# Patient Record
Sex: Male | Born: 1970 | Race: White | Hispanic: No | Marital: Married | Smoking: Current every day smoker
Health system: Southern US, Community
[De-identification: ages and names within clinical notes are randomized; demographics above are authoritative.]

## PROBLEM LIST (undated history)

## (undated) DIAGNOSIS — B1920 Unspecified viral hepatitis C without hepatic coma: Secondary | ICD-10-CM

## (undated) DIAGNOSIS — F1021 Alcohol dependence, in remission: Secondary | ICD-10-CM

## (undated) DIAGNOSIS — I4891 Unspecified atrial fibrillation: Secondary | ICD-10-CM

## (undated) DIAGNOSIS — I10 Essential (primary) hypertension: Secondary | ICD-10-CM

## (undated) DIAGNOSIS — F1010 Alcohol abuse, uncomplicated: Secondary | ICD-10-CM

## (undated) DIAGNOSIS — L299 Pruritus, unspecified: Secondary | ICD-10-CM

## (undated) DIAGNOSIS — R5383 Other fatigue: Secondary | ICD-10-CM

## (undated) DIAGNOSIS — J309 Allergic rhinitis, unspecified: Secondary | ICD-10-CM

## (undated) DIAGNOSIS — Z6824 Body mass index (BMI) 24.0-24.9, adult: Secondary | ICD-10-CM

## (undated) DIAGNOSIS — M549 Dorsalgia, unspecified: Secondary | ICD-10-CM

## (undated) DIAGNOSIS — N289 Disorder of kidney and ureter, unspecified: Secondary | ICD-10-CM

## (undated) HISTORY — DX: Body mass index (BMI) 24.0-24.9, adult: Z68.24

## (undated) HISTORY — DX: Unspecified atrial fibrillation: I48.91

## (undated) HISTORY — DX: Other fatigue: R53.83

## (undated) HISTORY — PX: MANDIBLE SURGERY: SHX707

## (undated) HISTORY — DX: Essential (primary) hypertension: I10

## (undated) HISTORY — DX: Alcohol dependence, in remission: F10.21

## (undated) HISTORY — DX: Pruritus, unspecified: L29.9

## (undated) HISTORY — DX: Allergic rhinitis, unspecified: J30.9

## (undated) HISTORY — DX: Unspecified viral hepatitis C without hepatic coma: B19.20

---

## 2003-10-11 ENCOUNTER — Ambulatory Visit (HOSPITAL_BASED_OUTPATIENT_CLINIC_OR_DEPARTMENT_OTHER): Admission: RE | Admit: 2003-10-11 | Discharge: 2003-10-11 | Payer: Self-pay | Admitting: Orthopedic Surgery

## 2012-04-20 ENCOUNTER — Emergency Department (HOSPITAL_COMMUNITY): Payer: Self-pay

## 2012-04-20 ENCOUNTER — Encounter (HOSPITAL_COMMUNITY): Payer: Self-pay | Admitting: *Deleted

## 2012-04-20 ENCOUNTER — Inpatient Hospital Stay (HOSPITAL_COMMUNITY)
Admission: EM | Admit: 2012-04-20 | Discharge: 2012-04-25 | DRG: 084 | Discharge status: Against Medical Advice | Payer: MEDICAID | Attending: General Surgery | Admitting: General Surgery

## 2012-04-20 DIAGNOSIS — F1410 Cocaine abuse, uncomplicated: Secondary | ICD-10-CM | POA: Diagnosis present

## 2012-04-20 DIAGNOSIS — S065XAA Traumatic subdural hemorrhage with loss of consciousness status unknown, initial encounter: Secondary | ICD-10-CM | POA: Diagnosis present

## 2012-04-20 DIAGNOSIS — S0240DA Maxillary fracture, left side, initial encounter for closed fracture: Secondary | ICD-10-CM | POA: Diagnosis present

## 2012-04-20 DIAGNOSIS — IMO0002 Reserved for concepts with insufficient information to code with codable children: Secondary | ICD-10-CM

## 2012-04-20 DIAGNOSIS — S02109A Fracture of base of skull, unspecified side, initial encounter for closed fracture: Secondary | ICD-10-CM | POA: Diagnosis present

## 2012-04-20 DIAGNOSIS — S06369A Traumatic hemorrhage of cerebrum, unspecified, with loss of consciousness of unspecified duration, initial encounter: Secondary | ICD-10-CM

## 2012-04-20 DIAGNOSIS — S065X9A Traumatic subdural hemorrhage with loss of consciousness of unspecified duration, initial encounter: Secondary | ICD-10-CM

## 2012-04-20 DIAGNOSIS — S0636AA Traumatic hemorrhage of cerebrum, unspecified, with loss of consciousness status unknown, initial encounter: Secondary | ICD-10-CM | POA: Diagnosis present

## 2012-04-20 DIAGNOSIS — W19XXXA Unspecified fall, initial encounter: Secondary | ICD-10-CM | POA: Diagnosis present

## 2012-04-20 DIAGNOSIS — S06309A Unspecified focal traumatic brain injury with loss of consciousness of unspecified duration, initial encounter: Principal | ICD-10-CM | POA: Diagnosis present

## 2012-04-20 DIAGNOSIS — S0280XA Fracture of other specified skull and facial bones, unspecified side, initial encounter for closed fracture: Secondary | ICD-10-CM | POA: Diagnosis present

## 2012-04-20 DIAGNOSIS — S0285XA Fracture of orbit, unspecified, initial encounter for closed fracture: Secondary | ICD-10-CM

## 2012-04-20 DIAGNOSIS — F1010 Alcohol abuse, uncomplicated: Secondary | ICD-10-CM | POA: Diagnosis present

## 2012-04-20 DIAGNOSIS — S066X9A Traumatic subarachnoid hemorrhage with loss of consciousness of unspecified duration, initial encounter: Secondary | ICD-10-CM

## 2012-04-20 DIAGNOSIS — S022XXA Fracture of nasal bones, initial encounter for closed fracture: Secondary | ICD-10-CM | POA: Diagnosis present

## 2012-04-20 HISTORY — DX: Alcohol abuse, uncomplicated: F10.10

## 2012-04-20 HISTORY — DX: Dorsalgia, unspecified: M54.9

## 2012-04-20 LAB — PROTIME-INR: Prothrombin Time: 13 seconds (ref 11.6–15.2)

## 2012-04-20 LAB — RAPID URINE DRUG SCREEN, HOSP PERFORMED
Benzodiazepines: NOT DETECTED
Cocaine: POSITIVE — AB
Opiates: NOT DETECTED

## 2012-04-20 LAB — CBC
HCT: 46 % (ref 39.0–52.0)
Hemoglobin: 16.1 g/dL (ref 13.0–17.0)
MCH: 30.3 pg (ref 26.0–34.0)
MCHC: 35 g/dL (ref 30.0–36.0)
MCV: 86.5 fL (ref 78.0–100.0)

## 2012-04-20 LAB — BASIC METABOLIC PANEL
BUN: 7 mg/dL (ref 6–23)
Calcium: 9.2 mg/dL (ref 8.4–10.5)
Creatinine, Ser: 1.04 mg/dL (ref 0.50–1.35)
GFR calc non Af Amer: 86 mL/min — ABNORMAL LOW (ref >90)
Glucose, Bld: 93 mg/dL (ref 70–99)

## 2012-04-20 LAB — ETHANOL: Alcohol, Ethyl (B): 127 mg/dL — ABNORMAL HIGH (ref 0–11)

## 2012-04-20 MED ORDER — VITAMIN B-1 100 MG PO TABS
100.0000 mg | ORAL_TABLET | Freq: Every day | ORAL | Status: DC
  Administered 2012-04-21 – 2012-04-25 (×5): 100 mg via ORAL
  Filled 2012-04-20 (×5): qty 1

## 2012-04-20 MED ORDER — ONDANSETRON HCL 4 MG PO TABS: 4.0000 mg | ORAL_TABLET | Freq: Four times a day (QID) | ORAL | Status: DC | PRN

## 2012-04-20 MED ORDER — LORAZEPAM 2 MG/ML IJ SOLN: 1.0000 mg | Freq: Four times a day (QID) | INTRAMUSCULAR | Status: AC | PRN

## 2012-04-20 MED ORDER — THIAMINE HCL 100 MG/ML IJ SOLN
100.0000 mg | Freq: Every day | INTRAMUSCULAR | Status: DC
  Filled 2012-04-20 (×2): qty 1

## 2012-04-20 MED ORDER — PANTOPRAZOLE SODIUM 40 MG IV SOLR
40.0000 mg | Freq: Every day | INTRAVENOUS | Status: DC
  Filled 2012-04-20 (×2): qty 40

## 2012-04-20 MED ORDER — ONDANSETRON HCL 4 MG/2ML IJ SOLN
4.0000 mg | Freq: Four times a day (QID) | INTRAMUSCULAR | Status: DC | PRN
  Administered 2012-04-21 – 2012-04-23 (×4): 4 mg via INTRAVENOUS
  Filled 2012-04-20 (×4): qty 2

## 2012-04-20 MED ORDER — HYDROMORPHONE HCL PF 1 MG/ML IJ SOLN
0.5000 mg | INTRAMUSCULAR | Status: DC | PRN
Start: 2012-04-20 — End: 2012-04-25
  Administered 2012-04-21 – 2012-04-24 (×2): 0.5 mg via INTRAVENOUS
  Filled 2012-04-20 (×2): qty 1

## 2012-04-20 MED ORDER — HYDROMORPHONE HCL PF 1 MG/ML IJ SOLN
1.0000 mg | INTRAMUSCULAR | Status: DC | PRN
  Administered 2012-04-20 – 2012-04-25 (×30): 1 mg via INTRAVENOUS
  Filled 2012-04-20 (×30): qty 1

## 2012-04-20 MED ORDER — LORAZEPAM 1 MG PO TABS
0.0000 mg | ORAL_TABLET | Freq: Four times a day (QID) | ORAL | Status: AC
  Administered 2012-04-21: 1 mg via ORAL

## 2012-04-20 MED ORDER — TETANUS-DIPHTH-ACELL PERTUSSIS 5-2.5-18.5 LF-MCG/0.5 IM SUSP
0.5000 mL | Freq: Once | INTRAMUSCULAR | Status: AC
  Administered 2012-04-20: 0.5 mL via INTRAMUSCULAR
  Filled 2012-04-20: qty 0.5

## 2012-04-20 MED ORDER — ADULT MULTIVITAMIN W/MINERALS CH
1.0000 | ORAL_TABLET | Freq: Every day | ORAL | Status: DC
  Administered 2012-04-21 – 2012-04-25 (×5): 1 via ORAL
  Filled 2012-04-20 (×5): qty 1

## 2012-04-20 MED ORDER — FENTANYL CITRATE 0.05 MG/ML IJ SOLN
50.0000 ug | Freq: Once | INTRAMUSCULAR | Status: AC
  Administered 2012-04-20: 50 ug via INTRAVENOUS
  Filled 2012-04-20: qty 2

## 2012-04-20 MED ORDER — POTASSIUM CHLORIDE IN NACL 20-0.9 MEQ/L-% IV SOLN
INTRAVENOUS | Status: DC
  Administered 2012-04-21 (×2): via INTRAVENOUS
  Administered 2012-04-21: 125 mL/h via INTRAVENOUS
  Administered 2012-04-22 – 2012-04-23 (×4): via INTRAVENOUS
  Filled 2012-04-20 (×9): qty 1000

## 2012-04-20 MED ORDER — SODIUM CHLORIDE 0.9 % IV BOLUS (SEPSIS)
1000.0000 mL | Freq: Once | INTRAVENOUS | Status: AC
  Administered 2012-04-20: 1000 mL via INTRAVENOUS

## 2012-04-20 MED ORDER — LORAZEPAM 1 MG PO TABS
1.0000 mg | ORAL_TABLET | Freq: Four times a day (QID) | ORAL | Status: AC | PRN
  Filled 2012-04-20: qty 1

## 2012-04-20 MED ORDER — LORAZEPAM 1 MG PO TABS
0.0000 mg | ORAL_TABLET | Freq: Two times a day (BID) | ORAL | Status: AC
  Administered 2012-04-23: 1 mg via ORAL
  Administered 2012-04-24: 2 mg via ORAL
  Filled 2012-04-20: qty 2
  Filled 2012-04-20: qty 1

## 2012-04-20 MED ORDER — PANTOPRAZOLE SODIUM 40 MG PO TBEC
40.0000 mg | DELAYED_RELEASE_TABLET | Freq: Every day | ORAL | Status: DC
  Administered 2012-04-21 – 2012-04-25 (×5): 40 mg via ORAL
  Filled 2012-04-20 (×5): qty 1

## 2012-04-20 MED ORDER — FOLIC ACID 1 MG PO TABS
1.0000 mg | ORAL_TABLET | Freq: Every day | ORAL | Status: DC
  Administered 2012-04-21 – 2012-04-25 (×5): 1 mg via ORAL
  Filled 2012-04-20 (×5): qty 1

## 2012-04-20 NOTE — ED Notes (Signed)
MD at bedside. 
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
## 2012-04-20 NOTE — ED Notes (Signed)
Dr Janee Morn here to see

## 2012-04-20 NOTE — ED Notes (Signed)
Dr Mikal Plane here to see.  Pt alert he does not want to stay.   Alert oriented skin warm and dry he moves all extremities vitals good
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
## 2012-04-20 NOTE — ED Provider Notes (Signed)
I saw and evaluated the patient, reviewed the resident's note and I agree with the findings and plan.   Dione Booze, MD 04/20/12 [phone]
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
## 2012-04-20 NOTE — Progress Notes (Signed)
Pt admitted to 3100 

## 2012-04-20 NOTE — ED Notes (Signed)
Dilaudid given for a headahce

## 2012-04-20 NOTE — ED Notes (Signed)
Pt sleeping before the dilaudid was given.  Easily arrousable with minimal effort

## 2012-04-20 NOTE — ED Notes (Signed)
Pupils = and react to l;ight  approx size 3.0.  Pt c/o a headache

## 2012-04-20 NOTE — ED Notes (Signed)
Report called to 3100 

## 2012-04-20 NOTE — ED Notes (Signed)
Patient transported to CT 

## 2012-04-20 NOTE — ED Notes (Signed)
Pt. Removed C-Collar and refused to wear it.

## 2012-04-21 ENCOUNTER — Inpatient Hospital Stay (HOSPITAL_COMMUNITY): Payer: Self-pay

## 2012-04-21 DIAGNOSIS — F1090 Alcohol use, unspecified, uncomplicated: Secondary | ICD-10-CM

## 2012-04-21 DIAGNOSIS — S06369A Traumatic hemorrhage of cerebrum, unspecified, with loss of consciousness of unspecified duration, initial encounter: Secondary | ICD-10-CM | POA: Diagnosis present

## 2012-04-21 DIAGNOSIS — Z7289 Other problems related to lifestyle: Secondary | ICD-10-CM | POA: Insufficient documentation

## 2012-04-21 DIAGNOSIS — F1490 Cocaine use, unspecified, uncomplicated: Secondary | ICD-10-CM

## 2012-04-21 DIAGNOSIS — W19XXXA Unspecified fall, initial encounter: Secondary | ICD-10-CM | POA: Diagnosis present

## 2012-04-21 DIAGNOSIS — S0240DA Maxillary fracture, left side, initial encounter for closed fracture: Secondary | ICD-10-CM

## 2012-04-21 DIAGNOSIS — S065X9A Traumatic subdural hemorrhage with loss of consciousness of unspecified duration, initial encounter: Secondary | ICD-10-CM | POA: Diagnosis present

## 2012-04-21 DIAGNOSIS — S0285XA Fracture of orbit, unspecified, initial encounter for closed fracture: Secondary | ICD-10-CM

## 2012-04-21 DIAGNOSIS — S065XAA Traumatic subdural hemorrhage with loss of consciousness status unknown, initial encounter: Secondary | ICD-10-CM | POA: Diagnosis present

## 2012-04-21 DIAGNOSIS — S0636AA Traumatic hemorrhage of cerebrum, unspecified, with loss of consciousness status unknown, initial encounter: Secondary | ICD-10-CM

## 2012-04-21 HISTORY — DX: Traumatic hemorrhage of cerebrum, unspecified, with loss of consciousness status unknown, initial encounter: S06.36AA

## 2012-04-21 HISTORY — DX: Traumatic subdural hemorrhage with loss of consciousness status unknown, initial encounter: S06.5XAA

## 2012-04-21 HISTORY — DX: Alcohol use, unspecified, uncomplicated: F10.90

## 2012-04-21 HISTORY — DX: Cocaine use, unspecified, uncomplicated: F14.90

## 2012-04-21 HISTORY — DX: Unspecified fall, initial encounter: W19.XXXA

## 2012-04-21 HISTORY — DX: Fracture of orbit, unspecified, initial encounter for closed fracture: S02.85XA

## 2012-04-21 HISTORY — DX: Maxillary fracture, left side, initial encounter for closed fracture: S02.40DA

## 2012-04-21 LAB — BASIC METABOLIC PANEL
BUN: 7 mg/dL (ref 6–23)
CO2: 23 mEq/L (ref 19–32)
Chloride: 103 mEq/L (ref 96–112)
Creatinine, Ser: 0.84 mg/dL (ref 0.50–1.35)
GFR calc Af Amer: >90 mL/min (ref >90)
Potassium: 4.4 mEq/L (ref 3.5–5.1)

## 2012-04-21 LAB — CBC
HCT: 41.6 % (ref 39.0–52.0)
Hemoglobin: 13.9 g/dL (ref 13.0–17.0)
MCHC: 33.4 g/dL (ref 30.0–36.0)
MCV: 87.6 fL (ref 78.0–100.0)

## 2012-04-21 MED ORDER — PNEUMOCOCCAL VAC POLYVALENT 25 MCG/0.5ML IJ INJ
0.5000 mL | INJECTION | INTRAMUSCULAR | Status: AC
  Administered 2012-04-22: 0.5 mL via INTRAMUSCULAR
  Filled 2012-04-21: qty 0.5

## 2012-04-21 MED ORDER — BIOTENE DRY MOUTH MT LIQD
15.0000 mL | Freq: Two times a day (BID) | OROMUCOSAL | Status: DC
  Administered 2012-04-22 – 2012-04-24 (×6): 15 mL via OROMUCOSAL

## 2012-04-21 MED ORDER — PNEUMOCOCCAL VAC POLYVALENT 25 MCG/0.5ML IJ INJ
0.5000 mL | INJECTION | INTRAMUSCULAR | Status: DC
  Filled 2012-04-21: qty 0.5

## 2012-04-21 MED ORDER — CHLORHEXIDINE GLUCONATE 0.12 % MT SOLN
15.0000 mL | Freq: Two times a day (BID) | OROMUCOSAL | Status: DC
  Administered 2012-04-21 – 2012-04-24 (×7): 15 mL via OROMUCOSAL
  Filled 2012-04-21 (×10): qty 15
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
## 2012-04-21 NOTE — Progress Notes (Signed)
BP 128/71  Pulse 61  Temp(Src) 98.6 F (37 C) (Oral)  Resp 16  Ht 5\' 8"  (1.727 m)  Wt 73.6 kg (162 lb 4.1 oz)  BMI 24.67 kg/m2  SpO2 97% Alert and oriented x 4. Speech clear and fluent. Peerl, full eom Symmetric facies, tongue and uvula midline Normal shoulder shrug Moving all extremities CTscan unchanged. Exam is stable. Headache is to be expected.

## 2012-04-21 NOTE — Progress Notes (Signed)
AM CT done
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
## 2012-04-22 NOTE — Progress Notes (Signed)
PT Cancellation Note  Treatment cancelled today due to medical issues with patient which prohibited therapy.  Pt currently on bedrest.  Please update activity orders when appropriate.  Thanks!!  Eoghan Belcher 04/22/2012, 7:37 AM Jake Shark, PT DPT [phone]
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
## 2012-04-22 NOTE — Progress Notes (Signed)
SLP Cancellation Note  Attempt x1 for completion of Cognitive Linguistic Evaluation but not completed secondary to patient's LOA and inability to participate fully. ST to re attempt next date.  Moreen Fowler MS, CCC-SLP [phone]  [facility] 04/22/2012, 11:52 AM

## 2012-04-22 NOTE — Progress Notes (Signed)
Stable. No weakness. Vs wnl. Plan repeat ct head

## 2012-04-22 NOTE — Progress Notes (Signed)
Subjective: Pt c/o headache. No visual change/loss.  Objective: Vital signs in last 24 hours: Temp:  [98.2 F (36.8 C)-98.6 F (37 C)] 98.4 F (36.9 C) (05/18 0800) Pulse Rate:  [53-69] 54  (05/18 1000) Resp:  [13-19] 13  (05/18 1000) BP: (107-147)/(61-99) 123/84 mmHg (05/18 1000) SpO2:  [95 %-99 %] 97 % (05/18 1000) Weight:  [75.4 kg (166 lb 3.6 oz)] 75.4 kg (166 lb 3.6 oz) (05/18 0422)  Physical Exam  Alert, oriented x 3, speech fluent. Multiple abrasions on face. Left periorbital edema with ecchymoses. No palpable bony step off. Unable to fully open left eye.  Perrl, full eom. No diplopia or enophthalmus. Vision grossly intact bilaterally.  CN 5 and 7 intact bilaterally.  Bilateral auricle and TM wnl. Tongue and uvula midline  No neck lesion.    Basename 04/21/12 0358 04/20/12 1839  WBC 18.6* 16.3*  HGB 13.9 16.1  HCT 41.6 46.0  PLT 296 302    Basename 04/21/12 0358 04/20/12 1839  NA 136 138  K 4.4 3.8  CL 103 102  CO2 23 22  GLUCOSE 129* 93  BUN 7 7  CREATININE 0.84 1.04  CALCIUM 8.7 9.2    Medications:  I have reviewed the patient's current medications. Scheduled:   . antiseptic oral rinse  15 mL Mouth Rinse q12n4p  . chlorhexidine  15 mL Mouth Rinse BID  . folic acid  1 mg Oral Daily  . LORazepam  0-4 mg Oral Q6H   Followed by  . LORazepam  0-4 mg Oral Q12H  . mulitivitamin with minerals  1 tablet Oral Daily  . pantoprazole  40 mg Oral Q1200  . pneumococcal 23 valent vaccine  0.5 mL Intramuscular Tomorrow-1000  . thiamine  100 mg Oral Daily  . DISCONTD: pantoprazole (PROTONIX) IV  40 mg Intravenous Q1200  . DISCONTD: thiamine  100 mg Intravenous Daily    Assessment/Plan: Nondisplaced fractures of the left forehead, the left orbital walls, and the left maxillary sinus. No significant functional deficits. No diplopia, enophthalmus, or visual change. Will continue with conservative observation.     LOS: 2 days   Lillah Standre,SUI W 04/22/2012, 11:05  AM

## 2012-04-23 ENCOUNTER — Inpatient Hospital Stay (HOSPITAL_COMMUNITY): Payer: Self-pay

## 2012-04-23 MED ORDER — ACETAMINOPHEN 325 MG PO TABS: 650.0000 mg | ORAL_TABLET | ORAL | Status: DC | PRN

## 2012-04-23 MED ORDER — OXYCODONE-ACETAMINOPHEN 5-325 MG PO TABS
1.0000 | ORAL_TABLET | ORAL | Status: DC | PRN
  Administered 2012-04-23 – 2012-04-25 (×9): 2 via ORAL
  Filled 2012-04-23 (×10): qty 2

## 2012-04-23 NOTE — Progress Notes (Signed)
Patient ID: Tyler [name], male   DOB: June 16, 1971, 41 y.o.   MRN: [id number] Stable, mentally better,vs wnl. Plan ct head in am
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
## 2012-04-23 NOTE — Progress Notes (Signed)
SLP Cancellation Note  ST attempt x1 to complete Cognitive Linguistic Evaluation but not completed secondary to patient's LOA and refusal to participate. ST to address on 04/24/12. Moreen Fowler MS, CCC-SLP [phone]  [facility] 04/23/2012, 3:15 PM

## 2012-04-24 MED ORDER — NICOTINE 21 MG/24HR TD PT24
21.0000 mg | MEDICATED_PATCH | Freq: Every day | TRANSDERMAL | Status: AC
  Administered 2012-04-24: 21 mg via TRANSDERMAL
  Filled 2012-04-24: qty 1

## 2012-04-24 MED ORDER — POTASSIUM CHLORIDE IN NACL 20-0.9 MEQ/L-% IV SOLN
INTRAVENOUS | Status: DC
  Filled 2012-04-24: qty 1000

## 2012-04-24 MED ORDER — NICOTINE 21 MG/24HR TD PT24
21.0000 mg | MEDICATED_PATCH | TRANSDERMAL | Status: AC
  Filled 2012-04-24: qty 1
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
## 2012-04-24 NOTE — Progress Notes (Signed)
Physical Therapy Treatment Patient Details Name: Tyler [name] MRN: [id number] DOB: Feb 09, 1971 Today's Date: 04/24/2012 Time: 1130-1150 PT Time Calculation (min): 20 min  PT Assessment / Plan / Recommendation Comments on Treatment Session  Pt able to increase ambulation distance however presenting as Rancho Level IV agitated and confused.  Pt needs constant redirection to task and overall mobility due to poor attention and safety awareness.  Highly recommend CIR at this time.  Decrease frequency to 3x per week due to agitation.    Follow Up Recommendations  Inpatient Rehab    Barriers to Discharge        Equipment Recommendations  Defer to next venue    Recommendations for Other Services Rehab consult  Frequency Min 3X/week   Plan Discharge plan needs to be updated;Frequency remains appropriate    Precautions / Restrictions Precautions Precautions: Fall   Pertinent Vitals/Pain C/o headache but unable to rate; not time for pain medication just given    Mobility  Bed Mobility Bed Mobility: Supine to Sit;Sit to Supine Supine to Sit: 4: Min assist;HOB elevated Sit to Supine: 4: Min assist;HOB flat Details for Bed Mobility Assistance: (A) with tactile cues and manual for task.  Pt attempts to return to bed after pt requested to get OOB Transfers Transfers: Sit to Stand;Stand to Sit Sit to Stand: 1: +2 Total assist;From bed Sit to Stand: Patient Percentage: 70% Stand to Sit: 1: +2 Total assist;To bed Stand to Sit: Patient Percentage: 70% Details for Transfer Assistance: +2 assistance for safety due to impulsive and agitated;  Pt needs constant cues for safety due to poor safety awareness as well as poor attention to task.  Needs constant cues for redirections Ambulation/Gait Ambulation/Gait Assistance: 1: +2 Total assist Ambulation/Gait: Patient Percentage: 70% Ambulation Distance (Feet): 150 Feet Assistive device: 2 person hand held assist Ambulation/Gait Assistance Details:  +2 assist for safety due to pt agitated at times and wanting to leave;  Needs tactile cues to navigate and directional change. Gait Pattern: Ataxic Stairs: No    Exercises     PT Diagnosis:    PT Problem List:   PT Treatment Interventions:     PT Goals Acute Rehab PT Goals PT Goal Formulation: Patient unable to participate in goal setting Time For Goal Achievement: 05/06/12 Potential to Achieve Goals: Good Pt will go Supine/Side to Sit: with modified independence PT Goal: Supine/Side to Sit - Progress: Progressing toward goal Pt will Sit at Edge of Bed: Independently;3-5 min PT Goal: Sit at Edge Of Bed - Progress: Progressing toward goal Pt will go Sit to Supine/Side: with modified independence PT Goal: Sit to Supine/Side - Progress: Progressing toward goal Pt will go Sit to Stand: with modified independence PT Goal: Sit to Stand - Progress: Progressing toward goal Pt will go Stand to Sit: with modified independence PT Goal: Stand to Sit - Progress: Progressing toward goal Pt will Transfer Bed to Chair/Chair to Bed: with modified independence PT Transfer Goal: Bed to Chair/Chair to Bed - Progress: Progressing toward goal Pt will Stand: with modified independence;3 - 5 min PT Goal: Stand - Progress: Progressing toward goal Pt will Ambulate: >150 feet;with modified independence;with least restrictive assistive device PT Goal: Ambulate - Progress: Progressing toward goal  Visit Information  Last PT Received On: 04/24/12 Assistance Needed: +2    Subjective Data      Cognition  Overall Cognitive Status: Impaired Area of Impairment: Attention;Memory;Following commands;Safety/judgement;Awareness of errors;Problem solving;Awareness of deficits Arousal/Alertness: Awake/alert Orientation Level: Disoriented X4 Behavior During Session:  Agitated (restless) Current Attention Level: Focused Attention - Other Comments: Pt continues to be easily distracted and fixates on the need for  pain medication Memory: Decreased recall of precautions Memory Deficits: Pt with short term deficits and unable to recall room number after given and location "[place]".  Following Commands: Follows one step commands inconsistently;Follows one step commands with increased time Safety/Judgement: Decreased safety judgement for tasks assessed;Impulsive;Decreased awareness of need for assistance Safety/Judgement - Other Comments: does not attend to lines, stands unassisted with poor stability and inablity to recognize unsafe situation Awareness of Errors: Assistance required to identify errors made;Assistance required to correct errors made    Balance  Balance Balance Assessed: Yes Static Sitting Balance Static Sitting - Balance Support: Feet supported Static Sitting - Level of Assistance: 5: Stand by assistance Static Sitting - Comment/# of Minutes: stand by assist for safety  End of Session PT - End of Session Equipment Utilized During Treatment:  (Used Posey belt as gait belt due to pt agitated with belt) Activity Tolerance: Patient limited by fatigue;Patient limited by pain Patient left: in bed;with call bell/phone within reach;with restraints reapplied (speech therapist present) Nurse Communication: Mobility status    Tyler [name] 04/24/2012, 1:30 PM
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
## 2012-04-24 NOTE — Progress Notes (Signed)
Trauma Service Note  Subjective: Patient is trying to leave AMA.  Wants to go get some chewing tobacco.  His CT head last done shows significant worsening, but does not require surgery.  Objective: Vital signs in last 24 hours: Temp:  [98.4 F (36.9 C)-99.1 F (37.3 C)] 99 F (37.2 C) (05/20 0400) Pulse Rate:  [55-82] 82  (05/20 0800) Resp:  [13-19] 13  (05/20 0800) BP: (118-170)/(60-103) 128/91 mmHg (05/20 0800) SpO2:  [95 %-99 %] 98 % (05/20 0800) Weight:  [70.7 kg (155 lb 13.8 oz)] 70.7 kg (155 lb 13.8 oz) (05/20 0400) Last BM Date: 04/22/12  Intake/Output from previous day: 05/19 0701 - 05/20 0700 In: 1600 [P.O.:420; I.V.:1180] Out: -  Intake/Output this shift: Total I/O In: 110 [P.O.:60; I.V.:50] Out: -   General: Sitting in chair, not very agitated, but trying to get up and leave.  Lungs: Clear  Abd: Benign.  Extremities: No changes  Neuro: Unsteady on his feet according to staff.  Will continue PT/OT  Lab Results:  PT/INR No results found for this basename: LABPROT:2,INR:2 in the last 72 hours ABG No results found for this basename: PHART:2,PCO2:2,PO2:2,HCO3:2 in the last 72 hours  Studies/Results: Ct Head Wo Contrast  04/23/2012  *RADIOLOGY REPORT*  Clinical Data: Closed head injury.  CT HEAD WITHOUT CONTRAST  Technique:  Contiguous axial images were obtained from the base of the skull through the vertex without contrast.  Comparison: CT 04/21/2012  Findings: Hemorrhagic contusion in the right posterior temporal lobe appears slightly larger with increased low density surrounding edema.  Small hemorrhagic contusion right posterior frontal lobe is unchanged in size however the surrounding low density edema is increased.  Extensive subdural hematoma in the right frontal parietal and temporal lobe is similar to the prior study.  There is also subdural blood along the tentorium on the right extending into the interhemispheric fissure.  Small left-sided subdural  hematoma and small left temporal hemorrhagic contusion, unchanged.  6 mm midline shift to the left has increased slightly.  Ventricles are not enlarged. Fluid is present in the sinuses.  Nondisplaced fracture left parietal bone just behind the coronal suture, extending into the superior orbit.  IMPRESSION: Bilateral subdural hematomas, right greater than left are similar.  Increased edema surrounding hemorrhagic contusions in the right frontal lobe and right temporal lobe.  Slight increase in midline shift, now measuring 6 mm toward the left.  Original Report Authenticated By: Camelia Phenes, M.D.    Anti-infectives: Anti-infectives    None      Assessment/Plan: s/p  Advance diet Transfer to the floor. Keep sitter, Nicotine patch. TKO IVF Work on disposition.  LOS: 4 days   Marta Lamas. Gae Bon, MD, FACS [phone] Trauma Surgeon 04/24/2012

## 2012-04-24 NOTE — Progress Notes (Signed)
OT Cancellation Note  Treatment cancelled today due to pt. sleeping soundly, and unable to arrouse despite max cues/attempts.  Will re-attempt tomorrow.  Boykin Reaper [phone] 04/24/2012, 4:37 PM

## 2012-04-24 NOTE — Progress Notes (Signed)
Patient ID: Tyler [name], male   DOB: 03-19-1971, 41 y.o.   MRN: [id number] BP 128/91  Pulse 82  Temp(Src) 99 F (37.2 C) (Oral)  Resp 13  Ht 5\' 8"  (1.727 m)  Wt 70.7 kg (155 lb 13.8 oz)  BMI 23.70 kg/m2  SpO2 98% Alert, agitated, confused Follows some commands Moves all extremities Can be transferred Ct ok, no real change

## 2012-04-24 NOTE — Progress Notes (Signed)
Pt. Uncooperative, trying to get out of bed. Pt is unsteady on feet. MD made aware, security called to assist. Pt placed back in bed and a soft waist belt was applied.

## 2012-04-24 NOTE — Consult Note (Signed)
Made aware of competency/capacity consult for patient who is not medically stable to leave, but verbalizing and insisting on leaving AMA.  CSW with psych services attempted twice to see patient and assess situation, deescalate behaviors and assess for capacity.  At this time patient is sleeping face down in bed and has been that way since he transferred from 3100.  Sitter in place and reports no problems at this time.  RN reports patient received medication to calm him down and has not woken up or attempted to leave AMA.  At this time patient is not a danger to himself nor a flight risk, thus not under commitment.  Discussed with trauma MD and team, will re-attempt to see patient tomorrow.  Ashley Jacobs, MSW LCSW [phone]

## 2012-04-25 MED ORDER — NICOTINE 21 MG/24HR TD PT24
21.0000 mg | MEDICATED_PATCH | Freq: Every day | TRANSDERMAL | Status: DC
  Administered 2012-04-25: 21 mg via TRANSDERMAL
  Filled 2012-04-25: qty 1

## 2012-04-25 NOTE — Progress Notes (Signed)
UR complete 
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
## 2012-04-25 NOTE — Progress Notes (Addendum)
Called to pt.'s room d/t pt. Wanting to leave. This RN paged Earney Hamburg, PA who notified this RN that since pt. Is competent pt. May leave AMA but if he wanted to wait that he would come up and discuss his medications with him. This was fully explained to pt. And the pt. Did not want to wait. Leaving against medical advice was fully explained to this patient by this RN and patient willingly chose to sign against medical advice paperwork. This RN witnessed patient sign AMA form as did the patient's safety sitter- Josefina Do. This RN also went to security office to obtain patient's valuables-knife, cigarettes, chewing tobacco and lighter. By the time this RN came back to the unit the patient had elected to leave the unit before obtaining his valuables. This RN attempted to return valuables to security but security refused to take them. Valuables were given to dept AD-Allena Day, RN to be locked up. This RN called and left a message at patient's home [phone]) that valuables were on [address] and that he may come back and pick them up here. This was witnessed by Deland Pretty, Consulting civil engineer.   Earney Hamburg, PA came by right after pt. Left and was made aware that patient did decide to leave AMA.
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
## 2012-04-25 NOTE — Progress Notes (Signed)
Subjective: Pt is more awake and alert today.  His headache has improved.  Objective: Vital signs in last 24 hours: Temp:  [97.4 F (36.3 C)-99 F (37.2 C)] 97.4 F (36.3 C) (05/21 0947) Pulse Rate:  [76-97] 97  (05/21 0947) Resp:  [18-19] 18  (05/21 0947) BP: (113-154)/(74-92) 154/81 mmHg (05/21 0947) SpO2:  [95 %-98 %] 97 % (05/21 0947)  Physical Exam  Responsive, A & O x3. Multiple abrasions on face. Left periorbital edema with ecchymoses significantly improved.  No palpable bony step off. Able to fully open left eye.  Perrl, full eom. Vision intact bilaterally. CN 5 and 7 intact bilaterally.  Bilateral auricle and TM wnl.  Tongue and uvula midline  No neck lesion.   Medications:  I have reviewed the patient's current medications. Scheduled:   . antiseptic oral rinse  15 mL Mouth Rinse q12n4p  . chlorhexidine  15 mL Mouth Rinse BID  . folic acid  1 mg Oral Daily  . LORazepam  0-4 mg Oral Q12H  . mulitivitamin with minerals  1 tablet Oral Daily  . nicotine  21 mg Transdermal Daily  . nicotine  21 mg Transdermal Q24H  . nicotine  21 mg Transdermal Daily  . pantoprazole  40 mg Oral Q1200  . thiamine  100 mg Oral Daily    Assessment/Plan: Nondisplaced fractures of the left forehead, the left orbital walls, and the left maxillary sinus. No significant functional deficits. Vision intact. No diplopia. Pt may f/u with me as an outpatient. Pt may call my office for an appointment in 1-2 weeks.   LOS: 5 days   Cayla Wiegand,SUI W 04/25/2012, 11:17 AM
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
## 2012-04-26 NOTE — Discharge Summary (Signed)
This patient has been seen and I agree with the findings and treatment plan.  Ana Liaw O. Avin Gibbons, III, MD, FACS [phone] (pager) [phone] (direct pager) Trauma Surgeon  

## 2014-10-07 ENCOUNTER — Encounter (HOSPITAL_COMMUNITY)
Admission: EM | Disposition: A | Discharge status: Home / Self Care | Payer: Self-pay | Source: Home / Self Care | Attending: Emergency Medicine

## 2014-10-07 ENCOUNTER — Emergency Department (HOSPITAL_COMMUNITY): Payer: Medicaid Other

## 2014-10-07 ENCOUNTER — Ambulatory Visit (HOSPITAL_COMMUNITY)
Admission: EM | Admit: 2014-10-07 | Discharge: 2014-10-07 | Disposition: A | Discharge status: Home / Self Care | Payer: Medicaid Other | Attending: Emergency Medicine | Admitting: Emergency Medicine

## 2014-10-07 ENCOUNTER — Encounter (HOSPITAL_COMMUNITY): Payer: Self-pay | Admitting: Nurse Practitioner

## 2014-10-07 ENCOUNTER — Emergency Department (HOSPITAL_COMMUNITY): Payer: Medicaid Other | Admitting: Certified Registered Nurse Anesthetist

## 2014-10-07 ENCOUNTER — Other Ambulatory Visit: Payer: Self-pay | Admitting: Orthopedic Surgery

## 2014-10-07 DIAGNOSIS — R000 Tachycardia, unspecified: Secondary | ICD-10-CM | POA: Diagnosis not present

## 2014-10-07 DIAGNOSIS — W298XXA Contact with other powered powered hand tools and household machinery, initial encounter: Secondary | ICD-10-CM | POA: Diagnosis not present

## 2014-10-07 DIAGNOSIS — Y93H3 Activity, building and construction: Secondary | ICD-10-CM | POA: Insufficient documentation

## 2014-10-07 DIAGNOSIS — S6422XA Injury of radial nerve at wrist and hand level of left arm, initial encounter: Secondary | ICD-10-CM | POA: Diagnosis not present

## 2014-10-07 DIAGNOSIS — Y9289 Other specified places as the place of occurrence of the external cause: Secondary | ICD-10-CM | POA: Insufficient documentation

## 2014-10-07 DIAGNOSIS — Y998 Other external cause status: Secondary | ICD-10-CM | POA: Insufficient documentation

## 2014-10-07 DIAGNOSIS — S66822A Laceration of other specified muscles, fascia and tendons at wrist and hand level, left hand, initial encounter: Secondary | ICD-10-CM | POA: Insufficient documentation

## 2014-10-07 DIAGNOSIS — F1099 Alcohol use, unspecified with unspecified alcohol-induced disorder: Secondary | ICD-10-CM | POA: Insufficient documentation

## 2014-10-07 DIAGNOSIS — F17210 Nicotine dependence, cigarettes, uncomplicated: Secondary | ICD-10-CM | POA: Diagnosis not present

## 2014-10-07 DIAGNOSIS — IMO0002 Reserved for concepts with insufficient information to code with codable children: Secondary | ICD-10-CM

## 2014-10-07 DIAGNOSIS — I10 Essential (primary) hypertension: Secondary | ICD-10-CM | POA: Diagnosis not present

## 2014-10-07 DIAGNOSIS — S62512B Displaced fracture of proximal phalanx of left thumb, initial encounter for open fracture: Secondary | ICD-10-CM | POA: Insufficient documentation

## 2014-10-07 DIAGNOSIS — S56322A Laceration of extensor or abductor muscles, fascia and tendons of left thumb at forearm level, initial encounter: Secondary | ICD-10-CM | POA: Diagnosis present

## 2014-10-07 DIAGNOSIS — S68022A Partial traumatic metacarpophalangeal amputation of left thumb, initial encounter: Secondary | ICD-10-CM

## 2014-10-07 HISTORY — DX: Disorder of kidney and ureter, unspecified: N28.9

## 2014-10-07 HISTORY — PX: NERVE, TENDON AND ARTERY REPAIR: SHX5695

## 2014-10-07 HISTORY — DX: Essential (primary) hypertension: I10

## 2014-10-07 LAB — CBC WITH DIFFERENTIAL/PLATELET
BASOS ABS: 0 10*3/uL (ref 0.0–0.1)
BASOS PCT: 0 % (ref 0–1)
EOS ABS: 0.1 10*3/uL (ref 0.0–0.7)
EOS PCT: 1 % (ref 0–5)
HEMATOCRIT: 46.3 % (ref 39.0–52.0)
Hemoglobin: 15.6 g/dL (ref 13.0–17.0)
LYMPHS PCT: 15 % (ref 12–46)
Lymphs Abs: 1.7 10*3/uL (ref 0.7–4.0)
MCH: 30.5 pg (ref 26.0–34.0)
MCHC: 33.7 g/dL (ref 30.0–36.0)
MCV: 90.6 fL (ref 78.0–100.0)
MONO ABS: 1.1 10*3/uL — AB (ref 0.1–1.0)
Monocytes Relative: 10 % (ref 3–12)
Neutro Abs: 8.3 10*3/uL — ABNORMAL HIGH (ref 1.7–7.7)
Neutrophils Relative %: 74 % (ref 43–77)
PLATELETS: 288 10*3/uL (ref 150–400)
RBC: 5.11 MIL/uL (ref 4.22–5.81)
RDW: 13 % (ref 11.5–15.5)
WBC: 11.2 10*3/uL — AB (ref 4.0–10.5)

## 2014-10-07 LAB — I-STAT CHEM 8, ED
BUN: 17 mg/dL (ref 6–23)
CALCIUM ION: 1.2 mmol/L (ref 1.12–1.23)
CHLORIDE: 102 meq/L (ref 96–112)
Creatinine, Ser: 1.1 mg/dL (ref 0.50–1.35)
GLUCOSE: 105 mg/dL — AB (ref 70–99)
HEMATOCRIT: 51 % (ref 39.0–52.0)
Hemoglobin: 17.3 g/dL — ABNORMAL HIGH (ref 13.0–17.0)
Potassium: 4.3 mEq/L (ref 3.7–5.3)
Sodium: 139 mEq/L (ref 137–147)
TCO2: 28 mmol/L (ref 0–100)

## 2014-10-07 SURGERY — NERVE, TENDON AND ARTERY REPAIR
Anesthesia: General | Site: Thumb | Laterality: Left

## 2014-10-07 MED ORDER — PROPOFOL 10 MG/ML IV BOLUS
INTRAVENOUS | Status: AC
  Filled 2014-10-07: qty 20

## 2014-10-07 MED ORDER — SUCCINYLCHOLINE CHLORIDE 20 MG/ML IJ SOLN
INTRAMUSCULAR | Status: AC
  Filled 2014-10-07: qty 1

## 2014-10-07 MED ORDER — TETANUS-DIPHTH-ACELL PERTUSSIS 5-2.5-18.5 LF-MCG/0.5 IM SUSP
0.5000 mL | Freq: Once | INTRAMUSCULAR | Status: AC
  Administered 2014-10-07: 0.5 mL via INTRAMUSCULAR
  Filled 2014-10-07: qty 0.5

## 2014-10-07 MED ORDER — CHLORHEXIDINE GLUCONATE 4 % EX LIQD
60.0000 mL | Freq: Once | CUTANEOUS | Status: DC
  Filled 2014-10-07: qty 60

## 2014-10-07 MED ORDER — SUCCINYLCHOLINE CHLORIDE 20 MG/ML IJ SOLN
INTRAMUSCULAR | Status: DC | PRN
  Administered 2014-10-07: 120 mg via INTRAVENOUS

## 2014-10-07 MED ORDER — ONDANSETRON HCL 4 MG/2ML IJ SOLN
INTRAMUSCULAR | Status: AC
  Filled 2014-10-07: qty 2

## 2014-10-07 MED ORDER — LIDOCAINE HCL (CARDIAC) 20 MG/ML IV SOLN
INTRAVENOUS | Status: AC
  Filled 2014-10-07: qty 10

## 2014-10-07 MED ORDER — ARTIFICIAL TEARS OP OINT
TOPICAL_OINTMENT | OPHTHALMIC | Status: AC
  Filled 2014-10-07: qty 3.5

## 2014-10-07 MED ORDER — OXYCODONE-ACETAMINOPHEN 5-325 MG PO TABS: 1.0000 | ORAL_TABLET | ORAL | Status: DC | PRN

## 2014-10-07 MED ORDER — LACTATED RINGERS IV SOLN
INTRAVENOUS | Status: DC | PRN
  Administered 2014-10-07 (×2): via INTRAVENOUS

## 2014-10-07 MED ORDER — ALBUMIN HUMAN 5 % IV SOLN
INTRAVENOUS | Status: DC | PRN
  Administered 2014-10-07: 19:00:00 via INTRAVENOUS

## 2014-10-07 MED ORDER — SODIUM CHLORIDE 0.9 % IJ SOLN
INTRAMUSCULAR | Status: AC
  Filled 2014-10-07: qty 10

## 2014-10-07 MED ORDER — ROCURONIUM BROMIDE 50 MG/5ML IV SOLN
INTRAVENOUS | Status: AC
  Filled 2014-10-07: qty 1

## 2014-10-07 MED ORDER — LIDOCAINE HCL (CARDIAC) 20 MG/ML IV SOLN
INTRAVENOUS | Status: AC
  Filled 2014-10-07: qty 5

## 2014-10-07 MED ORDER — ESMOLOL HCL 10 MG/ML IV SOLN
INTRAVENOUS | Status: DC | PRN
  Administered 2014-10-07: 50 mg via INTRAVENOUS

## 2014-10-07 MED ORDER — PHENYLEPHRINE 40 MCG/ML (10ML) SYRINGE FOR IV PUSH (FOR BLOOD PRESSURE SUPPORT)
PREFILLED_SYRINGE | INTRAVENOUS | Status: AC
  Filled 2014-10-07: qty 10

## 2014-10-07 MED ORDER — ONDANSETRON HCL 4 MG/2ML IJ SOLN: 4.0000 mg | Freq: Four times a day (QID) | INTRAMUSCULAR | Status: DC | PRN

## 2014-10-07 MED ORDER — PHENYLEPHRINE HCL 10 MG/ML IJ SOLN
INTRAMUSCULAR | Status: DC | PRN
  Administered 2014-10-07 (×4): 80 ug via INTRAVENOUS

## 2014-10-07 MED ORDER — ONDANSETRON HCL 4 MG/2ML IJ SOLN
INTRAMUSCULAR | Status: DC | PRN
  Administered 2014-10-07: 4 mg via INTRAVENOUS

## 2014-10-07 MED ORDER — DEXAMETHASONE SODIUM PHOSPHATE 4 MG/ML IJ SOLN
INTRAMUSCULAR | Status: AC
  Filled 2014-10-07: qty 1

## 2014-10-07 MED ORDER — OXYCODONE HCL 5 MG PO TABS
5.0000 mg | ORAL_TABLET | Freq: Once | ORAL | Status: AC | PRN
  Administered 2014-10-07: 5 mg via ORAL

## 2014-10-07 MED ORDER — CEFAZOLIN SODIUM-DEXTROSE 2-3 GM-% IV SOLR
2.0000 g | INTRAVENOUS | Status: AC
  Administered 2014-10-07: 2 g via INTRAVENOUS
  Filled 2014-10-07: qty 50

## 2014-10-07 MED ORDER — BUPIVACAINE HCL (PF) 0.25 % IJ SOLN
INTRAMUSCULAR | Status: DC | PRN
  Administered 2014-10-07: 9 mL

## 2014-10-07 MED ORDER — DEXAMETHASONE SODIUM PHOSPHATE 4 MG/ML IJ SOLN
INTRAMUSCULAR | Status: DC | PRN
  Administered 2014-10-07: 4 mg via INTRAVENOUS

## 2014-10-07 MED ORDER — PROPOFOL 10 MG/ML IV BOLUS
INTRAVENOUS | Status: DC | PRN
  Administered 2014-10-07: 150 mg via INTRAVENOUS
  Administered 2014-10-07: 50 mg via INTRAVENOUS

## 2014-10-07 MED ORDER — MIDAZOLAM HCL 2 MG/2ML IJ SOLN
INTRAMUSCULAR | Status: AC
  Filled 2014-10-07: qty 2

## 2014-10-07 MED ORDER — FENTANYL CITRATE 0.05 MG/ML IJ SOLN
INTRAMUSCULAR | Status: AC
  Filled 2014-10-07: qty 5

## 2014-10-07 MED ORDER — HYDROMORPHONE HCL 1 MG/ML IJ SOLN
INTRAMUSCULAR | Status: AC
  Administered 2014-10-07: 0.5 mg via INTRAVENOUS
  Filled 2014-10-07: qty 1

## 2014-10-07 MED ORDER — HYDROMORPHONE HCL 1 MG/ML IJ SOLN
0.2500 mg | INTRAMUSCULAR | Status: DC | PRN
  Administered 2014-10-07 (×4): 0.5 mg via INTRAVENOUS

## 2014-10-07 MED ORDER — BUPIVACAINE HCL (PF) 0.25 % IJ SOLN
INTRAMUSCULAR | Status: AC
  Filled 2014-10-07: qty 30

## 2014-10-07 MED ORDER — OXYCODONE HCL 5 MG PO TABS
ORAL_TABLET | ORAL | Status: AC
  Filled 2014-10-07: qty 1

## 2014-10-07 MED ORDER — LIDOCAINE HCL (CARDIAC) 20 MG/ML IV SOLN
INTRAVENOUS | Status: DC | PRN
  Administered 2014-10-07: 100 mg via INTRAVENOUS

## 2014-10-07 MED ORDER — MORPHINE SULFATE 4 MG/ML IJ SOLN
4.0000 mg | Freq: Once | INTRAMUSCULAR | Status: AC
  Administered 2014-10-07: 4 mg via INTRAVENOUS
  Filled 2014-10-07: qty 1

## 2014-10-07 MED ORDER — CEFAZOLIN SODIUM 1-5 GM-% IV SOLN
1.0000 g | Freq: Once | INTRAVENOUS | Status: AC
  Administered 2014-10-07: 1 g via INTRAVENOUS
  Filled 2014-10-07: qty 50

## 2014-10-07 MED ORDER — OXYCODONE HCL 5 MG/5ML PO SOLN: 5.0000 mg | Freq: Once | ORAL | Status: AC | PRN

## 2014-10-07 MED ORDER — SODIUM CHLORIDE 0.9 % IR SOLN
Status: DC | PRN
  Administered 2014-10-07: 1000 mL

## 2014-10-07 MED ORDER — FENTANYL CITRATE 0.05 MG/ML IJ SOLN
INTRAMUSCULAR | Status: DC | PRN
  Administered 2014-10-07: 100 ug via INTRAVENOUS
  Administered 2014-10-07: 50 ug via INTRAVENOUS

## 2014-10-07 MED ORDER — MIDAZOLAM HCL 5 MG/5ML IJ SOLN
INTRAMUSCULAR | Status: DC | PRN
  Administered 2014-10-07: 2 mg via INTRAVENOUS

## 2014-10-07 MED ORDER — KEFLEX 500 MG PO CAPS: 500.0000 mg | ORAL_CAPSULE | Freq: Four times a day (QID) | ORAL | Status: AC

## 2014-10-07 MED ORDER — PHENYLEPHRINE HCL 10 MG/ML IJ SOLN
10.0000 mg | INTRAMUSCULAR | Status: DC | PRN
Start: 2014-10-07 — End: 2014-10-07
  Administered 2014-10-07: 20 ug/min via INTRAVENOUS

## 2014-10-07 MED ORDER — EPHEDRINE SULFATE 50 MG/ML IJ SOLN
INTRAMUSCULAR | Status: AC
  Filled 2014-10-07: qty 1

## 2014-10-07 MED ORDER — LACTATED RINGERS IV SOLN
INTRAVENOUS | Status: DC
  Administered 2014-10-07: 17:00:00 via INTRAVENOUS

## 2014-10-07 MED ORDER — ARTIFICIAL TEARS OP OINT
TOPICAL_OINTMENT | OPHTHALMIC | Status: DC | PRN
  Administered 2014-10-07: 1 via OPHTHALMIC

## 2014-10-07 SURGICAL SUPPLY — 61 items
BANDAGE ELASTIC 4 VELCRO ST LF (GAUZE/BANDAGES/DRESSINGS) ×2 IMPLANT
BNDG CMPR 9X4 STRL LF SNTH (GAUZE/BANDAGES/DRESSINGS) ×1
BNDG ESMARK 4X9 LF (GAUZE/BANDAGES/DRESSINGS) ×2 IMPLANT
BNDG GAUZE ELAST 4 BULKY (GAUZE/BANDAGES/DRESSINGS) ×2 IMPLANT
CORDS BIPOLAR (ELECTRODE) ×2 IMPLANT
COVER SURGICAL LIGHT HANDLE (MISCELLANEOUS) ×2 IMPLANT
CUFF TOURNIQUET SINGLE 18IN (TOURNIQUET CUFF) ×2 IMPLANT
CUFF TOURNIQUET SINGLE 24IN (TOURNIQUET CUFF) IMPLANT
DECANTER SPIKE VIAL GLASS SM (MISCELLANEOUS) IMPLANT
DRAPE OEC MINIVIEW 54X84 (DRAPES) ×2 IMPLANT
DRAPE SURG 17X23 STRL (DRAPES) ×2 IMPLANT
DURAPREP 26ML APPLICATOR (WOUND CARE) ×2 IMPLANT
GAUZE SPONGE 4X4 12PLY STRL (GAUZE/BANDAGES/DRESSINGS) IMPLANT
GAUZE XEROFORM 1X8 LF (GAUZE/BANDAGES/DRESSINGS) ×2 IMPLANT
GLOVE BIO SURGEON STRL SZ8.5 (GLOVE) ×2 IMPLANT
GLOVE BIOGEL PI IND STRL 8 (GLOVE) ×2 IMPLANT
GLOVE BIOGEL PI INDICATOR 8 (GLOVE) ×2
GLOVE BIOGEL PI ORTHO PRO SZ7 (GLOVE) ×1
GLOVE PI ORTHO PRO STRL SZ7 (GLOVE) ×1 IMPLANT
GLOVE SURG SS PI 6.0 STRL IVOR (GLOVE) ×2 IMPLANT
GOWN STRL REUS W/ TWL LRG LVL3 (GOWN DISPOSABLE) ×1 IMPLANT
GOWN STRL REUS W/ TWL XL LVL3 (GOWN DISPOSABLE) ×1 IMPLANT
GOWN STRL REUS W/TWL LRG LVL3 (GOWN DISPOSABLE) ×2
GOWN STRL REUS W/TWL XL LVL3 (GOWN DISPOSABLE) ×2
K-WIRE DBL TROCAR .045X4 ×4 IMPLANT
KIT BASIN OR (CUSTOM PROCEDURE TRAY) ×2 IMPLANT
KIT ROOM TURNOVER OR (KITS) ×2 IMPLANT
KWIRE DBL TROCAR .045X4 ×2 IMPLANT
LOOP VESSEL MAXI BLUE (MISCELLANEOUS) IMPLANT
MANIFOLD NEPTUNE II (INSTRUMENTS) ×2 IMPLANT
NEEDLE HYPO 25GX1X1/2 BEV (NEEDLE) ×2 IMPLANT
NS IRRIG 1000ML POUR BTL (IV SOLUTION) ×2 IMPLANT
PACK ORTHO EXTREMITY (CUSTOM PROCEDURE TRAY) ×2 IMPLANT
PAD ARMBOARD 7.5X6 YLW CONV (MISCELLANEOUS) ×4 IMPLANT
PAD CAST 4YDX4 CTTN HI CHSV (CAST SUPPLIES) ×1 IMPLANT
PADDING CAST ABS 4INX4YD NS (CAST SUPPLIES) ×1
PADDING CAST ABS COTTON 4X4 ST (CAST SUPPLIES) ×1 IMPLANT
PADDING CAST COTTON 4X4 STRL (CAST SUPPLIES) ×2
SPEAR EYE SURG WECK-CEL (MISCELLANEOUS) ×2 IMPLANT
SPECIMEN JAR SMALL (MISCELLANEOUS) ×2 IMPLANT
SPLINT FIBERGLASS 4X15 (CAST SUPPLIES) ×2 IMPLANT
SPONGE GAUZE 4X4 12PLY STER LF (GAUZE/BANDAGES/DRESSINGS) ×2 IMPLANT
STRIP CLOSURE SKIN 1/2X4 (GAUZE/BANDAGES/DRESSINGS) IMPLANT
SUCTION FRAZIER TIP 10 FR DISP (SUCTIONS) ×2 IMPLANT
SUT ETHIBOND 3-0 V-5 (SUTURE) IMPLANT
SUT ETHILON 4 0 P 3 18 (SUTURE) ×2 IMPLANT
SUT ETHILON 5 0 PS 2 18 (SUTURE) IMPLANT
SUT ETHILON 9 0 BV130 4 (SUTURE) ×2 IMPLANT
SUT FIBERWIRE 3-0 18 TAPR NDL (SUTURE) ×4
SUT MERSILENE 4 0 P 3 (SUTURE) IMPLANT
SUT PROLENE 3 0 PS 2 (SUTURE) IMPLANT
SUT SILK 4 0 PS 2 (SUTURE) IMPLANT
SUT VIC AB 3-0 FS2 27 (SUTURE) IMPLANT
SUT VICRYL RAPIDE 4/0 PS 2 (SUTURE) ×4 IMPLANT
SUTURE FIBERWR 3-0 18 TAPR NDL (SUTURE) ×2 IMPLANT
SYR CONTROL 10ML LL (SYRINGE) ×2 IMPLANT
TOWEL OR 17X24 6PK STRL BLUE (TOWEL DISPOSABLE) ×2 IMPLANT
TOWEL OR 17X26 10 PK STRL BLUE (TOWEL DISPOSABLE) ×2 IMPLANT
TUBE CONNECTING 12X1/4 (SUCTIONS) ×2 IMPLANT
UNDERPAD 30X30 INCONTINENT (UNDERPADS AND DIAPERS) ×2 IMPLANT
WATER STERILE IRR 1000ML POUR (IV SOLUTION) ×2 IMPLANT

## 2014-10-07 NOTE — ED Notes (Signed)
Attempted to call [facility] [phone], no answer.

## 2014-10-07 NOTE — Transfer of Care (Signed)
Immediate Anesthesia Transfer of Care Note  Patient: Tyler [name]  Procedure(s) Performed: Procedure(s) with comments: ORIF PROXIMAL THUMB REPAIR OF EPL AND FLP TENDON MICROSCOPIC NERVE, TENDON AND RADIAL ARTERY REPAIR WITH WOUND EXPLORATION (Left) - OEC, MICROSCOPE  Patient Location: PACU  Anesthesia Type:General  Level of Consciousness: awake, alert , oriented and patient cooperative  Airway & Oxygen Therapy: Patient Spontanous Breathing and Patient connected to nasal cannula oxygen  Post-op Assessment: Report given to PACU RN, Post -op Vital signs reviewed and stable and Patient moving all extremities X 4  Post vital signs: Reviewed and stable  Complications: No apparent anesthesia complications

## 2014-10-07 NOTE — ED Provider Notes (Addendum)
CSN: [id number]     Arrival date & time 10/07/14  1439 History   First MD Initiated Contact with Patient 10/07/14 1504     Chief Complaint  Patient presents with  . Finger Injury    Patient is a 43 y.o. male presenting with hand injury. The history is provided by the patient.  Hand Injury Location:  Finger Time since incident:  1 hour Injury: yes   Mechanism of injury: amputation   Finger location:  L thumb Pain details:    Quality:  Sharp   Radiates to:  Does not radiate   Severity:  Severe   Onset quality:  Sudden   Duration:  1 hour   Timing:  Constant Chronicity:  New Foreign body present:  No foreign bodies Tetanus status:  Out of date Prior injury to area:  No Relieved by:  Nothing Exacerbated by: palpation. Ineffective treatments:  None tried patient was working with a skill saw. The saw kicked off and partially amputated his left thumb. The patient still has sensation at the tip of his thumb but is unable to move it. Last time he had something to eat was at 12 noon.  Past Medical History  Diagnosis Date  . Back pain   . Alcohol abuse   . Hypertension   . Kidney disease    Past Surgical History  Procedure Laterality Date  . Mandible surgery     History reviewed. No pertinent family history. History  Substance Use Topics  . Smoking status: Current Every Day Smoker -- 1.00 packs/day  . Smokeless tobacco: Current User  . Alcohol Use: 1.2 oz/week    2 Cans of beer per week    Review of Systems  All other systems reviewed and are negative.     Allergies  Review of patient's allergies indicates no known allergies.  Home Medications   Prior to Admission medications   Not on File   BP 182/110 mmHg  Pulse 113  Temp(Src) 98.2 F (36.8 C) (Oral)  Resp 16  SpO2 100% Physical Exam  Constitutional: He appears well-developed and well-nourished. No distress.  HENT:  Head: Normocephalic and atraumatic.  Right Ear: External ear normal.  Left Ear:  External ear normal.  Eyes: Conjunctivae are normal. Right eye exhibits no discharge. Left eye exhibits no discharge. No scleral icterus.  Neck: Neck supple. No tracheal deviation present.  Cardiovascular: Normal rate.   Pulmonary/Chest: Effort normal. No stridor. No respiratory distress.  Musculoskeletal: He exhibits no edema.  Partial amuptation of the left thumb with visible bone and tendon, deformity noted, wound initiats at the metacarpal, cap refill diminished, still has sensation to light touch at the tip, unable to move his finger when asked  (see image)  Neurological: He is alert. Cranial nerve deficit: no gross deficits.  Skin: Skin is warm and dry. No rash noted.  Psychiatric: He has a normal mood and affect.  Nursing note and vitals reviewed.     ED Course  Procedures (including critical care time) Labs Review Labs Reviewed  CBC WITH DIFFERENTIAL  I-STAT CHEM 8, ED    Imaging Review Dg Hand Complete Left  10/07/2014   CLINICAL DATA:  Cut thumb with a skill saw today.  Open fracture.  EXAM: LEFT HAND - COMPLETE 3+ VIEW  COMPARISON:  Radiographs 09/10/2006  FINDINGS: There is a comminuted and markedly displaced fracture involving the proximal phalanx of the thumb with a significant soft tissue injury and complete or near complete amputation. The joint  spaces are maintained. No other significant bony findings.  IMPRESSION: Comminuted and displaced open fracture of the proximal phalanx of the thumb.   Electronically Signed   By: Loralie ChampagneMark  Gallerani M.D.   On: 10/07/2014 15:36      MDM   Final diagnoses:  Partial traumatic metacarpophalangeal amputation of thumb, left, initial encounter    Pt has grossly deformed partial amputation.  Saline gauze applied to the wound.  Will update tetnaus and give 1 gm ancef.  I will consult hand surgeon on call.    Linwood DibblesJon Grayton Lobo, MD 10/07/14 1526  Discussed with Dr Mina MarbleWeingold.  [facility]  [facility]  We will arrange for transfer.  Linwood DibblesJon Xavious Sharrar, MD 10/07/14 [phone]

## 2014-10-07 NOTE — Anesthesia Procedure Notes (Signed)
Procedure Name: Intubation Date/Time: 10/07/2014 6:26 PM Performed by: Melina SchoolsBANKS, Tyler [name] Pre-anesthesia Checklist: Patient identified, Emergency Drugs available, Suction available, Patient being monitored and Timeout performed Patient Re-evaluated:Patient Re-evaluated prior to inductionOxygen Delivery Method: Circle system utilized and Simple face mask Preoxygenation: Pre-oxygenation with 100% oxygen Intubation Type: IV induction and Rapid sequence Laryngoscope Size: Miller and 3 Grade View: Grade I Tube type: Oral Tube size: 7.5 mm Number of attempts: 1 Airway Equipment and Method: Patient positioned with wedge pillow and Stylet Placement Confirmation: ETT inserted through vocal cords under direct vision,  positive ETCO2 and breath sounds checked- equal and bilateral Secured at: 22 cm Tube secured with: Tape Dental Injury: Teeth and Oropharynx as per pre-operative assessment

## 2014-10-07 NOTE — Progress Notes (Signed)
Dr. Michelle Piperssey notified of patient's BP no further orders

## 2014-10-07 NOTE — ED Notes (Signed)
Pt presents with an actively bleeding left thumb, states the injury happened when he was using a skill saw. Wound inspection reveals an almost full transverse cut with thumb phalangeal visible. Pt c/o pain 10/10, in mild pain induced distress.

## 2014-10-07 NOTE — H&P (Signed)
Tyler [name] is an 43 y.o. male.   Chief Complaint: left thumb pain and deformity HPI: as above with skillsaw injury to left thumb  Past Medical History  Diagnosis Date  . Back pain   . Alcohol abuse   . Hypertension   . Kidney disease     Past Surgical History  Procedure Laterality Date  . Mandible surgery      History reviewed. No pertinent family history. Social History:  reports that he has been smoking.  He uses smokeless tobacco. He reports that he drinks about 1.2 oz of alcohol per week. He reports that he uses illicit drugs.  Allergies: No Known Allergies  Medications Prior to Admission  Medication Sig Dispense Refill  . ibuprofen (ADVIL,MOTRIN) 200 MG tablet Take 800 mg by mouth every 6 (six) hours as needed for moderate pain.    . Multiple Vitamin (MULTIVITAMIN WITH MINERALS) TABS tablet Take 1 tablet by mouth daily.      Results for orders placed or performed during the hospital encounter of 10/07/14 (from the past 48 hour(s))  CBC with Differential     Status: Abnormal   Collection Time: 10/07/14  4:13 PM  Result Value Ref Range   WBC 11.2 (H) 4.0 - 10.5 K/uL   RBC 5.11 4.22 - 5.81 MIL/uL   Hemoglobin 15.6 13.0 - 17.0 g/dL   HCT 16.146.3 09.639.0 - 04.552.0 %   MCV 90.6 78.0 - 100.0 fL   MCH 30.5 26.0 - 34.0 pg   MCHC 33.7 30.0 - 36.0 g/dL   RDW 40.913.0 81.111.5 - 91.415.5 %   Platelets 288 150 - 400 K/uL   Neutrophils Relative % 74 43 - 77 %   Neutro Abs 8.3 (H) 1.7 - 7.7 K/uL   Lymphocytes Relative 15 12 - 46 %   Lymphs Abs 1.7 0.7 - 4.0 K/uL   Monocytes Relative 10 3 - 12 %   Monocytes Absolute 1.1 (H) 0.1 - 1.0 K/uL   Eosinophils Relative 1 0 - 5 %   Eosinophils Absolute 0.1 0.0 - 0.7 K/uL   Basophils Relative 0 0 - 1 %   Basophils Absolute 0.0 0.0 - 0.1 K/uL  I-stat Chem 8, ED     Status: Abnormal   Collection Time: 10/07/14  4:20 PM  Result Value Ref Range   Sodium 139 137 - 147 mEq/L   Potassium 4.3 3.7 - 5.3 mEq/L   Chloride 102 96 - 112 mEq/L   BUN 17 6 - 23  mg/dL   Creatinine, Ser 7.821.10 0.50 - 1.35 mg/dL   Glucose, Bld 956105 (H) 70 - 99 mg/dL   Calcium, Ion 2.131.20 1.12 - 1.23 mmol/L   TCO2 28 0 - 100 mmol/L   Hemoglobin 17.3 (H) 13.0 - 17.0 g/dL   HCT 08.651.0 57.839.0 - 46.952.0 %   Dg Hand Complete Left  10/07/2014   CLINICAL DATA:  Cut thumb with [name] skill saw today.  Open fracture.  EXAM: LEFT HAND - COMPLETE 3+ VIEW  COMPARISON:  Radiographs 09/10/2006  FINDINGS: There is [name] comminuted and markedly displaced fracture involving the proximal phalanx of the thumb with [name] significant soft tissue injury and complete or near complete amputation. The joint spaces are maintained. No other significant bony findings.  IMPRESSION: Comminuted and displaced open fracture of the proximal phalanx of the thumb.   Electronically Signed   By: Loralie ChampagneMark  Gallerani M.D.   On: 10/07/2014 15:36    Review of Systems  All other systems reviewed and  are negative.   Blood pressure 201/120, pulse 112, temperature 98.2 F (36.8 C), temperature source Oral, resp. rate 18, height 5\' 8"  (1.727 m), weight 74.844 kg (165 lb), SpO2 100 %. Physical Exam  Constitutional: He is oriented to person, place, and time. He appears well-developed and well-nourished.  HENT:  Head: Normocephalic and atraumatic.  Cardiovascular: Normal rate.   Respiratory: Effort normal.  Musculoskeletal:       Left hand: He exhibits tenderness, bony tenderness, deformity and laceration.  Complex left thumb open injury with phalanx fracture and T/[name]/N damage  Neurological: He is alert and oriented to person, place, and time.  Skin: Skin is warm.  Psychiatric: He has [name] normal mood and affect. His behavior is normal. Judgment and thought content normal.     Assessment/Plan As above   Plan explore and repair as needed  Tyler [name] 10/07/2014, 5:20 PM

## 2014-10-07 NOTE — Progress Notes (Signed)
Report given to Jo, RN.

## 2014-10-07 NOTE — Anesthesia Preprocedure Evaluation (Addendum)
Anesthesia Evaluation  Patient identified by MRN, date of birth, ID band Patient awake    Reviewed: Allergy & Precautions, H&P , NPO status , Patient's Chart, lab work & pertinent test results  Airway Mallampati: II   Neck ROM: full    Dental  (+) Dental Advisory Given, Poor Dentition   Pulmonary Current Smoker,          Cardiovascular hypertension,     Neuro/Psych    GI/Hepatic (+)     substance abuse  alcohol use,   Endo/Other    Renal/GU      Musculoskeletal   Abdominal   Peds  Hematology   Anesthesia Other Findings   Reproductive/Obstetrics                            Anesthesia Physical Anesthesia Plan  ASA: II and emergent  Anesthesia Plan: General   Post-op Pain Management:    Induction: Intravenous  Airway Management Planned: Oral ETT  Additional Equipment:   Intra-op Plan:   Post-operative Plan: Extubation in OR  Informed Consent: I have reviewed the patients History and Physical, chart, labs and discussed the procedure including the risks, benefits and alternatives for the proposed anesthesia with the patient or authorized representative who has indicated his/her understanding and acceptance.   Dental advisory given  Plan Discussed with: CRNA, Anesthesiologist and Surgeon  Anesthesia Plan Comments:        Anesthesia Quick Evaluation

## 2014-10-07 NOTE — Anesthesia Postprocedure Evaluation (Signed)
Anesthesia Post Note  Patient: Tyler [name]  Procedure(s) Performed: Procedure(s) (LRB): ORIF PROXIMAL THUMB REPAIR OF EPL AND FLP TENDON MICROSCOPIC NERVE, TENDON AND RADIAL ARTERY REPAIR WITH WOUND EXPLORATION (Left)  Anesthesia type: general  Patient location: PACU  Post pain: Pain level controlled  Post assessment: Patient's Cardiovascular Status Stable  Last Vitals:  Filed Vitals:   10/07/14 2115  BP: 134/88  Pulse: 96  Temp: 36.9 C  Resp:     Post vital signs: Reviewed and stable  Level of consciousness: sedated  Complications: No apparent anesthesia complications

## 2014-10-07 NOTE — Op Note (Signed)
See note [id number]

## 2014-10-07 NOTE — Progress Notes (Addendum)
Patient refuses to have valuables locked up. Reports that he has 4- 5 dollars money but staff did not count. Informed patient that we could not be responsible verbalized understanding

## 2014-10-08 NOTE — Op Note (Signed)
[name]:  [name], Tyler             ACCOUNT NO.:  [id number]  MEDICAL RECORD NO.:  [id number]  LOCATION:  [facility]                         FACILITY:  [facility]  PHYSICIAN:  Artist PaisMatthew A. Savayah Waltrip, M.D.DATE OF BIRTH:  January 28, 1971  DATE OF PROCEDURE:  10/07/2014 DATE OF DISCHARGE:  10/07/2014                              OPERATIVE REPORT   PREOPERATIVE DIAGNOSES:  Table saw injury, left thumb with open proximal phalangeal fracture.  Extensor and flexor tendon lacerations and laceration of radial neurovascular bundle.  POSTOPERATIVE DIAGNOSES:  Table saw injury, left thumb with open proximal phalangeal fracture.  Extensor and flexor tendon lacerations and laceration of radial neurovascular bundle.  PROCEDURE:  Exploration of above with ORIF, proximal phalangeal fracture.  Repair of extensor pollicis longus tendon, flexor pollicis longus tendon, and microscopic repair of radial digital artery and nerve.  SURGEON:  Artist PaisMatthew A. Mina MarbleWeingold, M.D.  ASSISTANT:  None.  ANESTHESIA:  General.  No complication.  No drains.  The patient was taken to the operating suite.  After induction of adequate general anesthetic, left upper extremity was prepped and draped in sterile fashion.  Esmarch was used to exsanguinate the limb.  Prior to being the left upper extremity prepped and draped in sterile fashion, we irrigated 1 L of normal saline to this complex open wound exposing open proximal phalangeal fracture and intact ulnar neurovascular bundle. Complete EPL and FPL lacerations and complete laceration of the radial neurovascular bundle.  After a thorough irrigation, we then raised the tourniquet.  We first fixed the proximal phalangeal fracture with crossed 0.5 K-wires that were driven from the distal aspect of the proximal phalanx out to the skin, driven flushed to the base of the proximal phalanx.  We then reduced the fracture and drove them out into the distal fragment in cross fashion, fixed in the  bone.  These were cut outside the skin and bent upon themselves.  Proximally, we then repaired the EPL tendon with 3-0 FiberWire and horizontal mattress sutures x3. We then made a mid lateral incision along the radial border of the thumb from the middle of the proximal phalanx to the IP joint.  Dissection was carried down.  We identified the distal neurovascular bundle was completely lacerated and the FPL tendon.  We also treated the proximal PL tendon.  This was repaired with 3-0 FiberWire and 2 horizontal mattress sutures.  Once this was completed, the microscope was brought onto the field.  After the microscope was brought on the field, under microscopic magnification, repaired the radial digital artery and nerve with 9-0 nylon.  The wound was irrigated and then loosely closed the wounds with 4-0 Vicryl Rapide.  The 4x4s, fluffs, and a radial gutter splint was applied.  The patient tolerated the procedures well in a concealed fashion.     Artist PaisMatthew A. Mina MarbleWeingold, M.D.     MAW/MEDQ  D:  10/07/2014  T:  10/08/2014  Job:  [id number]

## 2014-10-09 ENCOUNTER — Encounter (HOSPITAL_COMMUNITY): Payer: Self-pay | Admitting: Orthopedic Surgery

## 2019-07-23 ENCOUNTER — Inpatient Hospital Stay (HOSPITAL_COMMUNITY)
Admission: AD | Admit: 2019-07-23 | Discharge: 2019-12-16 | DRG: 064 | Disposition: A | Discharge status: Home / Self Care | Payer: Medicaid Other | Source: Other Acute Inpatient Hospital | Attending: Internal Medicine | Admitting: Internal Medicine

## 2019-07-23 ENCOUNTER — Inpatient Hospital Stay (HOSPITAL_COMMUNITY): Payer: Medicaid Other

## 2019-07-23 DIAGNOSIS — N179 Acute kidney failure, unspecified: Secondary | ICD-10-CM | POA: Diagnosis present

## 2019-07-23 DIAGNOSIS — G9341 Metabolic encephalopathy: Secondary | ICD-10-CM

## 2019-07-23 DIAGNOSIS — F17210 Nicotine dependence, cigarettes, uncomplicated: Secondary | ICD-10-CM | POA: Diagnosis present

## 2019-07-23 DIAGNOSIS — R40243 Glasgow coma scale score 3-8, unspecified time: Secondary | ICD-10-CM | POA: Diagnosis present

## 2019-07-23 DIAGNOSIS — R740 Nonspecific elevation of levels of transaminase and lactic acid dehydrogenase [LDH]: Secondary | ICD-10-CM

## 2019-07-23 DIAGNOSIS — N390 Urinary tract infection, site not specified: Secondary | ICD-10-CM | POA: Diagnosis present

## 2019-07-23 DIAGNOSIS — R7401 Elevation of levels of liver transaminase levels: Secondary | ICD-10-CM | POA: Diagnosis present

## 2019-07-23 DIAGNOSIS — R29810 Facial weakness: Secondary | ICD-10-CM | POA: Diagnosis present

## 2019-07-23 DIAGNOSIS — I6340 Cerebral infarction due to embolism of unspecified cerebral artery: Secondary | ICD-10-CM | POA: Diagnosis not present

## 2019-07-23 DIAGNOSIS — R748 Abnormal levels of other serum enzymes: Secondary | ICD-10-CM

## 2019-07-23 DIAGNOSIS — E875 Hyperkalemia: Secondary | ICD-10-CM | POA: Diagnosis present

## 2019-07-23 DIAGNOSIS — R680 Hypothermia, not associated with low environmental temperature: Secondary | ICD-10-CM | POA: Diagnosis present

## 2019-07-23 DIAGNOSIS — G9349 Other encephalopathy: Secondary | ICD-10-CM | POA: Diagnosis present

## 2019-07-23 DIAGNOSIS — I10 Essential (primary) hypertension: Secondary | ICD-10-CM | POA: Diagnosis not present

## 2019-07-23 DIAGNOSIS — F1110 Opioid abuse, uncomplicated: Secondary | ICD-10-CM | POA: Diagnosis present

## 2019-07-23 DIAGNOSIS — R17 Unspecified jaundice: Secondary | ICD-10-CM

## 2019-07-23 DIAGNOSIS — I161 Hypertensive emergency: Secondary | ICD-10-CM | POA: Diagnosis not present

## 2019-07-23 DIAGNOSIS — K7040 Alcoholic hepatic failure without coma: Secondary | ICD-10-CM | POA: Diagnosis present

## 2019-07-23 DIAGNOSIS — I63433 Cerebral infarction due to embolism of bilateral posterior cerebral arteries: Secondary | ICD-10-CM | POA: Diagnosis present

## 2019-07-23 DIAGNOSIS — G9389 Other specified disorders of brain: Secondary | ICD-10-CM | POA: Diagnosis present

## 2019-07-23 DIAGNOSIS — W19XXXA Unspecified fall, initial encounter: Secondary | ICD-10-CM

## 2019-07-23 DIAGNOSIS — T405X1A Poisoning by cocaine, accidental (unintentional), initial encounter: Secondary | ICD-10-CM | POA: Diagnosis present

## 2019-07-23 DIAGNOSIS — Z23 Encounter for immunization: Secondary | ICD-10-CM

## 2019-07-23 DIAGNOSIS — Z781 Physical restraint status: Secondary | ICD-10-CM | POA: Diagnosis not present

## 2019-07-23 DIAGNOSIS — R4182 Altered mental status, unspecified: Secondary | ICD-10-CM

## 2019-07-23 DIAGNOSIS — I63423 Cerebral infarction due to embolism of bilateral anterior cerebral arteries: Secondary | ICD-10-CM | POA: Diagnosis present

## 2019-07-23 DIAGNOSIS — F1910 Other psychoactive substance abuse, uncomplicated: Secondary | ICD-10-CM

## 2019-07-23 DIAGNOSIS — R739 Hyperglycemia, unspecified: Secondary | ICD-10-CM | POA: Diagnosis present

## 2019-07-23 DIAGNOSIS — Z751 Person awaiting admission to adequate facility elsewhere: Secondary | ICD-10-CM | POA: Diagnosis not present

## 2019-07-23 DIAGNOSIS — Z20828 Contact with and (suspected) exposure to other viral communicable diseases: Secondary | ICD-10-CM | POA: Diagnosis present

## 2019-07-23 DIAGNOSIS — Z20822 Contact with and (suspected) exposure to covid-19: Secondary | ICD-10-CM | POA: Diagnosis present

## 2019-07-23 DIAGNOSIS — M6282 Rhabdomyolysis: Secondary | ICD-10-CM | POA: Diagnosis present

## 2019-07-23 DIAGNOSIS — K7460 Unspecified cirrhosis of liver: Secondary | ICD-10-CM | POA: Diagnosis present

## 2019-07-23 DIAGNOSIS — E43 Unspecified severe protein-calorie malnutrition: Secondary | ICD-10-CM | POA: Diagnosis present

## 2019-07-23 DIAGNOSIS — E785 Hyperlipidemia, unspecified: Secondary | ICD-10-CM | POA: Diagnosis present

## 2019-07-23 DIAGNOSIS — R4189 Other symptoms and signs involving cognitive functions and awareness: Secondary | ICD-10-CM | POA: Diagnosis present

## 2019-07-23 DIAGNOSIS — N170 Acute kidney failure with tubular necrosis: Secondary | ICD-10-CM | POA: Diagnosis not present

## 2019-07-23 DIAGNOSIS — F1010 Alcohol abuse, uncomplicated: Secondary | ICD-10-CM | POA: Diagnosis present

## 2019-07-23 DIAGNOSIS — S0083XA Contusion of other part of head, initial encounter: Secondary | ICD-10-CM

## 2019-07-23 DIAGNOSIS — E876 Hypokalemia: Secondary | ICD-10-CM | POA: Diagnosis not present

## 2019-07-23 DIAGNOSIS — R1310 Dysphagia, unspecified: Secondary | ICD-10-CM | POA: Diagnosis present

## 2019-07-23 DIAGNOSIS — G92 Toxic encephalopathy: Secondary | ICD-10-CM | POA: Diagnosis present

## 2019-07-23 DIAGNOSIS — M549 Dorsalgia, unspecified: Secondary | ICD-10-CM | POA: Diagnosis present

## 2019-07-23 DIAGNOSIS — Z6824 Body mass index (BMI) 24.0-24.9, adult: Secondary | ICD-10-CM

## 2019-07-23 HISTORY — DX: Rhabdomyolysis: M62.82

## 2019-07-23 HISTORY — DX: Elevation of levels of liver transaminase levels: R74.01

## 2019-07-23 HISTORY — DX: Hyperkalemia: E87.5

## 2019-07-23 HISTORY — DX: Acute kidney failure, unspecified: N17.9

## 2019-07-23 HISTORY — DX: Other psychoactive substance abuse, uncomplicated: F19.10

## 2019-07-23 HISTORY — DX: Metabolic encephalopathy: G93.41

## 2019-07-23 LAB — BLOOD GAS, ARTERIAL
Acid-base deficit: 3.6 mmol/L — ABNORMAL HIGH (ref 0.0–2.0)
Bicarbonate: 20.3 mmol/L (ref 20.0–28.0)
Drawn by: 418751
FIO2: 21
O2 Saturation: 96.6 %
Patient temperature: 98.6
pCO2 arterial: 32.8 mmHg (ref 32.0–48.0)
pH, Arterial: 7.409 (ref 7.350–7.450)
pO2, Arterial: 90.6 mmHg (ref 83.0–108.0)

## 2019-07-23 LAB — COMPREHENSIVE METABOLIC PANEL
ALT: 977 U/L — ABNORMAL HIGH (ref 0–44)
AST: 1551 U/L — ABNORMAL HIGH (ref 15–41)
Albumin: 2.2 g/dL — ABNORMAL LOW (ref 3.5–5.0)
Alkaline Phosphatase: 90 U/L (ref 38–126)
Anion gap: 12 (ref 5–15)
BUN: 51 mg/dL — ABNORMAL HIGH (ref 6–20)
CO2: 16 mmol/L — ABNORMAL LOW (ref 22–32)
Calcium: 6.8 mg/dL — ABNORMAL LOW (ref 8.9–10.3)
Chloride: 109 mmol/L (ref 98–111)
Creatinine, Ser: 3.84 mg/dL — ABNORMAL HIGH (ref 0.61–1.24)
GFR calc Af Amer: 20 mL/min — ABNORMAL LOW (ref >60)
GFR calc non Af Amer: 17 mL/min — ABNORMAL LOW (ref >60)
Glucose, Bld: 133 mg/dL — ABNORMAL HIGH (ref 70–99)
Potassium: 4.5 mmol/L (ref 3.5–5.1)
Sodium: 137 mmol/L (ref 135–145)
Total Bilirubin: 1.3 mg/dL — ABNORMAL HIGH (ref 0.3–1.2)
Total Protein: 5 g/dL — ABNORMAL LOW (ref 6.5–8.1)

## 2019-07-23 LAB — POTASSIUM: Potassium: 4 mmol/L (ref 3.5–5.1)

## 2019-07-23 LAB — URINALYSIS, COMPLETE (UACMP) WITH MICROSCOPIC
Bilirubin Urine: NEGATIVE
Glucose, UA: 50 mg/dL — AB
Ketones, ur: NEGATIVE mg/dL
Nitrite: NEGATIVE
Protein, ur: 30 mg/dL — AB
Specific Gravity, Urine: 1.012 (ref 1.005–1.030)
WBC, UA: >50 WBC/hpf — ABNORMAL HIGH (ref 0–5)
pH: 5 (ref 5.0–8.0)

## 2019-07-23 LAB — CBC
HCT: 45.8 % (ref 39.0–52.0)
Hemoglobin: 15.4 g/dL (ref 13.0–17.0)
MCH: 29.1 pg (ref 26.0–34.0)
MCHC: 33.6 g/dL (ref 30.0–36.0)
MCV: 86.4 fL (ref 80.0–100.0)
Platelets: 212 10*3/uL (ref 150–400)
RBC: 5.3 MIL/uL (ref 4.22–5.81)
RDW: 13.9 % (ref 11.5–15.5)
WBC: 21.6 10*3/uL — ABNORMAL HIGH (ref 4.0–10.5)
nRBC: 0 % (ref 0.0–0.2)

## 2019-07-23 LAB — LACTIC ACID, PLASMA
Lactic Acid, Venous: 1.8 mmol/L (ref 0.5–1.9)
Lactic Acid, Venous: 1.9 mmol/L (ref 0.5–1.9)

## 2019-07-23 LAB — CK: Total CK: 36151 U/L — ABNORMAL HIGH (ref 49–397)

## 2019-07-23 LAB — AMMONIA: Ammonia: 55 umol/L — ABNORMAL HIGH (ref 9–35)

## 2019-07-23 LAB — SARS CORONAVIRUS 2 BY RT PCR (HOSPITAL ORDER, PERFORMED IN ~~LOC~~ HOSPITAL LAB): SARS Coronavirus 2: NEGATIVE

## 2019-07-23 LAB — PROCALCITONIN: Procalcitonin: 13.58 ng/mL

## 2019-07-23 MED ORDER — HYDRALAZINE HCL 20 MG/ML IJ SOLN
5.0000 mg | INTRAMUSCULAR | Status: DC | PRN
  Administered 2019-07-24 – 2019-07-26 (×3): 10 mg via INTRAVENOUS
  Filled 2019-07-23 (×4): qty 1

## 2019-07-23 MED ORDER — STERILE WATER FOR INJECTION IV SOLN
INTRAVENOUS | Status: DC
  Administered 2019-07-23 – 2019-07-25 (×4): via INTRAVENOUS
  Filled 2019-07-23 (×5): qty 850

## 2019-07-23 MED ORDER — LORAZEPAM 2 MG/ML IJ SOLN
1.0000 mg | Freq: Four times a day (QID) | INTRAMUSCULAR | Status: AC | PRN
  Administered 2019-07-25 – 2019-07-26 (×3): 1 mg via INTRAVENOUS
  Filled 2019-07-23 (×3): qty 1

## 2019-07-23 MED ORDER — FOLIC ACID 1 MG PO TABS: 1.0000 mg | ORAL_TABLET | Freq: Every day | ORAL | Status: DC

## 2019-07-23 MED ORDER — ACETAMINOPHEN 325 MG PO TABS
650.0000 mg | ORAL_TABLET | Freq: Four times a day (QID) | ORAL | Status: DC | PRN
  Administered 2019-07-28 – 2019-09-30 (×14): 650 mg via ORAL
  Filled 2019-07-23 (×15): qty 2

## 2019-07-23 MED ORDER — ONDANSETRON HCL 4 MG PO TABS
4.0000 mg | ORAL_TABLET | Freq: Four times a day (QID) | ORAL | Status: DC | PRN
  Administered 2019-08-12 – 2019-09-13 (×2): 4 mg via ORAL
  Filled 2019-07-23 (×2): qty 1

## 2019-07-23 MED ORDER — VITAMIN B-1 100 MG PO TABS
100.0000 mg | ORAL_TABLET | Freq: Every day | ORAL | Status: DC
  Administered 2019-07-29 – 2019-12-16 (×117): 100 mg via ORAL
  Filled 2019-07-23 (×128): qty 1

## 2019-07-23 MED ORDER — ADULT MULTIVITAMIN W/MINERALS CH: 1.0000 | ORAL_TABLET | Freq: Every day | ORAL | Status: DC

## 2019-07-23 MED ORDER — ONDANSETRON HCL 4 MG/2ML IJ SOLN: 4.0000 mg | Freq: Four times a day (QID) | INTRAMUSCULAR | Status: DC | PRN

## 2019-07-23 MED ORDER — HEPARIN SODIUM (PORCINE) 5000 UNIT/ML IJ SOLN
5000.0000 [IU] | Freq: Three times a day (TID) | INTRAMUSCULAR | Status: DC
  Administered 2019-07-23 – 2019-08-12 (×57): 5000 [IU] via SUBCUTANEOUS
  Filled 2019-07-23 (×57): qty 1

## 2019-07-23 MED ORDER — LORAZEPAM 1 MG PO TABS: 1.0000 mg | ORAL_TABLET | Freq: Four times a day (QID) | ORAL | Status: AC | PRN

## 2019-07-23 MED ORDER — ACETAMINOPHEN 650 MG RE SUPP
650.0000 mg | Freq: Four times a day (QID) | RECTAL | Status: DC | PRN
  Filled 2019-07-23: qty 1

## 2019-07-23 MED ORDER — THIAMINE HCL 100 MG/ML IJ SOLN
100.0000 mg | Freq: Every day | INTRAMUSCULAR | Status: DC
  Administered 2019-07-23 – 2019-11-01 (×10): 100 mg via INTRAVENOUS
  Filled 2019-07-23 (×17): qty 2

## 2019-07-23 MED ORDER — CALCIUM GLUCONATE-NACL 1-0.675 GM/50ML-% IV SOLN
1.0000 g | Freq: Once | INTRAVENOUS | Status: AC
  Administered 2019-07-23: 1000 mg via INTRAVENOUS
  Filled 2019-07-23: qty 50

## 2019-07-23 NOTE — H&P (Signed)
History and Physical    Tyler [name] WUJ:[id number] DOB: 11/10/1971 DOA: 07/23/2019  PCP: Patient, No Pcp Per  Patient coming from: [facility], Home  I have personally briefly reviewed [facility]  Chief Complaint: AMS, ?OD  HPI: Tyler [name] is a 48 y.o. male with medical history significant of polysubstance abuse including heroin.  Brought in to [facility] ED just before [place] after being found down by friend last evening with nearby syringe.    ED Course: Patient with initial GCS of 8.  Given 2.4mg  narcan in ED though doesn't seem that this worked to improve mental status.  Found to have rhabdomyolysis with Creat 2.4, 3.0, and 3.1 on BMPs done in ED.  Initial hyperkalemia of 6.2 improved to 5.3 then 4.9 as of noon on BMPs.  BUN of 24 increased to 41.  Rhabdomyolysis would appear to be the cause with CPK of 72,000.  AST 2000, ALT 1000.  UDS neg, acetominophen and salycilate negative.  Initial leukocytosis of 21k, though some of this thought to be due to hemoconcentration given HGB > 19.  BP has been running on the high side.  CT head and neck were negative for acute traumatic finding.  Patient put on bicarb and saline, sounds like he got a total of 5L while over there.  Foley in place, 400cc UOP in bag currently of very dark urine.   Review of Systems: Unable to obtain due to AMS.  Past Medical History:  Diagnosis Date  . Alcohol abuse   . Back pain   . Hypertension   . Kidney disease     Past Surgical History:  Procedure Laterality Date  . MANDIBLE SURGERY    . NERVE, TENDON AND ARTERY REPAIR Left 10/07/2014   Procedure: ORIF PROXIMAL THUMB REPAIR OF EPL AND FLP TENDON MICROSCOPIC NERVE, TENDON AND RADIAL ARTERY REPAIR WITH WOUND EXPLORATION;  Surgeon: Dairl PonderMatthew Weingold, MD;  Location: [facility] OR;  Service: Orthopedics;  Laterality: Left;  OEC, MICROSCOPE     reports that he has been smoking. He has been smoking about 1.00 pack per day. He uses  smokeless tobacco. He reports current alcohol use of about 2.0 standard drinks of alcohol per week. He reports current drug use.  No Known Allergies  No family history on file. Unable to obtain due to AMS.  Prior to Admission medications   Medication Sig Start Date End Date Taking? Authorizing Provider  ibuprofen (ADVIL,MOTRIN) 200 MG tablet Take 800 mg by mouth every 6 (six) hours as needed for moderate pain.    [provider]  Multiple Vitamin (MULTIVITAMIN WITH MINERALS) TABS tablet Take 1 tablet by mouth daily.    [provider]  oxyCODONE-acetaminophen (ROXICET) 5-325 MG per tablet Take 1 tablet by mouth every 4 (four) hours as needed for severe pain. 10/07/14   Dairl PonderWeingold, Matthew, MD    Physical Exam: Vitals:   07/23/19 1846  BP: 107/69  Pulse: 75  Resp: 18  Temp: 98.5 F (36.9 C)  TempSrc: Oral  SpO2: 100%    Constitutional: NAD, calm, comfortable Eyes: PERRL, lids and conjunctivae normal ENMT: Mucous membranes are moist. Posterior pharynx clear of any exudate or lesions.Normal dentition.  Neck: normal, supple, no masses, no thyromegaly Respiratory: clear to auscultation bilaterally, no wheezing, no crackles. Normal respiratory effort. No accessory muscle use.  Cardiovascular: Regular rate and rhythm, no murmurs / rubs / gallops. No extremity edema. 2+ pedal pulses. No carotid bruits.  Abdomen: no tenderness, no masses  palpated. No hepatosplenomegaly. Bowel sounds positive.  Musculoskeletal: no clubbing / cyanosis. No joint deformity upper and lower extremities. Good ROM, no contractures. Normal muscle tone.  Skin: Bruising on right cheek. Neurologic: Patient with Cheyne-Stokes respirations.  Eye opening to voice will make eye contact nod head to questions but not respond appropriately verbally, then rapidly falls back asleep. Psychiatric: Patient altered   Labs on Admission: I have personally reviewed following labs and imaging studies  CBC: No  results for input(s): WBC, NEUTROABS, HGB, HCT, MCV, PLT in the last 168 hours. Basic Metabolic Panel: No results for input(s): NA, K, CL, CO2, GLUCOSE, BUN, CREATININE, CALCIUM, MG, PHOS in the last 168 hours. GFR: CrCl cannot be calculated (Patient's most recent lab result is older than the maximum 21 days allowed.). Liver Function Tests: No results for input(s): AST, ALT, ALKPHOS, BILITOT, PROT, ALBUMIN in the last 168 hours. No results for input(s): LIPASE, AMYLASE in the last 168 hours. No results for input(s): AMMONIA in the last 168 hours. Coagulation Profile: No results for input(s): INR, PROTIME in the last 168 hours. Cardiac Enzymes: No results for input(s): CKTOTAL, CKMB, CKMBINDEX, TROPONINI in the last 168 hours. BNP (last 3 results) No results for input(s): PROBNP in the last 8760 hours. HbA1C: No results for input(s): HGBA1C in the last 72 hours. CBG: No results for input(s): GLUCAP in the last 168 hours. Lipid Profile: No results for input(s): CHOL, HDL, LDLCALC, TRIG, CHOLHDL, LDLDIRECT in the last 72 hours. Thyroid Function Tests: No results for input(s): TSH, T4TOTAL, FREET4, T3FREE, THYROIDAB in the last 72 hours. Anemia Panel: No results for input(s): VITAMINB12, FOLATE, FERRITIN, TIBC, IRON, RETICCTPCT in the last 72 hours. Urine analysis: No results found for: COLORURINE, APPEARANCEUR, LABSPEC, PHURINE, GLUCOSEU, HGBUR, BILIRUBINUR, KETONESUR, PROTEINUR, UROBILINOGEN, NITRITE, LEUKOCYTESUR  Radiological Exams on Admission: No results found.  EKG: Independently reviewed.  Assessment/Plan Principal Problem:   Rhabdomyolysis Active Problems:   Acute kidney failure (HCC)   Acute metabolic encephalopathy   Polysubstance abuse (HCC)   Hyperkalemia   Transaminitis   HTN (hypertension)    1. Rhabdomyolysis - 1. Repeating CMP now 2. Continue bicarb gtt at 125 cc/hr 2. AKF - 1. Repeat CMP now 2. Likely due to rhabdo 3. Strict intake and output 4.  Nephro consult most likely in AM 3. Acute metabolic/toxic encephalopathy - 1. Due to OD? 2. Check ammonia 3. Check ABG given the Cheyene stokes breathing 4. [facility] 5. Consider MRI if no improvement by AM 4. Polysubstance abuse - 1. CIWA 5. Hyperkalemia - 1. Initial hyperkalemia of 6.2 was down to 4.9 on last BMP at noon today 2. EKG now 3. Tele monitor 4. Repeating CMP now 5. Repeat K at [place] and BMP at 0500 6. Transaminitis - 1. Likely secondary to rhabdo 2. Repeat CMP now and AM to trend 3. Hepatitis pnl [facility] at [facility] pending currently 7. Leukocytosis - 1. Hemoconcentration? 2. No other SIRS 3. Checking procalcitonin 4. Trend lactate ([facility]) 8. HTN - 1. PRN hydralazine ordered 2. Unable to obtain PTA med rec due to AMS  DVT prophylaxis: Heparin [place]  Code Status: Full Family Communication: No family in room Disposition Plan: Home after admit Consults called: None Admission status: Admit to inpatient  Severity of Illness: The appropriate patient status for this patient is INPATIENT. Inpatient status is judged to be reasonable and necessary in order to provide the required intensity of service to ensure the  patient's safety. The patient's presenting symptoms, physical exam findings, and initial radiographic and laboratory data in the context of their chronic comorbidities is felt to place them at high risk for further clinical deterioration. Furthermore, it is not anticipated that the patient will be medically stable for discharge from the hospital within 2 midnights of admission. The following factors support the patient status of inpatient.   IP status for acute encephalopathy, rhabdo with AKF creat 3.1.  * I certify that at the point of admission it is my clinical judgment that the patient will require inpatient hospital care spanning beyond 2 midnights from the point of admission due to high intensity of service, high risk for further  deterioration and high frequency of surveillance required.*    Demisha Nokes M. DO [facility]  How to contact the [facility] Attending or Consulting provider [place] or covering provider during after hours [place], for this patient?  1. Check the care team in [facility] and look for a) attending/consulting [facility] provider listed and b) the [facility] team listed 2. Log into [email or web address]  Amion Physician Scheduling and messaging for groups and whole hospitals  On call and physician scheduling software for group practices, residents, hospitalists and other medical providers for call, clinic, rotation and shift schedules. OnCall [employer] is a hospital-wide system for scheduling doctors and paging doctors on call. EasyPlot is for scientific plotting and data analysis.  [email or web address]  and use [place]'s universal password to access. If you do not have the password, please contact the hospital operator.  3. Locate the [facility] provider you are looking for under [facility] and page to a number that you can be directly reached. 4. If you still have difficulty reaching the provider, please page the [facility] (Director on Call) for the Hospitalists listed on amion for assistance.  07/23/2019, 7:51 PM

## 2019-07-23 NOTE — Progress Notes (Signed)
Pt orientation to unit, room and routine. Information packet given to patient/family and safety video watched.  Admission INP armband ID verified with patient/family, and in place. SR up x 2, fall risk assessment complete with Patient and family verbalizing understanding of risks associated with falls. Pt verbalizes an understanding of how to use the call bell and to call for help before getting out of bed.   No evidence of skin break down noted on exam.  Will cont to monitor and assist as needed.  Hosie Spangle, [place] 07/23/2019 6:59 PM

## 2019-07-23 NOTE — Progress Notes (Signed)
1) ordering 1gm calcium gluconate for Ca 6.8. 2) Procalcitonin noted, but no other SIRS other than leukocytosis at this time, and no obvious source of infection. 3) Will order BCx 4) if develops other SIRS or evidence of infection (fever, tachycardia, etc) then start broad spectrum ABx. 5) CPK 36k, down from 72k, will continue bicarb gtt at 125 cc/hr (got 5L thus far today already while over at [facility]).

## 2019-07-24 LAB — CBC
HCT: 46.7 % (ref 39.0–52.0)
Hemoglobin: 15.5 g/dL (ref 13.0–17.0)
MCH: 28.7 pg (ref 26.0–34.0)
MCHC: 33.2 g/dL (ref 30.0–36.0)
MCV: 86.5 fL (ref 80.0–100.0)
Platelets: 238 10*3/uL (ref 150–400)
RBC: 5.4 MIL/uL (ref 4.22–5.81)
RDW: 14 % (ref 11.5–15.5)
WBC: 20.7 10*3/uL — ABNORMAL HIGH (ref 4.0–10.5)
nRBC: 0 % (ref 0.0–0.2)

## 2019-07-24 LAB — COMPREHENSIVE METABOLIC PANEL
ALT: 1017 U/L — ABNORMAL HIGH (ref 0–44)
AST: 1453 U/L — ABNORMAL HIGH (ref 15–41)
Albumin: 2.2 g/dL — ABNORMAL LOW (ref 3.5–5.0)
Alkaline Phosphatase: 90 U/L (ref 38–126)
Anion gap: 13 (ref 5–15)
BUN: 56 mg/dL — ABNORMAL HIGH (ref 6–20)
CO2: 20 mmol/L — ABNORMAL LOW (ref 22–32)
Calcium: 7.2 mg/dL — ABNORMAL LOW (ref 8.9–10.3)
Chloride: 107 mmol/L (ref 98–111)
Creatinine, Ser: 4.13 mg/dL — ABNORMAL HIGH (ref 0.61–1.24)
GFR calc Af Amer: 18 mL/min — ABNORMAL LOW (ref >60)
GFR calc non Af Amer: 16 mL/min — ABNORMAL LOW (ref >60)
Glucose, Bld: 98 mg/dL (ref 70–99)
Potassium: 3.9 mmol/L (ref 3.5–5.1)
Sodium: 140 mmol/L (ref 135–145)
Total Bilirubin: 1.3 mg/dL — ABNORMAL HIGH (ref 0.3–1.2)
Total Protein: 5.4 g/dL — ABNORMAL LOW (ref 6.5–8.1)

## 2019-07-24 LAB — VITAMIN B12: Vitamin B-12: 1077 pg/mL — ABNORMAL HIGH (ref 180–914)

## 2019-07-24 LAB — CK: Total CK: 29041 U/L — ABNORMAL HIGH (ref 49–397)

## 2019-07-24 LAB — HIV ANTIBODY (ROUTINE TESTING W REFLEX): HIV Screen 4th Generation wRfx: NONREACTIVE

## 2019-07-24 LAB — PROCALCITONIN: Procalcitonin: 0.11 ng/mL

## 2019-07-24 LAB — TSH: TSH: 0.815 u[IU]/mL (ref 0.350–4.500)

## 2019-07-24 LAB — MRSA PCR SCREENING: MRSA by PCR: NEGATIVE

## 2019-07-24 MED ORDER — POLYVINYL ALCOHOL 1.4 % OP SOLN
1.0000 [drp] | OPHTHALMIC | Status: DC | PRN
  Filled 2019-07-24: qty 15

## 2019-07-24 MED ORDER — PHENOL 1.4 % MT LIQD: 1.0000 | OROMUCOSAL | Status: DC | PRN

## 2019-07-24 MED ORDER — LACTULOSE ENEMA
300.0000 mL | Freq: Two times a day (BID) | ORAL | Status: AC
  Administered 2019-07-24 – 2019-07-26 (×4): 300 mL via RECTAL
  Filled 2019-07-24 (×4): qty 300

## 2019-07-24 MED ORDER — HYDROCORTISONE (PERIANAL) 2.5 % EX CREA
1.0000 "application " | TOPICAL_CREAM | Freq: Four times a day (QID) | CUTANEOUS | Status: DC | PRN
  Filled 2019-07-24: qty 28.35

## 2019-07-24 MED ORDER — HYDROCORTISONE 1 % EX CREA
1.0000 "application " | TOPICAL_CREAM | Freq: Three times a day (TID) | CUTANEOUS | Status: DC | PRN
  Filled 2019-07-24: qty 28

## 2019-07-24 MED ORDER — POLYETHYLENE GLYCOL 3350 17 G PO PACK: 17.0000 g | PACK | Freq: Every day | ORAL | Status: DC | PRN

## 2019-07-24 MED ORDER — LACTULOSE 10 GM/15ML PO SOLN
20.0000 g | Freq: Two times a day (BID) | ORAL | Status: DC
  Administered 2019-07-24 – 2019-07-27 (×4): 20 g via ORAL
  Filled 2019-07-24 (×6): qty 30

## 2019-07-24 MED ORDER — LIP MEDEX EX OINT
1.0000 "application " | TOPICAL_OINTMENT | CUTANEOUS | Status: DC | PRN
  Filled 2019-07-24: qty 7

## 2019-07-24 MED ORDER — ORAL CARE MOUTH RINSE
15.0000 mL | OROMUCOSAL | Status: DC
  Administered 2019-07-24 – 2019-08-07 (×79): 15 mL via OROMUCOSAL

## 2019-07-24 MED ORDER — MUSCLE RUB 10-15 % EX CREA
1.0000 "application " | TOPICAL_CREAM | CUTANEOUS | Status: DC | PRN
  Administered 2019-09-30 – 2019-10-01 (×2): 1 via TOPICAL
  Filled 2019-07-24 (×3): qty 85

## 2019-07-24 MED ORDER — CHLORHEXIDINE GLUCONATE 0.12% ORAL RINSE (MEDLINE KIT)
15.0000 mL | Freq: Two times a day (BID) | OROMUCOSAL | Status: DC
  Administered 2019-07-24 – 2019-08-24 (×45): 15 mL via OROMUCOSAL

## 2019-07-24 MED ORDER — LORATADINE 10 MG PO TABS: 10.0000 mg | ORAL_TABLET | Freq: Every day | ORAL | Status: DC | PRN

## 2019-07-24 MED ORDER — IPRATROPIUM-ALBUTEROL 0.5-2.5 (3) MG/3ML IN SOLN: 3.0000 mL | RESPIRATORY_TRACT | Status: DC | PRN

## 2019-07-24 MED ORDER — GUAIFENESIN-DM 100-10 MG/5ML PO SYRP
5.0000 mL | ORAL_SOLUTION | ORAL | Status: DC | PRN
  Administered 2019-10-22: 11:00:00 5 mL via ORAL

## 2019-07-24 MED ORDER — ALUM & MAG HYDROXIDE-SIMETH 200-200-20 MG/5ML PO SUSP: 30.0000 mL | ORAL | Status: DC | PRN

## 2019-07-24 MED ORDER — SENNOSIDES-DOCUSATE SODIUM 8.6-50 MG PO TABS: 2.0000 | ORAL_TABLET | Freq: Every evening | ORAL | Status: DC | PRN

## 2019-07-24 MED ORDER — DILTIAZEM HCL 25 MG/5ML IV SOLN
10.0000 mg | Freq: Once | INTRAVENOUS | Status: AC
  Administered 2019-07-24: 10 mg via INTRAVENOUS
  Filled 2019-07-24: qty 5

## 2019-07-24 MED ORDER — SALINE SPRAY 0.65 % NA SOLN
1.0000 | NASAL | Status: DC | PRN
  Filled 2019-07-24: qty 44

## 2019-07-24 NOTE — Progress Notes (Signed)
PROGRESS NOTE    Tyler [name]  ZHY:[id number] DOB: Apr 18, 1971 DOA: 07/23/2019 PCP: Patient, No Pcp Per   Brief Narrative:  48 year old with history of polysubstance abuse including heroin was initially brought to [facility] ER after being found down with a syringe nearby.  He was given Narcan with minimal improvement.  Found in severe rhabdomyolysis with acute kidney injury and hyperkalemia.  He was started on fluids including bicarb drip and transferred to [facility] for further care management.   Assessment & Plan:   Principal Problem:   Rhabdomyolysis Active Problems:   Acute kidney failure (HCC)   Acute metabolic encephalopathy   Polysubstance abuse (HCC)   Hyperkalemia   Transaminitis   HTN (hypertension)  Severe rhabdomyolysis with acute kidney injury - No obvious signs of trauma noted.  CPK is trending down, will continue to trend this daily. - Continue IV fluids-bicarb drip.  Monitor renal function. - Foley in place for accurate input and output, will discontinue this.  Acute metabolic encephalopathy -CT of the head and neck is negative for any traumatic finding.  UDS-negative.  Tylenol and salicylate levels-negative. - Ammonia-55.  Start lactulose twice daily. -No focal neuro deficits, if necessary we will get MRI brain -Check TSH, B12, folate  Transaminitis - AST> ALT, concern for alcoholic hepatitis.  No abdominal Pain. Provide supportive care.  If does not start downtrending, will obtain right upper quadrant ultrasound.  Alcohol use per history -Patient unable to tell me if he drinks alcohol daily or not.  We will place him on withdrawal protocol.  Leukocytosis -No obvious evidence of infection.  Likely reactive.  Cultures done, hold off on antibiotics.  Procalcitonin levels negative.  Hyperkalemia, resolved -This is 3.9 today.  Essential hypertension -Not on any home medications.  DVT prophylaxis: Heparin Code Status: Full code Family Communication: None  at bedside Disposition Plan: Transfer patient to [place].  Maintain hospital stay until his mentation is improved, rhabdomyolysis and acute kidney injury has resolved.  Consultants:   None  Procedures:   None  Antimicrobials:   None   Subjective: Wakes up to his name but does not carry on any meaningful conversation.  Moving all his extremities adequately.  Review of Systems Otherwise negative except as per HPI, including: General: Denies fever, chills, night sweats or unintended weight loss. Resp: Denies cough, wheezing, shortness of breath. Cardiac: Denies chest pain, palpitations, orthopnea, paroxysmal nocturnal dyspnea. GI: Denies abdominal pain, nausea, vomiting, diarrhea or constipation GU: Denies dysuria, frequency, hesitancy or incontinence MS: Denies muscle aches, joint pain or swelling Neuro: Denies headache, neurologic deficits (focal weakness, numbness, tingling), abnormal gait Psych: Denies anxiety, depression, SI/HI/AVH Skin: Denies new rashes or lesions ID: Denies sick contacts, exotic exposures, travel  Objective: Vitals:   07/24/19 0515 07/24/19 0624 07/24/19 0722 07/24/19 0748  BP: 136/88  (!) 172/113 (!) 160/100  Pulse: 86  92 98  Resp:   (!) 23 18  Temp:   97.7 F (36.5 C)   TempSrc:   Axillary   SpO2: 99%  97% 99%  Weight:  73 kg      Intake/Output Summary (Last 24 hours) at 07/24/2019 0956 Last data filed at 07/24/2019 0713 Gross per 24 hour  Intake 728.62 ml  Output 400 ml  Net 328.62 ml   Filed Weights   07/24/19 0624  Weight: 73 kg    Examination:  General exam: Appears calm and comfortable  Respiratory system: Clear to auscultation. Respiratory effort normal. Cardiovascular system: S1 & S2 heard, RRR. No  JVD, murmurs, rubs, gallops or clicks. No pedal edema. Gastrointestinal system: Abdomen is nondistended, soft and nontender. No organomegaly or masses felt. Normal bowel sounds heard. Central nervous system: Alert to his name but  no focal neuro deficits Extremities: Symmetric 5 x 5 power. Skin: No rashes, lesions or ulcers Psychiatry: Difficult to assess    Data Reviewed:   CBC: Recent Labs  Lab 07/23/19 1939 07/24/19 0159  WBC 21.6* 20.7*  HGB 15.4 15.5  HCT 45.8 46.7  MCV 86.4 86.5  PLT 212 161   Basic Metabolic Panel: Recent Labs  Lab 07/23/19 1939 07/23/19 2300 07/24/19 0159  NA 137  --  140  K 4.5 4.0 3.9  CL 109  --  107  CO2 16*  --  20*  GLUCOSE 133*  --  98  BUN 51*  --  56*  CREATININE 3.84*  --  4.13*  CALCIUM 6.8*  --  7.2*   GFR: CrCl cannot be calculated (Unknown ideal weight.). Liver Function Tests: Recent Labs  Lab 07/23/19 1939 07/24/19 0159  AST 1,551* 1,453*  ALT 977* 1,017*  ALKPHOS 90 90  BILITOT 1.3* 1.3*  PROT 5.0* 5.4*  ALBUMIN 2.2* 2.2*   No results for input(s): LIPASE, AMYLASE in the last 168 hours. Recent Labs  Lab 07/23/19 1947  AMMONIA 55*   Coagulation Profile: No results for input(s): INR, PROTIME in the last 168 hours. Cardiac Enzymes: Recent Labs  Lab 07/23/19 1939 07/24/19 0159  CKTOTAL 36,151* 29,041*   BNP (last 3 results) No results for input(s): PROBNP in the last 8760 hours. HbA1C: No results for input(s): HGBA1C in the last 72 hours. CBG: No results for input(s): GLUCAP in the last 168 hours. Lipid Profile: No results for input(s): CHOL, HDL, LDLCALC, TRIG, CHOLHDL, LDLDIRECT in the last 72 hours. Thyroid Function Tests: No results for input(s): TSH, T4TOTAL, FREET4, T3FREE, THYROIDAB in the last 72 hours. Anemia Panel: No results for input(s): VITAMINB12, FOLATE, FERRITIN, TIBC, IRON, RETICCTPCT in the last 72 hours. Sepsis Labs: Recent Labs  Lab 07/23/19 1947 07/23/19 1955 07/23/19 2300 07/24/19 0159  PROCALCITON 13.58  --   --  0.11  LATICACIDVEN  --  1.8 1.9  --     Recent Results (from the past 240 hour(s))  SARS Coronavirus 2 [facility] order, Performed in [place] hospital lab)     Status: None    Collection Time: 07/23/19  9:57 PM  Result Value Ref Range Status   SARS Coronavirus 2 NEGATIVE NEGATIVE Final    Comment: (NOTE) If result is NEGATIVE SARS-CoV-2 target nucleic acids are NOT DETECTED. The SARS-CoV-2 RNA is generally detectable in upper and lower  respiratory specimens during the acute phase of infection. The lowest  concentration of SARS-CoV-2 viral copies this assay can detect is 250  copies / mL. A negative result does not preclude SARS-CoV-2 infection  and should not be used as the sole basis for treatment or other  patient management decisions.  A negative result may occur with  improper specimen collection / handling, submission of specimen other  than nasopharyngeal swab, presence of viral mutation(s) within the  areas targeted by this assay, and inadequate number of viral copies  (<250 copies / mL). A negative result must be combined with clinical  observations, patient history, and epidemiological information. If result is POSITIVE SARS-CoV-2 target nucleic acids are DETECTED. The SARS-CoV-2 RNA is generally detectable in upper and lower  respiratory specimens dur ing the acute phase of infection.  Positive  results are indicative of active infection with SARS-CoV-2.  Clinical  correlation with patient history and other diagnostic information is  necessary to determine patient infection status.  Positive results do  not rule out bacterial infection or co-infection with other viruses. If result is PRESUMPTIVE POSTIVE SARS-CoV-2 nucleic acids MAY BE PRESENT.   A presumptive positive result was obtained on the submitted specimen  and confirmed on repeat testing.  While 2019 novel coronavirus  (SARS-CoV-2) nucleic acids may be present in the submitted sample  additional confirmatory testing may be necessary for epidemiological  and / or clinical management purposes  to differentiate between  SARS-CoV-2 and other Sarbecovirus currently known to infect humans.   If clinically indicated additional testing with an alternate test  methodology [phone]) is advised. The SARS-CoV-2 RNA is generally  detectable in upper and lower respiratory sp ecimens during the acute  phase of infection. The expected result is Negative. Fact Sheet for Patients:  StrictlyIdeas.no Fact Sheet for Healthcare Providers: BankingDealers.co.za This test is not yet approved or cleared by the [place] FDA and has been authorized for detection and/or diagnosis of SARS-CoV-2 by FDA under an Emergency Use Authorization (EUA).  This EUA will remain in effect (meaning this test can be used) for the duration of the COVID-19 declaration under Section 564(b)(1) of the Act, 21 U.S.C. section 360bbb-3(b)(1), unless the authorization is terminated or revoked sooner. Performed at [facility], [place] [address] [place], [place] [address]   MRSA PCR Screening     Status: None   Collection Time: 07/24/19  2:10 AM   Specimen: Nasal Mucosa; Nasopharyngeal  Result Value Ref Range Status   MRSA by PCR NEGATIVE NEGATIVE Final    Comment: Performed at [facility], [place] [address] [place], [place] [address]         Radiology Studies: Dg Chest Port 1 View  Result Date: 07/23/2019 CLINICAL DATA:  Altered mental status EXAM: PORTABLE CHEST 1 VIEW COMPARISON:  08/29/2016 and prior radiographs FINDINGS: The cardiomediastinal silhouette is unremarkable. There is no evidence of focal airspace disease, pulmonary edema, suspicious pulmonary nodule/mass, pleural effusion, or pneumothorax. No acute bony abnormalities are identified. IMPRESSION: No active disease. Electronically Signed   By: Margarette Canada M.D.   On: 07/23/2019 20:13        Scheduled Meds: . chlorhexidine gluconate (MEDLINE KIT)  15 mL Mouth Rinse BID  . folic acid  1 mg Oral Daily  . heparin  5,000 Units Subcutaneous Q8H  . mouth rinse  15 mL Mouth Rinse 10 times per  day  . multivitamin with minerals  1 tablet Oral Daily  . thiamine  100 mg Oral Daily   Or  . thiamine  100 mg Intravenous Daily   Continuous Infusions: .  sodium bicarbonate (isotonic) infusion in sterile water 125 mL/hr at 07/24/19 0713     LOS: 1 day   Time spent= 35 mins    Mareli Antunes Arsenio Loader, MD [facility]  If 7PM-7AM, please contact night-coverage [email or web address] 07/24/2019, 9:56 AM

## 2019-07-25 ENCOUNTER — Inpatient Hospital Stay (HOSPITAL_COMMUNITY): Payer: Medicaid Other

## 2019-07-25 DIAGNOSIS — R4182 Altered mental status, unspecified: Secondary | ICD-10-CM

## 2019-07-25 LAB — COMPREHENSIVE METABOLIC PANEL
ALT: 668 U/L — ABNORMAL HIGH (ref 0–44)
AST: 927 U/L — ABNORMAL HIGH (ref 15–41)
Albumin: 2.3 g/dL — ABNORMAL LOW (ref 3.5–5.0)
Alkaline Phosphatase: 120 U/L (ref 38–126)
Anion gap: 14 (ref 5–15)
BUN: 69 mg/dL — ABNORMAL HIGH (ref 6–20)
CO2: 31 mmol/L (ref 22–32)
Calcium: 7.7 mg/dL — ABNORMAL LOW (ref 8.9–10.3)
Chloride: 96 mmol/L — ABNORMAL LOW (ref 98–111)
Creatinine, Ser: 5.64 mg/dL — ABNORMAL HIGH (ref 0.61–1.24)
GFR calc Af Amer: 13 mL/min — ABNORMAL LOW (ref >60)
GFR calc non Af Amer: 11 mL/min — ABNORMAL LOW (ref >60)
Glucose, Bld: 97 mg/dL (ref 70–99)
Potassium: 3.9 mmol/L (ref 3.5–5.1)
Sodium: 141 mmol/L (ref 135–145)
Total Bilirubin: 1.9 mg/dL — ABNORMAL HIGH (ref 0.3–1.2)
Total Protein: 5.8 g/dL — ABNORMAL LOW (ref 6.5–8.1)

## 2019-07-25 LAB — CBC
HCT: 41.4 % (ref 39.0–52.0)
Hemoglobin: 13.6 g/dL (ref 13.0–17.0)
MCH: 28.7 pg (ref 26.0–34.0)
MCHC: 32.9 g/dL (ref 30.0–36.0)
MCV: 87.3 fL (ref 80.0–100.0)
Platelets: 217 10*3/uL (ref 150–400)
RBC: 4.74 MIL/uL (ref 4.22–5.81)
RDW: 14.2 % (ref 11.5–15.5)
WBC: 12.8 10*3/uL — ABNORMAL HIGH (ref 4.0–10.5)
nRBC: 0 % (ref 0.0–0.2)

## 2019-07-25 LAB — FOLATE RBC
Folate, Hemolysate: 360 ng/mL
Folate, RBC: 898 ng/mL (ref >498)
Hematocrit: 40.1 % (ref 37.5–51.0)

## 2019-07-25 LAB — MAGNESIUM: Magnesium: 2.1 mg/dL (ref 1.7–2.4)

## 2019-07-25 LAB — CK: Total CK: 27691 U/L — ABNORMAL HIGH (ref 49–397)

## 2019-07-25 LAB — PROCALCITONIN: Procalcitonin: 5.64 ng/mL

## 2019-07-25 LAB — AMMONIA: Ammonia: 21 umol/L (ref 9–35)

## 2019-07-25 MED ORDER — SODIUM CHLORIDE 0.9 % IV SOLN
INTRAVENOUS | Status: AC
  Administered 2019-07-25 (×3): via INTRAVENOUS

## 2019-07-25 MED ORDER — SODIUM CHLORIDE 0.9 % IV SOLN
1.0000 g | INTRAVENOUS | Status: AC
  Administered 2019-07-25 – 2019-07-29 (×5): 1 g via INTRAVENOUS
  Filled 2019-07-25 (×5): qty 10

## 2019-07-25 NOTE — Progress Notes (Signed)
PROGRESS NOTE    Maclin Guerrette  GUY:[id number] DOB: 11/05/1971 DOA: 07/23/2019 PCP: Patient, No Pcp Per   Brief Narrative:  48 year old with history of polysubstance abuse including heroin was initially brought to [facility] ER after being found down with a syringe nearby.  He was given Narcan with minimal improvement.  Found in severe rhabdomyolysis with acute kidney injury and hyperkalemia.  He was started on fluids including bicarb drip and transferred to [facility] for further care management.   Assessment & Plan:   Principal Problem:   Rhabdomyolysis Active Problems:   Acute kidney failure (HCC)   Acute metabolic encephalopathy   Polysubstance abuse (HCC)   Hyperkalemia   Transaminitis   HTN (hypertension)  Severe rhabdomyolysis with acute kidney injury AKI, worsening. - Although rhabdomyolysis is improving, CPK trending down.  Creatinine is trending upwards. - Bicarb continues to trend up, H 31.  Stop bicarb drip, switch over to normal saline. - Renal ultrasound.  Acute metabolic encephalopathy -CT of the head and neck is negative for any traumatic finding.  UDS-negative.  Tylenol and salicylate levels-negative. - Ammonia-55.  Start lactulose twice daily. -No focal neuro deficits,  -TSH, B12-within normal limits, folate-pending - MRI brain-ordered -EEG-ordered  Transaminitis trending down - AST> ALT, concern for alcoholic hepatitis.  No abdominal Pain. Provide supportive care.  Improving.  Will consider ultrasound if necessary  Alcohol use per history -Patient unable to tell me if he drinks alcohol daily or not.  We will place him on withdrawal protocol.  Leukocytosis, improving Urinary tract infection, present on admission -UA ended up being positive which was collected at the time of admission, will start patient on IV Rocephin for 5 days. -Urine culture ordered.  Hyperkalemia, resolved -Remained stable  Essential hypertension -Not on any home medications.   DVT prophylaxis: Heparin Code Status: Full code Family Communication: None at bedside Disposition Plan: Maintain inpatient status on MedSurg.  Continues to have worsening renal function.  Multiple ongoing issues including rhabdomyolysis, acute kidney injury and urinary tract infection.  Also undergoing evaluation for acute mental status change.  Patient is not stable for discharge at the moment.  Consultants:   None  Procedures:   None  Antimicrobials:   None   Subjective: Patient is awake and very responsive to physical stimuli.  Barely responds to his name.  Grossly moving all the extremities.  Does not participate in conversation.  Review of Systems Otherwise negative except as per HPI, including: Unable to obtain full review of system Objective: Vitals:   07/24/19 2138 07/24/19 2234 07/25/19 0429 07/25/19 0440  BP: (!) 180/100 (!) 160/103 (!) 162/105 (!) 147/97  Pulse: 88 81 (!) 104 91  Resp: _0 Temp: 98.1 F (36.7 C)  98 F (36.7 C) 98 F (36.7 C)  TempSrc: Oral  Axillary Oral  SpO2: 100%  96% 100%  Weight:        Intake/Output Summary (Last 24 hours) at 07/25/2019 1030 Last data filed at 07/25/2019 0700 Gross per 24 hour  Intake 3179.94 ml  Output 1000 ml  Net 2179.94 ml   Filed Weights   07/24/19 0624  Weight: 73 kg    Examination:  General exam: Appears calm and comfortable  Respiratory system: Clear to auscultation. Respiratory effort normal. Cardiovascular system: S1 & S2 heard, RRR. No JVD, murmurs, rubs, gallops or clicks. No pedal edema. Gastrointestinal system: Abdomen is nondistended, soft and nontender. No organomegaly or masses felt. Normal bowel sounds heard. Central nervous system: Responds  to physical stimuli.  Barely responds to his name.  No focal neuro deficits noted grossly as he is moving all the extremities. Extremities: Symmetric 5 x 5 power. Skin: No rashes, lesions or ulcers Psychiatry: Difficult to assess given his  mentation  Data Reviewed:   CBC: Recent Labs  Lab 07/23/19 1939 07/24/19 0159 07/25/19 0356  WBC 21.6* 20.7* 12.8*  HGB 15.4 15.5 13.6  HCT 45.8 46.7 41.4  MCV 86.4 86.5 87.3  PLT 212 238 982   Basic Metabolic Panel: Recent Labs  Lab 07/23/19 1939 07/23/19 2300 07/24/19 0159 07/25/19 0356  NA 137  --  140 141  K 4.5 4.0 3.9 3.9  CL 109  --  107 96*  CO2 16*  --  20* 31  GLUCOSE 133*  --  98 97  BUN 51*  --  56* 69*  CREATININE 3.84*  --  4.13* 5.64*  CALCIUM 6.8*  --  7.2* 7.7*  MG  --   --   --  2.1   GFR: CrCl cannot be calculated (Unknown ideal weight.). Liver Function Tests: Recent Labs  Lab 07/23/19 1939 07/24/19 0159 07/25/19 0356  AST 1,551* 1,453* 927*  ALT 977* 1,017* 668*  ALKPHOS 90 90 120  BILITOT 1.3* 1.3* 1.9*  PROT 5.0* 5.4* 5.8*  ALBUMIN 2.2* 2.2* 2.3*   No results for input(s): LIPASE, AMYLASE in the last 168 hours. Recent Labs  Lab 07/23/19 1947 07/25/19 0356  AMMONIA 55* 21   Coagulation Profile: No results for input(s): INR, PROTIME in the last 168 hours. Cardiac Enzymes: Recent Labs  Lab 07/23/19 1939 07/24/19 0159 07/25/19 0356  CKTOTAL 36,151* 29,041* 27,691*   BNP (last 3 results) No results for input(s): PROBNP in the last 8760 hours. HbA1C: No results for input(s): HGBA1C in the last 72 hours. CBG: No results for input(s): GLUCAP in the last 168 hours. Lipid Profile: No results for input(s): CHOL, HDL, LDLCALC, TRIG, CHOLHDL, LDLDIRECT in the last 72 hours. Thyroid Function Tests: Recent Labs    07/24/19 0828  TSH 0.815   Anemia Panel: Recent Labs    07/24/19 1133  VITAMINB12 1,077*   Sepsis Labs: Recent Labs  Lab 07/23/19 1947 07/23/19 1955 07/23/19 2300 07/24/19 0159 07/25/19 0356  PROCALCITON 13.58  --   --  0.11 5.64  LATICACIDVEN  --  1.8 1.9  --   --     Recent Results (from the past 240 hour(s))  SARS Coronavirus 2 [facility] order, Performed in [place] hospital lab)     Status: None    Collection Time: 07/23/19  9:57 PM  Result Value Ref Range Status   SARS Coronavirus 2 NEGATIVE NEGATIVE Final    Comment: (NOTE) If result is NEGATIVE SARS-CoV-2 target nucleic acids are NOT DETECTED. The SARS-CoV-2 RNA is generally detectable in upper and lower  respiratory specimens during the acute phase of infection. The lowest  concentration of SARS-CoV-2 viral copies this assay can detect is 250  copies / mL. A negative result does not preclude SARS-CoV-2 infection  and should not be used as the sole basis for treatment or other  patient management decisions.  A negative result may occur with  improper specimen collection / handling, submission of specimen other  than nasopharyngeal swab, presence of viral mutation(s) within the  areas targeted by this assay, and inadequate number of viral copies  (<250 copies / mL). A negative result must be combined with clinical  observations, patient history, and epidemiological information. If result  is POSITIVE SARS-CoV-2 target nucleic acids are DETECTED. The SARS-CoV-2 RNA is generally detectable in upper and lower  respiratory specimens dur ing the acute phase of infection.  Positive  results are indicative of active infection with SARS-CoV-2.  Clinical  correlation with patient history and other diagnostic information is  necessary to determine patient infection status.  Positive results do  not rule out bacterial infection or co-infection with other viruses. If result is PRESUMPTIVE POSTIVE SARS-CoV-2 nucleic acids MAY BE PRESENT.   A presumptive positive result was obtained on the submitted specimen  and confirmed on repeat testing.  While 2019 novel coronavirus  (SARS-CoV-2) nucleic acids may be present in the submitted sample  additional confirmatory testing may be necessary for epidemiological  and / or clinical management purposes  to differentiate between  SARS-CoV-2 and other Sarbecovirus currently known to infect humans.   If clinically indicated additional testing with an alternate test  methodology [phone]) is advised. The SARS-CoV-2 RNA is generally  detectable in upper and lower respiratory sp ecimens during the acute  phase of infection. The expected result is Negative. Fact Sheet for Patients:  StrictlyIdeas.no Fact Sheet for Healthcare Providers: BankingDealers.co.za This test is not yet approved or cleared by the [place] FDA and has been authorized for detection and/or diagnosis of SARS-CoV-2 by FDA under an Emergency Use Authorization (EUA).  This EUA will remain in effect (meaning this test can be used) for the duration of the COVID-19 declaration under Section 564(b)(1) of the Act, 21 U.S.C. section 360bbb-3(b)(1), unless the authorization is terminated or revoked sooner. Performed at [facility], [place] [address], [place], [place] [address]   Culture, blood (routine x 2)     Status: None (Preliminary result)   Collection Time: 07/23/19 10:41 PM   Specimen: BLOOD LEFT HAND  Result Value Ref Range Status   Specimen Description BLOOD LEFT HAND  Final   Special Requests   Final    AEROBIC BOTTLE ONLY Blood Culture results may not be optimal due to an inadequate volume of blood received in culture bottles   Culture   Final    NO GROWTH 1 DAY Performed at [facility], [place] [address], [place], [place] [address]    Report Status PENDING  Incomplete  Culture, blood (routine x 2)     Status: None (Preliminary result)   Collection Time: 07/23/19 10:50 PM   Specimen: BLOOD  Result Value Ref Range Status   Specimen Description BLOOD LEFT ANTECUBITAL  Final   Special Requests   Final    AEROBIC BOTTLE ONLY Blood Culture results may not be optimal due to an inadequate volume of blood received in culture bottles   Culture   Final    NO GROWTH 1 DAY Performed at [facility], [place] [address], [place], [place] [address]    Report  Status PENDING  Incomplete  MRSA PCR Screening     Status: None   Collection Time: 07/24/19  2:10 AM   Specimen: Nasal Mucosa; Nasopharyngeal  Result Value Ref Range Status   MRSA by PCR NEGATIVE NEGATIVE Final    Comment: Performed at [facility], [place] [address] [place], [place] [address]         Radiology Studies: US Renal  Result Date: 07/25/2019 CLINICAL DATA:  48 year old male with acute renal insufficiency. Polysubstance abuse with severe rhabdomyolysis. EXAM: RENAL / URINARY TRACT ULTRASOUND COMPLETE COMPARISON:  CT of the abdomen pelvis dated 04/06/2014 FINDINGS:  Evaluation is limited inability of patient to cooperate with exam. Right Kidney: Renal measurements: 11.0 x 5.8 x 6.0 cm = volume: 200 mL. Normal echogenicity. No hydronephrosis or shadowing stone. Left Kidney: Renal measurements: 11 x 5.3 x 5.1 cm = volume: 152 mL. Normal echogenicity. No hydronephrosis or shadowing stone. Bladder: Appears normal for degree of bladder distention. IMPRESSION: Unremarkable renal ultrasound. Electronically Signed   By: Anner Crete M.D.   On: 07/25/2019 08:39   Dg Chest Port 1 View  Result Date: 07/23/2019 CLINICAL DATA:  Altered mental status EXAM: PORTABLE CHEST 1 VIEW COMPARISON:  08/29/2016 and prior radiographs FINDINGS: The cardiomediastinal silhouette is unremarkable. There is no evidence of focal airspace disease, pulmonary edema, suspicious pulmonary nodule/mass, pleural effusion, or pneumothorax. No acute bony abnormalities are identified. IMPRESSION: No active disease. Electronically Signed   By: Margarette Canada M.D.   On: 07/23/2019 20:13        Scheduled Meds: . chlorhexidine gluconate (MEDLINE KIT)  15 mL Mouth Rinse BID  . folic acid  1 mg Oral Daily  . heparin  5,000 Units Subcutaneous Q8H  . lactulose  20 g Oral BID  . lactulose  300 mL Rectal BID  . mouth rinse  15 mL Mouth Rinse 10 times per day  . multivitamin with minerals  1 tablet Oral Daily  . thiamine   100 mg Oral Daily   Or  . thiamine  100 mg Intravenous Daily   Continuous Infusions: . sodium chloride 100 mL/hr at 07/25/19 0915  . cefTRIAXone (ROCEPHIN)  IV 1 g (07/25/19 0921)     LOS: 2 days   Time spent= 38 mins     Arsenio Loader, MD [facility]  If 7PM-7AM, please contact night-coverage [email or web address] 07/25/2019, 10:30 AM

## 2019-07-25 NOTE — Progress Notes (Signed)
EEG completed, results pending. 

## 2019-07-25 NOTE — Procedures (Signed)
Patient Name: Tyler [name]  MRN: [id number]  Epilepsy Attending: Lora Havens  Referring Physician/Provider:  Dr Gerlean Ren Date: 07/25/2019  Duration: 25.22 mins  Patient history: 47 year old male with history of bilateral subdural hematoma and now with altered mental status.  EEG to evaluate for seizures.  Level of alertness: letahrgic  AEDs during EEG study: Ativan  Technical aspects: This EEG study was done with scalp electrodes positioned according to the 10-20 International system of electrode placement. Electrical activity was acquired at a sampling rate of 500Hz  and reviewed with a high frequency filter of 70Hz  and a low frequency filter of 1Hz . EEG data were recorded continuously and digitally stored.   DESCRIPTION: EEG showed continuous generalized 2 to 6 Hz theta- delta slowing, at times with triphasic morphology. There was also intermittent generalized 2 to 3 Hz rhythmic delta slowing.  EEG was reactive to vocal and tactile stimuli.  However no clear posterior dominant rhythm was seen. Hyperventilation and photic stimulation were not performed.  IMPRESSION: This study is suggestive of moderate diffuse encephalopathy which is nonspecific to etiology . No seizures or epileptiform discharges were seen throughout the recording.

## 2019-07-26 ENCOUNTER — Inpatient Hospital Stay (HOSPITAL_COMMUNITY): Payer: Medicaid Other

## 2019-07-26 ENCOUNTER — Other Ambulatory Visit (HOSPITAL_COMMUNITY): Payer: Medicaid Other

## 2019-07-26 DIAGNOSIS — I639 Cerebral infarction, unspecified: Secondary | ICD-10-CM

## 2019-07-26 LAB — COMPREHENSIVE METABOLIC PANEL
ALT: 508 U/L — ABNORMAL HIGH (ref 0–44)
AST: 710 U/L — ABNORMAL HIGH (ref 15–41)
Albumin: 2.6 g/dL — ABNORMAL LOW (ref 3.5–5.0)
Alkaline Phosphatase: 118 U/L (ref 38–126)
Anion gap: 19 — ABNORMAL HIGH (ref 5–15)
BUN: 72 mg/dL — ABNORMAL HIGH (ref 6–20)
CO2: 27 mmol/L (ref 22–32)
Calcium: 8.1 mg/dL — ABNORMAL LOW (ref 8.9–10.3)
Chloride: 96 mmol/L — ABNORMAL LOW (ref 98–111)
Creatinine, Ser: 5.79 mg/dL — ABNORMAL HIGH (ref 0.61–1.24)
GFR calc Af Amer: 12 mL/min — ABNORMAL LOW (ref >60)
GFR calc non Af Amer: 10 mL/min — ABNORMAL LOW (ref >60)
Glucose, Bld: 99 mg/dL (ref 70–99)
Potassium: 3.3 mmol/L — ABNORMAL LOW (ref 3.5–5.1)
Sodium: 142 mmol/L (ref 135–145)
Total Bilirubin: 3.5 mg/dL — ABNORMAL HIGH (ref 0.3–1.2)
Total Protein: 6.7 g/dL (ref 6.5–8.1)

## 2019-07-26 LAB — CBC
HCT: 42 % (ref 39.0–52.0)
Hemoglobin: 13.8 g/dL (ref 13.0–17.0)
MCH: 28.8 pg (ref 26.0–34.0)
MCHC: 32.9 g/dL (ref 30.0–36.0)
MCV: 87.5 fL (ref 80.0–100.0)
Platelets: 208 10*3/uL (ref 150–400)
RBC: 4.8 MIL/uL (ref 4.22–5.81)
RDW: 14.2 % (ref 11.5–15.5)
WBC: 11.8 10*3/uL — ABNORMAL HIGH (ref 4.0–10.5)
nRBC: 0 % (ref 0.0–0.2)

## 2019-07-26 LAB — URINE CULTURE

## 2019-07-26 LAB — CK: Total CK: 21050 U/L — ABNORMAL HIGH (ref 49–397)

## 2019-07-26 LAB — MAGNESIUM: Magnesium: 2.1 mg/dL (ref 1.7–2.4)

## 2019-07-26 LAB — GLUCOSE, CAPILLARY
Glucose-Capillary: 83 mg/dL (ref 70–99)
Glucose-Capillary: 78 mg/dL (ref 70–99)

## 2019-07-26 MED ORDER — ASPIRIN 325 MG PO TABS
325.0000 mg | ORAL_TABLET | Freq: Every day | ORAL | Status: DC
  Administered 2019-07-29 – 2019-12-16 (×129): 325 mg via ORAL
  Filled 2019-07-26 (×138): qty 1

## 2019-07-26 MED ORDER — VITAMIN B-12 1000 MCG PO TABS: 1000.0000 ug | ORAL_TABLET | Freq: Every day | ORAL | Status: DC

## 2019-07-26 MED ORDER — HALOPERIDOL LACTATE 5 MG/ML IJ SOLN: 5.0000 mg | Freq: Once | INTRAMUSCULAR | Status: DC

## 2019-07-26 MED ORDER — ASPIRIN 300 MG RE SUPP
300.0000 mg | Freq: Every day | RECTAL | Status: DC
  Administered 2019-07-26 – 2019-07-28 (×3): 300 mg via RECTAL
  Filled 2019-07-26 (×7): qty 1

## 2019-07-26 MED ORDER — SODIUM CHLORIDE 0.9 % IV SOLN
INTRAVENOUS | Status: DC
  Administered 2019-07-27: 04:00:00 via INTRAVENOUS

## 2019-07-26 MED ORDER — LORAZEPAM 2 MG/ML IJ SOLN
1.0000 mg | Freq: Once | INTRAMUSCULAR | Status: AC
  Administered 2019-07-27: 1 mg via INTRAVENOUS
  Filled 2019-07-26: qty 1

## 2019-07-26 MED ORDER — STROKE: EARLY STAGES OF RECOVERY BOOK
Freq: Once | Status: DC
  Filled 2019-07-26 (×3): qty 1

## 2019-07-26 MED ORDER — POTASSIUM CHLORIDE 10 MEQ/100ML IV SOLN
10.0000 meq | INTRAVENOUS | Status: AC
  Administered 2019-07-26 (×4): 10 meq via INTRAVENOUS
  Filled 2019-07-26 (×4): qty 100

## 2019-07-26 NOTE — Evaluation (Signed)
Occupational Therapy Evaluation Patient Details Name: Tyler [name] MRN: [id number] DOB: 06/15/1971 Today's Date: 07/26/2019    History of Present Illness Pt is a 48 y/o male transferred from [facility] for unresponsiveness, likely secondary to substance abuse. Pt with rhabdomyolysis and AKI. EEG revealed moderate diffuse encephalopathy. PMH includes polysubstance abuse and HTN.    Clinical Impression   Eval limited due to pt being lethargic and agitated with attempted movement. Pt only opening eyes occassionally with grunts/moans when asked questions. Pt observed scooting up in bed and rolling to L side with eyes closed. OT attempted to assist pt to sit EOB and pt bringing LEs back onto bed when moved towards EOB. Unable to properly assess cognition, strength and balance. Pt would benefit from acute OT services to maximize level of function and safety. Will continue to assess at next OT visit    Follow Up Recommendations  SNF    Equipment Recommendations  None recommended by OT    Recommendations for Other Services       Precautions / Restrictions Precautions Precautions: Fall Restrictions Weight Bearing Restrictions: No      Mobility Bed Mobility Overal bed mobility: Needs Assistance Bed Mobility: Rolling Rolling: Max assist         General bed mobility comments: pt putting legs back onto bed when attmpting to assit pt to sit EOB. Pt became agitated  Transfers                 General transfer comment: unable to assess at this time    Balance                                           ADL either performed or assessed with clinical judgement   ADL Overall ADL's : Needs assistance/impaired                                       General ADL Comments: Pt resistive during eval and unable to properly assess     Vision   Additional Comments: unable to properly assess     Perception     Praxis      Pertinent  Vitals/Pain Pain Assessment: Faces Faces Pain Scale: Hurts little more Pain Location: generalized Pain Descriptors / Indicators: Moaning;Grimacing;Guarding Pain Intervention(s): Limited activity within patient's tolerance;Monitored during session;Repositioned     Hand Dominance (uncertain at this time)   Extremity/Trunk Assessment Upper Extremity Assessment Upper Extremity Assessment: (unable to properly assess)   Lower Extremity Assessment Lower Extremity Assessment: Defer to PT evaluation       Communication Communication Communication: Other (comment)(moaning and grunting)   Cognition Arousal/Alertness: Lethargic Behavior During Therapy: Agitated;Restless Overall Cognitive Status: No family/caregiver present to determine baseline cognitive functioning                                 General Comments: Pt very lethargic during session, opened eyes occasionally. When attempting to sit pt up at EOB, pt becoming resistive and swatting OT away   General Comments       Exercises     Shoulder Instructions      Home Living Family/patient expects to be discharged to:: Unsure  Additional Comments: Pt lethargic and only opening eyes briefly and moaning/grunting when asked questions.      Prior Functioning/Environment          Comments: Unsure of PLOF as pt unable to verbally respond.         OT Problem List: Decreased cognition;Decreased activity tolerance;Decreased knowledge of use of DME or AE;Decreased safety awareness      OT Treatment/Interventions: Self-care/ADL training;DME and/or AE instruction;Therapeutic activities;Patient/family education    OT Goals(Current goals can be found in the care plan section) Acute Rehab OT Goals Patient Stated Goal: none stated Time For Goal Achievement: 08/02/19 Potential to Achieve Goals: Good ADL Goals Pt Will Perform Grooming: with min assist;sitting Pt Will  Perform Upper Body Bathing: with mod assist Pt Will Perform Lower Body Bathing: with mod assist Pt Will Perform Upper Body Dressing: with mod assist Pt Will Perform Lower Body Dressing: with mod assist Pt Will Transfer to Toilet: with mod assist;ambulating;regular height toilet;grab bars Pt Will Perform Toileting - Clothing Manipulation and hygiene: with mod assist;sit to/from stand Pt Will Perform Tub/Shower Transfer: with mod assist;ambulating;shower seat;3 in 1;grab bars  OT Frequency: Min 2X/week   Barriers to D/C:            Co-evaluation              AM-PAC OT "6 Clicks" Daily Activity     Outcome Measure Help from another person eating meals?: Total Help from another person taking care of personal grooming?: Total Help from another person toileting, which includes using toliet, bedpan, or urinal?: Total Help from another person bathing (including washing, rinsing, drying)?: Total Help from another person to put on and taking off regular upper body clothing?: Total Help from another person to put on and taking off regular lower body clothing?: Total 6 Click Score: 6   End of Session    Activity Tolerance: Patient limited by lethargy;Patient limited by fatigue Patient left: with call bell/phone within reach;with bed alarm set  OT Visit Diagnosis: Other abnormalities of gait and mobility (R26.89)                Time: 4562-5638 OT Time Calculation (min): 17 min Charges:  OT General Charges $OT Visit: 1 Visit OT Evaluation $OT Eval Moderate Complexity: 1 Mod    Britt Bottom 07/26/2019, 2:49 PM

## 2019-07-26 NOTE — Progress Notes (Addendum)
PROGRESS NOTE    Aldan Camey  OJJ:[id number] DOB: [birth year] DOA: 07/23/2019 PCP: Patient, No Pcp Per   Brief Narrative:  48 year old with history of polysubstance abuse including heroin was initially brought to [facility] ER after being found down with a syringe nearby.  He was given Narcan with minimal improvement.  Found in severe rhabdomyolysis with acute kidney injury and hyperkalemia.  He was started on fluids including bicarb drip and transferred to [facility] for further care management.   Assessment & Plan:   Principal Problem:   Rhabdomyolysis Active Problems:   Acute kidney failure (HCC)   Acute metabolic encephalopathy   Polysubstance abuse (HCC)   Hyperkalemia   Transaminitis   HTN (hypertension)  Severe rhabdomyolysis with acute kidney injury AKI, worsening. - Although rhabdomyolysis is improving, CPK trending down.  Creatinine is trending upwards. - Bicarb improving, continue IV fluids. - Renal ultrasound-negative  Acute metabolic encephalopathy, hyperactive and delirious today. -CT of the head and neck is negative for any traumatic finding.  UDS-negative.  Tylenol and salicylate levels-negative. -Initially ammonia trended down, continue lactulose.  Repeat lactulose levels tomorrow morning. -No focal neuro deficits,  -TSH, B12-within normal limits, folate- normal - MRI brain- performed, result pending -EEG- generalized diffuse slowing but no seizure activity  ADDENDUM 2:20PM: MRI Brain + for acute b/l CVA. Will consult Neuro. Check Lipid panel, A1c. Order ASA.   Transaminitis trending down Elevated total bilirubin - AST> ALT, concern for alcoholic hepatitis.  Also LFTs trending down.  Total bilirubin climbing.  Will order right upper quadrant ultrasound  Alcohol use per history -Patient unable to tell me if he drinks alcohol daily or not.  We will place him on withdrawal protocol.  Leukocytosis, improving Urinary tract infection, present on admission  -UA ended up being positive which was collected at the time of admission, will start patient on IV Rocephin for 5 days. -Urine culture ordered.  Hyperkalemia, resolved -Remained stable  Essential hypertension -Not on any home medications.  DVT prophylaxis: Heparin Code Status: Full code Family Communication: None at bedside Disposition Plan: Patient is not medically stable yet.  Maintain hospital stay to continue to monitor him until his mentation has improved  Consultants:   None  Procedures:   None  Antimicrobials:   None   Subjective: Quite agitated overnight and this morning requiring IV Ativan. Grossly moving around but does not participate in any conversation.  Can get out of bed  Review of Systems Otherwise negative except as per HPI, including: Unable to fully obtain Objective: Vitals:   07/25/19 1428 07/26/19 0025 07/26/19 0210 07/26/19 0512  BP: (!) 169/84 (!) 167/119 (!) 149/109 (!) 154/129  Pulse: 100 77 80 89  Resp: 16  (!) 22 20  Temp: 97.6 F (36.4 C) 98.8 F (37.1 C)  98.3 F (36.8 C)  TempSrc: Oral   Oral  SpO2: 93% 98% 99% 98%  Weight:        Intake/Output Summary (Last 24 hours) at 07/26/2019 1206 Last data filed at 07/26/2019 0100 Gross per 24 hour  Intake 701.65 ml  Output 2300 ml  Net -1598.35 ml   Filed Weights   07/24/19 0624  Weight: 73 kg    Examination:  Constitutional: Overall appears comfortable but very agitated Eyes: PERRL, lids and conjunctivae normal ENMT: Mucous membranes are moist. Posterior pharynx clear of any exudate or lesions.Normal dentition.  Neck: normal, supple, no masses, no thyromegaly Respiratory: Bibasilar crackles Cardiovascular: Regular rate and rhythm, no murmurs / rubs / gallops.  No extremity edema. 2+ pedal pulses. No carotid bruits.  Abdomen: no tenderness, no masses palpated. No hepatosplenomegaly. Bowel sounds positive.  Musculoskeletal: no clubbing / cyanosis. No joint deformity upper and  lower extremities. Good ROM, no contractures. Normal muscle tone.  Skin: no rashes, lesions, ulcers. No induration Neurologic: Unable to assess but grossly moving all the extremities.  No focal neuro deficits noted. Psychiatric: Poor judgment and insight   Data Reviewed:   CBC: Recent Labs  Lab 07/23/19 1939 07/24/19 0159 07/24/19 1133 07/25/19 0356 07/26/19 0227  WBC 21.6* 20.7*  --  12.8* 11.8*  HGB 15.4 15.5  --  13.6 13.8  HCT 45.8 46.7 40.1 41.4 42.0  MCV 86.4 86.5  --  87.3 87.5  PLT 212 238  --  217 115   Basic Metabolic Panel: Recent Labs  Lab 07/23/19 1939 07/23/19 2300 07/24/19 0159 07/25/19 0356 07/26/19 0227  NA 137  --  140 141 142  K 4.5 4.0 3.9 3.9 3.3*  CL 109  --  107 96* 96*  CO2 16*  --  20* 31 27  GLUCOSE 133*  --  98 97 99  BUN 51*  --  56* 69* 72*  CREATININE 3.84*  --  4.13* 5.64* 5.79*  CALCIUM 6.8*  --  7.2* 7.7* 8.1*  MG  --   --   --  2.1 2.1   GFR: CrCl cannot be calculated (Unknown ideal weight.). Liver Function Tests: Recent Labs  Lab 07/23/19 1939 07/24/19 0159 07/25/19 0356 07/26/19 0227  AST 1,551* 1,453* 927* 710*  ALT 977* 1,017* 668* 508*  ALKPHOS 90 90 120 118  BILITOT 1.3* 1.3* 1.9* 3.5*  PROT 5.0* 5.4* 5.8* 6.7  ALBUMIN 2.2* 2.2* 2.3* 2.6*   No results for input(s): LIPASE, AMYLASE in the last 168 hours. Recent Labs  Lab 07/23/19 1947 07/25/19 0356  AMMONIA 55* 21   Coagulation Profile: No results for input(s): INR, PROTIME in the last 168 hours. Cardiac Enzymes: Recent Labs  Lab 07/23/19 1939 07/24/19 0159 07/25/19 0356 07/26/19 0227  CKTOTAL 36,151* 29,041* 27,691* 21,050*   BNP (last 3 results) No results for input(s): PROBNP in the last 8760 hours. HbA1C: No results for input(s): HGBA1C in the last 72 hours. CBG: No results for input(s): GLUCAP in the last 168 hours. Lipid Profile: No results for input(s): CHOL, HDL, LDLCALC, TRIG, CHOLHDL, LDLDIRECT in the last 72 hours. Thyroid Function  Tests: Recent Labs    07/24/19 0828  TSH 0.815   Anemia Panel: Recent Labs    07/24/19 1133  VITAMINB12 1,077*   Sepsis Labs: Recent Labs  Lab 07/23/19 1947 07/23/19 1955 07/23/19 2300 07/24/19 0159 07/25/19 0356  PROCALCITON 13.58  --   --  0.11 5.64  LATICACIDVEN  --  1.8 1.9  --   --     Recent Results (from the past 240 hour(s))  SARS Coronavirus 2 [facility] order, Performed in [place] hospital lab)     Status: None   Collection Time: 07/23/19  9:57 PM  Result Value Ref Range Status   SARS Coronavirus 2 NEGATIVE NEGATIVE Final    Comment: (NOTE) If result is NEGATIVE SARS-CoV-2 target nucleic acids are NOT DETECTED. The SARS-CoV-2 RNA is generally detectable in upper and lower  respiratory specimens during the acute phase of infection. The lowest  concentration of SARS-CoV-2 viral copies this assay can detect is 250  copies / mL. A negative result does not preclude SARS-CoV-2 infection  and should not be used  as the sole basis for treatment or other  patient management decisions.  A negative result may occur with  improper specimen collection / handling, submission of specimen other  than nasopharyngeal swab, presence of viral mutation(s) within the  areas targeted by this assay, and inadequate number of viral copies  (<250 copies / mL). A negative result must be combined with clinical  observations, patient history, and epidemiological information. If result is POSITIVE SARS-CoV-2 target nucleic acids are DETECTED. The SARS-CoV-2 RNA is generally detectable in upper and lower  respiratory specimens dur ing the acute phase of infection.  Positive  results are indicative of active infection with SARS-CoV-2.  Clinical  correlation with patient history and other diagnostic information is  necessary to determine patient infection status.  Positive results do  not rule out bacterial infection or co-infection with other viruses. If result is PRESUMPTIVE POSTIVE  SARS-CoV-2 nucleic acids MAY BE PRESENT.   A presumptive positive result was obtained on the submitted specimen  and confirmed on repeat testing.  While 2019 novel coronavirus  (SARS-CoV-2) nucleic acids may be present in the submitted sample  additional confirmatory testing may be necessary for epidemiological  and / or clinical management purposes  to differentiate between  SARS-CoV-2 and other Sarbecovirus currently known to infect humans.  If clinically indicated additional testing with an alternate test  methodology [id number]) is advised. The SARS-CoV-2 RNA is generally  detectable in upper and lower respiratory sp ecimens during the acute  phase of infection. The expected result is Negative. Fact Sheet for Patients:  StrictlyIdeas.no Fact Sheet for Healthcare Providers: BankingDealers.co.za This test is not yet approved or cleared by the [place] FDA and has been authorized for detection and/or diagnosis of SARS-CoV-2 by FDA under an Emergency Use Authorization (EUA).  This EUA will remain in effect (meaning this test can be used) for the duration of the COVID-19 declaration under Section 564(b)(1) of the Act, 21 U.S.C. section 360bbb-3(b)(1), unless the authorization is terminated or revoked sooner. Performed at [facility], [place] [address] [place], [place] [address]   Culture, blood (routine x 2)     Status: None (Preliminary result)   Collection Time: 07/23/19 10:41 PM   Specimen: BLOOD LEFT HAND  Result Value Ref Range Status   Specimen Description BLOOD LEFT HAND  Final   Special Requests   Final    AEROBIC BOTTLE ONLY Blood Culture results may not be optimal due to an inadequate volume of blood received in culture bottles   Culture   Final    NO GROWTH 2 DAYS Performed at [facility], [place] [address], [place], [place] [address]    Report Status PENDING  Incomplete  Culture, blood (routine x 2)      Status: None (Preliminary result)   Collection Time: 07/23/19 10:50 PM   Specimen: BLOOD  Result Value Ref Range Status   Specimen Description BLOOD LEFT ANTECUBITAL  Final   Special Requests   Final    AEROBIC BOTTLE ONLY Blood Culture results may not be optimal due to an inadequate volume of blood received in culture bottles   Culture   Final    NO GROWTH 2 DAYS Performed at [facility], [place] [address], [place], [place] [address]    Report Status PENDING  Incomplete  MRSA PCR Screening     Status: None   Collection Time: 07/24/19  2:10 AM   Specimen: Nasal Mucosa; Nasopharyngeal  Result Value Ref Range  Status   MRSA by PCR NEGATIVE NEGATIVE Final    Comment: Performed at [facility], [place] [address] [place], [place] [address]  Culture, Urine     Status: Abnormal   Collection Time: 07/25/19  1:39 PM   Specimen: Urine, Random  Result Value Ref Range Status   Specimen Description URINE, RANDOM  Final   Special Requests   Final    NONE Performed at [facility], [place] [address], [place], [place] [address]    Culture MULTIPLE SPECIES PRESENT, SUGGEST RECOLLECTION (A)  Final   Report Status 07/26/2019 FINAL  Final         Radiology Studies: US Renal  Result Date: 07/25/2019 CLINICAL DATA:  48 year old male with acute renal insufficiency. Polysubstance abuse with severe rhabdomyolysis. EXAM: RENAL / URINARY TRACT ULTRASOUND COMPLETE COMPARISON:  CT of the abdomen pelvis dated 04/06/2014 FINDINGS: Evaluation is limited inability of patient to cooperate with exam. Right Kidney: Renal measurements: 11.0 x 5.8 x 6.0 cm = volume: 200 mL. Normal echogenicity. No hydronephrosis or shadowing stone. Left Kidney: Renal measurements: 11 x 5.3 x 5.1 cm = volume: 152 mL. Normal echogenicity. No hydronephrosis or shadowing stone. Bladder: Appears normal for degree of bladder distention. IMPRESSION: Unremarkable renal ultrasound. Electronically Signed   By: Anner Crete M.D.    On: 07/25/2019 08:39        Scheduled Meds: . chlorhexidine gluconate (MEDLINE KIT)  15 mL Mouth Rinse BID  . folic acid  1 mg Oral Daily  . heparin  5,000 Units Subcutaneous Q8H  . lactulose  20 g Oral BID  . lactulose  300 mL Rectal BID  . mouth rinse  15 mL Mouth Rinse 10 times per day  . multivitamin with minerals  1 tablet Oral Daily  . thiamine  100 mg Oral Daily   Or  . thiamine  100 mg Intravenous Daily  . vitamin B-12  1,000 mcg Oral Daily   Continuous Infusions: . cefTRIAXone (ROCEPHIN)  IV 1 g (07/26/19 0817)  . potassium chloride 10 mEq (07/26/19 1159)     LOS: 3 days   Time spent= 35 mins    Ankit Arsenio Loader, MD [facility]  If 7PM-7AM, please contact night-coverage [email or web address] 07/26/2019, 12:06 PM

## 2019-07-26 NOTE — Consult Note (Signed)
Neurology Consultation Reason for Consult: Stroke Referring Physician: Reesa [name], a  CC: Altered mental status  History is obtained from: Chart review  HPI: Tyler [name] is a 48 y.o. male with a history of drug abuse who was found unresponsive with rhabdomyolysis and AKI.  Due to profound altered mental status, an MRI was obtained which demonstrates multifocal patchy infarcts throughout all hemispheres.  Nursing reports that he is sometimes communicative and that he cusses at him, but does not follow commands or interact verbally.  He has been difficulty keeping the bed, standing up and trying to urinate all over the place.  He was initially admitted on Monday to [facility] and transferred to [facility] on 8/17.  He had an elevated procalcitonin on arrival, but this is been trending down.  He had blood cultures drawn on arrival which were negative.  He has been started on ceftriaxone.  Per his daughter, he does have a long history of both alcohol and drug abuse.  ROS: Unable to obtain due to altered mental status.   Past Medical History:  Diagnosis Date  . Alcohol abuse   . Back pain   . Hypertension   . Kidney disease      Family history: Unable to obtain due to altered mental status   Social History:  reports that he has been smoking. He has been smoking about 1.00 pack per day. He uses smokeless tobacco. He reports current alcohol use of about 2.0 standard drinks of alcohol per week. He reports current drug use.   Exam: Current vital signs: BP (!) 181/127 (BP Location: Right Arm)   Pulse 89   Temp 97.6 F (36.4 C) (Oral)   Resp 16   Wt 73 kg   SpO2 95%   BMI 24.47 kg/m  Vital signs in last 24 hours: Temp:  [97.6 F (36.4 C)-98.8 F (37.1 C)] 97.6 F (36.4 C) (08/20 1700) Pulse Rate:  [77-102] 89 (08/20 2028) Resp:  [16-22] 16 (08/20 1700) BP: (146-194)/(90-129) 181/127 (08/20 2028) SpO2:  [95 %-100 %] 95 % (08/20 2028)   Physical Exam  Constitutional:  Appears well-developed and well-nourished.  Psych: Affect appropriate to situation Eyes: No scleral injection HENT: No OP obstrucion Head: Normocephalic.  Cardiovascular: Normal rate and regular rhythm.  Respiratory: Effort normal, non-labored breathing GI: Soft.  No distension. There is no tenderness.  Skin: WDI  Neuro: Mental Status: Patient is awake, but keeps his eyes tightly closed, he does not follow commands or answer questions.  He actively avoids engaging with the examiner. Cranial Nerves: II: He keeps his eyes tightly closed, so difficult to check blink to threat pupils are equal, round, and reactive to light.   III,IV, VI: His eyes are midline, no deviation V:VII: Blinks to eyelid stimulation bilaterally, he appears to have some left facial weakness VIII, X, XI, XII: Unable to assess secondary to patient's altered mental status.  Motor: He moves all extremities spontaneously, does not cooperate with formal testing Sensory: He responds to stimulation in all 4 extremities Deep Tendon Reflexes: 2+ and symmetric in the biceps and patellae, and ankles Cerebellar: Does not perform    I have reviewed labs in epic and the results pertinent to this consultation are: Elevated creatinine/BUN, elevated LFTs  I have reviewed the images obtained: MRI brain-multifocal punctate and patchy infarcts  Impression: 48 year old male with a history of IV drug abuse who presents with acute encephalopathy in the setting of AKI, rhabdomyolysis, multifocal infarcts, possible component of alcohol withdrawal as  well.  His blood cultures are negative, but given the pattern of infarcts, I would suspect cardioembolic disease and he will need an echocardiogram to further investigate this.  I would continue aspirin for now, but if it does turn out that this is infectious endocarditis, antithrombotics are not indicated.  The strokes are relatively small, but given the number and bilateral nature, they  could be making a significant contribution to his mental status.  It is unclear to me at this point how much of this is toxic/metabolic and how much is due to the strokes, but this will be teased out with time.  Recommendations: - HgbA1c, fasting lipid panel - Frequent neuro checks - Echocardiogram - Prophylactic therapy-Antiplatelet med: Aspirin - dose 325mg  PO or 300mg  PR - Risk factor modification - Telemetry monitoring - PT consult, OT consult, Speech consult - Stroke team to follow -Continue thiamine supplementation  [name], MD [facility] [phone]  If 7pm- 7am, please page neurology on call as listed in AMION.   [name], MD [facility] [phone]  If 7pm- 7am, please page neurology on call as listed in AMION.

## 2019-07-26 NOTE — Evaluation (Signed)
Physical Therapy Evaluation Patient Details Name: Tyler [name] MRN: [id number] DOB: 04/26/1971 Today's Date: 07/26/2019   History of Present Illness  Pt is a 48 y/o male transferred from [place] [facility] for unresponsiveness, likely secondary to substance abuse. Pt with rhabdomyolysis and AKI. EEG revealed moderate diffuse encephalopathy. PMH includes polysubstance abuse and HTN.   Clinical Impression  Pt admitted secondary to problem above with deficits below. Pt initially very lethargic at beginning of session, but then became very resistive to any attempts at mobility. Kept eyes closed throughout majority of session and would moan and grimace occasionally. Attempted multiple times to assist pt with sitting at EOB, however, pt very resistive. Was able to assist with rolling from side to side with max A +2 for clean up, as bed pads and gowns were soiled. Unsure of home environment or PLOF given current deficits, but feel pt will likely require increased assist at d/c. Will continue to follow acutely to maximize functional mobility independence and safety.     Follow Up Recommendations SNF;Supervision/Assistance - 24 hour    Equipment Recommendations  Other (comment)(TBD)    Recommendations for Other Services       Precautions / Restrictions Precautions Precautions: Fall Restrictions Weight Bearing Restrictions: No      Mobility  Bed Mobility Overal bed mobility: Needs Assistance Bed Mobility: Rolling Rolling: Max assist;+2 for physical assistance         General bed mobility comments: Attempted to sit up at EOB, however, when PT attempted to move pt's legs to EOB, pt would put legs back on bed. Attempted multiple times to sit pt up at EOB, however, pt with increased agitation. Did assist with rolling side to side for clean up, as pads and gowns soiled. RN in room to assist. Required max A +2 for rolling tasks.   Transfers                    Ambulation/Gait                 Stairs            Wheelchair Mobility    Modified Rankin (Stroke Patients Only)       Balance                                             Pertinent Vitals/Pain Pain Assessment: Faces Faces Pain Scale: Hurts little more Pain Location: generalized Pain Descriptors / Indicators: Moaning;Grimacing;Guarding Pain Intervention(s): Limited activity within patient's tolerance;Monitored during session;Repositioned    Home Living Family/patient expects to be discharged to:: Unsure                 Additional Comments: Pt lethargic during session and only moaning when asked questions.     Prior Function           Comments: Unsure of PLOF as pt unable to verbally respond.      Hand Dominance        Extremity/Trunk Assessment   Upper Extremity Assessment Upper Extremity Assessment: Defer to OT evaluation    Lower Extremity Assessment Lower Extremity Assessment: Generalized weakness;Difficult to assess due to impaired cognition       Communication   Communication: Other (comment)(lethargic; not speaking during session)  Cognition Arousal/Alertness: Lethargic Behavior During Therapy: Agitated;Restless Overall Cognitive Status: No family/caregiver present to determine baseline cognitive functioning  General Comments: Pt very lethargic during session. Would open eyes occasionally. When attempting to sit pt up at EOB, pt becoming resistive and squeezing PT hands and swatting at PT. Pt very resistive when rolling for clean up as well.       General Comments      Exercises     Assessment/Plan    PT Assessment Patient needs continued PT services  PT Problem List Decreased knowledge of precautions;Decreased cognition;Decreased knowledge of use of DME;Decreased safety awareness;Decreased mobility;Decreased balance;Decreased activity tolerance;Decreased strength       PT Treatment  Interventions DME instruction;Gait training;Functional mobility training;Therapeutic activities;Balance training;Therapeutic exercise;Patient/family education;Cognitive remediation    PT Goals (Current goals can be found in the Care Plan section)  Acute Rehab PT Goals PT Goal Formulation: Patient unable to participate in goal setting Time For Goal Achievement: 08/09/19 Potential to Achieve Goals: Fair    Frequency Min 2X/week   Barriers to discharge Other (comment) Unsure of caregiver support or home environment.     Co-evaluation               AM-PAC PT "6 Clicks" Mobility  Outcome Measure Help needed turning from your back to your side while in a flat bed without using bedrails?: Total Help needed moving from lying on your back to sitting on the side of a flat bed without using bedrails?: Total Help needed moving to and from a bed to a chair (including a wheelchair)?: Total Help needed standing up from a chair using your arms (e.g., wheelchair or bedside chair)?: Total Help needed to walk in hospital room?: Total Help needed climbing 3-5 steps with a railing? : Total 6 Click Score: 6    End of Session   Activity Tolerance: Patient limited by lethargy;Treatment limited secondary to agitation Patient left: in bed;with call bell/phone within reach;with nursing/sitter in room Nurse Communication: Mobility status PT Visit Diagnosis: Other abnormalities of gait and mobility (R26.89);Difficulty in walking, not elsewhere classified (R26.2)    Time: 2952-8413 PT Time Calculation (min) (ACUTE ONLY): 25 min   Charges:   PT Evaluation $PT Eval Moderate Complexity: 1 Mod PT Treatments $Therapeutic Activity: 8-22 mins        Leighton Ruff, PT, DPT  Acute Rehabilitation Services  Pager: [phone] Office: [phone]   Rudean Hitt 07/26/2019, 10:41 AM

## 2019-07-26 NOTE — Progress Notes (Signed)
Patient cussing out and hitting staff while trying to get out of bed. Notified Bodenheimer. Orders placed. 2333 Notified Bodenheimer patient EKG this afternoon showed prolong QTc, unable to give Haldol. Patient given bath, placed in restraints, resting comfortably in bed. Will continue to monitor and make oncall md aware of any changes. 0014 Patient daughter notified about patient becoming comabtive and patient currently in restraints.

## 2019-07-27 ENCOUNTER — Inpatient Hospital Stay (HOSPITAL_COMMUNITY): Payer: Medicaid Other

## 2019-07-27 DIAGNOSIS — F1910 Other psychoactive substance abuse, uncomplicated: Secondary | ICD-10-CM

## 2019-07-27 LAB — GLUCOSE, CAPILLARY
Glucose-Capillary: 74 mg/dL (ref 70–99)
Glucose-Capillary: 77 mg/dL (ref 70–99)
Glucose-Capillary: 116 mg/dL — ABNORMAL HIGH (ref 70–99)
Glucose-Capillary: 107 mg/dL — ABNORMAL HIGH (ref 70–99)
Glucose-Capillary: 115 mg/dL — ABNORMAL HIGH (ref 70–99)

## 2019-07-27 LAB — COMPREHENSIVE METABOLIC PANEL
ALT: 433 U/L — ABNORMAL HIGH (ref 0–44)
AST: 635 U/L — ABNORMAL HIGH (ref 15–41)
Albumin: 2.9 g/dL — ABNORMAL LOW (ref 3.5–5.0)
Alkaline Phosphatase: 100 U/L (ref 38–126)
Anion gap: 18 — ABNORMAL HIGH (ref 5–15)
BUN: 76 mg/dL — ABNORMAL HIGH (ref 6–20)
CO2: 24 mmol/L (ref 22–32)
Calcium: 8.9 mg/dL (ref 8.9–10.3)
Chloride: 103 mmol/L (ref 98–111)
Creatinine, Ser: 5.42 mg/dL — ABNORMAL HIGH (ref 0.61–1.24)
GFR calc Af Amer: 13 mL/min — ABNORMAL LOW (ref >60)
GFR calc non Af Amer: 11 mL/min — ABNORMAL LOW (ref >60)
Glucose, Bld: 85 mg/dL (ref 70–99)
Potassium: 3.7 mmol/L (ref 3.5–5.1)
Sodium: 145 mmol/L (ref 135–145)
Total Bilirubin: 3.3 mg/dL — ABNORMAL HIGH (ref 0.3–1.2)
Total Protein: 6.6 g/dL (ref 6.5–8.1)

## 2019-07-27 LAB — CBC
HCT: 42.4 % (ref 39.0–52.0)
Hemoglobin: 13.8 g/dL (ref 13.0–17.0)
MCH: 29.1 pg (ref 26.0–34.0)
MCHC: 32.5 g/dL (ref 30.0–36.0)
MCV: 89.3 fL (ref 80.0–100.0)
Platelets: 189 10*3/uL (ref 150–400)
RBC: 4.75 MIL/uL (ref 4.22–5.81)
RDW: 14.2 % (ref 11.5–15.5)
WBC: 8.8 10*3/uL (ref 4.0–10.5)
nRBC: 0 % (ref 0.0–0.2)

## 2019-07-27 LAB — LIPID PANEL
Cholesterol: 150 mg/dL (ref 0–200)
HDL: 19 mg/dL — ABNORMAL LOW (ref >40)
LDL Cholesterol: 83 mg/dL (ref 0–99)
Total CHOL/HDL Ratio: 7.9 RATIO
Triglycerides: 239 mg/dL — ABNORMAL HIGH (ref <150)
VLDL: 48 mg/dL — ABNORMAL HIGH (ref 0–40)

## 2019-07-27 LAB — HEMOGLOBIN A1C
Hgb A1c MFr Bld: 5.1 % (ref 4.8–5.6)
Mean Plasma Glucose: 99.67 mg/dL

## 2019-07-27 LAB — MAGNESIUM: Magnesium: 2.1 mg/dL (ref 1.7–2.4)

## 2019-07-27 LAB — AMMONIA: Ammonia: 42 umol/L — ABNORMAL HIGH (ref 9–35)

## 2019-07-27 LAB — CK: Total CK: 21620 U/L — ABNORMAL HIGH (ref 49–397)

## 2019-07-27 MED ORDER — HYDRALAZINE HCL 20 MG/ML IJ SOLN
10.0000 mg | Freq: Four times a day (QID) | INTRAMUSCULAR | Status: DC | PRN
  Administered 2019-07-27 – 2019-07-31 (×7): 10 mg via INTRAVENOUS
  Filled 2019-07-27 (×10): qty 1

## 2019-07-27 MED ORDER — ADULT MULTIVITAMIN LIQUID CH
15.0000 mL | Freq: Every day | ORAL | Status: DC
  Administered 2019-07-29 – 2019-08-09 (×12): 15 mL via ORAL
  Filled 2019-07-27 (×14): qty 15

## 2019-07-27 MED ORDER — DEXTROSE-NACL 5-0.45 % IV SOLN
INTRAVENOUS | Status: DC
  Administered 2019-07-27 – 2019-07-28 (×3): via INTRAVENOUS

## 2019-07-27 MED ORDER — FOLIC ACID 5 MG/ML IJ SOLN
1.0000 mg | Freq: Every day | INTRAMUSCULAR | Status: DC
  Administered 2019-07-27 – 2019-08-04 (×8): 1 mg via INTRAVENOUS
  Filled 2019-07-27 (×10): qty 0.2

## 2019-07-27 MED ORDER — HALOPERIDOL LACTATE 5 MG/ML IJ SOLN
2.5000 mg | Freq: Four times a day (QID) | INTRAMUSCULAR | Status: DC | PRN
  Filled 2019-07-27 (×3): qty 1

## 2019-07-27 MED ORDER — CYANOCOBALAMIN 1000 MCG/ML IJ SOLN
1000.0000 ug | INTRAMUSCULAR | Status: DC
  Administered 2019-07-27: 1000 ug via INTRAMUSCULAR
  Filled 2019-07-27: qty 1

## 2019-07-27 MED ORDER — HALOPERIDOL LACTATE 5 MG/ML IJ SOLN
2.5000 mg | Freq: Four times a day (QID) | INTRAMUSCULAR | Status: DC | PRN
  Administered 2019-07-27 – 2019-07-29 (×3): 2.5 mg via INTRAVENOUS
  Filled 2019-07-27: qty 1

## 2019-07-27 MED ORDER — LABETALOL HCL 5 MG/ML IV SOLN
10.0000 mg | Freq: Once | INTRAVENOUS | Status: AC
  Administered 2019-07-27: 02:00:00 10 mg via INTRAVENOUS
  Filled 2019-07-27: qty 4

## 2019-07-27 NOTE — Procedures (Signed)
Patient is not cooperative for a 20 min echo.  Wanting to sit up in bed and using profanity.

## 2019-07-27 NOTE — Progress Notes (Signed)
CSW is following for assistance with disposition. The patient is not alert and orientated. CSW is unable to assess the patient at this time.   CSW has emailed financial counseling to start a medicaid application for the patient.   CSW will continue to follow and assist.   Domenic Schwab, MSW, [place] Worker [facility]  [phone]

## 2019-07-27 NOTE — Progress Notes (Addendum)
Carotid artery duplex completed. Refer to "CV Proc" under chart review to view preliminary results.  07/27/2019 2:18 PM Maudry Mayhew, MHA, RVT, RDCS, RDMS

## 2019-07-27 NOTE — Progress Notes (Signed)
Attempted carotid artery duplex. Patient attempting to get out of bed and cussing at staff when asked to lie down. Will place order on hold- please notify us if/when patient is amendable to exam.  07/27/2019 10:08 AM Maudry Mayhew, MHA, RVT, RDCS, RDMS

## 2019-07-27 NOTE — Progress Notes (Signed)
PROGRESS NOTE    Tyler [name]  YHC:[id number] DOB: 1971-06-12 DOA: 07/23/2019 PCP: Patient, No Pcp Per   Brief Narrative:  48 year old with history of polysubstance abuse including heroin was initially brought to [facility] ER after being found down with a syringe nearby.  He was given Narcan with minimal improvement.  Found in severe rhabdomyolysis with acute kidney injury and hyperkalemia.  He was started on fluids including bicarb drip and transferred to [facility] for further care management.   Assessment & Plan:   Principal Problem:   Rhabdomyolysis Active Problems:   Acute kidney failure (HCC)   Acute metabolic encephalopathy   Polysubstance abuse (HCC)   Hyperkalemia   Transaminitis   HTN (hypertension)  Severe rhabdomyolysis with acute kidney injury AKI, worsening. -Stable, Cr Improving with fluids.  - Bicarb improving, continue IV fluids. - Renal ultrasound-negative  Acute metabolic encephalopathy, hyperactive and delirious today. Acute bilateral cerebrovascular CVA, nonhemorrhagic -CT of the head and neck is negative for any traumatic finding.  UDS-negative.  Tylenol and salicylate levels-negative. -Initially ammonia trended down, continue lactulose.  Repeat lactulose levels tomorrow morning. -No focal neuro deficits,  -TSH, B12-within normal limits, folate- normal - MRI brain- acute CVA -EEG- generalized diffuse slowing but no seizure activity Continue aspirin, LDL 83, hemoglobin A1c 5.1 -Echocardiogram-pending -Neurology following.  Transaminitis trending down Elevated total bilirubin - AST> ALT, concern for alcoholic hepatitis.  Also LFTs trending down.  Total bilirubin climbing.  Right upper quadrant ultrasound- hepatic steatosis with early cirrhosis.  Some gallbladder wall thickening  Alcohol use per history -Patient unable to tell me if he drinks alcohol daily or not.  We will place him on withdrawal protocol.  Leukocytosis, resolved Urinary tract  infection, present on admission -UA ended up being positive which was collected at the time of admission, will start patient on IV Rocephin for 5 days. -Urine culture ordered.  Hyperkalemia, resolved -Remained stable  Essential hypertension -Not on any home medications.  DVT prophylaxis: Heparin Code Status: Full code Family Communication: Daughter and brother aware Disposition Plan: Maintain hospital stay patient still quite confused and undergoing evaluation for acute CVA.  Unsafe and unstable for discharge  Consultants:   None  Procedures:   None  Antimicrobials:   None   Subjective: Patient is more hyperactive and awake this morning but still does not follow all commands.  Sometimes responds to his name.  Does not carry on any meaningful conversation  Review of Systems Otherwise negative except as per HPI, including: Unable to fully obtain Objective: Vitals:   07/27/19 0530 07/27/19 0543 07/27/19 0800 07/27/19 1213  BP: (!) 149/126 (!) 157/130 (!) 142/107 (!) 192/116  Pulse: 97  97 (!) 102  Resp:    17  Temp: 98.3 F (36.8 C)  98.5 F (36.9 C) 98.3 F (36.8 C)  TempSrc: Oral  Oral Oral  SpO2: 100%  100% 99%  Weight:        Intake/Output Summary (Last 24 hours) at 07/27/2019 1243 Last data filed at 07/27/2019 1221 Gross per 24 hour  Intake 2178 ml  Output 2300 ml  Net -122 ml   Filed Weights   07/24/19 0624  Weight: 73 kg    Examination:  Constitutional: Not cooperative, he is currently in mittens Eyes: PERRL, lids and conjunctivae normal ENMT: Mucous membranes are moist. Posterior pharynx clear of any exudate or lesions.Normal dentition.  Neck: normal, supple, no masses, no thyromegaly Respiratory: clear to auscultation bilaterally, no wheezing, no crackles. Normal respiratory effort. No accessory  muscle use.  Cardiovascular: Regular rate and rhythm, no murmurs / rubs / gallops. No extremity edema. 2+ pedal pulses. No carotid bruits.  Abdomen: no  tenderness, no masses palpated. No hepatosplenomegaly. Bowel sounds positive.  Musculoskeletal: no clubbing / cyanosis. No joint deformity upper and lower extremities. Good ROM, no contractures. Normal muscle tone.  Skin: no rashes, lesions, ulcers. No induration Neurologic: Difficult to fully assess Psychiatric: Poor judgment and insight difficult to assess    Data Reviewed:   CBC: Recent Labs  Lab 07/23/19 1939 07/24/19 0159 07/24/19 1133 07/25/19 0356 07/26/19 0227 07/27/19 0331  WBC 21.6* 20.7*  --  12.8* 11.8* 8.8  HGB 15.4 15.5  --  13.6 13.8 13.8  HCT 45.8 46.7 40.1 41.4 42.0 42.4  MCV 86.4 86.5  --  87.3 87.5 89.3  PLT 212 238  --  217 208 325   Basic Metabolic Panel: Recent Labs  Lab 07/23/19 1939 07/23/19 2300 07/24/19 0159 07/25/19 0356 07/26/19 0227 07/27/19 0331  NA 137  --  140 141 142 145  K 4.5 4.0 3.9 3.9 3.3* 3.7  CL 109  --  107 96* 96* 103  CO2 16*  --  20* '31 27 24  ' GLUCOSE 133*  --  98 97 99 85  BUN 51*  --  56* 69* 72* 76*  CREATININE 3.84*  --  4.13* 5.64* 5.79* 5.42*  CALCIUM 6.8*  --  7.2* 7.7* 8.1* 8.9  MG  --   --   --  2.1 2.1 2.1   GFR: CrCl cannot be calculated (Unknown ideal weight.). Liver Function Tests: Recent Labs  Lab 07/23/19 1939 07/24/19 0159 07/25/19 0356 07/26/19 0227 07/27/19 0331  AST 1,551* 1,453* 927* 710* 635*  ALT 977* 1,017* 668* 508* 433*  ALKPHOS 90 90 120 118 100  BILITOT 1.3* 1.3* 1.9* 3.5* 3.3*  PROT 5.0* 5.4* 5.8* 6.7 6.6  ALBUMIN 2.2* 2.2* 2.3* 2.6* 2.9*   No results for input(s): LIPASE, AMYLASE in the last 168 hours. Recent Labs  Lab 07/23/19 1947 07/25/19 0356 07/27/19 0331  AMMONIA 55* 21 42*   Coagulation Profile: No results for input(s): INR, PROTIME in the last 168 hours. Cardiac Enzymes: Recent Labs  Lab 07/23/19 1939 07/24/19 0159 07/25/19 0356 07/26/19 0227 07/27/19 0331  CKTOTAL 36,151* 29,041* 27,691* 21,050* 21,620*   BNP (last 3 results) No results for input(s):  PROBNP in the last 8760 hours. HbA1C: Recent Labs    07/27/19 0331  HGBA1C 5.1   CBG: Recent Labs  Lab 07/26/19 1456 07/26/19 1817 07/27/19 0208 07/27/19 0639 07/27/19 1218  GLUCAP 83 78 74 77 116*   Lipid Profile: Recent Labs    07/27/19 0331  CHOL 150  HDL 19*  LDLCALC 83  TRIG 239*  CHOLHDL 7.9   Thyroid Function Tests: No results for input(s): TSH, T4TOTAL, FREET4, T3FREE, THYROIDAB in the last 72 hours. Anemia Panel: No results for input(s): VITAMINB12, FOLATE, FERRITIN, TIBC, IRON, RETICCTPCT in the last 72 hours. Sepsis Labs: Recent Labs  Lab 07/23/19 1947 07/23/19 1955 07/23/19 2300 07/24/19 0159 07/25/19 0356  PROCALCITON 13.58  --   --  0.11 5.64  LATICACIDVEN  --  1.8 1.9  --   --     Recent Results (from the past 240 hour(s))  SARS Coronavirus 2 [facility] order, Performed in [place] hospital lab)     Status: None   Collection Time: 07/23/19  9:57 PM  Result Value Ref Range Status   SARS Coronavirus 2 NEGATIVE NEGATIVE  Final    Comment: (NOTE) If result is NEGATIVE SARS-CoV-2 target nucleic acids are NOT DETECTED. The SARS-CoV-2 RNA is generally detectable in upper and lower  respiratory specimens during the acute phase of infection. The lowest  concentration of SARS-CoV-2 viral copies this assay can detect is 250  copies / mL. A negative result does not preclude SARS-CoV-2 infection  and should not be used as the sole basis for treatment or other  patient management decisions.  A negative result may occur with  improper specimen collection / handling, submission of specimen other  than nasopharyngeal swab, presence of viral mutation(s) within the  areas targeted by this assay, and inadequate number of viral copies  (<250 copies / mL). A negative result must be combined with clinical  observations, patient history, and epidemiological information. If result is POSITIVE SARS-CoV-2 target nucleic acids are DETECTED. The SARS-CoV-2 RNA is  generally detectable in upper and lower  respiratory specimens dur ing the acute phase of infection.  Positive  results are indicative of active infection with SARS-CoV-2.  Clinical  correlation with patient history and other diagnostic information is  necessary to determine patient infection status.  Positive results do  not rule out bacterial infection or co-infection with other viruses. If result is PRESUMPTIVE POSTIVE SARS-CoV-2 nucleic acids MAY BE PRESENT.   A presumptive positive result was obtained on the submitted specimen  and confirmed on repeat testing.  While 2019 novel coronavirus  (SARS-CoV-2) nucleic acids may be present in the submitted sample  additional confirmatory testing may be necessary for epidemiological  and / or clinical management purposes  to differentiate between  SARS-CoV-2 and other Sarbecovirus currently known to infect humans.  If clinically indicated additional testing with an alternate test  methodology [phone]) is advised. The SARS-CoV-2 RNA is generally  detectable in upper and lower respiratory sp ecimens during the acute  phase of infection. The expected result is Negative. Fact Sheet for Patients:  StrictlyIdeas.no Fact Sheet for Healthcare Providers: BankingDealers.co.za This test is not yet approved or cleared by the [place] FDA and has been authorized for detection and/or diagnosis of SARS-CoV-2 by FDA under an Emergency Use Authorization (EUA).  This EUA will remain in effect (meaning this test can be used) for the duration of the COVID-19 declaration under Section 564(b)(1) of the Act, 21 U.S.C. section 360bbb-3(b)(1), unless the authorization is terminated or revoked sooner. Performed at [facility], [place] [address], [place], [place] [address]   Culture, blood (routine x 2)     Status: None (Preliminary result)   Collection Time: 07/23/19 10:41 PM   Specimen: BLOOD LEFT HAND   Result Value Ref Range Status   Specimen Description BLOOD LEFT HAND  Final   Special Requests   Final    AEROBIC BOTTLE ONLY Blood Culture results may not be optimal due to an inadequate volume of blood received in culture bottles   Culture   Final    NO GROWTH 3 DAYS Performed at [facility], [place] [address] [place], [place] [address]    Report Status PENDING  Incomplete  Culture, blood (routine x 2)     Status: None (Preliminary result)   Collection Time: 07/23/19 10:50 PM   Specimen: BLOOD  Result Value Ref Range Status   Specimen Description BLOOD LEFT ANTECUBITAL  Final   Special Requests   Final    AEROBIC BOTTLE ONLY Blood Culture results may not be optimal due to an inadequate volume of  blood received in culture bottles   Culture   Final    NO GROWTH 3 DAYS Performed at [facility], [place] [address], [place], [place] [address]    Report Status PENDING  Incomplete  MRSA PCR Screening     Status: None   Collection Time: 07/24/19  2:10 AM   Specimen: Nasal Mucosa; Nasopharyngeal  Result Value Ref Range Status   MRSA by PCR NEGATIVE NEGATIVE Final    Comment: Performed at [facility], [place] [address], [place], [place] [address]  Culture, Urine     Status: Abnormal   Collection Time: 07/25/19  1:39 PM   Specimen: Urine, Random  Result Value Ref Range Status   Specimen Description URINE, RANDOM  Final   Special Requests   Final    NONE Performed at [facility], [place] [address], [place], [place] [address]    Culture MULTIPLE SPECIES PRESENT, SUGGEST RECOLLECTION (A)  Final   Report Status 07/26/2019 FINAL  Final         Radiology Studies: Mr Brain Wo Contrast  Result Date: 07/26/2019 CLINICAL DATA:  Altered level consciousness. Found down. History of polysubstance abuse. EXAM: MRI HEAD WITHOUT CONTRAST TECHNIQUE: Multiplanar, multiecho pulse sequences of the brain and surrounding structures were obtained without intravenous contrast.  COMPARISON:  Head CT 07/23/2019 FINDINGS: Due to the patient's altered mental status and inability to follow commands, the examination was terminated prior to completion. Axial and coronal diffusion and axial FLAIR sequences were obtained and are severely motion degraded. There are multiple small foci of acute infarction in both cerebral hemispheres which involve cerebral white matter greater than cortex and basal ganglia. Slightly larger, patchy year infarcts are present in the left temporal occipital region. There are also relatively symmetrically distributed small acute infarcts in the cerebellum bilaterally. No gross intracranial hemorrhage or extra-axial fluid collection is identified. Background chronic cerebral white matter disease is incompletely evaluated. There is encephalomalacia in the right temporal lobe. IMPRESSION: 1. Incomplete, severely motion degraded examination. 2. Patchy acute bilateral cerebral and cerebellar infarcts. 3. Right temporal lobe encephalomalacia and chronic cerebral white matter disease, incompletely evaluated. Electronically Signed   By: Logan Bores M.D.   On: 07/26/2019 12:34   US Abdomen Limited Ruq  Result Date: 07/26/2019 CLINICAL DATA:  Elevated LFTs EXAM: ULTRASOUND ABDOMEN LIMITED RIGHT UPPER QUADRANT COMPARISON:  CT 09/10/2013 FINDINGS: Gallbladder: Negative for shadowing stones. Slight increased wall thickness at 3.6 mm. Common bile duct: Diameter: 3.5 mm Liver: No focal hepatic abnormality. Suspicion of mild contour nodularity of the liver. Portal vein is patent on color Doppler imaging with normal direction of blood flow towards the liver. Other: None. IMPRESSION: 1. Negative for gallstones. Slight increased wall thickness, nonspecific finding, can be seen in the setting of cholecystitis, liver disease, or edema forming states. 2. Suspicion of subtle contour nodularity of the liver, question early changes of cirrhosis. No focal hepatic abnormality. Electronically  Signed   By: Donavan Foil M.D.   On: 07/26/2019 13:59        Scheduled Meds: .  stroke: mapping our early stages of recovery book   Does not apply Once  . aspirin  300 mg Rectal Daily   Or  . aspirin  325 mg Oral Daily  . chlorhexidine gluconate (MEDLINE KIT)  15 mL Mouth Rinse BID  . folic acid  1 mg Oral Daily  . heparin  5,000 Units Subcutaneous Q8H  . lactulose  20 g Oral BID  .  mouth rinse  15 mL Mouth Rinse 10 times per day  . multivitamin with minerals  1 tablet Oral Daily  . thiamine  100 mg Oral Daily   Or  . thiamine  100 mg Intravenous Daily  . vitamin B-12  1,000 mcg Oral Daily   Continuous Infusions: . sodium chloride Stopped (07/27/19 0940)  . cefTRIAXone (ROCEPHIN)  IV 1 g (07/27/19 0813)  . dextrose 5 % and 0.45% NaCl 125 mL/hr at 07/27/19 0940     LOS: 4 days   Time spent= 35 mins    Latrice Storlie Arsenio Loader, MD [facility]  If 7PM-7AM, please contact night-coverage [email or web address] 07/27/2019, 12:43 PM

## 2019-07-27 NOTE — Progress Notes (Signed)
STROKE TEAM PROGRESS NOTE   INTERVAL HISTORY I have personally reviewed history of presenting illness, electronic medical records and imaging films in PACS.  Patient is restless in bed.  He has restraints in all 4 extremities.  He is difficult to redirect and does not follow commands.  He refused echocardiogram and diagnostic testing  Vitals:   07/27/19 0211 07/27/19 0325 07/27/19 0530 07/27/19 0543  BP: (!) 198/143 (!) 206/145 (!) 149/126 (!) 157/130  Pulse: 95 76 97   Resp: 17     Temp: 98.2 F (36.8 C)  98.3 F (36.8 C)   TempSrc: Oral  Oral   SpO2: 100% 100% 100%   Weight:        CBC:  Recent Labs  Lab 07/26/19 0227 07/27/19 0331  WBC 11.8* 8.8  HGB 13.8 13.8  HCT 42.0 42.4  MCV 87.5 89.3  PLT 208 189    Basic Metabolic Panel:  Recent Labs  Lab 07/26/19 0227 07/27/19 0331  NA 142 145  K 3.3* 3.7  CL 96* 103  CO2 27 24  GLUCOSE 99 85  BUN 72* 76*  CREATININE 5.79* 5.42*  CALCIUM 8.1* 8.9  MG 2.1 2.1   Lipid Panel:     Component Value Date/Time   CHOL 150 07/27/2019 0331   TRIG 239 (H) 07/27/2019 0331   HDL 19 (L) 07/27/2019 0331   CHOLHDL 7.9 07/27/2019 0331   VLDL 48 (H) 07/27/2019 0331   LDLCALC 83 07/27/2019 0331   HgbA1c:  Lab Results  Component Value Date   HGBA1C 5.1 07/27/2019   Urine Drug Screen:     Component Value Date/Time   LABOPIA NONE DETECTED 04/20/2012 1844   COCAINSCRNUR POSITIVE (A) 04/20/2012 1844   LABBENZ NONE DETECTED 04/20/2012 1844   AMPHETMU NONE DETECTED 04/20/2012 1844   THCU NONE DETECTED 04/20/2012 1844   LABBARB NONE DETECTED 04/20/2012 1844    Alcohol Level     Component Value Date/Time   ETH 127 (H) 04/20/2012 1839    IMAGING Mr Brain Wo Contrast  Result Date: 07/26/2019 CLINICAL DATA:  Altered level consciousness. Found down. History of polysubstance abuse. EXAM: MRI HEAD WITHOUT CONTRAST TECHNIQUE: Multiplanar, multiecho pulse sequences of the brain and surrounding structures were obtained without  intravenous contrast. COMPARISON:  Head CT 07/23/2019 FINDINGS: Due to the patient's altered mental status and inability to follow commands, the examination was terminated prior to completion. Axial and coronal diffusion and axial FLAIR sequences were obtained and are severely motion degraded. There are multiple small foci of acute infarction in both cerebral hemispheres which involve cerebral white matter greater than cortex and basal ganglia. Slightly larger, patchy year infarcts are present in the left temporal occipital region. There are also relatively symmetrically distributed small acute infarcts in the cerebellum bilaterally. No gross intracranial hemorrhage or extra-axial fluid collection is identified. Background chronic cerebral white matter disease is incompletely evaluated. There is encephalomalacia in the right temporal lobe. IMPRESSION: 1. Incomplete, severely motion degraded examination. 2. Patchy acute bilateral cerebral and cerebellar infarcts. 3. Right temporal lobe encephalomalacia and chronic cerebral white matter disease, incompletely evaluated. Electronically Signed   By: Sebastian AcheAllen  Grady M.D.   On: 07/26/2019 12:34   [place] Abdomen Limited Ruq  Result Date: 07/26/2019 CLINICAL DATA:  Elevated LFTs EXAM: ULTRASOUND ABDOMEN LIMITED RIGHT UPPER QUADRANT COMPARISON:  CT 09/10/2013 FINDINGS: Gallbladder: Negative for shadowing stones. Slight increased wall thickness at 3.6 mm. Common bile duct: Diameter: 3.5 mm Liver: No focal hepatic abnormality. Suspicion of mild contour  nodularity of the liver. Portal vein is patent on color Doppler imaging with normal direction of blood flow towards the liver. Other: None. IMPRESSION: 1. Negative for gallstones. Slight increased wall thickness, nonspecific finding, can be seen in the setting of cholecystitis, liver disease, or edema forming states. 2. Suspicion of subtle contour nodularity of the liver, question early changes of cirrhosis. No focal hepatic  abnormality. Electronically Signed   By: Jasmine PangKim  Fujinaga M.D.   On: 07/26/2019 13:59    PHYSICAL EXAM Frail malnourished looking middle-aged Caucasian male not in distress but quite restless and noncooperative. . Afebrile. Head is nontraumatic. Neck is supple without bruit.    Cardiac exam no murmur or gallop. Lungs are clear to auscultation. Distal pulses are well felt. Neurological Exam :  Patient is restless agitated and quite uncooperative detailed exam.  His eyes are closed.  He has all 4 extremities in restraints.  He does not open eyes.  He will not follow gaze.  Pupils equal reactive fundi not visualized.  He does not follow even simple midline or commands in any of his extremities.  There is no obvious facial weakness.  Tongue midline.  He is able to move all 4 extremities spontaneously against gravity and there does not appear to be focal weakness. ASSESSMENT/PLAN Tyler [name] is a 48 y.o. male with history of alcohol and drug use found unresponsive with rhabdo and AKI. MRI done for altered mental status shows multifocal patchy infarcts throughout the brain. 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 R temporal lobe encephalomalacia and chronic cerebral white matter disease   Carotid Doppler  uncooperative  2D Echo uncooperative  EEG no sz, diffuse encephalopathy  LDL 83  HgbA1c 5.1  Heparin 5000 units sq tid for VTE prophylaxis  No antithrombotic prior to admission, now on aspirin 325 mg daily.   Therapy recommendations:  SNF  Disposition:  pending   Hypertensive Emergency  BP as high as 237/138  Stable . Permissive hypertension (OK if < 220/120) but gradually normalize in 5-7 days . Long-term BP goal normotensive  Hyperlipidemia  Home meds:  No statin  LDL 83, goal < 70  No statin given  transaminitis  Other Stroke Risk Factors  Cigarette smoker, advised to stop smoking  Drug abues. UDS positive for cocaine, advised to stop using d/t stroke risk  ETOH abuse, alcohol level 127, Patient advised to limit to 2 drinks/day due to stroke risk.  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  Patient has been agitated and quite uncooperative with diagnostic testing.  Continue rectal aspirin and further recommendations after diagnostic testing if possible.  Discussed with Dr. Nelson ChimesAmin.  Greater than 50% time during this 25-minute visit was spent on counseling and coordination of care and discussion with care team.  Delia HeadyPramod Zaneta Lightcap, MD  Medical Director [facility] Pager: [phone] 07/27/2019 4:20 PM   To contact Stroke Continuity provider, please refer to [email or web address] After hours, contact General Neurology

## 2019-07-27 NOTE — Progress Notes (Signed)
   07/27/19 1559  Restraint Order  Length of Order Daily  Restraint Status CONTINUE  Assessment  Less Restrictive Interventions Attempted Yes  Justification  Clinical Justification Pulling lines;Pulling tubes  Plan to Progress Out Reality orientation;Continue safety plan  Restraint Every 2 Hour Monitoring  Airway Clear with Spontaneous Respirations Yes  Circulation / Skin Integrity No signs of injury;Redness (see note) (redness on arms from pt pulling hard against restraints)  Emotional / Mental Status Patient asleep (from haldol)  Range of Motion Performed  Food and Fluids Patient asleep  Elimination Other (Comment) (condom catheter)  Patient's rights, dignity, safety maintained Yes  Can Restraints be Less Restrictive or Discontinued? No  Non-violent Restraints  Soft Restraint Right Wrist Continued  Soft Restraint Left Wrist Continued  Soft Restraint Right Ankle Continued  Soft Restraint Left Ankle Continued  Safety Interventions  Less Restrictive Interventions Active listening;Bed alarm;Decrease stimulation (calming techniques);Disguise equipment (limit access to medical devices);Limit setting (adjust lighting in room, reduce environmental noise);Observation;Patient safety mitt;Place patient near nursing station;Provide reassurance;Re-evaluate equipment (appropriately applied);Reorient patient;Repositioning   Pt noted to have redness on R wrist. Restraint applied correctly, not too tight. Pt had previously been pulling hard with R arm. Redness charted. Will apply foam to protect skin and continue to assess.

## 2019-07-28 ENCOUNTER — Inpatient Hospital Stay (HOSPITAL_COMMUNITY): Payer: Medicaid Other

## 2019-07-28 DIAGNOSIS — I6340 Cerebral infarction due to embolism of unspecified cerebral artery: Secondary | ICD-10-CM

## 2019-07-28 HISTORY — DX: Cerebral infarction due to embolism of unspecified cerebral artery: I63.40

## 2019-07-28 LAB — CBC
HCT: 37.9 % — ABNORMAL LOW (ref 39.0–52.0)
Hemoglobin: 12.2 g/dL — ABNORMAL LOW (ref 13.0–17.0)
MCH: 28.8 pg (ref 26.0–34.0)
MCHC: 32.2 g/dL (ref 30.0–36.0)
MCV: 89.4 fL (ref 80.0–100.0)
Platelets: 171 10*3/uL (ref 150–400)
RBC: 4.24 MIL/uL (ref 4.22–5.81)
RDW: 14.5 % (ref 11.5–15.5)
WBC: 8.5 10*3/uL (ref 4.0–10.5)
nRBC: 0 % (ref 0.0–0.2)

## 2019-07-28 LAB — GLUCOSE, CAPILLARY
Glucose-Capillary: 78 mg/dL (ref 70–99)
Glucose-Capillary: 110 mg/dL — ABNORMAL HIGH (ref 70–99)
Glucose-Capillary: 104 mg/dL — ABNORMAL HIGH (ref 70–99)

## 2019-07-28 LAB — RAPID URINE DRUG SCREEN, HOSP PERFORMED
Amphetamines: POSITIVE — AB
Barbiturates: NOT DETECTED
Benzodiazepines: POSITIVE — AB
Cocaine: NOT DETECTED
Opiates: NOT DETECTED
Tetrahydrocannabinol: NOT DETECTED

## 2019-07-28 LAB — COMPREHENSIVE METABOLIC PANEL
ALT: 307 U/L — ABNORMAL HIGH (ref 0–44)
AST: 392 U/L — ABNORMAL HIGH (ref 15–41)
Albumin: 2.6 g/dL — ABNORMAL LOW (ref 3.5–5.0)
Alkaline Phosphatase: 99 U/L (ref 38–126)
Anion gap: 13 (ref 5–15)
BUN: 66 mg/dL — ABNORMAL HIGH (ref 6–20)
CO2: 26 mmol/L (ref 22–32)
Calcium: 8.5 mg/dL — ABNORMAL LOW (ref 8.9–10.3)
Chloride: 102 mmol/L (ref 98–111)
Creatinine, Ser: 4.18 mg/dL — ABNORMAL HIGH (ref 0.61–1.24)
GFR calc Af Amer: 18 mL/min — ABNORMAL LOW (ref >60)
GFR calc non Af Amer: 16 mL/min — ABNORMAL LOW (ref >60)
Glucose, Bld: 139 mg/dL — ABNORMAL HIGH (ref 70–99)
Potassium: 2.6 mmol/L — CL (ref 3.5–5.1)
Sodium: 141 mmol/L (ref 135–145)
Total Bilirubin: 4 mg/dL — ABNORMAL HIGH (ref 0.3–1.2)
Total Protein: 6.3 g/dL — ABNORMAL LOW (ref 6.5–8.1)

## 2019-07-28 LAB — MAGNESIUM: Magnesium: 1.9 mg/dL (ref 1.7–2.4)

## 2019-07-28 LAB — CK: Total CK: 7230 U/L — ABNORMAL HIGH (ref 49–397)

## 2019-07-28 MED ORDER — LACTULOSE ENEMA
300.0000 mL | Freq: Two times a day (BID) | ORAL | Status: DC
  Administered 2019-07-28 – 2019-07-31 (×2): 300 mL via RECTAL
  Filled 2019-07-28 (×80): qty 300

## 2019-07-28 MED ORDER — POTASSIUM CHLORIDE 10 MEQ/100ML IV SOLN
10.0000 meq | INTRAVENOUS | Status: AC
  Administered 2019-07-28 (×6): 10 meq via INTRAVENOUS
  Filled 2019-07-28 (×6): qty 100

## 2019-07-28 MED ORDER — DEXTROSE-NACL 5-0.9 % IV SOLN
INTRAVENOUS | Status: DC
  Administered 2019-07-28 – 2019-07-30 (×4): via INTRAVENOUS

## 2019-07-28 MED ORDER — POTASSIUM CHLORIDE CRYS ER 20 MEQ PO TBCR
40.0000 meq | EXTENDED_RELEASE_TABLET | Freq: Two times a day (BID) | ORAL | Status: DC
  Filled 2019-07-28: qty 2

## 2019-07-28 MED ORDER — LACTULOSE 10 GM/15ML PO SOLN
20.0000 g | Freq: Two times a day (BID) | ORAL | Status: DC
  Administered 2019-07-28 – 2019-11-15 (×210): 20 g via ORAL
  Filled 2019-07-28 (×242): qty 30

## 2019-07-28 NOTE — Procedures (Signed)
Echo attempted second time. Patient sitting up, moving constantly, and unable to cooperate. Unable to complete test at this time.

## 2019-07-28 NOTE — Progress Notes (Signed)
Spoke with daughter Mellissa Kohut via phone at [phone]. Informed restless, but attempting to comfort with repositioning. Informed patient able to follow some commands to shake hand and wiggle toes. Patient cannot engage in conversation,but can answer simple questions and turns towards speaker. Daughter inquired about ammonia levels since this AM, informed will redraw labs in AM around 0400-0500 and is currently receiving lactulose which is typically given for ammonia levels.   Able to take lactulose orally. Offered fluids, drank 34ml water at 2358.   Patient restless upon reassessment. 0256 patient belly tender to palpation, upon inquiry asked if in pain and where. Inquired if pain in stomach, patient stated yes and replied yes when asked if felt as if he needed to have a bowel movement. Only scant stool smear observed on bed bad.   Patient restless for majority of night, however not agitated. Assisted patient with repositioning after indicating back hurting and muscle rub cream requested. Patient also indicated stomach hurt after administering lactulose. Lights dim, soft voices, and calm presence to avoid overstimulation of patient.   Paged NP Bodenheimer at [phone]. Informed patient increasingly restless, fidgeting; not agitated.PRN Haldol given at 0401 with no change in restlessness. One time Ativan given. Patient constantly moving from side to side, moving feet and legs.   Informed day RN of raised left clavicle, however, not tender to palpation.

## 2019-07-28 NOTE — Plan of Care (Signed)
  Problem: Education: Goal: Knowledge of disease or condition will improve Outcome: Progressing Goal: Knowledge of secondary prevention will improve Outcome: Progressing Goal: Knowledge of patient specific risk factors addressed and post discharge goals established will improve Outcome: Progressing Goal: Individualized Educational Video(s) Outcome: Progressing   

## 2019-07-28 NOTE — Progress Notes (Signed)
PROGRESS NOTE    Tyler [name]  LTJ:[id number] DOB: Oct 10, 1971 DOA: 07/23/2019 PCP: Patient, No Pcp Per   Brief Narrative:  48 year old with history of polysubstance abuse including heroin was initially brought to [facility] ER after being found down with a syringe nearby.  He was given Narcan with minimal improvement.  Found in severe rhabdomyolysis with acute kidney injury and hyperkalemia.  He was started on fluids including bicarb drip and transferred to [facility] for further care management.  Rhabdomyolysis improved but he had total investigation for metabolic encephalopathy and was found to have acute bilateral cerebellar infarct.   Assessment & Plan:   Principal Problem:   Rhabdomyolysis Active Problems:   Acute kidney failure (HCC)   Acute metabolic encephalopathy   Polysubstance abuse (HCC)   Hyperkalemia   Transaminitis   HTN (hypertension)   Cerebral embolism with cerebral infarction  Metabolic encephalopathy is multifactorial - Secondary to his drug use, uremia, heavy alcohol use, drug use, urinary tract infection.  Each issues addressed below.  Acute metabolic encephalopathy, hyperactive and delirious today. Acute bilateral cerebrovascular CVA, nonhemorrhagic -CT of the head and neck is negative for any traumatic finding.  UDS-negative.  Tylenol and salicylate levels-negative. -Ammonia still slightly high, lactulose twice daily p.o. or rectally ordered. -No focal neuro deficits,  -TSH, B12-within normal limits, folate- normal - MRI brain- acute CVA -EEG- generalized diffuse slowing but no seizure activity Continue aspirin, LDL 83, hemoglobin A1c 5.1 -Echocardiogram-pending -Neurology following.  Severe rhabdomyolysis with acute kidney injury AKI, worsening. -CK level appears to be improving. - Bicarb improving, continue IV fluids. - Renal ultrasound-negative  Hypokalemia -Aggressive repletion  Transaminitis trending down Elevated total bilirubin - AST>  ALT, concern for alcoholic hepatitis.  Also LFTs trending down.  Total bilirubin climbing.  Right upper quadrant ultrasound- hepatic steatosis with early cirrhosis.  Some gallbladder wall thickening  Alcohol use per history -Patient unable to tell me if he drinks alcohol daily or not.  We will place him on withdrawal protocol.  Leukocytosis, resolved Urinary tract infection, present on admission -Complete 5-day course of Rocephin -Urine culture -multiple organisms, suggestion recollection  Essential hypertension -Not on any home medications.  DVT prophylaxis: Heparin Code Status: Full code Family Communication: Daughter and brother aware Disposition Plan: Maintain hospital stay until his mentation is improved.  Currently he is not at his baseline at all.  He is extremely confused and agitated  Consultants:   None  Procedures:   None  Antimicrobials:   None   Subjective: Patient is sleeping in his bed but when woken up, he is extremely confused and agitated.  Review of Systems Otherwise negative except as per HPI, including: Unable to fully obtain Objective: Vitals:   07/28/19 0019 07/28/19 0337 07/28/19 0503 07/28/19 0756  BP: (!) 155/106 (!) 182/96 (!) 143/96 (!) 149/109  Pulse: 72 (!) 102 71 64  Resp: '20 18  18  ' Temp: 98.6 F (37 C) 97.9 F (36.6 C)  98.1 F (36.7 C)  TempSrc: Oral Oral  Axillary  SpO2: 99% 100%  100%  Weight:        Intake/Output Summary (Last 24 hours) at 07/28/2019 1049 Last data filed at 07/28/2019 0851 Gross per 24 hour  Intake 3481.1 ml  Output 3000 ml  Net 481.1 ml   Filed Weights   07/24/19 0624  Weight: 73 kg    Examination:  Constitutional: Not cooperative at all, very agitated.  Mittens in place Eyes: PERRL, lids and conjunctivae normal ENMT: Mucous membranes are  moist. Posterior pharynx clear of any exudate or lesions.poor dentition Neck: normal, supple, no masses, no thyromegaly Respiratory: Diminished sounds at the  bases Cardiovascular: Normal sinus rhythm Abdomen: no tenderness, no masses palpated. No hepatosplenomegaly. Bowel sounds positive.  Musculoskeletal: no clubbing / cyanosis. No joint deformity upper and lower extremities. Good ROM, no contractures. Normal muscle tone.  Skin: no rashes, lesions, ulcers. No induration Neurologic: Difficult to assess but he is grossly moving all the extremities  Psychiatric: Poor judgment and insight.    Data Reviewed:   CBC: Recent Labs  Lab 07/24/19 0159 07/24/19 1133 07/25/19 0356 07/26/19 0227 07/27/19 0331 07/28/19 0246  WBC 20.7*  --  12.8* 11.8* 8.8 8.5  HGB 15.5  --  13.6 13.8 13.8 12.2*  HCT 46.7 40.1 41.4 42.0 42.4 37.9*  MCV 86.5  --  87.3 87.5 89.3 89.4  PLT 238  --  217 208 189 287   Basic Metabolic Panel: Recent Labs  Lab 07/24/19 0159 07/25/19 0356 07/26/19 0227 07/27/19 0331 07/28/19 0246  NA 140 141 142 145 141  K 3.9 3.9 3.3* 3.7 2.6*  CL 107 96* 96* 103 102  CO2 20* '31 27 24 26  ' GLUCOSE 98 97 99 85 139*  BUN 56* 69* 72* 76* 66*  CREATININE 4.13* 5.64* 5.79* 5.42* 4.18*  CALCIUM 7.2* 7.7* 8.1* 8.9 8.5*  MG  --  2.1 2.1 2.1 1.9   GFR: CrCl cannot be calculated (Unknown ideal weight.). Liver Function Tests: Recent Labs  Lab 07/24/19 0159 07/25/19 0356 07/26/19 0227 07/27/19 0331 07/28/19 0246  AST 1,453* 927* 710* 635* 392*  ALT 1,017* 668* 508* 433* 307*  ALKPHOS 90 120 118 100 99  BILITOT 1.3* 1.9* 3.5* 3.3* 4.0*  PROT 5.4* 5.8* 6.7 6.6 6.3*  ALBUMIN 2.2* 2.3* 2.6* 2.9* 2.6*   No results for input(s): LIPASE, AMYLASE in the last 168 hours. Recent Labs  Lab 07/23/19 1947 07/25/19 0356 07/27/19 0331  AMMONIA 55* 21 42*   Coagulation Profile: No results for input(s): INR, PROTIME in the last 168 hours. Cardiac Enzymes: Recent Labs  Lab 07/24/19 0159 07/25/19 0356 07/26/19 0227 07/27/19 0331 07/28/19 0246  CKTOTAL 29,041* 27,691* 21,050* 21,620* 7,230*   BNP (last 3 results) No results for  input(s): PROBNP in the last 8760 hours. HbA1C: Recent Labs    07/27/19 0331  HGBA1C 5.1   CBG: Recent Labs  Lab 07/27/19 0639 07/27/19 1218 07/27/19 1706 07/27/19 2318 07/28/19 0647  GLUCAP 77 116* 107* 115* 78   Lipid Profile: Recent Labs    07/27/19 0331  CHOL 150  HDL 19*  LDLCALC 83  TRIG 239*  CHOLHDL 7.9   Thyroid Function Tests: No results for input(s): TSH, T4TOTAL, FREET4, T3FREE, THYROIDAB in the last 72 hours. Anemia Panel: No results for input(s): VITAMINB12, FOLATE, FERRITIN, TIBC, IRON, RETICCTPCT in the last 72 hours. Sepsis Labs: Recent Labs  Lab 07/23/19 1947 07/23/19 1955 07/23/19 2300 07/24/19 0159 07/25/19 0356  PROCALCITON 13.58  --   --  0.11 5.64  LATICACIDVEN  --  1.8 1.9  --   --     Recent Results (from the past 240 hour(s))  SARS Coronavirus 2 [facility] order, Performed in [place] hospital lab)     Status: None   Collection Time: 07/23/19  9:57 PM  Result Value Ref Range Status   SARS Coronavirus 2 NEGATIVE NEGATIVE Final    Comment: (NOTE) If result is NEGATIVE SARS-CoV-2 target nucleic acids are NOT DETECTED. The SARS-CoV-2 RNA is  generally detectable in upper and lower  respiratory specimens during the acute phase of infection. The lowest  concentration of SARS-CoV-2 viral copies this assay can detect is 250  copies / mL. A negative result does not preclude SARS-CoV-2 infection  and should not be used as the sole basis for treatment or other  patient management decisions.  A negative result may occur with  improper specimen collection / handling, submission of specimen other  than nasopharyngeal swab, presence of viral mutation(s) within the  areas targeted by this assay, and inadequate number of viral copies  (<250 copies / mL). A negative result must be combined with clinical  observations, patient history, and epidemiological information. If result is POSITIVE SARS-CoV-2 target nucleic acids are DETECTED. The  SARS-CoV-2 RNA is generally detectable in upper and lower  respiratory specimens dur ing the acute phase of infection.  Positive  results are indicative of active infection with SARS-CoV-2.  Clinical  correlation with patient history and other diagnostic information is  necessary to determine patient infection status.  Positive results do  not rule out bacterial infection or co-infection with other viruses. If result is PRESUMPTIVE POSTIVE SARS-CoV-2 nucleic acids MAY BE PRESENT.   A presumptive positive result was obtained on the submitted specimen  and confirmed on repeat testing.  While 2019 novel coronavirus  (SARS-CoV-2) nucleic acids may be present in the submitted sample  additional confirmatory testing may be necessary for epidemiological  and / or clinical management purposes  to differentiate between  SARS-CoV-2 and other Sarbecovirus currently known to infect humans.  If clinically indicated additional testing with an alternate test  methodology [phone]) is advised. The SARS-CoV-2 RNA is generally  detectable in upper and lower respiratory sp ecimens during the acute  phase of infection. The expected result is Negative. Fact Sheet for Patients:  StrictlyIdeas.no Fact Sheet for Healthcare Providers: BankingDealers.co.za This test is not yet approved or cleared by the [place] FDA and has been authorized for detection and/or diagnosis of SARS-CoV-2 by FDA under an Emergency Use Authorization (EUA).  This EUA will remain in effect (meaning this test can be used) for the duration of the COVID-19 declaration under Section 564(b)(1) of the Act, 21 U.S.C. section 360bbb-3(b)(1), unless the authorization is terminated or revoked sooner. Performed at [facility], [place] [address], [place], [place] [address]   Culture, blood (routine x 2)     Status: None (Preliminary result)   Collection Time: 07/23/19 10:41 PM   Specimen:  BLOOD LEFT HAND  Result Value Ref Range Status   Specimen Description BLOOD LEFT HAND  Final   Special Requests   Final    AEROBIC BOTTLE ONLY Blood Culture results may not be optimal due to an inadequate volume of blood received in culture bottles   Culture   Final    NO GROWTH 4 DAYS Performed at [facility], [place] [address] [place], [place] [address]    Report Status PENDING  Incomplete  Culture, blood (routine x 2)     Status: None (Preliminary result)   Collection Time: 07/23/19 10:50 PM   Specimen: BLOOD  Result Value Ref Range Status   Specimen Description BLOOD LEFT ANTECUBITAL  Final   Special Requests   Final    AEROBIC BOTTLE ONLY Blood Culture results may not be optimal due to an inadequate volume of blood received in culture bottles   Culture   Final    NO GROWTH 4 DAYS Performed at [facility]  [facility], [place] [address], [place], [place] [address]    Report Status PENDING  Incomplete  MRSA PCR Screening     Status: None   Collection Time: 07/24/19  2:10 AM   Specimen: Nasal Mucosa; Nasopharyngeal  Result Value Ref Range Status   MRSA by PCR NEGATIVE NEGATIVE Final    Comment: Performed at [facility], [place] [address] [place], [place] [address]  Culture, Urine     Status: Abnormal   Collection Time: 07/25/19  1:39 PM   Specimen: Urine, Random  Result Value Ref Range Status   Specimen Description URINE, RANDOM  Final   Special Requests   Final    NONE Performed at [facility], [place] [address] [place], [place] [address]    Culture MULTIPLE SPECIES PRESENT, SUGGEST RECOLLECTION (A)  Final   Report Status 07/26/2019 FINAL  Final         Radiology Studies: Vas US Carotid (at [place] Only)  Result Date: 07/27/2019 Carotid Arterial Duplex Study Indications: CVA. Limitations  Today's exam was limited due to the patient's inability or              unwillingness to cooperate. Performing Technologist: Maudry Mayhew RDMS, RVT, RDCS  Examination  Guidelines: A complete evaluation includes B-mode imaging, spectral Doppler, color Doppler, and power Doppler as needed of all accessible portions of each vessel. Bilateral testing is considered an integral part of a complete examination. Limited examinations for reoccurring indications may be performed as noted.  Right Carotid Findings: +----------+--------+-------+--------+----------------------+------------------+           PSV cm/sEDV    StenosisPlaque Description    Comments                             cm/s                                                    +----------+--------+-------+--------+----------------------+------------------+ CCA Prox  84      12                                   intimal thickening +----------+--------+-------+--------+----------------------+------------------+ CCA Distal64      14             smooth and                                                                heterogenous                             +----------+--------+-------+--------+----------------------+------------------+ ICA Prox  63      25             smooth and  heterogenous                             +----------+--------+-------+--------+----------------------+------------------+ ICA Distal85      19                                                      +----------+--------+-------+--------+----------------------+------------------+ ECA       72      17                                                      +----------+--------+-------+--------+----------------------+------------------+ +----------+--------+-------+----------------+-------------------+           PSV cm/sEDV cmsDescribe        Arm Pressure (mmHG) +----------+--------+-------+----------------+-------------------+ [id number]             Multiphasic, WNL                     +----------+--------+-------+----------------+-------------------+ +---------+--------+--+--------+--+---------+ VertebralPSV cm/s33EDV cm/s10Antegrade +---------+--------+--+--------+--+---------+   Left Carotid Findings: +----------+--------+--------+--------+-----------------------+--------------+           PSV cm/sEDV cm/sStenosisPlaque Description     Comments       +----------+--------+--------+--------+-----------------------+--------------+ CCA Prox  108     22              smooth and heterogenous               +----------+--------+--------+--------+-----------------------+--------------+ CCA Distal84      17                                                    +----------+--------+--------+--------+-----------------------+--------------+ ICA Prox  69      22                                                    +----------+--------+--------+--------+-----------------------+--------------+ ICA Distal81      30                                                    +----------+--------+--------+--------+-----------------------+--------------+ ECA                                                      Not visualized +----------+--------+--------+--------+-----------------------+--------------+ +----------+--------+--------+----------------+-------------------+ SubclavianPSV cm/sEDV cm/sDescribe        Arm Pressure (mmHG) +----------+--------+--------+----------------+-------------------+           91              Multiphasic, WNL                    +----------+--------+--------+----------------+-------------------+ +---------+--------+--+--------+--+---------+ VertebralPSV  cm/s51EDV cm/s14Antegrade +---------+--------+--+--------+--+---------+   Summary: Right Carotid: Velocities in the right ICA are consistent with a 1-39% stenosis. Left Carotid: Velocities in the left ICA are consistent with a 1-39% stenosis. Vertebrals:  Bilateral vertebral arteries  demonstrate antegrade flow. Subclavians: Normal flow hemodynamics were seen in bilateral subclavian              arteries. *See table(s) above for measurements and observations.  Electronically signed by Antony Contras MD on 07/27/2019 at 3:29:53 PM.    Final    US Abdomen Limited Ruq  Result Date: 07/26/2019 CLINICAL DATA:  Elevated LFTs EXAM: ULTRASOUND ABDOMEN LIMITED RIGHT UPPER QUADRANT COMPARISON:  CT 09/10/2013 FINDINGS: Gallbladder: Negative for shadowing stones. Slight increased wall thickness at 3.6 mm. Common bile duct: Diameter: 3.5 mm Liver: No focal hepatic abnormality. Suspicion of mild contour nodularity of the liver. Portal vein is patent on color Doppler imaging with normal direction of blood flow towards the liver. Other: None. IMPRESSION: 1. Negative for gallstones. Slight increased wall thickness, nonspecific finding, can be seen in the setting of cholecystitis, liver disease, or edema forming states. 2. Suspicion of subtle contour nodularity of the liver, question early changes of cirrhosis. No focal hepatic abnormality. Electronically Signed   By: Donavan Foil M.D.   On: 07/26/2019 13:59        Scheduled Meds: .  stroke: mapping our early stages of recovery book   Does not apply Once  . aspirin  300 mg Rectal Daily   Or  . aspirin  325 mg Oral Daily  . chlorhexidine gluconate (MEDLINE KIT)  15 mL Mouth Rinse BID  . cyanocobalamin  1,000 mcg Intramuscular Q30 days  . folic acid  1 mg Intravenous Daily  . heparin  5,000 Units Subcutaneous Q8H  . lactulose  20 g Oral BID   Or  . lactulose  300 mL Rectal BID  . mouth rinse  15 mL Mouth Rinse 10 times per day  . multivitamin  15 mL Oral Daily  . thiamine  100 mg Oral Daily   Or  . thiamine  100 mg Intravenous Daily   Continuous Infusions: . cefTRIAXone (ROCEPHIN)  IV 1 g (07/28/19 0937)  . dextrose 5 % and 0.9% NaCl    . potassium chloride 10 mEq (07/28/19 0953)     LOS: 5 days   Time spent= 35 mins    Annmargaret Decaprio  Arsenio Loader, MD [facility]  If 7PM-7AM, please contact night-coverage [email or web address] 07/28/2019, 10:49 AM

## 2019-07-28 NOTE — Progress Notes (Addendum)
STROKE TEAM PROGRESS NOTE   INTERVAL HISTORY Pt still on 4-point restrain. Still has sitter at bedside. Pt initially sleeping on rounds. But able to briefly open eyes on voice, then became agitated trying to sit up. Not following commands, move all extremities equally. More attending to left side.   Vitals:   07/27/19 2231 07/28/19 0019 07/28/19 0337 07/28/19 0503  BP: (!) 179/110 (!) 155/106 (!) 182/96 (!) 143/96  Pulse: (!) 106 72 (!) 102 71  Resp:  20 18   Temp:  98.6 F (37 C) 97.9 F (36.6 C)   TempSrc:  Oral Oral   SpO2:  99% 100%   Weight:        CBC:  Recent Labs  Lab 07/27/19 0331 07/28/19 0246  WBC 8.8 8.5  HGB 13.8 12.2*  HCT 42.4 37.9*  MCV 89.3 89.4  PLT 189 171    Basic Metabolic Panel:  Recent Labs  Lab 07/27/19 0331 07/28/19 0246  NA 145 141  K 3.7 2.6*  CL 103 102  CO2 24 26  GLUCOSE 85 139*  BUN 76* 66*  CREATININE 5.42* 4.18*  CALCIUM 8.9 8.5*  MG 2.1 1.9   Lipid Panel:     Component Value Date/Time   CHOL 150 07/27/2019 0331   TRIG 239 (H) 07/27/2019 0331   HDL 19 (L) 07/27/2019 0331   CHOLHDL 7.9 07/27/2019 0331   VLDL 48 (H) 07/27/2019 0331   LDLCALC 83 07/27/2019 0331   HgbA1c:  Lab Results  Component Value Date   HGBA1C 5.1 07/27/2019   Urine Drug Screen:     Component Value Date/Time   LABOPIA NONE DETECTED 04/20/2012 1844   COCAINSCRNUR POSITIVE (A) 04/20/2012 1844   LABBENZ NONE DETECTED 04/20/2012 1844   AMPHETMU NONE DETECTED 04/20/2012 1844   THCU NONE DETECTED 04/20/2012 1844   LABBARB NONE DETECTED 04/20/2012 1844    Alcohol Level     Component Value Date/Time   ETH 127 (H) 04/20/2012 1839    [facility]ING [facility] [place] [facility]  Result Date: 07/27/2019 Carotid Arterial Duplex Study Indications: CVA. Limitations  Today's exam was limited due to the patient's inability or              unwillingness to cooperate. Performing Technologist: Gertie FeyMichelle Simonetti RDMS, RVT, RDCS  Examination Guidelines: A  complete evaluation includes B-mode imaging, spectral Doppler, color Doppler, and power Doppler as needed of all accessible portions of each vessel. Bilateral testing is considered an integral part of a complete examination. Limited examinations for reoccurring indications may be performed as noted.  Right Carotid Findings: +----------+--------+-------+--------+----------------------+------------------+           PSV cm/sEDV    StenosisPlaque Description    Comments                             cm/s                                                    +----------+--------+-------+--------+----------------------+------------------+ CCA Prox  84      12                                   intimal thickening +----------+--------+-------+--------+----------------------+------------------+ CCA Distal64  14             smooth and                                                                heterogenous                             +----------+--------+-------+--------+----------------------+------------------+ ICA Prox  63      25             smooth and                                                                heterogenous                             +----------+--------+-------+--------+----------------------+------------------+ ICA Distal85      19                                                      +----------+--------+-------+--------+----------------------+------------------+ ECA       72      17                                                      +----------+--------+-------+--------+----------------------+------------------+ +----------+--------+-------+----------------+-------------------+           PSV cm/sEDV cmsDescribe        Arm Pressure (mmHG) +----------+--------+-------+----------------+-------------------+ [id number]             Multiphasic, WNL                     +----------+--------+-------+----------------+-------------------+ +---------+--------+--+--------+--+---------+ VertebralPSV cm/s33EDV cm/s10Antegrade +---------+--------+--+--------+--+---------+   Left Carotid Findings: +----------+--------+--------+--------+-----------------------+--------------+           PSV cm/sEDV cm/sStenosisPlaque Description     Comments       +----------+--------+--------+--------+-----------------------+--------------+ CCA Prox  108     22              smooth and heterogenous               +----------+--------+--------+--------+-----------------------+--------------+ CCA Distal84      17                                                    +----------+--------+--------+--------+-----------------------+--------------+ ICA Prox  69      22                                                    +----------+--------+--------+--------+-----------------------+--------------+  ICA Distal81      30                                                    +----------+--------+--------+--------+-----------------------+--------------+ ECA                                                      Not visualized +----------+--------+--------+--------+-----------------------+--------------+ +----------+--------+--------+----------------+-------------------+ SubclavianPSV cm/sEDV cm/sDescribe        Arm Pressure (mmHG) +----------+--------+--------+----------------+-------------------+           91              Multiphasic, WNL                    +----------+--------+--------+----------------+-------------------+ +---------+--------+--+--------+--+---------+ VertebralPSV cm/s51EDV cm/s14Antegrade +---------+--------+--+--------+--+---------+   Summary: Right Carotid: Velocities in the right ICA are consistent with a 1-39% stenosis. Left Carotid: Velocities in the left ICA are consistent with a 1-39% stenosis. Vertebrals:  Bilateral vertebral arteries  demonstrate antegrade flow. Subclavians: Normal flow hemodynamics were seen in bilateral subclavian              arteries. *See table(s) above for measurements and observations.  Electronically signed by Delia HeadyPramod Sethi MD on 07/27/2019 at 3:29:53 PM.    Final    [place] Abdomen Limited Ruq  Result Date: 07/26/2019 CLINICAL DATA:  Elevated LFTs EXAM: ULTRASOUND ABDOMEN LIMITED RIGHT UPPER QUADRANT COMPARISON:  CT 09/10/2013 FINDINGS: Gallbladder: Negative for shadowing stones. Slight increased wall thickness at 3.6 mm. Common bile duct: Diameter: 3.5 mm Liver: No focal hepatic abnormality. Suspicion of mild contour nodularity of the liver. Portal vein is patent on color Doppler imaging with normal direction of blood flow towards the liver. Other: None. IMPRESSION: 1. Negative for gallstones. Slight increased wall thickness, nonspecific finding, can be seen in the setting of cholecystitis, liver disease, or edema forming states. 2. Suspicion of subtle contour nodularity of the liver, question early changes of cirrhosis. No focal hepatic abnormality. Electronically Signed   By: Jasmine PangKim  Fujinaga M.D.   On: 07/26/2019 13:59    PHYSICAL EXAM Frail malnourished looking middle-aged Caucasian male not in distress but agitated with stimulation. Afebrile. Head is nontraumatic. Neck is supple without bruit.  Cardiac exam no murmur or gallop. Lungs are clear to auscultation. Distal pulses are well felt.  Neurological Exam :  Patient is sleeping initially but agitated trying to sit up with voice stimulation, not cooperative on exam.  His eyes are closed but briefly open with voice. When trying to sit up, he attends more to the left. Pupil equal but against forced opening, not able to check pupillary reflex.  He still has all 4 extremities in restraints.He does not follow even simple midline or commands in any of his extremities.  There is no obvious facial weakness.  Tongue midline.  He is able to move all 4 extremities  spontaneously against gravity and there does not appear to be focal weakness. Sensation, coordination and gait not tested.   ASSESSMENT/PLAN Mr. Tyler AppleSteven [name] is a 48 y.o. male with history of alcohol and drug use found unresponsive with rhabdo and AKI. MRI done for altered mental status shows multifocal patchy infarcts throughout the brain. 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 However, cardioembolic source cannot rule out such as afib or endocarditis   Resultant - pt still agitation on restrain, not following commands  MRI  Patchy acute B cerebral and cerebellar infarcts. R temporal lobe encephalomalacia and chronic cerebral white matter disease   Consider MRA head once stable and cooperative  Carotid Doppler unremarkable  2D Echo pending  EEG no sz, diffuse encephalopathy  LDL 83  HgbA1c 5.1  UDS pending  Blood culture pending  Heparin 5000 units sq tid for VTE prophylaxis  No antithrombotic prior to admission, now on aspirin 325 mg daily or ASA 300 PR.   Therapy recommendations:  SNF  Disposition:  pending   Metabolic encephalopathy  Likely due to rhabodo, AKI, elevated CK, LFTs and ammonia as well as alcohol withdraw  Treatment per primary team  EEG no seizure  Continue current manatement  Rhabdomyolysis AKI Elevated AST/ALT  Found down at home   Cre 5.79->5.42->4.18  Continue IVF  CK and AST/ALT improving   Continue monitoring   Elevated ammonia  On lactulose  Ammonia 55->21->42  Continue monitoring  Alcohol abuse  On CIWA protocol  On B1/FA/MVI  Seizure precautions  Hypertensive Emergency  BP as high as 237/138  Stable . Permissive hypertension (OK if < 220/120) but gradually normalize in 5-7 days . Long-term BP goal normotensive  Hyperlipidemia  Home meds:  No  statin  LDL 83, goal < 70  No statin for now given transaminitis  UTI  UA WBC  50  Leukocytosis resolved  Blood culture pending  On rocephine  Dysphagia   Decrease po intake  Not taking much po  Continue IVF but change D51/2NS to D5NS given stroke  Tobacco abuse  Current smoker  Smoking cessation counseling will be provided  Other Stroke Risk Factors  Hx of drug abues. UDS positive for cocaine in 2013  Other Active Problems  leukocytopsis WBC 12.8->11.8->8.5  - blood culture pending  Hypokalemia - K 2.6- supplement  Hospital day # 5  I spent  35 minutes in total face-to-face time with the patient, more than 50% of which was spent in counseling and coordination of care, reviewing test results, images and medication, and discussing the diagnosis of embolic stroke, metabolic encephalopathy, rabdo, AKI, elevated LFT, Ammonia, CK, UTI, dysphagia, HTN emergency, treatment plan and potential prognosis. This patient's care requiresreview of multiple databases, neurological assessment, discussion with family, other specialists and medical decision making of high complexity.   Rosalin Hawking, MD PhD Stroke Neurology 07/28/2019 10:33 AM   To contact Stroke Continuity provider, please refer to [email or web address] After hours, contact General Neurology

## 2019-07-28 NOTE — Progress Notes (Signed)
CRITICAL VALUE ALERT  Critical Value:  Potassium 2.6   Date & Time Notied:  07/28/2019 943  Provider Notified: Silas [place], NP   Orders Received/Actions taken: Awaiting callback or orders to be placed.

## 2019-07-29 ENCOUNTER — Inpatient Hospital Stay: Payer: Self-pay

## 2019-07-29 ENCOUNTER — Inpatient Hospital Stay (HOSPITAL_COMMUNITY): Payer: Medicaid Other

## 2019-07-29 DIAGNOSIS — I639 Cerebral infarction, unspecified: Secondary | ICD-10-CM

## 2019-07-29 LAB — CBC
HCT: 37.6 % — ABNORMAL LOW (ref 39.0–52.0)
Hemoglobin: 12.2 g/dL — ABNORMAL LOW (ref 13.0–17.0)
MCH: 28.8 pg (ref 26.0–34.0)
MCHC: 32.4 g/dL (ref 30.0–36.0)
MCV: 88.7 fL (ref 80.0–100.0)
Platelets: 148 10*3/uL — ABNORMAL LOW (ref 150–400)
RBC: 4.24 MIL/uL (ref 4.22–5.81)
RDW: 14.3 % (ref 11.5–15.5)
WBC: 9.5 10*3/uL (ref 4.0–10.5)
nRBC: 0 % (ref 0.0–0.2)

## 2019-07-29 LAB — COMPREHENSIVE METABOLIC PANEL
ALT: 268 U/L — ABNORMAL HIGH (ref 0–44)
AST: 299 U/L — ABNORMAL HIGH (ref 15–41)
Albumin: 2.9 g/dL — ABNORMAL LOW (ref 3.5–5.0)
Alkaline Phosphatase: 95 U/L (ref 38–126)
Anion gap: 11 (ref 5–15)
BUN: 48 mg/dL — ABNORMAL HIGH (ref 6–20)
CO2: 24 mmol/L (ref 22–32)
Calcium: 8.7 mg/dL — ABNORMAL LOW (ref 8.9–10.3)
Chloride: 107 mmol/L (ref 98–111)
Creatinine, Ser: 3.3 mg/dL — ABNORMAL HIGH (ref 0.61–1.24)
GFR calc Af Amer: 24 mL/min — ABNORMAL LOW (ref >60)
GFR calc non Af Amer: 21 mL/min — ABNORMAL LOW (ref >60)
Glucose, Bld: 123 mg/dL — ABNORMAL HIGH (ref 70–99)
Potassium: 3.2 mmol/L — ABNORMAL LOW (ref 3.5–5.1)
Sodium: 142 mmol/L (ref 135–145)
Total Bilirubin: 4.6 mg/dL — ABNORMAL HIGH (ref 0.3–1.2)
Total Protein: 6.6 g/dL (ref 6.5–8.1)

## 2019-07-29 LAB — CULTURE, BLOOD (ROUTINE X 2)
Culture: NO GROWTH
Culture: NO GROWTH

## 2019-07-29 LAB — MAGNESIUM: Magnesium: 1.5 mg/dL — ABNORMAL LOW (ref 1.7–2.4)

## 2019-07-29 LAB — GLUCOSE, CAPILLARY
Glucose-Capillary: 107 mg/dL — ABNORMAL HIGH (ref 70–99)
Glucose-Capillary: 86 mg/dL (ref 70–99)
Glucose-Capillary: 92 mg/dL (ref 70–99)

## 2019-07-29 LAB — CK: Total CK: 4700 U/L — ABNORMAL HIGH (ref 49–397)

## 2019-07-29 MED ORDER — MAGNESIUM SULFATE 2 GM/50ML IV SOLN
2.0000 g | Freq: Once | INTRAVENOUS | Status: AC
  Administered 2019-07-29: 17:00:00 2 g via INTRAVENOUS
  Filled 2019-07-29: qty 50

## 2019-07-29 MED ORDER — LORAZEPAM 2 MG/ML IJ SOLN
1.0000 mg | Freq: Once | INTRAMUSCULAR | Status: AC
  Administered 2019-07-29: 1 mg via INTRAVENOUS
  Filled 2019-07-29: qty 1

## 2019-07-29 MED ORDER — HALOPERIDOL LACTATE 5 MG/ML IJ SOLN
2.5000 mg | Freq: Four times a day (QID) | INTRAMUSCULAR | Status: DC | PRN
  Administered 2019-07-29: 13:00:00 2.5 mg via INTRAMUSCULAR

## 2019-07-29 MED ORDER — CHLORHEXIDINE GLUCONATE 0.12 % MT SOLN
OROMUCOSAL | Status: AC
  Administered 2019-07-29: 10:00:00
  Filled 2019-07-29: qty 15

## 2019-07-29 MED ORDER — SODIUM CHLORIDE 0.9% FLUSH: 10.0000 mL | INTRAVENOUS | Status: DC | PRN

## 2019-07-29 MED ORDER — HALOPERIDOL LACTATE 5 MG/ML IJ SOLN
2.5000 mg | Freq: Four times a day (QID) | INTRAMUSCULAR | Status: DC | PRN
  Administered 2019-07-30: 2.5 mg via INTRAVENOUS
  Filled 2019-07-29: qty 1

## 2019-07-29 MED ORDER — POTASSIUM CHLORIDE 10 MEQ/100ML IV SOLN: 10.0000 meq | INTRAVENOUS | Status: AC

## 2019-07-29 MED ORDER — POTASSIUM CHLORIDE CRYS ER 20 MEQ PO TBCR
40.0000 meq | EXTENDED_RELEASE_TABLET | Freq: Once | ORAL | Status: AC
  Administered 2019-07-29: 14:00:00 40 meq via ORAL
  Filled 2019-07-29: qty 2

## 2019-07-29 NOTE — Plan of Care (Signed)
  Problem: Education: Goal: Knowledge of General Education information will improve Description: Including pain rating scale, medication(s)/side effects and non-pharmacologic comfort measures Outcome: Progressing   Problem: Health Behavior/Discharge Planning: Goal: Ability to manage health-related needs will improve Outcome: Progressing   Problem: Clinical Measurements: Goal: Ability to maintain clinical measurements within normal limits will improve Outcome: Progressing Goal: Will remain free from infection Outcome: Progressing Goal: Diagnostic test results will improve Outcome: Progressing Goal: Respiratory complications will improve Outcome: Progressing Goal: Cardiovascular complication will be avoided Outcome: Progressing   Patient is more alert follows simple commands today such as make a fist and squeeze hand. D/c sitter but still in wrist restraints due to pulling at lines.

## 2019-07-29 NOTE — Progress Notes (Signed)
PROGRESS NOTE    Tyler [name]  TZG:[id number] DOB: 1971-10-15 DOA: 07/23/2019 PCP: Patient, No Pcp Per   Brief Narrative:  47 year old with history of polysubstance abuse including heroin was initially brought to [facility] ER after being found down with a syringe nearby.  He was given Narcan with minimal improvement.  Found in severe rhabdomyolysis with acute kidney injury and hyperkalemia.  He was started on fluids including bicarb drip and transferred to [facility] for further care management.  Rhabdomyolysis improved but he had total investigation for metabolic encephalopathy and was found to have acute bilateral cerebellar infarct.   Assessment & Plan:   Principal Problem:   Rhabdomyolysis Active Problems:   Acute kidney failure (HCC)   Acute metabolic encephalopathy   Polysubstance abuse (HCC)   Hyperkalemia   Transaminitis   HTN (hypertension)   Cerebral embolism with cerebral infarction  Metabolic encephalopathy is multifactorial - Secondary to his drug use, uremia, heavy alcohol use, drug use, urinary tract infection.  Each issues addressed below.  Acute metabolic encephalopathy, hyperactive and delirious today. Acute bilateral cerebrovascular CVA, nonhemorrhagic -CT of the head and neck is negative for any traumatic finding.  UDS-negative.  Tylenol and salicylate levels-negative. Cont ASA -Ammonia still slightly high, lactulose twice daily p.o. or rectally ordered. -No focal neuro deficits,  -TSH, B12-within normal limits, folate- normal - MRI brain- acute CVA -EEG- generalized diffuse slowing but no seizure activity Continue aspirin, LDL 83, hemoglobin A1c 5.1 -Echocardiogram- still pending, difficult to obtain.  -Neurology following.  Severe rhabdomyolysis with acute kidney injury AKI, Improving. -CK= better.  - Bicarb improving, continue IV fluids. - Renal ultrasound-negative  Hypokalemia -Aggressive repletion  Transaminitis trending down Elevated total  bilirubin - AST> ALT, concern for alcoholic hepatitis.  Also LFTs trending down.  Total bilirubin climbing.  Right upper quadrant ultrasound- hepatic steatosis with early cirrhosis.  Some gallbladder wall thickening  Alcohol use per history -Patient unable to tell me if he drinks alcohol daily or not.  We will place him on withdrawal protocol.  Leukocytosis, resolved Urinary tract infection, present on admission -Completed 5-day course of Rocephin -Urine culture -multiple organisms, suggestion recollection  Essential hypertension -Not on any home medications.  Lost IV access, we need IV access as its medically indicated for meds while he is AMS. Will order PICC>   DVT prophylaxis: Heparin Code Status: Full code Family Communication: Daughter and brother aware Disposition Plan: [place] Stay. Not stable for discharge given his mentation.   Consultants:   None  Procedures:   None  Antimicrobials:   None   Subjective: Patient little awakebut still uncooperative.   Review of Systems Otherwise negative except as per HPI, including: Difficult to obtain Objective: Vitals:   07/28/19 2258 07/29/19 0116 07/29/19 0348 07/29/19 0508  BP: (!) 178/100 (!) 180/117 (!) 185/111 (!) 116/99  Pulse:   99 (!) 102  Resp:   19   Temp:  98.6 F (37 C) 97.7 F (36.5 C)   TempSrc:  Axillary Axillary   SpO2:  99% 98%   Weight:        Intake/Output Summary (Last 24 hours) at 07/29/2019 1108 Last data filed at 07/29/2019 7591 Gross per 24 hour  Intake 1410.01 ml  Output 1850 ml  Net -439.99 ml   Filed Weights   07/24/19 0624  Weight: 73 kg    Examination:  Constitutional: drwosy but easily arousable.  Eyes: PERRL, lids and conjunctivae normal ENMT: Mucous membranes are moist. Posterior pharynx clear of any  exudate or lesions.Normal dentition.  Neck: normal, supple, no masses, no thyromegaly Respiratory: clear to auscultation bilaterally, no wheezing, no crackles. Normal  respiratory effort. No accessory muscle use.  Cardiovascular: Regular rate and rhythm, no murmurs / rubs / gallops. No extremity edema. 2+ pedal pulses. No carotid bruits.  Abdomen: no tenderness, no masses palpated. No hepatosplenomegaly. Bowel sounds positive.  Musculoskeletal: no clubbing / cyanosis. No joint deformity upper and lower extremities. Good ROM, no contractures. Normal muscle tone.  Skin: no rashes, lesions, ulcers. No induration Neurologic: Drwosy, no focal neuro deficit, groslly moves all the extremities.  Psychiatric: Unable to assess.    Data Reviewed:   CBC: Recent Labs  Lab 07/25/19 0356 07/26/19 0227 07/27/19 0331 07/28/19 0246 07/29/19 0403  WBC 12.8* 11.8* 8.8 8.5 9.5  HGB 13.6 13.8 13.8 12.2* 12.2*  HCT 41.4 42.0 42.4 37.9* 37.6*  MCV 87.3 87.5 89.3 89.4 88.7  PLT 217 208 189 171 275*   Basic Metabolic Panel: Recent Labs  Lab 07/25/19 0356 07/26/19 0227 07/27/19 0331 07/28/19 0246 07/29/19 0403  NA 141 142 145 141 142  K 3.9 3.3* 3.7 2.6* 3.2*  CL 96* 96* 103 102 107  CO2 _0 GLUCOSE 97 99 85 139* 123*  BUN 69* 72* 76* 66* 48*  CREATININE 5.64* 5.79* 5.42* 4.18* 3.30*  CALCIUM 7.7* 8.1* 8.9 8.5* 8.7*  MG 2.1 2.1 2.1 1.9 1.5*   GFR: CrCl cannot be calculated (Unknown ideal weight.). Liver Function Tests: Recent Labs  Lab 07/25/19 0356 07/26/19 0227 07/27/19 0331 07/28/19 0246 07/29/19 0403  AST 927* 710* 635* 392* 299*  ALT 668* 508* 433* 307* 268*  ALKPHOS 120 118 100 99 95  BILITOT 1.9* 3.5* 3.3* 4.0* 4.6*  PROT 5.8* 6.7 6.6 6.3* 6.6  ALBUMIN 2.3* 2.6* 2.9* 2.6* 2.9*   No results for input(s): LIPASE, AMYLASE in the last 168 hours. Recent Labs  Lab 07/23/19 1947 07/25/19 0356 07/27/19 0331  AMMONIA 55* 21 42*   Coagulation Profile: No results for input(s): INR, PROTIME in the last 168 hours. Cardiac Enzymes: Recent Labs  Lab 07/25/19 0356 07/26/19 0227 07/27/19 0331 07/28/19 0246 07/29/19 0403  CKTOTAL  27,691* 21,050* 21,620* 7,230* 4,700*   BNP (last 3 results) No results for input(s): PROBNP in the last 8760 hours. HbA1C: Recent Labs    07/27/19 0331  HGBA1C 5.1   CBG: Recent Labs  Lab 07/28/19 0647 07/28/19 1156 07/28/19 1815 07/29/19 0022 07/29/19 0754  GLUCAP 78 110* 104* 107* 92   Lipid Profile: Recent Labs    07/27/19 0331  CHOL 150  HDL 19*  LDLCALC 83  TRIG 239*  CHOLHDL 7.9   Thyroid Function Tests: No results for input(s): TSH, T4TOTAL, FREET4, T3FREE, THYROIDAB in the last 72 hours. Anemia Panel: No results for input(s): VITAMINB12, FOLATE, FERRITIN, TIBC, IRON, RETICCTPCT in the last 72 hours. Sepsis Labs: Recent Labs  Lab 07/23/19 1947 07/23/19 1955 07/23/19 2300 07/24/19 0159 07/25/19 0356  PROCALCITON 13.58  --   --  0.11 5.64  LATICACIDVEN  --  1.8 1.9  --   --     Recent Results (from the past 240 hour(s))  SARS Coronavirus 2 [facility] order, Performed in [place] hospital lab)     Status: None   Collection Time: 07/23/19  9:57 PM  Result Value Ref Range Status   SARS Coronavirus 2 NEGATIVE NEGATIVE Final    Comment: (NOTE) If result is NEGATIVE SARS-CoV-2 target nucleic acids are NOT DETECTED.  The SARS-CoV-2 RNA is generally detectable in upper and lower  respiratory specimens during the acute phase of infection. The lowest  concentration of SARS-CoV-2 viral copies this assay can detect is 250  copies / mL. A negative result does not preclude SARS-CoV-2 infection  and should not be used as the sole basis for treatment or other  patient management decisions.  A negative result may occur with  improper specimen collection / handling, submission of specimen other  than nasopharyngeal swab, presence of viral mutation(s) within the  areas targeted by this assay, and inadequate number of viral copies  (<250 copies / mL). A negative result must be combined with clinical  observations, patient history, and epidemiological information.  If result is POSITIVE SARS-CoV-2 target nucleic acids are DETECTED. The SARS-CoV-2 RNA is generally detectable in upper and lower  respiratory specimens dur ing the acute phase of infection.  Positive  results are indicative of active infection with SARS-CoV-2.  Clinical  correlation with patient history and other diagnostic information is  necessary to determine patient infection status.  Positive results do  not rule out bacterial infection or co-infection with other viruses. If result is PRESUMPTIVE POSTIVE SARS-CoV-2 nucleic acids MAY BE PRESENT.   A presumptive positive result was obtained on the submitted specimen  and confirmed on repeat testing.  While 2019 novel coronavirus  (SARS-CoV-2) nucleic acids may be present in the submitted sample  additional confirmatory testing may be necessary for epidemiological  and / or clinical management purposes  to differentiate between  SARS-CoV-2 and other Sarbecovirus currently known to infect humans.  If clinically indicated additional testing with an alternate test  methodology [phone]) is advised. The SARS-CoV-2 RNA is generally  detectable in upper and lower respiratory sp ecimens during the acute  phase of infection. The expected result is Negative. Fact Sheet for Patients:  StrictlyIdeas.no Fact Sheet for Healthcare Providers: BankingDealers.co.za This test is not yet approved or cleared by the [place] FDA and has been authorized for detection and/or diagnosis of SARS-CoV-2 by FDA under an Emergency Use Authorization (EUA).  This EUA will remain in effect (meaning this test can be used) for the duration of the COVID-19 declaration under Section 564(b)(1) of the Act, 21 U.S.C. section 360bbb-3(b)(1), unless the authorization is terminated or revoked sooner. Performed at [facility], [place] [address], [place], [place] [address]   Culture, blood (routine x 2)     Status:  None   Collection Time: 07/23/19 10:41 PM   Specimen: BLOOD LEFT HAND  Result Value Ref Range Status   Specimen Description BLOOD LEFT HAND  Final   Special Requests   Final    AEROBIC BOTTLE ONLY Blood Culture results may not be optimal due to an inadequate volume of blood received in culture bottles   Culture   Final    NO GROWTH 5 DAYS Performed at [facility], [place] [address], [place], [place] [address]    Report Status 07/29/2019 FINAL  Final  Culture, blood (routine x 2)     Status: None   Collection Time: 07/23/19 10:50 PM   Specimen: BLOOD  Result Value Ref Range Status   Specimen Description BLOOD LEFT ANTECUBITAL  Final   Special Requests   Final    AEROBIC BOTTLE ONLY Blood Culture results may not be optimal due to an inadequate volume of blood received in culture bottles   Culture   Final    NO GROWTH 5 DAYS Performed at  [facility], [place] [address] [place] [address], [place], [place] [address]    Report Status 07/29/2019 FINAL  Final  MRSA PCR Screening     Status: None   Collection Time: 07/24/19  2:10 AM   Specimen: Nasal Mucosa; Nasopharyngeal  Result Value Ref Range Status   MRSA by PCR NEGATIVE NEGATIVE Final    Comment: Performed at [facility], [place] [address] [place], [place] [address]  Culture, Urine     Status: Abnormal   Collection Time: 07/25/19  1:39 PM   Specimen: Urine, Random  Result Value Ref Range Status   Specimen Description URINE, RANDOM  Final   Special Requests   Final    NONE Performed at [facility], [place] [address], [place], [place] [address]    Culture MULTIPLE SPECIES PRESENT, SUGGEST RECOLLECTION (A)  Final   Report Status 07/26/2019 FINAL  Final  Culture, blood (Routine X 2) w Reflex to ID Panel     Status: None (Preliminary result)   Collection Time: 07/28/19 12:11 PM   Specimen: BLOOD LEFT ARM  Result Value Ref Range Status   Specimen Description BLOOD LEFT ARM  Final   Special Requests   Final    BOTTLES DRAWN AEROBIC  AND ANAEROBIC Blood Culture adequate volume   Culture   Final    NO GROWTH < 24 HOURS Performed at [facility], [place] [address] [place], [place] [address]    Report Status PENDING  Incomplete  Culture, blood (Routine X 2) w Reflex to ID Panel     Status: None (Preliminary result)   Collection Time: 07/28/19 12:12 PM   Specimen: BLOOD LEFT ARM  Result Value Ref Range Status   Specimen Description BLOOD LEFT ARM  Final   Special Requests   Final    BOTTLES DRAWN AEROBIC ONLY Blood Culture adequate volume   Culture   Final    NO GROWTH < 24 HOURS Performed at [facility], [place] [address], [place], [place] [address]    Report Status PENDING  Incomplete         Radiology Studies: Vas US Carotid (at [place] Only)  Result Date: 07/27/2019 Carotid Arterial Duplex Study Indications: CVA. Limitations  Today's exam was limited due to the patient's inability or              unwillingness to cooperate. Performing Technologist: Maudry Mayhew RDMS, RVT, RDCS  Examination Guidelines: A complete evaluation includes B-mode imaging, spectral Doppler, color Doppler, and power Doppler as needed of all accessible portions of each vessel. Bilateral testing is considered an integral part of a complete examination. Limited examinations for reoccurring indications may be performed as noted.  Right Carotid Findings: +----------+--------+-------+--------+----------------------+------------------+           PSV cm/sEDV    StenosisPlaque Description    Comments                             cm/s                                                    +----------+--------+-------+--------+----------------------+------------------+ CCA Prox  84      12  intimal thickening +----------+--------+-------+--------+----------------------+------------------+ CCA Distal64      14             smooth and                                                                 heterogenous                             +----------+--------+-------+--------+----------------------+------------------+ ICA Prox  63      25             smooth and                                                                heterogenous                             +----------+--------+-------+--------+----------------------+------------------+ ICA Distal85      19                                                      +----------+--------+-------+--------+----------------------+------------------+ ECA       72      17                                                      +----------+--------+-------+--------+----------------------+------------------+ +----------+--------+-------+----------------+-------------------+           PSV cm/sEDV cmsDescribe        Arm Pressure (mmHG) +----------+--------+-------+----------------+-------------------+ [id number]             Multiphasic, WNL                    +----------+--------+-------+----------------+-------------------+ +---------+--------+--+--------+--+---------+ VertebralPSV cm/s33EDV cm/s10Antegrade +---------+--------+--+--------+--+---------+   Left Carotid Findings: +----------+--------+--------+--------+-----------------------+--------------+           PSV cm/sEDV cm/sStenosisPlaque Description     Comments       +----------+--------+--------+--------+-----------------------+--------------+ CCA Prox  108     22              smooth and heterogenous               +----------+--------+--------+--------+-----------------------+--------------+ CCA Distal84      17                                                    +----------+--------+--------+--------+-----------------------+--------------+ ICA Prox  69      22                                                    +----------+--------+--------+--------+-----------------------+--------------+  ICA Distal81      30                                                     +----------+--------+--------+--------+-----------------------+--------------+ ECA                                                      Not visualized +----------+--------+--------+--------+-----------------------+--------------+ +----------+--------+--------+----------------+-------------------+ SubclavianPSV cm/sEDV cm/sDescribe        Arm Pressure (mmHG) +----------+--------+--------+----------------+-------------------+           91              Multiphasic, WNL                    +----------+--------+--------+----------------+-------------------+ +---------+--------+--+--------+--+---------+ VertebralPSV cm/s51EDV cm/s14Antegrade +---------+--------+--+--------+--+---------+   Summary: Right Carotid: Velocities in the right ICA are consistent with a 1-39% stenosis. Left Carotid: Velocities in the left ICA are consistent with a 1-39% stenosis. Vertebrals:  Bilateral vertebral arteries demonstrate antegrade flow. Subclavians: Normal flow hemodynamics were seen in bilateral subclavian              arteries. *See table(s) above for measurements and observations.  Electronically signed by Antony Contras MD on 07/27/2019 at 3:29:53 PM.    Final    [place] Ekg Site Rite  Result Date: 07/29/2019 If [facility] image not attached, placement could not be confirmed due to current cardiac rhythm.       Scheduled Meds: .  stroke: mapping our early stages of recovery book   Does not apply Once  . aspirin  300 mg Rectal Daily   Or  . aspirin  325 mg Oral Daily  . chlorhexidine gluconate (MEDLINE KIT)  15 mL Mouth Rinse BID  . cyanocobalamin  1,000 mcg Intramuscular Q30 days  . folic acid  1 mg Intravenous Daily  . heparin  5,000 Units Subcutaneous Q8H  . lactulose  20 g Oral BID   Or  . lactulose  300 mL Rectal BID  . mouth rinse  15 mL Mouth Rinse 10 times per day  . multivitamin  15 mL Oral Daily  . thiamine  100 mg Oral Daily   Or  .  thiamine  100 mg Intravenous Daily   Continuous Infusions: . cefTRIAXone (ROCEPHIN)  IV Stopped (07/28/19 1015)  . dextrose 5 % and 0.9% NaCl 125 mL/hr at 07/29/19 0631  . magnesium sulfate bolus IVPB    . potassium chloride       LOS: 6 days   Time spent= 25 mins     Arsenio Loader, MD [facility]  If 7PM-7AM, please contact night-coverage [email or web address] 07/29/2019, 11:08 AM

## 2019-07-29 NOTE — Progress Notes (Signed)
Bilateral lower extremity venous duplex completed. Preliminary results in Chart review CV Proc. [place] C[place],RVS 07/29/2019, 11:31 AM

## 2019-07-29 NOTE — Progress Notes (Signed)
Spoke with Gigi Gin, made her aware that the IV nurse assessed the patient and recommends a PICC line or central for this patient. The patient has had 3 PIVs all of which have either infiltrated or been removed by patient, but RN is not 100 percent sure. The patient has several runs of potassium and antibiotics that are needed and the patient has very limited IV access. Dr. Reesa Chew was notified through the messenger system by IV team

## 2019-07-29 NOTE — Progress Notes (Signed)
Peripherally Inserted Central Catheter/Midline Placement  The IV Nurse has discussed with the patient and/or persons authorized to consent for the patient, the purpose of this procedure and the potential benefits and risks involved with this procedure.  The benefits include less needle sticks, lab draws from the catheter, and the patient may be discharged home with the catheter. Risks include, but not limited to, infection, bleeding, blood clot (thrombus formation), and puncture of an artery; nerve damage and irregular heartbeat and possibility to perform a PICC exchange if needed/ordered by physician.  Alternatives to this procedure were also discussed.  Bard Power PICC patient education guide, fact sheet on infection prevention and patient information card has been provided to patient /or left at bedside.  Telephone consent obtained from daughter.  PICC/Midline Placement Documentation  PICC Double Lumen 07/29/19 PICC Right Brachial 39 cm 3 cm (Active)  Indication for Insertion or Continuance of Line Prolonged intravenous therapies;Limited venous access - need for IV therapy >5 days (PICC only);Chronic illness with exacerbations (CF, Sickle Cell, etc.) 07/29/19 1340  Exposed Catheter (cm) 3 cm 07/29/19 1340  Site Assessment Clean;Dry;Intact 07/29/19 1340  Lumen #1 Status Flushed;Saline locked;Blood return noted 07/29/19 1340  Lumen #2 Status Flushed;Saline locked;Blood return noted 07/29/19 1340  Dressing Type Transparent 07/29/19 1340  Dressing Status Clean;Dry;Intact;Antimicrobial disc in place 07/29/19 [place] checked and tightened 07/29/19 1340  Line Adjustment (NICU/IV Team Only) No 07/29/19 1340  Dressing Intervention New dressing 07/29/19 1340  Dressing Change Due 08/05/19 07/29/19 1340       Rolena Infante 07/29/2019, 1:41 PM

## 2019-07-29 NOTE — Progress Notes (Signed)
Upon arrival to pt room, pt combative in bil wrist restraints.Eyes open, unable to answer questions.  Jasmin RN gave IM Haldol as ordered prior to procedure. Jule Economy PICC RN at bedside to help hold pt during procedure, pt remained very combative and restless during procedure.   Completed procedure, CHG cleaning performed prior to procedure and prior to placing dressing over site.  Secure port applied to area.  RN notified to wrap with guaze to aid in prevention of pulling it out.

## 2019-07-29 NOTE — Progress Notes (Signed)
STROKE TEAM PROGRESS NOTE   INTERVAL HISTORY Pt more awake alert than yesterday, now on 2-point restrain at both hands. Off bedside sitter. Pt able to open eyes, mumbling words, but still not following commands. rhabdo and AKI continues to improve.    Vitals:   07/28/19 2258 07/29/19 0116 07/29/19 0348 07/29/19 0508  BP: (!) 178/100 (!) 180/117 (!) 185/111 (!) 116/99  Pulse:   99 (!) 102  Resp:   19   Temp:  98.6 F (37 C) 97.7 F (36.5 C)   TempSrc:  Axillary Axillary   SpO2:  99% 98%   Weight:        CBC:  Recent Labs  Lab 07/28/19 0246 07/29/19 0403  WBC 8.5 9.5  HGB 12.2* 12.2*  HCT 37.9* 37.6*  MCV 89.4 88.7  PLT 171 148*    Basic Metabolic Panel:  Recent Labs  Lab 07/28/19 0246 07/29/19 0403  NA 141 142  K 2.6* 3.2*  CL 102 107  CO2 26 24  GLUCOSE 139* 123*  BUN 66* 48*  CREATININE 4.18* 3.30*  CALCIUM 8.5* 8.7*  MG 1.9 1.5*   Lipid Panel:     Component Value Date/Time   CHOL 150 07/27/2019 0331   TRIG 239 (H) 07/27/2019 0331   HDL 19 (L) 07/27/2019 0331   CHOLHDL 7.9 07/27/2019 0331   VLDL 48 (H) 07/27/2019 0331   LDLCALC 83 07/27/2019 0331   HgbA1c:  Lab Results  Component Value Date   HGBA1C 5.1 07/27/2019   Urine Drug Screen:     Component Value Date/Time   LABOPIA NONE DETECTED 07/28/2019 0951   COCAINSCRNUR NONE DETECTED 07/28/2019 0951   LABBENZ POSITIVE (A) 07/28/2019 0951   AMPHETMU POSITIVE (A) 07/28/2019 0951   THCU NONE DETECTED 07/28/2019 0951   LABBARB NONE DETECTED 07/28/2019 0951    Alcohol Level     Component Value Date/Time   ETH 127 (H) 04/20/2012 1839    IMAGING  Vas US Carotid (at [place] Only) 07/27/2019 Summary:  Right Carotid: Velocities in the right ICA are consistent with a 1-39% stenosis.  Left Carotid: Velocities in the left ICA are consistent with a 1-39% stenosis.  Vertebrals:  Bilateral vertebral arteries demonstrate antegrade flow.  Subclavians: Normal flow hemodynamics were seen in bilateral  subclavian arteries.    PHYSICAL EXAM Frail malnourished looking middle-aged Caucasian male not in distress but mildly agitated with stimulation. Afebrile. Head is nontraumatic. Neck is supple without bruit.  Cardiac exam no murmur or gallop. Lungs are clear to auscultation. Distal pulses are well felt.  Neurological Exam :  Patient is more awake alert than yesterday, eyes half open, mildly agitated with voice or pain stimulation, not cooperative on exam.  Able to track on both sides with repetitive voice stimulation, not consistently blinking to visual threat. PERRL. There is no obvious facial weakness.  Tongue midline in mouth.  He is able to move all 4 extremities spontaneously against gravity and there does not appear to be focal weakness. Sensation, coordination and gait not tested.   ASSESSMENT/PLAN [name] is a 48 y.o. male with history of alcohol and drug use found unresponsive with rhabdo and AKI. MRI done for altered mental status shows multifocal patchy infarcts throughout the brain. Transferred from [facility]   Stroke: Patchy B anterior and posterior infarcts embolic, embolic pattern, could be due to severe dehydration AKI in the setting of diffuse intracranial stenosis. However, cardioembolic source cannot rule out such as afib or endocarditis  Resultant - pt still agitation on restrain, not following commands  MRI  Patchy acute B cerebral and cerebellar infarcts. R temporal lobe encephalomalacia and chronic cerebral white matter disease   Consider MRA head or CTA head/neck if Cre normalizes once stable and cooperative  Carotid Doppler unremarkable  2D Echo pending  LE venous doppler pending  EEG no sz, diffuse encephalopathy  May consider 30 day cardiac event monitoring as outpt to rule out afib if above work up unrevealing.   LDL 83  HgbA1c 5.1  UDS - positive for amphetamines and benzodiazepine  Blood culture pending -  no growth so  far  Heparin 5000 units sq tid for VTE prophylaxis  No antithrombotic prior to admission, now on aspirin 325 mg daily or ASA 300 PR.   Therapy recommendations:  SNF  Disposition:  pending   Metabolic encephalopathy  Likely due to rhabodo, AKI, elevated CK, LFTs and ammonia as well as alcohol withdraw  Treatment per primary team  EEG no seizure  Continue current manatement  Rhabdomyolysis AKI Elevated AST/ALT  Found down at home   Cre 5.79->5.42->4.18->3.30  Continue IVF  CK and AST/ALT continues to improve  Continue monitoring   Elevated ammonia  On lactulose  Ammonia 55->21->42  Ammonia repeat pending in am  Continue monitoring  Alcohol abuse  On CIWA protocol  On B1/FA/MVI  Seizure precautions  Hypertensive Emergency  BP as high as 237/138  Stable now . Permissive hypertension (OK if < 220/120) but gradually normalize in 5-7 days . Long-term BP goal normotensive  Hyperlipidemia  Home meds:  No statin  LDL 83, goal < 70  No statin for now given transaminitis  UTI  UA WBC  50  Leukocytosis resolved  Blood culture pending - no growth so far  On rocephine  Dysphagia   Decrease po intake  Not taking much po  Continue IVF but change D51/2NS to D5NS given stroke  Tobacco abuse  Current smoker  Smoking cessation counseling will be provided  Other Stroke Risk Factors  Hx of drug abues. UDS positive for cocaine in 2013  Other Active Problems  leukocytopsis WBC 12.8->11.8->8.5  Hypokalemia - K 2.6 ->3.2 -> further supplement ordered  Hospital day # 6  Marvel PlanJindong Antoinette Haskett, MD PhD Stroke Neurology 07/29/2019 10:25 AM  To contact Stroke Continuity provider, please refer to [email or web address]. After hours, contact General Neurology

## 2019-07-29 NOTE — Plan of Care (Signed)
Problem: Education: Goal: Knowledge of General Education information will improve Description: Including pain rating scale, medication(s)/side effects and non-pharmacologic comfort measures 07/29/2019 1938 by Burtis Junesrewery, Maxi Carreras, RN Outcome: Progressing 07/29/2019 1546 by Burtis Junesrewery, Layann Bluett, RN Outcome: Progressing   Problem: Health Behavior/Discharge Planning: Goal: Ability to manage health-related needs will improve 07/29/2019 1938 by Burtis Junesrewery, Dawnell Bryant, RN Outcome: Progressing 07/29/2019 1546 by Burtis Junesrewery, Torrin Frein, RN Outcome: Progressing   Problem: Clinical Measurements: Goal: Ability to maintain clinical measurements within normal limits will improve 07/29/2019 1938 by Burtis Junesrewery, Carmelita Amparo, RN Outcome: Progressing 07/29/2019 1546 by Burtis Junesrewery, Skylor Hughson, RN Outcome: Progressing Goal: Will remain free from infection 07/29/2019 1938 by Burtis Junesrewery, Jacolyn Joaquin, RN Outcome: Progressing 07/29/2019 1546 by Burtis Junesrewery, Kippy Gohman, RN Outcome: Progressing Goal: Diagnostic test results will improve 07/29/2019 1938 by Burtis Junesrewery, Dyon Rotert, RN Outcome: Progressing 07/29/2019 1546 by Burtis Junesrewery, Kadesha Virrueta, RN Outcome: Progressing Goal: Respiratory complications will improve 07/29/2019 1938 by Burtis Junesrewery, Keiton Cosma, RN Outcome: Progressing 07/29/2019 1546 by Burtis Junesrewery, Loye Vento, RN Outcome: Progressing Goal: Cardiovascular complication will be avoided 07/29/2019 1938 by Burtis Junesrewery, Rica Heather, RN Outcome: Progressing 07/29/2019 1546 by Burtis Junesrewery, Jevaughn Degollado, RN Outcome: Progressing   Problem: Activity: Goal: Risk for activity intolerance will decrease 07/29/2019 1938 by Burtis Junesrewery, Yalonda Sample, RN Outcome: Progressing 07/29/2019 1546 by Burtis Junesrewery, Alonzo Owczarzak, RN Outcome: Progressing   Problem: Nutrition: Goal: Adequate nutrition will be maintained 07/29/2019 1938 by Burtis Junesrewery, Drevion Offord, RN Outcome: Progressing 07/29/2019 1546 by Burtis Junesrewery, Shena Vinluan, RN Outcome: Progressing   Problem: Coping: Goal: Level of anxiety will decrease 07/29/2019 1938 by Burtis Junesrewery, Corydon Schweiss, RN Outcome:  Progressing 07/29/2019 1546 by Burtis Junesrewery, Sophiah Rolin, RN Outcome: Progressing   Problem: Elimination: Goal: Will not experience complications related to bowel motility 07/29/2019 1938 by Burtis Junesrewery, Nashid Pellum, RN Outcome: Progressing 07/29/2019 1546 by Burtis Junesrewery, Haydan Wedig, RN Outcome: Progressing Goal: Will not experience complications related to urinary retention 07/29/2019 1938 by Burtis Junesrewery, Ladajah Soltys, RN Outcome: Progressing 07/29/2019 1546 by Burtis Junesrewery, Carston Riedl, RN Outcome: Progressing   Problem: Pain Managment: Goal: General experience of comfort will improve 07/29/2019 1938 by Burtis Junesrewery, Tahisha Hakim, RN Outcome: Progressing 07/29/2019 1546 by Burtis Junesrewery, Lindwood Mogel, RN Outcome: Progressing   Problem: Safety: Goal: Ability to remain free from injury will improve 07/29/2019 1938 by Burtis Junesrewery, Samanvi Cuccia, RN Outcome: Progressing 07/29/2019 1546 by Burtis Junesrewery, Ryin Schillo, RN Outcome: Progressing   Problem: Education: Goal: Knowledge of General Education information will improve Description: Including pain rating scale, medication(s)/side effects and non-pharmacologic comfort measures 07/29/2019 1938 by Burtis Junesrewery, Graclyn Lawther, RN Outcome: Progressing 07/29/2019 1546 by Burtis Junesrewery, Libi Corso, RN Outcome: Progressing   Problem: Health Behavior/Discharge Planning: Goal: Ability to manage health-related needs will improve 07/29/2019 1938 by Burtis Junesrewery, Arbor Leer, RN Outcome: Progressing 07/29/2019 1546 by Burtis Junesrewery, Paytin Ramakrishnan, RN Outcome: Progressing   Problem: Clinical Measurements: Goal: Ability to maintain clinical measurements within normal limits will improve 07/29/2019 1938 by Burtis Junesrewery, Anahli Arvanitis, RN Outcome: Progressing 07/29/2019 1546 by Burtis Junesrewery, Shahzad Thomann, RN Outcome: Progressing Goal: Will remain free from infection 07/29/2019 1938 by Burtis Junesrewery, Nelma Phagan, RN Outcome: Progressing 07/29/2019 1546 by Burtis Junesrewery, Parthenia Tellefsen, RN Outcome: Progressing Goal: Diagnostic test results will improve 07/29/2019 1938 by Burtis Junesrewery, Elija Mccamish, RN Outcome: Progressing 07/29/2019 1546  by Burtis Junesrewery, Lavada Langsam, RN Outcome: Progressing Goal: Respiratory complications will improve 07/29/2019 1938 by Burtis Junesrewery, Avenly Roberge, RN Outcome: Progressing 07/29/2019 1546 by Burtis Junesrewery, Willow Reczek, RN Outcome: Progressing Goal: Cardiovascular complication will be avoided 07/29/2019 1938 by Burtis Junesrewery, Carlyle Achenbach, RN Outcome: Progressing 07/29/2019 1546 by Burtis Junesrewery, Cresta Riden, RN Outcome: Progressing   Problem: Activity: Goal: Risk for activity intolerance will decrease 07/29/2019 1938 by Burtis Junesrewery, Dorr Perrot, RN Outcome: Progressing 07/29/2019 1546 by Burtis Junesrewery, Nickolis Diel, RN Outcome: Progressing   Problem: Coping: Goal: Level of  anxiety will decrease 07/29/2019 1938 by Verne Grain, RN Outcome: Progressing 07/29/2019 1546 by Verne Grain, RN Outcome: Progressing

## 2019-07-30 ENCOUNTER — Inpatient Hospital Stay (HOSPITAL_COMMUNITY): Payer: Medicaid Other

## 2019-07-30 DIAGNOSIS — I10 Essential (primary) hypertension: Secondary | ICD-10-CM

## 2019-07-30 DIAGNOSIS — N390 Urinary tract infection, site not specified: Secondary | ICD-10-CM

## 2019-07-30 DIAGNOSIS — E7220 Disorder of urea cycle metabolism, unspecified: Secondary | ICD-10-CM

## 2019-07-30 DIAGNOSIS — E876 Hypokalemia: Secondary | ICD-10-CM

## 2019-07-30 DIAGNOSIS — F17200 Nicotine dependence, unspecified, uncomplicated: Secondary | ICD-10-CM

## 2019-07-30 LAB — COMPREHENSIVE METABOLIC PANEL
ALT: 151 U/L — ABNORMAL HIGH (ref 0–44)
AST: 129 U/L — ABNORMAL HIGH (ref 15–41)
Albumin: 2.4 g/dL — ABNORMAL LOW (ref 3.5–5.0)
Alkaline Phosphatase: 65 U/L (ref 38–126)
Anion gap: 7 (ref 5–15)
BUN: 39 mg/dL — ABNORMAL HIGH (ref 6–20)
CO2: 21 mmol/L — ABNORMAL LOW (ref 22–32)
Calcium: 7.7 mg/dL — ABNORMAL LOW (ref 8.9–10.3)
Chloride: 118 mmol/L — ABNORMAL HIGH (ref 98–111)
Creatinine, Ser: 2.36 mg/dL — ABNORMAL HIGH (ref 0.61–1.24)
GFR calc Af Amer: 36 mL/min — ABNORMAL LOW (ref >60)
GFR calc non Af Amer: 31 mL/min — ABNORMAL LOW (ref >60)
Glucose, Bld: 565 mg/dL (ref 70–99)
Potassium: 2.8 mmol/L — ABNORMAL LOW (ref 3.5–5.1)
Sodium: 146 mmol/L — ABNORMAL HIGH (ref 135–145)
Total Bilirubin: 3.6 mg/dL — ABNORMAL HIGH (ref 0.3–1.2)
Total Protein: 5.4 g/dL — ABNORMAL LOW (ref 6.5–8.1)

## 2019-07-30 LAB — ECHOCARDIOGRAM COMPLETE: Weight: 2574.97 oz

## 2019-07-30 LAB — GLUCOSE, RANDOM: Glucose, Bld: 102 mg/dL — ABNORMAL HIGH (ref 70–99)

## 2019-07-30 LAB — GLUCOSE, CAPILLARY
Glucose-Capillary: 110 mg/dL — ABNORMAL HIGH (ref 70–99)
Glucose-Capillary: 114 mg/dL — ABNORMAL HIGH (ref 70–99)
Glucose-Capillary: 117 mg/dL — ABNORMAL HIGH (ref 70–99)
Glucose-Capillary: 94 mg/dL (ref 70–99)
Glucose-Capillary: 97 mg/dL (ref 70–99)

## 2019-07-30 LAB — AMMONIA: Ammonia: 34 umol/L (ref 9–35)

## 2019-07-30 LAB — CK: Total CK: 1650 U/L — ABNORMAL HIGH (ref 49–397)

## 2019-07-30 LAB — MAGNESIUM: Magnesium: 1.6 mg/dL — ABNORMAL LOW (ref 1.7–2.4)

## 2019-07-30 MED ORDER — INSULIN ASPART 100 UNIT/ML ~~LOC~~ SOLN: 0.0000 [IU] | Freq: Every day | SUBCUTANEOUS | Status: DC

## 2019-07-30 MED ORDER — INSULIN ASPART 100 UNIT/ML ~~LOC~~ SOLN
0.0000 [IU] | Freq: Three times a day (TID) | SUBCUTANEOUS | Status: DC
  Administered 2019-08-09 – 2019-08-24 (×11): 2 [IU] via SUBCUTANEOUS
  Administered 2019-08-25 – 2019-08-29 (×2): 3 [IU] via SUBCUTANEOUS
  Administered 2019-08-30 – 2019-09-04 (×3): 2 [IU] via SUBCUTANEOUS
  Administered 2019-09-05: 1 [IU] via SUBCUTANEOUS
  Administered 2019-09-06 – 2019-09-09 (×5): 2 [IU] via SUBCUTANEOUS
  Administered 2019-09-10: 3 [IU] via SUBCUTANEOUS
  Administered 2019-09-12: 2 [IU] via SUBCUTANEOUS
  Administered 2019-09-12 – 2019-09-14 (×3): 3 [IU] via SUBCUTANEOUS
  Administered 2019-09-14 – 2019-09-15 (×3): 2 [IU] via SUBCUTANEOUS

## 2019-07-30 MED ORDER — SODIUM CHLORIDE 0.45 % IV SOLN
INTRAVENOUS | Status: DC
  Administered 2019-07-30 – 2019-08-01 (×5): via INTRAVENOUS

## 2019-07-30 MED ORDER — INSULIN ASPART 100 UNIT/ML ~~LOC~~ SOLN: 10.0000 [IU] | Freq: Once | SUBCUTANEOUS | Status: DC

## 2019-07-30 MED ORDER — MAGNESIUM SULFATE 2 GM/50ML IV SOLN
2.0000 g | Freq: Once | INTRAVENOUS | Status: AC
  Administered 2019-07-30: 2 g via INTRAVENOUS
  Filled 2019-07-30: qty 50

## 2019-07-30 MED ORDER — POTASSIUM CHLORIDE CRYS ER 20 MEQ PO TBCR
40.0000 meq | EXTENDED_RELEASE_TABLET | Freq: Three times a day (TID) | ORAL | Status: AC
  Administered 2019-07-30 (×3): 40 meq via ORAL
  Filled 2019-07-30 (×3): qty 2

## 2019-07-30 MED ORDER — AMLODIPINE BESYLATE 10 MG PO TABS
10.0000 mg | ORAL_TABLET | Freq: Every day | ORAL | Status: DC
  Administered 2019-07-30 – 2019-08-01 (×3): 10 mg via ORAL
  Filled 2019-07-30 (×4): qty 1

## 2019-07-30 NOTE — Progress Notes (Signed)
Lab reports critical glucose of 565.  MD aware.

## 2019-07-30 NOTE — Progress Notes (Signed)
STROKE TEAM PROGRESS NOTE   INTERVAL HISTORY Pt same as yesterday, still on 2-point restrain at both hands. But able to have 2D echo done. EF 60-65%. Cre 2.36 continues to improve.    Vitals:   07/30/19 0429 07/30/19 0513 07/30/19 0600 07/30/19 0839  BP: (!) 177/101 (!) 158/99 (!) 150/100 (!) 185/93  Pulse:   95 94  Resp: 15 14  18   Temp: 97.8 F (36.6 C)  97.9 F (36.6 C) 97.8 F (36.6 C)  TempSrc:   Oral Axillary  SpO2: 96%   98%  Weight:        CBC:  Recent Labs  Lab 07/28/19 0246 07/29/19 0403  WBC 8.5 9.5  HGB 12.2* 12.2*  HCT 37.9* 37.6*  MCV 89.4 88.7  PLT 171 148*    Basic Metabolic Panel:  Recent Labs  Lab 07/29/19 0403 07/30/19 0900  NA 142 146*  K 3.2* 2.8*  CL 107 118*  CO2 24 21*  GLUCOSE 123* 565*  BUN 48* 39*  CREATININE 3.30* 2.36*  CALCIUM 8.7* 7.7*  MG 1.5* 1.6*   Lipid Panel:     Component Value Date/Time   CHOL 150 07/27/2019 0331   TRIG 239 (H) 07/27/2019 0331   HDL 19 (L) 07/27/2019 0331   CHOLHDL 7.9 07/27/2019 0331   VLDL 48 (H) 07/27/2019 0331   LDLCALC 83 07/27/2019 0331   HgbA1c:  Lab Results  Component Value Date   HGBA1C 5.1 07/27/2019   Urine Drug Screen:     Component Value Date/Time   LABOPIA NONE DETECTED 07/28/2019 0951   COCAINSCRNUR NONE DETECTED 07/28/2019 0951   LABBENZ POSITIVE (A) 07/28/2019 0951   AMPHETMU POSITIVE (A) 07/28/2019 0951   THCU NONE DETECTED 07/28/2019 0951   LABBARB NONE DETECTED 07/28/2019 0951    Alcohol Level     Component Value Date/Time   ETH 127 (H) 04/20/2012 1839    IMAGING  Vas US Carotid (at [place] Only) 07/27/2019 Summary:  Right Carotid: Velocities in the right ICA are consistent with a 1-39% stenosis.  Left Carotid: Velocities in the left ICA are consistent with a 1-39% stenosis.  Vertebrals:  Bilateral vertebral arteries demonstrate antegrade flow.  Subclavians: Normal flow hemodynamics were seen in bilateral subclavian arteries.    PHYSICAL EXAM Frail  malnourished looking middle-aged Caucasian male not in distress but mildly agitated with stimulation. Afebrile. Head is nontraumatic. Neck is supple without bruit.  Cardiac exam no murmur or gallop. Lungs are clear to auscultation. Distal pulses are well felt.  Neurological Exam :  Patient is still not following commands, eyes closed, mildly agitated with voice or pain stimulation, not cooperative on exam. Nonverbal. Briefly open eyes on voice but not tracking, not blinking to visual threat. PERRL. There is no obvious facial weakness.  Tongue midline in mouth.  He is able to move all 4 extremities spontaneously against gravity and there does not appear to be focal weakness. Sensation, coordination and gait not tested.   ASSESSMENT/PLAN Mr. Binnie Vonderhaar is a 48 y.o. male with history of alcohol and drug use found unresponsive with rhabdo and AKI. MRI done for altered mental status shows multifocal patchy infarcts throughout the brain. Transferred from [facility]   Stroke: Patchy B anterior and posterior infarcts embolic, embolic pattern, could be due to severe dehydration AKI in the setting of diffuse intracranial stenosis. However, cardioembolic source cannot rule out such as afib or endocarditis   Resultant - pt still agitation on restrain, not following commands  MRI  Patchy acute B cerebral and cerebellar infarcts. R temporal lobe encephalomalacia and chronic cerebral white matter disease   Consider MRA head or CTA head/neck if Cre normalizes once stable and cooperative  Carotid Doppler unremarkable  2D Echo EF 60-65%  LE venous doppler no DVT  EEG no sz, diffuse encephalopathy  May consider 30 day cardiac event monitoring as outpt to rule out afib if above work up unrevealing and pt neuro improves.   LDL 83  HgbA1c 5.1  UDS - positive for amphetamines and benzodiazepine  Blood culture NGTD  Heparin 5000 units sq tid for VTE prophylaxis  No antithrombotic prior to  admission, now on aspirin 325 mg daily or ASA 300 PR.   Therapy recommendations:  SNF  Disposition:  pending   Metabolic encephalopathy  Likely due to rhabodo, AKI, elevated CK, LFTs and ammonia as well as alcohol withdraw  Treatment per primary team  EEG no seizure  Continue current manatement  Rhabdomyolysis AKI Elevated AST/ALT  Found down at home   Cre 5.79->5.42->4.18->3.30->2.36  Continue IVF  CK and AST/ALT continues to improve  Continue monitoring   Elevated ammonia  On lactulose  Ammonia 55->21->42->34  Continue monitoring  Alcohol abuse  On CIWA protocol  On B1/FA/MVI  Seizure precautions  Hypertensive Emergency  BP as high as 237/138  Stable but on the higher end  Put on amlodipine 10mg  . gradually normalize in 5-7 days . Long-term BP goal normotensive  Hyperlipidemia  Home meds:  No statin  LDL 83, goal < 70  No statin for now given transaminitis  UTI  UA WBC  50  Leukocytosis resolved  Blood culture NGTD  On rocephine  Dysphagia   Decrease po intake  Not taking much po  Na 142->146  Continue IVF on 1/2NS  Tobacco abuse  Current smoker  Smoking cessation counseling will be provided  Other Stroke Risk Factors  Hx of drug abues. UDS positive for cocaine in 2013  Other Active Problems  leukocytopsis WBC 12.8->11.8->8.5  Hypokalemia - K 2.6 ->3.2 ->2.8 further supplement ordered  Hospital day # 7  Marvel PlanJindong Liv Rallis, MD PhD Stroke Neurology 07/30/2019 11:29 AM  To contact Stroke Continuity provider, please refer to [email or web address]. After hours, contact General Neurology

## 2019-07-30 NOTE — Progress Notes (Addendum)
PROGRESS NOTE    Tyler [name]  EHM:[id number] DOB: Feb 21, 1971 DOA: 07/23/2019 PCP: Patient, No Pcp Per   Brief Narrative:  48 year old with history of polysubstance abuse including heroin was initially brought to [facility] ER after being found down with a syringe nearby.  He was given Narcan with minimal improvement.  Found in severe rhabdomyolysis with acute kidney injury and hyperkalemia.  He was started on fluids including bicarb drip and transferred to [facility] for further care management.  Rhabdomyolysis improved but he had total investigation for metabolic encephalopathy and was found to have acute bilateral cerebellar infarct.   Assessment & Plan:   Principal Problem:   Rhabdomyolysis Active Problems:   Acute kidney failure (HCC)   Acute metabolic encephalopathy   Polysubstance abuse (HCC)   Hyperkalemia   Transaminitis   HTN (hypertension)   Cerebral embolism with cerebral infarction  Metabolic encephalopathy is multifactorial - Secondary to his drug use, uremia, heavy alcohol use, drug use, urinary tract infection.  Each issues addressed below.  Acute metabolic encephalopathy, hyperactive and delirious today. Acute bilateral cerebrovascular CVA, nonhemorrhagic -CT of the head and neck is negative for any traumatic finding.  UDS-negative.  Tylenol and salicylate levels-negative. Cont ASA -Ammonia still slightly high, lactulose twice daily p.o. or rectally ordered. -No focal neuro deficits,  -TSH, B12-within normal limits, folate- normal - MRI brain- acute CVA -EEG- generalized diffuse slowing but no seizure activity Continue aspirin, LDL 83, hemoglobin A1c 5.1 -Echocardiogram- done, results pending.  -Lower extremity Dopplers-negative for DVT -Neurology following.  Hyperglycemia, uncontrolled. -Stop D5 fluids, transition to half-normal saline. - 10 units NovoLog, insulin sliding scale.  Accu-Cheks.  Update 1225pm- lab drawn from where D5 fluid was running, will  discontinue can use of Lovenox.  Continue Accu-Chek and sliding scale.  If necessary will change fluids to D5 half-normal saline.  Severe rhabdomyolysis with acute kidney injury AKI, Improving. -CK= better.  - Bicarb improving, continue IV fluids. - Renal ultrasound-negative  Hypokalemia -3 doses of 40 mEq potassium today  Transaminitis trending down Elevated total bilirubin - AST> ALT, concern for alcoholic hepatitis.  Also LFTs trending down.  Total bilirubin improving.  Right upper quadrant ultrasound- hepatic steatosis with early cirrhosis.  Some gallbladder wall thickening  Alcohol use per history -Patient unable to tell me if he drinks alcohol daily or not.  We will place him on withdrawal protocol.  Leukocytosis, resolved Urinary tract infection, present on admission -Resolved.  Treated 5 days of Rocephin  Essential hypertension -Not on any home medications.  Lost IV access, we need IV access as its medically indicated for meds while he is AMS. Will order PICC>   DVT prophylaxis: Heparin Code Status: Full code Family Communication: Unable to reach them in last 3 days.  Disposition Plan: During hospital stay until his mentation is improved.  Consultants:   None  Procedures:   None  Antimicrobials:   None   Subjective: Patient is slightly more cooperative but still drowsy and does not participate much in meaningful conversation. Finally this morning he allowed for echocardiogram to be performed. He wakes up with physical stimulus but falls back asleep, does not participate in conversation much.  Review of Systems Otherwise negative except as per HPI, including: Difficult to obtain Objective: Vitals:   07/30/19 0429 07/30/19 0513 07/30/19 0600 07/30/19 0839  BP: (!) 177/101 (!) 158/99 (!) 150/100 (!) 185/93  Pulse:   95 94  Resp: _0 Temp: 97.8 F (36.6 C)  97.9 F (  36.6 C) 97.8 F (36.6 C)  TempSrc:   Oral Axillary  SpO2: 96%   98%  Weight:         Intake/Output Summary (Last 24 hours) at 07/30/2019 1048 Last data filed at 07/30/2019 0641 Gross per 24 hour  Intake 737.65 ml  Output 700 ml  Net 37.65 ml   Filed Weights   07/24/19 0624  Weight: 73 kg    Examination: Constitutional: Drowsy Eyes scleral icterus ENMT: Mucous membranes are dry posterior pharynx clear of any exudate or lesions.poor dentition Neck: normal, supple, no masses, no thyromegaly Respiratory: Minimal bibasilar rhonchi Cardiovascular: Regular rate and rhythm, no murmurs / rubs / gallops. No extremity edema. 2+ pedal pulses. No carotid bruits.  Abdomen: no tenderness, no masses palpated. No hepatosplenomegaly. Bowel sounds positive.  Musculoskeletal: no clubbing / cyanosis. No joint deformity upper and lower extremities. Good ROM, no contractures. Normal muscle tone.  Skin: no rashes, lesions, ulcers. No induration Neurologic: Difficult to assess but grossly moving all of his extremities Psychiatric: Difficult to assess   Data Reviewed:   CBC: Recent Labs  Lab 07/25/19 0356 07/26/19 0227 07/27/19 0331 07/28/19 0246 07/29/19 0403  WBC 12.8* 11.8* 8.8 8.5 9.5  HGB 13.6 13.8 13.8 12.2* 12.2*  HCT 41.4 42.0 42.4 37.9* 37.6*  MCV 87.3 87.5 89.3 89.4 88.7  PLT 217 208 189 171 768*   Basic Metabolic Panel: Recent Labs  Lab 07/26/19 0227 07/27/19 0331 07/28/19 0246 07/29/19 0403 07/30/19 0900  NA 142 145 141 142 146*  K 3.3* 3.7 2.6* 3.2* 2.8*  CL 96* 103 102 107 118*  CO2 _0 21*  GLUCOSE 99 85 139* 123* 565*  BUN 72* 76* 66* 48* 39*  CREATININE 5.79* 5.42* 4.18* 3.30* 2.36*  CALCIUM 8.1* 8.9 8.5* 8.7* 7.7*  MG 2.1 2.1 1.9 1.5* 1.6*   GFR: CrCl cannot be calculated (Unknown ideal weight.). Liver Function Tests: Recent Labs  Lab 07/26/19 0227 07/27/19 0331 07/28/19 0246 07/29/19 0403 07/30/19 0900  AST 710* 635* 392* 299* 129*  ALT 508* 433* 307* 268* 151*  ALKPHOS 118 100 99 95 65  BILITOT 3.5* 3.3* 4.0* 4.6*  3.6*  PROT 6.7 6.6 6.3* 6.6 5.4*  ALBUMIN 2.6* 2.9* 2.6* 2.9* 2.4*   No results for input(s): LIPASE, AMYLASE in the last 168 hours. Recent Labs  Lab 07/23/19 1947 07/25/19 0356 07/27/19 0331 07/30/19 0349  AMMONIA 55* 21 42* 34   Coagulation Profile: No results for input(s): INR, PROTIME in the last 168 hours. Cardiac Enzymes: Recent Labs  Lab 07/26/19 0227 07/27/19 0331 07/28/19 0246 07/29/19 0403 07/30/19 0349  CKTOTAL 21,050* 21,620* 7,230* 4,700* 1,650*   BNP (last 3 results) No results for input(s): PROBNP in the last 8760 hours. HbA1C: No results for input(s): HGBA1C in the last 72 hours. CBG: Recent Labs  Lab 07/29/19 0022 07/29/19 0754 07/29/19 1636 07/29/19 2248 07/30/19 0048  GLUCAP 107* 92 86 117* 110*   Lipid Profile: No results for input(s): CHOL, HDL, LDLCALC, TRIG, CHOLHDL, LDLDIRECT in the last 72 hours. Thyroid Function Tests: No results for input(s): TSH, T4TOTAL, FREET4, T3FREE, THYROIDAB in the last 72 hours. Anemia Panel: No results for input(s): VITAMINB12, FOLATE, FERRITIN, TIBC, IRON, RETICCTPCT in the last 72 hours. Sepsis Labs: Recent Labs  Lab 07/23/19 1947 07/23/19 1955 07/23/19 2300 07/24/19 0159 07/25/19 0356  PROCALCITON 13.58  --   --  0.11 5.64  LATICACIDVEN  --  1.8 1.9  --   --  Recent Results (from the past 240 hour(s))  SARS Coronavirus 2 [facility] order, Performed in [place] hospital lab)     Status: None   Collection Time: 07/23/19  9:57 PM  Result Value Ref Range Status   SARS Coronavirus 2 NEGATIVE NEGATIVE Final    Comment: (NOTE) If result is NEGATIVE SARS-CoV-2 target nucleic acids are NOT DETECTED. The SARS-CoV-2 RNA is generally detectable in upper and lower  respiratory specimens during the acute phase of infection. The lowest  concentration of SARS-CoV-2 viral copies this assay can detect is 250  copies / mL. A negative result does not preclude SARS-CoV-2 infection  and should not be used as  the sole basis for treatment or other  patient management decisions.  A negative result may occur with  improper specimen collection / handling, submission of specimen other  than nasopharyngeal swab, presence of viral mutation(s) within the  areas targeted by this assay, and inadequate number of viral copies  (<250 copies / mL). A negative result must be combined with clinical  observations, patient history, and epidemiological information. If result is POSITIVE SARS-CoV-2 target nucleic acids are DETECTED. The SARS-CoV-2 RNA is generally detectable in upper and lower  respiratory specimens dur ing the acute phase of infection.  Positive  results are indicative of active infection with SARS-CoV-2.  Clinical  correlation with patient history and other diagnostic information is  necessary to determine patient infection status.  Positive results do  not rule out bacterial infection or co-infection with other viruses. If result is PRESUMPTIVE POSTIVE SARS-CoV-2 nucleic acids MAY BE PRESENT.   A presumptive positive result was obtained on the submitted specimen  and confirmed on repeat testing.  While 2019 novel coronavirus  (SARS-CoV-2) nucleic acids may be present in the submitted sample  additional confirmatory testing may be necessary for epidemiological  and / or clinical management purposes  to differentiate between  SARS-CoV-2 and other Sarbecovirus currently known to infect humans.  If clinically indicated additional testing with an alternate test  methodology [phone]) is advised. The SARS-CoV-2 RNA is generally  detectable in upper and lower respiratory sp ecimens during the acute  phase of infection. The expected result is Negative. Fact Sheet for Patients:  StrictlyIdeas.no Fact Sheet for Healthcare Providers: BankingDealers.co.za This test is not yet approved or cleared by the [place] FDA and has been authorized for  detection and/or diagnosis of SARS-CoV-2 by FDA under an Emergency Use Authorization (EUA).  This EUA will remain in effect (meaning this test can be used) for the duration of the COVID-19 declaration under Section 564(b)(1) of the Act, 21 U.S.C. section 360bbb-3(b)(1), unless the authorization is terminated or revoked sooner. Performed at [facility], [place] [address], [place], [place] [address]   Culture, blood (routine x 2)     Status: None   Collection Time: 07/23/19 10:41 PM   Specimen: BLOOD LEFT HAND  Result Value Ref Range Status   Specimen Description BLOOD LEFT HAND  Final   Special Requests   Final    AEROBIC BOTTLE ONLY Blood Culture results may not be optimal due to an inadequate volume of blood received in culture bottles   Culture   Final    NO GROWTH 5 DAYS Performed at [facility], [place] [address], [place],  [address]    Report Status 07/29/2019 FINAL  Final  Culture, blood (routine x 2)     Status: None   Collection Time: 07/23/19 10:50 PM  Specimen: BLOOD  Result Value Ref Range Status   Specimen Description BLOOD LEFT ANTECUBITAL  Final   Special Requests   Final    AEROBIC BOTTLE ONLY Blood Culture results may not be optimal due to an inadequate volume of blood received in culture bottles   Culture   Final    NO GROWTH 5 DAYS Performed at [place] [address] [place], [place] [address]    Report Status 07/29/2019 FINAL  Final  MRSA PCR Screening     Status: None   Collection Time: 07/24/19  2:10 AM   Specimen: Nasal Mucosa; Nasopharyngeal  Result Value Ref Range Status   MRSA by PCR NEGATIVE NEGATIVE Final    Comment: Performed at [facility], [place] [address], [place], [place] [address]  Culture, Urine     Status: Abnormal   Collection Time: 07/25/19  1:39 PM   Specimen: Urine, Random  Result Value Ref Range Status   Specimen Description URINE, RANDOM  Final   Special Requests   Final    NONE Performed at [facility], [place] [address], [place], [place] [address]    Culture MULTIPLE SPECIES PRESENT, SUGGEST RECOLLECTION (A)  Final   Report Status 07/26/2019 FINAL  Final  Culture, blood (Routine X 2) w Reflex to ID Panel     Status: None (Preliminary result)   Collection Time: 07/28/19 12:11 PM   Specimen: BLOOD LEFT ARM  Result Value Ref Range Status   Specimen Description BLOOD LEFT ARM  Final   Special Requests   Final    BOTTLES DRAWN AEROBIC AND ANAEROBIC Blood Culture adequate volume   Culture   Final    NO GROWTH 2 DAYS Performed at [facility], [place] [address] [place], [place] [address]    Report Status PENDING  Incomplete  Culture, blood (Routine X 2) w Reflex to ID Panel     Status: None (Preliminary result)   Collection Time: 07/28/19 12:12 PM   Specimen: BLOOD LEFT ARM  Result Value Ref Range Status   Specimen Description BLOOD LEFT ARM  Final   Special Requests   Final    BOTTLES DRAWN AEROBIC ONLY Blood Culture adequate volume   Culture   Final    NO GROWTH 2 DAYS Performed at [facility], [place] [address] [place], [place] [address]    Report Status PENDING  Incomplete         Radiology Studies: Vas [place] Lower Extremity Venous (dvt)  Result Date: 07/29/2019  Lower Venous Study Indications: Embolic stroke.  Comparison Study: No previous study available for comparison Performing Technologist: Toma Copier RVS  Examination Guidelines: A complete evaluation includes B-mode imaging, spectral Doppler, color Doppler, and power Doppler as needed of all accessible portions of each vessel. Bilateral testing is considered an integral part of a complete examination. Limited examinations for reoccurring indications may be performed as noted.  +---------+---------------+---------+-----------+----------+--------------+ RIGHT    CompressibilityPhasicitySpontaneityPropertiesThrombus Aging +---------+---------------+---------+-----------+----------+--------------+  CFV      Full           Yes      Yes                                 +---------+---------------+---------+-----------+----------+--------------+ SFJ      Full                                                        +---------+---------------+---------+-----------+----------+--------------+  FV Prox  Full           Yes      Yes                                 +---------+---------------+---------+-----------+----------+--------------+ FV Mid   Full                                                        +---------+---------------+---------+-----------+----------+--------------+ FV DistalFull           Yes      Yes                                 +---------+---------------+---------+-----------+----------+--------------+ PFV      Full           Yes      Yes                                 +---------+---------------+---------+-----------+----------+--------------+ POP      Full           Yes      Yes                                 +---------+---------------+---------+-----------+----------+--------------+ PTV      Full                                                        +---------+---------------+---------+-----------+----------+--------------+ PERO     Full                                                        +---------+---------------+---------+-----------+----------+--------------+   Right Technical Findings: Technically limited due to constant movement of the patient even with RN assistance.  +---------+---------------+---------+-----------+----------+--------------+ LEFT     CompressibilityPhasicitySpontaneityPropertiesThrombus Aging +---------+---------------+---------+-----------+----------+--------------+ CFV      Full           Yes      Yes                                 +---------+---------------+---------+-----------+----------+--------------+ SFJ      Full                                                         +---------+---------------+---------+-----------+----------+--------------+ FV Prox  Full           Yes      Yes                                 +---------+---------------+---------+-----------+----------+--------------+  FV Mid   Full                                                        +---------+---------------+---------+-----------+----------+--------------+ FV DistalFull           Yes      Yes                                 +---------+---------------+---------+-----------+----------+--------------+ PFV      Full           Yes      Yes                                 +---------+---------------+---------+-----------+----------+--------------+ POP      Full           Yes      Yes                                 +---------+---------------+---------+-----------+----------+--------------+ PTV      Full                                                        +---------+---------------+---------+-----------+----------+--------------+ PERO     Full                                                        +---------+---------------+---------+-----------+----------+--------------+   Left Technical Findings: Technically limited due to constant movement even with RN assistance   Summary: Right: There is no evidence of deep vein thrombosis in the lower extremity. However, portions of this examination were limited- see technologist comments above. No cystic structure found in the popliteal fossa. Left: There is no evidence of deep vein thrombosis in the lower extremity. However, portions of this examination were limited- see technologist comments above. No cystic structure found in the popliteal fossa.  *See table(s) above for measurements and observations. Electronically signed by Monica Martinez MD on 07/29/2019 at 2:33:31 PM.    Final    [place] Ekg Site Rite  Result Date: 07/29/2019 If [facility] image not attached, placement could not be confirmed due to current cardiac  rhythm.       Scheduled Meds: .  stroke: mapping our early stages of recovery book   Does not apply Once  . aspirin  300 mg Rectal Daily   Or  . aspirin  325 mg Oral Daily  . chlorhexidine gluconate (MEDLINE KIT)  15 mL Mouth Rinse BID  . cyanocobalamin  1,000 mcg Intramuscular Q30 days  . folic acid  1 mg Intravenous Daily  . heparin  5,000 Units Subcutaneous Q8H  . insulin aspart  0-15 Units Subcutaneous TID WC  . insulin aspart  0-5 Units Subcutaneous QHS  . insulin aspart  10 Units Subcutaneous Once  . lactulose  20 g  Oral BID   Or  . lactulose  300 mL Rectal BID  . mouth rinse  15 mL Mouth Rinse 10 times per day  . multivitamin  15 mL Oral Daily  . potassium chloride  40 mEq Oral TID  . thiamine  100 mg Oral Daily   Or  . thiamine  100 mg Intravenous Daily   Continuous Infusions: . sodium chloride       LOS: 7 days   Time spent= 35 mins    Melis Trochez Arsenio Loader, MD [facility]  If 7PM-7AM, please contact night-coverage [email or web address] 07/30/2019, 10:48 AM

## 2019-07-30 NOTE — Progress Notes (Signed)
Repeat glucose drawn from picc line and sent to lab

## 2019-07-30 NOTE — Progress Notes (Signed)
PT Cancellation Note  Patient Details Name: Tyler [name] MRN: [id number] DOB: 06-02-1971   Cancelled Treatment:    Reason Eval/Treat Not Completed: Medical issues which prohibited therapy. Pt with elevated CBG and RN reporting pt not appropriate to work with PT. Will hold and follow up as schedule allows.  Christophe Louis, SPT  Christophe Louis 07/30/2019, 11:24 AM

## 2019-07-30 NOTE — Plan of Care (Signed)
  Problem: Education: Goal: Knowledge of General Education information will improve Description: Including pain rating scale, medication(s)/side effects and non-pharmacologic comfort measures Outcome: Not Progressing   Problem: Health Behavior/Discharge Planning: Goal: Ability to manage health-related needs will improve Outcome: Not Progressing   Problem: Clinical Measurements: Goal: Ability to maintain clinical measurements within normal limits will improve Outcome: Not Progressing Goal: Will remain free from infection Outcome: Not Progressing Goal: Diagnostic test results will improve Outcome: Not Progressing Goal: Respiratory complications will improve Outcome: Not Progressing Goal: Cardiovascular complication will be avoided Outcome: Not Progressing   Problem: Activity: Goal: Risk for activity intolerance will decrease Outcome: Not Progressing   Problem: Nutrition: Goal: Adequate nutrition will be maintained Outcome: Not Progressing   Problem: Coping: Goal: Level of anxiety will decrease Outcome: Not Progressing   Problem: Elimination: Goal: Will not experience complications related to bowel motility Outcome: Not Progressing Goal: Will not experience complications related to urinary retention Outcome: Not Progressing   Problem: Pain Managment: Goal: General experience of comfort will improve Outcome: Not Progressing   Problem: Safety: Goal: Ability to remain free from injury will improve Outcome: Not Progressing   Problem: Skin Integrity: Goal: Risk for impaired skin integrity will decrease Outcome: Not Progressing   Problem: Education: Goal: Knowledge of General Education information will improve Description: Including pain rating scale, medication(s)/side effects and non-pharmacologic comfort measures Outcome: Not Progressing   Problem: Health Behavior/Discharge Planning: Goal: Ability to manage health-related needs will improve Outcome: Not  Progressing   Problem: Clinical Measurements: Goal: Ability to maintain clinical measurements within normal limits will improve Outcome: Not Progressing Goal: Will remain free from infection Outcome: Not Progressing Goal: Diagnostic test results will improve Outcome: Not Progressing Goal: Respiratory complications will improve Outcome: Not Progressing Goal: Cardiovascular complication will be avoided Outcome: Not Progressing   Problem: Nutrition: Goal: Adequate nutrition will be maintained Outcome: Not Progressing   Problem: Activity: Goal: Risk for activity intolerance will decrease Outcome: Not Progressing   Problem: Coping: Goal: Level of anxiety will decrease Outcome: Not Progressing   Problem: Elimination: Goal: Will not experience complications related to bowel motility Outcome: Not Progressing Goal: Will not experience complications related to urinary retention Outcome: Not Progressing   Problem: Pain Managment: Goal: General experience of comfort will improve Outcome: Not Progressing   Problem: Safety: Goal: Ability to remain free from injury will improve Outcome: Not Progressing   Problem: Education: Goal: Knowledge of disease or condition will improve Outcome: Not Progressing Goal: Knowledge of secondary prevention will improve Outcome: Not Progressing Goal: Knowledge of patient specific risk factors addressed and post discharge goals established will improve Outcome: Not Progressing Goal: Individualized Educational Video(s) Outcome: Not Progressing   Problem: Coping: Goal: Will verbalize positive feelings about self Outcome: Not Progressing Goal: Will identify appropriate support needs Outcome: Not Progressing   Problem: Health Behavior/Discharge Planning: Goal: Ability to manage health-related needs will improve Outcome: Not Progressing   Problem: Self-Care: Goal: Ability to participate in self-care as condition permits will improve Outcome:  Not Progressing Goal: Verbalization of feelings and concerns over difficulty with self-care will improve Outcome: Not Progressing Goal: Ability to communicate needs accurately will improve Outcome: Not Progressing   Problem: Nutrition: Goal: Risk of aspiration will decrease Outcome: Not Progressing Goal: Dietary intake will improve Outcome: Not Progressing   Problem: Ischemic Stroke/TIA Tissue Perfusion: Goal: Complications of ischemic stroke/TIA will be minimized Outcome: Not Progressing

## 2019-07-30 NOTE — Progress Notes (Signed)
  Echocardiogram 2D Echocardiogram has been performed.  Jennette Dubin 07/30/2019, 10:27 AM

## 2019-07-31 DIAGNOSIS — R401 Stupor: Secondary | ICD-10-CM

## 2019-07-31 LAB — GLUCOSE, CAPILLARY
Glucose-Capillary: 87 mg/dL (ref 70–99)
Glucose-Capillary: 107 mg/dL — ABNORMAL HIGH (ref 70–99)
Glucose-Capillary: 151 mg/dL — ABNORMAL HIGH (ref 70–99)
Glucose-Capillary: 85 mg/dL (ref 70–99)
Glucose-Capillary: 94 mg/dL (ref 70–99)
Glucose-Capillary: 94 mg/dL (ref 70–99)
Glucose-Capillary: 87 mg/dL (ref 70–99)

## 2019-07-31 LAB — COMPREHENSIVE METABOLIC PANEL
ALT: 140 U/L — ABNORMAL HIGH (ref 0–44)
AST: 115 U/L — ABNORMAL HIGH (ref 15–41)
Albumin: 2.6 g/dL — ABNORMAL LOW (ref 3.5–5.0)
Alkaline Phosphatase: 74 U/L (ref 38–126)
Anion gap: 7 (ref 5–15)
BUN: 46 mg/dL — ABNORMAL HIGH (ref 6–20)
CO2: 23 mmol/L (ref 22–32)
Calcium: 8.9 mg/dL (ref 8.9–10.3)
Chloride: 115 mmol/L — ABNORMAL HIGH (ref 98–111)
Creatinine, Ser: 2.59 mg/dL — ABNORMAL HIGH (ref 0.61–1.24)
GFR calc Af Amer: 32 mL/min — ABNORMAL LOW (ref >60)
GFR calc non Af Amer: 28 mL/min — ABNORMAL LOW (ref >60)
Glucose, Bld: 116 mg/dL — ABNORMAL HIGH (ref 70–99)
Potassium: 4 mmol/L (ref 3.5–5.1)
Sodium: 145 mmol/L (ref 135–145)
Total Bilirubin: 3.6 mg/dL — ABNORMAL HIGH (ref 0.3–1.2)
Total Protein: 6.1 g/dL — ABNORMAL LOW (ref 6.5–8.1)

## 2019-07-31 LAB — MAGNESIUM: Magnesium: 2.3 mg/dL (ref 1.7–2.4)

## 2019-07-31 LAB — CK: Total CK: 543 U/L — ABNORMAL HIGH (ref 49–397)

## 2019-07-31 MED ORDER — METOPROLOL TARTRATE 25 MG PO TABS
25.0000 mg | ORAL_TABLET | Freq: Two times a day (BID) | ORAL | Status: DC
  Administered 2019-07-31 – 2019-08-01 (×2): 25 mg via ORAL
  Filled 2019-07-31 (×4): qty 1

## 2019-07-31 NOTE — Progress Notes (Signed)
Physical Therapy Treatment Patient Details Name: Tyler [name] MRN: [id number] DOB: [birth year] Today's Date: 07/31/2019    History of Present Illness Pt is a 48 y/o male transferred from [place] for unresponsiveness, likely secondary to substance abuse. Pt with rhabdomyolysis and AKI. EEG revealed moderate diffuse encephalopathy. PMH includes polysubstance abuse and HTN.     PT Comments    Patient seen for mobility progression. Pt is making gradual progress toward PT goals and more alert and participatory this session. Pt requires +2 assist for bed mobility and functional transfer training. Continue to progress as tolerated with anticipated d/c to SNF for further skilled PT services.     Follow Up Recommendations  SNF;Supervision/Assistance - 24 hour     Equipment Recommendations  Other (comment)(TBD)    Recommendations for Other Services       Precautions / Restrictions Precautions Precautions: Fall Restrictions Weight Bearing Restrictions: No    Mobility  Bed Mobility Overal bed mobility: Needs Assistance Bed Mobility: Supine to Sit;Rolling Rolling: Mod assist   Supine to sit: Max assist;+2 for physical assistance;+2 for safety/equipment     General bed mobility comments: multimodal cues and use of bed pad needed; pt with limited engagement  Transfers Overall transfer level: Needs assistance Equipment used: None Transfers: Sit to/from Stand Sit to Stand: Total assist;+2 physical assistance;+2 safety/equipment         General transfer comment: overall total A +2, did not achieve full standing position  Ambulation/Gait                 Stairs             Wheelchair Mobility    Modified Rankin (Stroke Patients Only)       Balance Overall balance assessment: Needs assistance Sitting-balance support: Bilateral upper extremity supported;Feet supported Sitting balance-Leahy Scale: Poor Sitting balance - Comments: requiring support,  no protective reactions noted with loss of sitting balance   Standing balance support: Bilateral upper extremity supported Standing balance-Leahy Scale: Zero Standing balance comment: total A to achieve partial stand                            Cognition Arousal/Alertness: Lethargic Behavior During Therapy: Flat affect Overall Cognitive Status: No family/caregiver present to determine baseline cognitive functioning Area of Impairment: Attention;Awareness;Following commands                   Current Attention Level: Focused   Following Commands: Follows one step commands inconsistently   Awareness: Intellectual   General Comments: pt very slow to respond, not responding often at all during session despite eyes open. Pt did not track OT in room consistently despite multimodal cues. Pt did verbalize at one point in session, not comprehensible. he did shake his head yes/no to very few commands with increased processing time      Exercises      General Comments        Pertinent Vitals/Pain Pain Assessment: No/denies pain Pain Location: shook head no to pain    Home Living                      Prior Function            PT Goals (current goals can now be found in the care plan section) Acute Rehab PT Goals Patient Stated Goal: none stated Progress towards PT goals: Progressing toward goals    Frequency  Min 2X/week      PT Plan Current plan remains appropriate    Co-evaluation PT/OT/SLP Co-Evaluation/Treatment: Yes Reason for Co-Treatment: Complexity of the patient's impairments (multi-system involvement);Necessary to address cognition/behavior during functional activity;For patient/therapist safety;To address functional/ADL transfers PT goals addressed during session: Mobility/safety with mobility;Balance;Strengthening/ROM OT goals addressed during session: ADL's and self-care;Strengthening/ROM      AM-PAC PT "6 Clicks" Mobility    Outcome Measure  Help needed turning from your back to your side while in a flat bed without using bedrails?: Total Help needed moving from lying on your back to sitting on the side of a flat bed without using bedrails?: Total Help needed moving to and from a bed to a chair (including a wheelchair)?: Total Help needed standing up from a chair using your arms (e.g., wheelchair or bedside chair)?: Total Help needed to walk in hospital room?: Total Help needed climbing 3-5 steps with a railing? : Total 6 Click Score: 6    End of Session Equipment Utilized During Treatment: Gait belt Activity Tolerance: Patient tolerated treatment well Patient left: in bed;with call bell/phone within reach;with bed alarm set;with restraints reapplied(bilat wrist restraints) Nurse Communication: Mobility status PT Visit Diagnosis: Other abnormalities of gait and mobility (R26.89);Difficulty in walking, not elsewhere classified (R26.2)     Time: [id number] PT Time Calculation (min) (ACUTE ONLY): 44 min  Charges:  $Gait Training: 8-22 mins                     Erline LevineKellyn Isadora Delorey, PTA Acute Rehabilitation Services Pager: [phone] Office: [phone]     Tyler [name] 07/31/2019, 5:19 PM

## 2019-07-31 NOTE — Plan of Care (Signed)

## 2019-07-31 NOTE — Progress Notes (Signed)
MEWS/VS Documentation      07/31/2019 0800 07/31/2019 0807 07/31/2019 1216 07/31/2019 1529   MEWS Score:  3  2  2  2    MEWS Score Color:  Yellow  Yellow  Yellow  Yellow   Pulse:  -  97  77  -   BP:  -  (!) 183/119  (!) 170/106  (!) 140/100   Temp:  -  97.9 F (36.6 C)  97.8 F (36.6 C)  98.7 F (37.1 C)   O2 Device:  -  Room Air  Room Air  Room Air   Level of Consciousness:  Responds to Voice  -  -  -     No acute change noted to patients condition at this time

## 2019-07-31 NOTE — Progress Notes (Signed)
STROKE TEAM PROGRESS NOTE   INTERVAL HISTORY Pt more awake alert than yesterday, eyes open spontaneously, not follow commands, mumbling words, still on 2-point restrain at both hands. Cre 2.59, will increase IVF.    Vitals:   07/31/19 0035 07/31/19 0412 07/31/19 0457 07/31/19 0807  BP: (!) 194/93 (!) 180/121 (!) 147/80 (!) 183/119  Pulse: 94 86 (!) 105 97  Resp: 12 (!) 22    Temp: 98 F (36.7 C) 98.1 F (36.7 C)  97.9 F (36.6 C)  TempSrc: Axillary Oral  Oral  SpO2: 100% 98% 99% 100%  Weight:        CBC:  Recent Labs  Lab 07/28/19 0246 07/29/19 0403  WBC 8.5 9.5  HGB 12.2* 12.2*  HCT 37.9* 37.6*  MCV 89.4 88.7  PLT 171 148*    Basic Metabolic Panel:  Recent Labs  Lab 07/30/19 0900 07/30/19 1101 07/31/19 0314  NA 146*  --  145  K 2.8*  --  4.0  CL 118*  --  115*  CO2 21*  --  23  GLUCOSE 565* 102* 116*  BUN 39*  --  46*  CREATININE 2.36*  --  2.59*  CALCIUM 7.7*  --  8.9  MG 1.6*  --  2.3   Lipid Panel:     Component Value Date/Time   CHOL 150 07/27/2019 0331   TRIG 239 (H) 07/27/2019 0331   HDL 19 (L) 07/27/2019 0331   CHOLHDL 7.9 07/27/2019 0331   VLDL 48 (H) 07/27/2019 0331   LDLCALC 83 07/27/2019 0331   HgbA1c:  Lab Results  Component Value Date   HGBA1C 5.1 07/27/2019   Urine Drug Screen:     Component Value Date/Time   LABOPIA NONE DETECTED 07/28/2019 0951   COCAINSCRNUR NONE DETECTED 07/28/2019 0951   LABBENZ POSITIVE (A) 07/28/2019 0951   AMPHETMU POSITIVE (A) 07/28/2019 0951   THCU NONE DETECTED 07/28/2019 0951   LABBARB NONE DETECTED 07/28/2019 0951    Alcohol Level     Component Value Date/Time   ETH 127 (H) 04/20/2012 1839    IMAGING  [facility] [place] [facility] 07/27/2019 Summary:  Right Carotid: Velocities in the right ICA are consistent with a 1-39% stenosis.  Left Carotid: Velocities in the left ICA are consistent with a 1-39% stenosis.  Vertebrals:  Bilateral vertebral arteries demonstrate antegrade flow.   Subclavians: Normal flow hemodynamics were seen in bilateral subclavian arteries.    PHYSICAL EXAM Frail malnourished looking middle-aged Caucasian male not in distress but mildly agitated with stimulation. Afebrile. Head is nontraumatic. Neck is supple without bruit.  Cardiac exam no murmur or gallop. Lungs are clear to auscultation. Distal pulses are well felt.  Neurological Exam :  Patient more awake alert than yesterday, eyes open spontaneously, but is still not following commands, not cooperative on exam. Mumbling words not able to be understood. Not blinking to visual threat, limited tracking bilaterally. PERRL. There is no obvious facial weakness.  Tongue midline in mouth.  He is able to move all 4 extremities spontaneously against gravity and there does not appear to be focal weakness. Sensation, coordination and gait not tested.   ASSESSMENT/PLAN Tyler [name] is a 48 y.o. male with history of alcohol and drug use found unresponsive with rhabdo and AKI. MRI done for altered mental status shows multifocal patchy infarcts throughout the brain. [facility]   Stroke: Patchy B anterior and posterior infarcts embolic, embolic pattern, could be due to severe dehydration  AKI in the setting of diffuse intracranial stenosis. However, cardioembolic source cannot rule out such as afib or endocarditis   Resultant - pt still agitation on restrain, not following commands  MRI  Patchy acute B cerebral and cerebellar infarcts. R temporal lobe encephalomalacia and chronic cerebral white matter disease   Consider MRA head or CTA head/neck if Cre normalizes once stable and cooperative  Carotid Doppler unremarkable  2D Echo EF 60-65%  LE venous doppler no DVT  EEG no sz, diffuse encephalopathy  May consider 30 day cardiac event monitoring as outpt to rule out afib if above work up unrevealing and pt neuro improves.   LDL 83  HgbA1c 5.1  UDS - positive for  amphetamines and benzodiazepine  Blood culture NGTD  Heparin 5000 units sq tid for VTE prophylaxis  No antithrombotic prior to admission, now on aspirin 325 mg daily or ASA 300 PR.   Therapy recommendations:  SNF  Disposition:  pending   Metabolic encephalopathy  Likely due to rhabodo, AKI, elevated CK, LFTs and ammonia as well as alcohol withdraw  Treatment per primary team  EEG no seizure  Continue current manatement  Rhabdomyolysis AKI Elevated AST/ALT  Found down at home   Cre 5.79->5.42->4.18->3.30->2.36->2.59  Not eating or drinking much due to mental status  Continue IVF, increase IVF to 100cc/h  CK and AST/ALT continues to improve  Continue monitoring   Elevated ammonia  On lactulose  Ammonia 55->21->42->34  Continue monitoring  Alcohol abuse  On CIWA protocol  On B1/FA/MVI  Seizure precautions  Hypertensive Emergency  BP as high as 237/138  Stable but on the higher end  Continue amlodipine 10mg   Add metoprolol 25mg  bid . gradually normalize in 5-7 days . Long-term BP goal normotensive  Hyperlipidemia  Home meds:  No statin  LDL 83, goal < 70  No statin for now given transaminitis  UTI  UA WBC  50  Leukocytosis resolved  Blood culture NGTD  Off rocephine  Dysphagia   Decrease po intake  Not taking much po  Na 142->146->145  Continue IVF on 1/2NS @ 100  Tobacco abuse  Current smoker  Smoking cessation counseling will be provided  Other Stroke Risk Factors  Hx of drug abues. UDS positive for cocaine in 2013  Other Active Problems  leukocytopsis WBC 12.8->11.8->8.5->9.5  Hypokalemia - K 2.6 ->3.2 ->2.8->4.0  Hospital day # 8  Rosalin Hawking, MD PhD Stroke Neurology 07/31/2019 10:10 AM  To contact Stroke Continuity provider, please refer to [email or web address] After hours, contact General Neurology

## 2019-07-31 NOTE — Progress Notes (Signed)
MEWS/VS Documentation      07/31/2019 0412 07/31/2019 0457 07/31/2019 0700 07/31/2019 0807   MEWS Score:  2  3  3  2    MEWS Score Color:  Yellow  Yellow  Yellow  Yellow   Resp:  (!) 22  -  -  -   Pulse:  86  (!) 105  -  97   BP:  (!) 180/121  (!) 147/80  -  (!) 183/119   Temp:  98.1 F (36.7 C)  -  -  97.9 F (36.6 C)   O2 Device:  Room Air  Room Air  -  Room Air    No change in patients condition at this time

## 2019-07-31 NOTE — Progress Notes (Signed)
Occupational Therapy Treatment Patient Details Name: Tyler [name] MRN: [id number] DOB: 1971/11/09 Today's Date: 07/31/2019    History of present illness Pt is a 48 y/o male transferred from [place] for unresponsiveness, likely secondary to substance abuse. Pt with rhabdomyolysis and AKI. EEG revealed moderate diffuse encephalopathy. PMH includes polysubstance abuse and HTN.    OT comments  Pt progressing toward stated goals, more alert this session than previous per chart review. Focused session on mobility progression this date. Pt very slow to respond, increased processing time, not verbalizing basic needs consistently. Pt does not blink to startle response near eyes and inconsistently tracked OT during session. Pt is overall max A +2 for bed mobility. While sitting EOB, pt drank some water and began to cough. SLP consult requested to RN. Offered food, pt shaking head no. Pt was max A to engage in grooming task with max A and multimodal cueing. He attempted to verbalize x1 in session but it was incomprehensible. Pt overall limited 2/2 severe cognitive deficits and overal arousal/awareness level. At this time continue to recommend SNF level of care. Will continue to follow acutely.    Follow Up Recommendations  SNF;Supervision/Assistance - 24 hour    Equipment Recommendations  None recommended by OT    Recommendations for Other Services Speech consult    Precautions / Restrictions Precautions Precautions: Fall Restrictions Weight Bearing Restrictions: No       Mobility Bed Mobility Overal bed mobility: Needs Assistance Bed Mobility: Supine to Sit;Rolling Rolling: Mod assist   Supine to sit: Max assist;+2 for physical assistance;+2 for safety/equipment     General bed mobility comments: multimodal cues and use of bed pad needed; pt with limited engagement  Transfers Overall transfer level: Needs assistance Equipment used: None Transfers: Sit to/from Stand Sit to  Stand: Total assist;+2 physical assistance;+2 safety/equipment         General transfer comment: overall total A +2, did not achieve full standing position    Balance Overall balance assessment: Needs assistance Sitting-balance support: Bilateral upper extremity supported;Feet supported Sitting balance-Leahy Scale: Poor Sitting balance - Comments: requiring support, no protective reactions noted with loss of sitting balance   Standing balance support: Bilateral upper extremity supported Standing balance-Leahy Scale: Zero Standing balance comment: total A to achieve partial stand                           ADL either performed or assessed with clinical judgement   ADL Overall ADL's : Needs assistance/impaired Eating/Feeding: Minimal assistance;Moderate assistance;Sitting Eating/Feeding Details (indicate cue type and reason): min- mod A, will drop utensil/cup without guarding; noted coughing after drinking liquids- RN aware for SLP eval Grooming: Maximal assistance;Sitting;Cueing for sequencing;Cueing for safety Grooming Details (indicate cue type and reason): sitting EOB, cues to initiate tasks, increased processing time; ultimately hand over hand at max A needed to complete task                               General ADL Comments: PT largely limitied 2/2 to cognitive deficits and generalized weakness     Vision   Vision Assessment?: Vision impaired- to be further tested in functional context Additional Comments: continue to assess, pt does not respond to automatic reactions such as blinking near eyes; does not track OT in room consistently   Perception     Praxis      Cognition Arousal/Alertness: Lethargic Behavior During  Therapy: Flat affect Overall Cognitive Status: No family/caregiver present to determine baseline cognitive functioning Area of Impairment: Attention;Awareness;Following commands                   Current Attention Level:  Focused   Following Commands: Follows one step commands inconsistently   Awareness: Intellectual   General Comments: pt very slow to respond, not responding often at all during session despite eyes open. Pt did not track OT in room consistently despite multimodal cues. Pt did verbalize at one point in session, not comprehensible. he did shake his head yes/no to very few commands with increased processing time        Exercises     Shoulder Instructions       General Comments      Pertinent Vitals/ Pain       Pain Assessment: No/denies pain Pain Location: shook head no to pain  Home Living                                          Prior Functioning/Environment              Frequency  Min 2X/week        Progress Toward Goals  OT Goals(current goals can now be found in the care plan section)  Progress towards OT goals: Progressing toward goals  Acute Rehab OT Goals Patient Stated Goal: none stated Time For Goal Achievement: 08/02/19 Potential to Achieve Goals: Good  Plan Discharge plan remains appropriate;Frequency remains appropriate    Co-evaluation    PT/OT/SLP Co-Evaluation/Treatment: Yes Reason for Co-Treatment: Complexity of the patient's impairments (multi-system involvement);Necessary to address cognition/behavior during functional activity;For patient/therapist safety;To address functional/ADL transfers PT goals addressed during session: Mobility/safety with mobility;Strengthening/ROM;Balance OT goals addressed during session: ADL's and self-care;Strengthening/ROM      AM-PAC OT "6 Clicks" Daily Activity     Outcome Measure   Help from another person eating meals?: A Lot Help from another person taking care of personal grooming?: A Lot Help from another person toileting, which includes using toliet, bedpan, or urinal?: Total Help from another person bathing (including washing, rinsing, drying)?: Total Help from another person to  put on and taking off regular upper body clothing?: Total Help from another person to put on and taking off regular lower body clothing?: Total 6 Click Score: 8    End of Session Equipment Utilized During Treatment: Gait belt  OT Visit Diagnosis: Other abnormalities of gait and mobility (R26.89);Other symptoms and signs involving cognitive function;Other symptoms and signs involving the nervous system (R29.898);Muscle weakness (generalized) (M62.81)   Activity Tolerance Patient limited by lethargy   Patient Left in bed;with call bell/phone within reach;with restraints reapplied;with bed alarm set   Nurse Communication Mobility status        Time: 1350-1434 OT Time Calculation (min): 44 min  Charges: OT General Charges $OT Visit: 1 Visit OT Treatments $Self Care/Home Management : 23-37 mins  Dalphine HandingKaylee Yanky [name], MSOT, OTR/L Behavioral Health OT/ Acute Relief OT [facility] Office: [phone]  Dalphine HandingKaylee Aquilla Shambley 07/31/2019, 4:08 PM

## 2019-07-31 NOTE — Progress Notes (Signed)
PROGRESS NOTE    Paden Senger  WNI:[id number] DOB: 14-Oct-1971 DOA: 07/23/2019 PCP: Patient, No Pcp Per   Brief Narrative:  48 year old with history of polysubstance abuse including heroin was initially brought to [facility] ER after being found down with a syringe nearby.  He was given Narcan with minimal improvement.  Found in severe rhabdomyolysis with acute kidney injury and hyperkalemia.  He was started on fluids including bicarb drip and transferred to [facility] for further care management.  Rhabdomyolysis improved but he had total investigation for metabolic encephalopathy and was found to have acute bilateral cerebellar infarct.  Neurology team has been following.  Mentally he is very slowly improving.  Awaiting renal function to improve so we can perform CTA head and neck, if fails to improve plan is to get MRA head and neck.   Assessment & Plan:   Principal Problem:   Rhabdomyolysis Active Problems:   Acute kidney failure (HCC)   Acute metabolic encephalopathy   Polysubstance abuse (HCC)   Hyperkalemia   Transaminitis   HTN (hypertension)   Cerebral embolism with cerebral infarction  Metabolic encephalopathy is multifactorial, very slow to improve - Secondary to his drug use, uremia, heavy alcohol use, drug use, urinary tract infection.  Each issues addressed below.  Acute metabolic encephalopathy, very slow to improve Acute bilateral cerebrovascular CVA, nonhemorrhagic -CT of the head and neck is negative for any traumatic finding.  UDS-negative.  Tylenol and salicylate levels-negative. Cont ASA -If mental status decline, recheck ammonia level.  On lactulose. -No focal neuro deficits,  -TSH, B12-within normal limits, folate- normal - MRI brain- acute CVA -EEG- generalized diffuse slowing but no seizure activity Continue aspirin, LDL 83, hemoglobin A1c 5.1 -Echocardiogram- ejection fraction 60 to 65% -Lower extremity Dopplers-negative for DVT -Neurology following.  Ideally will need CTA head and neck once renal function improves, if fails to improve perform MRA head and neck  Severe rhabdomyolysis with acute kidney injury AKI, Improving. -Significantly improved.  Renal ultrasound is negative -Creatinine has plateaued around 2.5.  Continue to monitor this.  Question if this is a new baseline.  Continues to make urine.  Monitor electrolytes closely.  Hypokalemia -Resolved  Transaminitis trending down Elevated total bilirubin - AST> ALT, concern for alcoholic hepatitis.  Also LFTs trending down.  Total bilirubin improving.  Right upper quadrant ultrasound- hepatic steatosis with early cirrhosis.  Some gallbladder wall thickening  Alcohol use per history -Patient unable to tell me if he drinks alcohol daily or not.  We will place him on withdrawal protocol.  Leukocytosis, resolved Urinary tract infection, present on admission -Resolved.  Treated with 5 days of Rocephin  Essential hypertension -Not on any home medications.  PICC line in place due to issues with IV access  DVT prophylaxis: Heparin Code Status: Full code Family Communication: Difficulty with the family.  Spoke with the daughter over the phone and brother at bedside over the weekend Disposition Plan: Maintain hospital stay until mentation is better  Consultants:   Neurology  Procedures:   None  Antimicrobials:   None   Subjective: Still on two-point restraint.  Wakes up to his name but does not carry on any meaningful conversation  Review of Systems Otherwise negative except as per HPI, including: Difficult to obtain Objective: Vitals:   07/31/19 0412 07/31/19 0457 07/31/19 0807 07/31/19 1216  BP: (!) 180/121 (!) 147/80 (!) 183/119 (!) 170/106  Pulse: 86 (!) 105 97 77  Resp: (!) 22     Temp: 98.1 F (36.7  C)  97.9 F (36.6 C) 97.8 F (36.6 C)  TempSrc: Oral  Oral Oral  SpO2: 98% 99% 100% 100%  Weight:        Intake/Output Summary (Last 24 hours) at  07/31/2019 1404 Last data filed at 07/31/2019 0414 Gross per 24 hour  Intake 745.33 ml  Output 700 ml  Net 45.33 ml   Filed Weights   07/24/19 0624  Weight: 73 kg    Examination: Constitutional: Drowsy, wakes up to his name but does not participate in conversation.  Goes right back to sleep. Eyes: PERRL, lids and conjunctivae normal ENMT: Mucous membranes are moist. Posterior pharynx clear of any exudate or lesions.Normal dentition.  Neck: normal, supple, no masses, no thyromegaly Respiratory: clear to auscultation bilaterally, no wheezing, no crackles. Normal respiratory effort. No accessory muscle use.  Cardiovascular: Regular rate and rhythm, no murmurs / rubs / gallops. No extremity edema. 2+ pedal pulses. No carotid bruits.  Abdomen: no tenderness, no masses palpated. No hepatosplenomegaly. Bowel sounds positive.  Musculoskeletal: no clubbing / cyanosis. No joint deformity upper and lower extremities. Good ROM, no contractures. Normal muscle tone.  Skin: no rashes, lesions, ulcers. No induration Neurologic: Difficult to assess but grossly moves all the extremities Psychiatric: Difficult to assess  Right upper extremity PICC line in place since 8/23  Data Reviewed:   CBC: Recent Labs  Lab 07/25/19 0356 07/26/19 0227 07/27/19 0331 07/28/19 0246 07/29/19 0403  WBC 12.8* 11.8* 8.8 8.5 9.5  HGB 13.6 13.8 13.8 12.2* 12.2*  HCT 41.4 42.0 42.4 37.9* 37.6*  MCV 87.3 87.5 89.3 89.4 88.7  PLT 217 208 189 171 488*   Basic Metabolic Panel: Recent Labs  Lab 07/27/19 0331 07/28/19 0246 07/29/19 0403 07/30/19 0900 07/30/19 1101 07/31/19 0314  NA 145 141 142 146*  --  145  K 3.7 2.6* 3.2* 2.8*  --  4.0  CL 103 102 107 118*  --  115*  CO2 _0 21*  --  23  GLUCOSE 85 139* 123* 565* 102* 116*  BUN 76* 66* 48* 39*  --  46*  CREATININE 5.42* 4.18* 3.30* 2.36*  --  2.59*  CALCIUM 8.9 8.5* 8.7* 7.7*  --  8.9  MG 2.1 1.9 1.5* 1.6*  --  2.3   GFR: CrCl cannot be  calculated (Unknown ideal weight.). Liver Function Tests: Recent Labs  Lab 07/27/19 0331 07/28/19 0246 07/29/19 0403 07/30/19 0900 07/31/19 0314  AST 635* 392* 299* 129* 115*  ALT 433* 307* 268* 151* 140*  ALKPHOS 100 99 95 65 74  BILITOT 3.3* 4.0* 4.6* 3.6* 3.6*  PROT 6.6 6.3* 6.6 5.4* 6.1*  ALBUMIN 2.9* 2.6* 2.9* 2.4* 2.6*   No results for input(s): LIPASE, AMYLASE in the last 168 hours. Recent Labs  Lab 07/25/19 0356 07/27/19 0331 07/30/19 0349  AMMONIA 21 42* 34   Coagulation Profile: No results for input(s): INR, PROTIME in the last 168 hours. Cardiac Enzymes: Recent Labs  Lab 07/27/19 0331 07/28/19 0246 07/29/19 0403 07/30/19 0349 07/31/19 0314  CKTOTAL 21,620* 7,230* 4,700* 1,650* 543*   BNP (last 3 results) No results for input(s): PROBNP in the last 8760 hours. HbA1C: No results for input(s): HGBA1C in the last 72 hours. CBG: Recent Labs  Lab 07/30/19 2225 07/31/19 0034 07/31/19 0659 07/31/19 0802 07/31/19 1214  GLUCAP 85 151* 87 107* 94   Lipid Profile: No results for input(s): CHOL, HDL, LDLCALC, TRIG, CHOLHDL, LDLDIRECT in the last 72 hours. Thyroid Function Tests: No results for  input(s): TSH, T4TOTAL, FREET4, T3FREE, THYROIDAB in the last 72 hours. Anemia Panel: No results for input(s): VITAMINB12, FOLATE, FERRITIN, TIBC, IRON, RETICCTPCT in the last 72 hours. Sepsis Labs: Recent Labs  Lab 07/25/19 0356  PROCALCITON 5.64    Recent Results (from the past 240 hour(s))  SARS Coronavirus 2 [facility] order, Performed in [facility] hospital lab)     Status: None   Collection Time: 07/23/19  9:57 PM  Result Value Ref Range Status   SARS Coronavirus 2 NEGATIVE NEGATIVE Final    Comment: (NOTE) If result is NEGATIVE SARS-CoV-2 target nucleic acids are NOT DETECTED. The SARS-CoV-2 RNA is generally detectable in upper and lower  respiratory specimens during the acute phase of infection. The lowest  concentration of SARS-CoV-2 viral copies  this assay can detect is 250  copies / mL. A negative result does not preclude SARS-CoV-2 infection  and should not be used as the sole basis for treatment or other  patient management decisions.  A negative result may occur with  improper specimen collection / handling, submission of specimen other  than nasopharyngeal swab, presence of viral mutation(s) within the  areas targeted by this assay, and inadequate number of viral copies  (<250 copies / mL). A negative result must be combined with clinical  observations, patient history, and epidemiological information. If result is POSITIVE SARS-CoV-2 target nucleic acids are DETECTED. The SARS-CoV-2 RNA is generally detectable in upper and lower  respiratory specimens dur ing the acute phase of infection.  Positive  results are indicative of active infection with SARS-CoV-2.  Clinical  correlation with patient history and other diagnostic information is  necessary to determine patient infection status.  Positive results do  not rule out bacterial infection or co-infection with other viruses. If result is PRESUMPTIVE POSTIVE SARS-CoV-2 nucleic acids MAY BE PRESENT.   A presumptive positive result was obtained on the submitted specimen  and confirmed on repeat testing.  While 2019 novel coronavirus  (SARS-CoV-2) nucleic acids may be present in the submitted sample  additional confirmatory testing may be necessary for epidemiological  and / or clinical management purposes  to differentiate between  SARS-CoV-2 and other Sarbecovirus currently known to infect humans.  If clinically indicated additional testing with an alternate test  methodology [phone]) is advised. The SARS-CoV-2 RNA is generally  detectable in upper and lower respiratory sp ecimens during the acute  phase of infection. The expected result is Negative. Fact Sheet for Patients:  StrictlyIdeas.no Fact Sheet for Healthcare Providers:  BankingDealers.co.za This test is not yet approved or cleared by the [place] FDA and has been authorized for detection and/or diagnosis of SARS-CoV-2 by FDA under an Emergency Use Authorization (EUA).  This EUA will remain in effect (meaning this test can be used) for the duration of the COVID-19 declaration under Section 564(b)(1) of the Act, 21 U.S.C. section 360bbb-3(b)(1), unless the authorization is terminated or revoked sooner. Performed at [facility], [place] [address] [place], [place] [address]   Culture, blood (routine x 2)     Status: None   Collection Time: 07/23/19 10:41 PM   Specimen: BLOOD LEFT HAND  Result Value Ref Range Status   Specimen Description BLOOD LEFT HAND  Final   Special Requests   Final    AEROBIC BOTTLE ONLY Blood Culture results may not be optimal due to an inadequate volume of blood received in culture bottles   Culture   Final    NO GROWTH 5 DAYS Performed  at [facility], [place] [address] [place], [place] [address]    Report Status 07/29/2019 FINAL  Final  Culture, blood (routine x 2)     Status: None   Collection Time: 07/23/19 10:50 PM   Specimen: BLOOD  Result Value Ref Range Status   Specimen Description BLOOD LEFT ANTECUBITAL  Final   Special Requests   Final    AEROBIC BOTTLE ONLY Blood Culture results may not be optimal due to an inadequate volume of blood received in culture bottles   Culture   Final    NO GROWTH 5 DAYS Performed at [facility], [place] [address] [place], [place] [address]    Report Status 07/29/2019 FINAL  Final  MRSA PCR Screening     Status: None   Collection Time: 07/24/19  2:10 AM   Specimen: Nasal Mucosa; Nasopharyngeal  Result Value Ref Range Status   MRSA by PCR NEGATIVE NEGATIVE Final    Comment: Performed at [facility], [place] [address], [place], [place] [address]  Culture, Urine     Status: Abnormal   Collection Time: 07/25/19  1:39 PM   Specimen: Urine,  Random  Result Value Ref Range Status   Specimen Description URINE, RANDOM  Final   Special Requests   Final    NONE Performed at [facility], [place] [address] [place], [place] [address]    Culture MULTIPLE SPECIES PRESENT, SUGGEST RECOLLECTION (A)  Final   Report Status 07/26/2019 FINAL  Final  Culture, blood (Routine X 2) w Reflex to ID Panel     Status: None (Preliminary result)   Collection Time: 07/28/19 12:11 PM   Specimen: BLOOD LEFT ARM  Result Value Ref Range Status   Specimen Description BLOOD LEFT ARM  Final   Special Requests   Final    BOTTLES DRAWN AEROBIC AND ANAEROBIC Blood Culture adequate volume   Culture   Final    NO GROWTH 3 DAYS Performed at [facility], [place] [address] [place], [place] [address]    Report Status PENDING  Incomplete  Culture, blood (Routine X 2) w Reflex to ID Panel     Status: None (Preliminary result)   Collection Time: 07/28/19 12:12 PM   Specimen: BLOOD LEFT ARM  Result Value Ref Range Status   Specimen Description BLOOD LEFT ARM  Final   Special Requests   Final    BOTTLES DRAWN AEROBIC ONLY Blood Culture adequate volume   Culture   Final    NO GROWTH 3 DAYS Performed at [facility], [place] [address], [place], [place] [address]    Report Status PENDING  Incomplete         Radiology Studies: No results found.      Scheduled Meds: .  stroke: mapping our early stages of recovery book   Does not apply Once  . amLODipine  10 mg Oral Daily  . aspirin  300 mg Rectal Daily   Or  . aspirin  325 mg Oral Daily  . chlorhexidine gluconate (MEDLINE KIT)  15 mL Mouth Rinse BID  . cyanocobalamin  1,000 mcg Intramuscular Q30 days  . folic acid  1 mg Intravenous Daily  . heparin  5,000 Units Subcutaneous Q8H  . insulin aspart  0-15 Units Subcutaneous TID WC  . insulin aspart  0-5 Units Subcutaneous QHS  . lactulose  20 g Oral BID   Or  . lactulose  300 mL Rectal BID  .  mouth rinse  15 mL Mouth Rinse 10 times per  day  . metoprolol tartrate  25 mg Oral BID  . multivitamin  15 mL Oral Daily  . thiamine  100 mg Oral Daily   Or  . thiamine  100 mg Intravenous Daily   Continuous Infusions: . sodium chloride 100 mL/hr at 07/31/19 1241     LOS: 8 days   Time spent= 35 mins    Eisen Robenson Arsenio Loader, MD [facility]  If 7PM-7AM, please contact night-coverage [email or web address] 07/31/2019, 2:04 PM

## 2019-08-01 LAB — COMPREHENSIVE METABOLIC PANEL
ALT: 114 U/L — ABNORMAL HIGH (ref 0–44)
AST: 92 U/L — ABNORMAL HIGH (ref 15–41)
Albumin: 2.8 g/dL — ABNORMAL LOW (ref 3.5–5.0)
Alkaline Phosphatase: 76 U/L (ref 38–126)
Anion gap: 12 (ref 5–15)
BUN: 44 mg/dL — ABNORMAL HIGH (ref 6–20)
CO2: 20 mmol/L — ABNORMAL LOW (ref 22–32)
Calcium: 8.9 mg/dL (ref 8.9–10.3)
Chloride: 113 mmol/L — ABNORMAL HIGH (ref 98–111)
Creatinine, Ser: 2.26 mg/dL — ABNORMAL HIGH (ref 0.61–1.24)
GFR calc Af Amer: 38 mL/min — ABNORMAL LOW (ref >60)
GFR calc non Af Amer: 33 mL/min — ABNORMAL LOW (ref >60)
Glucose, Bld: 87 mg/dL (ref 70–99)
Potassium: 4 mmol/L (ref 3.5–5.1)
Sodium: 145 mmol/L (ref 135–145)
Total Bilirubin: 3.4 mg/dL — ABNORMAL HIGH (ref 0.3–1.2)
Total Protein: 6.3 g/dL — ABNORMAL LOW (ref 6.5–8.1)

## 2019-08-01 LAB — GLUCOSE, CAPILLARY
Glucose-Capillary: 74 mg/dL (ref 70–99)
Glucose-Capillary: 99 mg/dL (ref 70–99)
Glucose-Capillary: 84 mg/dL (ref 70–99)
Glucose-Capillary: 83 mg/dL (ref 70–99)

## 2019-08-01 LAB — CK: Total CK: 293 U/L (ref 49–397)

## 2019-08-01 LAB — MAGNESIUM: Magnesium: 1.8 mg/dL (ref 1.7–2.4)

## 2019-08-01 NOTE — Progress Notes (Signed)
STROKE TEAM PROGRESS NOTE   INTERVAL HISTORY Pt lying in bed, calmer than before, 2 point restrain is loosening up. Slightly improved from yesterday, eyes open and able to mumbling his last name on request, but no other meaning for conversation. Not following commands. Will do MRA head.    Vitals:   07/31/19 1529 07/31/19 2101 08/01/19 0515 08/01/19 0800  BP: (!) 140/100 132/81 (!) 139/97 (!) 135/115  Pulse:  (!) 56 92 93  Resp:  12    Temp: 98.7 F (37.1 C)  98.1 F (36.7 C) 98.2 F (36.8 C)  TempSrc: Axillary  Axillary Oral  SpO2: 100% 100% 99% 100%  Weight:        CBC:  Recent Labs  Lab 07/28/19 0246 07/29/19 0403  WBC 8.5 9.5  HGB 12.2* 12.2*  HCT 37.9* 37.6*  MCV 89.4 88.7  PLT 171 148*    Basic Metabolic Panel:  Recent Labs  Lab 07/31/19 0314 08/01/19 0356  NA 145 145  K 4.0 4.0  CL 115* 113*  CO2 23 20*  GLUCOSE 116* 87  BUN 46* 44*  CREATININE 2.59* 2.26*  CALCIUM 8.9 8.9  MG 2.3 1.8   Lipid Panel:     Component Value Date/Time   CHOL 150 07/27/2019 0331   TRIG 239 (H) 07/27/2019 0331   HDL 19 (L) 07/27/2019 0331   CHOLHDL 7.9 07/27/2019 0331   VLDL 48 (H) 07/27/2019 0331   LDLCALC 83 07/27/2019 0331   HgbA1c:  Lab Results  Component Value Date   HGBA1C 5.1 07/27/2019   Urine Drug Screen:     Component Value Date/Time   LABOPIA NONE DETECTED 07/28/2019 0951   COCAINSCRNUR NONE DETECTED 07/28/2019 0951   LABBENZ POSITIVE (A) 07/28/2019 0951   AMPHETMU POSITIVE (A) 07/28/2019 0951   THCU NONE DETECTED 07/28/2019 0951   LABBARB NONE DETECTED 07/28/2019 0951    Alcohol Level     Component Value Date/Time   ETH 127 (H) 04/20/2012 1839    IMAGING past 24h No results found.   Vas US Carotid (at [place] Only) 07/27/2019 Summary:  Right Carotid: Velocities in the right ICA are consistent with a 1-39% stenosis.  Left Carotid: Velocities in the left ICA are consistent with a 1-39% stenosis.  Vertebrals:  Bilateral vertebral arteries  demonstrate antegrade flow.  Subclavians: Normal flow hemodynamics were seen in bilateral subclavian arteries.    PHYSICAL EXAM   Frail malnourished looking middle-aged Caucasian male not in distress but mildly agitated with stimulation. Afebrile. Head is nontraumatic. Neck is supple without bruit.  Cardiac exam no murmur or gallop. Lungs are clear to auscultation. Distal pulses are well felt.  Neurological Exam :  Patient more awake alert, eyes open spontaneously, but is still not following commands, not cooperative on exam. Mumbling words only able to tell me his last night with severe dysarthria but no other meaningful words. Not consistently blinking to visual threat, limited tracking bilaterally, more to the left than right. PERRL. There is no obvious facial weakness.  Tongue midline in mouth.  He is able to move all 4 extremities spontaneously against gravity and there does not appear to be focal weakness. Sensation, coordination and gait not tested.   ASSESSMENT/PLAN [name] is a 48 y.o. male with history of alcohol and drug use found unresponsive with rhabdo and AKI. MRI done for altered mental status shows multifocal patchy infarcts throughout the brain. Transferred from [facility]   Stroke: Patchy B anterior and posterior  infarcts embolic, embolic pattern, could be due to severe dehydration AKI in the setting of diffuse intracranial stenosis. However, cardioembolic source cannot rule out such as afib or endocarditis   Resultant - pt still agitation on restrain, not following commands  MRI  Patchy acute B cerebral and cerebellar infarcts. R temporal lobe encephalomalacia and chronic cerebral white matter disease   MRA head pending  Carotid Doppler unremarkable  2D Echo EF 60-65%  LE venous doppler no DVT  EEG no sz, diffuse encephalopathy  May consider 30 day cardiac event monitoring as outpt to rule out afib if above work up unrevealing and pt neuro  improves.   LDL 83  HgbA1c 5.1  UDS - positive for amphetamines and benzodiazepine  Blood culture NGTD  Heparin 5000 units sq tid for VTE prophylaxis  No antithrombotic prior to admission, now on aspirin 325 mg daily or ASA 300 PR.    Therapy recommendations:  SNF  Disposition:  pending   Metabolic encephalopathy  Likely due to rhabodo, AKI, elevated CK, LFTs and ammonia as well as alcohol withdraw  Treatment per primary team  EEG no seizure  Continue current manatement  Rhabdomyolysis AKI Elevated AST/ALT  Found down at home   Cre 5.79->5.42->4.18->3.30->2.36->2.59->2.26  Not eating or drinking much due to mental status  Continue IVF @ 100cc/h  CK and AST/ALT continues to improve  Continue monitoring   Elevated ammonia  On lactulose  Ammonia 55->21->42->34  Continue monitoring  Alcohol abuse  On CIWA protocol  On B1/FA/MVI  Seizure precautions  Hypertensive Emergency  BP as high as 237/138  Stable but on the higher end  Continue amlodipine 10mg   Add metoprolol 25mg  bid . gradually normalize in 5-7 days . Long-term BP goal normotensive  Hyperlipidemia  Home meds:  No statin  LDL 83, goal < 70  No statin for now given transaminitis  UTI, resolved  UA WBC  50  Leukocytosis resolved  Blood culture NGTD  Off rocephin  Dysphagia   Decrease po intake  Not taking much po  Na 142->146->145  Continue IVF on 1/2NS @ 100  Tobacco abuse  Current smoker  Smoking cessation counseling will be provided  Other Stroke Risk Factors  Hx of drug abuse. UDS positive for cocaine in 2013  Other Active Problems  leukocytopsis WBC 12.8->11.8->8.5->9.5  Hypokalemia - K 2.6 ->3.2 ->2.8->4.0  UTI tx w/ 5d course rocephin  Hospital day # 9  Marvel PlanJindong Coe Angelos, MD PhD Stroke Neurology 08/01/2019 2:42 PM  To contact Stroke Continuity provider, please refer to [email or web address]. After hours, contact General Neurology

## 2019-08-01 NOTE — Progress Notes (Signed)
   08/01/19 0815  Restraint Order  Length of Order Daily  Restraint Status STOP  Assessment  Less Restrictive Interventions Attempted Yes  Health history reviewed prior to applying restraint Yes  Justification  Clinical Justification Pulling lines;Removal of equipment  Plan to Progress Out Reality orientation;Family participation;Interdisciplinary collaboration  Education  Discontinuation Criteria Alternative intervention effective;Follows instructions  Discontinuation Criteria Explained Yes  Family Notification Family not available at this time  Restraint Every 2 Hour Monitoring  Airway Clear with Spontaneous Respirations Yes  Circulation / Skin Integrity No signs of injury  Emotional / Mental Status Patient calm/resting  Range of Motion Performed  Food and Fluids Fluid/Food offered  Elimination Patient asleep  Patient's rights, dignity, safety maintained Yes  Can Restraints be Less Restrictive or Discontinued? No  Medical Device Prophylactic Dressing Under Restraints  Skin Assessed Under All Medical Device Prophylactic Dressings (if applicable) Done - Skin Intact  Non-violent Restraints  Soft Restraint Right Wrist Discontinued  Soft Restraint Left Wrist Discontinued  Soft Restraint Right Ankle Discontinued  Soft Restraint Left Ankle Discontinued  Safety Interventions  Less Restrictive Interventions Active listening;Disguise equipment (limit access to medical devices);Provide reassurance  Wrist restraints are off at this time. Trial to see how pt does

## 2019-08-01 NOTE — Progress Notes (Signed)
Patient ID: Tyler [name], male   DOB: 09-Apr-1971, 48 y.o.   MRN: [id number]  PROGRESS NOTE    Tyler [name]  KGU:[id number] DOB: 17-Sep-1971 DOA: 07/23/2019 PCP: Patient, No Pcp Per   Brief Narrative:  48 year old male with history of polysubstance abuse including heroin presented to [facility] ER after being found down with a syringe nearby.  He was given Narcan with minimal improvement.  Found in severe rhabdomyolysis with acute kidney injury and hyperkalemia.  He was started on IV fluids including bicarb drip and transferred to [facility] for further care.  Rhabdomyolysis improved but he had persistence of encephalopathy and was found to have acute bilateral cerebellar infarct.  Neurology was consulted.  Awaiting renal function to improve so that CTA of the head and neck can be performed; if fails to improve, plan is to get MRA head and neck.  Assessment & Plan:  Acute metabolic encephalopathy: Multifactorial -Most likely secondary to drug use, uremia, heavy alcohol use -Very slow to improve -CT of the head and neck was initially negative for any traumatic finding.  UDS was negative.  Tylenol and salicylate levels were negative. -TSH, folate, B12 levels -EEG: Generalized diffuse slowing but no seizure activity -Apparently, as per nursing staff, he had to be put on restraints overnight which have been removed earlier this morning. -Continue to monitor mental status.  Fall precautions.  Acute bilateral CVA, nonhemorrhagic -MRI of the brain showed acute CVA.  No focal neurologic deficits -Neurology following.  Ideally would need CTA of the head and neck once renal function improves; if fails to improve: Perform MRA of the head and neck -Continue aspirin.  LDL 83; A1c 5.1 -Echocardiogram: EF of 60 to 65% -Lower extremity Dopplers: Negative for DVT -Follow PT/OT/SLP recommendations.  PT recommends SNF.  Severe rhabdomyolysis Acute kidney injury -Significantly improved.  Renal ultrasound  negative -Creatinine peaked to 5.79.  Creatinine improving: Creatinine 2.26 today.  Continues to make urine.  Continue strict input and output.  Continue IV fluids.  Monitor creatinine  Transaminitis with elevated bilirubin -Improving.  Trend LFTs.  Right upper quadrant ultrasound showed hepatic steatosis with early cirrhosis with some gallbladder thickening.  Alcohol use per history -Patient unable to tell me if he drinks alcohol daily or not.  Continue withdrawal protocol.  Continue thiamine, multivitamin and folic acid.  Leukocytosis -Resolved  Urinary tract infection: Present on admission -Resolved.  Treated with 5 days of Rocephin  Essential hypertension--monitor blood pressure.  Not on any antihypertensives at home.  DVT prophylaxis: Heparin Code Status: Full Family Communication: None at bedside Disposition Plan: We will need SNF placement once cleared by neurology.  Consultants: Neurology  Procedures:  Echo IMPRESSIONS    1. The left ventricle has normal systolic function with an ejection fraction of 60-65%. The cavity size was normal. Left ventricular diastolic Doppler parameters are consistent with impaired relaxation. No evidence of left ventricular regional wall  motion abnormalities.  2. The right ventricle has normal systolic function. The cavity was normal. There is no increase in right ventricular wall thickness. Right ventricular systolic pressure is normal with an estimated pressure of 21.0 mmHg.  3. The aortic valve is tricuspid. Moderate sclerosis of the aortic valve.  4. The aorta is normal unless otherwise noted.  Antimicrobials:  Anti-infectives (From admission, onward)   Start     Dose/Rate Route Frequency Ordered Stop   07/25/19 0745  cefTRIAXone (ROCEPHIN) 1 g in sodium chloride 0.9 % 100 mL IVPB     1 g  200 mL/hr over 30 Minutes Intravenous Every 24 hours 07/25/19 0734 07/29/19 1518       Subjective: Patient seen and examined at bedside.  He  is awake, hardly answers any questions.  As per the nursing staff, he was on restraints overnight which were taken out this morning.  No overnight fever or vomiting reported.  Objective: Vitals:   07/31/19 1529 07/31/19 2101 08/01/19 0515 08/01/19 0800  BP: (!) 140/100 132/81 (!) 139/97 (!) 135/115  Pulse:  (!) 56 92 93  Resp:  12    Temp: 98.7 F (37.1 C)  98.1 F (36.7 C) 98.2 F (36.8 C)  TempSrc: Axillary  Axillary Oral  SpO2: 100% 100% 99% 100%  Weight:        Intake/Output Summary (Last 24 hours) at 08/01/2019 1115 Last data filed at 08/01/2019 0614 Gross per 24 hour  Intake 60 ml  Output 900 ml  Net -840 ml   Filed Weights   07/24/19 0624  Weight: 73 kg    Examination:  General exam: Appears calm and comfortable.  Awake but hardly answers any questions. Respiratory system: Bilateral decreased breath sounds at bases Cardiovascular system: S1 & S2 heard, Rate controlled Gastrointestinal system: Abdomen is nondistended, soft and nontender. Normal bowel sounds heard. Extremities: No cyanosis, clubbing, edema    Data Reviewed: I have personally reviewed following labs and imaging studies  CBC: Recent Labs  Lab 07/26/19 0227 07/27/19 0331 07/28/19 0246 07/29/19 0403  WBC 11.8* 8.8 8.5 9.5  HGB 13.8 13.8 12.2* 12.2*  HCT 42.0 42.4 37.9* 37.6*  MCV 87.5 89.3 89.4 88.7  PLT 208 189 171 939*   Basic Metabolic Panel: Recent Labs  Lab 07/28/19 0246 07/29/19 0403 07/30/19 0900 07/30/19 1101 07/31/19 0314 08/01/19 0356  NA 141 142 146*  --  145 145  K 2.6* 3.2* 2.8*  --  4.0 4.0  CL 102 107 118*  --  115* 113*  CO2 26 24 21*  --  23 20*  GLUCOSE 139* 123* 565* 102* 116* 87  BUN 66* 48* 39*  --  46* 44*  CREATININE 4.18* 3.30* 2.36*  --  2.59* 2.26*  CALCIUM 8.5* 8.7* 7.7*  --  8.9 8.9  MG 1.9 1.5* 1.6*  --  2.3 1.8   GFR: CrCl cannot be calculated (Unknown ideal weight.). Liver Function Tests: Recent Labs  Lab 07/28/19 0246 07/29/19 0403 07/30/19  0900 07/31/19 0314 08/01/19 0356  AST 392* 299* 129* 115* 92*  ALT 307* 268* 151* 140* 114*  ALKPHOS 99 95 65 74 76  BILITOT 4.0* 4.6* 3.6* 3.6* 3.4*  PROT 6.3* 6.6 5.4* 6.1* 6.3*  ALBUMIN 2.6* 2.9* 2.4* 2.6* 2.8*   No results for input(s): LIPASE, AMYLASE in the last 168 hours. Recent Labs  Lab 07/27/19 0331 07/30/19 0349  AMMONIA 42* 34   Coagulation Profile: No results for input(s): INR, PROTIME in the last 168 hours. Cardiac Enzymes: Recent Labs  Lab 07/28/19 0246 07/29/19 0403 07/30/19 0349 07/31/19 0314 08/01/19 0356  CKTOTAL 7,230* 4,700* 1,650* 543* 293   BNP (last 3 results) No results for input(s): PROBNP in the last 8760 hours. HbA1C: No results for input(s): HGBA1C in the last 72 hours. CBG: Recent Labs  Lab 07/31/19 0802 07/31/19 1214 07/31/19 1726 07/31/19 2100 08/01/19 0821  GLUCAP 107* 94 94 87 74   Lipid Profile: No results for input(s): CHOL, HDL, LDLCALC, TRIG, CHOLHDL, LDLDIRECT in the last 72 hours. Thyroid Function Tests: No results for input(s): TSH, T4TOTAL,  FREET4, T3FREE, THYROIDAB in the last 72 hours. Anemia Panel: No results for input(s): VITAMINB12, FOLATE, FERRITIN, TIBC, IRON, RETICCTPCT in the last 72 hours. Sepsis Labs: No results for input(s): PROCALCITON, LATICACIDVEN in the last 168 hours.  Recent Results (from the past 240 hour(s))  SARS Coronavirus 2 [facility] order, Performed in [place] hospital lab)     Status: None   Collection Time: 07/23/19  9:57 PM  Result Value Ref Range Status   SARS Coronavirus 2 NEGATIVE NEGATIVE Final    Comment: (NOTE) If result is NEGATIVE SARS-CoV-2 target nucleic acids are NOT DETECTED. The SARS-CoV-2 RNA is generally detectable in upper and lower  respiratory specimens during the acute phase of infection. The lowest  concentration of SARS-CoV-2 viral copies this assay can detect is 250  copies / mL. A negative result does not preclude SARS-CoV-2 infection  and should not be  used as the sole basis for treatment or other  patient management decisions.  A negative result may occur with  improper specimen collection / handling, submission of specimen other  than nasopharyngeal swab, presence of viral mutation(s) within the  areas targeted by this assay, and inadequate number of viral copies  (<250 copies / mL). A negative result must be combined with clinical  observations, patient history, and epidemiological information. If result is POSITIVE SARS-CoV-2 target nucleic acids are DETECTED. The SARS-CoV-2 RNA is generally detectable in upper and lower  respiratory specimens dur ing the acute phase of infection.  Positive  results are indicative of active infection with SARS-CoV-2.  Clinical  correlation with patient history and other diagnostic information is  necessary to determine patient infection status.  Positive results do  not rule out bacterial infection or co-infection with other viruses. If result is PRESUMPTIVE POSTIVE SARS-CoV-2 nucleic acids MAY BE PRESENT.   A presumptive positive result was obtained on the submitted specimen  and confirmed on repeat testing.  While 2019 novel coronavirus  (SARS-CoV-2) nucleic acids may be present in the submitted sample  additional confirmatory testing may be necessary for epidemiological  and / or clinical management purposes  to differentiate between  SARS-CoV-2 and other Sarbecovirus currently known to infect humans.  If clinically indicated additional testing with an alternate test  methodology [phone]) is advised. The SARS-CoV-2 RNA is generally  detectable in upper and lower respiratory sp ecimens during the acute  phase of infection. The expected result is Negative. Fact Sheet for Patients:  StrictlyIdeas.no Fact Sheet for Healthcare Providers: BankingDealers.co.za This test is not yet approved or cleared by the [place] FDA and has been authorized  for detection and/or diagnosis of SARS-CoV-2 by FDA under an Emergency Use Authorization (EUA).  This EUA will remain in effect (meaning this test can be used) for the duration of the COVID-19 declaration under Section 564(b)(1) of the Act, 21 U.S.C. section 360bbb-3(b)(1), unless the authorization is terminated or revoked sooner. Performed at [facility], [place] [address], [place], [place] [address]   Culture, blood (routine x 2)     Status: None   Collection Time: 07/23/19 10:41 PM   Specimen: BLOOD LEFT HAND  Result Value Ref Range Status   Specimen Description BLOOD LEFT HAND  Final   Special Requests   Final    AEROBIC BOTTLE ONLY Blood Culture results may not be optimal due to an inadequate volume of blood received in culture bottles   Culture   Final    NO GROWTH 5 DAYS Performed at [facility]  [facility], [place] [address] [place], [place] [address]    Report Status 07/29/2019 FINAL  Final  Culture, blood (routine x 2)     Status: None   Collection Time: 07/23/19 10:50 PM   Specimen: BLOOD  Result Value Ref Range Status   Specimen Description BLOOD LEFT ANTECUBITAL  Final   Special Requests   Final    AEROBIC BOTTLE ONLY Blood Culture results may not be optimal due to an inadequate volume of blood received in culture bottles   Culture   Final    NO GROWTH 5 DAYS Performed at [facility], [place] [address] [place], [place] [address]    Report Status 07/29/2019 FINAL  Final  MRSA PCR Screening     Status: None   Collection Time: 07/24/19  2:10 AM   Specimen: Nasal Mucosa; Nasopharyngeal  Result Value Ref Range Status   MRSA by PCR NEGATIVE NEGATIVE Final    Comment: Performed at [facility], [place] [address] [place], [place] [address]  Culture, Urine     Status: Abnormal   Collection Time: 07/25/19  1:39 PM   Specimen: Urine, Random  Result Value Ref Range Status   Specimen Description URINE, RANDOM  Final   Special Requests   Final    NONE Performed at  [facility], [place] [address], [place], [place] [address]    Culture MULTIPLE SPECIES PRESENT, SUGGEST RECOLLECTION (A)  Final   Report Status 07/26/2019 FINAL  Final  Culture, blood (Routine X 2) w Reflex to ID Panel     Status: None (Preliminary result)   Collection Time: 07/28/19 12:11 PM   Specimen: BLOOD LEFT ARM  Result Value Ref Range Status   Specimen Description BLOOD LEFT ARM  Final   Special Requests   Final    BOTTLES DRAWN AEROBIC AND ANAEROBIC Blood Culture adequate volume   Culture   Final    NO GROWTH 3 DAYS Performed at [facility] [facility], [place] [address] [place], [place] [address]    Report Status PENDING  Incomplete  Culture, blood (Routine X 2) w Reflex to ID Panel     Status: None (Preliminary result)   Collection Time: 07/28/19 12:12 PM   Specimen: BLOOD LEFT ARM  Result Value Ref Range Status   Specimen Description BLOOD LEFT ARM  Final   Special Requests   Final    BOTTLES DRAWN AEROBIC ONLY Blood Culture adequate volume   Culture   Final    NO GROWTH 3 DAYS Performed at [facility], [place] [address], [place],  [address]    Report Status PENDING  Incomplete         Radiology Studies: No results found.      Scheduled Meds: .  stroke: mapping our early stages of recovery book   Does not apply Once  . amLODipine  10 mg Oral Daily  . aspirin  300 mg Rectal Daily   Or  . aspirin  325 mg Oral Daily  . chlorhexidine gluconate (MEDLINE KIT)  15 mL Mouth Rinse BID  . cyanocobalamin  1,000 mcg Intramuscular Q30 days  . folic acid  1 mg Intravenous Daily  . heparin  5,000 Units Subcutaneous Q8H  . insulin aspart  0-15 Units Subcutaneous TID WC  . insulin aspart  0-5 Units Subcutaneous QHS  . lactulose  20 g Oral BID   Or  . lactulose  300 mL Rectal BID  . mouth rinse  15 mL Mouth Rinse 10 times per day  . metoprolol tartrate  25 mg Oral BID  . multivitamin  15 mL Oral Daily  . thiamine  100 mg Oral Daily   Or  . thiamine  100  mg Intravenous Daily   Continuous Infusions: . sodium chloride 100 mL/hr at 08/01/19 0614     LOS: 9 days        Aline August, MD [facility] 08/01/2019, 11:15 AM

## 2019-08-01 NOTE — Progress Notes (Signed)
   08/01/19 1100  Restraint Order  Length of Order Daily  Restraint Status CONTINUE  Restraint Every 2 Hour Monitoring  Airway Clear with Spontaneous Respirations Yes  Circulation / Skin Integrity No signs of injury  Emotional / Mental Status Patient calm/resting  Range of Motion Performed  Food and Fluids Fluid/Food offered  Elimination Urinary catheter (External)  Patient's rights, dignity, safety maintained Yes  Can Restraints be Less Restrictive or Discontinued? No  Medical Device Prophylactic Dressing Under Restraints  Skin Assessed Under All Medical Device Prophylactic Dressings (if applicable) Done - Skin Intact  Non-violent Restraints  Soft Restraint Right Wrist Continued  Soft Restraint Left Wrist Continued  Soft Restraint Right Ankle Discontinued  Soft Restraint Left Ankle Discontinued

## 2019-08-02 ENCOUNTER — Inpatient Hospital Stay (HOSPITAL_COMMUNITY): Payer: Medicaid Other

## 2019-08-02 LAB — CULTURE, BLOOD (ROUTINE X 2)
Culture: NO GROWTH
Culture: NO GROWTH
Special Requests: ADEQUATE
Special Requests: ADEQUATE

## 2019-08-02 LAB — COMPREHENSIVE METABOLIC PANEL
ALT: 93 U/L — ABNORMAL HIGH (ref 0–44)
AST: 85 U/L — ABNORMAL HIGH (ref 15–41)
Albumin: 2.6 g/dL — ABNORMAL LOW (ref 3.5–5.0)
Alkaline Phosphatase: 68 U/L (ref 38–126)
Anion gap: 11 (ref 5–15)
BUN: 41 mg/dL — ABNORMAL HIGH (ref 6–20)
CO2: 20 mmol/L — ABNORMAL LOW (ref 22–32)
Calcium: 8.7 mg/dL — ABNORMAL LOW (ref 8.9–10.3)
Chloride: 110 mmol/L (ref 98–111)
Creatinine, Ser: 2.02 mg/dL — ABNORMAL HIGH (ref 0.61–1.24)
GFR calc Af Amer: 43 mL/min — ABNORMAL LOW (ref >60)
GFR calc non Af Amer: 37 mL/min — ABNORMAL LOW (ref >60)
Glucose, Bld: 81 mg/dL (ref 70–99)
Potassium: 3.8 mmol/L (ref 3.5–5.1)
Sodium: 141 mmol/L (ref 135–145)
Total Bilirubin: 2.8 mg/dL — ABNORMAL HIGH (ref 0.3–1.2)
Total Protein: 6.1 g/dL — ABNORMAL LOW (ref 6.5–8.1)

## 2019-08-02 LAB — CBC WITH DIFFERENTIAL/PLATELET
Abs Immature Granulocytes: 0.37 10*3/uL — ABNORMAL HIGH (ref 0.00–0.07)
Basophils Absolute: 0.1 10*3/uL (ref 0.0–0.1)
Basophils Relative: 1 %
Eosinophils Absolute: 0.2 10*3/uL (ref 0.0–0.5)
Eosinophils Relative: 2 %
HCT: 32.1 % — ABNORMAL LOW (ref 39.0–52.0)
Hemoglobin: 10.1 g/dL — ABNORMAL LOW (ref 13.0–17.0)
Immature Granulocytes: 3 %
Lymphocytes Relative: 22 %
Lymphs Abs: 2.6 10*3/uL (ref 0.7–4.0)
MCH: 29.4 pg (ref 26.0–34.0)
MCHC: 31.5 g/dL (ref 30.0–36.0)
MCV: 93.6 fL (ref 80.0–100.0)
Monocytes Absolute: 1 10*3/uL (ref 0.1–1.0)
Monocytes Relative: 9 %
Neutro Abs: 7.6 10*3/uL (ref 1.7–7.7)
Neutrophils Relative %: 63 %
Platelets: 214 10*3/uL (ref 150–400)
RBC: 3.43 MIL/uL — ABNORMAL LOW (ref 4.22–5.81)
RDW: 15.8 % — ABNORMAL HIGH (ref 11.5–15.5)
WBC: 11.9 10*3/uL — ABNORMAL HIGH (ref 4.0–10.5)
nRBC: 0 % (ref 0.0–0.2)

## 2019-08-02 LAB — GLUCOSE, CAPILLARY
Glucose-Capillary: 84 mg/dL (ref 70–99)
Glucose-Capillary: 83 mg/dL (ref 70–99)
Glucose-Capillary: 81 mg/dL (ref 70–99)
Glucose-Capillary: 89 mg/dL (ref 70–99)

## 2019-08-02 LAB — CK: Total CK: 190 U/L (ref 49–397)

## 2019-08-02 LAB — MAGNESIUM: Magnesium: 1.6 mg/dL — ABNORMAL LOW (ref 1.7–2.4)

## 2019-08-02 MED ORDER — HALOPERIDOL LACTATE 5 MG/ML IJ SOLN
2.0000 mg | Freq: Four times a day (QID) | INTRAMUSCULAR | Status: DC | PRN
  Filled 2019-08-02 (×2): qty 1

## 2019-08-02 MED ORDER — CYANOCOBALAMIN 1000 MCG/ML IJ SOLN
1000.0000 ug | Freq: Every day | INTRAMUSCULAR | Status: AC
  Administered 2019-08-02 – 2019-08-06 (×5): 1000 ug via INTRAMUSCULAR
  Filled 2019-08-02 (×5): qty 1

## 2019-08-02 MED ORDER — METOPROLOL TARTRATE 25 MG PO TABS
25.0000 mg | ORAL_TABLET | Freq: Two times a day (BID) | ORAL | Status: DC
  Administered 2019-08-02 – 2019-11-24 (×216): 25 mg via ORAL
  Filled 2019-08-02 (×222): qty 1

## 2019-08-02 MED ORDER — HALOPERIDOL LACTATE 5 MG/ML IJ SOLN: 2.0000 mg | Freq: Four times a day (QID) | INTRAMUSCULAR | Status: DC | PRN

## 2019-08-02 MED ORDER — AMLODIPINE BESYLATE 10 MG PO TABS
10.0000 mg | ORAL_TABLET | Freq: Every day | ORAL | Status: DC
  Administered 2019-08-02 – 2019-08-14 (×13): 10 mg via ORAL
  Filled 2019-08-02 (×12): qty 1

## 2019-08-02 NOTE — Progress Notes (Signed)
Patient ID: Tyler [name], male   DOB: [birth year], 48 y.o.   MRN: [id number]  PROGRESS NOTE    Tyler [name]  JOI:[id number] DOB: [birth year] DOA: 07/23/2019 PCP: Patient, No Pcp Per   Brief Narrative:  48 year old male with history of polysubstance abuse including heroin presented to [facility] ER after being found down with a syringe nearby.  He was given Narcan with minimal improvement.  Found in severe rhabdomyolysis with acute kidney injury and hyperkalemia.  He was started on IV fluids including bicarb drip and transferred to [facility] for further care.  Rhabdomyolysis improved but he had persistence of encephalopathy and was found to have acute bilateral cerebellar infarct.  Neurology was consulted.   Assessment & Plan:  Acute metabolic encephalopathy: Multifactorial -Most likely secondary to drug use, uremia, heavy alcohol use -Very slow to improve -CT of the head and neck was initially negative for any traumatic finding.  UDS was negative.  Tylenol and salicylate levels were negative. -TSH, folate, B12 levels -EEG: Generalized diffuse slowing but no seizure activity -Currently back on restraints.  DC restraints as able. -Continue to monitor mental status.  Fall precautions.  Acute bilateral CVA, nonhemorrhagic -MRI of the brain showed acute CVA.  No focal neurologic deficits -Neurology following.  MRA of the head and neck is pending. -Continue aspirin.  LDL 83; A1c 5.1 -Echocardiogram: EF of 60 to 65% -Lower extremity Dopplers: Negative for DVT -Follow PT/OT/SLP recommendations.  PT recommends SNF.  Severe rhabdomyolysis Acute kidney injury -Significantly improved.  Renal ultrasound negative -Creatinine peaked to 5.79.  Creatinine improving: Creatinine 2.02 today.  Continues to make urine.  Continue strict input and output.  Continue IV fluids.  Monitor creatinine  Hypomagnesemia-replace.  Repeat a.m. labs  Transaminitis with elevated bilirubin -Improving.  Trend LFTs.   Right upper quadrant ultrasound showed hepatic steatosis with early cirrhosis with some gallbladder thickening.  Alcohol use per history -Patient unable to tell me if he drinks alcohol daily or not.  Continue withdrawal protocol.  Continue thiamine, multivitamin and folic acid.  Leukocytosis -Resolved  Urinary tract infection: Present on admission -Resolved.  Treated with 5 days of Rocephin  Essential hypertension--monitor blood pressure.  Not on any antihypertensives at home.  DVT prophylaxis: Heparin Code Status: Full Family Communication: None at bedside Disposition Plan: We will need SNF placement once cleared by neurology.  Consultants: Neurology  Procedures:  Echo IMPRESSIONS    1. The left ventricle has normal systolic function with an ejection fraction of 60-65%. The cavity size was normal. Left ventricular diastolic Doppler parameters are consistent with impaired relaxation. No evidence of left ventricular regional wall  motion abnormalities.  2. The right ventricle has normal systolic function. The cavity was normal. There is no increase in right ventricular wall thickness. Right ventricular systolic pressure is normal with an estimated pressure of 21.0 mmHg.  3. The aortic valve is tricuspid. Moderate sclerosis of the aortic valve.  4. The aorta is normal unless otherwise noted.  Antimicrobials:  Anti-infectives (From admission, onward)   Start     Dose/Rate Route Frequency Ordered Stop   07/25/19 0745  cefTRIAXone (ROCEPHIN) 1 g in sodium chloride 0.9 % 100 mL IVPB     1 g 200 mL/hr over 30 Minutes Intravenous Every 24 hours 07/25/19 0734 07/29/19 1518       Subjective: Patient seen and examined at bedside.  He is a poor historian.  Awake but does not want to answer any questions.  No overnight fever, vomiting reported.  She had to be put back on restraints as per nursing staff for agitation. Objective: Vitals:   08/01/19 2051 08/01/19 2345 08/02/19 0422  08/02/19 0426  BP: (!) 148/87 (!) 134/95 (!) 153/98   Pulse: 79 61 79   Resp:      Temp: (!) 97.3 F (36.3 C) 97.7 F (36.5 C) 98.6 F (37 C)   TempSrc: Oral Oral Oral   SpO2: 100% 100% 100%   Weight:    67.8 kg    Intake/Output Summary (Last 24 hours) at 08/02/2019 0734 Last data filed at 08/01/2019 2332 Gross per 24 hour  Intake --  Output 1200 ml  Net -1200 ml   Filed Weights   07/24/19 0624 08/02/19 0426  Weight: 73 kg 67.8 kg    Examination:  General exam: No acute distress.  Awake but does not answer questions.  Respiratory system: Bilateral decreased breath sounds at bases with some scattered crackles Cardiovascular system: Rate controlled, S1-S2 heard  gastrointestinal system: Abdomen is nondistended, soft and nontender. Normal bowel sounds heard. Extremities: No cyanosis, edema    Data Reviewed: I have personally reviewed following labs and imaging studies  CBC: Recent Labs  Lab 07/27/19 0331 07/28/19 0246 07/29/19 0403 08/02/19 0414  WBC 8.8 8.5 9.5 11.9*  NEUTROABS  --   --   --  PENDING  HGB 13.8 12.2* 12.2* 10.1*  HCT 42.4 37.9* 37.6* 32.1*  MCV 89.3 89.4 88.7 93.6  PLT 189 171 148* 767   Basic Metabolic Panel: Recent Labs  Lab 07/29/19 0403 07/30/19 0900 07/30/19 1101 07/31/19 0314 08/01/19 0356 08/02/19 0414  NA 142 146*  --  145 145 141  K 3.2* 2.8*  --  4.0 4.0 3.8  CL 107 118*  --  115* 113* 110  CO2 24 21*  --  23 20* 20*  GLUCOSE 123* 565* 102* 116* 87 81  BUN 48* 39*  --  46* 44* 41*  CREATININE 3.30* 2.36*  --  2.59* 2.26* 2.02*  CALCIUM 8.7* 7.7*  --  8.9 8.9 8.7*  MG 1.5* 1.6*  --  2.3 1.8 1.6*   GFR: CrCl cannot be calculated (Unknown ideal weight.). Liver Function Tests: Recent Labs  Lab 07/29/19 0403 07/30/19 0900 07/31/19 0314 08/01/19 0356 08/02/19 0414  AST 299* 129* 115* 92* 85*  ALT 268* 151* 140* 114* 93*  ALKPHOS 95 65 74 76 68  BILITOT 4.6* 3.6* 3.6* 3.4* 2.8*  PROT 6.6 5.4* 6.1* 6.3* 6.1*  ALBUMIN  2.9* 2.4* 2.6* 2.8* 2.6*   No results for input(s): LIPASE, AMYLASE in the last 168 hours. Recent Labs  Lab 07/27/19 0331 07/30/19 0349  AMMONIA 42* 34   Coagulation Profile: No results for input(s): INR, PROTIME in the last 168 hours. Cardiac Enzymes: Recent Labs  Lab 07/29/19 0403 07/30/19 0349 07/31/19 0314 08/01/19 0356 08/02/19 0414  CKTOTAL 4,700* 1,650* 543* 293 190   BNP (last 3 results) No results for input(s): PROBNP in the last 8760 hours. HbA1C: No results for input(s): HGBA1C in the last 72 hours. CBG: Recent Labs  Lab 08/01/19 0821 08/01/19 1251 08/01/19 1644 08/01/19 2211 08/02/19 0652  GLUCAP 74 99 84 83 84   Lipid Profile: No results for input(s): CHOL, HDL, LDLCALC, TRIG, CHOLHDL, LDLDIRECT in the last 72 hours. Thyroid Function Tests: No results for input(s): TSH, T4TOTAL, FREET4, T3FREE, THYROIDAB in the last 72 hours. Anemia Panel: No results for input(s): VITAMINB12, FOLATE, FERRITIN, TIBC, IRON, RETICCTPCT in the last 72 hours. Sepsis Labs:  No results for input(s): PROCALCITON, LATICACIDVEN in the last 168 hours.  Recent Results (from the past 240 hour(s))  SARS Coronavirus 2 [facility] order, Performed in [place] hospital lab)     Status: None   Collection Time: 07/23/19  9:57 PM  Result Value Ref Range Status   SARS Coronavirus 2 NEGATIVE NEGATIVE Final    Comment: (NOTE) If result is NEGATIVE SARS-CoV-2 target nucleic acids are NOT DETECTED. The SARS-CoV-2 RNA is generally detectable in upper and lower  respiratory specimens during the acute phase of infection. The lowest  concentration of SARS-CoV-2 viral copies this assay can detect is 250  copies / mL. A negative result does not preclude SARS-CoV-2 infection  and should not be used as the sole basis for treatment or other  patient management decisions.  A negative result may occur with  improper specimen collection / handling, submission of specimen other  than nasopharyngeal  swab, presence of viral mutation(s) within the  areas targeted by this assay, and inadequate number of viral copies  (<250 copies / mL). A negative result must be combined with clinical  observations, patient history, and epidemiological information. If result is POSITIVE SARS-CoV-2 target nucleic acids are DETECTED. The SARS-CoV-2 RNA is generally detectable in upper and lower  respiratory specimens dur ing the acute phase of infection.  Positive  results are indicative of active infection with SARS-CoV-2.  Clinical  correlation with patient history and other diagnostic information is  necessary to determine patient infection status.  Positive results do  not rule out bacterial infection or co-infection with other viruses. If result is PRESUMPTIVE POSTIVE SARS-CoV-2 nucleic acids MAY BE PRESENT.   A presumptive positive result was obtained on the submitted specimen  and confirmed on repeat testing.  While 2019 novel coronavirus  (SARS-CoV-2) nucleic acids may be present in the submitted sample  additional confirmatory testing may be necessary for epidemiological  and / or clinical management purposes  to differentiate between  SARS-CoV-2 and other Sarbecovirus currently known to infect humans.  If clinically indicated additional testing with an alternate test  methodology [phone]) is advised. The SARS-CoV-2 RNA is generally  detectable in upper and lower respiratory sp ecimens during the acute  phase of infection. The expected result is Negative. Fact Sheet for Patients:  StrictlyIdeas.no Fact Sheet for Healthcare Providers: BankingDealers.co.za This test is not yet approved or cleared by the [place] FDA and has been authorized for detection and/or diagnosis of SARS-CoV-2 by FDA under an Emergency Use Authorization (EUA).  This EUA will remain in effect (meaning this test can be used) for the duration of the COVID-19 declaration  under Section 564(b)(1) of the Act, 21 U.S.C. section 360bbb-3(b)(1), unless the authorization is terminated or revoked sooner. Performed at [facility], [place] [address], [place], [place] [address]   Culture, blood (routine x 2)     Status: None   Collection Time: 07/23/19 10:41 PM   Specimen: BLOOD LEFT HAND  Result Value Ref Range Status   Specimen Description BLOOD LEFT HAND  Final   Special Requests   Final    AEROBIC BOTTLE ONLY Blood Culture results may not be optimal due to an inadequate volume of blood received in culture bottles   Culture   Final    NO GROWTH 5 DAYS Performed at [facility], [place] [address] [place], [place] [address]    Report Status 07/29/2019 FINAL  Final  Culture, blood (routine x 2)  Status: None   Collection Time: 07/23/19 10:50 PM   Specimen: BLOOD  Result Value Ref Range Status   Specimen Description BLOOD LEFT ANTECUBITAL  Final   Special Requests   Final    AEROBIC BOTTLE ONLY Blood Culture results may not be optimal due to an inadequate volume of blood received in culture bottles   Culture   Final    NO GROWTH 5 DAYS Performed at [facility], [place] [address], [place], [place] [address]    Report Status 07/29/2019 FINAL  Final  MRSA PCR Screening     Status: None   Collection Time: 07/24/19  2:10 AM   Specimen: Nasal Mucosa; Nasopharyngeal  Result Value Ref Range Status   MRSA by PCR NEGATIVE NEGATIVE Final    Comment: Performed at [facility], [place] [address], [place], [place] [address]  Culture, Urine     Status: Abnormal   Collection Time: 07/25/19  1:39 PM   Specimen: Urine, Random  Result Value Ref Range Status   Specimen Description URINE, RANDOM  Final   Special Requests   Final    NONE Performed at [facility], [place] [address] [place], [place] [address]    Culture MULTIPLE SPECIES PRESENT, SUGGEST RECOLLECTION (A)  Final   Report Status 07/26/2019 FINAL  Final  Culture, blood (Routine X 2) w  Reflex to ID Panel     Status: None (Preliminary result)   Collection Time: 07/28/19 12:11 PM   Specimen: BLOOD LEFT ARM  Result Value Ref Range Status   Specimen Description BLOOD LEFT ARM  Final   Special Requests   Final    BOTTLES DRAWN AEROBIC AND ANAEROBIC Blood Culture adequate volume   Culture   Final    NO GROWTH 4 DAYS Performed at [facility], [place] [address] [place], [place] [address]    Report Status PENDING  Incomplete  Culture, blood (Routine X 2) w Reflex to ID Panel     Status: None (Preliminary result)   Collection Time: 07/28/19 12:12 PM   Specimen: BLOOD LEFT ARM  Result Value Ref Range Status   Specimen Description BLOOD LEFT ARM  Final   Special Requests   Final    BOTTLES DRAWN AEROBIC ONLY Blood Culture adequate volume   Culture   Final    NO GROWTH 4 DAYS Performed at [facility], [place] [address], [place], [place] [address]    Report Status PENDING  Incomplete         Radiology Studies: No results found.      Scheduled Meds:   stroke: mapping our early stages of recovery book   Does not apply Once   amLODipine  10 mg Oral Daily   aspirin  300 mg Rectal Daily   Or   aspirin  325 mg Oral Daily   chlorhexidine gluconate (MEDLINE KIT)  15 mL Mouth Rinse BID   cyanocobalamin  1,000 mcg Intramuscular G92 days   folic acid  1 mg Intravenous Daily   heparin  5,000 Units Subcutaneous Q8H   insulin aspart  0-15 Units Subcutaneous TID WC   insulin aspart  0-5 Units Subcutaneous QHS   lactulose  20 g Oral BID   Or   lactulose  300 mL Rectal BID   mouth rinse  15 mL Mouth Rinse 10 times per day   metoprolol tartrate  25 mg Oral BID   multivitamin  15 mL Oral Daily  thiamine  100 mg Oral Daily   Or   thiamine  100 mg Intravenous Daily   Continuous Infusions:  sodium chloride 100 mL/hr at 08/01/19 1459     LOS: 10 days        Aline August, MD [facility] 08/02/2019, 7:34 AM

## 2019-08-02 NOTE — Progress Notes (Addendum)
Occupational Therapy Treatment Patient Details Name: Tyler [name] Liebert MRN: [id number] DOB: 11/03/1971 Today's Date: 08/02/2019    History of present illness Pt is a 48 y/o male transferred from [place] [facility] for unresponsiveness, likely secondary to substance abuse. Pt with rhabdomyolysis and AKI. EEG revealed moderate diffuse encephalopathy. PMH includes polysubstance abuse and HTN.    OT comments  Pt with limited progress towards acute OT goals. Pt with eyes open more often than not but not tracking or responding either verbally nor with body communication. AAROM/PROM of UE/LEs and rolling at bed level to attempt to illicit engagement during session. Attempted HOH grooming tasks of washing face which pt was able to take over with briefly before 'freezing' in place with assistance need to come out of face washing position. D/c plan remains appropriate.   Follow Up Recommendations  SNF;Supervision/Assistance - 24 hour    Equipment Recommendations  None recommended by OT    Recommendations for Other Services      Precautions / Restrictions Precautions Precautions: Fall Restrictions Weight Bearing Restrictions: No       Mobility Bed Mobility Overal bed mobility: Needs Assistance Bed Mobility: Rolling Rolling: Mod assist;Max assist         General bed mobility comments: multimodal cues and use of bed pad needed; pt with limited engagement  Transfers                 General transfer comment: did not attempt    Balance Overall balance assessment: Needs assistance                                         ADL either performed or assessed with clinical judgement   ADL Overall ADL's : Needs assistance/impaired     Grooming: Maximal assistance;Total assistance;Bed level;Wash/dry face Grooming Details (indicate cue type and reason): hand over hand assist to initiate task. Pt wiped eyes min guard assist then "froze" in place. Assist to move pt out of  this position (hand on washcloth over face).                                     Vision   Vision Assessment?: Vision impaired- to be further tested in functional context Additional Comments: continue to assess. does not track, eyes open but limited blinking   Perception     Praxis      Cognition Arousal/Alertness: Awake/alert;Lethargic Behavior During Therapy: Flat affect Overall Cognitive Status: No family/caregiver present to determine baseline cognitive functioning                                 General Comments: No verbalizations or indications of response with basic questions. Eyes open but largely unmoving. Not tracking therapist in room despite multimodal cues. Not following even one step commands.         Exercises     Shoulder Instructions       General Comments      Pertinent Vitals/ Pain       Pain Assessment: Faces Faces Pain Scale: No hurt Pain Location: no visual indication of pain, not respoding when directly asked about pain Pain Intervention(s): Monitored during session  Home Living  Prior Functioning/Environment              Frequency  Min 2X/week        Progress Toward Goals  OT Goals(current goals can now be found in the care plan section)  Progress towards OT goals: Progressing toward goals(flat/limited progress)  Acute Rehab OT Goals Patient Stated Goal: none stated Time For Goal Achievement: 08/02/19 Potential to Achieve Goals: Good ADL Goals Pt Will Perform Grooming: with min assist;sitting Pt Will Perform Upper Body Bathing: with mod assist Pt Will Perform Lower Body Bathing: with mod assist Pt Will Perform Upper Body Dressing: with mod assist Pt Will Perform Lower Body Dressing: with mod assist Pt Will Transfer to Toilet: with mod assist;ambulating;regular height toilet;grab bars Pt Will Perform Toileting - Clothing Manipulation and  hygiene: with mod assist;sit to/from stand Pt Will Perform Tub/Shower Transfer: with mod assist;ambulating;shower seat;3 in 1;grab bars  Plan Discharge plan remains appropriate;Frequency remains appropriate    Co-evaluation                 AM-PAC OT "6 Clicks" Daily Activity     Outcome Measure   Help from another person eating meals?: A Lot Help from another person taking care of personal grooming?: A Lot Help from another person toileting, which includes using toliet, bedpan, or urinal?: Total Help from another person bathing (including washing, rinsing, drying)?: Total Help from another person to put on and taking off regular upper body clothing?: Total Help from another person to put on and taking off regular lower body clothing?: Total 6 Click Score: 8    End of Session  wrist restraints reapplied, bed alarm set, call button in reach, NT present  OT Visit Diagnosis: Other abnormalities of gait and mobility (R26.89);Other symptoms and signs involving cognitive function;Other symptoms and signs involving the nervous system (R29.898);Muscle weakness (generalized) (M62.81)   Activity Tolerance     Patient Left     Nurse Communication          Time: 7654-6503 OT Time Calculation (min): 22 min  Charges: OT General Charges $OT Visit: 1 Visit OT Treatments $Self Care/Home Management : 8-22 mins  Tyler Schimke, OT Acute Rehabilitation Services Pager: [phone] Office: [phone]    Hortencia Pilar 08/02/2019, 11:58 AM

## 2019-08-02 NOTE — Plan of Care (Signed)
  Problem: Coping: Goal: Level of anxiety will decrease Outcome: Not Progressing   Problem: Clinical Measurements: Goal: Cardiovascular complication will be avoided Outcome: Progressing   Problem: Clinical Measurements: Goal: Ability to maintain clinical measurements within normal limits will improve Outcome: Not Progressing

## 2019-08-02 NOTE — Progress Notes (Signed)
STROKE TEAM PROGRESS NOTE   INTERVAL HISTORY Pt lying in bed, able to speak more words with request. He is able to say "I do not know" when asked about his age. He was able to say last name but not first name. However, still largely not following commands, not tracking. Moving all extremities equally, off restrains. Much calmer, had MRA head which is normal.    Vitals:   08/02/19 0755 08/02/19 1034 08/02/19 1142 08/02/19 1356  BP: (!) 161/105 (!) 194/102 (!) 167/103 (!) 149/80  Pulse: 82  74 70  Resp: 16   19  Temp: 97.7 F (36.5 C)  98.4 F (36.9 C)   TempSrc: Oral  Oral   SpO2: 100%  100%   Weight:        CBC:  Recent Labs  Lab 07/29/19 0403 08/02/19 0414  WBC 9.5 11.9*  NEUTROABS  --  7.6  HGB 12.2* 10.1*  HCT 37.6* 32.1*  MCV 88.7 93.6  PLT 148* 214    Basic Metabolic Panel:  Recent Labs  Lab 08/01/19 0356 08/02/19 0414  NA 145 141  K 4.0 3.8  CL 113* 110  CO2 20* 20*  GLUCOSE 87 81  BUN 44* 41*  CREATININE 2.26* 2.02*  CALCIUM 8.9 8.7*  MG 1.8 1.6*   Lipid Panel:     Component Value Date/Time   CHOL 150 07/27/2019 0331   TRIG 239 (H) 07/27/2019 0331   HDL 19 (L) 07/27/2019 0331   CHOLHDL 7.9 07/27/2019 0331   VLDL 48 (H) 07/27/2019 0331   LDLCALC 83 07/27/2019 0331   HgbA1c:  Lab Results  Component Value Date   HGBA1C 5.1 07/27/2019   Urine Drug Screen:     Component Value Date/Time   LABOPIA NONE DETECTED 07/28/2019 0951   COCAINSCRNUR NONE DETECTED 07/28/2019 0951   LABBENZ POSITIVE (A) 07/28/2019 0951   AMPHETMU POSITIVE (A) 07/28/2019 0951   THCU NONE DETECTED 07/28/2019 0951   LABBARB NONE DETECTED 07/28/2019 0951    Alcohol Level     Component Value Date/Time   ETH 127 (H) 04/20/2012 1839    IMAGING past 24h Mr Angio Head Wo Contrast  Result Date: 08/02/2019 CLINICAL DATA:  Stroke follow-up. Acute bilateral cerebral and cerebellar infarcts on MRI. EXAM: MRA HEAD WITHOUT CONTRAST TECHNIQUE: Angiographic images of the Circle of  [place] were obtained using MRA technique without intravenous contrast. COMPARISON:  None. FINDINGS: The study is mildly motion degraded. The visualized distal vertebral arteries are widely patent to the basilar with the right being mildly dominant. Patent PICAs and SCAs are seen bilaterally. The basilar artery is widely patent. Posterior communicating arteries are not identified and may be diminutive or absent. PCAs are patent without evidence of significant stenosis. The internal carotid arteries are widely patent from skull base to carotid termini. ACAs and MCAs are patent without evidence of proximal branch occlusion or significant proximal stenosis within limitations of mild motion artifact. No aneurysm is identified. IMPRESSION: Negative head MRA. Electronically Signed   By: Sebastian Ache M.D.   On: 08/02/2019 10:12   Vas US Carotid (at [facility]) 07/27/2019 Summary:  Right Carotid: Velocities in the right ICA are consistent with a 1-39% stenosis.  Left Carotid: Velocities in the left ICA are consistent with a 1-39% stenosis.  Vertebrals:  Bilateral vertebral arteries demonstrate antegrade flow.  Subclavians: Normal flow hemodynamics were seen in bilateral subclavian arteries.    PHYSICAL EXAM    Frail malnourished looking middle-aged Caucasian male  not in distress but mildly agitated with stimulation. Afebrile. Head is nontraumatic. Neck is supple without bruit.  Cardiac exam no murmur or gallop. Lungs are clear to auscultation. Distal pulses are well felt.  Neurological Exam :  Patient more awake alert, eyes open spontaneously, but is still not following commands, not cooperative on exam. Less dysarthria as yesterday, able to say "I don't know" when ask his age, tell his last name, severe dysarthria but no other meaningful words. Not consistently blinking to visual threat, limited tracking bilaterally. PERRL. There is no obvious facial weakness.  Tongue midline in mouth.  He is able to move  all 4 extremities spontaneously against gravity and there does not appear to be focal weakness. Sensation, coordination and gait not tested.   ASSESSMENT/PLAN Tyler [name] is a 48 y.o. male with history of alcohol and drug use found unresponsive with rhabdo and AKI. MRI done for altered mental status shows multifocal patchy infarcts throughout the brain. Transfer[facility] from [facility]   Stroke: Patchy B anterior and posterior infarcts embolic, embolic pattern, could be due to severe dehydration, AKI in the setting of small vessel disease. However, cardioembolic source cannot rule out   Resultant - pt still agitation on restrain, not following commands  MRI  Patchy acute B cerebral and cerebellar infarcts. R temporal lobe encephalomalacia and chronic cerebral white matter disease   MRA head negative  Carotid Doppler unremarkable  2D Echo EF 60-65%  LE venous doppler no DVT  EEG no sz, diffuse encephalopathy  May consider 30 day cardiac event monitoring as outpt to rule out afib if pt neuro improves.   LDL 83  HgbA1c 5.1  UDS - positive for amphetamines and benzodiazepine  Blood culture NGTD  Heparin 5000 units sq tid for VTE prophylaxis  No antithrombotic prior to admission, now on aspirin 325 mg daily . Continue ASA on discharge.    Therapy recommendations:  SNF  Disposition:  pending   Metabolic encephalopathy  Likely due to rhabodo, AKI, elevated CK, LFTs and ammonia as well as alcohol withdraw  Treatment per primary team  EEG no seizure  Continue current manatement  Rhabdomyolysis AKI Elevated AST/ALT  Found down at home   Cre 5.79->5.42->4.18->3.30->2.36->2.59->2.26->2.02  Limited po intake due to mental status  Continue IVF @ 100cc/h  CK and AST/ALT continues to improve  Continue monitoring   Elevated ammonia  On lactulose  Ammonia 55->21->42->34  Continue monitoring  Alcohol abuse  On CIWA protocol  On B1/FA/MVI  Seizure  precautions  Hypertensive Emergency  BP as high as 237/138  Stable but on the higher end  Continue amlodipine 10mg   On metoprolol 25mg  bid . gradually normalize in 5-7 days . Long-term BP goal normotensive  Hyperlipidemia  Home meds:  No statin  LDL 83, goal < 70  No statin for now given transaminitis  Consider to add lipitor 20 once LFT normalizes.   UTI, resolved  UA WBC  50  Leukocytosis resolved  Blood culture NGTD x 5d  Off rocephin  Dysphagia   Decrease po intake  Not taking much po  Na 142->146->145->141  Continue IVF on 1/2NS @ 100  Tobacco abuse  Current smoker  Smoking cessation counseling will be provided  Other Stroke Risk Factors  Hx of drug abuse. UDS positive for cocaine in 2013  Other Active Problems  leukocytopsis WBC 12.8->11.8->8.5->9.5->11.9  Hypokalemia - K 2.6 ->3.2 ->2.8->4.0->3.8  UTI tx w/ 5d course rocephin  Hospital day # 10  Neurology will  sign off. Please call with questions. [facility]. Thanks for the consult.   Marvel PlanJindong Alter Moss, MD PhD Stroke Neurology 08/02/2019 4:16 PM  To contact Stroke Continuity provider, please refer to [email or web address]. After hours, contact General Neurology

## 2019-08-02 NOTE — Plan of Care (Signed)
  Problem: Clinical Measurements: Goal: Ability to maintain clinical measurements within normal limits will improve Outcome: Not Progressing   Problem: Clinical Measurements: Goal: Cardiovascular complication will be avoided Outcome: Progressing   Problem: Coping: Goal: Level of anxiety will decrease Outcome: Not Progressing

## 2019-08-03 LAB — CBC WITH DIFFERENTIAL/PLATELET
Abs Immature Granulocytes: 0.29 10*3/uL — ABNORMAL HIGH (ref 0.00–0.07)
Basophils Absolute: 0.1 10*3/uL (ref 0.0–0.1)
Basophils Relative: 1 %
Eosinophils Absolute: 0.2 10*3/uL (ref 0.0–0.5)
Eosinophils Relative: 2 %
HCT: 31.2 % — ABNORMAL LOW (ref 39.0–52.0)
Hemoglobin: 9.7 g/dL — ABNORMAL LOW (ref 13.0–17.0)
Immature Granulocytes: 3 %
Lymphocytes Relative: 21 %
Lymphs Abs: 2.2 10*3/uL (ref 0.7–4.0)
MCH: 29.9 pg (ref 26.0–34.0)
MCHC: 31.1 g/dL (ref 30.0–36.0)
MCV: 96.3 fL (ref 80.0–100.0)
Monocytes Absolute: 0.9 10*3/uL (ref 0.1–1.0)
Monocytes Relative: 9 %
Neutro Abs: 6.7 10*3/uL (ref 1.7–7.7)
Neutrophils Relative %: 64 %
Platelets: 212 10*3/uL (ref 150–400)
RBC: 3.24 MIL/uL — ABNORMAL LOW (ref 4.22–5.81)
RDW: 15.9 % — ABNORMAL HIGH (ref 11.5–15.5)
WBC: 10.4 10*3/uL (ref 4.0–10.5)
nRBC: 0 % (ref 0.0–0.2)

## 2019-08-03 LAB — COMPREHENSIVE METABOLIC PANEL
ALT: 84 U/L — ABNORMAL HIGH (ref 0–44)
AST: 85 U/L — ABNORMAL HIGH (ref 15–41)
Albumin: 2.6 g/dL — ABNORMAL LOW (ref 3.5–5.0)
Alkaline Phosphatase: 70 U/L (ref 38–126)
Anion gap: 11 (ref 5–15)
BUN: 36 mg/dL — ABNORMAL HIGH (ref 6–20)
CO2: 19 mmol/L — ABNORMAL LOW (ref 22–32)
Calcium: 8.6 mg/dL — ABNORMAL LOW (ref 8.9–10.3)
Chloride: 109 mmol/L (ref 98–111)
Creatinine, Ser: 1.89 mg/dL — ABNORMAL HIGH (ref 0.61–1.24)
GFR calc Af Amer: 47 mL/min — ABNORMAL LOW (ref >60)
GFR calc non Af Amer: 40 mL/min — ABNORMAL LOW (ref >60)
Glucose, Bld: 85 mg/dL (ref 70–99)
Potassium: 3.5 mmol/L (ref 3.5–5.1)
Sodium: 139 mmol/L (ref 135–145)
Total Bilirubin: 3.1 mg/dL — ABNORMAL HIGH (ref 0.3–1.2)
Total Protein: 6.2 g/dL — ABNORMAL LOW (ref 6.5–8.1)

## 2019-08-03 LAB — GLUCOSE, CAPILLARY
Glucose-Capillary: 74 mg/dL (ref 70–99)
Glucose-Capillary: 114 mg/dL — ABNORMAL HIGH (ref 70–99)
Glucose-Capillary: 72 mg/dL (ref 70–99)
Glucose-Capillary: 88 mg/dL (ref 70–99)

## 2019-08-03 LAB — MAGNESIUM: Magnesium: 1.5 mg/dL — ABNORMAL LOW (ref 1.7–2.4)

## 2019-08-03 MED ORDER — MAGNESIUM SULFATE 2 GM/50ML IV SOLN
2.0000 g | Freq: Once | INTRAVENOUS | Status: AC
  Administered 2019-08-03: 2 g via INTRAVENOUS
  Filled 2019-08-03: qty 50

## 2019-08-03 NOTE — Progress Notes (Signed)
Patient ID: Tyler [name], male   DOB: [birth year], 48 y.o.   MRN: [id number]  PROGRESS NOTE    Tyler [name]  EHO:[id number] DOB: [birth year] DOA: 07/23/2019 PCP: Patient, No Pcp Per   Brief Narrative:  48 year old male with history of polysubstance abuse including heroin presented to [facility] ER after being found down with a syringe nearby.  He was given Narcan with minimal improvement.  Found in severe rhabdomyolysis with acute kidney injury and hyperkalemia.  He was started on IV fluids including bicarb drip and transferred to [facility] for further care.  Rhabdomyolysis improved but he had persistence of encephalopathy and was found to have acute bilateral cerebellar infarct.  Neurology was consulted.   Assessment & Plan:  Acute metabolic encephalopathy: Multifactorial -Most likely secondary to drug use, uremia, heavy alcohol use -Very slow to improve -CT of the head and neck was initially negative for any traumatic finding.  UDS was negative.  Tylenol and salicylate levels were negative. -TSH, folate, B12 levels -EEG: Generalized diffuse slowing but no seizure activity -Has required restraints intermittently.  Off restraints this morning. -Continue to monitor mental status.  Fall precautions.  Acute bilateral CVA, nonhemorrhagic -MRI of the brain showed acute CVA.  No focal neurologic deficits -Neurology evaluated the patient and has signed off.  MRA of the head negative.  Ultrasound carotid showed bilateral 1 to 39% ICA stenosis.  Neurology recommends considering 30-day cardiac event monitoring as outpatient to rule out A. fib if patient neurologically improves. -Continue aspirin.  LDL 83; A1c 5.1.  Will start statin once LFTs normalized. -Echocardiogram: EF of 60 to 65% -Lower extremity Dopplers: Negative for DVT -Follow PT/OT/SLP recommendations.  PT recommends SNF.  Severe rhabdomyolysis Acute kidney injury -Significantly improved.  Renal ultrasound negative -Creatinine peaked  to 5.79.  Creatinine improving: Creatinine 1.89 today.  Continues to make urine.  Continue strict input and output.  Continue IV fluids at 50 cc an hour.  Monitor creatinine  Hypomagnesemia-replace.  Repeat a.m. labs  Transaminitis with elevated bilirubin -Improving.  Trend LFTs.  Right upper quadrant ultrasound showed hepatic steatosis with early cirrhosis with some gallbladder thickening.  Alcohol use per history -Patient unable to tell me if he drinks alcohol daily or not.  Continue withdrawal protocol.  Continue thiamine, multivitamin and folic acid.  Leukocytosis -Resolved  Urinary tract infection: Present on admission -Resolved.  Treated with 5 days of Rocephin  Essential hypertension--monitor blood pressure.  Still on the higher side.  Continue amlodipine and metoprolol for now.  DVT prophylaxis: Heparin Code Status: Full Family Communication: None at bedside Disposition Plan: SNF in 1 to 2 days once clinically stable  Consultants: Neurology  Procedures:  Echo IMPRESSIONS    1. The left ventricle has normal systolic function with an ejection fraction of 60-65%. The cavity size was normal. Left ventricular diastolic Doppler parameters are consistent with impaired relaxation. No evidence of left ventricular regional wall  motion abnormalities.  2. The right ventricle has normal systolic function. The cavity was normal. There is no increase in right ventricular wall thickness. Right ventricular systolic pressure is normal with an estimated pressure of 21.0 mmHg.  3. The aortic valve is tricuspid. Moderate sclerosis of the aortic valve.  4. The aorta is normal unless otherwise noted.  Antimicrobials:  Anti-infectives (From admission, onward)   Start     Dose/Rate Route Frequency Ordered Stop   07/25/19 0745  cefTRIAXone (ROCEPHIN) 1 g in sodium chloride 0.9 % 100 mL IVPB     1  g 200 mL/hr over 30 Minutes Intravenous Every 24 hours 07/25/19 0734 07/29/19 1518        Subjective: Patient seen and examined at bedside.  He is a poor historian.  He is awake but hardly answers any questions.  No overnight fever or vomiting reported by nursing staff.   Objective: Vitals:   08/02/19 1618 08/02/19 2146 08/03/19 0042 08/03/19 0617  BP: (!) 131/92 (!) 154/82 (!) 138/97 (!) 169/98  Pulse: 73 79 69 91  Resp:      Temp: 98.4 F (36.9 C) 98 F (36.7 C) 97.7 F (36.5 C) 97.7 F (36.5 C)  TempSrc: Oral Oral Oral Oral  SpO2: 100% 100% 100% 100%  Weight:        Intake/Output Summary (Last 24 hours) at 08/03/2019 0735 Last data filed at 08/03/2019 0400 Gross per 24 hour  Intake 6124.09 ml  Output 1200 ml  Net 4924.09 ml   Filed Weights   07/24/19 0624 08/02/19 0426  Weight: 73 kg 67.8 kg    Examination:  General exam: No distress.  Awake but does not answer questions.   Respiratory system: Bilateral decreased breath sounds at bases with some crackles.  No wheezing  cardiovascular system: S1-S2 heard, rate controlled gastrointestinal system: Abdomen is nondistended, soft and nontender. Normal bowel sounds heard. Extremities: No cyanosis, edema    Data Reviewed: I have personally reviewed following labs and imaging studies  CBC: Recent Labs  Lab 07/28/19 0246 07/29/19 0403 08/02/19 0414 08/03/19 0422  WBC 8.5 9.5 11.9* 10.4  NEUTROABS  --   --  7.6 PENDING  HGB 12.2* 12.2* 10.1* 9.7*  HCT 37.9* 37.6* 32.1* 31.2*  MCV 89.4 88.7 93.6 96.3  PLT 171 148* 214 170   Basic Metabolic Panel: Recent Labs  Lab 07/30/19 0900 07/30/19 1101 07/31/19 0314 08/01/19 0356 08/02/19 0414 08/03/19 0422  NA 146*  --  145 145 141 139  K 2.8*  --  4.0 4.0 3.8 3.5  CL 118*  --  115* 113* 110 109  CO2 21*  --  23 20* 20* 19*  GLUCOSE 565* 102* 116* 87 81 85  BUN 39*  --  46* 44* 41* 36*  CREATININE 2.36*  --  2.59* 2.26* 2.02* 1.89*  CALCIUM 7.7*  --  8.9 8.9 8.7* 8.6*  MG 1.6*  --  2.3 1.8 1.6* 1.5*   GFR: CrCl cannot be calculated (Unknown ideal  weight.). Liver Function Tests: Recent Labs  Lab 07/30/19 0900 07/31/19 0314 08/01/19 0356 08/02/19 0414 08/03/19 0422  AST 129* 115* 92* 85* 85*  ALT 151* 140* 114* 93* 84*  ALKPHOS 65 74 76 68 70  BILITOT 3.6* 3.6* 3.4* 2.8* 3.1*  PROT 5.4* 6.1* 6.3* 6.1* 6.2*  ALBUMIN 2.4* 2.6* 2.8* 2.6* 2.6*   No results for input(s): LIPASE, AMYLASE in the last 168 hours. Recent Labs  Lab 07/30/19 0349  AMMONIA 34   Coagulation Profile: No results for input(s): INR, PROTIME in the last 168 hours. Cardiac Enzymes: Recent Labs  Lab 07/29/19 0403 07/30/19 0349 07/31/19 0314 08/01/19 0356 08/02/19 0414  CKTOTAL 4,700* 1,650* 543* 293 190   BNP (last 3 results) No results for input(s): PROBNP in the last 8760 hours. HbA1C: No results for input(s): HGBA1C in the last 72 hours. CBG: Recent Labs  Lab 08/01/19 2211 08/02/19 0652 08/02/19 0752 08/02/19 1138 08/02/19 1615  GLUCAP 83 84 83 81 89   Lipid Profile: No results for input(s): CHOL, HDL, LDLCALC, TRIG, CHOLHDL, LDLDIRECT in  the last 72 hours. Thyroid Function Tests: No results for input(s): TSH, T4TOTAL, FREET4, T3FREE, THYROIDAB in the last 72 hours. Anemia Panel: No results for input(s): VITAMINB12, FOLATE, FERRITIN, TIBC, IRON, RETICCTPCT in the last 72 hours. Sepsis Labs: No results for input(s): PROCALCITON, LATICACIDVEN in the last 168 hours.  Recent Results (from the past 240 hour(s))  Culture, Urine     Status: Abnormal   Collection Time: 07/25/19  1:39 PM   Specimen: Urine, Random  Result Value Ref Range Status   Specimen Description URINE, RANDOM  Final   Special Requests   Final    NONE Performed at [facility], [address] [place], [place] [address]    Culture MULTIPLE SPECIES PRESENT, SUGGEST RECOLLECTION (A)  Final   Report Status 07/26/2019 FINAL  Final  Culture, blood (Routine X 2) w Reflex to ID Panel     Status: None   Collection Time: 07/28/19 12:11 PM   Specimen: BLOOD LEFT ARM   Result Value Ref Range Status   Specimen Description BLOOD LEFT ARM  Final   Special Requests   Final    BOTTLES DRAWN AEROBIC AND ANAEROBIC Blood Culture adequate volume   Culture   Final    NO GROWTH 5 DAYS Performed at [facility], [place] [address] [place], [place] [address]    Report Status 08/02/2019 FINAL  Final  Culture, blood (Routine X 2) w Reflex to ID Panel     Status: None   Collection Time: 07/28/19 12:12 PM   Specimen: BLOOD LEFT ARM  Result Value Ref Range Status   Specimen Description BLOOD LEFT ARM  Final   Special Requests   Final    BOTTLES DRAWN AEROBIC ONLY Blood Culture adequate volume   Culture   Final    NO GROWTH 5 DAYS Performed at [facility], [place] [address] [place], [place] [address]    Report Status 08/02/2019 FINAL  Final         Radiology Studies: Mr Angio Head Wo Contrast  Result Date: 08/02/2019 CLINICAL DATA:  Stroke follow-up. Acute bilateral cerebral and cerebellar infarcts on MRI. EXAM: MRA HEAD WITHOUT CONTRAST TECHNIQUE: Angiographic images of the Circle of Willis were obtained using MRA technique without intravenous contrast. COMPARISON:  None. FINDINGS: The study is mildly motion degraded. The visualized distal vertebral arteries are widely patent to the basilar with the right being mildly dominant. Patent PICAs and SCAs are seen bilaterally. The basilar artery is widely patent. Posterior communicating arteries are not identified and may be diminutive or absent. PCAs are patent without evidence of significant stenosis. The internal carotid arteries are widely patent from skull base to carotid termini. ACAs and MCAs are patent without evidence of proximal branch occlusion or significant proximal stenosis within limitations of mild motion artifact. No aneurysm is identified. IMPRESSION: Negative head MRA. Electronically Signed   By: Logan Bores M.D.   On: 08/02/2019 10:12        Scheduled Meds: .  stroke: mapping our early  stages of recovery book   Does not apply Once  . amLODipine  10 mg Oral Daily  . aspirin  300 mg Rectal Daily   Or  . aspirin  325 mg Oral Daily  . chlorhexidine gluconate (MEDLINE KIT)  15 mL Mouth Rinse BID  . cyanocobalamin  1,000 mcg Intramuscular Daily  . folic acid  1 mg Intravenous Daily  . heparin  5,000 Units Subcutaneous Q8H  . insulin aspart  0-15 Units Subcutaneous TID WC  . insulin aspart  0-5 Units Subcutaneous QHS  . lactulose  20 g Oral BID   Or  . lactulose  300 mL Rectal BID  . mouth rinse  15 mL Mouth Rinse 10 times per day  . metoprolol tartrate  25 mg Oral BID  . multivitamin  15 mL Oral Daily  . thiamine  100 mg Oral Daily   Or  . thiamine  100 mg Intravenous Daily   Continuous Infusions: . sodium chloride 75 mL/hr at 08/02/19 1046     LOS: 11 days        Aline August, MD [facility] 08/03/2019, 7:35 AM

## 2019-08-03 NOTE — TOC Initial Note (Addendum)
Transition of Care [facility]) - Initial/Assessment Note    Patient Details  Name: Tyler [name] MRN: [id number] Date of Birth: 05-22-1971  [facility]) CM/SW Contact:    Sharin Mons, RN Phone Number: 08/03/2019, 10:27 AM  Clinical Narrative:     Presented with Acute metabolic encephalopathy.             RNCM received consult for possible SNF placement at time of discharge. RNCM spoke with patient's daughter, Tyler [name] regarding PT recommendation of SNF placement at time of discharge. Tyler [name] reports that dad is currently unable to care for self  at his place of residence given patient's current physical needs and fall risk. Tyler [name] states PTA dad lived with his son Tyler [name] which living conditions were really bad, bed bugs, mole and dirty well water.  Daughter expressed understanding of PT recommendation and is agreeable to SNF placement at time of discharge. Pt with hx of poly substance abuse .... ? Difficult to place.... Pt without insurance. Pt will require a LOG for SNF placement. NCM to f/u with SNF's for LOG bed availability. RNCM left voice message with financial counselor regarding medicaid application process. No further questions reported at this time. RNCM to continue to follow and assist with discharge planning needs.     Expected Discharge Plan: Skilled Nursing Facility Barriers to Discharge: Continued Medical Work up   Patient Goals and CMS Choice        Expected Discharge Plan and Services Expected Discharge Plan: [place]   Discharge Planning Services: CM Consult, Other - See comment(Pt will need LOG for SNF placement)   Living arrangements for the past 2 months: Single Family Home                                      Prior Living Arrangements/Services Living arrangements for the past 2 months: Single Family Home Lives with:: Adult Children(Tyler [name], son) Patient language and need for interpreter reviewed:: Yes        Need for  Family Participation in Patient Care: Yes (Comment) Care giver support system in place?: No (comment)   Criminal Activity/Legal Involvement Pertinent to Current Situation/Hospitalization: No - Comment as needed  Activities of Daily Living      Permission Sought/Granted Permission sought to share information with : (pt orientation only to self)                Emotional Assessment Appearance:: Appears stated age     Orientation: : Oriented to Self Alcohol / Substance Use: Illicit Drugs(Hx of polysubstance abuse) Psych Involvement: No (comment)  Admission diagnosis:  rhabdomyolysis Patient Active Problem List   Diagnosis Date Noted  . Cerebral embolism with cerebral infarction 07/28/2019  . Rhabdomyolysis 07/23/2019  . Acute kidney failure ([place]) 07/23/2019  . Acute metabolic encephalopathy 08/65/7846  . Polysubstance abuse ([place]) 07/23/2019  . Hyperkalemia 07/23/2019  . Transaminitis 07/23/2019  . HTN (hypertension) 07/23/2019  . Fall 04/21/2012  . Traumatic subdural hematoma ([place]) 04/21/2012  . [place] 04/21/2012  . Left orbit fracture 04/21/2012  . Left maxillary fracture ([place]) 04/21/2012  . Alcohol use 04/21/2012  . Cocaine use 04/21/2012   PCP:  Patient, No Pcp Per Pharmacy:   [place], [place] [address] [place] [address] Phone: [phone] Fax: [phone]  [id number] [place], [address]  [address] [facility] [place] [id number] Phone: [phone] Fax: [phone]     Social Determinants of Health (SDOH) Interventions    Readmission Risk Interventions No flowsheet data found.

## 2019-08-04 LAB — CBC WITH DIFFERENTIAL/PLATELET
Abs Immature Granulocytes: 0.21 10*3/uL — ABNORMAL HIGH (ref 0.00–0.07)
Basophils Absolute: 0.1 10*3/uL (ref 0.0–0.1)
Basophils Relative: 1 %
Eosinophils Absolute: 0.2 10*3/uL (ref 0.0–0.5)
Eosinophils Relative: 2 %
HCT: 31.3 % — ABNORMAL LOW (ref 39.0–52.0)
Hemoglobin: 9.8 g/dL — ABNORMAL LOW (ref 13.0–17.0)
Immature Granulocytes: 2 %
Lymphocytes Relative: 23 %
Lymphs Abs: 2 10*3/uL (ref 0.7–4.0)
MCH: 29.4 pg (ref 26.0–34.0)
MCHC: 31.3 g/dL (ref 30.0–36.0)
MCV: 94 fL (ref 80.0–100.0)
Monocytes Absolute: 0.8 10*3/uL (ref 0.1–1.0)
Monocytes Relative: 10 %
Neutro Abs: 5.5 10*3/uL (ref 1.7–7.7)
Neutrophils Relative %: 62 %
Platelets: 231 10*3/uL (ref 150–400)
RBC: 3.33 MIL/uL — ABNORMAL LOW (ref 4.22–5.81)
RDW: 15.8 % — ABNORMAL HIGH (ref 11.5–15.5)
WBC: 8.8 10*3/uL (ref 4.0–10.5)
nRBC: 0 % (ref 0.0–0.2)

## 2019-08-04 LAB — COMPREHENSIVE METABOLIC PANEL
ALT: 79 U/L — ABNORMAL HIGH (ref 0–44)
AST: 85 U/L — ABNORMAL HIGH (ref 15–41)
Albumin: 2.6 g/dL — ABNORMAL LOW (ref 3.5–5.0)
Alkaline Phosphatase: 72 U/L (ref 38–126)
Anion gap: 8 (ref 5–15)
BUN: 29 mg/dL — ABNORMAL HIGH (ref 6–20)
CO2: 23 mmol/L (ref 22–32)
Calcium: 8.6 mg/dL — ABNORMAL LOW (ref 8.9–10.3)
Chloride: 110 mmol/L (ref 98–111)
Creatinine, Ser: 1.55 mg/dL — ABNORMAL HIGH (ref 0.61–1.24)
GFR calc Af Amer: 60 mL/min — ABNORMAL LOW (ref >60)
GFR calc non Af Amer: 51 mL/min — ABNORMAL LOW (ref >60)
Glucose, Bld: 85 mg/dL (ref 70–99)
Potassium: 3.2 mmol/L — ABNORMAL LOW (ref 3.5–5.1)
Sodium: 141 mmol/L (ref 135–145)
Total Bilirubin: 2.3 mg/dL — ABNORMAL HIGH (ref 0.3–1.2)
Total Protein: 6.5 g/dL (ref 6.5–8.1)

## 2019-08-04 LAB — GLUCOSE, CAPILLARY
Glucose-Capillary: 79 mg/dL (ref 70–99)
Glucose-Capillary: 97 mg/dL (ref 70–99)
Glucose-Capillary: 81 mg/dL (ref 70–99)
Glucose-Capillary: 82 mg/dL (ref 70–99)

## 2019-08-04 LAB — MAGNESIUM: Magnesium: 1.7 mg/dL (ref 1.7–2.4)

## 2019-08-04 MED ORDER — CHLORHEXIDINE GLUCONATE 0.12 % MT SOLN
OROMUCOSAL | Status: AC
  Administered 2019-08-04: 11:00:00
  Filled 2019-08-04: qty 15

## 2019-08-04 MED ORDER — CHLORHEXIDINE GLUCONATE 0.12 % MT SOLN
OROMUCOSAL | Status: AC
  Administered 2019-08-04: 21:00:00 15 mL via OROMUCOSAL
  Filled 2019-08-04: qty 15

## 2019-08-04 MED ORDER — POTASSIUM CHLORIDE CRYS ER 20 MEQ PO TBCR
40.0000 meq | EXTENDED_RELEASE_TABLET | ORAL | Status: AC
  Administered 2019-08-04: 08:00:00 40 meq via ORAL
  Filled 2019-08-04 (×2): qty 2

## 2019-08-04 MED ORDER — FOLIC ACID 1 MG PO TABS
1.0000 mg | ORAL_TABLET | Freq: Every day | ORAL | Status: DC
  Administered 2019-08-05 – 2019-12-16 (×115): 1 mg via ORAL
  Filled 2019-08-04 (×124): qty 1

## 2019-08-04 NOTE — Progress Notes (Signed)
Patient ID: Tyler [name], male   DOB: [birth year], 48 y.o.   MRN: [id number]  PROGRESS NOTE    Tyler [name]  [id number] DOB: July 25, 1971 DOA: 07/23/2019 PCP: Patient, No Pcp Per   Brief Narrative:  48 year old male with history of polysubstance abuse including heroin presented to [facility] ER after being found down with a syringe nearby.  He was given Narcan with minimal improvement.  Found in severe rhabdomyolysis with acute kidney injury and hyperkalemia.  He was started on IV fluids including bicarb drip and transferred to [facility] for further care.  Rhabdomyolysis improved but he had persistence of encephalopathy and was found to have acute bilateral cerebellar infarct.  Neurology was consulted.   Assessment & Plan:  Acute metabolic encephalopathy: Multifactorial -Most likely secondary to drug use, uremia, heavy alcohol use -Very slow to improve -CT of the head and neck was initially negative for any traumatic finding.  UDS was negative.  Tylenol and salicylate levels were negative. -TSH, folate, B12 levels -EEG: Generalized diffuse slowing but no seizure activity -Has required restraints intermittently.   -Continue to monitor mental status.  Fall precautions.  Acute bilateral CVA, nonhemorrhagic -MRI of the brain showed acute CVA.  No focal neurologic deficits -Neurology evaluated the patient and has signed off.  MRA of the head negative.  Ultrasound carotid showed bilateral 1 to 39% ICA stenosis.  Neurology recommends considering 30-day cardiac event monitoring as outpatient to rule out A. fib if patient neurologically improves. -Continue aspirin.  LDL 83; A1c 5.1.  Will start statin once LFTs normalized. -Echocardiogram: EF of 60 to 65% -Lower extremity Dopplers: Negative for DVT -Follow PT/OT/SLP recommendations.  PT recommends SNF.  Severe rhabdomyolysis Acute kidney injury -Significantly improved.  Renal ultrasound negative -Creatinine peaked to 5.79.  Creatinine  improving: Creatinine 1.55 today.  Continues to make urine.  Continue strict input and output.  DC IV fluids. monitor creatinine  Hypomagnesemia-improved  Hypokalemia-replace.  Repeat a.m. labs  Transaminitis with elevated bilirubin -Improving.  Trend LFTs intermittently.  Right upper quadrant ultrasound showed hepatic steatosis with early cirrhosis with some gallbladder thickening.  Alcohol use per history -Patient unable to tell me if he drinks alcohol daily or not.  Continue withdrawal protocol.  Continue thiamine, multivitamin and folic acid.  Leukocytosis -Resolved  Urinary tract infection: Present on admission -Resolved.  Treated with 5 days of Rocephin  Essential hypertension--monitor blood pressure.  Still on the higher side.  Continue amlodipine and metoprolol for now.  DVT prophylaxis: Heparin Code Status: Full Family Communication: None at bedside Disposition Plan: SNF in 1 to 2 days once clinically stable  Consultants: Neurology  Procedures:  Echo IMPRESSIONS    1. The left ventricle has normal systolic function with an ejection fraction of 60-65%. The cavity size was normal. Left ventricular diastolic Doppler parameters are consistent with impaired relaxation. No evidence of left ventricular regional wall  motion abnormalities.  2. The right ventricle has normal systolic function. The cavity was normal. There is no increase in right ventricular wall thickness. Right ventricular systolic pressure is normal with an estimated pressure of 21.0 mmHg.  3. The aortic valve is tricuspid. Moderate sclerosis of the aortic valve.  4. The aorta is normal unless otherwise noted.  Antimicrobials:  Anti-infectives (From admission, onward)   Start     Dose/Rate Route Frequency Ordered Stop   07/25/19 0745  cefTRIAXone (ROCEPHIN) 1 g in sodium chloride 0.9 % 100 mL IVPB     1 g 200 mL/hr over 30 Minutes  Intravenous Every 24 hours 07/25/19 0734 07/29/19 1518        Subjective: Patient seen and examined at bedside.  He is a poor historian.  No overnight fever or vomiting reported per nursing staff.  Very sleepy, hardly wakes up this morning.  Objective: Vitals:   08/03/19 1153 08/03/19 2123 08/04/19 0325 08/04/19 0459  BP: 140/88 (!) 171/96  (!) 157/82  Pulse: 66 78  70  Resp:      Temp: 98.9 F (37.2 C)   (!) 97.4 F (36.3 C)  TempSrc: Tympanic Axillary  Axillary  SpO2: 97% 100%  100%  Weight:   66.5 kg     Intake/Output Summary (Last 24 hours) at 08/04/2019 0745 Last data filed at 08/03/2019 1518 Gross per 24 hour  Intake 555.6 ml  Output -  Net 555.6 ml   Filed Weights   07/24/19 0624 08/02/19 0426 08/04/19 0325  Weight: 73 kg 67.8 kg 66.5 kg    Examination:  General exam: No acute distress.  Sleepy, hardly wakes up  Respiratory system: Bilateral decreased breath sounds at bases with scattered crackles.   Cardiovascular system: Rate controlled, S1-S2 heard gastrointestinal system: Abdomen is nondistended, soft and nontender. Normal bowel sounds heard. Extremities: No cyanosis, edema    Data Reviewed: I have personally reviewed following labs and imaging studies  CBC: Recent Labs  Lab 07/29/19 0403 08/02/19 0414 08/03/19 0422 08/04/19 0320  WBC 9.5 11.9* 10.4 8.8  NEUTROABS  --  7.6 6.7 5.5  HGB 12.2* 10.1* 9.7* 9.8*  HCT 37.6* 32.1* 31.2* 31.3*  MCV 88.7 93.6 96.3 94.0  PLT 148* 214 212 856   Basic Metabolic Panel: Recent Labs  Lab 07/31/19 0314 08/01/19 0356 08/02/19 0414 08/03/19 0422 08/04/19 0320  NA 145 145 141 139 141  K 4.0 4.0 3.8 3.5 3.2*  CL 115* 113* 110 109 110  CO2 23 20* 20* 19* 23  GLUCOSE 116* 87 81 85 85  BUN 46* 44* 41* 36* 29*  CREATININE 2.59* 2.26* 2.02* 1.89* 1.55*  CALCIUM 8.9 8.9 8.7* 8.6* 8.6*  MG 2.3 1.8 1.6* 1.5* 1.7   GFR: CrCl cannot be calculated (Unknown ideal weight.). Liver Function Tests: Recent Labs  Lab 07/31/19 0314 08/01/19 0356 08/02/19 0414 08/03/19 0422  08/04/19 0320  AST 115* 92* 85* 85* 85*  ALT 140* 114* 93* 84* 79*  ALKPHOS 74 76 68 70 72  BILITOT 3.6* 3.4* 2.8* 3.1* 2.3*  PROT 6.1* 6.3* 6.1* 6.2* 6.5  ALBUMIN 2.6* 2.8* 2.6* 2.6* 2.6*   No results for input(s): LIPASE, AMYLASE in the last 168 hours. Recent Labs  Lab 07/30/19 0349  AMMONIA 34   Coagulation Profile: No results for input(s): INR, PROTIME in the last 168 hours. Cardiac Enzymes: Recent Labs  Lab 07/29/19 0403 07/30/19 0349 07/31/19 0314 08/01/19 0356 08/02/19 0414  CKTOTAL 4,700* 1,650* 543* 293 190   BNP (last 3 results) No results for input(s): PROBNP in the last 8760 hours. HbA1C: No results for input(s): HGBA1C in the last 72 hours. CBG: Recent Labs  Lab 08/02/19 1615 08/03/19 0734 08/03/19 1134 08/03/19 1642 08/03/19 2120  GLUCAP 89 74 114* 72 88   Lipid Profile: No results for input(s): CHOL, HDL, LDLCALC, TRIG, CHOLHDL, LDLDIRECT in the last 72 hours. Thyroid Function Tests: No results for input(s): TSH, T4TOTAL, FREET4, T3FREE, THYROIDAB in the last 72 hours. Anemia Panel: No results for input(s): VITAMINB12, FOLATE, FERRITIN, TIBC, IRON, RETICCTPCT in the last 72 hours. Sepsis Labs: No results for input(s):  PROCALCITON, LATICACIDVEN in the last 168 hours.  Recent Results (from the past 240 hour(s))  Culture, Urine     Status: Abnormal   Collection Time: 07/25/19  1:39 PM   Specimen: Urine, Random  Result Value Ref Range Status   Specimen Description URINE, RANDOM  Final   Special Requests   Final    NONE Performed at [facility], [address] [place], [place] [address]    Culture MULTIPLE SPECIES PRESENT, SUGGEST RECOLLECTION (A)  Final   Report Status 07/26/2019 FINAL  Final  Culture, blood (Routine X 2) w Reflex to ID Panel     Status: None   Collection Time: 07/28/19 12:11 PM   Specimen: BLOOD LEFT ARM  Result Value Ref Range Status   Specimen Description BLOOD LEFT ARM  Final   Special Requests   Final     BOTTLES DRAWN AEROBIC AND ANAEROBIC Blood Culture adequate volume   Culture   Final    NO GROWTH 5 DAYS Performed at [facility], [place] [address], [place], [place] [address]    Report Status 08/02/2019 FINAL  Final  Culture, blood (Routine X 2) w Reflex to ID Panel     Status: None   Collection Time: 07/28/19 12:12 PM   Specimen: BLOOD LEFT ARM  Result Value Ref Range Status   Specimen Description BLOOD LEFT ARM  Final   Special Requests   Final    BOTTLES DRAWN AEROBIC ONLY Blood Culture adequate volume   Culture   Final    NO GROWTH 5 DAYS Performed at [facility], [place] [address], [place], [place] [address]    Report Status 08/02/2019 FINAL  Final         Radiology Studies: Mr Angio Head Wo Contrast  Result Date: 08/02/2019 CLINICAL DATA:  Stroke follow-up. Acute bilateral cerebral and cerebellar infarcts on MRI. EXAM: MRA HEAD WITHOUT CONTRAST TECHNIQUE: Angiographic images of the Circle of Willis were obtained using MRA technique without intravenous contrast. COMPARISON:  None. FINDINGS: The study is mildly motion degraded. The visualized distal vertebral arteries are widely patent to the basilar with the right being mildly dominant. Patent PICAs and SCAs are seen bilaterally. The basilar artery is widely patent. Posterior communicating arteries are not identified and may be diminutive or absent. PCAs are patent without evidence of significant stenosis. The internal carotid arteries are widely patent from skull base to carotid termini. ACAs and MCAs are patent without evidence of proximal branch occlusion or significant proximal stenosis within limitations of mild motion artifact. No aneurysm is identified. IMPRESSION: Negative head MRA. Electronically Signed   By: Logan Bores M.D.   On: 08/02/2019 10:12        Scheduled Meds: .  stroke: mapping our early stages of recovery book   Does not apply Once  . amLODipine  10 mg Oral Daily  . aspirin  300 mg Rectal  Daily   Or  . aspirin  325 mg Oral Daily  . chlorhexidine gluconate (MEDLINE KIT)  15 mL Mouth Rinse BID  . cyanocobalamin  1,000 mcg Intramuscular Daily  . folic acid  1 mg Intravenous Daily  . heparin  5,000 Units Subcutaneous Q8H  . insulin aspart  0-15 Units Subcutaneous TID WC  . insulin aspart  0-5 Units Subcutaneous QHS  . lactulose  20 g Oral BID   Or  . lactulose  300 mL Rectal BID  . mouth rinse  15 mL Mouth Rinse 10  times per day  . metoprolol tartrate  25 mg Oral BID  . multivitamin  15 mL Oral Daily  . thiamine  100 mg Oral Daily   Or  . thiamine  100 mg Intravenous Daily   Continuous Infusions: . sodium chloride 50 mL/hr at 08/03/19 0808     LOS: 12 days        Aline August, MD [facility] 08/04/2019, 7:45 AM

## 2019-08-05 LAB — BASIC METABOLIC PANEL
Anion gap: 10 (ref 5–15)
BUN: 24 mg/dL — ABNORMAL HIGH (ref 6–20)
CO2: 24 mmol/L (ref 22–32)
Calcium: 8.9 mg/dL (ref 8.9–10.3)
Chloride: 108 mmol/L (ref 98–111)
Creatinine, Ser: 1.45 mg/dL — ABNORMAL HIGH (ref 0.61–1.24)
GFR calc Af Amer: >60 mL/min (ref >60)
GFR calc non Af Amer: 56 mL/min — ABNORMAL LOW (ref >60)
Glucose, Bld: 88 mg/dL (ref 70–99)
Potassium: 3.3 mmol/L — ABNORMAL LOW (ref 3.5–5.1)
Sodium: 142 mmol/L (ref 135–145)

## 2019-08-05 LAB — GLUCOSE, CAPILLARY
Glucose-Capillary: 100 mg/dL — ABNORMAL HIGH (ref 70–99)
Glucose-Capillary: 103 mg/dL — ABNORMAL HIGH (ref 70–99)
Glucose-Capillary: 83 mg/dL (ref 70–99)
Glucose-Capillary: 83 mg/dL (ref 70–99)
Glucose-Capillary: 86 mg/dL (ref 70–99)
Glucose-Capillary: 93 mg/dL (ref 70–99)

## 2019-08-05 LAB — MAGNESIUM: Magnesium: 1.6 mg/dL — ABNORMAL LOW (ref 1.7–2.4)

## 2019-08-05 MED ORDER — CHLORHEXIDINE GLUCONATE 0.12 % MT SOLN
OROMUCOSAL | Status: AC
  Filled 2019-08-05: qty 15

## 2019-08-05 MED ORDER — CHLORHEXIDINE GLUCONATE 0.12 % MT SOLN
OROMUCOSAL | Status: AC
  Administered 2019-08-05: 20:00:00 15 mL via OROMUCOSAL
  Filled 2019-08-05: qty 15

## 2019-08-05 MED ORDER — CHLORHEXIDINE GLUCONATE 0.12 % MT SOLN
OROMUCOSAL | Status: AC
  Administered 2019-08-05: 15 mL via OROMUCOSAL
  Filled 2019-08-05: qty 15

## 2019-08-05 MED ORDER — MAGNESIUM SULFATE 2 GM/50ML IV SOLN
2.0000 g | Freq: Once | INTRAVENOUS | Status: AC
  Administered 2019-08-05: 09:00:00 2 g via INTRAVENOUS
  Filled 2019-08-05: qty 50

## 2019-08-05 MED ORDER — POTASSIUM CHLORIDE CRYS ER 20 MEQ PO TBCR
40.0000 meq | EXTENDED_RELEASE_TABLET | Freq: Once | ORAL | Status: AC
  Administered 2019-08-05: 09:00:00 40 meq via ORAL
  Filled 2019-08-05: qty 2

## 2019-08-05 NOTE — Progress Notes (Addendum)
Patient ID: Tyler [name], male   DOB: 1971-04-26, 48 y.o.   MRN: [id number]  PROGRESS NOTE    Tyler [name]  QBV:[id number] DOB: Feb 19, 1971 DOA: 07/23/2019 PCP: Patient, No Pcp Per   Brief Narrative:  48 year old male with history of polysubstance abuse including heroin presented to [facility] ER after being found down with a syringe nearby.  He was given Narcan with minimal improvement.  Found in severe rhabdomyolysis with acute kidney injury and hyperkalemia.  He was started on IV fluids including bicarb drip and transferred to [facility] for further care.  Rhabdomyolysis improved but he had persistence of encephalopathy and was found to have acute bilateral cerebellar infarct.  Neurology was consulted.   Assessment & Plan:  Acute metabolic encephalopathy: Multifactorial -Most likely secondary to drug use, uremia, heavy alcohol use -Very slow to improve -CT of the head and neck was initially negative for any traumatic finding.  UDS was negative.  Tylenol and salicylate levels were negative. -TSH, folate, B12 levels -EEG: Generalized diffuse slowing but no seizure activity -Has required restraints intermittently.   -Continue to monitor mental status.  Fall precautions.  Acute bilateral CVA, nonhemorrhagic -MRI of the brain showed acute CVA.  No focal neurologic deficits -Neurology evaluated the patient and has signed off.  MRA of the head negative.  Ultrasound carotid showed bilateral 1 to 39% ICA stenosis.  Neurology recommends considering 30-day cardiac event monitoring as outpatient to rule out A. fib if patient neurologically improves. -Continue aspirin.  LDL 83; A1c 5.1.  Will start statin once LFTs normalized. -Echocardiogram: EF of 60 to 65% -Lower extremity Dopplers: Negative for DVT -Follow PT/OT/SLP recommendations.  PT recommends SNF.  Severe rhabdomyolysis Acute kidney injury -Significantly improved.  Renal ultrasound negative -Creatinine peaked to 5.79.  Creatinine  improving: Creatinine 1.45 today.  Continues to make urine.  Continue strict input and output.  Off IV fluids.  Hypomagnesemia-replace.  Hypokalemia-replace.  Repeat a.m. labs  Transaminitis with elevated bilirubin -Improving.  Trend LFTs intermittently.  Right upper quadrant ultrasound showed hepatic steatosis with early cirrhosis with some gallbladder thickening.  Alcohol use per history -Patient unable to tell me if he drinks alcohol daily or not.  Continue withdrawal protocol.  Continue thiamine, multivitamin and folic acid.  Leukocytosis -Resolved  Urinary tract infection: Present on admission -Resolved.  Treated with 5 days of Rocephin  Essential hypertension--monitor blood pressure.  Still on the higher side.  Continue amlodipine and metoprolol for now.  DVT prophylaxis: Heparin Code Status: Full Family Communication: Spoke to Melony/daughter on phone on 08/05/2019 Disposition Plan: SNF in 1 to 2 days once clinically stable  Consultants: Neurology  Procedures:  Echo IMPRESSIONS    1. The left ventricle has normal systolic function with an ejection fraction of 60-65%. The cavity size was normal. Left ventricular diastolic Doppler parameters are consistent with impaired relaxation. No evidence of left ventricular regional wall  motion abnormalities.  2. The right ventricle has normal systolic function. The cavity was normal. There is no increase in right ventricular wall thickness. Right ventricular systolic pressure is normal with an estimated pressure of 21.0 mmHg.  3. The aortic valve is tricuspid. Moderate sclerosis of the aortic valve.  4. The aorta is normal unless otherwise noted.  Antimicrobials:  Anti-infectives (From admission, onward)   Start     Dose/Rate Route Frequency Ordered Stop   07/25/19 0745  cefTRIAXone (ROCEPHIN) 1 g in sodium chloride 0.9 % 100 mL IVPB     1 g 200 mL/hr over  30 Minutes Intravenous Every 24 hours 07/25/19 0734 07/29/19 1518        Subjective: Patient seen and examined at bedside.  Very sleepy, wakes up only slightly, hardly answers any questions.  No overnight fever or vomiting reported.   Objective: Vitals:   08/04/19 1612 08/04/19 2015 08/05/19 0016 08/05/19 0440  BP: 130/83 133/74 (!) 137/105 (!) 143/97  Pulse: 77 77 72 67  Resp:  '14 16 16  ' Temp: 97.7 F (36.5 C) 98.1 F (36.7 C) 97.7 F (36.5 C) 98.2 F (36.8 C)  TempSrc: Axillary Oral Oral Oral  SpO2: 100% 100% 100% 100%  Weight:        Intake/Output Summary (Last 24 hours) at 08/05/2019 0737 Last data filed at 08/04/2019 1500 Gross per 24 hour  Intake 120 ml  Output -  Net 120 ml   Filed Weights   07/24/19 0624 08/02/19 0426 08/04/19 0325  Weight: 73 kg 67.8 kg 66.5 kg    Examination:  General exam: No distress.  Very sleepy, wakes up only slightly, hardly answers any questions.  Respiratory system: Bilateral decreased breath sounds at bases with some scattered crackles.  No wheezing  cardiovascular system: S1-S2 heard, rate controlled gastrointestinal system: Abdomen is nondistended, soft and nontender. Normal bowel sounds heard. Extremities: No cyanosis, edema    Data Reviewed: I have personally reviewed following labs and imaging studies  CBC: Recent Labs  Lab 08/02/19 0414 08/03/19 0422 08/04/19 0320  WBC 11.9* 10.4 8.8  NEUTROABS 7.6 6.7 5.5  HGB 10.1* 9.7* 9.8*  HCT 32.1* 31.2* 31.3*  MCV 93.6 96.3 94.0  PLT 214 212 193   Basic Metabolic Panel: Recent Labs  Lab 08/01/19 0356 08/02/19 0414 08/03/19 0422 08/04/19 0320 08/05/19 0413  NA 145 141 139 141 142  K 4.0 3.8 3.5 3.2* 3.3*  CL 113* 110 109 110 108  CO2 20* 20* 19* 23 24  GLUCOSE 87 81 85 85 88  BUN 44* 41* 36* 29* 24*  CREATININE 2.26* 2.02* 1.89* 1.55* 1.45*  CALCIUM 8.9 8.7* 8.6* 8.6* 8.9  MG 1.8 1.6* 1.5* 1.7 1.6*   GFR: CrCl cannot be calculated (Unknown ideal weight.). Liver Function Tests: Recent Labs  Lab 07/31/19 0314 08/01/19 0356  08/02/19 0414 08/03/19 0422 08/04/19 0320  AST 115* 92* 85* 85* 85*  ALT 140* 114* 93* 84* 79*  ALKPHOS 74 76 68 70 72  BILITOT 3.6* 3.4* 2.8* 3.1* 2.3*  PROT 6.1* 6.3* 6.1* 6.2* 6.5  ALBUMIN 2.6* 2.8* 2.6* 2.6* 2.6*   No results for input(s): LIPASE, AMYLASE in the last 168 hours. Recent Labs  Lab 07/30/19 0349  AMMONIA 34   Coagulation Profile: No results for input(s): INR, PROTIME in the last 168 hours. Cardiac Enzymes: Recent Labs  Lab 07/30/19 0349 07/31/19 0314 08/01/19 0356 08/02/19 0414  CKTOTAL 1,650* 543* 293 190   BNP (last 3 results) No results for input(s): PROBNP in the last 8760 hours. HbA1C: No results for input(s): HGBA1C in the last 72 hours. CBG: Recent Labs  Lab 08/04/19 1240 08/04/19 1734 08/04/19 2110 08/05/19 0020 08/05/19 0347  GLUCAP 97 81 82 100* 83   Lipid Profile: No results for input(s): CHOL, HDL, LDLCALC, TRIG, CHOLHDL, LDLDIRECT in the last 72 hours. Thyroid Function Tests: No results for input(s): TSH, T4TOTAL, FREET4, T3FREE, THYROIDAB in the last 72 hours. Anemia Panel: No results for input(s): VITAMINB12, FOLATE, FERRITIN, TIBC, IRON, RETICCTPCT in the last 72 hours. Sepsis Labs: No results for input(s): PROCALCITON, LATICACIDVEN in the  last 168 hours.  Recent Results (from the past 240 hour(s))  Culture, blood (Routine X 2) w Reflex to ID Panel     Status: None   Collection Time: 07/28/19 12:11 PM   Specimen: BLOOD LEFT ARM  Result Value Ref Range Status   Specimen Description BLOOD LEFT ARM  Final   Special Requests   Final    BOTTLES DRAWN AEROBIC AND ANAEROBIC Blood Culture adequate volume   Culture   Final    NO GROWTH 5 DAYS Performed at [facility], [address], [place], [place] [address]    Report Status 08/02/2019 FINAL  Final  Culture, blood (Routine X 2) w Reflex to ID Panel     Status: None   Collection Time: 07/28/19 12:12 PM   Specimen: BLOOD LEFT ARM  Result Value Ref Range Status    Specimen Description BLOOD LEFT ARM  Final   Special Requests   Final    BOTTLES DRAWN AEROBIC ONLY Blood Culture adequate volume   Culture   Final    NO GROWTH 5 DAYS Performed at [facility], [place] [address] [place], [place] [address]    Report Status 08/02/2019 FINAL  Final         Radiology Studies: No results found.      Scheduled Meds: .  stroke: mapping our early stages of recovery book   Does not apply Once  . amLODipine  10 mg Oral Daily  . aspirin  300 mg Rectal Daily   Or  . aspirin  325 mg Oral Daily  . chlorhexidine gluconate (MEDLINE KIT)  15 mL Mouth Rinse BID  . cyanocobalamin  1,000 mcg Intramuscular Daily  . folic acid  1 mg Oral Daily  . heparin  5,000 Units Subcutaneous Q8H  . insulin aspart  0-15 Units Subcutaneous TID WC  . insulin aspart  0-5 Units Subcutaneous QHS  . lactulose  20 g Oral BID   Or  . lactulose  300 mL Rectal BID  . mouth rinse  15 mL Mouth Rinse 10 times per day  . metoprolol tartrate  25 mg Oral BID  . multivitamin  15 mL Oral Daily  . thiamine  100 mg Oral Daily   Or  . thiamine  100 mg Intravenous Daily   Continuous Infusions:    LOS: 13 days        Aline August, MD [facility] 08/05/2019, 7:37 AM

## 2019-08-05 NOTE — Progress Notes (Signed)
Patient refused to cooperate with staff; difficult to do neuro checks; patient able to move all extremities; no acute distress noted on assessments, no complaints. MD aware. Will continue to monitor. 

## 2019-08-05 NOTE — Progress Notes (Signed)
Patient's daughter Mellissa Kohut called staff for updates on her father. She asked to speak with patient's doctor. MD made aware. Will continue to monitor.

## 2019-08-05 NOTE — Progress Notes (Signed)
Patient refused to cooperate with staff; difficult to do neuro checks; patient able to move all extremities; no acute distress noted on assessments, no complaints. MD aware. Will continue to monitor.

## 2019-08-06 LAB — CBC WITH DIFFERENTIAL/PLATELET
Abs Immature Granulocytes: 0.12 10*3/uL — ABNORMAL HIGH (ref 0.00–0.07)
Basophils Absolute: 0.1 10*3/uL (ref 0.0–0.1)
Basophils Relative: 1 %
Eosinophils Absolute: 0.2 10*3/uL (ref 0.0–0.5)
Eosinophils Relative: 3 %
HCT: 32.6 % — ABNORMAL LOW (ref 39.0–52.0)
Hemoglobin: 10.3 g/dL — ABNORMAL LOW (ref 13.0–17.0)
Immature Granulocytes: 1 %
Lymphocytes Relative: 23 %
Lymphs Abs: 2 10*3/uL (ref 0.7–4.0)
MCH: 30 pg (ref 26.0–34.0)
MCHC: 31.6 g/dL (ref 30.0–36.0)
MCV: 95 fL (ref 80.0–100.0)
Monocytes Absolute: 0.8 10*3/uL (ref 0.1–1.0)
Monocytes Relative: 9 %
Neutro Abs: 5.5 10*3/uL (ref 1.7–7.7)
Neutrophils Relative %: 63 %
Platelets: 299 10*3/uL (ref 150–400)
RBC: 3.43 MIL/uL — ABNORMAL LOW (ref 4.22–5.81)
RDW: 16.6 % — ABNORMAL HIGH (ref 11.5–15.5)
WBC: 8.6 10*3/uL (ref 4.0–10.5)
nRBC: 0 % (ref 0.0–0.2)

## 2019-08-06 LAB — GLUCOSE, CAPILLARY
Glucose-Capillary: 73 mg/dL (ref 70–99)
Glucose-Capillary: 94 mg/dL (ref 70–99)
Glucose-Capillary: 75 mg/dL (ref 70–99)
Glucose-Capillary: 75 mg/dL (ref 70–99)
Glucose-Capillary: 84 mg/dL (ref 70–99)

## 2019-08-06 LAB — BASIC METABOLIC PANEL
Anion gap: 8 (ref 5–15)
BUN: 23 mg/dL — ABNORMAL HIGH (ref 6–20)
CO2: 24 mmol/L (ref 22–32)
Calcium: 8.8 mg/dL — ABNORMAL LOW (ref 8.9–10.3)
Chloride: 109 mmol/L (ref 98–111)
Creatinine, Ser: 1.48 mg/dL — ABNORMAL HIGH (ref 0.61–1.24)
GFR calc Af Amer: >60 mL/min (ref >60)
GFR calc non Af Amer: 54 mL/min — ABNORMAL LOW (ref >60)
Glucose, Bld: 89 mg/dL (ref 70–99)
Potassium: 3.5 mmol/L (ref 3.5–5.1)
Sodium: 141 mmol/L (ref 135–145)

## 2019-08-06 LAB — MAGNESIUM: Magnesium: 1.8 mg/dL (ref 1.7–2.4)

## 2019-08-06 NOTE — Progress Notes (Signed)
Pt's daughter Mellissa Kohut call for update. Pt ate his dinner with assistance, fallow commands, still difficult to complete neuro assessment due to pt mental status. No acute distress noted. Will continue to monitor pt.

## 2019-08-06 NOTE — Progress Notes (Signed)
Patient ID: Tyler [name], male   DOB: 12-Sep-1971, 48 y.o.   MRN: [id number]  PROGRESS NOTE    Tyler [name]  XAJ:[id number] DOB: [birth year] DOA: 07/23/2019 PCP: Patient, No Pcp Per   Brief Narrative:  48 year old male with history of polysubstance abuse including heroin presented to [facility] ER after being found down with a syringe nearby.  He was given Narcan with minimal improvement.  Found in severe rhabdomyolysis with acute kidney injury and hyperkalemia.  He was started on IV fluids including bicarb drip and transferred to [facility] for further care.  Rhabdomyolysis improved but he had persistence of encephalopathy and was found to have acute bilateral cerebellar infarct.  Neurology was consulted.   Assessment & Plan:  Acute metabolic encephalopathy: Multifactorial -Most likely secondary to drug use, uremia, heavy alcohol use -CT of the head and neck was initially negative for any traumatic finding.  UDS was negative.  Tylenol and salicylate levels were negative. -TSH, folate, B12 levels -EEG: Generalized diffuse slowing but no seizure activity -Has required restraints intermittently.   -Mental status has much improved months.  Mostly awake but hardly answers questions. continue to monitor mental status.  Fall precautions.  Acute bilateral CVA, nonhemorrhagic -MRI of the brain showed acute CVA.  No focal neurologic deficits -Neurology evaluated the patient and has signed off.  MRA of the head negative.  Ultrasound carotid showed bilateral 1 to 39% ICA stenosis.  Neurology recommends considering 30-day cardiac event monitoring as outpatient to rule out A. fib if patient neurologically improves. -Continue aspirin.  LDL 83; A1c 5.1.  Will start statin once LFTs normalized. -Echocardiogram: EF of 60 to 65% -Lower extremity Dopplers: Negative for DVT -Follow PT/OT/SLP recommendations.  PT recommends SNF.  Severe rhabdomyolysis Acute kidney injury -Significantly improved.  Renal  ultrasound negative -Creatinine peaked to 5.79.  Creatinine improving: Creatinine 1.48 today.  Continues to make urine.  Continue strict input and output.  Off IV fluids.  Hypomagnesemia-improved  Hypokalemia-improved  Transaminitis with elevated bilirubin -Improved.  Trend LFTs intermittently.  Right upper quadrant ultrasound showed hepatic steatosis with early cirrhosis with some gallbladder thickening.  Alcohol use per history -Patient unable to tell me if he drinks alcohol daily or not.  Continue withdrawal protocol.  Continue thiamine, multivitamin and folic acid.  Leukocytosis -Resolved  Urinary tract infection: Present on admission -Resolved.  Treated with 5 days of Rocephin  Essential hypertension--monitor blood pressure.  Still on the higher side intermittently.  Continue amlodipine and metoprolol for now.  DVT prophylaxis: Heparin Code Status: Full Family Communication: Spoke to Melony/daughter on phone on 08/05/2019 Disposition Plan: SNF in 1 to 2 days once clinically stable  Consultants: Neurology  Procedures:  Echo IMPRESSIONS    1. The left ventricle has normal systolic function with an ejection fraction of 60-65%. The cavity size was normal. Left ventricular diastolic Doppler parameters are consistent with impaired relaxation. No evidence of left ventricular regional wall  motion abnormalities.  2. The right ventricle has normal systolic function. The cavity was normal. There is no increase in right ventricular wall thickness. Right ventricular systolic pressure is normal with an estimated pressure of 21.0 mmHg.  3. The aortic valve is tricuspid. Moderate sclerosis of the aortic valve.  4. The aorta is normal unless otherwise noted.  Antimicrobials:  Anti-infectives (From admission, onward)   Start     Dose/Rate Route Frequency Ordered Stop   07/25/19 0745  cefTRIAXone (ROCEPHIN) 1 g in sodium chloride 0.9 % 100 mL IVPB  1 g 200 mL/hr over 30 Minutes  Intravenous Every 24 hours 07/25/19 0734 07/29/19 1518       Subjective: Patient seen and examined at bedside.  Very poor historian.  Awake, hardly answers any questions.  No overnight vomiting or fever reported per nursing staff.  Objective: Vitals:   08/05/19 2117 08/06/19 0039 08/06/19 0444 08/06/19 0555  BP: (!) 161/97 133/90 (!) 134/91   Pulse: 68 61 73   Resp: '18 18 18   ' Temp: 97.8 F (36.6 C) 98.2 F (36.8 C) 98 F (36.7 C)   TempSrc: Oral Oral    SpO2: 100% 100% 100%   Weight:    63.4 kg    Intake/Output Summary (Last 24 hours) at 08/06/2019 0741 Last data filed at 08/05/2019 0918 Gross per 24 hour  Intake 90 ml  Output -  Net 90 ml   Filed Weights   08/02/19 0426 08/04/19 0325 08/06/19 0555  Weight: 67.8 kg 66.5 kg 63.4 kg    Examination:  General exam: No acute distress.  Awake, hardly answers any questions.  Very withdrawn.   Respiratory system: Bilateral decreased breath sounds at bases with scattered crackles.   Cardiovascular system: Rate controlled, S1-S2 heard gastrointestinal system: Abdomen is nondistended, soft and nontender. Normal bowel sounds heard. Extremities: No cyanosis, edema    Data Reviewed: I have personally reviewed following labs and imaging studies  CBC: Recent Labs  Lab 08/02/19 0414 08/03/19 0422 08/04/19 0320 08/06/19 0401  WBC 11.9* 10.4 8.8 8.6  NEUTROABS 7.6 6.7 5.5 5.5  HGB 10.1* 9.7* 9.8* 10.3*  HCT 32.1* 31.2* 31.3* 32.6*  MCV 93.6 96.3 94.0 95.0  PLT 214 212 231 855   Basic Metabolic Panel: Recent Labs  Lab 08/02/19 0414 08/03/19 0422 08/04/19 0320 08/05/19 0413 08/06/19 0401  NA 141 139 141 142 141  K 3.8 3.5 3.2* 3.3* 3.5  CL 110 109 110 108 109  CO2 20* 19* '23 24 24  ' GLUCOSE 81 85 85 88 89  BUN 41* 36* 29* 24* 23*  CREATININE 2.02* 1.89* 1.55* 1.45* 1.48*  CALCIUM 8.7* 8.6* 8.6* 8.9 8.8*  MG 1.6* 1.5* 1.7 1.6* 1.8   GFR: CrCl cannot be calculated (Unknown ideal weight.). Liver Function Tests:  Recent Labs  Lab 07/31/19 0314 08/01/19 0356 08/02/19 0414 08/03/19 0422 08/04/19 0320  AST 115* 92* 85* 85* 85*  ALT 140* 114* 93* 84* 79*  ALKPHOS 74 76 68 70 72  BILITOT 3.6* 3.4* 2.8* 3.1* 2.3*  PROT 6.1* 6.3* 6.1* 6.2* 6.5  ALBUMIN 2.6* 2.8* 2.6* 2.6* 2.6*   No results for input(s): LIPASE, AMYLASE in the last 168 hours. No results for input(s): AMMONIA in the last 168 hours. Coagulation Profile: No results for input(s): INR, PROTIME in the last 168 hours. Cardiac Enzymes: Recent Labs  Lab 07/31/19 0314 08/01/19 0356 08/02/19 0414  CKTOTAL 543* 293 190   BNP (last 3 results) No results for input(s): PROBNP in the last 8760 hours. HbA1C: No results for input(s): HGBA1C in the last 72 hours. CBG: Recent Labs  Lab 08/05/19 0347 08/05/19 0816 08/05/19 1222 08/05/19 1636 08/05/19 2114  GLUCAP 83 86 103* 83 93   Lipid Profile: No results for input(s): CHOL, HDL, LDLCALC, TRIG, CHOLHDL, LDLDIRECT in the last 72 hours. Thyroid Function Tests: No results for input(s): TSH, T4TOTAL, FREET4, T3FREE, THYROIDAB in the last 72 hours. Anemia Panel: No results for input(s): VITAMINB12, FOLATE, FERRITIN, TIBC, IRON, RETICCTPCT in the last 72 hours. Sepsis Labs: No results for  input(s): PROCALCITON, LATICACIDVEN in the last 168 hours.  Recent Results (from the past 240 hour(s))  Culture, blood (Routine X 2) w Reflex to ID Panel     Status: None   Collection Time: 07/28/19 12:11 PM   Specimen: BLOOD LEFT ARM  Result Value Ref Range Status   Specimen Description BLOOD LEFT ARM  Final   Special Requests   Final    BOTTLES DRAWN AEROBIC AND ANAEROBIC Blood Culture adequate volume   Culture   Final    NO GROWTH 5 DAYS Performed at [facility], [address] [place], [place] [address]    Report Status 08/02/2019 FINAL  Final  Culture, blood (Routine X 2) w Reflex to ID Panel     Status: None   Collection Time: 07/28/19 12:12 PM   Specimen: BLOOD LEFT ARM   Result Value Ref Range Status   Specimen Description BLOOD LEFT ARM  Final   Special Requests   Final    BOTTLES DRAWN AEROBIC ONLY Blood Culture adequate volume   Culture   Final    NO GROWTH 5 DAYS Performed at [facility], [place] [address], [place], [place] [address]    Report Status 08/02/2019 FINAL  Final         Radiology Studies: No results found.      Scheduled Meds: .  stroke: mapping our early stages of recovery book   Does not apply Once  . amLODipine  10 mg Oral Daily  . aspirin  300 mg Rectal Daily   Or  . aspirin  325 mg Oral Daily  . chlorhexidine gluconate (MEDLINE KIT)  15 mL Mouth Rinse BID  . cyanocobalamin  1,000 mcg Intramuscular Daily  . folic acid  1 mg Oral Daily  . heparin  5,000 Units Subcutaneous Q8H  . insulin aspart  0-15 Units Subcutaneous TID WC  . insulin aspart  0-5 Units Subcutaneous QHS  . lactulose  20 g Oral BID   Or  . lactulose  300 mL Rectal BID  . mouth rinse  15 mL Mouth Rinse 10 times per day  . metoprolol tartrate  25 mg Oral BID  . multivitamin  15 mL Oral Daily  . thiamine  100 mg Oral Daily   Or  . thiamine  100 mg Intravenous Daily   Continuous Infusions:    LOS: 14 days        Aline August, MD [facility] 08/06/2019, 7:41 AM

## 2019-08-06 NOTE — Progress Notes (Signed)
Pt refusing to cooperate with staff and answer questions, neuro checks/ NIH difficult to complete, pt alert and moving all 4 extremities, vital signs satble Dr Starla Link aware.

## 2019-08-06 NOTE — Progress Notes (Signed)
Physical Therapy Treatment Patient Details Name: Tyler AppleSteven Mccullers MRN: [id number] DOB: 06/23/1971 Today's Date: 08/06/2019    History of Present Illness Pt is a 48 y/o male transferred from [place] [facility] for unresponsiveness, likely secondary to substance abuse. Pt with rhabdomyolysis and AKI. EEG revealed moderate diffuse encephalopathy. PMH includes polysubstance abuse and HTN.     PT Comments    Pt was seen for further work on mobility and note his reluctance to remain sitting despite agreeing to try to sit up.  Pt is having a struggle to sit due to lethargy and difficulty focusing on the task.  He is able to control sit balance but resists the effort after a short time to request a return to bed.  Follow up with pt in a focused way to increase his transfers and bed mob to restore as much independence as possible.  Pt is reluctant but is capable of doing more.     Follow Up Recommendations  SNF;Supervision for mobility/OOB     Equipment Recommendations  None recommended by PT    Recommendations for Other Services       Precautions / Restrictions Precautions Precautions: Fall Restrictions Weight Bearing Restrictions: No    Mobility  Bed Mobility Overal bed mobility: Needs Assistance Bed Mobility: Rolling;Supine to Sit;Sit to Supine Rolling: Mod assist   Supine to sit: Max assist Sit to supine: Max assist   General bed mobility comments: pt does not assist with LE's to support sidelying to sitting effort  Transfers                 General transfer comment: resisted attempts  Ambulation/Gait             General Gait Details: unable to attempt   Stairs             Wheelchair Mobility    Modified Rankin (Stroke Patients Only)       Balance Overall balance assessment: Needs assistance Sitting-balance support: Bilateral upper extremity supported;Feet supported Sitting balance-Leahy Scale: Poor Sitting balance - Comments: not using  protective ext but also resists to L side to return to bed Postural control: Left lateral lean(resisting)                                  Cognition Arousal/Alertness: Lethargic Behavior During Therapy: Flat affect Overall Cognitive Status: No family/caregiver present to determine baseline cognitive functioning Area of Impairment: Orientation;Attention;Memory;Following commands;Safety/judgement;Awareness;Problem solving                 Orientation Level: Situation;Time Current Attention Level: Selective Memory: Decreased recall of precautions;Decreased short-term memory Following Commands: Follows one step commands inconsistently Safety/Judgement: Decreased awareness of safety;Decreased awareness of deficits Awareness: Intellectual Problem Solving: Slow processing;Decreased initiation;Difficulty sequencing;Requires verbal cues;Requires tactile cues General Comments: slow to move, resisting PT trying to support sitting balance      Exercises      General Comments General comments (skin integrity, edema, etc.): Pt is able to assist sitting but after a short time began to resist PT helping him to control sit balance      Pertinent Vitals/Pain Pain Assessment: Faces Faces Pain Scale: No hurt Pain Intervention(s): Monitored during session    Home Living                      Prior Function            PT Goals (current  goals can now be found in the care plan section) Progress towards PT goals: Progressing toward goals    Frequency    Min 2X/week      PT Plan Current plan remains appropriate    Co-evaluation              AM-PAC PT "6 Clicks" Mobility   Outcome Measure  Help needed turning from your back to your side while in a flat bed without using bedrails?: A Lot Help needed moving from lying on your back to sitting on the side of a flat bed without using bedrails?: A Lot Help needed moving to and from a bed to a chair  (including a wheelchair)?: A Lot Help needed standing up from a chair using your arms (e.g., wheelchair or bedside chair)?: Total Help needed to walk in hospital room?: Total Help needed climbing 3-5 steps with a railing? : Total 6 Click Score: 9    End of Session   Activity Tolerance: Patient tolerated treatment well;Patient limited by lethargy Patient left: in bed;with call bell/phone within reach;with bed alarm set Nurse Communication: Mobility status PT Visit Diagnosis: Other abnormalities of gait and mobility (R26.89);Difficulty in walking, not elsewhere classified (R26.2)     Time: 2992-4268 PT Time Calculation (min) (ACUTE ONLY): 24 min  Charges:  $Therapeutic Activity: 23-37 mins                  Ramond Dial 08/06/2019, 5:13 PM   Mee Hives, PT MS Acute Rehab Dept. Number: [place] and [place]

## 2019-08-06 NOTE — NC FL2 (Signed)
[place] LEVEL OF CARE SCREENING TOOL     IDENTIFICATION  Patient Name: Tyler [name] Birthdate: May 03, 1971 Sex: male Admission Date (Current Location): 07/23/2019  [employer] [place] and [place] Number:  [employer] and Address:  The [place], [place] [address] [place], [place] [address]      Provider Number: [id number]  Attending Physician Name and Address:  Aline August, MD  Relative Name and Phone Number:  Mellissa Kohut, daughter, [phone]    Current Level of Care: Hospital Recommended Level of Care: [place] Prior Approval Number:    Date Approved/Denied:   PASRR Number: [id number]  Discharge Plan: SNF    Current Diagnoses: Patient Active Problem List   Diagnosis Date Noted  . Cerebral embolism with cerebral infarction 07/28/2019  . Rhabdomyolysis 07/23/2019  . Acute kidney failure ([place]) 07/23/2019  . Acute metabolic encephalopathy 09/32/3557  . Polysubstance abuse ([place]) 07/23/2019  . Hyperkalemia 07/23/2019  . Transaminitis 07/23/2019  . HTN (hypertension) 07/23/2019  . Fall 04/21/2012  . Traumatic subdural hematoma ([place]) 04/21/2012  . [place] 04/21/2012  . Left orbit fracture 04/21/2012  . Left maxillary fracture ([place]) 04/21/2012  . Alcohol use 04/21/2012  . Cocaine use 04/21/2012    Orientation RESPIRATION BLADDER Height & Weight     Self  Normal Incontinent, External catheter Weight: 139 lb 12.4 oz (63.4 kg) Height:     BEHAVIORAL SYMPTOMS/MOOD NEUROLOGICAL BOWEL NUTRITION STATUS      Incontinent Diet(See DC Summary)  AMBULATORY STATUS COMMUNICATION OF NEEDS Skin   Extensive Assist Verbally Normal                       Personal Care Assistance Level of Assistance  Bathing, Feeding, Dressing Bathing Assistance: Maximum assistance Feeding assistance: Maximum assistance Dressing Assistance: Maximum assistance     Functional Limitations Info  Sight, Hearing, Speech Sight Info:  Adequate Hearing Info: Adequate Speech Info: Adequate    SPECIAL CARE FACTORS FREQUENCY  PT (By licensed PT), OT (By licensed OT)     PT Frequency: 3x OT Frequency: 2x            Contractures Contractures Info: Not present    Additional Factors Info  Code Status, Allergies, Insulin Sliding Scale Code Status Info: Full Allergies Info: NKA   Insulin Sliding Scale Info: See dc summary for dose       Current Medications (08/06/2019):  This is the current hospital active medication list Current Facility-Administered Medications  Medication Dose Route Frequency Provider Last Rate Last Dose  .  stroke: mapping our early stages of recovery book   Does not apply Once Amin, Jeanella Flattery, MD      . acetaminophen (TYLENOL) tablet 650 mg  650 mg Oral Q6H PRN Etta Quill, DO   650 mg at 08/05/19 3220   Or  . acetaminophen (TYLENOL) suppository 650 mg  650 mg Rectal Q6H PRN Etta Quill, DO      . alum & mag hydroxide-simeth (MAALOX/MYLANTA) 200-200-20 MG/5ML suspension 30 mL  30 mL Oral Q4H PRN Amin, Ankit Chirag, MD      . amLODipine (NORVASC) tablet 10 mg  10 mg Oral Daily Aline August, MD   10 mg at 08/06/19 0923  . aspirin suppository 300 mg  300 mg Rectal Daily Amin, Ankit Chirag, MD   300 mg at 07/28/19 2542   Or  . aspirin tablet 325 mg  325 mg Oral Daily Amin, Jeanella Flattery, MD  325 mg at 08/06/19 0923  . chlorhexidine gluconate (MEDLINE KIT) (PERIDEX) 0.12 % solution 15 mL  15 mL Mouth Rinse BID Jennette Kettle M, DO   15 mL at 08/06/19 3545  . folic acid (FOLVITE) tablet 1 mg  1 mg Oral Daily Aline August, MD   1 mg at 08/06/19 0923  . guaiFENesin-dextromethorphan (ROBITUSSIN DM) 100-10 MG/5ML syrup 5 mL  5 mL Oral Q4H PRN Amin, Ankit Chirag, MD      . haloperidol lactate (HALDOL) injection 2-5 mg  2-5 mg Intravenous Q6H PRN Alekh, Kshitiz, MD       Or  . haloperidol lactate (HALDOL) injection 2-5 mg  2-5 mg Intramuscular Q6H PRN Alekh, Kshitiz, MD      . heparin  injection 5,000 Units  5,000 Units Subcutaneous Q8H Jennette Kettle M, DO   5,000 Units at 08/06/19 1425  . hydrALAZINE (APRESOLINE) injection 10 mg  10 mg Intravenous Q6H PRN Bodenheimer, Charles A, NP   10 mg at 07/31/19 0414  . hydrocortisone (ANUSOL-HC) 2.5 % rectal cream 1 application  1 application Topical QID PRN Amin, Ankit Chirag, MD      . hydrocortisone cream 1 % 1 application  1 application Topical TID PRN Amin, Ankit Chirag, MD      . insulin aspart (novoLOG) injection 0-15 Units  0-15 Units Subcutaneous TID WC Amin, Ankit Chirag, MD      . insulin aspart (novoLOG) injection 0-5 Units  0-5 Units Subcutaneous QHS Amin, Ankit Chirag, MD      . ipratropium-albuterol (DUONEB) 0.5-2.5 (3) MG/3ML nebulizer solution 3 mL  3 mL Nebulization Q4H PRN Amin, Ankit Chirag, MD      . lactulose (CHRONULAC) 10 GM/15ML solution 20 g  20 g Oral BID Amin, Ankit Chirag, MD   20 g at 08/06/19 0923   Or  . lactulose (CHRONULAC) enema 200 gm  300 mL Rectal BID Damita Lack, MD   300 mL at 07/31/19 2209  . MEDLINE mouth rinse  15 mL Mouth Rinse 10 times per day Etta Quill, DO   15 mL at 08/06/19 6256  . metoprolol tartrate (LOPRESSOR) tablet 25 mg  25 mg Oral BID Aline August, MD   25 mg at 08/06/19 0923  . multivitamin liquid 15 mL  15 mL Oral Daily Amin, Ankit Chirag, MD   15 mL at 08/06/19 0922  . Muscle Rub CREA 1 application  1 application Topical PRN Amin, Ankit Chirag, MD      . ondansetron (ZOFRAN) tablet 4 mg  4 mg Oral Q6H PRN Etta Quill, DO       Or  . ondansetron (ZOFRAN) injection 4 mg  4 mg Intravenous Q6H PRN Etta Quill, DO      . phenol (CHLORASEPTIC) mouth spray 1 spray  1 spray Mouth/Throat PRN Amin, Ankit Chirag, MD      . polyethylene glycol (MIRALAX / GLYCOLAX) packet 17 g  17 g Oral Daily PRN Amin, Ankit Chirag, MD      . polyvinyl alcohol (LIQUIFILM TEARS) 1.4 % ophthalmic solution 1 drop  1 drop Both Eyes PRN Amin, Ankit Chirag, MD      . senna-docusate  (Senokot-S) tablet 2 tablet  2 tablet Oral QHS PRN Amin, Ankit Chirag, MD      . sodium chloride (OCEAN) 0.65 % nasal spray 1 spray  1 spray Each Nare PRN Amin, Ankit Chirag, MD      . sodium chloride flush (NS) 0.9 % injection  10-40 mL  10-40 mL Intracatheter PRN Amin, Ankit Chirag, MD      . thiamine (VITAMIN B-1) tablet 100 mg  100 mg Oral Daily Jennette Kettle M, DO   100 mg at 08/06/19 5956   Or  . thiamine (B-1) injection 100 mg  100 mg Intravenous Daily Jennette Kettle M, DO   100 mg at 08/02/19 1054     Discharge Medications: Please see discharge summary for a list of discharge medications.  Relevant Imaging Results:  Relevant Lab Results:   Additional Information SSN: [place] [facility], [place]

## 2019-08-06 NOTE — Progress Notes (Signed)
Spoke with pts daughter Mellissa Kohut and updated her on pts status and plan of care.

## 2019-08-07 LAB — GLUCOSE, CAPILLARY
Glucose-Capillary: 84 mg/dL (ref 70–99)
Glucose-Capillary: 82 mg/dL (ref 70–99)
Glucose-Capillary: 83 mg/dL (ref 70–99)
Glucose-Capillary: 80 mg/dL (ref 70–99)

## 2019-08-07 MED ORDER — ORAL CARE MOUTH RINSE
15.0000 mL | Freq: Two times a day (BID) | OROMUCOSAL | Status: DC
  Administered 2019-08-07 – 2019-09-03 (×45): 15 mL via OROMUCOSAL

## 2019-08-07 NOTE — Evaluation (Signed)
Speech Language Pathology Evaluation Patient Details Name: Tyler [name] MRN: [id number] DOB: 06-Jun-1971 Today's Date: 08/07/2019 Time: 6789-3810 SLP Time Calculation (min) (ACUTE ONLY): 18 min  Problem List:  Patient Active Problem List   Diagnosis Date Noted  . Cerebral embolism with cerebral infarction 07/28/2019  . Rhabdomyolysis 07/23/2019  . Acute kidney failure ([place]) 07/23/2019  . Acute metabolic encephalopathy 17/51/0258  . Polysubstance abuse ([place]) 07/23/2019  . Hyperkalemia 07/23/2019  . Transaminitis 07/23/2019  . HTN (hypertension) 07/23/2019  . Fall 04/21/2012  . Traumatic subdural hematoma ([place]) 04/21/2012  . [place] 04/21/2012  . Left orbit fracture 04/21/2012  . Left maxillary fracture ([place]) 04/21/2012  . Alcohol use 04/21/2012  . Cocaine use 04/21/2012   Past Medical History:  Past Medical History:  Diagnosis Date  . Alcohol abuse   . Back pain   . Hypertension   . Kidney disease    Past Surgical History:  Past Surgical History:  Procedure Laterality Date  . MANDIBLE SURGERY    . NERVE, TENDON AND ARTERY REPAIR Left 10/07/2014   Procedure: ORIF PROXIMAL THUMB REPAIR OF EPL AND FLP TENDON MICROSCOPIC NERVE, TENDON AND RADIAL ARTERY REPAIR WITH WOUND EXPLORATION;  Surgeon: Charlotte Crumb, MD;  Location: [place];  Service: Orthopedics;  Laterality: Left;  OEC, MICROSCOPE   HPI:  Pt is a 48 y.o. male with medical history significant of polysubstance abuse including heroin who was brought in to [facility] ED on 07/23/19 after being found down by friend with nearby syringe. He was transferred to [facility] for further care. MRI of the brain of 07/25/19 showed patchy acute bilateral cerebral and cerebellar infarcts. Right temporal lobe encephalomalacia and chronic cerebral white matter disease.    Assessment / Plan / Recommendation Clinical Impression  Pt's daughter was present for the evaluation and she assisted in provision of history. Per the daughter, the  pt has a ninth-grade education, was independent prior to admission, and worked various "odd jobs". She denied the pt having any baseline deficits in speech, language or cognition and stated that she believed he may have now "mentally given up". Assessment of the pt was difficulty due to his reduced cooperation during the evaluation and the limited nature of his verbal output. Pt frequently responded, "I don't know" to questions and with encouragement demonstrated agitation and stated, "I don't want none of this". Additional processing time was required throughout the evaluation and the pt exhibited difficulty attending to tasks. However, despite repetition and provision of additional processing time, pt still stated, "I don't know". Pt currently appears to demonstrate a cognitive-linguistic impairment with deficits at least in attention, awareness, orientation, reasoning, and memory. Skilled SLP services are clinically at this time for cognitive-linguistic treatment and to further evaluate the nature and severity of the pt's deficits. Pt's daughter and nursing were educated regarding results and recommendations. Both parties verbalized understanding as well as agreement with plan of care.     SLP Assessment  SLP Recommendation/Assessment: Patient needs continued Speech Lanaguage Pathology Services SLP Visit Diagnosis: Cognitive communication deficit (R41.841)    Follow Up Recommendations  Skilled Nursing facility;24 hour supervision/assistance    Frequency and Duration min 2x/week  2 weeks      SLP Evaluation Cognition  Overall Cognitive Status: Impaired/Different from baseline Arousal/Alertness: Lethargic Orientation Level: Oriented to person;Disoriented to place;Disoriented to time;Disoriented to situation Attention: Focused;Sustained Focused Attention: Impaired Focused Attention Impairment: Verbal basic Sustained Attention: Impaired Sustained Attention Impairment: Verbal basic Awareness:  Impaired Awareness Impairment: Intellectual impairment  Comprehension  Auditory Comprehension Overall Auditory Comprehension: Impaired Yes/No Questions: Impaired Basic Biographical Questions: (1/4) Commands: Impaired One Step Basic Commands: (1/5) Conversation: Simple Reading Comprehension Reading Status: Not tested    Expression Expression Primary Mode of Expression: Verbal Verbal Expression Overall Verbal Expression: Impaired Initiation: Impaired Automatic Speech: Counting(2/10) Level of Generative/Spontaneous Verbalization: Phrase Repetition: Impaired Level of Impairment: Word level;Phrase level Naming: Impairment Responsive: Not tested Confrontation: (0/4) Convergent: Not tested Divergent: Not tested   Oral / Motor  Oral Motor/Sensory Function Overall Oral Motor/Sensory Function: Within functional limits Motor Speech Overall Motor Speech: Appears within functional limits for tasks assessed Respiration: Within functional limits Phonation: Normal Resonance: Within functional limits Articulation: Within functional limitis Intelligibility: Intelligible Motor Planning: Witnin functional limits Motor Speech Errors: Not applicable   Auguste Tebbetts I. Vear ClockPhillips, MS, CCC-SLP Acute Rehabilitation Services Office number [phone] Pager [phone]                    Scheryl MartenShanika I Tucker Steedley 08/07/2019, 1:37 PM

## 2019-08-07 NOTE — Progress Notes (Signed)
Patient ID: Tyler [name], male   DOB: [birth year], 48 y.o.   MRN: [id number]  PROGRESS NOTE    Tyrrell [name]  SHF:[id number] DOB: 04-28-1971 DOA: 07/23/2019 PCP: Patient, No Pcp Per   Brief Narrative:  48 year old male with history of polysubstance abuse including heroin presented to [facility] ER after being found down with a syringe nearby.  He was given Narcan with minimal improvement.  Found in severe rhabdomyolysis with acute kidney injury and hyperkalemia.  He was started on IV fluids including bicarb drip and transferred to [facility] for further care.  Rhabdomyolysis improved but he had persistence of encephalopathy and was found to have acute bilateral cerebellar infarct.  Neurology was consulted.  Neurology has signed off.  PT recommends SNF placement.  Assessment & Plan:  Acute metabolic encephalopathy: Multifactorial -Most likely secondary to drug use, uremia, heavy alcohol use -CT of the head and neck was initially negative for any traumatic finding.  UDS was negative.  Tylenol and salicylate levels were negative. -TSH, folate, B12 levels -EEG: Generalized diffuse slowing but no seizure activity -Has required restraints intermittently.   -Mental status has not improved much.  Mostly awake but hardly answers questions. continue to monitor mental status.  Fall precautions.  Acute bilateral CVA, nonhemorrhagic -MRI of the brain showed acute CVA.  No focal neurologic deficits -Neurology evaluated the patient and has signed off.  MRA of the head negative.  Ultrasound carotid showed bilateral 1 to 39% ICA stenosis.  Neurology recommends considering 30-day cardiac event monitoring as outpatient to rule out A. fib if patient neurologically improves. -Continue aspirin.  LDL 83; A1c 5.1.  Will start statin once LFTs normalized. -Echocardiogram: EF of 60 to 65% -Lower extremity Dopplers: Negative for DVT -Follow PT/OT/SLP recommendations.  PT recommends SNF.  Severe rhabdomyolysis Acute  kidney injury -Significantly improved.  Renal ultrasound negative -Creatinine peaked to 5.79.  Creatinine improving, was 1.48 yesterday.  Monitor creatinine intermittently.  Continues to make urine.  Continue strict input and output.  Off IV fluids.  Hypomagnesemia-improved  Hypokalemia-improved  Transaminitis with elevated bilirubin -Improved.  Trend LFTs intermittently.  Right upper quadrant ultrasound showed hepatic steatosis with early cirrhosis with some gallbladder thickening.  Alcohol use per history -Patient unable to tell me if he drinks alcohol daily or not.  Continue withdrawal protocol.  Continue thiamine, multivitamin and folic acid.  Leukocytosis -Resolved  Urinary tract infection: Present on admission -Resolved.  Treated with 5 days of Rocephin  Essential hypertension--monitor blood pressure.  Still on the higher side intermittently.  Continue amlodipine and metoprolol for now.  DVT prophylaxis: Heparin Code Status: Full Family Communication: Spoke to Melony/daughter on phone on 08/05/2019 Disposition Plan: SNF in 1 to 2 days once clinically stable  Consultants: Neurology  Procedures:  Echo IMPRESSIONS    1. The left ventricle has normal systolic function with an ejection fraction of 60-65%. The cavity size was normal. Left ventricular diastolic Doppler parameters are consistent with impaired relaxation. No evidence of left ventricular regional wall  motion abnormalities.  2. The right ventricle has normal systolic function. The cavity was normal. There is no increase in right ventricular wall thickness. Right ventricular systolic pressure is normal with an estimated pressure of 21.0 mmHg.  3. The aortic valve is tricuspid. Moderate sclerosis of the aortic valve.  4. The aorta is normal unless otherwise noted.  Antimicrobials:  Anti-infectives (From admission, onward)   Start     Dose/Rate Route Frequency Ordered Stop   07/25/19 0745  cefTRIAXone (ROCEPHIN)  1 g in sodium chloride 0.9 % 100 mL IVPB     1 g 200 mL/hr over 30 Minutes Intravenous Every 24 hours 07/25/19 0734 07/29/19 1518       Subjective: Patient seen and examined at bedside.  Poor historian.  Hardly participates in conversation although he wakes up slightly.  No overnight fever or vomiting.  Objective: Vitals:   08/06/19 2243 08/07/19 0032 08/07/19 0445 08/07/19 0500  BP: (!) 153/76 (!) 141/99 130/70   Pulse: 76 67 81   Resp:  18  15  Temp:  97.9 F (36.6 C) 98.6 F (37 C)   TempSrc:  Axillary Oral   SpO2:  100% 100%   Weight:    64.4 kg    Intake/Output Summary (Last 24 hours) at 08/07/2019 0713 Last data filed at 08/07/2019 0625 Gross per 24 hour  Intake 120 ml  Output 1200 ml  Net -1080 ml   Filed Weights   08/04/19 0325 08/06/19 0555 08/07/19 0500  Weight: 66.5 kg 63.4 kg 64.4 kg    Examination:  General exam: No distress.  Wakes up slightly, hardly answers any questions.    Respiratory system: Bilateral decreased breath sounds at bases, no wheezing cardiovascular system: Rate controlled, S1-S2 heard gastrointestinal system: Abdomen is nondistended, soft and nontender. Normal bowel sounds heard. Extremities: No cyanosis, edema    Data Reviewed: I have personally reviewed following labs and imaging studies  CBC: Recent Labs  Lab 08/02/19 0414 08/03/19 0422 08/04/19 0320 08/06/19 0401  WBC 11.9* 10.4 8.8 8.6  NEUTROABS 7.6 6.7 5.5 5.5  HGB 10.1* 9.7* 9.8* 10.3*  HCT 32.1* 31.2* 31.3* 32.6*  MCV 93.6 96.3 94.0 95.0  PLT 214 212 231 607   Basic Metabolic Panel: Recent Labs  Lab 08/02/19 0414 08/03/19 0422 08/04/19 0320 08/05/19 0413 08/06/19 0401  NA 141 139 141 142 141  K 3.8 3.5 3.2* 3.3* 3.5  CL 110 109 110 108 109  CO2 20* 19* _0 GLUCOSE 81 85 85 88 89  BUN 41* 36* 29* 24* 23*  CREATININE 2.02* 1.89* 1.55* 1.45* 1.48*  CALCIUM 8.7* 8.6* 8.6* 8.9 8.8*  MG 1.6* 1.5* 1.7 1.6* 1.8   GFR: CrCl cannot be calculated (Unknown  ideal weight.). Liver Function Tests: Recent Labs  Lab 08/01/19 0356 08/02/19 0414 08/03/19 0422 08/04/19 0320  AST 92* 85* 85* 85*  ALT 114* 93* 84* 79*  ALKPHOS 76 68 70 72  BILITOT 3.4* 2.8* 3.1* 2.3*  PROT 6.3* 6.1* 6.2* 6.5  ALBUMIN 2.8* 2.6* 2.6* 2.6*   No results for input(s): LIPASE, AMYLASE in the last 168 hours. No results for input(s): AMMONIA in the last 168 hours. Coagulation Profile: No results for input(s): INR, PROTIME in the last 168 hours. Cardiac Enzymes: Recent Labs  Lab 08/01/19 0356 08/02/19 0414  CKTOTAL 293 190   BNP (last 3 results) No results for input(s): PROBNP in the last 8760 hours. HbA1C: No results for input(s): HGBA1C in the last 72 hours. CBG: Recent Labs  Lab 08/06/19 0855 08/06/19 1150 08/06/19 1647 08/06/19 2012 08/06/19 2251  GLUCAP 73 94 75 75 84   Lipid Profile: No results for input(s): CHOL, HDL, LDLCALC, TRIG, CHOLHDL, LDLDIRECT in the last 72 hours. Thyroid Function Tests: No results for input(s): TSH, T4TOTAL, FREET4, T3FREE, THYROIDAB in the last 72 hours. Anemia Panel: No results for input(s): VITAMINB12, FOLATE, FERRITIN, TIBC, IRON, RETICCTPCT in the last 72 hours. Sepsis Labs: No results for input(s): PROCALCITON, LATICACIDVEN in the  last 168 hours.  Recent Results (from the past 240 hour(s))  Culture, blood (Routine X 2) w Reflex to ID Panel     Status: None   Collection Time: 07/28/19 12:11 PM   Specimen: BLOOD LEFT ARM  Result Value Ref Range Status   Specimen Description BLOOD LEFT ARM  Final   Special Requests   Final    BOTTLES DRAWN AEROBIC AND ANAEROBIC Blood Culture adequate volume   Culture   Final    NO GROWTH 5 DAYS Performed at [facility], [address] [place], [place] [address]    Report Status 08/02/2019 FINAL  Final  Culture, blood (Routine X 2) w Reflex to ID Panel     Status: None   Collection Time: 07/28/19 12:12 PM   Specimen: BLOOD LEFT ARM  Result Value Ref Range Status    Specimen Description BLOOD LEFT ARM  Final   Special Requests   Final    BOTTLES DRAWN AEROBIC ONLY Blood Culture adequate volume   Culture   Final    NO GROWTH 5 DAYS Performed at [facility], [place] [address] [place], [place] [address]    Report Status 08/02/2019 FINAL  Final         Radiology Studies: No results found.      Scheduled Meds: .  stroke: mapping our early stages of recovery book   Does not apply Once  . amLODipine  10 mg Oral Daily  . aspirin  300 mg Rectal Daily   Or  . aspirin  325 mg Oral Daily  . chlorhexidine gluconate (MEDLINE KIT)  15 mL Mouth Rinse BID  . folic acid  1 mg Oral Daily  . heparin  5,000 Units Subcutaneous Q8H  . insulin aspart  0-15 Units Subcutaneous TID WC  . insulin aspart  0-5 Units Subcutaneous QHS  . lactulose  20 g Oral BID   Or  . lactulose  300 mL Rectal BID  . mouth rinse  15 mL Mouth Rinse 10 times per day  . metoprolol tartrate  25 mg Oral BID  . multivitamin  15 mL Oral Daily  . thiamine  100 mg Oral Daily   Or  . thiamine  100 mg Intravenous Daily   Continuous Infusions:    LOS: 15 days        Aline August, MD [facility] 08/07/2019, 7:13 AM

## 2019-08-07 NOTE — Progress Notes (Signed)
Occupational Therapy Treatment Patient Details Name: Tyrone AppleSteven Stelmach MRN: [id number] DOB: 03/16/1971 Today's Date: 08/07/2019    History of present illness Pt is a 48 y/o male transferred from [place] hospital for unresponsiveness, likely secondary to substance abuse. Pt with rhabdomyolysis and AKI. EEG revealed moderate diffuse encephalopathy. PMH includes polysubstance abuse and HTN.    OT comments  Pt continues to be lethargic this session. OT opening all the blinds in the room in an attempt to make room more alerting for pt. Cold wash cloth placed over his face to produce a reactions but pt did not make any attempts to remove cloth. Pt unable to follow max multimodal cuing to wash face and needed hand over hand assistance to complete task. OT requesting pt to roll to the R and pt rolling into prone instead. Pt able to pull B UEs out from under him with min A and returning back to sleep. [facility]  Follow Up Recommendations  SNF;Supervision/Assistance - 24 hour    Equipment Recommendations  None recommended by OT       Precautions / Restrictions Precautions Precautions: Fall Restrictions Weight Bearing Restrictions: No       Mobility Bed Mobility Overal bed mobility: Needs Assistance Bed Mobility: Rolling Rolling: Supervision;Min guard         General bed mobility comments: pt rolling into prone position and able to pull arm out from under himself with min A  Transfers          General transfer comment: resistive to attempts- lethargic    Balance Overall balance assessment: Needs assistance Sitting-balance support: Bilateral upper extremity supported;Feet supported Sitting balance-Leahy Scale: Poor Sitting balance - Comments: not using protective ext but also resists to L side to return to bed              ADL either performed or assessed with clinical judgement   ADL       Grooming:  Total assistance;Bed level;Wash/dry face Grooming Details (indicate cue type and reason): total HOH assist                          Cognition Arousal/Alertness: Lethargic Behavior During Therapy: Flat affect Overall Cognitive Status: No family/caregiver present to determine baseline cognitive functioning Area of Impairment: Orientation;Attention;Memory;Following commands;Safety/judgement;Awareness;Problem solving        Orientation Level: Situation;Time;Place Current Attention Level: Selective Memory: Decreased recall of precautions;Decreased short-term memory Following Commands: Follows one step commands inconsistently Safety/Judgement: Decreased awareness of safety;Decreased awareness of deficits Awareness: Intellectual Problem Solving: Slow processing;Decreased initiation;Difficulty sequencing;Requires verbal cues;Requires tactile cues General Comments: max multimodal cuing                   Pertinent Vitals/ Pain       Pain Assessment: Faces Faces Pain Scale: No hurt         Frequency  Min 2X/week        Progress Toward Goals  OT Goals(current goals can now be found in the care plan section)   progressing towards goals  Acute Rehab OT Goals Patient Stated Goal: none stated OT Goal Formulation: Patient unable to participate in goal setting  Plan         AM-PAC OT "6 Clicks" Daily Activity     Outcome Measure   Help from another person eating meals?: A Lot Help from another person taking care of personal grooming?: A Lot Help  from another person toileting, which includes using toliet, bedpan, or urinal?: Total Help from another person bathing (including washing, rinsing, drying)?: Total Help from another person to put on and taking off regular upper body clothing?: Total Help from another person to put on and taking off regular lower body clothing?: Total 6 Click Score: 8    End of Session    OT Visit Diagnosis: Other abnormalities of gait  and mobility (R26.89);Other symptoms and signs involving cognitive function;Other symptoms and signs involving the nervous system (R29.898);Muscle weakness (generalized) (M62.81)   Activity Tolerance Patient limited by lethargy   Patient Left in bed;with call bell/phone within reach;with bed alarm set           Time: 1006-1018 OT Time Calculation (min): 12 min  Charges: OT General Charges $OT Visit: 1 Visit OT Treatments $Self Care/Home Management : 8-22 mins    Markise Haymer P, MS, OTR/L 08/07/2019, 10:49 AM

## 2019-08-07 NOTE — Progress Notes (Signed)
NIH and neuro exam impossible to complete accurately.  Patient will wake up, but will not look at me.  I asked what his name was and he asked what my name was.  I answered him and told him I was his nurse for the night.  I asked the question again and received no answer.  Patient was able to take cup from my hand without directions and did drink water unassisted.  Patient is clearly moving all extremities, with what appears to be equal strength.  When I attempted to perform hand grip and dorsi/plantar flexion pt did not cooperate.  I gave ample time for processing, but patient closed his eyes and looked to be sleeping.  Stimulation provided and directions given again, but without any improvement.  Later on, I asked patient to move his arm so that I could get to his PICC line and he moved it.  I am unable to tell if patient is demonstrating a processing deficit or if he is non-interactive and uncooperative.

## 2019-08-07 NOTE — TOC Progression Note (Signed)
Transition of Care [facility]) - Progression Note    Patient Details  Name: Tyler [name] MRN: [id number] Date of Birth: 04-07-1971  Transition of Care [facility]) CM/SW [place], LCSW Phone Number: 08/07/2019, 3:19 PM  Clinical Narrative:    CSW provided requested letter signed by MD to financial counseling. CSW spoke with patient's daughter. She requested placement near her in [facility]. CSW explained that patient has multiple barriers for placement and that we will need to accept whatever bed offer he receives. She stated understanding.    Expected Discharge Plan: Skilled Nursing Facility Barriers to Discharge: SNF Pending Medicaid, SNF Pending payor source - LOG, Active Substance Use - Placement  Expected Discharge Plan and Services Expected Discharge Plan: [place]   Discharge Planning Services: CM Consult, Other - See comment(Pt will need LOG for SNF placement) Post Acute Care Choice: [place] arrangements for the past 2 months: Single Family Home                 DME Arranged: N/A           HH Agency: NA         Social Determinants of Health (SDOH) Interventions    Readmission Risk Interventions No flowsheet data found.

## 2019-08-08 LAB — CBC WITH DIFFERENTIAL/PLATELET
Abs Immature Granulocytes: 0.05 10*3/uL (ref 0.00–0.07)
Basophils Absolute: 0.1 10*3/uL (ref 0.0–0.1)
Basophils Relative: 1 %
Eosinophils Absolute: 0.2 10*3/uL (ref 0.0–0.5)
Eosinophils Relative: 2 %
HCT: 33.2 % — ABNORMAL LOW (ref 39.0–52.0)
Hemoglobin: 10.5 g/dL — ABNORMAL LOW (ref 13.0–17.0)
Immature Granulocytes: 1 %
Lymphocytes Relative: 28 %
Lymphs Abs: 1.8 10*3/uL (ref 0.7–4.0)
MCH: 30.6 pg (ref 26.0–34.0)
MCHC: 31.6 g/dL (ref 30.0–36.0)
MCV: 96.8 fL (ref 80.0–100.0)
Monocytes Absolute: 0.7 10*3/uL (ref 0.1–1.0)
Monocytes Relative: 10 %
Neutro Abs: 3.9 10*3/uL (ref 1.7–7.7)
Neutrophils Relative %: 58 %
Platelets: 308 10*3/uL (ref 150–400)
RBC: 3.43 MIL/uL — ABNORMAL LOW (ref 4.22–5.81)
RDW: 17.2 % — ABNORMAL HIGH (ref 11.5–15.5)
WBC: 6.7 10*3/uL (ref 4.0–10.5)
nRBC: 0 % (ref 0.0–0.2)

## 2019-08-08 LAB — GLUCOSE, CAPILLARY
Glucose-Capillary: 81 mg/dL (ref 70–99)
Glucose-Capillary: 119 mg/dL — ABNORMAL HIGH (ref 70–99)
Glucose-Capillary: 89 mg/dL (ref 70–99)
Glucose-Capillary: 93 mg/dL (ref 70–99)

## 2019-08-08 LAB — MAGNESIUM: Magnesium: 1.7 mg/dL (ref 1.7–2.4)

## 2019-08-08 LAB — COMPREHENSIVE METABOLIC PANEL
ALT: 107 U/L — ABNORMAL HIGH (ref 0–44)
AST: 128 U/L — ABNORMAL HIGH (ref 15–41)
Albumin: 2.7 g/dL — ABNORMAL LOW (ref 3.5–5.0)
Alkaline Phosphatase: 73 U/L (ref 38–126)
Anion gap: 8 (ref 5–15)
BUN: 26 mg/dL — ABNORMAL HIGH (ref 6–20)
CO2: 24 mmol/L (ref 22–32)
Calcium: 9 mg/dL (ref 8.9–10.3)
Chloride: 108 mmol/L (ref 98–111)
Creatinine, Ser: 1.49 mg/dL — ABNORMAL HIGH (ref 0.61–1.24)
GFR calc Af Amer: >60 mL/min (ref >60)
GFR calc non Af Amer: 54 mL/min — ABNORMAL LOW (ref >60)
Glucose, Bld: 86 mg/dL (ref 70–99)
Potassium: 3.5 mmol/L (ref 3.5–5.1)
Sodium: 140 mmol/L (ref 135–145)
Total Bilirubin: 1.6 mg/dL — ABNORMAL HIGH (ref 0.3–1.2)
Total Protein: 7 g/dL (ref 6.5–8.1)

## 2019-08-08 MED ORDER — CHLORHEXIDINE GLUCONATE 0.12 % MT SOLN
OROMUCOSAL | Status: AC
  Administered 2019-08-08: 10:00:00 15 mL
  Filled 2019-08-08: qty 15

## 2019-08-08 NOTE — Progress Notes (Signed)
Patient ID: Tyler [name], male   DOB: 10-05-1971, 48 y.o.   MRN: [id number]  PROGRESS NOTE    Tyler [name]  JKD:[id number] DOB: 17-Aug-1971 DOA: 07/23/2019 PCP: Patient, No Pcp Per   Brief Narrative:  48 year old male with history of polysubstance abuse including heroin presented to [facility] ER after being found down with a syringe nearby.  He was given Narcan with minimal improvement.  Found in severe rhabdomyolysis with acute kidney injury and hyperkalemia.  He was started on IV fluids including bicarb drip and transferred to [facility] for further care.  Rhabdomyolysis improved but he had persistence of encephalopathy and was found to have acute bilateral cerebellar infarct.  Neurology was consulted.  Neurology has signed off.  PT recommends SNF placement.  Assessment & Plan: Acute metabolic encephalopathy: Multifactorial Still ongoing Most likely secondary to drug use, uremia, heavy alcohol use CT of the head and neck was initially negative for any traumatic finding UDS, Tylenol and salicylate levels were negative. TSH, folate, B12 levels all within normal limits EEG: Generalized diffuse slowing but no seizure activity Fall precautions  Acute bilateral CVA, nonhemorrhagic MRI of the brain showed acute CVA Neurology evaluated the patient and has signed off.  MRA of the head negative.  Ultrasound carotid showed bilateral 1 to 39% ICA stenosis Neurology recommends considering 30-day cardiac event monitoring as outpatient to rule out A. fib if patient neurologically improves LDL 83; A1c 5.1. Continue aspirin, will start statin once LFTs normalized. Echocardiogram: EF of 60 to 65% Lower extremity Dopplers: Negative for DVT Follow PT/OT/SLP recommendations.  PT recommends SNF.  Severe rhabdomyolysis/AKI Improving, creatinine peaked to 5.79 Renal ultrasound negative Daily CMP  Transaminitis with elevated bilirubin Ongoing Right upper quadrant ultrasound showed hepatic steatosis with  early cirrhosis with some gallbladder thickening Daily CMP  Alcohol abuse Advised to quit As per CIWA protocol Continue thiamine, multivitamin and folic acid.  Urinary tract infection Present on admission Completed 5 days of Rocephin  Essential hypertension Stable Continue amlodipine and metoprolol   DVT prophylaxis: Heparin Code Status: Full Family Communication: None at bedside Disposition Plan: SNF, difficult placement  Consultants: Neurology  Procedures:  Echo IMPRESSIONS    1. The left ventricle has normal systolic function with an ejection fraction of 60-65%. The cavity size was normal. Left ventricular diastolic Doppler parameters are consistent with impaired relaxation. No evidence of left ventricular regional wall  motion abnormalities.  2. The right ventricle has normal systolic function. The cavity was normal. There is no increase in right ventricular wall thickness. Right ventricular systolic pressure is normal with an estimated pressure of 21.0 mmHg.  3. The aortic valve is tricuspid. Moderate sclerosis of the aortic valve.  4. The aorta is normal unless otherwise noted.  Antimicrobials:  Anti-infectives (From admission, onward)   Start     Dose/Rate Route Frequency Ordered Stop   07/25/19 0745  cefTRIAXone (ROCEPHIN) 1 g in sodium chloride 0.9 % 100 mL IVPB     1 g 200 mL/hr over 30 Minutes Intravenous Every 24 hours 07/25/19 0734 07/29/19 1518       Subjective: Patient seen and examined at bedside, refused to answer any of my questions.  Not able to engage in conversations    Objective: Vitals:   08/08/19 0344 08/08/19 0755 08/08/19 1151 08/08/19 1611  BP:  (!) 150/91 (!) 155/88 134/73  Pulse:  71 67 70  Resp:   16 16  Temp:  98.5 F (36.9 C) 97.7 F (36.5 C) 97.8 F (36.6  C)  TempSrc:  Oral  Oral  SpO2:  100% 100% 100%  Weight: 64.3 kg       Intake/Output Summary (Last 24 hours) at 08/08/2019 1739 Last data filed at 08/08/2019 1133 Gross  per 24 hour  Intake 360 ml  Output 3675 ml  Net -3315 ml   Filed Weights   08/06/19 0555 08/07/19 0500 08/08/19 0344  Weight: 63.4 kg 64.4 kg 64.3 kg    Examination:  General: NAD, not conversant  Cardiovascular: S1, S2 present  Respiratory: CTAB  Abdomen: Soft, nontender, nondistended, bowel sounds present  Musculoskeletal: No bilateral pedal edema noted  Skin: Normal  Psychiatry: Normal mood    Data Reviewed: I have personally reviewed following labs and imaging studies  CBC: Recent Labs  Lab 08/02/19 0414 08/03/19 0422 08/04/19 0320 08/06/19 0401 08/08/19 0405  WBC 11.9* 10.4 8.8 8.6 6.7  NEUTROABS 7.6 6.7 5.5 5.5 3.9  HGB 10.1* 9.7* 9.8* 10.3* 10.5*  HCT 32.1* 31.2* 31.3* 32.6* 33.2*  MCV 93.6 96.3 94.0 95.0 96.8  PLT 214 212 231 299 654   Basic Metabolic Panel: Recent Labs  Lab 08/03/19 0422 08/04/19 0320 08/05/19 0413 08/06/19 0401 08/08/19 0405  NA 139 141 142 141 140  K 3.5 3.2* 3.3* 3.5 3.5  CL 109 110 108 109 108  CO2 19* _0 GLUCOSE 85 85 88 89 86  BUN 36* 29* 24* 23* 26*  CREATININE 1.89* 1.55* 1.45* 1.48* 1.49*  CALCIUM 8.6* 8.6* 8.9 8.8* 9.0  MG 1.5* 1.7 1.6* 1.8 1.7   GFR: CrCl cannot be calculated (Unknown ideal weight.). Liver Function Tests: Recent Labs  Lab 08/02/19 0414 08/03/19 0422 08/04/19 0320 08/08/19 0405  AST 85* 85* 85* 128*  ALT 93* 84* 79* 107*  ALKPHOS 68 70 72 73  BILITOT 2.8* 3.1* 2.3* 1.6*  PROT 6.1* 6.2* 6.5 7.0  ALBUMIN 2.6* 2.6* 2.6* 2.7*   No results for input(s): LIPASE, AMYLASE in the last 168 hours. No results for input(s): AMMONIA in the last 168 hours. Coagulation Profile: No results for input(s): INR, PROTIME in the last 168 hours. Cardiac Enzymes: Recent Labs  Lab 08/02/19 0414  CKTOTAL 190   BNP (last 3 results) No results for input(s): PROBNP in the last 8760 hours. HbA1C: No results for input(s): HGBA1C in the last 72 hours. CBG: Recent Labs  Lab 08/07/19 1706  08/07/19 2120 08/08/19 0745 08/08/19 1152 08/08/19 1653  GLUCAP 83 80 81 119* 89   Lipid Profile: No results for input(s): CHOL, HDL, LDLCALC, TRIG, CHOLHDL, LDLDIRECT in the last 72 hours. Thyroid Function Tests: No results for input(s): TSH, T4TOTAL, FREET4, T3FREE, THYROIDAB in the last 72 hours. Anemia Panel: No results for input(s): VITAMINB12, FOLATE, FERRITIN, TIBC, IRON, RETICCTPCT in the last 72 hours. Sepsis Labs: No results for input(s): PROCALCITON, LATICACIDVEN in the last 168 hours.  No results found for this or any previous visit (from the past 240 hour(s)).       Radiology Studies: No results found.      Scheduled Meds: .  stroke: mapping our early stages of recovery book   Does not apply Once  . amLODipine  10 mg Oral Daily  . aspirin  300 mg Rectal Daily   Or  . aspirin  325 mg Oral Daily  . chlorhexidine gluconate (MEDLINE KIT)  15 mL Mouth Rinse BID  . folic acid  1 mg Oral Daily  . heparin  5,000 Units Subcutaneous Q8H  . insulin aspart  0-15 Units Subcutaneous TID WC  . insulin aspart  0-5 Units Subcutaneous QHS  . lactulose  20 g Oral BID   Or  . lactulose  300 mL Rectal BID  . mouth rinse  15 mL Mouth Rinse BID  . metoprolol tartrate  25 mg Oral BID  . multivitamin  15 mL Oral Daily  . thiamine  100 mg Oral Daily   Or  . thiamine  100 mg Intravenous Daily   Continuous Infusions:    LOS: 16 days        Alma Friendly, MD [facility] 08/08/2019, 5:39 PM

## 2019-08-09 DIAGNOSIS — E43 Unspecified severe protein-calorie malnutrition: Secondary | ICD-10-CM

## 2019-08-09 HISTORY — DX: Unspecified severe protein-calorie malnutrition: E43

## 2019-08-09 LAB — GLUCOSE, CAPILLARY
Glucose-Capillary: 84 mg/dL (ref 70–99)
Glucose-Capillary: 131 mg/dL — ABNORMAL HIGH (ref 70–99)
Glucose-Capillary: 114 mg/dL — ABNORMAL HIGH (ref 70–99)
Glucose-Capillary: 106 mg/dL — ABNORMAL HIGH (ref 70–99)

## 2019-08-09 LAB — CBC WITH DIFFERENTIAL/PLATELET
Abs Immature Granulocytes: 0.03 10*3/uL (ref 0.00–0.07)
Basophils Absolute: 0.1 10*3/uL (ref 0.0–0.1)
Basophils Relative: 1 %
Eosinophils Absolute: 0.1 10*3/uL (ref 0.0–0.5)
Eosinophils Relative: 2 %
HCT: 35.1 % — ABNORMAL LOW (ref 39.0–52.0)
Hemoglobin: 11.3 g/dL — ABNORMAL LOW (ref 13.0–17.0)
Immature Granulocytes: 0 %
Lymphocytes Relative: 29 %
Lymphs Abs: 2 10*3/uL (ref 0.7–4.0)
MCH: 30.9 pg (ref 26.0–34.0)
MCHC: 32.2 g/dL (ref 30.0–36.0)
MCV: 95.9 fL (ref 80.0–100.0)
Monocytes Absolute: 0.7 10*3/uL (ref 0.1–1.0)
Monocytes Relative: 10 %
Neutro Abs: 4 10*3/uL (ref 1.7–7.7)
Neutrophils Relative %: 58 %
Platelets: 329 10*3/uL (ref 150–400)
RBC: 3.66 MIL/uL — ABNORMAL LOW (ref 4.22–5.81)
RDW: 17.2 % — ABNORMAL HIGH (ref 11.5–15.5)
WBC: 6.8 10*3/uL (ref 4.0–10.5)
nRBC: 0 % (ref 0.0–0.2)

## 2019-08-09 LAB — COMPREHENSIVE METABOLIC PANEL
ALT: 136 U/L — ABNORMAL HIGH (ref 0–44)
AST: 159 U/L — ABNORMAL HIGH (ref 15–41)
Albumin: 3.1 g/dL — ABNORMAL LOW (ref 3.5–5.0)
Alkaline Phosphatase: 81 U/L (ref 38–126)
Anion gap: 11 (ref 5–15)
BUN: 26 mg/dL — ABNORMAL HIGH (ref 6–20)
CO2: 25 mmol/L (ref 22–32)
Calcium: 9.1 mg/dL (ref 8.9–10.3)
Chloride: 104 mmol/L (ref 98–111)
Creatinine, Ser: 1.44 mg/dL — ABNORMAL HIGH (ref 0.61–1.24)
GFR calc Af Amer: >60 mL/min (ref >60)
GFR calc non Af Amer: 56 mL/min — ABNORMAL LOW (ref >60)
Glucose, Bld: 95 mg/dL (ref 70–99)
Potassium: 3.5 mmol/L (ref 3.5–5.1)
Sodium: 140 mmol/L (ref 135–145)
Total Bilirubin: 1.2 mg/dL (ref 0.3–1.2)
Total Protein: 7.7 g/dL (ref 6.5–8.1)

## 2019-08-09 MED ORDER — CHLORHEXIDINE GLUCONATE 0.12 % MT SOLN
OROMUCOSAL | Status: AC
  Administered 2019-08-09: 08:00:00
  Filled 2019-08-09: qty 15

## 2019-08-09 MED ORDER — ENSURE ENLIVE PO LIQD
237.0000 mL | Freq: Three times a day (TID) | ORAL | Status: DC
  Administered 2019-08-09 – 2019-08-17 (×17): 237 mL via ORAL
  Filled 2019-08-09: qty 237

## 2019-08-09 MED ORDER — ADULT MULTIVITAMIN W/MINERALS CH
1.0000 | ORAL_TABLET | Freq: Every day | ORAL | Status: DC
  Administered 2019-08-09 – 2019-12-16 (×111): 1 via ORAL
  Filled 2019-08-09 (×121): qty 1

## 2019-08-09 NOTE — Progress Notes (Signed)
Initial Nutrition Assessment  DOCUMENTATION CODES:   Severe malnutrition in context of acute illness/injury  INTERVENTION:   -Ensure Enlive po TID, each supplement provides 350 kcal and 20 grams of protein -MVI with minerals daily -Downgrade diet to dysphagia 3 (advanced mechanical soft) for ease of intake -Magic cup TID with meals, each supplement provides 290 kcal and 9 grams of protein -Hormel shake TID with meals, each supplement provides 520 kcals and 22 grams protein -If poor oral intake persists, consider initiation of nutrition support (ex TF via cortrak/ NGT)  NUTRITION DIAGNOSIS:   Severe Malnutrition related to acute illness(acute toxic/metabolic encephalopathy) as evidenced by moderate fat depletion, severe fat depletion, moderate muscle depletion, severe muscle depletion, energy intake < or equal to 50% for > or equal to 5 days, percent weight loss.  GOAL:   Patient will meet greater than or equal to 90% of their needs  MONITOR:   PO intake, Supplement acceptance, Labs, Weight trends, Skin, I & O's  REASON FOR ASSESSMENT:   LOS    ASSESSMENT:   Tyrone AppleSteven Howington is a 48 y.o. male with medical history significant of polysubstance abuse including heroin.  [facility] just before MN after being found down by friend last evening with nearby syringe.  Pt admitted with rhabdomyolysis and acute toxic/ metabolic encephalopathy (?OD).   8/23- PICC placed 8/26- MRI of brain revealed acute bilateral CVA  Pt very drowsy at time of visit. He woke up when name was called and was cooperative with simple commands, however, unable to provide hisotry. Pt was able to respond to simple, close ended questions. Pt responded "yeah" to most questions or would repeat the last 1-2 words that this RD said. Attempted to engage pt with family photographs attached to wall, but pt would mainly smile, respond "yeah", or repeat last word RD said.   Reviewed meal completion records and pt  has been consuming mainly 0% of meals over the past week, however, did consume 60-75% meals 1-2 times during this period. Unsure of oral intake PTA, however, suspect decreased intake and poor diet quality given history of drug and alcohol abuse. Per RNCM notes, pt with poor living conditions PTA, residing in a home with bed bugs, mold, and dirty water.   Obtained wt via bedscale (62.4 kg). Per hospital records, pt has experienced a 8% wt loss over the past week, which is significant for time frame. Suspect pt's malnutrition is chronic in nature, however, has worsened secondary to acute illness.   Noted pt with poor dentition (pt with appearance of dental caries- black areas on gums and top of teeth). Pt denied any difficulty chewing or swallowing foods, but would likely benefit from diet downgrade for energy conservation.   Per CSW notes, pt is difficult to place given polysubstance abuse and uninsured status; awaiting SNF placement via LOG.   Labs reviewed: CBGS: 84-119  NUTRITION - FOCUSED PHYSICAL EXAM:    Most Recent Value  Orbital Region  Severe depletion  Upper Arm Region  Moderate depletion  Thoracic and Lumbar Region  Moderate depletion  Buccal Region  Moderate depletion  Temple Region  Severe depletion  Clavicle Bone Region  Moderate depletion  Clavicle and Acromion Bone Region  Moderate depletion  Scapular Bone Region  Moderate depletion  Dorsal Hand  Moderate depletion  Patellar Region  Moderate depletion  Anterior Thigh Region  Moderate depletion  Posterior Calf Region  Moderate depletion  Edema (RD Assessment)  None  Hair  Reviewed  Eyes  Reviewed  Mouth  Reviewed  Skin  Reviewed  Nails  Reviewed       Diet Order:   Diet Order            DIET DYS 3 Room service appropriate? No; Fluid consistency: Thin  Diet effective now              EDUCATION NEEDS:   Not appropriate for education at this time  Skin:  Skin Assessment: Reviewed RN Assessment  Last BM:   08/04/19  Height:   Ht Readings from Last 1 Encounters:  08/09/19 5\' 8"  (1.727 m)    Weight:   Wt Readings from Last 1 Encounters:  08/09/19 62.4 kg    Ideal Body Weight:  70 kg  BMI:  Body mass index is 20.92 kg/m.  Estimated Nutritional Needs:   Kcal:  2000-2200  Protein:  105-120 grams  Fluid:  > 2 L    Ajane Novella A. Jimmye Norman, RD, LDN, [place] Registered Dietitian II Certified Diabetes Care and Education Specialist Pager: [phone] After hours Pager: [phone]

## 2019-08-09 NOTE — Progress Notes (Signed)
Patient ID: Tyler [name], male   DOB: 05/04/1971, 48 y.o.   MRN: [id number]  PROGRESS NOTE    Hatim Repsher  MRN:[id number] DOB: 07/20/1971 DOA: 07/23/2019 PCP: Patient, No Pcp Per   Brief Narrative:  48-year-old male with history of polysubstance abuse including heroin presented to [place] ER after being found down with a syringe nearby.  He was given Narcan with minimal improvement.  Found in severe rhabdomyolysis with acute kidney injury and hyperkalemia.  He was started on IV fluids including bicarb drip and transferred to [place] for further care.  Rhabdomyolysis improved but he had persistence of encephalopathy and was found to have acute bilateral cerebellar infarct.  Neurology was consulted.  Neurology has signed off.  PT recommends SNF placement.  Assessment & Plan: Acute metabolic encephalopathy: Multifactorial Still ongoing Most likely secondary to drug use, uremia, heavy alcohol use CT of the head and neck was initially negative for any traumatic finding UDS, Tylenol and salicylate levels were negative. TSH, folate, B12 levels all within normal limits EEG: Generalized diffuse slowing but no seizure activity Fall precautions  Acute bilateral CVA, nonhemorrhagic MRI of the brain showed acute CVA Neurology evaluated the patient and has signed off.  MRA of the head negative.  Ultrasound carotid showed bilateral 1 to 39% ICA stenosis Neurology recommends considering 30-day cardiac event monitoring as outpatient to rule out A. fib if patient neurologically improves LDL 83; A1c 5.1. Continue aspirin, will start statin once LFTs normalized. Echocardiogram: EF of 60 to 65% Lower extremity Dopplers: Negative for DVT Follow PT/OT/SLP recommendations.  PT recommends SNF.  Severe rhabdomyolysis/AKI Improving, creatinine peaked to 5.79 Renal ultrasound negative Daily CMP  Transaminitis with elevated bilirubin Ongoing Right upper quadrant ultrasound showed hepatic steatosis with  early cirrhosis with some gallbladder thickening Daily CMP  Alcohol abuse Advised to quit As per CIWA protocol Continue thiamine, multivitamin and folic acid.  Urinary tract infection Present on admission Completed 5 days of Rocephin  Essential hypertension Stable Continue amlodipine and metoprolol   DVT prophylaxis: Heparin Code Status: Full Family Communication: None at bedside Disposition Plan: SNF, difficult placement  Consultants: Neurology  Procedures:  Echo IMPRESSIONS    1. The left ventricle has normal systolic function with an ejection fraction of 60-65%. The cavity size was normal. Left ventricular diastolic Doppler parameters are consistent with impaired relaxation. No evidence of left ventricular regional wall  motion abnormalities.  2. The right ventricle has normal systolic function. The cavity was normal. There is no increase in right ventricular wall thickness. Right ventricular systolic pressure is normal with an estimated pressure of 21.0 mmHg.  3. The aortic valve is tricuspid. Moderate sclerosis of the aortic valve.  4. The aorta is normal unless otherwise noted.  Antimicrobials:  Anti-infectives (From admission, onward)   Start     Dose/Rate Route Frequency Ordered Stop   07/25/19 0745  cefTRIAXone (ROCEPHIN) 1 g in sodium chloride 0.9 % 100 mL IVPB     1 g 200 mL/hr over 30 Minutes Intravenous Every 24 hours 07/25/19 0734 07/29/19 1518       Subjective: Patient mostly sleeping, covering face, refusing to answer questions.    Objective: Vitals:   08/09/19 0037 08/09/19 0446 08/09/19 0509 08/09/19 1202  BP: (!) 170/95 (!) 173/99    Pulse: (!) 59 68    Resp: 18 18    Temp: 97.8 F (36.6 C) 97.6 F (36.4 C)    TempSrc:      SpO2: 100% 97%      Weight:   62.2 kg 62.4 kg  Height:    5' 8" (1.727 m)    Intake/Output Summary (Last 24 hours) at 08/09/2019 1455 Last data filed at 08/09/2019 0800 Gross per 24 hour  Intake 100 ml  Output 300  ml  Net -200 ml   Filed Weights   08/08/19 0344 08/09/19 0509 08/09/19 1202  Weight: 64.3 kg 62.2 kg 62.4 kg    Examination:  General: NAD, not conversant  Cardiovascular: S1, S2 present  Respiratory: CTAB  Abdomen: Soft, nontender, nondistended, bowel sounds present  Musculoskeletal: No bilateral pedal edema noted  Skin: Normal  Psychiatry: Unable to assess    Data Reviewed: I have personally reviewed following labs and imaging studies  CBC: Recent Labs  Lab 08/03/19 0422 08/04/19 0320 08/06/19 0401 08/08/19 0405 08/09/19 0423  WBC 10.4 8.8 8.6 6.7 6.8  NEUTROABS 6.7 5.5 5.5 3.9 4.0  HGB 9.7* 9.8* 10.3* 10.5* 11.3*  HCT 31.2* 31.3* 32.6* 33.2* 35.1*  MCV 96.3 94.0 95.0 96.8 95.9  PLT 212 231 299 308 989   Basic Metabolic Panel: Recent Labs  Lab 08/03/19 0422 08/04/19 0320 08/05/19 0413 08/06/19 0401 08/08/19 0405 08/09/19 0423  NA 139 141 142 141 140 140  K 3.5 3.2* 3.3* 3.5 3.5 3.5  CL 109 110 108 109 108 104  CO2 19* _0 GLUCOSE 85 85 88 89 86 95  BUN 36* 29* 24* 23* 26* 26*  CREATININE 1.89* 1.55* 1.45* 1.48* 1.49* 1.44*  CALCIUM 8.6* 8.6* 8.9 8.8* 9.0 9.1  MG 1.5* 1.7 1.6* 1.8 1.7  --    GFR: Estimated Creatinine Clearance: 54.2 mL/min (A) (by C-G formula based on SCr of 1.44 mg/dL (H)). Liver Function Tests: Recent Labs  Lab 08/03/19 0422 08/04/19 0320 08/08/19 0405 08/09/19 0423  AST 85* 85* 128* 159*  ALT 84* 79* 107* 136*  ALKPHOS 70 72 73 81  BILITOT 3.1* 2.3* 1.6* 1.2  PROT 6.2* 6.5 7.0 7.7  ALBUMIN 2.6* 2.6* 2.7* 3.1*   No results for input(s): LIPASE, AMYLASE in the last 168 hours. No results for input(s): AMMONIA in the last 168 hours. Coagulation Profile: No results for input(s): INR, PROTIME in the last 168 hours. Cardiac Enzymes: No results for input(s): CKTOTAL, CKMB, CKMBINDEX, TROPONINI in the last 168 hours. BNP (last 3 results) No results for input(s): PROBNP in the last 8760 hours. HbA1C: No  results for input(s): HGBA1C in the last 72 hours. CBG: Recent Labs  Lab 08/08/19 1152 08/08/19 1653 08/08/19 2220 08/09/19 0805 08/09/19 1211  GLUCAP 119* 89 93 84 131*   Lipid Profile: No results for input(s): CHOL, HDL, LDLCALC, TRIG, CHOLHDL, LDLDIRECT in the last 72 hours. Thyroid Function Tests: No results for input(s): TSH, T4TOTAL, FREET4, T3FREE, THYROIDAB in the last 72 hours. Anemia Panel: No results for input(s): VITAMINB12, FOLATE, FERRITIN, TIBC, IRON, RETICCTPCT in the last 72 hours. Sepsis Labs: No results for input(s): PROCALCITON, LATICACIDVEN in the last 168 hours.  No results found for this or any previous visit (from the past 240 hour(s)).       Radiology Studies: No results found.      Scheduled Meds: .  stroke: mapping our early stages of recovery book   Does not apply Once  . amLODipine  10 mg Oral Daily  . aspirin  300 mg Rectal Daily   Or  . aspirin  325 mg Oral Daily  . chlorhexidine gluconate (MEDLINE KIT)  15 mL Mouth Rinse BID  .  feeding supplement (ENSURE ENLIVE)  237 mL Oral TID BM  . folic acid  1 mg Oral Daily  . heparin  5,000 Units Subcutaneous Q8H  . insulin aspart  0-15 Units Subcutaneous TID WC  . insulin aspart  0-5 Units Subcutaneous QHS  . lactulose  20 g Oral BID   Or  . lactulose  300 mL Rectal BID  . mouth rinse  15 mL Mouth Rinse BID  . metoprolol tartrate  25 mg Oral BID  . multivitamin with minerals  1 tablet Oral Daily  . thiamine  100 mg Oral Daily   Or  . thiamine  100 mg Intravenous Daily   Continuous Infusions:    LOS: 17 days         J , MD [facility] 08/09/2019, 2:55 PM   

## 2019-08-09 NOTE — Progress Notes (Signed)
Physical Therapy Treatment Patient Details Name: Tyler [name] MRN: [id number] DOB: [birth year] Today's Date: 08/09/2019    History of Present Illness Pt is a 48 y/o male transferred from [place] for unresponsiveness, likely secondary to substance abuse. Pt with rhabdomyolysis and AKI. EEG revealed moderate diffuse encephalopathy. PMH includes polysubstance abuse and HTN.     PT Comments    Pt is more interactive verbally today but very inconsistent with active use of LE's and with assist to stand.  He is quite a bit more able to move, but is not connecting the engagement of leg mm's with cues.  Follow on for as much strengthening and transfer training as pt can perform to increase his ability coming into rehab and to reduce stay length in SNF.     Follow Up Recommendations  SNF;Supervision for mobility/OOB     Equipment Recommendations  None recommended by PT    Recommendations for Other Services       Precautions / Restrictions Precautions Precautions: Fall Precaution Comments: poor safety awareness Restrictions Weight Bearing Restrictions: No    Mobility  Bed Mobility Overal bed mobility: Needs Assistance Bed Mobility: Rolling;Supine to Sit;Sit to Supine Rolling: Mod assist   Supine to sit: Min assist Sit to supine: Min assist   General bed mobility comments: pt is able to help move but is inconsistent about the follow through  Transfers Overall transfer level: Needs assistance Equipment used: 1 person hand held assist Transfers: Sit to/from Stand Sit to Stand: Total assist;From elevated surface         General transfer comment: pt did not engage LE mm's to assist  Ambulation/Gait             General Gait Details: unable   Stairs             Wheelchair Mobility    Modified Rankin (Stroke Patients Only)       Balance Overall balance assessment: Needs assistance Sitting-balance support: Bilateral upper extremity supported;Feet  supported Sitting balance-Leahy Scale: Fair     Standing balance support: Bilateral upper extremity supported Standing balance-Leahy Scale: Zero                              Cognition Arousal/Alertness: Awake/alert Behavior During Therapy: Flat affect Overall Cognitive Status: Impaired/Different from baseline Area of Impairment: Orientation;Attention;Memory;Following commands;Safety/judgement;Awareness;Problem solving                 Orientation Level: Time;Situation Current Attention Level: Selective Memory: Decreased recall of precautions;Decreased short-term memory Following Commands: Follows one step commands inconsistently;Follows one step commands with increased time Safety/Judgement: Decreased awareness of deficits;Decreased awareness of safety Awareness: Intellectual Problem Solving: Slow processing;Decreased initiation;Requires verbal cues;Requires tactile cues General Comments: pt is not following any cues after first three reps to extend R knee      Exercises General Exercises - Lower Extremity Long Arc Quad: AAROM;Both;PROM;10 reps Heel Slides: AAROM;PROM;Both;10 reps Hip Flexion/Marching: AAROM;PROM;Both;10 reps    General Comments        Pertinent Vitals/Pain Pain Assessment: Faces Faces Pain Scale: No hurt    Home Living                      Prior Function            PT Goals (current goals can now be found in the care plan section) Acute Rehab PT Goals Patient Stated Goal: none stated Progress towards PT goals:  Progressing toward goals    Frequency    Min 2X/week      PT Plan Current plan remains appropriate    Co-evaluation              AM-PAC PT "6 Clicks" Mobility   Outcome Measure  Help needed turning from your back to your side while in a flat bed without using bedrails?: A Lot Help needed moving from lying on your back to sitting on the side of a flat bed without using bedrails?: A Little Help  needed moving to and from a bed to a chair (including a wheelchair)?: A Little Help needed standing up from a chair using your arms (e.g., wheelchair or bedside chair)?: Total Help needed to walk in hospital room?: Total Help needed climbing 3-5 steps with a railing? : Total 6 Click Score: 11    End of Session   Activity Tolerance: Patient tolerated treatment well Patient left: in bed;with call bell/phone within reach;with bed alarm set;with nursing/sitter in room Nurse Communication: Mobility status PT Visit Diagnosis: Other abnormalities of gait and mobility (R26.89);Difficulty in walking, not elsewhere classified (R26.2)     Time: [id number] PT Time Calculation (min) (ACUTE ONLY): 26 min  Charges:  $Therapeutic Exercise: 8-22 mins $Therapeutic Activity: 8-22 mins                   Ivar DrapeRuth E Vallory Oetken 08/09/2019, 3:56 PM   Samul Dadauth Tammera Engert, PT MS Acute Rehab Dept. Number: [facility] [phone] and [facility] [phone]

## 2019-08-09 NOTE — TOC Progression Note (Signed)
Transition of Care [facility]) - Progression Note    Patient Details  Name: Tyler [name] MRN: [id number] Date of Birth: [birth year]  Transition of Care [facility]) CM/SW [place], [place] Phone Number: 08/09/2019, 3:57 PM  Clinical Narrative:    Patient does not have any SNF bed offers at this time. CSW to continue to follow.    Expected Discharge Plan: Skilled Nursing Facility Barriers to Discharge: SNF Pending Medicaid, SNF Pending payor source - LOG, Active Substance Use - Placement  Expected Discharge Plan and Services Expected Discharge Plan: [place]   Discharge Planning Services: CM Consult, Other - See comment(Pt will need LOG for SNF placement) Post Acute Care Choice: [place] arrangements for the past 2 months: Single Family Home                 DME Arranged: N/A           HH Agency: NA         Social Determinants of Health (SDOH) Interventions    Readmission Risk Interventions No flowsheet data found.

## 2019-08-10 LAB — COMPREHENSIVE METABOLIC PANEL
ALT: 150 U/L — ABNORMAL HIGH (ref 0–44)
AST: 174 U/L — ABNORMAL HIGH (ref 15–41)
Albumin: 2.9 g/dL — ABNORMAL LOW (ref 3.5–5.0)
Alkaline Phosphatase: 81 U/L (ref 38–126)
Anion gap: 10 (ref 5–15)
BUN: 34 mg/dL — ABNORMAL HIGH (ref 6–20)
CO2: 26 mmol/L (ref 22–32)
Calcium: 9.3 mg/dL (ref 8.9–10.3)
Chloride: 104 mmol/L (ref 98–111)
Creatinine, Ser: 1.8 mg/dL — ABNORMAL HIGH (ref 0.61–1.24)
GFR calc Af Amer: 50 mL/min — ABNORMAL LOW (ref >60)
GFR calc non Af Amer: 43 mL/min — ABNORMAL LOW (ref >60)
Glucose, Bld: 96 mg/dL (ref 70–99)
Potassium: 3.7 mmol/L (ref 3.5–5.1)
Sodium: 140 mmol/L (ref 135–145)
Total Bilirubin: 1 mg/dL (ref 0.3–1.2)
Total Protein: 7 g/dL (ref 6.5–8.1)

## 2019-08-10 LAB — GLUCOSE, CAPILLARY
Glucose-Capillary: 87 mg/dL (ref 70–99)
Glucose-Capillary: 137 mg/dL — ABNORMAL HIGH (ref 70–99)
Glucose-Capillary: 105 mg/dL — ABNORMAL HIGH (ref 70–99)
Glucose-Capillary: 81 mg/dL (ref 70–99)

## 2019-08-10 MED ORDER — CHLORHEXIDINE GLUCONATE CLOTH 2 % EX PADS
6.0000 | MEDICATED_PAD | Freq: Every day | CUTANEOUS | Status: DC
  Administered 2019-08-11 – 2019-08-13 (×3): 6 via TOPICAL

## 2019-08-10 MED ORDER — SODIUM CHLORIDE 0.9 % IV SOLN
INTRAVENOUS | Status: AC
  Administered 2019-08-10 – 2019-08-11 (×3): via INTRAVENOUS

## 2019-08-10 NOTE — Progress Notes (Signed)
  Speech Language Pathology Treatment: Cognitive-Linquistic  Patient Details Name: Tyler [name] MRN: [id number] DOB: 12-Feb-1971 Today's Date: 08/10/2019 Time: 4431-5400 SLP Time Calculation (min) (ACUTE ONLY): 14 min  Assessment / Plan / Recommendation Clinical Impression  Pt was seen for cognitive-linguistic treatment session. He was alert and moderately cooperative but the session was abbreviated per his request since he stated that he was tired and then began closing his eyes in order to sleep. He was re-oriented to place but was unable to recall this at the end of the session despite cues. He achieved 60% accuracy with complex yes/no questions. He completed 3-item immediate recall with 100% accuracy and 50% accuracy with 4-item immediate recall increasing to 100% with mod cues. Moderate support was needed for attention and reasoning during the session. SLP will continue to follow pt.    HPI HPI: Pt is a 48 y.o. male with medical history significant of polysubstance abuse including heroin who was brought in to [facility] ED on 07/23/19 after being found down by friend with nearby syringe. He was transferred to [facility] for further care. MRI of the brain of 07/25/19 showed patchy acute bilateral cerebral and cerebellar infarcts. Right temporal lobe encephalomalacia and chronic cerebral white matter disease.       SLP Plan  Continue with current plan of care       Recommendations                   Follow up Recommendations: Skilled Nursing facility;24 hour supervision/assistance SLP Visit Diagnosis: Cognitive communication deficit (Q67.619) Plan: Continue with current plan of care       Tyler [name] I. Hardin Negus, [place], [place] Office number [phone] Pager [phone]                Horton Marshall 08/10/2019, 2:05 PM

## 2019-08-10 NOTE — Plan of Care (Signed)
  Problem: Nutrition: Goal: Adequate nutrition will be maintained Outcome: Not Progressing  Poor appetite. Patient assisted with meals.

## 2019-08-10 NOTE — Progress Notes (Signed)
Patient ID: Tyler [name], male   DOB: 1971-10-09, 48 y.o.   MRN: [id number]  PROGRESS NOTE    Tyler [name]  BLT:[id number] DOB: 03-Jul-1971 DOA: 07/23/2019 PCP: Patient, No Pcp Per   Brief Narrative:  48 year old male with history of polysubstance abuse including heroin presented to [facility] ER after being found down with a syringe nearby.  He was given Narcan with minimal improvement.  Found in severe rhabdomyolysis with acute kidney injury and hyperkalemia.  He was started on IV fluids including bicarb drip and transferred to [facility] for further care.  Rhabdomyolysis improved but he had persistence of encephalopathy and was found to have acute bilateral cerebellar infarct.  Neurology was consulted.  Neurology has signed off.  PT recommends SNF placement.  Assessment & Plan: Acute metabolic encephalopathy: Multifactorial Still ongoing, not verbally responding to questions asked Most likely secondary to drug use, uremia, heavy alcohol use CT of the head and neck was initially negative for any traumatic finding UDS, Tylenol and salicylate levels were negative. TSH, folate, B12 levels all within normal limits EEG: Generalized diffuse slowing but no seizure activity Fall precautions  Acute bilateral CVA, nonhemorrhagic MRI of the brain showed acute CVA Neurology evaluated the patient and has signed off.  MRA of the head negative.  Ultrasound carotid showed bilateral 1 to 39% ICA stenosis Neurology recommends considering 30-day cardiac event monitoring as outpatient to rule out A. fib if patient neurologically improves LDL 83; A1c 5.1. Continue aspirin, will start statin once LFTs normalized. Echocardiogram: EF of 60 to 65% Lower extremity Dopplers: Negative for DVT Follow PT/OT/SLP recommendations.  PT recommends SNF.  Severe rhabdomyolysis/AKI Improving, creatinine peaked to 5.79 Renal ultrasound negative Daily CMP  Transaminitis with elevated bilirubin Ongoing Right upper  quadrant ultrasound showed hepatic steatosis with early cirrhosis with some gallbladder thickening Daily CMP  Alcohol abuse Advised to quit As per CIWA protocol Continue thiamine, multivitamin and folic acid.  Urinary tract infection Present on admission Completed 5 days of Rocephin  Essential hypertension Stable Continue amlodipine and metoprolol   DVT prophylaxis: Heparin Code Status: Full Family Communication: None at bedside Disposition Plan: SNF, difficult placement  Consultants: Neurology  Procedures:  Echo IMPRESSIONS    1. The left ventricle has normal systolic function with an ejection fraction of 60-65%. The cavity size was normal. Left ventricular diastolic Doppler parameters are consistent with impaired relaxation. No evidence of left ventricular regional wall  motion abnormalities.  2. The right ventricle has normal systolic function. The cavity was normal. There is no increase in right ventricular wall thickness. Right ventricular systolic pressure is normal with an estimated pressure of 21.0 mmHg.  3. The aortic valve is tricuspid. Moderate sclerosis of the aortic valve.  4. The aorta is normal unless otherwise noted.  Antimicrobials:  Anti-infectives (From admission, onward)   Start     Dose/Rate Route Frequency Ordered Stop   07/25/19 0745  cefTRIAXone (ROCEPHIN) 1 g in sodium chloride 0.9 % 100 mL IVPB     1 g 200 mL/hr over 30 Minutes Intravenous Every 24 hours 07/25/19 0734 07/29/19 1518       Subjective: Not verbally responding to any questions asked, looks comfortable    Objective: Vitals:   08/10/19 0402 08/10/19 0538 08/10/19 0819 08/10/19 1521  BP:  109/77 125/86 (!) 143/92  Pulse:   75 79  Resp: '15 16  16  ' Temp:  (!) 97.4 F (36.3 C) (!) 97.5 F (36.4 C) 97.8 F (36.6 C)  TempSrc:  Oral Oral   SpO2:   100% 100%  Weight: 64.4 kg     Height:        Intake/Output Summary (Last 24 hours) at 08/10/2019 1645 Last data filed at  08/10/2019 1600 Gross per 24 hour  Intake 702.46 ml  Output 750 ml  Net -47.54 ml   Filed Weights   08/09/19 0509 08/09/19 1202 08/10/19 0402  Weight: 62.2 kg 62.4 kg 64.4 kg    Examination:  General: NAD, not conversant  Cardiovascular: S1, S2 present  Respiratory: CTAB  Abdomen: Soft, nontender, nondistended, bowel sounds present  Musculoskeletal: No bilateral pedal edema noted  Skin: Normal  Psychiatry:  Unable to assess    Data Reviewed: I have personally reviewed following labs and imaging studies  CBC: Recent Labs  Lab 08/04/19 0320 08/06/19 0401 08/08/19 0405 08/09/19 0423  WBC 8.8 8.6 6.7 6.8  NEUTROABS 5.5 5.5 3.9 4.0  HGB 9.8* 10.3* 10.5* 11.3*  HCT 31.3* 32.6* 33.2* 35.1*  MCV 94.0 95.0 96.8 95.9  PLT 231 299 308 539   Basic Metabolic Panel: Recent Labs  Lab 08/04/19 0320 08/05/19 0413 08/06/19 0401 08/08/19 0405 08/09/19 0423 08/10/19 0352  NA 141 142 141 140 140 140  K 3.2* 3.3* 3.5 3.5 3.5 3.7  CL 110 108 109 108 104 104  CO2 '23 24 24 24 25 26  ' GLUCOSE 85 88 89 86 95 96  BUN 29* 24* 23* 26* 26* 34*  CREATININE 1.55* 1.45* 1.48* 1.49* 1.44* 1.80*  CALCIUM 8.6* 8.9 8.8* 9.0 9.1 9.3  MG 1.7 1.6* 1.8 1.7  --   --    GFR: Estimated Creatinine Clearance: 44.7 mL/min (A) (by C-G formula based on SCr of 1.8 mg/dL (H)). Liver Function Tests: Recent Labs  Lab 08/04/19 0320 08/08/19 0405 08/09/19 0423 08/10/19 0352  AST 85* 128* 159* 174*  ALT 79* 107* 136* 150*  ALKPHOS 72 73 81 81  BILITOT 2.3* 1.6* 1.2 1.0  PROT 6.5 7.0 7.7 7.0  ALBUMIN 2.6* 2.7* 3.1* 2.9*   No results for input(s): LIPASE, AMYLASE in the last 168 hours. No results for input(s): AMMONIA in the last 168 hours. Coagulation Profile: No results for input(s): INR, PROTIME in the last 168 hours. Cardiac Enzymes: No results for input(s): CKTOTAL, CKMB, CKMBINDEX, TROPONINI in the last 168 hours. BNP (last 3 results) No results for input(s): PROBNP in the last 8760  hours. HbA1C: No results for input(s): HGBA1C in the last 72 hours. CBG: Recent Labs  Lab 08/09/19 1624 08/09/19 2302 08/10/19 0805 08/10/19 1224 08/10/19 1635  GLUCAP 114* 106* 87 137* 105*   Lipid Profile: No results for input(s): CHOL, HDL, LDLCALC, TRIG, CHOLHDL, LDLDIRECT in the last 72 hours. Thyroid Function Tests: No results for input(s): TSH, T4TOTAL, FREET4, T3FREE, THYROIDAB in the last 72 hours. Anemia Panel: No results for input(s): VITAMINB12, FOLATE, FERRITIN, TIBC, IRON, RETICCTPCT in the last 72 hours. Sepsis Labs: No results for input(s): PROCALCITON, LATICACIDVEN in the last 168 hours.  No results found for this or any previous visit (from the past 240 hour(s)).       Radiology Studies: No results found.      Scheduled Meds: .  stroke: mapping our early stages of recovery book   Does not apply Once  . amLODipine  10 mg Oral Daily  . aspirin  300 mg Rectal Daily   Or  . aspirin  325 mg Oral Daily  . chlorhexidine gluconate (MEDLINE KIT)  15 mL Mouth Rinse  BID  . [START ON 08/11/2019] Chlorhexidine Gluconate Cloth  6 each Topical Q0600  . feeding supplement (ENSURE ENLIVE)  237 mL Oral TID BM  . folic acid  1 mg Oral Daily  . heparin  5,000 Units Subcutaneous Q8H  . insulin aspart  0-15 Units Subcutaneous TID WC  . insulin aspart  0-5 Units Subcutaneous QHS  . lactulose  20 g Oral BID   Or  . lactulose  300 mL Rectal BID  . mouth rinse  15 mL Mouth Rinse BID  . metoprolol tartrate  25 mg Oral BID  . multivitamin with minerals  1 tablet Oral Daily  . thiamine  100 mg Oral Daily   Or  . thiamine  100 mg Intravenous Daily   Continuous Infusions: . sodium chloride 100 mL/hr at 08/10/19 1600     LOS: 18 days        Alma Friendly, MD [facility] 08/10/2019, 4:45 PM

## 2019-08-11 LAB — GLUCOSE, CAPILLARY
Glucose-Capillary: 76 mg/dL (ref 70–99)
Glucose-Capillary: 101 mg/dL — ABNORMAL HIGH (ref 70–99)
Glucose-Capillary: 88 mg/dL (ref 70–99)
Glucose-Capillary: 81 mg/dL (ref 70–99)

## 2019-08-11 LAB — COMPREHENSIVE METABOLIC PANEL
ALT: 157 U/L — ABNORMAL HIGH (ref 0–44)
AST: 170 U/L — ABNORMAL HIGH (ref 15–41)
Albumin: 2.7 g/dL — ABNORMAL LOW (ref 3.5–5.0)
Alkaline Phosphatase: 75 U/L (ref 38–126)
Anion gap: 9 (ref 5–15)
BUN: 24 mg/dL — ABNORMAL HIGH (ref 6–20)
CO2: 24 mmol/L (ref 22–32)
Calcium: 8.7 mg/dL — ABNORMAL LOW (ref 8.9–10.3)
Chloride: 107 mmol/L (ref 98–111)
Creatinine, Ser: 1.33 mg/dL — ABNORMAL HIGH (ref 0.61–1.24)
GFR calc Af Amer: >60 mL/min (ref >60)
GFR calc non Af Amer: >60 mL/min (ref >60)
Glucose, Bld: 91 mg/dL (ref 70–99)
Potassium: 3.7 mmol/L (ref 3.5–5.1)
Sodium: 140 mmol/L (ref 135–145)
Total Bilirubin: 1 mg/dL (ref 0.3–1.2)
Total Protein: 6.9 g/dL (ref 6.5–8.1)

## 2019-08-11 NOTE — Progress Notes (Signed)
Patient ID: Tyler [name], male   DOB: 1971/08/13, 48 y.o.   MRN: [id number]  PROGRESS NOTE    Giorgio Chabot  PPJ:[id number] DOB: Mar 24, 1971 DOA: 07/23/2019 PCP: Patient, No Pcp Per   Brief Narrative:  48 year old male with history of polysubstance abuse including heroin presented to [facility] ER after being found down with a syringe nearby.  He was given Narcan with minimal improvement.  Found in severe rhabdomyolysis with acute kidney injury and hyperkalemia.  He was started on IV fluids including bicarb drip and transferred to [facility] for further care.  Rhabdomyolysis improved but he had persistence of encephalopathy and was found to have acute bilateral cerebellar infarct.  Neurology was consulted.  Neurology has signed off.  PT recommends SNF placement.  Assessment & Plan: Acute metabolic encephalopathy: Multifactorial Still ongoing, not verbally responding to questions asked Most likely secondary to drug use, uremia, heavy alcohol use CT of the head and neck was initially negative for any traumatic finding UDS, Tylenol and salicylate levels were negative. TSH, folate, B12 levels all within normal limits EEG: Generalized diffuse slowing but no seizure activity Fall precautions  Acute bilateral CVA, nonhemorrhagic MRI of the brain showed acute CVA Neurology evaluated the patient and has signed off.  MRA of the head negative.  Ultrasound carotid showed bilateral 1 to 39% ICA stenosis Neurology recommends considering 30-day cardiac event monitoring as outpatient to rule out A. fib if patient neurologically improves LDL 83; A1c 5.1. Continue aspirin, will start statin once LFTs normalized. Echocardiogram: EF of 60 to 65% Lower extremity Dopplers: Negative for DVT Follow PT/OT/SLP recommendations.  PT recommends SNF.  Severe rhabdomyolysis/AKI Improving, creatinine peaked to 5.79 Renal ultrasound negative Daily CMP  Transaminitis with elevated bilirubin Ongoing Right upper  quadrant ultrasound showed hepatic steatosis with early cirrhosis with some gallbladder thickening Daily CMP  Alcohol abuse Advised to quit As per CIWA protocol Continue thiamine, multivitamin and folic acid.  Urinary tract infection Present on admission Completed 5 days of Rocephin  Essential hypertension Stable Continue amlodipine and metoprolol  Severe malnutrition Poor oral intake Dietitian on board, may consider initiation of nutrition support via tube feeds   DVT prophylaxis: Heparin Code Status: Full Family Communication: None at bedside Disposition Plan: SNF, difficult placement  Consultants: Neurology  Procedures:  Echo IMPRESSIONS    1. The left ventricle has normal systolic function with an ejection fraction of 60-65%. The cavity size was normal. Left ventricular diastolic Doppler parameters are consistent with impaired relaxation. No evidence of left ventricular regional wall  motion abnormalities.  2. The right ventricle has normal systolic function. The cavity was normal. There is no increase in right ventricular wall thickness. Right ventricular systolic pressure is normal with an estimated pressure of 21.0 mmHg.  3. The aortic valve is tricuspid. Moderate sclerosis of the aortic valve.  4. The aorta is normal unless otherwise noted.  Antimicrobials:  Anti-infectives (From admission, onward)   Start     Dose/Rate Route Frequency Ordered Stop   07/25/19 0745  cefTRIAXone (ROCEPHIN) 1 g in sodium chloride 0.9 % 100 mL IVPB     1 g 200 mL/hr over 30 Minutes Intravenous Every 24 hours 07/25/19 0734 07/29/19 1518       Subjective: Not verbally responding to questions asked, poor oral intake.  May require tube feeds    Objective: Vitals:   08/11/19 0500 08/11/19 0652 08/11/19 1146 08/11/19 1232  BP:  (!) 165/92 (!) 136/94 (!) 155/99  Pulse:  67 87 76  Resp:  20  14  Temp:  (!) 97.5 F (36.4 C)  97.7 F (36.5 C)  TempSrc:    Oral  SpO2:  100%   100%  Weight: 62.9 kg     Height:        Intake/Output Summary (Last 24 hours) at 08/11/2019 1249 Last data filed at 08/10/2019 1600 Gross per 24 hour  Intake 702.46 ml  Output -  Net 702.46 ml   Filed Weights   08/09/19 1202 08/10/19 0402 08/11/19 0500  Weight: 62.4 kg 64.4 kg 62.9 kg    Examination:   General: NAD, not conversant, cachectic  Cardiovascular: S1, S2 present  Respiratory: CTAB  Abdomen: Soft, nontender, nondistended, bowel sounds present  Musculoskeletal: No bilateral pedal edema noted  Skin: Normal  Psychiatry:  Unable to assess     Data Reviewed: I have personally reviewed following labs and imaging studies  CBC: Recent Labs  Lab 08/06/19 0401 08/08/19 0405 08/09/19 0423  WBC 8.6 6.7 6.8  NEUTROABS 5.5 3.9 4.0  HGB 10.3* 10.5* 11.3*  HCT 32.6* 33.2* 35.1*  MCV 95.0 96.8 95.9  PLT 299 308 343   Basic Metabolic Panel: Recent Labs  Lab 08/05/19 0413 08/06/19 0401 08/08/19 0405 08/09/19 0423 08/10/19 0352 08/11/19 0325  NA 142 141 140 140 140 140  K 3.3* 3.5 3.5 3.5 3.7 3.7  CL 108 109 108 104 104 107  CO2 _0 GLUCOSE 88 89 86 95 96 91  BUN 24* 23* 26* 26* 34* 24*  CREATININE 1.45* 1.48* 1.49* 1.44* 1.80* 1.33*  CALCIUM 8.9 8.8* 9.0 9.1 9.3 8.7*  MG 1.6* 1.8 1.7  --   --   --    GFR: Estimated Creatinine Clearance: 59.1 mL/min (A) (by C-G formula based on SCr of 1.33 mg/dL (H)). Liver Function Tests: Recent Labs  Lab 08/08/19 0405 08/09/19 0423 08/10/19 0352 08/11/19 0325  AST 128* 159* 174* 170*  ALT 107* 136* 150* 157*  ALKPHOS 73 81 81 75  BILITOT 1.6* 1.2 1.0 1.0  PROT 7.0 7.7 7.0 6.9  ALBUMIN 2.7* 3.1* 2.9* 2.7*   No results for input(s): LIPASE, AMYLASE in the last 168 hours. No results for input(s): AMMONIA in the last 168 hours. Coagulation Profile: No results for input(s): INR, PROTIME in the last 168 hours. Cardiac Enzymes: No results for input(s): CKTOTAL, CKMB, CKMBINDEX, TROPONINI in the  last 168 hours. BNP (last 3 results) No results for input(s): PROBNP in the last 8760 hours. HbA1C: No results for input(s): HGBA1C in the last 72 hours. CBG: Recent Labs  Lab 08/10/19 1224 08/10/19 1635 08/10/19 2107 08/11/19 0816 08/11/19 1231  GLUCAP 137* 105* 81 76 101*   Lipid Profile: No results for input(s): CHOL, HDL, LDLCALC, TRIG, CHOLHDL, LDLDIRECT in the last 72 hours. Thyroid Function Tests: No results for input(s): TSH, T4TOTAL, FREET4, T3FREE, THYROIDAB in the last 72 hours. Anemia Panel: No results for input(s): VITAMINB12, FOLATE, FERRITIN, TIBC, IRON, RETICCTPCT in the last 72 hours. Sepsis Labs: No results for input(s): PROCALCITON, LATICACIDVEN in the last 168 hours.  No results found for this or any previous visit (from the past 240 hour(s)).       Radiology Studies: No results found.      Scheduled Meds: .  stroke: mapping our early stages of recovery book   Does not apply Once  . amLODipine  10 mg Oral Daily  . aspirin  300 mg Rectal Daily   Or  .  aspirin  325 mg Oral Daily  . chlorhexidine gluconate (MEDLINE KIT)  15 mL Mouth Rinse BID  . Chlorhexidine Gluconate Cloth  6 each Topical Q0600  . feeding supplement (ENSURE ENLIVE)  237 mL Oral TID BM  . folic acid  1 mg Oral Daily  . heparin  5,000 Units Subcutaneous Q8H  . insulin aspart  0-15 Units Subcutaneous TID WC  . insulin aspart  0-5 Units Subcutaneous QHS  . lactulose  20 g Oral BID   Or  . lactulose  300 mL Rectal BID  . mouth rinse  15 mL Mouth Rinse BID  . metoprolol tartrate  25 mg Oral BID  . multivitamin with minerals  1 tablet Oral Daily  . thiamine  100 mg Oral Daily   Or  . thiamine  100 mg Intravenous Daily   Continuous Infusions:    LOS: 19 days        Alma Friendly, MD [facility] 08/11/2019, 12:49 PM

## 2019-08-12 LAB — COMPREHENSIVE METABOLIC PANEL
ALT: 171 U/L — ABNORMAL HIGH (ref 0–44)
AST: 173 U/L — ABNORMAL HIGH (ref 15–41)
Albumin: 3.1 g/dL — ABNORMAL LOW (ref 3.5–5.0)
Alkaline Phosphatase: 83 U/L (ref 38–126)
Anion gap: 9 (ref 5–15)
BUN: 19 mg/dL (ref 6–20)
CO2: 25 mmol/L (ref 22–32)
Calcium: 9.1 mg/dL (ref 8.9–10.3)
Chloride: 104 mmol/L (ref 98–111)
Creatinine, Ser: 1.27 mg/dL — ABNORMAL HIGH (ref 0.61–1.24)
GFR calc Af Amer: >60 mL/min (ref >60)
GFR calc non Af Amer: >60 mL/min (ref >60)
Glucose, Bld: 82 mg/dL (ref 70–99)
Potassium: 3.7 mmol/L (ref 3.5–5.1)
Sodium: 138 mmol/L (ref 135–145)
Total Bilirubin: 0.9 mg/dL (ref 0.3–1.2)
Total Protein: 7.3 g/dL (ref 6.5–8.1)

## 2019-08-12 LAB — GLUCOSE, CAPILLARY
Glucose-Capillary: 77 mg/dL (ref 70–99)
Glucose-Capillary: 83 mg/dL (ref 70–99)
Glucose-Capillary: 87 mg/dL (ref 70–99)
Glucose-Capillary: 126 mg/dL — ABNORMAL HIGH (ref 70–99)

## 2019-08-12 MED ORDER — ENOXAPARIN SODIUM 40 MG/0.4ML ~~LOC~~ SOLN
40.0000 mg | SUBCUTANEOUS | Status: DC
  Administered 2019-08-14 – 2019-11-27 (×88): 40 mg via SUBCUTANEOUS
  Filled 2019-08-12 (×90): qty 0.4

## 2019-08-12 NOTE — Progress Notes (Signed)
Pt unwilling to take medications at this time. Will retry,

## 2019-08-12 NOTE — Progress Notes (Signed)
Pt agreed to take meds. Did report nausea - given Zofran first. Updated pt that daughter called. Pt minimally interactive but looking around, answered questions with yes, no, sure, whatever.

## 2019-08-12 NOTE — Progress Notes (Signed)
Patient ID: Woodfin Kiss, male   DOB: 1971/09/30, 48 y.o.   MRN: [id number]  PROGRESS NOTE    Israel Werts  [id number]:[id number] DOB: 1971/04/27 DOA: 07/23/2019 PCP: Patient, No Pcp Per   Brief Narrative:  48 year old male with history of polysubstance abuse including heroin presented to [facility] ER after being found down with a syringe nearby.  He was given Narcan with minimal improvement.  Found in severe rhabdomyolysis with acute kidney injury and hyperkalemia.  He was started on IV fluids including bicarb drip and transferred to [facility] for further care.  Rhabdomyolysis improved but he had persistence of encephalopathy and was found to have acute bilateral cerebellar infarct.  Neurology was consulted.  Neurology has signed off.  PT recommends SNF placement.  Assessment & Plan: Acute metabolic encephalopathy: Multifactorial Still ongoing, not verbally responding to questions asked, sometimes refusing wound care, meals Most likely secondary to drug use, uremia, heavy alcohol use CT of the head and neck was initially negative for any traumatic finding UDS, Tylenol and salicylate levels were negative. TSH, folate, B12 levels all within normal limits EEG: Generalized diffuse slowing but no seizure activity Fall precautions  Acute bilateral CVA, nonhemorrhagic MRI of the brain showed acute CVA Neurology evaluated the patient and has signed off.  MRA of the head negative.  Ultrasound carotid showed bilateral 1 to 39% ICA stenosis Neurology recommends considering 30-day cardiac event monitoring as outpatient to rule out A. fib if patient neurologically improves LDL 83; A1c 5.1. Continue aspirin, will start statin once LFTs normalized. Echocardiogram: EF of 60 to 65% Lower extremity Dopplers: Negative for DVT Follow PT/OT/SLP recommendations.  PT recommends SNF.  Severe rhabdomyolysis/AKI Improving, creatinine peaked to 5.79 Renal ultrasound negative Daily CMP  Transaminitis with elevated  bilirubin Ongoing Right upper quadrant ultrasound showed hepatic steatosis with early cirrhosis with some gallbladder thickening Daily CMP  Alcohol abuse Advised to quit As per CIWA protocol Continue thiamine, multivitamin and folic acid.  Urinary tract infection Present on admission Completed 5 days of Rocephin  Essential hypertension Stable Continue amlodipine and metoprolol  Severe malnutrition Poor oral intake Dietitian on board, may consider initiation of nutrition support via tube feeds   DVT prophylaxis: Lovenox Code Status: Full Family Communication: None at bedside Disposition Plan: SNF, difficult placement  Consultants: Neurology  Procedures:  Echo IMPRESSIONS    1. The left ventricle has normal systolic function with an ejection fraction of 60-65%. The cavity size was normal. Left ventricular diastolic Doppler parameters are consistent with impaired relaxation. No evidence of left ventricular regional wall  motion abnormalities.  2. The right ventricle has normal systolic function. The cavity was normal. There is no increase in right ventricular wall thickness. Right ventricular systolic pressure is normal with an estimated pressure of 21.0 mmHg.  3. The aortic valve is tricuspid. Moderate sclerosis of the aortic valve.  4. The aorta is normal unless otherwise noted.  Antimicrobials:  Anti-infectives (From admission, onward)   Start     Dose/Rate Route Frequency Ordered Stop   07/25/19 0745  cefTRIAXone (ROCEPHIN) 1 g in sodium chloride 0.9 % 100 mL IVPB     1 g 200 mL/hr over 30 Minutes Intravenous Every 24 hours 07/25/19 0734 07/29/19 1518       Subjective: Not verbally responding to questions, noted to refuse wound care occasionally as well as food    Objective: Vitals:   08/11/19 2200 08/12/19 0105 08/12/19 0435 08/12/19 0901  BP: (!) 169/108 (!) 141/96 (!) 165/94 (!) 153/100  Pulse:  65 62 60  Resp: '11 18 19 14  ' Temp:   97.9 F (36.6 C)  (!) 97 F (36.1 C)  TempSrc:      SpO2: 100% 100% 100% 100%  Weight:   63.1 kg   Height:        Intake/Output Summary (Last 24 hours) at 08/12/2019 1236 Last data filed at 08/11/2019 2220 Gross per 24 hour  Intake 240 ml  Output 1075 ml  Net -835 ml   Filed Weights   08/10/19 0402 08/11/19 0500 08/12/19 0435  Weight: 64.4 kg 62.9 kg 63.1 kg    Examination:   General: NAD, not conversant, cachectic  Cardiovascular: S1, S2 present  Respiratory: CTAB  Abdomen: Soft, nontender, nondistended, bowel sounds present  Musculoskeletal: No bilateral pedal edema noted  Skin: Normal  Psychiatry:  Unable to assess     Data Reviewed: I have personally reviewed following labs and imaging studies  CBC: Recent Labs  Lab 08/06/19 0401 08/08/19 0405 08/09/19 0423  WBC 8.6 6.7 6.8  NEUTROABS 5.5 3.9 4.0  HGB 10.3* 10.5* 11.3*  HCT 32.6* 33.2* 35.1*  MCV 95.0 96.8 95.9  PLT 299 308 076   Basic Metabolic Panel: Recent Labs  Lab 08/06/19 0401 08/08/19 0405 08/09/19 0423 08/10/19 0352 08/11/19 0325 08/12/19 0428  NA 141 140 140 140 140 138  K 3.5 3.5 3.5 3.7 3.7 3.7  CL 109 108 104 104 107 104  CO2 '24 24 25 26 24 25  ' GLUCOSE 89 86 95 96 91 82  BUN 23* 26* 26* 34* 24* 19  CREATININE 1.48* 1.49* 1.44* 1.80* 1.33* 1.27*  CALCIUM 8.8* 9.0 9.1 9.3 8.7* 9.1  MG 1.8 1.7  --   --   --   --    GFR: Estimated Creatinine Clearance: 62.1 mL/min (A) (by C-G formula based on SCr of 1.27 mg/dL (H)). Liver Function Tests: Recent Labs  Lab 08/08/19 0405 08/09/19 0423 08/10/19 0352 08/11/19 0325 08/12/19 0428  AST 128* 159* 174* 170* 173*  ALT 107* 136* 150* 157* 171*  ALKPHOS 73 81 81 75 83  BILITOT 1.6* 1.2 1.0 1.0 0.9  PROT 7.0 7.7 7.0 6.9 7.3  ALBUMIN 2.7* 3.1* 2.9* 2.7* 3.1*   No results for input(s): LIPASE, AMYLASE in the last 168 hours. No results for input(s): AMMONIA in the last 168 hours. Coagulation Profile: No results for input(s): INR, PROTIME in the last  168 hours. Cardiac Enzymes: No results for input(s): CKTOTAL, CKMB, CKMBINDEX, TROPONINI in the last 168 hours. BNP (last 3 results) No results for input(s): PROBNP in the last 8760 hours. HbA1C: No results for input(s): HGBA1C in the last 72 hours. CBG: Recent Labs  Lab 08/11/19 1231 08/11/19 1630 08/11/19 2126 08/12/19 0859 08/12/19 1159  GLUCAP 101* 88 81 77 83   Lipid Profile: No results for input(s): CHOL, HDL, LDLCALC, TRIG, CHOLHDL, LDLDIRECT in the last 72 hours. Thyroid Function Tests: No results for input(s): TSH, T4TOTAL, FREET4, T3FREE, THYROIDAB in the last 72 hours. Anemia Panel: No results for input(s): VITAMINB12, FOLATE, FERRITIN, TIBC, IRON, RETICCTPCT in the last 72 hours. Sepsis Labs: No results for input(s): PROCALCITON, LATICACIDVEN in the last 168 hours.  No results found for this or any previous visit (from the past 240 hour(s)).       Radiology Studies: No results found.      Scheduled Meds:   stroke: mapping our early stages of recovery book   Does not apply Once   amLODipine  10 mg Oral Daily   aspirin  300 mg Rectal Daily   Or   aspirin  325 mg Oral Daily   chlorhexidine gluconate (MEDLINE KIT)  15 mL Mouth Rinse BID   Chlorhexidine Gluconate Cloth  6 each Topical Q0600   feeding supplement (ENSURE ENLIVE)  237 mL Oral TID BM   folic acid  1 mg Oral Daily   heparin  5,000 Units Subcutaneous Q8H   insulin aspart  0-15 Units Subcutaneous TID WC   insulin aspart  0-5 Units Subcutaneous QHS   lactulose  20 g Oral BID   Or   lactulose  300 mL Rectal BID   mouth rinse  15 mL Mouth Rinse BID   metoprolol tartrate  25 mg Oral BID   multivitamin with minerals  1 tablet Oral Daily   thiamine  100 mg Oral Daily   Or   thiamine  100 mg Intravenous Daily   Continuous Infusions:    LOS: 20 days        Alma Friendly, MD [facility] 08/12/2019, 12:36 PM

## 2019-08-12 NOTE — Progress Notes (Signed)
Pt's DL PICC line ports can not be flushed.  Primary RN was made aware and was advised to call pt's MD if PICC line can be discontinued.

## 2019-08-13 LAB — GLUCOSE, CAPILLARY
Glucose-Capillary: 96 mg/dL (ref 70–99)
Glucose-Capillary: 86 mg/dL (ref 70–99)
Glucose-Capillary: 160 mg/dL — ABNORMAL HIGH (ref 70–99)
Glucose-Capillary: 79 mg/dL (ref 70–99)

## 2019-08-13 NOTE — Progress Notes (Signed)
Patient ID: Tyler [name], male   DOB: 08/15/1971, 48 y.o.   MRN: [id number]  PROGRESS NOTE    Tyler [name]  ERX:[id number] DOB: May 12, 1971 DOA: 07/23/2019 PCP: Patient, No Pcp Per   Brief Narrative:  48 year old male with history of polysubstance abuse including heroin presented to [facility] ER after being found down with a syringe nearby.  He was given Narcan with minimal improvement.  Found in severe rhabdomyolysis with acute kidney injury and hyperkalemia.  He was started on IV fluids including bicarb drip and transferred to [facility] for further care.  Rhabdomyolysis improved but he had persistence of encephalopathy and was found to have acute bilateral cerebellar infarct.  Neurology was consulted.  Neurology has signed off.  PT recommends SNF placement.  Assessment & Plan: Acute metabolic encephalopathy: Multifactorial Still ongoing, not verbally responding to questions asked, sometimes refusing wound care, meals Most likely secondary to drug use, uremia, heavy alcohol use CT of the head and neck was initially negative for any traumatic finding UDS, Tylenol and salicylate levels were negative. TSH, folate, B12 levels all within normal limits EEG: Generalized diffuse slowing but no seizure activity Fall precautions  Acute bilateral CVA, nonhemorrhagic MRI of the brain showed acute CVA Neurology evaluated the patient and has signed off.  MRA of the head negative.  Ultrasound carotid showed bilateral 1 to 39% ICA stenosis Neurology recommends considering 30-day cardiac event monitoring as outpatient to rule out A. fib if patient neurologically improves LDL 83; A1c 5.1. Continue aspirin, will start statin once LFTs normalized. Echocardiogram: EF of 60 to 65% Lower extremity Dopplers: Negative for DVT Follow PT/OT/SLP recommendations.  PT recommends SNF.  Severe rhabdomyolysis/AKI Improving, creatinine peaked to 5.79 Renal ultrasound negative Daily CMP  Transaminitis with elevated  bilirubin Improving Right upper quadrant ultrasound showed hepatic steatosis with early cirrhosis with some gallbladder thickening Daily CMP  Alcohol abuse Advised to quit As per CIWA protocol Continue thiamine, multivitamin and folic acid.  Urinary tract infection Present on admission Completed 5 days of Rocephin  Essential hypertension Stable Continue amlodipine and metoprolol  Severe malnutrition Poor oral intake Dietitian on board, may consider initiation of nutrition support via tube feeds   DVT prophylaxis: Lovenox Code Status: Full Family Communication: None at bedside Disposition Plan: SNF, difficult placement  Consultants: Neurology  Procedures:  Echo IMPRESSIONS    1. The left ventricle has normal systolic function with an ejection fraction of 60-65%. The cavity size was normal. Left ventricular diastolic Doppler parameters are consistent with impaired relaxation. No evidence of left ventricular regional wall  motion abnormalities.  2. The right ventricle has normal systolic function. The cavity was normal. There is no increase in right ventricular wall thickness. Right ventricular systolic pressure is normal with an estimated pressure of 21.0 mmHg.  3. The aortic valve is tricuspid. Moderate sclerosis of the aortic valve.  4. The aorta is normal unless otherwise noted.  Antimicrobials:  Anti-infectives (From admission, onward)   Start     Dose/Rate Route Frequency Ordered Stop   07/25/19 0745  cefTRIAXone (ROCEPHIN) 1 g in sodium chloride 0.9 % 100 mL IVPB     1 g 200 mL/hr over 30 Minutes Intravenous Every 24 hours 07/25/19 0734 07/29/19 1518       Subjective: Patient seen and examined at bedside, physical therapist Earnest Bailey was present.  Assisted in placing patient in an upright position, of which patient quickly went back to lying down.  Looked comfortable    Objective: Vitals:   08/13/19  0200 08/13/19 0218 08/13/19 0500 08/13/19 0501  BP:  95/68   105/80  Pulse:  69  67  Resp: 14   16  Temp:    (!) 97.4 F (36.3 C)  TempSrc:    Oral  SpO2:    100%  Weight:   61.9 kg   Height:        Intake/Output Summary (Last 24 hours) at 08/13/2019 1233 Last data filed at 08/12/2019 2310 Gross per 24 hour  Intake 510 ml  Output 700 ml  Net -190 ml   Filed Weights   08/11/19 0500 08/12/19 0435 08/13/19 0500  Weight: 62.9 kg 63.1 kg 61.9 kg    Examination:   General: NAD, cachectic  Cardiovascular: S1, S2 present  Respiratory: CTAB  Abdomen: Soft, nontender, nondistended, bowel sounds present  Musculoskeletal: No bilateral pedal edema noted  Skin: Normal  Psychiatry: Unable to assess     Data Reviewed: I have personally reviewed following labs and imaging studies  CBC: Recent Labs  Lab 08/08/19 0405 08/09/19 0423  WBC 6.7 6.8  NEUTROABS 3.9 4.0  HGB 10.5* 11.3*  HCT 33.2* 35.1*  MCV 96.8 95.9  PLT 308 498   Basic Metabolic Panel: Recent Labs  Lab 08/08/19 0405 08/09/19 0423 08/10/19 0352 08/11/19 0325 08/12/19 0428  NA 140 140 140 140 138  K 3.5 3.5 3.7 3.7 3.7  CL 108 104 104 107 104  CO2 _0 GLUCOSE 86 95 96 91 82  BUN 26* 26* 34* 24* 19  CREATININE 1.49* 1.44* 1.80* 1.33* 1.27*  CALCIUM 9.0 9.1 9.3 8.7* 9.1  MG 1.7  --   --   --   --    GFR: Estimated Creatinine Clearance: 60.9 mL/min (A) (by C-G formula based on SCr of 1.27 mg/dL (H)). Liver Function Tests: Recent Labs  Lab 08/08/19 0405 08/09/19 0423 08/10/19 0352 08/11/19 0325 08/12/19 0428  AST 128* 159* 174* 170* 173*  ALT 107* 136* 150* 157* 171*  ALKPHOS 73 81 81 75 83  BILITOT 1.6* 1.2 1.0 1.0 0.9  PROT 7.0 7.7 7.0 6.9 7.3  ALBUMIN 2.7* 3.1* 2.9* 2.7* 3.1*   No results for input(s): LIPASE, AMYLASE in the last 168 hours. No results for input(s): AMMONIA in the last 168 hours. Coagulation Profile: No results for input(s): INR, PROTIME in the last 168 hours. Cardiac Enzymes: No results for input(s): CKTOTAL, CKMB,  CKMBINDEX, TROPONINI in the last 168 hours. BNP (last 3 results) No results for input(s): PROBNP in the last 8760 hours. HbA1C: No results for input(s): HGBA1C in the last 72 hours. CBG: Recent Labs  Lab 08/12/19 1159 08/12/19 1835 08/12/19 2134 08/13/19 0800 08/13/19 1141  GLUCAP 83 87 126* 96 86   Lipid Profile: No results for input(s): CHOL, HDL, LDLCALC, TRIG, CHOLHDL, LDLDIRECT in the last 72 hours. Thyroid Function Tests: No results for input(s): TSH, T4TOTAL, FREET4, T3FREE, THYROIDAB in the last 72 hours. Anemia Panel: No results for input(s): VITAMINB12, FOLATE, FERRITIN, TIBC, IRON, RETICCTPCT in the last 72 hours. Sepsis Labs: No results for input(s): PROCALCITON, LATICACIDVEN in the last 168 hours.  No results found for this or any previous visit (from the past 240 hour(s)).       Radiology Studies: No results found.      Scheduled Meds:   stroke: mapping our early stages of recovery book   Does not apply Once   amLODipine  10 mg Oral Daily   aspirin  300 mg Rectal  Daily   Or   aspirin  325 mg Oral Daily   chlorhexidine gluconate (MEDLINE KIT)  15 mL Mouth Rinse BID   Chlorhexidine Gluconate Cloth  6 each Topical Q0600   enoxaparin (LOVENOX) injection  40 mg Subcutaneous Q24H   feeding supplement (ENSURE ENLIVE)  237 mL Oral TID BM   folic acid  1 mg Oral Daily   insulin aspart  0-15 Units Subcutaneous TID WC   insulin aspart  0-5 Units Subcutaneous QHS   lactulose  20 g Oral BID   Or   lactulose  300 mL Rectal BID   mouth rinse  15 mL Mouth Rinse BID   metoprolol tartrate  25 mg Oral BID   multivitamin with minerals  1 tablet Oral Daily   thiamine  100 mg Oral Daily   Or   thiamine  100 mg Intravenous Daily   Continuous Infusions:    LOS: 21 days        Alma Friendly, MD [facility] 08/13/2019, 12:33 PM

## 2019-08-13 NOTE — Progress Notes (Signed)
Spoke with Conservator, museum/gallery. Patient not following directions to allow this RN to change the PICC dressing. Suggested if PICC is no longer needed, ask MD for d/c order.

## 2019-08-13 NOTE — Progress Notes (Signed)
Physical Therapy Treatment Patient Details Name: Tyler [name] MRN: [id number] DOB: 07/02/1971 Today's Date: 08/13/2019    History of Present Illness Pt is a 48 y/o male transferred from [facility] for unresponsiveness, likely secondary to substance abuse. Pt with rhabdomyolysis and AKI. EEG revealed moderate diffuse encephalopathy. PMH includes polysubstance abuse and HTN.     PT Comments    Continuing work on functional mobility and activity tolerance;  Less participatory today; initiated going prone to sidelying in prep for getting up; this PT told Tyler [name] the goal to get OOB to eat breakfast (was just delivered), and he agreed with "a-huh"; However, once movement was initiated to sit up EOB, he resisted, heavily leaning L to lay back down; Dr. Sharolyn DouglasEzenduka in room and assisted with sidelying to sit  Follow Up Recommendations  SNF;Supervision for mobility/OOB     Equipment Recommendations  Rolling walker with 5" wheels;3in1 (PT)    Recommendations for Other Services       Precautions / Restrictions Precautions Precautions: Fall Precaution Comments: poor safety awareness    Mobility  Bed Mobility Overal bed mobility: Needs Assistance Bed Mobility: Rolling;Supine to Sit;Sit to Sidelying Rolling: Mod assist   Supine to sit: Mod assist;Max assist   Sit to sidelying: Min assist General bed mobility comments: When asked about getting up to eat breakfast, he states "a huh", but then does not initiate; assist to clear LEs from EOB, and then Mod max assist of 2 to elevate trunk to sit; tending to consistently pull to L to lay back down once sitting up; min assist to reposition back to laying down  Transfers                 General transfer comment: Unable due to heavy pull to L to lay back down while sitting EOB  Ambulation/Gait                 Stairs             Wheelchair Mobility    Modified Rankin (Stroke Patients Only)       Balance      Sitting balance-Leahy Scale: Poor Sitting balance - Comments: pulling to L to lay back down Postural control: Left lateral lean(resisting)                                  Cognition Arousal/Alertness: (Sleepy, arousable to name, and eyes more open sitting up) Behavior During Therapy: Flat affect Overall Cognitive Status: Impaired/Different from baseline Area of Impairment: Orientation;Attention;Following commands;Awareness                       Following Commands: Follows one step commands inconsistently     Problem Solving: Slow processing;Decreased initiation;Requires verbal cues;Requires tactile cues General Comments: Able to tell me his date of birth; all other questions answered with "OK, a-huh, it's all good"; but then does not initiate to follow cues for mobiltiy      Exercises      General Comments General comments (skin integrity, edema, etc.): Observed pt moving well in bed, including rolling to prone, before session      Pertinent Vitals/Pain Pain Assessment: Faces Faces Pain Scale: No hurt Pain Location: no visual indication of pain, not respoding when directly asked about pain    Home Living  Prior Function            PT Goals (current goals can now be found in the care plan section) Acute Rehab PT Goals Patient Stated Goal: none stated PT Goal Formulation: Patient unable to participate in goal setting Time For Goal Achievement: 08/09/19 Potential to Achieve Goals: Fair Progress towards PT goals: Not progressing toward goals - comment(limited ability to participate)    Frequency    Min 2X/week      PT Plan Current plan remains appropriate    Co-evaluation              AM-PAC PT "6 Clicks" Mobility   Outcome Measure  Help needed turning from your back to your side while in a flat bed without using bedrails?: None Help needed moving from lying on your back to sitting on the side of a  flat bed without using bedrails?: A Lot Help needed moving to and from a bed to a chair (including a wheelchair)?: A Lot Help needed standing up from a chair using your arms (e.g., wheelchair or bedside chair)?: Total Help needed to walk in hospital room?: Total Help needed climbing 3-5 steps with a railing? : Total 6 Click Score: 11    End of Session   Activity Tolerance: Patient tolerated treatment well;Other (comment)(Clearly pulling ot lay back down) Patient left: in bed;with call bell/phone within reach;with bed alarm set Nurse Communication: Mobility status PT Visit Diagnosis: Other abnormalities of gait and mobility (R26.89);Difficulty in walking, not elsewhere classified (R26.2)     Time: 1517-6160 PT Time Calculation (min) (ACUTE ONLY): 12 min  Charges:  $Therapeutic Activity: 8-22 mins                     Roney Marion, PT  Acute Rehabilitation Services Pager [phone] Office [place] 08/13/2019, 9:05 AM

## 2019-08-14 LAB — GLUCOSE, CAPILLARY
Glucose-Capillary: 92 mg/dL (ref 70–99)
Glucose-Capillary: 108 mg/dL — ABNORMAL HIGH (ref 70–99)
Glucose-Capillary: 80 mg/dL (ref 70–99)
Glucose-Capillary: 87 mg/dL (ref 70–99)

## 2019-08-14 NOTE — Progress Notes (Signed)
  Speech Language Pathology Treatment: Cognitive-Linquistic  Patient Details Name: Tyler [name] MRN: [id number] DOB: [birth year] Today's Date: 08/14/2019 Time: 8916-9450 SLP Time Calculation (min) (ACUTE ONLY): 21 min  Assessment / Plan / Recommendation Clinical Impression  Pt was seen for co-treatment session with occupational therapy. He was alert throughout the session and was able to maintain alertness for a longer period than during the last session. He was re-positioned to the side of the bed by OT and set up was provided for oral care. Pt exhibited difficulty with verbal sequencing of the necessary steps to perform oral care and with accurate completion of the task. Mod-max cues were needed for thoroughness. Confrontational naming of objects was attempted but pt stated, "I don't known" to all presented items. Pt was re-oriented to place at the onset of the session but was unable to immediately recall the provided information despite choice cues. When presented with a simple reasoning task to determine his location, he consistently stated, "I don't know" or repeated the information provided to him. Pt required cues for functional problem solving as well as focused and sustained attention throughout the session. SLP will continue to follow pt.    HPI HPI: Pt is a 48 y.o. male with medical history significant of polysubstance abuse including heroin who was brought in to [facility] ED on 07/23/19 after being found down by friend with nearby syringe. He was transferred to [facility] for further care. MRI of the brain of 07/25/19 showed patchy acute bilateral cerebral and cerebellar infarcts. Right temporal lobe encephalomalacia and chronic cerebral white matter disease.       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 Hardin Negus, [place], [place] Office number [phone] Pager [place] 08/14/2019, 3:20 PM

## 2019-08-14 NOTE — Progress Notes (Signed)
Nutrition Follow-up  DOCUMENTATION CODES:   Severe malnutrition in context of acute illness/injury  INTERVENTION:   -Continue Ensure Enlive po TID, each supplement provides 350 kcal and 20 grams of protein -Continue MVI with minerals daily -Continue dysphagia 3 (advanced mechanical soft) for ease of intake -Continue Magic cup TID with meals, each supplement provides 290 kcal and 9 grams of protein -Continue Hormel shake TID with meals, each supplement provides 520 kcals and 22 grams protein  NUTRITION DIAGNOSIS:   Severe Malnutrition related to acute illness(acute toxic/metabolic encephalopathy) as evidenced by moderate fat depletion, severe fat depletion, moderate muscle depletion, severe muscle depletion, energy intake < or equal to 50% for > or equal to 5 days, percent weight loss.  Ongoing  GOAL:   Patient will meet greater than or equal to 90% of their needs  Progressing   MONITOR:   PO intake, Supplement acceptance, Labs, Weight trends, Skin, I & O's  REASON FOR ASSESSMENT:   LOS    ASSESSMENT:   Tyler [name] is a 48 y.o. male with medical history significant of polysubstance abuse including heroin.  Brought in to [facility] ED just before MN after being found down by friend last evening with nearby syringe.  Reviewed I/O's: +910 ml x 24 hours and -1.3 L since 07/31/19  UOP: 250 ml x 24 hours  Pt sleeping soundly at time of visit. He did not respond to voice. Pt's intake has improved since last visit (PO: 75-90%). Pt is also taking supplements  Reviewed wt hx; pt wt has been stable for the past week.   Medications reviewed lactulose, MVI, thiamine, and folic acid.   Labs reviewed: CBGS: 79-160 (inpatient orders for glycemic control are 0-15 units insulin aspart TID with meals, 0-5 units insulin aspart q HS).   Diet Order:   Diet Order            DIET DYS 3 Room service appropriate? No; Fluid consistency: Thin  Diet effective now              EDUCATION  NEEDS:   Not appropriate for education at this time  Skin:  Skin Assessment: Reviewed RN Assessment  Last BM:  08/12/19  Height:   Ht Readings from Last 1 Encounters:  08/09/19 5\' 8"  (1.727 m)    Weight:   Wt Readings from Last 1 Encounters:  08/14/19 62.4 kg    Ideal Body Weight:  70 kg  BMI:  Body mass index is 20.92 kg/m.  Estimated Nutritional Needs:   Kcal:  2000-2200  Protein:  105-120 grams  Fluid:  > 2 L    Tyler [name] A. Jimmye Norman, RD, LDN, [place] Registered Dietitian II Certified Diabetes Care and Education Specialist Pager: [phone] After hours Pager: [phone]

## 2019-08-14 NOTE — Progress Notes (Signed)
Patient ID: Tyler [name], male   DOB: 12-Oct-1971, 48 y.o.   MRN: [id number]  PROGRESS NOTE    Tyler [name]  [id number] DOB: 1971/11/14 DOA: 07/23/2019 PCP: Patient, No Pcp Per   Brief Narrative:  48 year old male with history of polysubstance abuse including heroin presented to [facility] ER after being found down with a syringe nearby.  He was given Narcan with minimal improvement.  Found in severe rhabdomyolysis with acute kidney injury and hyperkalemia.  He was started on IV fluids including bicarb drip and transferred to [facility] for further care.  Rhabdomyolysis improved but he had persistence of encephalopathy and was found to have acute bilateral cerebellar infarct.  Neurology was consulted.  Neurology has signed off.  PT recommends SNF placement.  Assessment & Plan: Acute metabolic encephalopathy: Multifactorial Still ongoing, not verbally responding to questions asked, sometimes refusing wound care, meals Most likely secondary to drug use, uremia, heavy alcohol use CT of the head and neck was initially negative for any traumatic finding UDS, Tylenol and salicylate levels were negative. TSH, folate, B12 levels all within normal limits EEG: Generalized diffuse slowing but no seizure activity Fall precautions  Acute bilateral CVA, nonhemorrhagic MRI of the brain showed acute CVA Neurology evaluated the patient and has signed off.  MRA of the head negative.  Ultrasound carotid showed bilateral 1 to 39% ICA stenosis Neurology recommends considering 30-day cardiac event monitoring as outpatient to rule out A. fib if patient neurologically improves LDL 83; A1c 5.1. Continue aspirin, will start statin once LFTs normalized. Echocardiogram: EF of 60 to 65% Lower extremity Dopplers: Negative for DVT Follow PT/OT/SLP recommendations.  PT recommends SNF.  Severe rhabdomyolysis/AKI Improving, creatinine peaked to 5.79 Renal ultrasound negative Daily CMP  Transaminitis with elevated  bilirubin Improving Right upper quadrant ultrasound showed hepatic steatosis with early cirrhosis with some gallbladder thickening Daily CMP  Alcohol abuse Advised to quit As per CIWA protocol Continue thiamine, multivitamin and folic acid.  Urinary tract infection Present on admission Completed 5 days of Rocephin  Essential hypertension Stable Continue amlodipine and metoprolol  Severe malnutrition Poor oral intake Dietitian on board, may consider initiation of nutrition support via tube feeds   DVT prophylaxis: Lovenox Code Status: Full Family Communication: None at bedside Disposition Plan: SNF, difficult placement  Consultants: Neurology  Procedures:  Echo IMPRESSIONS    1. The left ventricle has normal systolic function with an ejection fraction of 60-65%. The cavity size was normal. Left ventricular diastolic Doppler parameters are consistent with impaired relaxation. No evidence of left ventricular regional wall  motion abnormalities.  2. The right ventricle has normal systolic function. The cavity was normal. There is no increase in right ventricular wall thickness. Right ventricular systolic pressure is normal with an estimated pressure of 21.0 mmHg.  3. The aortic valve is tricuspid. Moderate sclerosis of the aortic valve.  4. The aorta is normal unless otherwise noted.  Antimicrobials:  Anti-infectives (From admission, onward)   Start     Dose/Rate Route Frequency Ordered Stop   07/25/19 0745  cefTRIAXone (ROCEPHIN) 1 g in sodium chloride 0.9 % 100 mL IVPB     1 g 200 mL/hr over 30 Minutes Intravenous Every 24 hours 07/25/19 0734 07/29/19 1518       Subjective: Patient seen and examined at bedside, refused to answer any of my questions.    Objective: Vitals:   08/14/19 0043 08/14/19 0520 08/14/19 0800 08/14/19 1253  BP: 111/76  128/79 93/75  Pulse: 72  79 73  Resp:   17 17  Temp: 98 F (36.7 C)  (!) 97.5 F (36.4 C) (!) 97.5 F (36.4 C)   TempSrc:   Oral Oral  SpO2: 100%  100% 100%  Weight:  62.4 kg    Height:        Intake/Output Summary (Last 24 hours) at 08/14/2019 1527 Last data filed at 08/14/2019 1100 Gross per 24 hour  Intake 1736 ml  Output 500 ml  Net 1236 ml   Filed Weights   08/12/19 0435 08/13/19 0500 08/14/19 0520  Weight: 63.1 kg 61.9 kg 62.4 kg    Examination:   General: NAD   Cardiovascular: S1, S2 present  Respiratory: CTAB  Abdomen: Soft, nontender, nondistended, bowel sounds present  Musculoskeletal: No bilateral pedal edema noted  Skin: Normal  Psychiatry:  Unable to assess     Data Reviewed: I have personally reviewed following labs and imaging studies  CBC: Recent Labs  Lab 08/08/19 0405 08/09/19 0423  WBC 6.7 6.8  NEUTROABS 3.9 4.0  HGB 10.5* 11.3*  HCT 33.2* 35.1*  MCV 96.8 95.9  PLT 308 299   Basic Metabolic Panel: Recent Labs  Lab 08/08/19 0405 08/09/19 0423 08/10/19 0352 08/11/19 0325 08/12/19 0428  NA 140 140 140 140 138  K 3.5 3.5 3.7 3.7 3.7  CL 108 104 104 107 104  CO2 _0 GLUCOSE 86 95 96 91 82  BUN 26* 26* 34* 24* 19  CREATININE 1.49* 1.44* 1.80* 1.33* 1.27*  CALCIUM 9.0 9.1 9.3 8.7* 9.1  MG 1.7  --   --   --   --    GFR: Estimated Creatinine Clearance: 61.4 mL/min (A) (by C-G formula based on SCr of 1.27 mg/dL (H)). Liver Function Tests: Recent Labs  Lab 08/08/19 0405 08/09/19 0423 08/10/19 0352 08/11/19 0325 08/12/19 0428  AST 128* 159* 174* 170* 173*  ALT 107* 136* 150* 157* 171*  ALKPHOS 73 81 81 75 83  BILITOT 1.6* 1.2 1.0 1.0 0.9  PROT 7.0 7.7 7.0 6.9 7.3  ALBUMIN 2.7* 3.1* 2.9* 2.7* 3.1*   No results for input(s): LIPASE, AMYLASE in the last 168 hours. No results for input(s): AMMONIA in the last 168 hours. Coagulation Profile: No results for input(s): INR, PROTIME in the last 168 hours. Cardiac Enzymes: No results for input(s): CKTOTAL, CKMB, CKMBINDEX, TROPONINI in the last 168 hours. BNP (last 3 results) No  results for input(s): PROBNP in the last 8760 hours. HbA1C: No results for input(s): HGBA1C in the last 72 hours. CBG: Recent Labs  Lab 08/13/19 1141 08/13/19 1646 08/13/19 2140 08/14/19 0807 08/14/19 1225  GLUCAP 86 160* 79 92 108*   Lipid Profile: No results for input(s): CHOL, HDL, LDLCALC, TRIG, CHOLHDL, LDLDIRECT in the last 72 hours. Thyroid Function Tests: No results for input(s): TSH, T4TOTAL, FREET4, T3FREE, THYROIDAB in the last 72 hours. Anemia Panel: No results for input(s): VITAMINB12, FOLATE, FERRITIN, TIBC, IRON, RETICCTPCT in the last 72 hours. Sepsis Labs: No results for input(s): PROCALCITON, LATICACIDVEN in the last 168 hours.  No results found for this or any previous visit (from the past 240 hour(s)).       Radiology Studies: No results found.      Scheduled Meds: .  stroke: mapping our early stages of recovery book   Does not apply Once  . amLODipine  10 mg Oral Daily  . aspirin  300 mg Rectal Daily   Or  . aspirin  325 mg Oral Daily  .  chlorhexidine gluconate (MEDLINE KIT)  15 mL Mouth Rinse BID  . enoxaparin (LOVENOX) injection  40 mg Subcutaneous Q24H  . feeding supplement (ENSURE ENLIVE)  237 mL Oral TID BM  . folic acid  1 mg Oral Daily  . insulin aspart  0-15 Units Subcutaneous TID WC  . insulin aspart  0-5 Units Subcutaneous QHS  . lactulose  20 g Oral BID   Or  . lactulose  300 mL Rectal BID  . mouth rinse  15 mL Mouth Rinse BID  . metoprolol tartrate  25 mg Oral BID  . multivitamin with minerals  1 tablet Oral Daily  . thiamine  100 mg Oral Daily   Or  . thiamine  100 mg Intravenous Daily   Continuous Infusions:    LOS: 22 days        Alma Friendly, MD [facility] 08/14/2019, 3:27 PM

## 2019-08-14 NOTE — Progress Notes (Signed)
Occupational Therapy Treatment Patient Details Name: Tyrone AppleSteven Fairley MRN: [id number] DOB: 06/11/1971 Today's Date: 08/14/2019    History of present illness Pt is a 48 y/o male transferred from [place] [facility] for unresponsiveness, likely secondary to substance abuse. Pt with rhabdomyolysis and AKI. EEG revealed moderate diffuse encephalopathy. PMH includes polysubstance abuse and HTN.    OT comments  Pt progressing toward stated goals, cont to be limited 2/2 cognitive status. Pt max A for supine to sit, mostly appears limited in ability to initiate tasks. Once seated, OT did starboard sitting with pt to maintain upright posture to brush teeth at mod A. Pt automatically putting brush in his mouth without paste, brushing around his mouth despite cueing. Again, initiation cues needed as well as h/h hand cues and verbal cues. When asked to clear mouth, pt just drinking water and not rinsing. He is unable to state his name this date, frequently saying "I don't know" and "yes" to everything. No ability to recall information throughout session despite cues. Pt is laughing and joking with therapist at some level this date. D/c recs remain appropriate. Will continue to follow.   Follow Up Recommendations  SNF;Supervision/Assistance - 24 hour    Equipment Recommendations  None recommended by OT    Recommendations for Other Services      Precautions / Restrictions Precautions Precautions: Fall Restrictions Weight Bearing Restrictions: No       Mobility Bed Mobility Overal bed mobility: Needs Assistance Bed Mobility: Supine to Sit;Sit to Supine     Supine to sit: Mod assist;Max assist Sit to supine: Min assist   General bed mobility comments: cues to initiate transfer, pull up at trunk and square hips. Pt more automatic in lying down, requiring min A and boost in bed for optimum positioning  Transfers                      Balance Overall balance assessment: Needs  assistance Sitting-balance support: Bilateral upper extremity supported;Feet supported Sitting balance-Leahy Scale: Poor Sitting balance - Comments: staboard sit from OT while brushing teeth Postural control: Left lateral lean                                 ADL either performed or assessed with clinical judgement   ADL Overall ADL's : Needs assistance/impaired     Grooming: Moderate assistance;Cueing for sequencing;Cueing for safety;Sitting Grooming Details (indicate cue type and reason): sitting EOB, multimodal cues needed for initiation, sequencing, attention and successful completion                               General ADL Comments: Pt continues to be limited 2/2 cognitive deficits and generalized weakness, prgoressed BADL engagement this date     Vision   Vision Assessment?: Vision impaired- to be further tested in functional context Additional Comments: squints eyes closed often, will continue to assess; difficult with decreased cognition   Perception     Praxis      Cognition Arousal/Alertness: Awake/alert Behavior During Therapy: Flat affect Overall Cognitive Status: Impaired/Different from baseline Area of Impairment: Orientation;Attention;Following commands;Awareness                 Orientation Level: Person;Place;Time;Situation Current Attention Level: Focused Memory: Decreased recall of precautions;Decreased short-term memory Following Commands: Follows one step commands inconsistently;Follows one step commands with increased time Safety/Judgement: Decreased awareness of  deficits;Decreased awareness of safety Awareness: Intellectual Problem Solving: Slow processing;Decreased initiation;Requires verbal cues;Requires tactile cues;Difficulty sequencing General Comments: pt stating "i dont know" to name this date. Showing pronounced difficulty with sequencing and initiation with BADL task. Poor awareness overall        Exercises      Shoulder Instructions       General Comments      Pertinent Vitals/ Pain       Pain Assessment: No/denies pain Faces Pain Scale: No hurt  Home Living                                          Prior Functioning/Environment              Frequency  Min 2X/week        Progress Toward Goals  OT Goals(current goals can now be found in the care plan section)  Progress towards OT goals: Progressing toward goals  Acute Rehab OT Goals Patient Stated Goal: none stated OT Goal Formulation: With patient Time For Goal Achievement: 08/28/19 Potential to Achieve Goals: [place] Discharge plan remains appropriate;Frequency remains appropriate    Co-evaluation    PT/OT/SLP Co-Evaluation/Treatment: Yes Reason for Co-Treatment: Necessary to address cognition/behavior during functional activity;To address functional/ADL transfers   OT goals addressed during session: ADL's and self-care SLP goals addressed during session: Cognition    AM-PAC OT "6 Clicks" Daily Activity     Outcome Measure   Help from another person eating meals?: A Lot Help from another person taking care of personal grooming?: A Lot Help from another person toileting, which includes using toliet, bedpan, or urinal?: Total Help from another person bathing (including washing, rinsing, drying)?: Total Help from another person to put on and taking off regular upper body clothing?: A Lot Help from another person to put on and taking off regular lower body clothing?: Total 6 Click Score: 9    End of Session Equipment Utilized During Treatment: Gait belt  OT Visit Diagnosis: Other abnormalities of gait and mobility (R26.89);Other symptoms and signs involving cognitive function;Other symptoms and signs involving the nervous system (R29.898);Muscle weakness (generalized) (M62.81)   Activity Tolerance Patient tolerated treatment well   Patient Left in bed;with call bell/phone within  reach;with bed alarm set   Nurse Communication Mobility status        Time: 8101-7510 OT Time Calculation (min): 24 min  Charges: OT General Charges $OT Visit: 1 Visit OT Treatments $Self Care/Home Management : 8-22 mins  Zenovia Jarred, MSOT, OTR/L [facility] Office: [place] 08/14/2019, 4:32 PM

## 2019-08-15 LAB — COMPREHENSIVE METABOLIC PANEL
ALT: 147 U/L — ABNORMAL HIGH (ref 0–44)
AST: 124 U/L — ABNORMAL HIGH (ref 15–41)
Albumin: 3.3 g/dL — ABNORMAL LOW (ref 3.5–5.0)
Alkaline Phosphatase: 73 U/L (ref 38–126)
Anion gap: 7 (ref 5–15)
BUN: 25 mg/dL — ABNORMAL HIGH (ref 6–20)
CO2: 26 mmol/L (ref 22–32)
Calcium: 9.6 mg/dL (ref 8.9–10.3)
Chloride: 102 mmol/L (ref 98–111)
Creatinine, Ser: 1.54 mg/dL — ABNORMAL HIGH (ref 0.61–1.24)
GFR calc Af Amer: >60 mL/min (ref >60)
GFR calc non Af Amer: 52 mL/min — ABNORMAL LOW (ref >60)
Glucose, Bld: 92 mg/dL (ref 70–99)
Potassium: 3.7 mmol/L (ref 3.5–5.1)
Sodium: 135 mmol/L (ref 135–145)
Total Bilirubin: 0.9 mg/dL (ref 0.3–1.2)
Total Protein: 7.4 g/dL (ref 6.5–8.1)

## 2019-08-15 LAB — CBC WITH DIFFERENTIAL/PLATELET
Abs Immature Granulocytes: 0.03 10*3/uL (ref 0.00–0.07)
Basophils Absolute: 0 10*3/uL (ref 0.0–0.1)
Basophils Relative: 1 %
Eosinophils Absolute: 0.2 10*3/uL (ref 0.0–0.5)
Eosinophils Relative: 3 %
HCT: 35.4 % — ABNORMAL LOW (ref 39.0–52.0)
Hemoglobin: 11.3 g/dL — ABNORMAL LOW (ref 13.0–17.0)
Immature Granulocytes: 1 %
Lymphocytes Relative: 34 %
Lymphs Abs: 2.2 10*3/uL (ref 0.7–4.0)
MCH: 30.8 pg (ref 26.0–34.0)
MCHC: 31.9 g/dL (ref 30.0–36.0)
MCV: 96.5 fL (ref 80.0–100.0)
Monocytes Absolute: 0.8 10*3/uL (ref 0.1–1.0)
Monocytes Relative: 12 %
Neutro Abs: 3.4 10*3/uL (ref 1.7–7.7)
Neutrophils Relative %: 49 %
Platelets: 282 10*3/uL (ref 150–400)
RBC: 3.67 MIL/uL — ABNORMAL LOW (ref 4.22–5.81)
RDW: 17.2 % — ABNORMAL HIGH (ref 11.5–15.5)
WBC: 6.6 10*3/uL (ref 4.0–10.5)
nRBC: 0 % (ref 0.0–0.2)

## 2019-08-15 LAB — GLUCOSE, CAPILLARY
Glucose-Capillary: 76 mg/dL (ref 70–99)
Glucose-Capillary: 87 mg/dL (ref 70–99)
Glucose-Capillary: 119 mg/dL — ABNORMAL HIGH (ref 70–99)
Glucose-Capillary: 117 mg/dL — ABNORMAL HIGH (ref 70–99)

## 2019-08-15 NOTE — Progress Notes (Signed)
Patient ID: Tyler [name], male   DOB: June 17, 1971, 48 y.o.   MRN: [id number]  PROGRESS NOTE    Tyler [name]  [id number] DOB: 02-Jun-1971 DOA: 07/23/2019 PCP: Patient, No Pcp Per   Brief Narrative:  48 year old male with history of polysubstance abuse including heroin presented to [facility] ER after being found down with a syringe nearby.  He was given Narcan with minimal improvement.  Found in severe rhabdomyolysis with acute kidney injury and hyperkalemia.  He was started on IV fluids including bicarb drip and transferred to [facility] for further care.  Rhabdomyolysis improved but he had persistence of encephalopathy and was found to have acute bilateral cerebellar infarct.  Neurology was consulted.  Neurology has signed off.  PT recommends SNF placement.  Assessment & Plan:  Acute metabolic encephalopathy: Multifactorial -Still ongoing, not verbally responding to questions asked, sometimes refusing wound care, meals -Most likely secondary to drug use, uremia, heavy alcohol use -CT of the head and neck was initially negative for any traumatic finding -UDS, Tylenol and salicylate levels were negative. -TSH, folate, B12 levels all within normal limits -EEG: Generalized diffuse slowing but no seizure activity -Had required intermittent restraints initially during the hospitalization. -Fall precautions -Awaiting SNF placement.  Acute bilateral CVA, nonhemorrhagic -MRI of the brain showed acute CVA -Neurology evaluated the patient and has signed off.  MRA of the head negative.  Ultrasound carotid showed bilateral 1 to 39% ICA stenosis -Neurology recommends considering 30-day cardiac event monitoring as outpatient to rule out A. fib if patient neurologically improves -LDL 83; A1c 5.1. -Continue aspirin, will start statin once LFTs normalized. -Echocardiogram: EF of 60 to 65% -Lower extremity Dopplers: Negative for DVT - PT recommends SNF.  Severe rhabdomyolysis AKI -Improving,  creatinine peaked to 5.79 -Renal ultrasound negative -Creatinine 1.54 today.  Monitor intermittently.  Off IV fluids  Transaminitis with elevated bilirubin -Improving -Right upper quadrant ultrasound showed hepatic steatosis with early cirrhosis with some gallbladder thickening -Monitor intermittently.  Alcohol abuse -Advised to quit -Continue thiamine, multivitamin and folic acid.  Urinary tract infection -Present on admission -Completed 5 days of Rocephin  Essential hypertension -Stable -Continue metoprolol  Severe malnutrition -Poor oral intake -Follow nutrition recommendations  DVT prophylaxis: Lovenox Code Status: Full Family Communication: None at bedside Disposition Plan: SNF once bed available, difficult placement  Consultants: Neurology  Procedures:  Echo IMPRESSIONS    1. The left ventricle has normal systolic function with an ejection fraction of 60-65%. The cavity size was normal. Left ventricular diastolic Doppler parameters are consistent with impaired relaxation. No evidence of left ventricular regional wall  motion abnormalities.  2. The right ventricle has normal systolic function. The cavity was normal. There is no increase in right ventricular wall thickness. Right ventricular systolic pressure is normal with an estimated pressure of 21.0 mmHg.  3. The aortic valve is tricuspid. Moderate sclerosis of the aortic valve.  4. The aorta is normal unless otherwise noted.  Antimicrobials:  Anti-infectives (From admission, onward)   Start     Dose/Rate Route Frequency Ordered Stop   07/25/19 0745  cefTRIAXone (ROCEPHIN) 1 g in sodium chloride 0.9 % 100 mL IVPB     1 g 200 mL/hr over 30 Minutes Intravenous Every 24 hours 07/25/19 0734 07/29/19 1518       Subjective: Patient seen and examined at bedside.  He is sleepy, wakes up on calling his name but hardly answers any questions.  Very poor historian.  No overnight fever or vomiting reported.  Objective: Vitals:   08/15/19 0052 08/15/19 0358 08/15/19 0522 08/15/19 0858  BP: 138/75 (!) 135/95  132/90  Pulse: 60 76    Resp:      Temp: 98.2 F (36.8 C) 98.2 F (36.8 C)    TempSrc: Oral Oral    SpO2: (!) 86% 100%    Weight:   61.1 kg   Height:        Intake/Output Summary (Last 24 hours) at 08/15/2019 1030 Last data filed at 08/14/2019 1100 Gross per 24 hour  Intake --  Output 250 ml  Net -250 ml   Filed Weights   08/13/19 0500 08/14/19 0520 08/15/19 0522  Weight: 61.9 kg 62.4 kg 61.1 kg    Examination:  General exam: No distress.  Wakes up slightly, hardly answers any questions.    Respiratory system: Bilateral decreased breath sounds at bases, no wheezing cardiovascular system: Rate controlled, S1-S2 heard gastrointestinal system: Abdomen is nondistended, soft and nontender. Normal bowel sounds heard. Extremities: No cyanosis, edema      Data Reviewed: I have personally reviewed following labs and imaging studies  CBC: Recent Labs  Lab 08/09/19 0423 08/15/19 0620  WBC 6.8 6.6  NEUTROABS 4.0 3.4  HGB 11.3* 11.3*  HCT 35.1* 35.4*  MCV 95.9 96.5  PLT 329 465   Basic Metabolic Panel: Recent Labs  Lab 08/09/19 0423 08/10/19 0352 08/11/19 0325 08/12/19 0428 08/15/19 0620  NA 140 140 140 138 135  K 3.5 3.7 3.7 3.7 3.7  CL 104 104 107 104 102  CO2 '25 26 24 25 26  ' GLUCOSE 95 96 91 82 92  BUN 26* 34* 24* 19 25*  CREATININE 1.44* 1.80* 1.33* 1.27* 1.54*  CALCIUM 9.1 9.3 8.7* 9.1 9.6   GFR: Estimated Creatinine Clearance: 49.6 mL/min (A) (by C-G formula based on SCr of 1.54 mg/dL (H)). Liver Function Tests: Recent Labs  Lab 08/09/19 0423 08/10/19 0352 08/11/19 0325 08/12/19 0428 08/15/19 0620  AST 159* 174* 170* 173* 124*  ALT 136* 150* 157* 171* 147*  ALKPHOS 81 81 75 83 73  BILITOT 1.2 1.0 1.0 0.9 0.9  PROT 7.7 7.0 6.9 7.3 7.4  ALBUMIN 3.1* 2.9* 2.7* 3.1* 3.3*   No results for input(s): LIPASE, AMYLASE in the last 168 hours. No  results for input(s): AMMONIA in the last 168 hours. Coagulation Profile: No results for input(s): INR, PROTIME in the last 168 hours. Cardiac Enzymes: No results for input(s): CKTOTAL, CKMB, CKMBINDEX, TROPONINI in the last 168 hours. BNP (last 3 results) No results for input(s): PROBNP in the last 8760 hours. HbA1C: No results for input(s): HGBA1C in the last 72 hours. CBG: Recent Labs  Lab 08/14/19 0807 08/14/19 1225 08/14/19 1705 08/14/19 2108 08/15/19 0813  GLUCAP 92 108* 80 87 76   Lipid Profile: No results for input(s): CHOL, HDL, LDLCALC, TRIG, CHOLHDL, LDLDIRECT in the last 72 hours. Thyroid Function Tests: No results for input(s): TSH, T4TOTAL, FREET4, T3FREE, THYROIDAB in the last 72 hours. Anemia Panel: No results for input(s): VITAMINB12, FOLATE, FERRITIN, TIBC, IRON, RETICCTPCT in the last 72 hours. Sepsis Labs: No results for input(s): PROCALCITON, LATICACIDVEN in the last 168 hours.  No results found for this or any previous visit (from the past 240 hour(s)).       Radiology Studies: No results found.      Scheduled Meds:   stroke: mapping our early stages of recovery book   Does not apply Once   aspirin  300 mg Rectal Daily  Or   aspirin  325 mg Oral Daily   chlorhexidine gluconate (MEDLINE KIT)  15 mL Mouth Rinse BID   enoxaparin (LOVENOX) injection  40 mg Subcutaneous Q24H   feeding supplement (ENSURE ENLIVE)  237 mL Oral TID BM   folic acid  1 mg Oral Daily   insulin aspart  0-15 Units Subcutaneous TID WC   insulin aspart  0-5 Units Subcutaneous QHS   lactulose  20 g Oral BID   Or   lactulose  300 mL Rectal BID   mouth rinse  15 mL Mouth Rinse BID   metoprolol tartrate  25 mg Oral BID   multivitamin with minerals  1 tablet Oral Daily   thiamine  100 mg Oral Daily   Or   thiamine  100 mg Intravenous Daily   Continuous Infusions:    LOS: 23 days        Aline August, MD [facility] 08/15/2019, 10:30 AM

## 2019-08-15 NOTE — Progress Notes (Signed)
Returned phone call to Pt daughter Threasa Beards for update.

## 2019-08-16 LAB — GLUCOSE, CAPILLARY
Glucose-Capillary: 80 mg/dL (ref 70–99)
Glucose-Capillary: 114 mg/dL — ABNORMAL HIGH (ref 70–99)
Glucose-Capillary: 89 mg/dL (ref 70–99)
Glucose-Capillary: 133 mg/dL — ABNORMAL HIGH (ref 70–99)

## 2019-08-16 NOTE — Progress Notes (Signed)
  Speech Language Pathology Treatment: Cognitive-Linquistic  Patient Details Name: Tyler [name] MRN: [id number] DOB: Oct 28, 1971 Today's Date: 08/16/2019 Time: 6599-3570 SLP Time Calculation (min) (ACUTE ONLY): 37 min  Assessment / Plan / Recommendation Clinical Impression  Patient seen for cognitive linguistic tx today. He was seen during lunch and goals were addressed by having patient make request (wants and needs) during his meal as well as answer questions during conversation. He had 90% accuracy with answering y/n questions with minimal cues. Patient was also answering biographical questions with single word answers (profession, where he lives, married, age) with 100% accuracy.    HPI HPI: Pt is a 48 y.o. male with medical history significant of polysubstance abuse including heroin who was brought in to [facility] ED on 07/23/19 after being found down by friend with nearby syringe. He was transferred to [facility] for further care. MRI of the brain of 07/25/19 showed patchy acute bilateral cerebral and cerebellar infarcts. Right temporal lobe encephalomalacia and chronic cerebral white matter disease.       SLP Plan  Continue with current plan of care       Recommendations  Diet recommendations: Regular;Thin liquid Liquids provided via: Cup;Straw Medication Administration: Whole meds with liquid Supervision: Trained caregiver to feed patient Compensations: Slow rate;Small sips/bites                Plan: Continue with current plan of care       [place], MA, CCC-SLP 08/16/2019 1:08 PM

## 2019-08-16 NOTE — Progress Notes (Signed)
Patient ID: Tyler [name], male   DOB: 26-Jan-1971, 48 y.o.   MRN: [id number]  PROGRESS NOTE    Tyler [name]  ELF:[id number] DOB: 1971/07/24 DOA: 07/23/2019 PCP: Patient, No Pcp Per   Brief Narrative:  48 year old male with history of polysubstance abuse including heroin presented to [facility] ER after being found down with a syringe nearby.  He was given Narcan with minimal improvement.  Found in severe rhabdomyolysis with acute kidney injury and hyperkalemia.  He was started on IV fluids including bicarb drip and transferred to [facility] for further care.  Rhabdomyolysis improved but he had persistence of encephalopathy and was found to have acute bilateral cerebellar infarct.  Neurology was consulted.  Neurology has signed off.  PT recommends SNF placement.  Assessment & Plan:  Acute metabolic encephalopathy: Multifactorial -Still ongoing, not verbally responding to questions asked, sometimes refusing wound care, meals -Most likely secondary to drug use, uremia, heavy alcohol use -CT of the head and neck was initially negative for any traumatic finding -UDS, Tylenol and salicylate levels were negative. -TSH, folate, B12 levels all within normal limits -EEG: Generalized diffuse slowing but no seizure activity -Had required intermittent restraints initially during the hospitalization. -Fall precautions -Awaiting SNF placement.  Acute bilateral CVA, nonhemorrhagic -MRI of the brain showed acute CVA -Neurology evaluated the patient and has signed off.  MRA of the head negative.  Ultrasound carotid showed bilateral 1 to 39% ICA stenosis -Neurology recommends considering 30-day cardiac event monitoring as outpatient to rule out A. fib if patient neurologically improves -LDL 83; A1c 5.1. -Continue aspirin, will start statin once LFTs normalized. -Echocardiogram: EF of 60 to 65% -Lower extremity Dopplers: Negative for DVT - PT recommends SNF.  Severe rhabdomyolysis AKI -Improving,  creatinine peaked to 5.79 -Renal ultrasound negative -Monitor intermittently.  Off IV fluids  Transaminitis with elevated bilirubin -Improving -Right upper quadrant ultrasound showed hepatic steatosis with early cirrhosis with some gallbladder thickening -Monitor intermittently.  Alcohol abuse -Advised to quit -Continue thiamine, multivitamin and folic acid.  Urinary tract infection -Present on admission -Completed 5 days of Rocephin  Essential hypertension -Stable -Continue metoprolol  Severe malnutrition -Poor oral intake -Follow nutrition recommendations  DVT prophylaxis: Lovenox Code Status: Full Family Communication: None at bedside Disposition Plan: SNF once bed available, difficult placement  Consultants: Neurology  Procedures:  Echo IMPRESSIONS    1. The left ventricle has normal systolic function with an ejection fraction of 60-65%. The cavity size was normal. Left ventricular diastolic Doppler parameters are consistent with impaired relaxation. No evidence of left ventricular regional wall  motion abnormalities.  2. The right ventricle has normal systolic function. The cavity was normal. There is no increase in right ventricular wall thickness. Right ventricular systolic pressure is normal with an estimated pressure of 21.0 mmHg.  3. The aortic valve is tricuspid. Moderate sclerosis of the aortic valve.  4. The aorta is normal unless otherwise noted.  Antimicrobials:  Anti-infectives (From admission, onward)   Start     Dose/Rate Route Frequency Ordered Stop   07/25/19 0745  cefTRIAXone (ROCEPHIN) 1 g in sodium chloride 0.9 % 100 mL IVPB     1 g 200 mL/hr over 30 Minutes Intravenous Every 24 hours 07/25/19 0734 07/29/19 1518       Subjective: Patient seen and examined at bedside.  Very poor historian.  Hardly participates in any kind of conversation.  No overnight fever or vomiting reported by nursing staff.  Objective: Vitals:   08/15/19 2018  08/16/19 0009 08/16/19  0414 08/16/19 0415  BP: (!) 160/103 132/87 111/87   Pulse: 70 70 62   Resp: '17 16 17   ' Temp: 97.6 F (36.4 C) 98.9 F (37.2 C) 97.7 F (36.5 C)   TempSrc: Oral     SpO2: 100% 100% 100%   Weight:    62.2 kg  Height:        Intake/Output Summary (Last 24 hours) at 08/16/2019 0737 Last data filed at 08/16/2019 0548 Gross per 24 hour  Intake 560 ml  Output 675 ml  Net -115 ml   Filed Weights   08/14/19 0520 08/15/19 0522 08/16/19 0415  Weight: 62.4 kg 61.1 kg 62.2 kg    Examination:  General exam:  No acute distress.  Wakes up slightly, hardly answers any questions.    Respiratory system: Bilateral decreased breath sounds at bases, with some scattered crackles  cardiovascular system:  S1-S2 heard, rate controlled gastrointestinal system: Abdomen is nondistended, soft and nontender. Normal bowel sounds heard. Extremities: No cyanosis, edema      Data Reviewed: I have personally reviewed following labs and imaging studies  CBC: Recent Labs  Lab 08/15/19 0620  WBC 6.6  NEUTROABS 3.4  HGB 11.3*  HCT 35.4*  MCV 96.5  PLT 166   Basic Metabolic Panel: Recent Labs  Lab 08/10/19 0352 08/11/19 0325 08/12/19 0428 08/15/19 0620  NA 140 140 138 135  K 3.7 3.7 3.7 3.7  CL 104 107 104 102  CO2 '26 24 25 26  ' GLUCOSE 96 91 82 92  BUN 34* 24* 19 25*  CREATININE 1.80* 1.33* 1.27* 1.54*  CALCIUM 9.3 8.7* 9.1 9.6   GFR: Estimated Creatinine Clearance: 50.5 mL/min (A) (by C-G formula based on SCr of 1.54 mg/dL (H)). Liver Function Tests: Recent Labs  Lab 08/10/19 0352 08/11/19 0325 08/12/19 0428 08/15/19 0620  AST 174* 170* 173* 124*  ALT 150* 157* 171* 147*  ALKPHOS 81 75 83 73  BILITOT 1.0 1.0 0.9 0.9  PROT 7.0 6.9 7.3 7.4  ALBUMIN 2.9* 2.7* 3.1* 3.3*   No results for input(s): LIPASE, AMYLASE in the last 168 hours. No results for input(s): AMMONIA in the last 168 hours. Coagulation Profile: No results for input(s): INR, PROTIME in the  last 168 hours. Cardiac Enzymes: No results for input(s): CKTOTAL, CKMB, CKMBINDEX, TROPONINI in the last 168 hours. BNP (last 3 results) No results for input(s): PROBNP in the last 8760 hours. HbA1C: No results for input(s): HGBA1C in the last 72 hours. CBG: Recent Labs  Lab 08/14/19 2108 08/15/19 0813 08/15/19 1208 08/15/19 1644 08/15/19 2212  GLUCAP 87 76 87 119* 117*   Lipid Profile: No results for input(s): CHOL, HDL, LDLCALC, TRIG, CHOLHDL, LDLDIRECT in the last 72 hours. Thyroid Function Tests: No results for input(s): TSH, T4TOTAL, FREET4, T3FREE, THYROIDAB in the last 72 hours. Anemia Panel: No results for input(s): VITAMINB12, FOLATE, FERRITIN, TIBC, IRON, RETICCTPCT in the last 72 hours. Sepsis Labs: No results for input(s): PROCALCITON, LATICACIDVEN in the last 168 hours.  No results found for this or any previous visit (from the past 240 hour(s)).       Radiology Studies: No results found.      Scheduled Meds: .  stroke: mapping our early stages of recovery book   Does not apply Once  . aspirin  300 mg Rectal Daily   Or  . aspirin  325 mg Oral Daily  . chlorhexidine gluconate (MEDLINE KIT)  15 mL Mouth Rinse BID  . enoxaparin (LOVENOX) injection  40 mg Subcutaneous Q24H  . feeding supplement (ENSURE ENLIVE)  237 mL Oral TID BM  . folic acid  1 mg Oral Daily  . insulin aspart  0-15 Units Subcutaneous TID WC  . insulin aspart  0-5 Units Subcutaneous QHS  . lactulose  20 g Oral BID   Or  . lactulose  300 mL Rectal BID  . mouth rinse  15 mL Mouth Rinse BID  . metoprolol tartrate  25 mg Oral BID  . multivitamin with minerals  1 tablet Oral Daily  . thiamine  100 mg Oral Daily   Or  . thiamine  100 mg Intravenous Daily   Continuous Infusions:    LOS: 24 days        Aline August, MD [facility] 08/16/2019, 7:37 AM

## 2019-08-16 NOTE — Progress Notes (Signed)
Physical Therapy Treatment Patient Details Name: Tyler [name] MRN: [id number] DOB: Jun 21, 1971 Today's Date: 08/16/2019    History of Present Illness Pt is a 48 y/o male transferred from [facility] for unresponsiveness, likely secondary to substance abuse. Pt with rhabdomyolysis and AKI. EEG revealed moderate diffuse encephalopathy. PMH includes polysubstance abuse and HTN.     PT Comments    Patient received in bed, lying on his left side. Eyes kept closed throughout session. Patient minimally responsive. Says "yes" or "no". Does not follow instructions for exercise or mobility, however repositions self in bed volitionally. Patient will benefit from continued skilled PT to improve independence with mobility.       Follow Up Recommendations  SNF;Supervision for mobility/OOB     Equipment Recommendations  Rolling walker with 5" wheels;3in1 (PT)    Recommendations for Other Services       Precautions / Restrictions Precautions Precautions: Fall Precaution Comments: poor safety awareness Restrictions Weight Bearing Restrictions: No    Mobility  Bed Mobility Overal bed mobility: Needs Assistance Bed Mobility: Rolling;Sidelying to Sit;Sit to Sidelying Rolling: Max assist Sidelying to sit: Max assist     Sit to sidelying: Max assist General bed mobility comments: patient continues to roll onto his left side, cross legs while Im trying to assist with mobility/LE exercises.  Transfers                 General transfer comment: unable, too lethargic  Ambulation/Gait             General Gait Details: unable   Stairs             Wheelchair Mobility    Modified Rankin (Stroke Patients Only)       Balance Overall balance assessment: Needs assistance Sitting-balance support: Feet supported;Bilateral upper extremity supported Sitting balance-Leahy Scale: Zero                                      Cognition Arousal/Alertness:  Lethargic Behavior During Therapy: Flat affect Overall Cognitive Status: No family/caregiver present to determine baseline cognitive functioning Area of Impairment: Orientation;Attention;Awareness;Problem solving;Following commands;Safety/judgement                 Orientation Level: Disoriented to;Place;Time;Situation   Memory: Decreased recall of precautions;Decreased short-term memory Following Commands: Follows one step commands inconsistently Safety/Judgement: Decreased awareness of safety;Decreased awareness of deficits Awareness: Intellectual Problem Solving: Slow processing;Decreased initiation;Difficulty sequencing;Requires verbal cues;Requires tactile cues General Comments: states "yes" or "No" to directions or instructions, poor awareness, eyes closed throughout session.      Exercises Total Joint Exercises Ankle Circles/Pumps: PROM;10 reps;Both Heel Slides: PROM;10 reps;Both Hip ABduction/ADduction: PROM;10 reps;Both    General Comments General comments (skin integrity, edema, etc.): Patient moves in bed volitionally, however does not move when asked to do so.      Pertinent Vitals/Pain Pain Assessment: Faces Faces Pain Scale: Hurts a little bit Pain Location: grimacing when assisted patient to roll from his side to his back. Pain Descriptors / Indicators: Grimacing Pain Intervention(s): Monitored during session    Home Living                      Prior Function            PT Goals (current goals can now be found in the care plan section) Acute Rehab PT Goals Patient Stated Goal: none stated PT Goal Formulation:  Patient unable to participate in goal setting Time For Goal Achievement: 08/23/19 Potential to Achieve Goals: Fair Progress towards PT goals: Not progressing toward goals - comment(unable to participate)    Frequency    Min 2X/week      PT Plan Current plan remains appropriate    Co-evaluation              AM-PAC PT  "6 Clicks" Mobility   Outcome Measure  Help needed turning from your back to your side while in a flat bed without using bedrails?: None Help needed moving from lying on your back to sitting on the side of a flat bed without using bedrails?: Total Help needed moving to and from a bed to a chair (including a wheelchair)?: Total Help needed standing up from a chair using your arms (e.g., wheelchair or bedside chair)?: Total Help needed to walk in hospital room?: Total Help needed climbing 3-5 steps with a railing? : Total 6 Click Score: 9    End of Session   Activity Tolerance: Patient limited by lethargy Patient left: in bed;with bed alarm set;with call bell/phone within reach Nurse Communication: Mobility status PT Visit Diagnosis: Muscle weakness (generalized) (M62.81);Difficulty in walking, not elsewhere classified (R26.2);Other abnormalities of gait and mobility (R26.89)     Time: [id number] PT Time Calculation (min) (ACUTE ONLY): 8 min  Charges:  $Therapeutic Exercise: 8-22 mins                     Carleena Mires, PT, GCS 08/16/19,12:24 PM

## 2019-08-16 NOTE — Progress Notes (Deleted)
  Speech Language Pathology Treatment: Cognitive-Linquistic  Patient Details Name: Tyler [name] MRN: [id number] DOB: 15-Jun-1971 Today's Date: 08/16/2019 Time:  -     Assessment / Plan / Recommendation Clinical Impression    Patient seen for cognitive linguistic tx today. He was seen during lunch and goals were addressed by having patient make request (wants and needs) during his meal as well as answer questions during conversation. He had 90% accuracy with answering y/n questions with minimal cues. Patient was also answering biographical questions with single word answers (profession, where he lives, married, age) with 100% accuracy.       HPI HPI: Pt is a 48 y.o. male with medical history significant of polysubstance abuse including heroin who was brought in to [facility] ED on 07/23/19 after being found down by friend with nearby syringe. He was transferred to [facility] for further care. MRI of the brain of 07/25/19 showed patchy acute bilateral cerebral and cerebellar infarcts. Right temporal lobe encephalomalacia and chronic cerebral white matter disease.       SLP Plan    Continue plan of care                                 [place], MA, CCC-SLP 08/16/2019 1:10 PM

## 2019-08-16 NOTE — Progress Notes (Signed)
Pt daughter Threasa Beards called and received telephone report/update.

## 2019-08-17 LAB — GLUCOSE, CAPILLARY
Glucose-Capillary: 85 mg/dL (ref 70–99)
Glucose-Capillary: 128 mg/dL — ABNORMAL HIGH (ref 70–99)
Glucose-Capillary: 123 mg/dL — ABNORMAL HIGH (ref 70–99)
Glucose-Capillary: 123 mg/dL — ABNORMAL HIGH (ref 70–99)

## 2019-08-17 MED ORDER — CHLORHEXIDINE GLUCONATE 0.12 % MT SOLN
OROMUCOSAL | Status: AC
  Administered 2019-08-17: 10:00:00
  Filled 2019-08-17: qty 15

## 2019-08-17 MED ORDER — ENSURE MAX PROTEIN PO LIQD
237.0000 mL | Freq: Three times a day (TID) | ORAL | Status: DC
  Administered 2019-08-17 – 2019-08-21 (×10): 237 mL via ORAL
  Filled 2019-08-17 (×14): qty 330

## 2019-08-17 NOTE — Progress Notes (Signed)
Patient ID: Tyler [name], male   DOB: 05/08/1971, 47 y.o.   MRN: [id number]  PROGRESS NOTE    Tyler [name]  KVQ:[id number] DOB: 12/07/1970 DOA: 07/23/2019 PCP: Patient, No Pcp Per   Brief Narrative:  48 year old male with history of polysubstance abuse including heroin presented to [facility] ER after being found down with a syringe nearby.  He was given Narcan with minimal improvement.  Found in severe rhabdomyolysis with acute kidney injury and hyperkalemia.  He was started on IV fluids including bicarb drip and transferred to [facility] for further care.  Rhabdomyolysis improved but he had persistence of encephalopathy and was found to have acute bilateral cerebellar infarct.  Neurology was consulted.  Neurology has signed off.  PT recommends SNF placement.  Assessment & Plan:  Acute metabolic encephalopathy: Multifactorial -Still ongoing, not verbally responding to questions asked, sometimes refusing wound care, meals -Most likely secondary to drug use, uremia, heavy alcohol use -CT of the head and neck was initially negative for any traumatic finding -UDS, Tylenol and salicylate levels were negative. -TSH, folate, B12 levels all within normal limits -EEG: Generalized diffuse slowing but no seizure activity -Had required intermittent restraints initially during the hospitalization. -Fall precautions -Awaiting SNF placement.  Acute bilateral CVA, nonhemorrhagic -MRI of the brain showed acute CVA -Neurology evaluated the patient and has signed off.  MRA of the head negative.  Ultrasound carotid showed bilateral 1 to 39% ICA stenosis -Neurology recommends considering 30-day cardiac event monitoring as outpatient to rule out A. fib if patient neurologically improves -LDL 83; A1c 5.1. -Continue aspirin, will start statin once LFTs normalized. -Echocardiogram: EF of 60 to 65% -Lower extremity Dopplers: Negative for DVT - PT recommends SNF.  Severe rhabdomyolysis AKI -Improving,  creatinine peaked to 5.79 -Renal ultrasound negative -Monitor intermittently.  Off IV fluids  Transaminitis with elevated bilirubin -Improving -Right upper quadrant ultrasound showed hepatic steatosis with early cirrhosis with some gallbladder thickening -Monitor intermittently.  Alcohol abuse -Advised to quit -Continue thiamine, multivitamin and folic acid.  Urinary tract infection -Present on admission -Completed 5 days of Rocephin  Essential hypertension -Stable -Continue metoprolol  Severe malnutrition -Poor oral intake -Follow nutrition recommendations  DVT prophylaxis: Lovenox Code Status: Full Family Communication: None at bedside Disposition Plan: SNF once bed available, difficult placement  Consultants: Neurology  Procedures:  Echo IMPRESSIONS    1. The left ventricle has normal systolic function with an ejection fraction of 60-65%. The cavity size was normal. Left ventricular diastolic Doppler parameters are consistent with impaired relaxation. No evidence of left ventricular regional wall  motion abnormalities.  2. The right ventricle has normal systolic function. The cavity was normal. There is no increase in right ventricular wall thickness. Right ventricular systolic pressure is normal with an estimated pressure of 21.0 mmHg.  3. The aortic valve is tricuspid. Moderate sclerosis of the aortic valve.  4. The aorta is normal unless otherwise noted.  Antimicrobials:  Anti-infectives (From admission, onward)   Start     Dose/Rate Route Frequency Ordered Stop   07/25/19 0745  cefTRIAXone (ROCEPHIN) 1 g in sodium chloride 0.9 % 100 mL IVPB     1 g 200 mL/hr over 30 Minutes Intravenous Every 24 hours 07/25/19 0734 07/29/19 1518       Subjective: Patient seen and examined at bedside.  Very poor historian.  No overnight fever, vomiting or agitation reported by nursing staff.  Patient hardly participates in any kind of physical therapy.  Hardly participates  in any conversation. Objective:  Vitals:   08/16/19 1600 08/16/19 2150 08/17/19 0045 08/17/19 0500  BP: 115/68 (!) 134/93 116/87 (!) 156/100  Pulse: 92 89 83 64  Resp: '16  18 18  ' Temp: 98.3 F (36.8 C) 98.2 F (36.8 C) (!) 97.4 F (36.3 C) (!) 97.4 F (36.3 C)  TempSrc: Oral  Axillary Axillary  SpO2: 99% 100% 100% 100%  Weight:    62 kg  Height:        Intake/Output Summary (Last 24 hours) at 08/17/2019 0738 Last data filed at 08/17/2019 0600 Gross per 24 hour  Intake 954 ml  Output 300 ml  Net 654 ml   Filed Weights   08/15/19 0522 08/16/19 0415 08/17/19 0500  Weight: 61.1 kg 62.2 kg 62 kg    Examination:  General exam:  No distress.    Hardly participates in any conversation  Respiratory system: Bilateral decreased breath sounds at bases, no wheezing cardiovascular system:  Rate controlled, S1-S2 heard gastrointestinal system: Abdomen is nondistended, soft and nontender. Normal bowel sounds heard. Extremities: No cyanosis, edema      Data Reviewed: I have personally reviewed following labs and imaging studies  CBC: Recent Labs  Lab 08/15/19 0620  WBC 6.6  NEUTROABS 3.4  HGB 11.3*  HCT 35.4*  MCV 96.5  PLT 062   Basic Metabolic Panel: Recent Labs  Lab 08/11/19 0325 08/12/19 0428 08/15/19 0620  NA 140 138 135  K 3.7 3.7 3.7  CL 107 104 102  CO2 '24 25 26  ' GLUCOSE 91 82 92  BUN 24* 19 25*  CREATININE 1.33* 1.27* 1.54*  CALCIUM 8.7* 9.1 9.6   GFR: Estimated Creatinine Clearance: 50.3 mL/min (A) (by C-G formula based on SCr of 1.54 mg/dL (H)). Liver Function Tests: Recent Labs  Lab 08/11/19 0325 08/12/19 0428 08/15/19 0620  AST 170* 173* 124*  ALT 157* 171* 147*  ALKPHOS 75 83 73  BILITOT 1.0 0.9 0.9  PROT 6.9 7.3 7.4  ALBUMIN 2.7* 3.1* 3.3*   No results for input(s): LIPASE, AMYLASE in the last 168 hours. No results for input(s): AMMONIA in the last 168 hours. Coagulation Profile: No results for input(s): INR, PROTIME in the last 168  hours. Cardiac Enzymes: No results for input(s): CKTOTAL, CKMB, CKMBINDEX, TROPONINI in the last 168 hours. BNP (last 3 results) No results for input(s): PROBNP in the last 8760 hours. HbA1C: No results for input(s): HGBA1C in the last 72 hours. CBG: Recent Labs  Lab 08/15/19 2212 08/16/19 0806 08/16/19 1201 08/16/19 1635 08/16/19 2142  GLUCAP 117* 80 114* 89 133*   Lipid Profile: No results for input(s): CHOL, HDL, LDLCALC, TRIG, CHOLHDL, LDLDIRECT in the last 72 hours. Thyroid Function Tests: No results for input(s): TSH, T4TOTAL, FREET4, T3FREE, THYROIDAB in the last 72 hours. Anemia Panel: No results for input(s): VITAMINB12, FOLATE, FERRITIN, TIBC, IRON, RETICCTPCT in the last 72 hours. Sepsis Labs: No results for input(s): PROCALCITON, LATICACIDVEN in the last 168 hours.  No results found for this or any previous visit (from the past 240 hour(s)).       Radiology Studies: No results found.      Scheduled Meds:   stroke: mapping our early stages of recovery book   Does not apply Once   aspirin  300 mg Rectal Daily   Or   aspirin  325 mg Oral Daily   chlorhexidine gluconate (MEDLINE KIT)  15 mL Mouth Rinse BID   enoxaparin (LOVENOX) injection  40 mg Subcutaneous Q24H   feeding supplement (ENSURE  ENLIVE)  237 mL Oral TID BM   folic acid  1 mg Oral Daily   insulin aspart  0-15 Units Subcutaneous TID WC   insulin aspart  0-5 Units Subcutaneous QHS   lactulose  20 g Oral BID   Or   lactulose  300 mL Rectal BID   mouth rinse  15 mL Mouth Rinse BID   metoprolol tartrate  25 mg Oral BID   multivitamin with minerals  1 tablet Oral Daily   thiamine  100 mg Oral Daily   Or   thiamine  100 mg Intravenous Daily   Continuous Infusions:    LOS: 25 days        Aline August, MD [facility] 08/17/2019, 7:38 AM

## 2019-08-17 NOTE — TOC Progression Note (Signed)
Transition of Care [facility]) - Progression Note    Patient Details  Name: Eero Dini MRN: [id number] Date of Birth: 06/30/1971  Transition of [facility] CM/SW [place], [place] Phone Number: 08/17/2019, 8:46 AM  Clinical Narrative:    Patient continued to have no SNF bed offers. CSW to continue to follow for potential bed offers.    Expected Discharge Plan: Skilled Nursing Facility Barriers to Discharge: SNF Pending Medicaid, SNF Pending payor source - LOG, Active Substance Use - Placement  Expected Discharge Plan and Services Expected Discharge Plan: [place]   Discharge Planning Services: CM Consult, Other - See comment(Pt will need LOG for SNF placement) Post Acute Care Choice: [place] arrangements for the past 2 months: Single Family Home                 DME Arranged: N/A           HH Agency: NA         Social Determinants of Health (SDOH) Interventions    Readmission Risk Interventions No flowsheet data found.

## 2019-08-18 ENCOUNTER — Encounter (HOSPITAL_COMMUNITY): Payer: Self-pay

## 2019-08-18 ENCOUNTER — Other Ambulatory Visit: Payer: Self-pay

## 2019-08-18 LAB — GLUCOSE, CAPILLARY
Glucose-Capillary: 79 mg/dL (ref 70–99)
Glucose-Capillary: 123 mg/dL — ABNORMAL HIGH (ref 70–99)
Glucose-Capillary: 116 mg/dL — ABNORMAL HIGH (ref 70–99)
Glucose-Capillary: 96 mg/dL (ref 70–99)

## 2019-08-18 NOTE — Progress Notes (Signed)
Patient ID: Tyler [name], male   DOB: 03/22/1971, 48 y.o.   MRN: [id number]  PROGRESS NOTE    Tyler [name]  [id number] DOB: 12-16-1970 DOA: 07/23/2019 PCP: Patient, No Pcp Per   Brief Narrative:  48 year old male with history of polysubstance abuse including heroin presented to [facility] ER after being found down with a syringe nearby.  He was given Narcan with minimal improvement.  Found in severe rhabdomyolysis with acute kidney injury and hyperkalemia.  He was started on IV fluids including bicarb drip and transferred to [facility] for further care.  Rhabdomyolysis improved but he had persistence of encephalopathy and was found to have acute bilateral cerebellar infarct.  Neurology was consulted.  Neurology has signed off.  PT recommends SNF placement.  Assessment & Plan:  Acute metabolic encephalopathy: Multifactorial -Still ongoing, not verbally responding to questions asked, sometimes refusing wound care, meals -Most likely secondary to drug use, uremia, heavy alcohol use -CT of the head and neck was initially negative for any traumatic finding -UDS, Tylenol and salicylate levels were negative. -TSH, folate, B12 levels all within normal limits -EEG: Generalized diffuse slowing but no seizure activity -Had required intermittent restraints initially during the hospitalization. -Fall precautions -Awaiting SNF placement.  Acute bilateral CVA, nonhemorrhagic -MRI of the brain showed acute CVA -Neurology evaluated the patient and has signed off.  MRA of the head negative.  Ultrasound carotid showed bilateral 1 to 39% ICA stenosis -Neurology recommends considering 30-day cardiac event monitoring as outpatient to rule out A. fib if patient neurologically improves -LDL 83; A1c 5.1. -Continue aspirin, will start statin once LFTs normalized. -Echocardiogram: EF of 60 to 65% -Lower extremity Dopplers: Negative for DVT - PT recommends SNF.  Severe rhabdomyolysis AKI -Improving,  creatinine peaked to 5.79 -Renal ultrasound negative -Monitor intermittently.  Off IV fluids  Transaminitis with elevated bilirubin -Improving -Right upper quadrant ultrasound showed hepatic steatosis with early cirrhosis with some gallbladder thickening -Monitor intermittently.  Alcohol abuse -Advised to quit -Continue thiamine, multivitamin and folic acid.  Urinary tract infection -Present on admission -Completed 5 days of Rocephin  Essential hypertension -Stable -Continue metoprolol  Severe malnutrition -Poor oral intake -Follow nutrition recommendations  DVT prophylaxis: Lovenox Code Status: Full Family Communication: None at bedside Disposition Plan: SNF once bed available, difficult placement  Consultants: Neurology  Procedures:  Echo IMPRESSIONS    1. The left ventricle has normal systolic function with an ejection fraction of 60-65%. The cavity size was normal. Left ventricular diastolic Doppler parameters are consistent with impaired relaxation. No evidence of left ventricular regional wall  motion abnormalities.  2. The right ventricle has normal systolic function. The cavity was normal. There is no increase in right ventricular wall thickness. Right ventricular systolic pressure is normal with an estimated pressure of 21.0 mmHg.  3. The aortic valve is tricuspid. Moderate sclerosis of the aortic valve.  4. The aorta is normal unless otherwise noted.  Antimicrobials:  Anti-infectives (From admission, onward)   Start     Dose/Rate Route Frequency Ordered Stop   07/25/19 0745  cefTRIAXone (ROCEPHIN) 1 g in sodium chloride 0.9 % 100 mL IVPB     1 g 200 mL/hr over 30 Minutes Intravenous Every 24 hours 07/25/19 0734 07/29/19 1518       Subjective: Patient seen and examined at bedside.  Very poor historian.  Hardly participates in any conversation.  No fever or vomiting reported. Objective: Vitals:   08/17/19 2356 08/18/19 0039 08/18/19 0500 08/18/19  0614  BP: 122/86 98/68  Marland Kitchen)  133/94  Pulse: 81 77  66  Resp: 18     Temp: 98.8 F (37.1 C) 98.9 F (37.2 C)  98.4 F (36.9 C)  TempSrc:  Oral  Oral  SpO2: 100% 100%  100%  Weight:   62.6 kg   Height:        Intake/Output Summary (Last 24 hours) at 08/18/2019 0744 Last data filed at 08/17/2019 2210 Gross per 24 hour  Intake 600 ml  Output 475 ml  Net 125 ml   Filed Weights   08/16/19 0415 08/17/19 0500 08/18/19 0500  Weight: 62.2 kg 62 kg 62.6 kg    Examination:  General exam:  No acute distress.  Hardly participates in any conversation  Respiratory system: Bilateral decreased breath sounds at bases  cardiovascular system:  Rate controlled, S1-S2 heard gastrointestinal system: Abdomen is nondistended, soft and nontender. Normal bowel sounds heard. Extremities: No cyanosis, edema      Data Reviewed: I have personally reviewed following labs and imaging studies  CBC: Recent Labs  Lab 08/15/19 0620  WBC 6.6  NEUTROABS 3.4  HGB 11.3*  HCT 35.4*  MCV 96.5  PLT 250   Basic Metabolic Panel: Recent Labs  Lab 08/12/19 0428 08/15/19 0620  NA 138 135  K 3.7 3.7  CL 104 102  CO2 25 26  GLUCOSE 82 92  BUN 19 25*  CREATININE 1.27* 1.54*  CALCIUM 9.1 9.6   GFR: Estimated Creatinine Clearance: 50.8 mL/min (A) (by C-G formula based on SCr of 1.54 mg/dL (H)). Liver Function Tests: Recent Labs  Lab 08/12/19 0428 08/15/19 0620  AST 173* 124*  ALT 171* 147*  ALKPHOS 83 73  BILITOT 0.9 0.9  PROT 7.3 7.4  ALBUMIN 3.1* 3.3*   No results for input(s): LIPASE, AMYLASE in the last 168 hours. No results for input(s): AMMONIA in the last 168 hours. Coagulation Profile: No results for input(s): INR, PROTIME in the last 168 hours. Cardiac Enzymes: No results for input(s): CKTOTAL, CKMB, CKMBINDEX, TROPONINI in the last 168 hours. BNP (last 3 results) No results for input(s): PROBNP in the last 8760 hours. HbA1C: No results for input(s): HGBA1C in the last 72 hours.  CBG: Recent Labs  Lab 08/16/19 2142 08/17/19 0809 08/17/19 1150 08/17/19 1644 08/17/19 1953  GLUCAP 133* 85 128* 123* 123*   Lipid Profile: No results for input(s): CHOL, HDL, LDLCALC, TRIG, CHOLHDL, LDLDIRECT in the last 72 hours. Thyroid Function Tests: No results for input(s): TSH, T4TOTAL, FREET4, T3FREE, THYROIDAB in the last 72 hours. Anemia Panel: No results for input(s): VITAMINB12, FOLATE, FERRITIN, TIBC, IRON, RETICCTPCT in the last 72 hours. Sepsis Labs: No results for input(s): PROCALCITON, LATICACIDVEN in the last 168 hours.  No results found for this or any previous visit (from the past 240 hour(s)).       Radiology Studies: No results found.      Scheduled Meds: .  stroke: mapping our early stages of recovery book   Does not apply Once  . aspirin  300 mg Rectal Daily   Or  . aspirin  325 mg Oral Daily  . chlorhexidine gluconate (MEDLINE KIT)  15 mL Mouth Rinse BID  . enoxaparin (LOVENOX) injection  40 mg Subcutaneous Q24H  . folic acid  1 mg Oral Daily  . insulin aspart  0-15 Units Subcutaneous TID WC  . insulin aspart  0-5 Units Subcutaneous QHS  . lactulose  20 g Oral BID   Or  . lactulose  300 mL Rectal BID  .  mouth rinse  15 mL Mouth Rinse BID  . metoprolol tartrate  25 mg Oral BID  . multivitamin with minerals  1 tablet Oral Daily  . Ensure Max Protein  237 mL Oral TID BM  . thiamine  100 mg Oral Daily   Or  . thiamine  100 mg Intravenous Daily   Continuous Infusions:    LOS: 26 days        Aline August, MD [facility] 08/18/2019, 7:44 AM

## 2019-08-18 NOTE — Progress Notes (Signed)
Occupational Therapy Treatment Patient Details Name: Tyler [name] MRN: [id number] DOB: [birth year] Today's Date: 08/18/2019    History of present illness Pt is a 48 y/o male transferred from [place] for unresponsiveness, likely secondary to substance abuse. Pt with rhabdomyolysis and AKI. EEG revealed moderate diffuse encephalopathy. PMH includes polysubstance abuse and HTN.    OT comments  Pt. Was initally agreeable to OOB and chair and alarm was set up. Then pt. Refused OOB. Pt.was lethargic and required extensive assit with washing hands and face. Pt. Was Max a with doffing and donning gown. Continue with treatment plan.   Follow Up Recommendations       Equipment Recommendations       Recommendations for Other Services      Precautions / Restrictions Precautions Precautions: Fall Precaution Comments: poor safety awareness       Mobility Bed Mobility                  Transfers                      Balance                                           ADL either performed or assessed with clinical judgement   ADL       Grooming: Wash/dry hands;Wash/dry face;Maximal assistance           Upper Body Dressing : Maximal assistance                   Functional mobility during ADLs: (Pt. initially agreed to sit in chair and then refused. ) General ADL Comments: Pt. performed ADLS bed level secondar;y to refusal to get out of bed.      Vision       Perception     Praxis      Cognition Arousal/Alertness: Lethargic Behavior During Therapy: Flat affect Overall Cognitive Status: No family/caregiver present to determine baseline cognitive functioning Area of Impairment: Orientation;Attention;Following commands                 Orientation Level: Place;Time;Situation Current Attention Level: Focused                    Exercises     Shoulder Instructions       General Comments       Pertinent Vitals/ Pain       Pain Assessment: No/denies pain  Home Living                                          Prior Functioning/Environment              Frequency           Progress Toward Goals  OT Goals(current goals can now be found in the care plan section)  Progress towards OT goals: Progressing toward goals  Acute Rehab OT Goals Patient Stated Goal: none stated  Plan Discharge plan remains appropriate;Frequency remains appropriate    Co-evaluation                 AM-PAC OT "6 Clicks" Daily Activity     Outcome Measure   Help from another person eating meals?: A Lot Help from another person  taking care of personal grooming?: A Lot Help from another person toileting, which includes using toliet, bedpan, or urinal?: Total Help from another person bathing (including washing, rinsing, drying)?: Total Help from another person to put on and taking off regular upper body clothing?: A Lot Help from another person to put on and taking off regular lower body clothing?: Total 6 Click Score: 9    End of Session        Activity Tolerance Patient limited by lethargy   Patient Left in bed;with call bell/phone within reach;with bed alarm set   Nurse Communication (ok treatment)        Time: 1020-1046 OT Time Calculation (min): 26 min  Charges: OT General Charges $OT Visit: 1 Visit OT Treatments $Self Care/Home Management : 23-37 mins  6 clicks   Tyler [name] 08/18/2019, 10:49 AM

## 2019-08-19 LAB — GLUCOSE, CAPILLARY
Glucose-Capillary: 70 mg/dL (ref 70–99)
Glucose-Capillary: 134 mg/dL — ABNORMAL HIGH (ref 70–99)
Glucose-Capillary: 116 mg/dL — ABNORMAL HIGH (ref 70–99)
Glucose-Capillary: 96 mg/dL (ref 70–99)
Glucose-Capillary: 119 mg/dL — ABNORMAL HIGH (ref 70–99)

## 2019-08-19 NOTE — Progress Notes (Signed)
Patient ID: Tyler [name], male   DOB: September 23, 1971, 48 y.o.   MRN: [id number]  PROGRESS NOTE    Tyler [name]  [id number] DOB: Apr 13, 1971 DOA: 07/23/2019 PCP: Patient, No Pcp Per   Brief Narrative:  48 year old male with history of polysubstance abuse including heroin presented to [facility] ER after being found down with a syringe nearby.  He was given Narcan with minimal improvement.  Found in severe rhabdomyolysis with acute kidney injury and hyperkalemia.  He was started on IV fluids including bicarb drip and transferred to [facility] for further care.  Rhabdomyolysis improved but he had persistence of encephalopathy and was found to have acute bilateral cerebellar infarct.  Neurology was consulted.  Neurology has signed off.  PT recommends SNF placement.  Assessment & Plan:  Acute metabolic encephalopathy: Multifactorial -Still ongoing, not verbally responding to questions asked, sometimes refusing wound care, meals -Most likely secondary to drug use, uremia, heavy alcohol use -CT of the head and neck was initially negative for any traumatic finding -UDS, Tylenol and salicylate levels were negative. -TSH, folate, B12 levels all within normal limits -EEG: Generalized diffuse slowing but no seizure activity -Had required intermittent restraints initially during the hospitalization. -Fall precautions -Awaiting SNF placement.  Acute bilateral CVA, nonhemorrhagic -MRI of the brain showed acute CVA -Neurology evaluated the patient and has signed off.  MRA of the head negative.  Ultrasound carotid showed bilateral 1 to 39% ICA stenosis -Neurology recommends considering 30-day cardiac event monitoring as outpatient to rule out A. fib if patient neurologically improves -LDL 83; A1c 5.1. -Continue aspirin, will start statin once LFTs normalized. -Echocardiogram: EF of 60 to 65% -Lower extremity Dopplers: Negative for DVT - PT recommends SNF.  Severe rhabdomyolysis AKI -Improving,  creatinine peaked to 5.79 -Renal ultrasound negative -Monitor intermittently.  Off IV fluids  Transaminitis with elevated bilirubin -Improving -Right upper quadrant ultrasound showed hepatic steatosis with early cirrhosis with some gallbladder thickening -Monitor intermittently.  Alcohol abuse -Advised to quit -Continue thiamine, multivitamin and folic acid.  Urinary tract infection -Present on admission -Completed 5 days of Rocephin  Essential hypertension -Stable -Continue metoprolol  Severe malnutrition -Poor oral intake -Follow nutrition recommendations  DVT prophylaxis: Lovenox Code Status: Full Family Communication: None at bedside Disposition Plan: SNF once bed available, difficult placement  Consultants: Neurology  Procedures:  Echo IMPRESSIONS    1. The left ventricle has normal systolic function with an ejection fraction of 60-65%. The cavity size was normal. Left ventricular diastolic Doppler parameters are consistent with impaired relaxation. No evidence of left ventricular regional wall  motion abnormalities.  2. The right ventricle has normal systolic function. The cavity was normal. There is no increase in right ventricular wall thickness. Right ventricular systolic pressure is normal with an estimated pressure of 21.0 mmHg.  3. The aortic valve is tricuspid. Moderate sclerosis of the aortic valve.  4. The aorta is normal unless otherwise noted.  Antimicrobials:  Anti-infectives (From admission, onward)   Start     Dose/Rate Route Frequency Ordered Stop   07/25/19 0745  cefTRIAXone (ROCEPHIN) 1 g in sodium chloride 0.9 % 100 mL IVPB     1 g 200 mL/hr over 30 Minutes Intravenous Every 24 hours 07/25/19 0734 07/29/19 1518       Subjective: Patient seen and examined at bedside.  Very poor historian.  No overnight fever or vomiting reported.   Objective: Vitals:   08/18/19 2059 08/19/19 0000 08/19/19 0400 08/19/19 0500  BP: 107/69 107/77 133/82  Pulse: 69     Resp: _0 Temp: 97.6 F (36.4 C) 97.8 F (36.6 C) 98.2 F (36.8 C)   TempSrc: Axillary Axillary Oral   SpO2: 100% 100% 98%   Weight:    62.6 kg  Height:        Intake/Output Summary (Last 24 hours) at 08/19/2019 0741 Last data filed at 08/19/2019 2683 Gross per 24 hour  Intake 1989 ml  Output 950 ml  Net 1039 ml   Filed Weights   08/17/19 0500 08/18/19 0500 08/19/19 0500  Weight: 62 kg 62.6 kg 62.6 kg    Examination:  General exam:  No distress.  Wakes up slightly, hardly participates in any conversation  Respiratory system: Bilateral decreased breath sounds at bases, no wheezing  cardiovascular system:  Rate controlled, S1-S2 heard gastrointestinal system: Abdomen is nondistended, soft and nontender. Normal bowel sounds heard. Extremities: No cyanosis, edema      Data Reviewed: I have personally reviewed following labs and imaging studies  CBC: Recent Labs  Lab 08/15/19 0620  WBC 6.6  NEUTROABS 3.4  HGB 11.3*  HCT 35.4*  MCV 96.5  PLT 419   Basic Metabolic Panel: Recent Labs  Lab 08/15/19 0620  NA 135  K 3.7  CL 102  CO2 26  GLUCOSE 92  BUN 25*  CREATININE 1.54*  CALCIUM 9.6   GFR: Estimated Creatinine Clearance: 50.8 mL/min (A) (by C-G formula based on SCr of 1.54 mg/dL (H)). Liver Function Tests: Recent Labs  Lab 08/15/19 0620  AST 124*  ALT 147*  ALKPHOS 73  BILITOT 0.9  PROT 7.4  ALBUMIN 3.3*   No results for input(s): LIPASE, AMYLASE in the last 168 hours. No results for input(s): AMMONIA in the last 168 hours. Coagulation Profile: No results for input(s): INR, PROTIME in the last 168 hours. Cardiac Enzymes: No results for input(s): CKTOTAL, CKMB, CKMBINDEX, TROPONINI in the last 168 hours. BNP (last 3 results) No results for input(s): PROBNP in the last 8760 hours. HbA1C: No results for input(s): HGBA1C in the last 72 hours. CBG: Recent Labs  Lab 08/17/19 1953 08/18/19 0825 08/18/19 1218 08/18/19 1652  08/18/19 2055  GLUCAP 123* 79 123* 116* 96   Lipid Profile: No results for input(s): CHOL, HDL, LDLCALC, TRIG, CHOLHDL, LDLDIRECT in the last 72 hours. Thyroid Function Tests: No results for input(s): TSH, T4TOTAL, FREET4, T3FREE, THYROIDAB in the last 72 hours. Anemia Panel: No results for input(s): VITAMINB12, FOLATE, FERRITIN, TIBC, IRON, RETICCTPCT in the last 72 hours. Sepsis Labs: No results for input(s): PROCALCITON, LATICACIDVEN in the last 168 hours.  No results found for this or any previous visit (from the past 240 hour(s)).       Radiology Studies: No results found.      Scheduled Meds: .  stroke: mapping our early stages of recovery book   Does not apply Once  . aspirin  300 mg Rectal Daily   Or  . aspirin  325 mg Oral Daily  . chlorhexidine gluconate (MEDLINE KIT)  15 mL Mouth Rinse BID  . enoxaparin (LOVENOX) injection  40 mg Subcutaneous Q24H  . folic acid  1 mg Oral Daily  . insulin aspart  0-15 Units Subcutaneous TID WC  . insulin aspart  0-5 Units Subcutaneous QHS  . lactulose  20 g Oral BID   Or  . lactulose  300 mL Rectal BID  . mouth rinse  15 mL Mouth Rinse BID  . metoprolol tartrate  25 mg  Oral BID  . multivitamin with minerals  1 tablet Oral Daily  . Ensure Max Protein  237 mL Oral TID BM  . thiamine  100 mg Oral Daily   Or  . thiamine  100 mg Intravenous Daily   Continuous Infusions:    LOS: 27 days        Aline August, MD [facility] 08/19/2019, 7:41 AM

## 2019-08-20 LAB — GLUCOSE, CAPILLARY
Glucose-Capillary: 74 mg/dL (ref 70–99)
Glucose-Capillary: 124 mg/dL — ABNORMAL HIGH (ref 70–99)
Glucose-Capillary: 87 mg/dL (ref 70–99)

## 2019-08-20 LAB — COMPREHENSIVE METABOLIC PANEL
ALT: 151 U/L — ABNORMAL HIGH (ref 0–44)
AST: 123 U/L — ABNORMAL HIGH (ref 15–41)
Albumin: 3.4 g/dL — ABNORMAL LOW (ref 3.5–5.0)
Alkaline Phosphatase: 71 U/L (ref 38–126)
Anion gap: 10 (ref 5–15)
BUN: 36 mg/dL — ABNORMAL HIGH (ref 6–20)
CO2: 24 mmol/L (ref 22–32)
Calcium: 9.7 mg/dL (ref 8.9–10.3)
Chloride: 103 mmol/L (ref 98–111)
Creatinine, Ser: 1.16 mg/dL (ref 0.61–1.24)
GFR calc Af Amer: >60 mL/min (ref >60)
GFR calc non Af Amer: >60 mL/min (ref >60)
Glucose, Bld: 87 mg/dL (ref 70–99)
Potassium: 4.1 mmol/L (ref 3.5–5.1)
Sodium: 137 mmol/L (ref 135–145)
Total Bilirubin: 0.7 mg/dL (ref 0.3–1.2)
Total Protein: 7.8 g/dL (ref 6.5–8.1)

## 2019-08-20 LAB — CBC WITH DIFFERENTIAL/PLATELET
Abs Immature Granulocytes: 0.02 10*3/uL (ref 0.00–0.07)
Basophils Absolute: 0 10*3/uL (ref 0.0–0.1)
Basophils Relative: 1 %
Eosinophils Absolute: 0.3 10*3/uL (ref 0.0–0.5)
Eosinophils Relative: 4 %
HCT: 34.8 % — ABNORMAL LOW (ref 39.0–52.0)
Hemoglobin: 11.2 g/dL — ABNORMAL LOW (ref 13.0–17.0)
Immature Granulocytes: 0 %
Lymphocytes Relative: 34 %
Lymphs Abs: 2.1 10*3/uL (ref 0.7–4.0)
MCH: 31.3 pg (ref 26.0–34.0)
MCHC: 32.2 g/dL (ref 30.0–36.0)
MCV: 97.2 fL (ref 80.0–100.0)
Monocytes Absolute: 0.7 10*3/uL (ref 0.1–1.0)
Monocytes Relative: 11 %
Neutro Abs: 3.2 10*3/uL (ref 1.7–7.7)
Neutrophils Relative %: 50 %
Platelets: 251 10*3/uL (ref 150–400)
RBC: 3.58 MIL/uL — ABNORMAL LOW (ref 4.22–5.81)
RDW: 16.3 % — ABNORMAL HIGH (ref 11.5–15.5)
WBC: 6.3 10*3/uL (ref 4.0–10.5)
nRBC: 0 % (ref 0.0–0.2)

## 2019-08-20 LAB — MAGNESIUM: Magnesium: 2 mg/dL (ref 1.7–2.4)

## 2019-08-20 NOTE — Progress Notes (Signed)
Patient ID: Tyler [name], male   DOB: 27-Jan-1971, 48 y.o.   MRN: [id number]  PROGRESS NOTE    Tyler [name]  IZT:[id number] DOB: [birth year] DOA: 07/23/2019 PCP: Patient, No Pcp Per   Brief Narrative:  48 year old male with history of polysubstance abuse including heroin presented to [facility] ER after being found down with a syringe nearby.  He was given Narcan with minimal improvement.  Found in severe rhabdomyolysis with acute kidney injury and hyperkalemia.  He was started on IV fluids including bicarb drip and transferred to [facility] for further care.  Rhabdomyolysis improved but he had persistence of encephalopathy and was found to have acute bilateral cerebellar infarct.  Neurology was consulted.  Neurology has signed off.  PT recommends SNF placement.  Assessment & Plan:  Acute metabolic encephalopathy: Multifactorial -Still ongoing, not verbally responding to questions asked, sometimes refusing wound care, meals -Most likely secondary to drug use, uremia, heavy alcohol use -CT of the head and neck was initially negative for any traumatic finding -UDS, Tylenol and salicylate levels were negative. -TSH, folate, B12 levels all within normal limits -EEG: Generalized diffuse slowing but no seizure activity -Had required intermittent restraints initially during the hospitalization. -Fall precautions -Awaiting SNF placement.  Acute bilateral CVA, nonhemorrhagic -MRI of the brain showed acute CVA -Neurology evaluated the patient and has signed off.  MRA of the head negative.  Ultrasound carotid showed bilateral 1 to 39% ICA stenosis -Neurology recommends considering 30-day cardiac event monitoring as outpatient to rule out A. fib if patient neurologically improves -LDL 83; A1c 5.1. -Continue aspirin, will start statin once LFTs normalized. -Echocardiogram: EF of 60 to 65% -Lower extremity Dopplers: Negative for DVT - PT recommends SNF.  Severe rhabdomyolysis AKI -Improved.   Creatinine 1.16 today. -Renal ultrasound negative -Monitor intermittently.  Off IV fluids  Transaminitis with elevated bilirubin -Improving -Right upper quadrant ultrasound showed hepatic steatosis with early cirrhosis with some gallbladder thickening -Monitor intermittently.  Alcohol abuse -Advised to quit -Continue thiamine, multivitamin and folic acid.  Urinary tract infection -Present on admission -Completed 5 days of Rocephin  Essential hypertension -Stable -Continue metoprolol  Severe malnutrition -Poor oral intake -Follow nutrition recommendations  DVT prophylaxis: Lovenox Code Status: Full Family Communication: None at bedside Disposition Plan: SNF once bed available, difficult placement  Consultants: Neurology  Procedures:  Echo IMPRESSIONS    1. The left ventricle has normal systolic function with an ejection fraction of 60-65%. The cavity size was normal. Left ventricular diastolic Doppler parameters are consistent with impaired relaxation. No evidence of left ventricular regional wall  motion abnormalities.  2. The right ventricle has normal systolic function. The cavity was normal. There is no increase in right ventricular wall thickness. Right ventricular systolic pressure is normal with an estimated pressure of 21.0 mmHg.  3. The aortic valve is tricuspid. Moderate sclerosis of the aortic valve.  4. The aorta is normal unless otherwise noted.  Antimicrobials:  Anti-infectives (From admission, onward)   Start     Dose/Rate Route Frequency Ordered Stop   07/25/19 0745  cefTRIAXone (ROCEPHIN) 1 g in sodium chloride 0.9 % 100 mL IVPB     1 g 200 mL/hr over 30 Minutes Intravenous Every 24 hours 07/25/19 0734 07/29/19 1518       Subjective: Patient seen and examined at bedside.  Very poor historian.  No overnight fever or vomiting or seizure-like activity reported per nursing staff.   Objective: Vitals:   08/19/19 2233 08/20/19 0100 08/20/19 0430  08/20/19 0500  BP: 120/87 117/81 131/86   Pulse: 83 72 81   Resp:  19 17   Temp:  98.2 F (36.8 C) (!) 97.3 F (36.3 C)   TempSrc:   Axillary   SpO2:  99% 100%   Weight:    62.8 kg  Height:        Intake/Output Summary (Last 24 hours) at 08/20/2019 0730 Last data filed at 08/20/2019 0038 Gross per 24 hour  Intake 1080 ml  Output 1900 ml  Net -820 ml   Filed Weights   08/18/19 0500 08/19/19 0500 08/20/19 0500  Weight: 62.6 kg 62.6 kg 62.8 kg    Examination:  General exam:  No acute distress.  Does not communicate much.  Respiratory system: Bilateral decreased breath sounds at bases, no wheezing  cardiovascular system:  S1-S2 heard, rate controlled gastrointestinal system: Abdomen is nondistended, soft and nontender. Normal bowel sounds heard. Extremities: No cyanosis, edema      Data Reviewed: I have personally reviewed following labs and imaging studies  CBC: Recent Labs  Lab 08/15/19 0620 08/20/19 0538  WBC 6.6 6.3  NEUTROABS 3.4 3.2  HGB 11.3* 11.2*  HCT 35.4* 34.8*  MCV 96.5 97.2  PLT 282 295   Basic Metabolic Panel: Recent Labs  Lab 08/15/19 0620 08/20/19 0538  NA 135 137  K 3.7 4.1  CL 102 103  CO2 26 24  GLUCOSE 92 87  BUN 25* 36*  CREATININE 1.54* 1.16  CALCIUM 9.6 9.7  MG  --  2.0   GFR: Estimated Creatinine Clearance: 67.7 mL/min (by C-G formula based on SCr of 1.16 mg/dL). Liver Function Tests: Recent Labs  Lab 08/15/19 0620 08/20/19 0538  AST 124* 123*  ALT 147* 151*  ALKPHOS 73 71  BILITOT 0.9 0.7  PROT 7.4 7.8  ALBUMIN 3.3* 3.4*   No results for input(s): LIPASE, AMYLASE in the last 168 hours. No results for input(s): AMMONIA in the last 168 hours. Coagulation Profile: No results for input(s): INR, PROTIME in the last 168 hours. Cardiac Enzymes: No results for input(s): CKTOTAL, CKMB, CKMBINDEX, TROPONINI in the last 168 hours. BNP (last 3 results) No results for input(s): PROBNP in the last 8760 hours. HbA1C: No  results for input(s): HGBA1C in the last 72 hours. CBG: Recent Labs  Lab 08/19/19 0746 08/19/19 1129 08/19/19 1144 08/19/19 1715 08/19/19 2150  GLUCAP 70 134* 116* 96 119*   Lipid Profile: No results for input(s): CHOL, HDL, LDLCALC, TRIG, CHOLHDL, LDLDIRECT in the last 72 hours. Thyroid Function Tests: No results for input(s): TSH, T4TOTAL, FREET4, T3FREE, THYROIDAB in the last 72 hours. Anemia Panel: No results for input(s): VITAMINB12, FOLATE, FERRITIN, TIBC, IRON, RETICCTPCT in the last 72 hours. Sepsis Labs: No results for input(s): PROCALCITON, LATICACIDVEN in the last 168 hours.  No results found for this or any previous visit (from the past 240 hour(s)).       Radiology Studies: No results found.      Scheduled Meds: .  stroke: mapping our early stages of recovery book   Does not apply Once  . aspirin  300 mg Rectal Daily   Or  . aspirin  325 mg Oral Daily  . chlorhexidine gluconate (MEDLINE KIT)  15 mL Mouth Rinse BID  . enoxaparin (LOVENOX) injection  40 mg Subcutaneous Q24H  . folic acid  1 mg Oral Daily  . insulin aspart  0-15 Units Subcutaneous TID WC  . insulin aspart  0-5 Units Subcutaneous QHS  . lactulose  20  g Oral BID   Or  . lactulose  300 mL Rectal BID  . mouth rinse  15 mL Mouth Rinse BID  . metoprolol tartrate  25 mg Oral BID  . multivitamin with minerals  1 tablet Oral Daily  . Ensure Max Protein  237 mL Oral TID BM  . thiamine  100 mg Oral Daily   Or  . thiamine  100 mg Intravenous Daily   Continuous Infusions:    LOS: 28 days        Aline August, MD [facility] 08/20/2019, 7:30 AM

## 2019-08-20 NOTE — Progress Notes (Signed)
Physical Therapy Treatment Patient Details Name: Tyler [name] Hunnell MRN: [id number] DOB: 02/07/1971 Today's Date: 08/20/2019    History of Present Illness Pt is a 48 y/o male transferred from [place] hospital for unresponsiveness, likely secondary to substance abuse. Pt with rhabdomyolysis and AKI. EEG revealed moderate diffuse encephalopathy. PMH includes polysubstance abuse and HTN.     PT Comments    Pt with improved sitting balance today including while taking meds from nurse.  He was lethargic and he fatigued quickly, but sat 9 mins with min to min/guard.  He was unable to scoot at EOB and when attempted returned supine and almost immediately fell asleep. Con't to recommend SNF.   Follow Up Recommendations  SNF;Supervision for mobility/OOB     Equipment Recommendations  None recommended by PT;Other (comment)(defer to next venue)    Recommendations for Other Services       Precautions / Restrictions Precautions Precautions: Fall Precaution Comments: poor safety awareness Restrictions Weight Bearing Restrictions: No    Mobility  Bed Mobility Overal bed mobility: Needs Assistance Bed Mobility: Supine to Sit     Supine to sit: Min assist Sit to supine: Min assist   General bed mobility comments: Pt came to EOB on L side of bed with MOD A and used of bed pad to get hips fully turned.  Attempted scooting at EOB, but he could not follow instructions for this and laid trunk down.  Transfers                 General transfer comment: unable, too lethargic  Ambulation/Gait                 Stairs             Wheelchair Mobility    Modified Rankin (Stroke Patients Only) Modified Rankin (Stroke Patients Only) Pre-Morbid Rankin Score: No symptoms Modified Rankin: Moderately severe disability     Balance   Sitting-balance support: Feet supported;Single extremity supported;Bilateral upper extremity supported Sitting balance-Leahy Scale: Poor Sitting  balance - Comments: Pt took medicine while sitting and required MIN suppport at this time. Sitting balance fluctuated from fair to poor (min/guard to min)                                    Cognition Arousal/Alertness: Lethargic Behavior During Therapy: Flat affect Overall Cognitive Status: No family/caregiver present to determine baseline cognitive functioning Area of Impairment: Orientation;Attention;Following commands                       Following Commands: Follows one step commands inconsistently Safety/Judgement: Decreased awareness of safety;Decreased awareness of deficits Awareness: Intellectual Problem Solving: Slow processing;Decreased initiation;Difficulty sequencing;Requires verbal cues;Requires tactile cues        Exercises      General Comments        Pertinent Vitals/Pain Pain Assessment: Faces Faces Pain Scale: No hurt    Home Living                      Prior Function            PT Goals (current goals can now be found in the care plan section) Acute Rehab PT Goals Patient Stated Goal: none stated Time For Goal Achievement: 08/23/19 Potential to Achieve Goals: Fair Progress towards PT goals: Progressing toward goals    Frequency    Min 2X/week  PT Plan Current plan remains appropriate    Co-evaluation              AM-PAC PT "6 Clicks" Mobility   Outcome Measure  Help needed turning from your back to your side while in a flat bed without using bedrails?: None Help needed moving from lying on your back to sitting on the side of a flat bed without using bedrails?: A Lot Help needed moving to and from a bed to a chair (including a wheelchair)?: Total Help needed standing up from a chair using your arms (e.g., wheelchair or bedside chair)?: Total Help needed to walk in hospital room?: Total Help needed climbing 3-5 steps with a railing? : Total 6 Click Score: 10    End of Session   Activity  Tolerance: Patient limited by lethargy Patient left: in bed;with bed alarm set;with call bell/phone within reach Nurse Communication: Mobility status PT Visit Diagnosis: Muscle weakness (generalized) (M62.81);Difficulty in walking, not elsewhere classified (R26.2);Other abnormalities of gait and mobility (R26.89)     Time: 5597-4163 PT Time Calculation (min) (ACUTE ONLY): 14 min  Charges:  $Therapeutic Activity: 8-22 mins                     Yovani Cogburn L. Tamala Julian, [place] Pager [phone] 08/20/2019    Galen Manila 08/20/2019, 11:35 AM

## 2019-08-21 LAB — GLUCOSE, CAPILLARY
Glucose-Capillary: 78 mg/dL (ref 70–99)
Glucose-Capillary: 100 mg/dL — ABNORMAL HIGH (ref 70–99)
Glucose-Capillary: 76 mg/dL (ref 70–99)
Glucose-Capillary: 99 mg/dL (ref 70–99)

## 2019-08-21 MED ORDER — ENSURE ENLIVE PO LIQD
237.0000 mL | Freq: Two times a day (BID) | ORAL | Status: DC
  Administered 2019-08-21 – 2019-12-01 (×158): 237 mL via ORAL

## 2019-08-21 NOTE — Plan of Care (Signed)
  Problem: Clinical Measurements: Goal: Ability to maintain clinical measurements within normal limits will improve Outcome: Progressing   Problem: Nutrition: Goal: Adequate nutrition will be maintained Outcome: Progressing   Problem: Health Behavior/Discharge Planning: Goal: Ability to manage health-related needs will improve Outcome: Progressing   Problem: Nutrition: Goal: Risk of aspiration will decrease Outcome: Progressing Goal: Dietary intake will improve Outcome: Progressing   Problem: Ischemic Stroke/TIA Tissue Perfusion: Goal: Complications of ischemic stroke/TIA will be minimized Outcome: Progressing   Problem: Clinical Measurements: Goal: Ability to maintain clinical measurements within normal limits will improve Outcome: Progressing Goal: Will remain free from infection Outcome: Progressing Goal: Diagnostic test results will improve Outcome: Progressing   Problem: Nutrition: Goal: Adequate nutrition will be maintained Outcome: Progressing

## 2019-08-21 NOTE — Progress Notes (Signed)
Patient ID: Tyler [name], male   DOB: [birth year], 48 y.o.   MRN: [id number]  PROGRESS NOTE    Tyler [name]  ZMO:[id number] DOB: Jun 04, 1971 DOA: 07/23/2019 PCP: Patient, No Pcp Per   Brief Narrative:  48 year old male with history of polysubstance abuse including heroin presented to [facility] ER after being found down with a syringe nearby.  He was given Narcan with minimal improvement.  Found in severe rhabdomyolysis with acute kidney injury and hyperkalemia.  He was started on IV fluids including bicarb drip and transferred to [facility] for further care.  Rhabdomyolysis improved but he had persistence of encephalopathy and was found to have acute bilateral cerebellar infarct.  Neurology was consulted.  Neurology has signed off.  PT recommends SNF placement.  Assessment & Plan:  Acute metabolic encephalopathy: Multifactorial -Still ongoing, not verbally responding to questions asked, sometimes refusing wound care, meals -Most likely secondary to drug use, uremia, heavy alcohol use -CT of the head and neck was initially negative for any traumatic finding -UDS, Tylenol and salicylate levels were negative. -TSH, folate, B12 levels all within normal limits -EEG: Generalized diffuse slowing but no seizure activity -Had required intermittent restraints initially during the hospitalization. -Fall precautions -Awaiting SNF placement.  Acute bilateral CVA, nonhemorrhagic -MRI of the brain showed acute CVA -Neurology evaluated the patient and has signed off.  MRA of the head negative.  Ultrasound carotid showed bilateral 1 to 39% ICA stenosis -Neurology recommends considering 30-day cardiac event monitoring as outpatient to rule out A. fib if patient neurologically improves -LDL 83; A1c 5.1. -Continue aspirin, will start statin once LFTs normalized. -Echocardiogram: EF of 60 to 65% -Lower extremity Dopplers: Negative for DVT - PT recommends SNF.  Severe rhabdomyolysis AKI -Improved.   Creatinine 1.16 today. -Renal ultrasound negative -Monitor intermittently.  Off IV fluids  Transaminitis with elevated bilirubin -Improving -Right upper quadrant ultrasound showed hepatic steatosis with early cirrhosis with some gallbladder thickening -Monitor intermittently.  Alcohol abuse -Advised to quit -Continue thiamine, multivitamin and folic acid.  Urinary tract infection -Present on admission -Completed 5 days of Rocephin  Essential hypertension -Stable -Continue metoprolol  Severe malnutrition -Poor oral intake -Follow nutrition recommendations  DVT prophylaxis: Lovenox Code Status: Full Family Communication: None at bedside Disposition Plan: SNF once bed available, difficult placement  Consultants: Neurology  Procedures:  Echo IMPRESSIONS    1. The left ventricle has normal systolic function with an ejection fraction of 60-65%. The cavity size was normal. Left ventricular diastolic Doppler parameters are consistent with impaired relaxation. No evidence of left ventricular regional wall  motion abnormalities.  2. The right ventricle has normal systolic function. The cavity was normal. There is no increase in right ventricular wall thickness. Right ventricular systolic pressure is normal with an estimated pressure of 21.0 mmHg.  3. The aortic valve is tricuspid. Moderate sclerosis of the aortic valve.  4. The aorta is normal unless otherwise noted.  Antimicrobials:  Anti-infectives (From admission, onward)   Start     Dose/Rate Route Frequency Ordered Stop   07/25/19 0745  cefTRIAXone (ROCEPHIN) 1 g in sodium chloride 0.9 % 100 mL IVPB     1 g 200 mL/hr over 30 Minutes Intravenous Every 24 hours 07/25/19 0734 07/29/19 1518       Subjective: Patient seen and examined at bedside.  Very poor historian.  Patient hardly participates in any conversation.  No overnight fever or vomiting reported.  Objective: Vitals:   08/20/19 1212 08/20/19 2108 08/21/19  0500 08/21/19 0354  BP: 115/79 (!) 134/96  107/82  Pulse: 84 83  69  Resp: 16 16    Temp: 98.5 F (36.9 C) 98.2 F (36.8 C)  97.7 F (36.5 C)  TempSrc:  Axillary  Oral  SpO2: 99% 100%  99%  Weight:   62.2 kg   Height:        Intake/Output Summary (Last 24 hours) at 08/21/2019 0742 Last data filed at 08/20/2019 1027 Gross per 24 hour  Intake 830 ml  Output -  Net 830 ml   Filed Weights   08/19/19 0500 08/20/19 0500 08/21/19 0500  Weight: 62.6 kg 62.8 kg 62.2 kg    Examination:  General exam:  No distress.  Does not communicate much.  Respiratory system: Bilateral decreased breath sounds at bases  cardiovascular system:  S1-S2 heard, rate controlled gastrointestinal system: Abdomen is nondistended, soft and nontender. Normal bowel sounds heard. Extremities: No cyanosis, edema      Data Reviewed: I have personally reviewed following labs and imaging studies  CBC: Recent Labs  Lab 08/15/19 0620 08/20/19 0538  WBC 6.6 6.3  NEUTROABS 3.4 3.2  HGB 11.3* 11.2*  HCT 35.4* 34.8*  MCV 96.5 97.2  PLT 282 124   Basic Metabolic Panel: Recent Labs  Lab 08/15/19 0620 08/20/19 0538  NA 135 137  K 3.7 4.1  CL 102 103  CO2 26 24  GLUCOSE 92 87  BUN 25* 36*  CREATININE 1.54* 1.16  CALCIUM 9.6 9.7  MG  --  2.0   GFR: Estimated Creatinine Clearance: 67 mL/min (by C-G formula based on SCr of 1.16 mg/dL). Liver Function Tests: Recent Labs  Lab 08/15/19 0620 08/20/19 0538  AST 124* 123*  ALT 147* 151*  ALKPHOS 73 71  BILITOT 0.9 0.7  PROT 7.4 7.8  ALBUMIN 3.3* 3.4*   No results for input(s): LIPASE, AMYLASE in the last 168 hours. No results for input(s): AMMONIA in the last 168 hours. Coagulation Profile: No results for input(s): INR, PROTIME in the last 168 hours. Cardiac Enzymes: No results for input(s): CKTOTAL, CKMB, CKMBINDEX, TROPONINI in the last 168 hours. BNP (last 3 results) No results for input(s): PROBNP in the last 8760 hours. HbA1C: No  results for input(s): HGBA1C in the last 72 hours. CBG: Recent Labs  Lab 08/19/19 1715 08/19/19 2150 08/20/19 0821 08/20/19 1212 08/20/19 2155  GLUCAP 96 119* 74 124* 87   Lipid Profile: No results for input(s): CHOL, HDL, LDLCALC, TRIG, CHOLHDL, LDLDIRECT in the last 72 hours. Thyroid Function Tests: No results for input(s): TSH, T4TOTAL, FREET4, T3FREE, THYROIDAB in the last 72 hours. Anemia Panel: No results for input(s): VITAMINB12, FOLATE, FERRITIN, TIBC, IRON, RETICCTPCT in the last 72 hours. Sepsis Labs: No results for input(s): PROCALCITON, LATICACIDVEN in the last 168 hours.  No results found for this or any previous visit (from the past 240 hour(s)).       Radiology Studies: No results found.      Scheduled Meds: .  stroke: mapping our early stages of recovery book   Does not apply Once  . aspirin  300 mg Rectal Daily   Or  . aspirin  325 mg Oral Daily  . chlorhexidine gluconate (MEDLINE KIT)  15 mL Mouth Rinse BID  . enoxaparin (LOVENOX) injection  40 mg Subcutaneous Q24H  . folic acid  1 mg Oral Daily  . insulin aspart  0-15 Units Subcutaneous TID WC  . insulin aspart  0-5 Units Subcutaneous QHS  . lactulose  20 g  Oral BID   Or  . lactulose  300 mL Rectal BID  . mouth rinse  15 mL Mouth Rinse BID  . metoprolol tartrate  25 mg Oral BID  . multivitamin with minerals  1 tablet Oral Daily  . Ensure Max Protein  237 mL Oral TID BM  . thiamine  100 mg Oral Daily   Or  . thiamine  100 mg Intravenous Daily   Continuous Infusions:    LOS: 29 days        Aline August, MD [facility] 08/21/2019, 7:42 AM

## 2019-08-21 NOTE — Progress Notes (Signed)
Nutrition Follow-up  DOCUMENTATION CODES:   Severe malnutrition in context of acute illness/injury  INTERVENTION:   -Continue MVI with minerals daily -Continue Magic cup TID with meals, each supplement provides 290 kcal and 9 grams of protein -D/c Hormel shake -D/c Ensure Max -Ensure Enlive po BID, each supplement provides 350 kcal and 20 grams of protein  NUTRITION DIAGNOSIS:   Severe Malnutrition related to acute illness(acute toxic/metabolic encephalopathy) as evidenced by moderate fat depletion, severe fat depletion, moderate muscle depletion, severe muscle depletion, energy intake < or equal to 50% for > or equal to 5 days, percent weight loss.  Ongoing  GOAL:   Patient will meet greater than or equal to 90% of their needs  Progressing   MONITOR:   PO intake, Supplement acceptance, Labs, Weight trends, Skin, I & O's  REASON FOR ASSESSMENT:   LOS    ASSESSMENT:   Tyler [name] is a 48 y.o. male with medical history significant of polysubstance abuse including heroin.  Brought in to [facility] ED just before MN after being found down by friend last evening with nearby syringe.  Reviewed I/O's: +830 ml x 24 hours and -1.8 L since 08/07/19  Pt sleeping soundly in bed. He did not respond to his name being called.   Intake has improved greatly since last visit. Noted meal completion 70-100%. Observed breakfast tray- pt consumed 100% of meal.   Reviewed wts; wt has been stable over the past week.   Per MD notes, pt is medically stable for discharge. Pt is difficult to place and CSW is working on SNF placement.   Medications reviewed and include lactulose, folic acid, and thiamine.   Labs reviewed: CBGS: 74-124 (inpatient orders for glycemic control are 0-15 units insulin aspart TID with meals and 0-5 units insulin aspart q HS).   Diet Order:   Diet Order            DIET DYS 3 Room service appropriate? No; Fluid consistency: Thin  Diet effective now               EDUCATION NEEDS:   Not appropriate for education at this time  Skin:  Skin Assessment: Reviewed RN Assessment  Last BM:  08/21/19  Height:   Ht Readings from Last 1 Encounters:  08/09/19 5\' 8"  (1.727 m)    Weight:   Wt Readings from Last 1 Encounters:  08/21/19 62.2 kg    Ideal Body Weight:  70 kg  BMI:  Body mass index is 20.85 kg/m.  Estimated Nutritional Needs:   Kcal:  2000-2200  Protein:  105-120 grams  Fluid:  > 2 L    Sherron Mummert A. Jimmye Norman, RD, LDN, [place] Registered Dietitian II Certified Diabetes Care and Education Specialist Pager: [phone] After hours Pager: [phone]

## 2019-08-22 DIAGNOSIS — E43 Unspecified severe protein-calorie malnutrition: Secondary | ICD-10-CM

## 2019-08-22 LAB — CBC WITH DIFFERENTIAL/PLATELET
Abs Immature Granulocytes: 0.03 10*3/uL (ref 0.00–0.07)
Basophils Absolute: 0 10*3/uL (ref 0.0–0.1)
Basophils Relative: 0 %
Eosinophils Absolute: 0.3 10*3/uL (ref 0.0–0.5)
Eosinophils Relative: 4 %
HCT: 38.5 % — ABNORMAL LOW (ref 39.0–52.0)
Hemoglobin: 13.2 g/dL (ref 13.0–17.0)
Immature Granulocytes: 0 %
Lymphocytes Relative: 30 %
Lymphs Abs: 2.1 10*3/uL (ref 0.7–4.0)
MCH: 32.7 pg (ref 26.0–34.0)
MCHC: 34.3 g/dL (ref 30.0–36.0)
MCV: 95.3 fL (ref 80.0–100.0)
Monocytes Absolute: 0.8 10*3/uL (ref 0.1–1.0)
Monocytes Relative: 11 %
Neutro Abs: 3.7 10*3/uL (ref 1.7–7.7)
Neutrophils Relative %: 55 %
Platelets: 296 10*3/uL (ref 150–400)
RBC: 4.04 MIL/uL — ABNORMAL LOW (ref 4.22–5.81)
RDW: 16.1 % — ABNORMAL HIGH (ref 11.5–15.5)
WBC: 6.9 10*3/uL (ref 4.0–10.5)
nRBC: 0 % (ref 0.0–0.2)

## 2019-08-22 LAB — COMPREHENSIVE METABOLIC PANEL
ALT: 146 U/L — ABNORMAL HIGH (ref 0–44)
AST: 112 U/L — ABNORMAL HIGH (ref 15–41)
Albumin: 3.7 g/dL (ref 3.5–5.0)
Alkaline Phosphatase: 75 U/L (ref 38–126)
Anion gap: 10 (ref 5–15)
BUN: 27 mg/dL — ABNORMAL HIGH (ref 6–20)
CO2: 25 mmol/L (ref 22–32)
Calcium: 9.7 mg/dL (ref 8.9–10.3)
Chloride: 104 mmol/L (ref 98–111)
Creatinine, Ser: 1.21 mg/dL (ref 0.61–1.24)
GFR calc Af Amer: >60 mL/min (ref >60)
GFR calc non Af Amer: >60 mL/min (ref >60)
Glucose, Bld: 96 mg/dL (ref 70–99)
Potassium: 4.2 mmol/L (ref 3.5–5.1)
Sodium: 139 mmol/L (ref 135–145)
Total Bilirubin: 0.5 mg/dL (ref 0.3–1.2)
Total Protein: 7.7 g/dL (ref 6.5–8.1)

## 2019-08-22 LAB — GLUCOSE, CAPILLARY
Glucose-Capillary: 79 mg/dL (ref 70–99)
Glucose-Capillary: 143 mg/dL — ABNORMAL HIGH (ref 70–99)
Glucose-Capillary: 112 mg/dL — ABNORMAL HIGH (ref 70–99)
Glucose-Capillary: 86 mg/dL (ref 70–99)

## 2019-08-22 LAB — MAGNESIUM: Magnesium: 2 mg/dL (ref 1.7–2.4)

## 2019-08-22 NOTE — Progress Notes (Signed)
Spoke with patients primary contact. This nurse provided updates on patient plan of care and answered all current questions.

## 2019-08-22 NOTE — Progress Notes (Signed)
PROGRESS NOTE    Tyler [name]  QPY:[id number] DOB: 1971-08-04 DOA: 07/23/2019 PCP: Patient, No Pcp Per   Brief Narrative:  48 year old male with history of polysubstance abuse including heroin presented to [facility] ER after being found down with a syringe nearby.  He was given Narcan with minimal improvement.  Found in severe rhabdomyolysis with acute kidney injury and hyperkalemia.  He was started on IV fluids including bicarb drip and transferred to [facility] for further care.  Rhabdomyolysis improved but he had persistence of encephalopathy and was found to have acute bilateral cerebellar infarct.  Neurology was consulted.  Neurology has signed off.  PT recommends SNF placement.   Assessment & Plan:   Principal Problem:   Rhabdomyolysis Active Problems:   Acute kidney failure (HCC)   Acute metabolic encephalopathy   Polysubstance abuse (HCC)   Hyperkalemia   Transaminitis   HTN (hypertension)   Cerebral embolism with cerebral infarction   Protein-calorie malnutrition, severe  Acute metabolic encephalopathy: Multifactorial -Still ongoing, not verbally responding to questions asked, sometimes refusing wound care, meals -Most likely secondary to drug use, uremia, heavy alcohol use -CT of the head and neck was initially negative for any traumatic finding -UDS, Tylenol and salicylate levels were negative. -TSH, folate, B12 levels all within normal limits -EEG: Generalized diffuse slowing but no seizure activity -Had required intermittent restraints initially during the hospitalization. -Fall precautions -Awaiting SNF placement.  Acute bilateral CVA, nonhemorrhagic -MRI of the brain showed acute CVA -Neurology evaluated the patient and has signed off.  MRA of the head negative.  Ultrasound carotid showed bilateral 1 to 39% ICA stenosis -Neurology recommends considering 30-day cardiac event monitoring as outpatient to rule out A. fib if patient neurologically improves -LDL 83;  A1c 5.1. -Continue aspirin, will start statin once LFTs normalized. -Echocardiogram: EF of 60 to 65% -Lower extremity Dopplers: Negative for DVT - PT recommends SNF.  Severe rhabdomyolysis AKI -Improved.  Creatinine 1.21 today. -Renal ultrasound negative -Monitor intermittently.  Off IV fluids  Transaminitis with elevated bilirubin -Improving -Right upper quadrant ultrasound showed hepatic steatosis with early cirrhosis with some gallbladder thickening -Monitor intermittently.  Alcohol abuse -Advised to quit -Continue thiamine, multivitamin and folic acid.  Urinary tract infection -Present on admission -Completed 5 days of Rocephin  Essential hypertension -Continue metoprolol  -Continue to monitor blood pressure and adjust medications as needed.  Severe malnutrition -Poor oral intake -Follow nutrition recommendations  DVT prophylaxis: Lovenox Code Status: Full Family Communication: None at bedside Disposition Plan: SNF once bed available, difficult placement  Consultants: Neurology  Procedures: None  Antimicrobials:   None    Subjective: Patient is a very poor historian.  Nonverbal at this time.  No acute events reported.  Objective: Vitals:   08/21/19 2155 08/22/19 0606 08/22/19 0847 08/22/19 1100  BP: (!) 132/98 (!) 136/95 (!) 135/113 (!) 131/108  Pulse: 72 75 66 80  Resp: _0 Temp: 98.4 F (36.9 C) 97.9 F (36.6 C)  97.7 F (36.5 C)  TempSrc: Oral   Axillary  SpO2: 100% 99% 100% 99%  Weight:  60 kg    Height:        Intake/Output Summary (Last 24 hours) at 08/22/2019 1553 Last data filed at 08/22/2019 0926 Gross per 24 hour  Intake 250 ml  Output 550 ml  Net -300 ml   Filed Weights   08/20/19 0500 08/21/19 0500 08/22/19 0606  Weight: 62.8 kg 62.2 kg 60 kg    Examination:  General exam:  Nonverbal, does not appear to be in any acute distress Respiratory system: Decreased breath sounds lower lobes, no rhonchi or wheezing  Cardiovascular system: S1 & S2 heard. No murmur. Gastrointestinal system: Abdomen is nondistended, soft and nontender. Normal bowel sounds heard. Central nervous system: Patient nonverbal, not following commands, unable to do a neurologic exam at this time Psychiatry: Nonverbal, flat affect    Data Reviewed: I have personally reviewed following labs and imaging studies  CBC: Recent Labs  Lab 08/20/19 0538 08/22/19 0307  WBC 6.3 6.9  NEUTROABS 3.2 3.7  HGB 11.2* 13.2  HCT 34.8* 38.5*  MCV 97.2 95.3  PLT 251 296   Basic Metabolic Panel: Recent Labs  Lab 08/20/19 0538 08/22/19 0307  NA 137 139  K 4.1 4.2  CL 103 104  CO2 24 25  GLUCOSE 87 96  BUN 36* 27*  CREATININE 1.16 1.21  CALCIUM 9.7 9.7  MG 2.0 2.0   GFR: Estimated Creatinine Clearance: 62 mL/min (by C-G formula based on SCr of 1.21 mg/dL). Liver Function Tests: Recent Labs  Lab 08/20/19 0538 08/22/19 0307  AST 123* 112*  ALT 151* 146*  ALKPHOS 71 75  BILITOT 0.7 0.5  PROT 7.8 7.7  ALBUMIN 3.4* 3.7   No results for input(s): LIPASE, AMYLASE in the last 168 hours. No results for input(s): AMMONIA in the last 168 hours. Coagulation Profile: No results for input(s): INR, PROTIME in the last 168 hours. Cardiac Enzymes: No results for input(s): CKTOTAL, CKMB, CKMBINDEX, TROPONINI in the last 168 hours. BNP (last 3 results) No results for input(s): PROBNP in the last 8760 hours. HbA1C: No results for input(s): HGBA1C in the last 72 hours. CBG: Recent Labs  Lab 08/21/19 1159 08/21/19 1637 08/21/19 2153 08/22/19 0845 08/22/19 1216  GLUCAP 100* 76 99 79 143*   Lipid Profile: No results for input(s): CHOL, HDL, LDLCALC, TRIG, CHOLHDL, LDLDIRECT in the last 72 hours. Thyroid Function Tests: No results for input(s): TSH, T4TOTAL, FREET4, T3FREE, THYROIDAB in the last 72 hours. Anemia Panel: No results for input(s): VITAMINB12, FOLATE, FERRITIN, TIBC, IRON, RETICCTPCT in the last 72 hours. Sepsis  Labs: No results for input(s): PROCALCITON, LATICACIDVEN in the last 168 hours.  No results found for this or any previous visit (from the past 240 hour(s)).       Radiology Studies: No results found.      Scheduled Meds: .  stroke: mapping our early stages of recovery book   Does not apply Once  . aspirin  300 mg Rectal Daily   Or  . aspirin  325 mg Oral Daily  . chlorhexidine gluconate (MEDLINE KIT)  15 mL Mouth Rinse BID  . enoxaparin (LOVENOX) injection  40 mg Subcutaneous Q24H  . feeding supplement (ENSURE ENLIVE)  237 mL Oral BID BM  . folic acid  1 mg Oral Daily  . insulin aspart  0-15 Units Subcutaneous TID WC  . insulin aspart  0-5 Units Subcutaneous QHS  . lactulose  20 g Oral BID   Or  . lactulose  300 mL Rectal BID  . mouth rinse  15 mL Mouth Rinse BID  . metoprolol tartrate  25 mg Oral BID  . multivitamin with minerals  1 tablet Oral Daily  . thiamine  100 mg Oral Daily   Or  . thiamine  100 mg Intravenous Daily   Continuous Infusions:   LOS: 30 days     , MD [facility] Pager on amion  If 7PM-7AM, please contact night-coverage   [email or web address] Password [facility] 08/22/2019, 3:53 PM

## 2019-08-23 LAB — CBC
HCT: 40.7 % (ref 39.0–52.0)
Hemoglobin: 13 g/dL (ref 13.0–17.0)
MCH: 31 pg (ref 26.0–34.0)
MCHC: 31.9 g/dL (ref 30.0–36.0)
MCV: 97.1 fL (ref 80.0–100.0)
Platelets: 304 10*3/uL (ref 150–400)
RBC: 4.19 MIL/uL — ABNORMAL LOW (ref 4.22–5.81)
RDW: 16 % — ABNORMAL HIGH (ref 11.5–15.5)
WBC: 6.4 10*3/uL (ref 4.0–10.5)
nRBC: 0 % (ref 0.0–0.2)

## 2019-08-23 LAB — COMPREHENSIVE METABOLIC PANEL
ALT: 141 U/L — ABNORMAL HIGH (ref 0–44)
AST: 101 U/L — ABNORMAL HIGH (ref 15–41)
Albumin: 3.7 g/dL (ref 3.5–5.0)
Alkaline Phosphatase: 80 U/L (ref 38–126)
Anion gap: 8 (ref 5–15)
BUN: 30 mg/dL — ABNORMAL HIGH (ref 6–20)
CO2: 27 mmol/L (ref 22–32)
Calcium: 9.6 mg/dL (ref 8.9–10.3)
Chloride: 103 mmol/L (ref 98–111)
Creatinine, Ser: 1.33 mg/dL — ABNORMAL HIGH (ref 0.61–1.24)
GFR calc Af Amer: >60 mL/min (ref >60)
GFR calc non Af Amer: >60 mL/min (ref >60)
Glucose, Bld: 100 mg/dL — ABNORMAL HIGH (ref 70–99)
Potassium: 4.4 mmol/L (ref 3.5–5.1)
Sodium: 138 mmol/L (ref 135–145)
Total Bilirubin: 0.6 mg/dL (ref 0.3–1.2)
Total Protein: 8 g/dL (ref 6.5–8.1)

## 2019-08-23 LAB — GLUCOSE, CAPILLARY
Glucose-Capillary: 86 mg/dL (ref 70–99)
Glucose-Capillary: 143 mg/dL — ABNORMAL HIGH (ref 70–99)
Glucose-Capillary: 83 mg/dL (ref 70–99)
Glucose-Capillary: 86 mg/dL (ref 70–99)

## 2019-08-23 NOTE — Progress Notes (Signed)
Patient's NIH and mNIHSS discontinued on 08/08/2019 by MD Amin. Patient has received stoke mapping education handout. Patient needs reinforcement with stroke teaching as he exhibits no signs of understanding.

## 2019-08-23 NOTE — Progress Notes (Signed)
PROGRESS NOTE    Tyler [name]  YBW:[id number] DOB: Sep 12, 1971 DOA: 07/23/2019 PCP: Patient, No Pcp Per    Brief Narrative:  48 year old male with history of polysubstance abuse including heroin presented to [facility] ER after being found down with a syringe nearby. He was given Narcan with minimal improvement. Found in severe rhabdomyolysis with acute kidney injury and hyperkalemia. He was started on IV fluids including bicarb drip and transferred to [facility] for further care. Rhabdomyolysis improved but he had persistence of encephalopathy and was found to have acute bilateral cerebellar infarct. Neurology was consulted. Neurology has signed off. PT recommends SNF placement.   Assessment & Plan:   Principal Problem:   Rhabdomyolysis Active Problems:   Acute kidney failure (HCC)   Acute metabolic encephalopathy   Polysubstance abuse (HCC)   Hyperkalemia   Transaminitis   HTN (hypertension)   Cerebral embolism with cerebral infarction   Protein-calorie malnutrition, severe   Acute metabolic encephalopathy: Multifactorial -Still ongoing, not verbally responding to questions asked, sometimes refusing wound care, meals -Most likely secondary to drug use, uremia, heavy alcohol use -CT of the head and neck was initially negative for any traumatic finding -UDS, Tylenol and salicylate levels were negative. -TSH, folate, B12 levels all within normal limits -EEG: Generalized diffuse slowing but no seizure activity -Had required intermittent restraints initially during the hospitalization. -Fall precautions -Awaiting SNF placement.  Acute bilateral CVA, nonhemorrhagic -MRI of the brain showed acute CVA -Neurology evaluated the patient and has signed off. MRA of the head negative. Ultrasound carotid showed bilateral 1 to 39% ICA stenosis -Neurology recommends considering 30-day cardiac event monitoring as outpatient to rule out A. fib if patient neurologically improves -LDL  83; A1c 5.1. -Continue aspirin, will start statin once LFTs normalized. -Echocardiogram: EF of 60 to 65% -Lower extremity Dopplers: Negative for DVT - PT recommends SNF.  Severe rhabdomyolysis AKI -Improved. Creatinine 1.33 today. -Renal ultrasound negative -Monitor intermittently. Off IV fluids  Transaminitis with elevated bilirubin -Improving -Right upper quadrant ultrasound showed hepatic steatosis with early cirrhosis with some gallbladder thickening -Monitor intermittently.  Alcohol abuse -Advised to quit -Continue thiamine, multivitamin and folic acid.  Urinary tract infection -Present on admission -Completed 5 days of Rocephin  Essential hypertension -Continue metoprolol  -Continue to monitor blood pressure and adjust medications as needed.  Severe malnutrition -Poor oral intake -Follow nutrition recommendations  DVT prophylaxis:Lovenox Code Status:Full Family Communication:None at bedside Disposition Plan:SNF once bed available, difficult placement  Consultants:Neurology  Procedures: None  Antimicrobials:   None    Subjective: Patient is a very poor historian.  He is awake, oriented x1.  Objective: Vitals:   08/23/19 0417 08/23/19 0500 08/23/19 0754 08/23/19 1201  BP: (!) 147/96  (!) 138/102 128/89  Pulse: 71  68 78  Resp: '17  19 17  ' Temp: 97.9 F (36.6 C)  98.9 F (37.2 C) 98.2 F (36.8 C)  TempSrc: Oral     SpO2: 100%  100% 98%  Weight:  60 kg    Height:       No intake or output data in the 24 hours ending 08/23/19 1839 Filed Weights   08/21/19 0500 08/22/19 0606 08/23/19 0500  Weight: 62.2 kg 60 kg 60 kg    Examination:  General exam: Awake, oriented x1, does not appear to be in any acute distress at this time Respiratory system: Clear to auscultation. Respiratory effort normal. Cardiovascular system: S1 & S2. No murmur. No pedal edema. Gastrointestinal system: Abdomen is nondistended, soft and nontender.  Normal  bowel sounds heard. Central nervous system: Oriented x1, not following commands but able to move all his extremities Psychiatry: Flat affect.     Data Reviewed: I have personally reviewed following labs and imaging studies  CBC: Recent Labs  Lab 08/20/19 0538 08/22/19 0307 08/23/19 0228  WBC 6.3 6.9 6.4  NEUTROABS 3.2 3.7  --   HGB 11.2* 13.2 13.0  HCT 34.8* 38.5* 40.7  MCV 97.2 95.3 97.1  PLT 251 296 809   Basic Metabolic Panel: Recent Labs  Lab 08/20/19 0538 08/22/19 0307 08/23/19 0228  NA 137 139 138  K 4.1 4.2 4.4  CL 103 104 103  CO2 '24 25 27  ' GLUCOSE 87 96 100*  BUN 36* 27* 30*  CREATININE 1.16 1.21 1.33*  CALCIUM 9.7 9.7 9.6  MG 2.0 2.0  --    GFR: Estimated Creatinine Clearance: 56.4 mL/min (A) (by C-G formula based on SCr of 1.33 mg/dL (H)). Liver Function Tests: Recent Labs  Lab 08/20/19 0538 08/22/19 0307 08/23/19 0228  AST 123* 112* 101*  ALT 151* 146* 141*  ALKPHOS 71 75 80  BILITOT 0.7 0.5 0.6  PROT 7.8 7.7 8.0  ALBUMIN 3.4* 3.7 3.7   No results for input(s): LIPASE, AMYLASE in the last 168 hours. No results for input(s): AMMONIA in the last 168 hours. Coagulation Profile: No results for input(s): INR, PROTIME in the last 168 hours. Cardiac Enzymes: No results for input(s): CKTOTAL, CKMB, CKMBINDEX, TROPONINI in the last 168 hours. BNP (last 3 results) No results for input(s): PROBNP in the last 8760 hours. HbA1C: No results for input(s): HGBA1C in the last 72 hours. CBG: Recent Labs  Lab 08/22/19 1702 08/22/19 2145 08/23/19 0753 08/23/19 1200 08/23/19 1702  GLUCAP 112* 86 86 143* 83   Lipid Profile: No results for input(s): CHOL, HDL, LDLCALC, TRIG, CHOLHDL, LDLDIRECT in the last 72 hours. Thyroid Function Tests: No results for input(s): TSH, T4TOTAL, FREET4, T3FREE, THYROIDAB in the last 72 hours. Anemia Panel: No results for input(s): VITAMINB12, FOLATE, FERRITIN, TIBC, IRON, RETICCTPCT in the last 72 hours. Sepsis Labs:  No results for input(s): PROCALCITON, LATICACIDVEN in the last 168 hours.  No results found for this or any previous visit (from the past 240 hour(s)).       Radiology Studies: No results found.      Scheduled Meds: .  stroke: mapping our early stages of recovery book   Does not apply Once  . aspirin  300 mg Rectal Daily   Or  . aspirin  325 mg Oral Daily  . chlorhexidine gluconate (MEDLINE KIT)  15 mL Mouth Rinse BID  . enoxaparin (LOVENOX) injection  40 mg Subcutaneous Q24H  . feeding supplement (ENSURE ENLIVE)  237 mL Oral BID BM  . folic acid  1 mg Oral Daily  . insulin aspart  0-15 Units Subcutaneous TID WC  . insulin aspart  0-5 Units Subcutaneous QHS  . lactulose  20 g Oral BID   Or  . lactulose  300 mL Rectal BID  . mouth rinse  15 mL Mouth Rinse BID  . metoprolol tartrate  25 mg Oral BID  . multivitamin with minerals  1 tablet Oral Daily  . thiamine  100 mg Oral Daily   Or  . thiamine  100 mg Intravenous Daily   Continuous Infusions:   LOS: 31 days    Yaakov Guthrie, MD [facility] Pager on amion  If 7PM-7AM, please contact night-coverage [email or web address] Password [facility] 08/23/2019, 6:39 PM

## 2019-08-23 NOTE — Progress Notes (Signed)
  Speech Language Pathology Treatment: Cognitive-Linquistic  Patient Details Name: Tyler [name] MRN: [id number] DOB: Mar 09, 1971 Today's Date: 08/23/2019 Time: 4166-0630 SLP Time Calculation (min) (ACUTE ONLY): 24 min  Assessment / Plan / Recommendation Clinical Impression  Pt was seen for cognitive-linguistic treatment and was cooperative during the session. Considering his reduced participation and limited responses during structured cognitive tasks, the session was completed in a less structured manner while he consumed lunch. His verbal output was improved and he provided "I don't know" as his response to fewer questions. He was re-oriented to place and time but exhibited difficulty retaining this information despite cues. He required max cues for verbal sequencing during the session but was able to self-feed with tactile cues for initiation. He demonstrated 0% accuracy with complex problem solving related to safety despite max cues but mod cues were needed for basic problem solving. Increased variability in his responses was demonstrated during the session but his responses still did not answer many of the questions. For example, in response to many questions pt stated, "I would have to risk it", "I would have to service it and see if it tested right", "I'm going to play it close" or some variation thereof. Overall, his progress with speech pathology since the initial evaluation of 08/07/19 has been limited. His frequency will be reduced to once per week at this time but SLP will continue to follow pt.    HPI HPI: Pt is a 48 y.o. male with medical history significant of polysubstance abuse including heroin who was brought in to [facility] ED on 07/23/19 after being found down by friend with nearby syringe. He was transferred to [facility] for further care. MRI of the brain of 07/25/19 showed patchy acute bilateral cerebral and cerebellar infarcts. Right temporal lobe encephalomalacia and chronic  cerebral white matter disease.       SLP Plan  Continue with current plan of care       Recommendations                   Follow up Recommendations: Skilled Nursing facility;24 hour supervision/assistance SLP Visit Diagnosis: Cognitive communication deficit (Z60.109) Plan: Continue with current plan of care       Kalif Kattner I. Hardin Negus, [place], [place] Office number [phone] Pager [place] 08/23/2019, 3:24 PM

## 2019-08-23 NOTE — Progress Notes (Signed)
Occupational Therapy Treatment Patient Details Name: Tyler [name] MRN: [id number] DOB: 08/06/1971 Today's Date: 08/23/2019    History of present illness Pt is a 48 y/o male transferred from [place] hospital for unresponsiveness, likely secondary to substance abuse. Pt with rhabdomyolysis and AKI. EEG revealed moderate diffuse encephalopathy. PMH includes polysubstance abuse and HTN.    OT comments  Pt. Was agreeable to OOB. Pt. Required extensive assist with ADLs secondary to cognition. Pt. Required Min A initially for self feeding but then was S level. Pt. Required cups and bowls to be placed in hand to initiate. Acute OT to continue.   Follow Up Recommendations  SNF;Supervision/Assistance - 24 hour    Equipment Recommendations       Recommendations for Other Services      Precautions / Restrictions Precautions Precautions: Fall Precaution Comments: poor safety awareness Restrictions Weight Bearing Restrictions: No       Mobility Bed Mobility         Supine to sit: Mod assist        Transfers       Sit to Stand: Min assist         General transfer comment: Pt. required cues to use walker and to turn feet.    Balance                                           ADL either performed or assessed with clinical judgement   ADL   Eating/Feeding: Minimal assistance Eating/Feeding Details (indicate cue type and reason): Pt. required min a initally and then was able to feed self at s level. Pt. required cues to increase po intake and to pick up beverages Grooming: Wash/dry hands;Minimal assistance           Upper Body Dressing : Maximal assistance   Lower Body Dressing: Total assistance Lower Body Dressing Details (indicate cue type and reason): Pt. was dep to don socks and was attempting to put on hands instead of feet.              Functional mobility during ADLs: Minimal assistance(min a with transfer bed to recliner with use of  rw. ) General ADL Comments: Pt. required extensive assist with ADL secondary to cognition.     Vision       Perception     Praxis      Cognition Arousal/Alertness: Lethargic Behavior During Therapy: Flat affect Overall Cognitive Status: No family/caregiver present to determine baseline cognitive functioning Area of Impairment: Orientation;Attention;Memory;Following commands;Safety/judgement                 Orientation Level: Place;Time;Situation                      Exercises     Shoulder Instructions       General Comments      Pertinent Vitals/ Pain       Pain Assessment: Faces Pain Score: 0-No pain  Home Living                                          Prior Functioning/Environment              Frequency  Min 2X/week        Progress Toward Goals  OT  Goals(current goals can now be found in the care plan section)  Progress towards OT goals: Progressing toward goals  Acute Rehab OT Goals Potential to Achieve Goals: [place] Discharge plan remains appropriate;Frequency remains appropriate    Co-evaluation                 AM-PAC OT "6 Clicks" Daily Activity     Outcome Measure   Help from another person eating meals?: A Little Help from another person taking care of personal grooming?: A Lot Help from another person toileting, which includes using toliet, bedpan, or urinal?: Total Help from another person bathing (including washing, rinsing, drying)?: Total Help from another person to put on and taking off regular upper body clothing?: A Lot Help from another person to put on and taking off regular lower body clothing?: Total 6 Click Score: 10    End of Session Equipment Utilized During Treatment: Rolling walker  OT Visit Diagnosis: Other abnormalities of gait and mobility (R26.89);Other symptoms and signs involving cognitive function;Other symptoms and signs involving the nervous system (R29.898);Muscle  weakness (generalized) (M62.81)   Activity Tolerance Patient tolerated treatment well   Patient Left in chair;with call bell/phone within reach;with chair alarm set   Nurse Communication (nurse ok therapy)        Time: 6433-2951 OT Time Calculation (min): 46 min  Charges: OT General Charges $OT Visit: 1 Visit OT Treatments $Self Care/Home Management : 38-52 mins  6 clilcks   Dequante Tremaine 08/23/2019, 9:58 AM

## 2019-08-23 NOTE — Progress Notes (Signed)
Physical Therapy Treatment Patient Details Name: Tyler [name] MRN: [id number] DOB: [birth year] Today's Date: 08/23/2019    History of Present Illness Pt is a 48 y/o male transferred from [place] for unresponsiveness, likely secondary to substance abuse. Pt with rhabdomyolysis and AKI. EEG revealed moderate diffuse encephalopathy. PMH includes polysubstance abuse and HTN.     PT Comments    Pt performed gt training and functional mobility with multimodal cueing.  He lacks problem solving and still very lethargic during session.  He presented with eyes closed most of session.  Pt continues to benefit from SNF placement to improve strength and function as he is currently requiring moderate assistance.  Plan next session for continued progression of functional mobility.      Follow Up Recommendations  SNF;Supervision for mobility/OOB     Equipment Recommendations  None recommended by PT;Other (comment)    Recommendations for Other Services       Precautions / Restrictions Precautions Precautions: Fall Precaution Comments: poor safety awareness Restrictions Weight Bearing Restrictions: No    Mobility  Bed Mobility Overal bed mobility: Needs Assistance Bed Mobility: Supine to Sit     Supine to sit: Mod assist     General bed mobility comments: Pt required assistance to advance LEs to edge of bed and elevate trunk into sitting.  He would quickly try to return to bed but required cues to stay sitting upright.  Transfers Overall transfer level: Needs assistance Equipment used: Rolling walker (2 wheeled) Transfers: Sit to/from Stand Sit to Stand: Mod assist         General transfer comment: Required hand over hand placement to reach for rolling walker and moderate assistance to boost into standing.  Ambulation/Gait Ambulation/Gait assistance: Mod assist Gait Distance (Feet): 200 Feet Assistive device: Rolling walker (2 wheeled) Gait Pattern/deviations:  Step-through pattern;Shuffle;Staggering right;Staggering left;Trunk flexed     General Gait Details: Pt with poor ability to negotiate obstacles and manage RW position.  He was often seen with eyes closed and no reaction to running into objects.   Stairs             Wheelchair Mobility    Modified Rankin (Stroke Patients Only) Modified Rankin (Stroke Patients Only) Pre-Morbid Rankin Score: No symptoms Modified Rankin: Moderately severe disability     Balance Overall balance assessment: Needs assistance   Sitting balance-Leahy Scale: Poor       Standing balance-Leahy Scale: Zero                              Cognition Arousal/Alertness: Lethargic Behavior During Therapy: Flat affect Overall Cognitive Status: No family/caregiver present to determine baseline cognitive functioning Area of Impairment: Orientation;Attention;Memory;Following commands;Safety/judgement                 Orientation Level: Place;Time;Situation Current Attention Level: Focused Memory: Decreased recall of precautions;Decreased short-term memory Following Commands: Follows one step commands inconsistently Safety/Judgement: Decreased awareness of safety;Decreased awareness of deficits Awareness: Intellectual Problem Solving: Slow processing;Decreased initiation;Difficulty sequencing;Requires verbal cues;Requires tactile cues        Exercises      General Comments        Pertinent Vitals/Pain Pain Assessment: No/denies pain Pain Score: 0-No pain Faces Pain Scale: No hurt Pain Location: no visual indication of pain, not respoding when directly asked about pain    Home Living  Prior Function            PT Goals (current goals can now be found in the care plan section) Acute Rehab PT Goals Patient Stated Goal: none stated PT Goal Formulation: Patient unable to participate in goal setting Potential to Achieve Goals: Fair Progress  towards PT goals: Progressing toward goals    Frequency    Min 2X/week      PT Plan Current plan remains appropriate    Co-evaluation              AM-PAC PT "6 Clicks" Mobility   Outcome Measure  Help needed turning from your back to your side while in a flat bed without using bedrails?: None Help needed moving from lying on your back to sitting on the side of a flat bed without using bedrails?: A Lot Help needed moving to and from a bed to a chair (including a wheelchair)?: Total Help needed standing up from a chair using your arms (e.g., wheelchair or bedside chair)?: Total Help needed to walk in hospital room?: Total Help needed climbing 3-5 steps with a railing? : Total 6 Click Score: 10    End of Session Equipment Utilized During Treatment: Gait belt Activity Tolerance: Patient limited by lethargy Patient left: in bed;with bed alarm set;with call bell/phone within reach Nurse Communication: Mobility status PT Visit Diagnosis: Muscle weakness (generalized) (M62.81);Difficulty in walking, not elsewhere classified (R26.2);Other abnormalities of gait and mobility (R26.89)     Time: [id number] PT Time Calculation (min) (ACUTE ONLY): 18 min  Charges:  $Gait Training: 8-22 mins                     Tyler [name], PTA Acute Rehabilitation Services Pager [phone] Office [phone]     Tyler [name] 08/23/2019, 5:42 PM

## 2019-08-24 LAB — GLUCOSE, CAPILLARY
Glucose-Capillary: 85 mg/dL (ref 70–99)
Glucose-Capillary: 124 mg/dL — ABNORMAL HIGH (ref 70–99)
Glucose-Capillary: 125 mg/dL — ABNORMAL HIGH (ref 70–99)
Glucose-Capillary: 85 mg/dL (ref 70–99)

## 2019-08-24 NOTE — Progress Notes (Signed)
PROGRESS NOTE    Tyler [name]  ZOX:[id number] DOB: 06/17/1971 DOA: 07/23/2019 PCP: Patient, No Pcp Per    Brief Narrative:  48 year old male with history of polysubstance abuse including heroin presented to [facility] ER after being found down with a syringe nearby. He was given Narcan with minimal improvement. Found in severe rhabdomyolysis with acute kidney injury and hyperkalemia. He was started on IV fluids including bicarb drip and transferred to [facility] for further care. Rhabdomyolysis improved but he had persistence of encephalopathy and was found to have acute bilateral cerebellar infarct. Neurology was consulted. Neurology has signed off. PT recommends SNF placement.   Assessment & Plan:   Principal Problem:   Rhabdomyolysis Active Problems:   Acute kidney failure (HCC)   Acute metabolic encephalopathy   Polysubstance abuse (HCC)   Hyperkalemia   Transaminitis   HTN (hypertension)   Cerebral embolism with cerebral infarction   Protein-calorie malnutrition, severe   Acute metabolic encephalopathy: Multifactorial -Still ongoing, does not respond to questions asked, sometimes refusing wound care, meals -Most likely secondary to drug use, uremia, heavy alcohol use -CT of the head and neck was initially negative for any traumatic finding -UDS, Tylenol and salicylate levels were negative. -TSH, folate, B12 levels all within normal limits -EEG: Generalized diffuse slowing but no seizure activity -Had required intermittent restraints initially during the hospitalization. -Fall precautions -Awaiting SNF placement.  Acute bilateral CVA, nonhemorrhagic -MRI of the brain showed acute CVA -Neurology evaluated the patient and has signed off. MRA of the head negative. Ultrasound carotid showed bilateral 1 to 39% ICA stenosis -Neurology recommends considering 30-day cardiac event monitoring as outpatient to rule out A. fib if patient neurologically improves. -LDL 83;  A1c 5.1. -Continue aspirin, LFTs slowly improving.  Will start statin once LFTs normalized. -Echocardiogram: EF of 60 to 65% -Lower extremity Dopplers: Negative for DVT - PT recommends SNF.  Severe rhabdomyolysis AKI -Creatinine fluctuating. -Renal ultrasound negative -Monitor intermittently. Off IV fluids  Transaminitis with elevated bilirubin - Slowly improving -Right upper quadrant ultrasound showed hepatic steatosis with early cirrhosis with some gallbladder thickening -Monitor intermittently.  Alcohol abuse -Advised to quit -Continue thiamine, multivitamin and folic acid.  Urinary tract infection -Present on admission -Completed 5 days of Rocephin  Essential hypertension -Continue metoprolol  -Continue to monitor blood pressure and adjust medications as needed.  Severe malnutrition -Poor oral intake -Follow nutrition recommendations  DVT prophylaxis:Lovenox Code Status:Full Family Communication:None at bedside Disposition Plan:SNF once bed available, difficult placement  Consultants:Neurology  Procedures: None  Antimicrobials:   None    Subjective: Patient is a poor historian.  Minimally verbal at this time.  No acute events reported.  Objective: Vitals:   08/23/19 2142 08/24/19 0100 08/24/19 0356 08/24/19 0546  BP: (!) 119/91 116/89  125/86  Pulse: 77     Resp: 16 16    Temp: 97.8 F (36.6 C) 98.6 F (37 C)  97.9 F (36.6 C)  TempSrc: Oral Oral  Oral  SpO2: 100% 100%  100%  Weight:   60 kg   Height:        Intake/Output Summary (Last 24 hours) at 08/24/2019 1537 Last data filed at 08/24/2019 1330 Gross per 24 hour  Intake 937 ml  Output 250 ml  Net 687 ml   Filed Weights   08/22/19 0606 08/23/19 0500 08/24/19 0356  Weight: 60 kg 60 kg 60 kg    Examination:  General exam: Minimally verbal, does not appear to be in any acute distress Respiratory system:  Decreased breath sound lower lobes otherwise clear to  auscultation Cardiovascular system: S1 & S2 heard. No murmur.  Gastrointestinal system: Abdomen is nondistended, soft and nontender. Normal bowel sounds heard. Central nervous system: Not following commands.  Unable to do a neurologic exam at this time. Psychiatry: Flat affect.   Data Reviewed: I have personally reviewed following labs and imaging studies  CBC: Recent Labs  Lab 08/20/19 0538 08/22/19 0307 08/23/19 0228  WBC 6.3 6.9 6.4  NEUTROABS 3.2 3.7  --   HGB 11.2* 13.2 13.0  HCT 34.8* 38.5* 40.7  MCV 97.2 95.3 97.1  PLT 251 296 161   Basic Metabolic Panel: Recent Labs  Lab 08/20/19 0538 08/22/19 0307 08/23/19 0228  NA 137 139 138  K 4.1 4.2 4.4  CL 103 104 103  CO2 _0 GLUCOSE 87 96 100*  BUN 36* 27* 30*  CREATININE 1.16 1.21 1.33*  CALCIUM 9.7 9.7 9.6  MG 2.0 2.0  --    GFR: Estimated Creatinine Clearance: 56.4 mL/min (A) (by C-G formula based on SCr of 1.33 mg/dL (H)). Liver Function Tests: Recent Labs  Lab 08/20/19 0538 08/22/19 0307 08/23/19 0228  AST 123* 112* 101*  ALT 151* 146* 141*  ALKPHOS 71 75 80  BILITOT 0.7 0.5 0.6  PROT 7.8 7.7 8.0  ALBUMIN 3.4* 3.7 3.7   No results for input(s): LIPASE, AMYLASE in the last 168 hours. No results for input(s): AMMONIA in the last 168 hours. Coagulation Profile: No results for input(s): INR, PROTIME in the last 168 hours. Cardiac Enzymes: No results for input(s): CKTOTAL, CKMB, CKMBINDEX, TROPONINI in the last 168 hours. BNP (last 3 results) No results for input(s): PROBNP in the last 8760 hours. HbA1C: No results for input(s): HGBA1C in the last 72 hours. CBG: Recent Labs  Lab 08/23/19 1200 08/23/19 1702 08/23/19 2152 08/24/19 0838 08/24/19 1238  GLUCAP 143* 83 86 85 124*   Lipid Profile: No results for input(s): CHOL, HDL, LDLCALC, TRIG, CHOLHDL, LDLDIRECT in the last 72 hours. Thyroid Function Tests: No results for input(s): TSH, T4TOTAL, FREET4, T3FREE, THYROIDAB in the last 72  hours. Anemia Panel: No results for input(s): VITAMINB12, FOLATE, FERRITIN, TIBC, IRON, RETICCTPCT in the last 72 hours. Sepsis Labs: No results for input(s): PROCALCITON, LATICACIDVEN in the last 168 hours.  No results found for this or any previous visit (from the past 240 hour(s)).       Radiology Studies: No results found.      Scheduled Meds: .  stroke: mapping our early stages of recovery book   Does not apply Once  . aspirin  300 mg Rectal Daily   Or  . aspirin  325 mg Oral Daily  . chlorhexidine gluconate (MEDLINE KIT)  15 mL Mouth Rinse BID  . enoxaparin (LOVENOX) injection  40 mg Subcutaneous Q24H  . feeding supplement (ENSURE ENLIVE)  237 mL Oral BID BM  . folic acid  1 mg Oral Daily  . insulin aspart  0-15 Units Subcutaneous TID WC  . insulin aspart  0-5 Units Subcutaneous QHS  . lactulose  20 g Oral BID   Or  . lactulose  300 mL Rectal BID  . mouth rinse  15 mL Mouth Rinse BID  . metoprolol tartrate  25 mg Oral BID  . multivitamin with minerals  1 tablet Oral Daily  . thiamine  100 mg Oral Daily   Or  . thiamine  100 mg Intravenous Daily   Continuous Infusions:   LOS:  74 days    Yaakov Guthrie, MD [facility] Pager on [place]  If 7PM-7AM, please contact night-coverage [email or web address] Password [facility] 08/24/2019, 3:37 PM

## 2019-08-24 NOTE — Plan of Care (Signed)
  Problem: Clinical Measurements: Goal: Ability to maintain clinical measurements within normal limits will improve Outcome: Progressing   Problem: Activity: Goal: Risk for activity intolerance will decrease Outcome: Progressing   Problem: Nutrition: Goal: Adequate nutrition will be maintained Outcome: Progressing   Problem: Health Behavior/Discharge Planning: Goal: Ability to manage health-related needs will improve Outcome: Progressing   Problem: Nutrition: Goal: Risk of aspiration will decrease Outcome: Progressing Goal: Dietary intake will improve Outcome: Progressing   Problem: Ischemic Stroke/TIA Tissue Perfusion: Goal: Complications of ischemic stroke/TIA will be minimized Outcome: Progressing   Problem: Clinical Measurements: Goal: Ability to maintain clinical measurements within normal limits will improve Outcome: Progressing Goal: Will remain free from infection Outcome: Progressing Goal: Diagnostic test results will improve Outcome: Progressing   Problem: Activity: Goal: Risk for activity intolerance will decrease Outcome: Progressing   Problem: Nutrition: Goal: Adequate nutrition will be maintained Outcome: Progressing   

## 2019-08-24 NOTE — Progress Notes (Signed)
Physical Therapy Treatment Patient Details Name: Tyler [name] MRN: [id number] DOB: 05/04/1971 Today's Date: 08/24/2019    History of Present Illness Pt is a 48 y/o male transferred from [facility] for unresponsiveness, likely secondary to substance abuse. Pt with rhabdomyolysis and AKI. EEG revealed moderate diffuse encephalopathy. PMH includes polysubstance abuse and HTN.     PT Comments    Pt performed gt training without device today.  ON arrival he was alert and oriented to himself.  PTA able to reorient with repetition and then he was able to recall.  Pt appears to have visual impairment as he was unable to find items in his room without hand over hand guidance.  Difficult to assess if this is due to vision or cognitive impairment.  He required min to moderate assistance and continues to benefit from rehab in a post acute setting.     Follow Up Recommendations  SNF;Supervision for mobility/OOB     Equipment Recommendations  None recommended by PT;Other (comment)    Recommendations for Other Services       Precautions / Restrictions Precautions Precautions: Fall Precaution Comments: poor safety awareness Restrictions Weight Bearing Restrictions: No    Mobility  Bed Mobility Overal bed mobility: Needs Assistance Bed Mobility: Supine to Sit     Supine to sit: Supervision     General bed mobility comments: Pt able to come to sitting with max VCs to edge of bed.  Transfers Overall transfer level: Needs assistance Equipment used: None Transfers: Sit to/from Stand Sit to Stand: Min assist         General transfer comment: Cues for hand placement to and from seated surface. Max VCs.  Ambulation/Gait Ambulation/Gait assistance: Min assist;Mod assist Gait Distance (Feet): 200 Feet Assistive device: None Gait Pattern/deviations: Step-through pattern;Shuffle;Staggering right;Staggering left;Trunk flexed     General Gait Details: Pt with  better ability to  negotiate obstacles without the RW.  He initially required HHA.  When he becomes directionally challenge he required moderate assistance to correct path of RW.   Stairs             Wheelchair Mobility    Modified Rankin (Stroke Patients Only) Modified Rankin (Stroke Patients Only) Pre-Morbid Rankin Score: No symptoms Modified Rankin: Moderately severe disability     Balance Overall balance assessment: Needs assistance Sitting-balance support: Feet supported;Single extremity supported;Bilateral upper extremity supported Sitting balance-Leahy Scale: Fair       Standing balance-Leahy Scale: Poor Standing balance comment: external assistance to maintain balance.                            Cognition Arousal/Alertness: Awake/alert Behavior During Therapy: Flat affect Overall Cognitive Status: No family/caregiver present to determine baseline cognitive functioning                                 General Comments: He cognition is improved from previous session as he is attempting to problem solve but lacks basic stratedgy, appears he may have a visual deficit.  When reaching for an object it appears his depth perception is skewed.      Exercises      General Comments        Pertinent Vitals/Pain Pain Assessment: No/denies pain    Home Living                      Prior  Function            PT Goals (current goals can now be found in the care plan section) Acute Rehab PT Goals Potential to Achieve Goals: Fair Progress towards PT goals: Progressing toward goals    Frequency    Min 2X/week      PT Plan Current plan remains appropriate    Co-evaluation              AM-PAC PT "6 Clicks" Mobility   Outcome Measure  Help needed turning from your back to your side while in a flat bed without using bedrails?: None Help needed moving from lying on your back to sitting on the side of a flat bed without using bedrails?: A  Little Help needed moving to and from a bed to a chair (including a wheelchair)?: A Little Help needed standing up from a chair using your arms (e.g., wheelchair or bedside chair)?: A Little Help needed to walk in hospital room?: A Lot Help needed climbing 3-5 steps with a railing? : A Lot 6 Click Score: 17    End of Session Equipment Utilized During Treatment: Gait belt Activity Tolerance: Patient limited by lethargy Patient left: in bed;with bed alarm set;with call bell/phone within reach Nurse Communication: Mobility status PT Visit Diagnosis: Muscle weakness (generalized) (M62.81);Difficulty in walking, not elsewhere classified (R26.2);Other abnormalities of gait and mobility (R26.89)     Time: 9147-8295 PT Time Calculation (min) (ACUTE ONLY): 23 min  Charges:  $Gait Training: 8-22 mins $Therapeutic Exercise: 8-22 mins                     Governor Rooks, PTA Acute Rehabilitation Services Pager [phone] Office [phone]     Tyler [name] 08/24/2019, 4:54 PM

## 2019-08-25 LAB — COMPREHENSIVE METABOLIC PANEL
ALT: 108 U/L — ABNORMAL HIGH (ref 0–44)
AST: 75 U/L — ABNORMAL HIGH (ref 15–41)
Albumin: 3.5 g/dL (ref 3.5–5.0)
Alkaline Phosphatase: 73 U/L (ref 38–126)
Anion gap: 11 (ref 5–15)
BUN: 31 mg/dL — ABNORMAL HIGH (ref 6–20)
CO2: 24 mmol/L (ref 22–32)
Calcium: 9.7 mg/dL (ref 8.9–10.3)
Chloride: 104 mmol/L (ref 98–111)
Creatinine, Ser: 1.16 mg/dL (ref 0.61–1.24)
GFR calc Af Amer: >60 mL/min (ref >60)
GFR calc non Af Amer: >60 mL/min (ref >60)
Glucose, Bld: 71 mg/dL (ref 70–99)
Potassium: 3.8 mmol/L (ref 3.5–5.1)
Sodium: 139 mmol/L (ref 135–145)
Total Bilirubin: 0.8 mg/dL (ref 0.3–1.2)
Total Protein: 7.7 g/dL (ref 6.5–8.1)

## 2019-08-25 LAB — CBC
HCT: 38.1 % — ABNORMAL LOW (ref 39.0–52.0)
Hemoglobin: 12.8 g/dL — ABNORMAL LOW (ref 13.0–17.0)
MCH: 32.1 pg (ref 26.0–34.0)
MCHC: 33.6 g/dL (ref 30.0–36.0)
MCV: 95.5 fL (ref 80.0–100.0)
Platelets: 269 10*3/uL (ref 150–400)
RBC: 3.99 MIL/uL — ABNORMAL LOW (ref 4.22–5.81)
RDW: 15.6 % — ABNORMAL HIGH (ref 11.5–15.5)
WBC: 7.3 10*3/uL (ref 4.0–10.5)
nRBC: 0 % (ref 0.0–0.2)

## 2019-08-25 LAB — MRSA PCR SCREENING: MRSA by PCR: NEGATIVE

## 2019-08-25 LAB — GLUCOSE, CAPILLARY
Glucose-Capillary: 75 mg/dL (ref 70–99)
Glucose-Capillary: 156 mg/dL — ABNORMAL HIGH (ref 70–99)
Glucose-Capillary: 65 mg/dL — ABNORMAL LOW (ref 70–99)
Glucose-Capillary: 127 mg/dL — ABNORMAL HIGH (ref 70–99)
Glucose-Capillary: 134 mg/dL — ABNORMAL HIGH (ref 70–99)

## 2019-08-25 NOTE — Plan of Care (Signed)
Pt has been unable to understand teaching due to altered mentation up to this point.  Pt has altered mentation again today.

## 2019-08-25 NOTE — Progress Notes (Signed)
PROGRESS NOTE    Tyler [name]  NWG:[id number] DOB: [birth year] DOA: 07/23/2019 PCP: Patient, No Pcp Per    Brief Narrative:  48 year old male with history of polysubstance abuse including heroin presented to [facility] ER after being found down with a syringe nearby. He was given Narcan with minimal improvement. Found in severe rhabdomyolysis with acute kidney injury and hyperkalemia. He was started on IV fluids including bicarb drip and transferred to [facility] for further care. Rhabdomyolysis improved but he had persistence of encephalopathy and was found to have acute bilateral cerebellar infarct. Neurology was consulted. Neurology has signed off. PT recommends SNF placement.  08/25/2019: Patient seen.  Patient is a poor historian.  No new changes.  Patient is awaiting skilled nursing facility placement.   Assessment & Plan:   Principal Problem:   Rhabdomyolysis Active Problems:   Acute kidney failure (HCC)   Acute metabolic encephalopathy   Polysubstance abuse (HCC)   Hyperkalemia   Transaminitis   HTN (hypertension)   Cerebral embolism with cerebral infarction   Protein-calorie malnutrition, severe   Acute metabolic encephalopathy: Multifactorial -Still ongoing, does not respond to questions asked, sometimes refusing wound care, meals -Most likely secondary to drug use, uremia, heavy alcohol use -CT of the head and neck was initially negative for any traumatic finding -UDS, Tylenol and salicylate levels were negative. -TSH, folate, B12 levels all within normal limits -EEG: Generalized diffuse slowing but no seizure activity -Had required intermittent restraints initially during the hospitalization. -Fall precautions -Awaiting SNF placement. 08/25/2019: Posterior skilled nursing facility placement.  Acute bilateral CVA, nonhemorrhagic -MRI of the brain showed acute CVA -Neurology evaluated the patient and has signed off. MRA of the head negative. Ultrasound  carotid showed bilateral 1 to 39% ICA stenosis -Neurology recommends considering 30-day cardiac event monitoring as outpatient to rule out A. fib if patient neurologically improves. -LDL 83; A1c 5.1. -Continue aspirin, LFTs slowly improving.  Will start statin once LFTs normalized. -Echocardiogram: EF of 60 to 65% -Lower extremity Dopplers: Negative for DVT - PT recommends SNF.  Severe rhabdomyolysis AKI -Creatinine fluctuating. -Renal ultrasound negative -Monitor intermittently. Off IV fluids 08/25/2019: AKI has resolved.  Transaminitis with elevated bilirubin - Slowly improving -Right upper quadrant ultrasound showed hepatic steatosis with early cirrhosis with some gallbladder thickening -Monitor intermittently.  Alcohol abuse -Advised to quit -Continue thiamine, multivitamin and folic acid.  Urinary tract infection -Present on admission -Completed 5 days of Rocephin  Essential hypertension -Continue metoprolol  -Continue to monitor blood pressure and adjust medications as needed.  Severe malnutrition -Poor oral intake -Follow nutrition recommendations  DVT prophylaxis:Lovenox Code Status:Full Family Communication:None at bedside Disposition Plan:SNF once bed available, difficult placement  Consultants:Neurology  Procedures: None  Antimicrobials:   None    Subjective: Patient is a poor historian. No new complaints.  Objective: Vitals:   08/24/19 2211 08/25/19 0424 08/25/19 0634 08/25/19 0700  BP: 118/82  (!) 142/110 (!) 132/100  Pulse: 68  72   Resp: 16  16   Temp: 98.2 F (36.8 C)  98.4 F (36.9 C)   TempSrc:      SpO2: 100%  100%   Weight:  60.1 kg    Height:        Intake/Output Summary (Last 24 hours) at 08/25/2019 1006 Last data filed at 08/25/2019 0800 Gross per 24 hour  Intake 657 ml  Output -  Net 657 ml   Filed Weights   08/23/19 0500 08/24/19 0356 08/25/19 0424  Weight: 60 kg 60 kg 60.1  kg    Examination:   General exam: Patient is not in any distress. Respiratory system: Clear to auscultation. Cardiovascular system: S1 & S2 heard. No murmur.  Gastrointestinal system: Abdomen is nondistended, soft and nontender. Normal bowel sounds heard. Central nervous system: Not following commands easily.    Data Reviewed: I have personally reviewed following labs and imaging studies  CBC: Recent Labs  Lab 08/20/19 0538 08/22/19 0307 08/23/19 0228 08/25/19 0303  WBC 6.3 6.9 6.4 7.3  NEUTROABS 3.2 3.7  --   --   HGB 11.2* 13.2 13.0 12.8*  HCT 34.8* 38.5* 40.7 38.1*  MCV 97.2 95.3 97.1 95.5  PLT 251 296 304 401   Basic Metabolic Panel: Recent Labs  Lab 08/20/19 0538 08/22/19 0307 08/23/19 0228 08/25/19 0303  NA 137 139 138 139  K 4.1 4.2 4.4 3.8  CL 103 104 103 104  CO2 24 25 27 24   GLUCOSE 87 96 100* 71  BUN 36* 27* 30* 31*  CREATININE 1.16 1.21 1.33* 1.16  CALCIUM 9.7 9.7 9.6 9.7  MG 2.0 2.0  --   --    GFR: Estimated Creatinine Clearance: 64.8 mL/min (by C-G formula based on SCr of 1.16 mg/dL). Liver Function Tests: Recent Labs  Lab 08/20/19 0538 08/22/19 0307 08/23/19 0228 08/25/19 0303  AST 123* 112* 101* 75*  ALT 151* 146* 141* 108*  ALKPHOS 71 75 80 73  BILITOT 0.7 0.5 0.6 0.8  PROT 7.8 7.7 8.0 7.7  ALBUMIN 3.4* 3.7 3.7 3.5   No results for input(s): LIPASE, AMYLASE in the last 168 hours. No results for input(s): AMMONIA in the last 168 hours. Coagulation Profile: No results for input(s): INR, PROTIME in the last 168 hours. Cardiac Enzymes: No results for input(s): CKTOTAL, CKMB, CKMBINDEX, TROPONINI in the last 168 hours. BNP (last 3 results) No results for input(s): PROBNP in the last 8760 hours. HbA1C: No results for input(s): HGBA1C in the last 72 hours. CBG: Recent Labs  Lab 08/24/19 0838 08/24/19 1238 08/24/19 1710 08/24/19 2213 08/25/19 0748  GLUCAP 85 124* 125* 85 75   Lipid Profile: No results for input(s): CHOL, HDL, LDLCALC, TRIG, CHOLHDL,  LDLDIRECT in the last 72 hours. Thyroid Function Tests: No results for input(s): TSH, T4TOTAL, FREET4, T3FREE, THYROIDAB in the last 72 hours. Anemia Panel: No results for input(s): VITAMINB12, FOLATE, FERRITIN, TIBC, IRON, RETICCTPCT in the last 72 hours. Sepsis Labs: No results for input(s): PROCALCITON, LATICACIDVEN in the last 168 hours.  No results found for this or any previous visit (from the past 240 hour(s)).       Radiology Studies: No results found.      Scheduled Meds: .  stroke: mapping our early stages of recovery book   Does not apply Once  . aspirin  300 mg Rectal Daily   Or  . aspirin  325 mg Oral Daily  . enoxaparin (LOVENOX) injection  40 mg Subcutaneous Q24H  . feeding supplement (ENSURE ENLIVE)  237 mL Oral BID BM  . folic acid  1 mg Oral Daily  . insulin aspart  0-15 Units Subcutaneous TID WC  . insulin aspart  0-5 Units Subcutaneous QHS  . lactulose  20 g Oral BID   Or  . lactulose  300 mL Rectal BID  . mouth rinse  15 mL Mouth Rinse BID  . metoprolol tartrate  25 mg Oral BID  . multivitamin with minerals  1 tablet Oral Daily  . thiamine  100 mg Oral Daily  Or  . thiamine  100 mg Intravenous Daily   Continuous Infusions:   LOS: 33 days    Barnetta ChapelSylvester I Ogbata, MD [facility] Pager on amion  If 7PM-7AM, please contact night-coverage [email or web address] Password [email or web address] 08/25/2019, 10:05 AM

## 2019-08-25 NOTE — Progress Notes (Signed)
Pt is using utensils to feed himself, but he says he's having trouble seeing food.  Unable to obtain good assessment due to limited ability to answer questions and follow commands. He says "it's not new."

## 2019-08-25 NOTE — Progress Notes (Signed)
Pt's blood sugar was 65, we do not have ensure as ordered.  So he was given orange juice and chocolate pudding.

## 2019-08-25 NOTE — Progress Notes (Signed)
Tyler [name], work number [phone]

## 2019-08-26 LAB — GLUCOSE, CAPILLARY
Glucose-Capillary: 72 mg/dL (ref 70–99)
Glucose-Capillary: 107 mg/dL — ABNORMAL HIGH (ref 70–99)
Glucose-Capillary: 80 mg/dL (ref 70–99)
Glucose-Capillary: 120 mg/dL — ABNORMAL HIGH (ref 70–99)
Glucose-Capillary: 114 mg/dL — ABNORMAL HIGH (ref 70–99)
Glucose-Capillary: 111 mg/dL — ABNORMAL HIGH (ref 70–99)

## 2019-08-26 NOTE — Progress Notes (Addendum)
During shift assessment, upon inquiry of how patient doing, patient stated not doing too well, asked why, patient stated "some crazy shit." When asked if he was hurting, stated "same place." When asked to point to area where hurting, patient did not point to any area.   Able to follow command to stick out tongue, grip and wiggle toes.   Patient able to state DOB correctly, however, not able to identify place. Patient stated had no children when asked. Patient also unable to recall first and last name when asked upon med admin.   During bath at 0415 observed mass approximately size of golf ball in right groin. Patient stated yes when asked if palpation hurt, however, patient also stated other areas of body painful to palpation so reliability of patient report of pain questionable due to complex cognitive functioning. Mass soft and round upon palpation. Day RN made aware.  CBG 78 at 0728; OJ given.   Paged NP Bodenheimer at [phone]; informed BP 151/119. No IV access to give PRN hydralazine.Has 1000 metoprolol 25mg  scheduled. Upon BP reassessment, patient BP 129/90, oral hydralazine held.

## 2019-08-26 NOTE — Progress Notes (Signed)
PROGRESS NOTE    Tyler [name]  BTD:[id number] DOB: Oct 10, 1971 DOA: 07/23/2019 PCP: Patient, No Pcp Per    Brief Narrative:  48 year old male with history of polysubstance abuse including heroin presented to [facility] ER after being found down with a syringe nearby. He was given Narcan with minimal improvement. Found in severe rhabdomyolysis with acute kidney injury and hyperkalemia. He was started on IV fluids including bicarb drip and transferred to [facility] for further care. Rhabdomyolysis improved but he had persistence of encephalopathy and was found to have acute bilateral cerebellar infarct. Neurology was consulted. Neurology has signed off. PT recommends SNF placement.  08/26/2019: Patient seen.  Patient is a poor historian.  No new changes.  Patient is awaiting skilled nursing facility placement.   Assessment & Plan:   Principal Problem:   Rhabdomyolysis Active Problems:   Acute kidney failure (HCC)   Acute metabolic encephalopathy   Polysubstance abuse (HCC)   Hyperkalemia   Transaminitis   HTN (hypertension)   Cerebral embolism with cerebral infarction   Protein-calorie malnutrition, severe   Acute metabolic encephalopathy: Multifactorial -Still ongoing, does not respond to questions asked, sometimes refusing wound care, meals -Most likely secondary to drug use, uremia, heavy alcohol use -CT of the head and neck was initially negative for any traumatic finding -UDS, Tylenol and salicylate levels were negative. -TSH, folate, B12 levels all within normal limits -EEG: Generalized diffuse slowing but no seizure activity -Had required intermittent restraints initially during the hospitalization. -Fall precautions -Awaiting SNF placement. 08/26/2019: Pursue skilled nursing facility placement.  Acute bilateral CVA, nonhemorrhagic -MRI of the brain showed acute CVA -Neurology evaluated the patient and has signed off. MRA of the head negative. Ultrasound carotid  showed bilateral 1 to 39% ICA stenosis -Neurology recommends considering 30-day cardiac event monitoring as outpatient to rule out A. fib if patient neurologically improves. -LDL 83; A1c 5.1. -Continue aspirin, LFTs slowly improving.  Will start statin once LFTs normalized. -Echocardiogram: EF of 60 to 65% -Lower extremity Dopplers: Negative for DVT - PT recommends SNF.  Severe rhabdomyolysis AKI -Creatinine fluctuating. -Renal ultrasound negative -Monitor intermittently. Off IV fluids 08/26/2019: AKI has resolved.  Transaminitis with elevated bilirubin - Slowly improving -Right upper quadrant ultrasound showed hepatic steatosis with early cirrhosis with some gallbladder thickening -Monitor intermittently.  Alcohol abuse -Advised to quit -Continue thiamine, multivitamin and folic acid.  Urinary tract infection -Present on admission -Completed 5 days of Rocephin  Essential hypertension -Continue metoprolol  -Continue to monitor blood pressure and adjust medications as needed.  Severe malnutrition -Poor oral intake -Follow nutrition recommendations  DVT prophylaxis:Lovenox Code Status:Full Family Communication:None at bedside Disposition Plan:SNF once bed available, difficult placement  Consultants:Neurology  Procedures: None  Antimicrobials:   None    Subjective: Patient is a poor historian. No new complaints.  Objective: Vitals:   08/25/19 2133 08/26/19 0533 08/26/19 0555 08/26/19 1300  BP:  (!) 138/98  (!) 127/97  Pulse:  70  70  Resp:  18  19  Temp: 98.6 F (37 C) 98.3 F (36.8 C)  98.5 F (36.9 C)  TempSrc: Oral Oral  Oral  SpO2:  100%  100%  Weight:   63.2 kg   Height:        Intake/Output Summary (Last 24 hours) at 08/26/2019 1434 Last data filed at 08/26/2019 0900 Gross per 24 hour  Intake 1300 ml  Output --  Net 1300 ml   Filed Weights   08/24/19 0356 08/25/19 0424 08/26/19 0555  Weight: 60 kg  60.1 kg 63.2 kg     Examination:  General exam: Patient is not in any distress. Respiratory system: Clear to auscultation. Cardiovascular system: S1 & S2 heard. No murmur.  Gastrointestinal system: Abdomen is nondistended, soft and nontender. Normal bowel sounds heard. Central nervous system: Patient is awake and alert.  Not following commands easily.    Data Reviewed: I have personally reviewed following labs and imaging studies  CBC: Recent Labs  Lab 08/20/19 0538 08/22/19 0307 08/23/19 0228 08/25/19 0303  WBC 6.3 6.9 6.4 7.3  NEUTROABS 3.2 3.7  --   --   HGB 11.2* 13.2 13.0 12.8*  HCT 34.8* 38.5* 40.7 38.1*  MCV 97.2 95.3 97.1 95.5  PLT 251 296 304 269   Basic Metabolic Panel: Recent Labs  Lab 08/20/19 0538 08/22/19 0307 08/23/19 0228 08/25/19 0303  NA 137 139 138 139  K 4.1 4.2 4.4 3.8  CL 103 104 103 104  CO2 24 25 27 24   GLUCOSE 87 96 100* 71  BUN 36* 27* 30* 31*  CREATININE 1.16 1.21 1.33* 1.16  CALCIUM 9.7 9.7 9.6 9.7  MG 2.0 2.0  --   --    GFR: Estimated Creatinine Clearance: 68.1 mL/min (by C-G formula based on SCr of 1.16 mg/dL). Liver Function Tests: Recent Labs  Lab 08/20/19 0538 08/22/19 0307 08/23/19 0228 08/25/19 0303  AST 123* 112* 101* 75*  ALT 151* 146* 141* 108*  ALKPHOS 71 75 80 73  BILITOT 0.7 0.5 0.6 0.8  PROT 7.8 7.7 8.0 7.7  ALBUMIN 3.4* 3.7 3.7 3.5   No results for input(s): LIPASE, AMYLASE in the last 168 hours. No results for input(s): AMMONIA in the last 168 hours. Coagulation Profile: No results for input(s): INR, PROTIME in the last 168 hours. Cardiac Enzymes: No results for input(s): CKTOTAL, CKMB, CKMBINDEX, TROPONINI in the last 168 hours. BNP (last 3 results) No results for input(s): PROBNP in the last 8760 hours. HbA1C: No results for input(s): HGBA1C in the last 72 hours. CBG: Recent Labs  Lab 08/25/19 2109 08/26/19 0530 08/26/19 0653 08/26/19 0805 08/26/19 1204  GLUCAP 134* 72 107* 80 120*   Lipid Profile: No  results for input(s): CHOL, HDL, LDLCALC, TRIG, CHOLHDL, LDLDIRECT in the last 72 hours. Thyroid Function Tests: No results for input(s): TSH, T4TOTAL, FREET4, T3FREE, THYROIDAB in the last 72 hours. Anemia Panel: No results for input(s): VITAMINB12, FOLATE, FERRITIN, TIBC, IRON, RETICCTPCT in the last 72 hours. Sepsis Labs: No results for input(s): PROCALCITON, LATICACIDVEN in the last 168 hours.  Recent Results (from the past 240 hour(s))  MRSA PCR Screening     Status: None   Collection Time: 08/25/19 12:55 PM   Specimen: Nasopharyngeal  Result Value Ref Range Status   MRSA by PCR NEGATIVE NEGATIVE Final    Comment:        The GeneXpert MRSA Assay (FDA approved for NASAL specimens only), is one component of a comprehensive MRSA colonization surveillance program. It is not intended to diagnose MRSA infection nor to guide or monitor treatment for MRSA infections. Performed at [facility], [address] [place], [place] [address]          Radiology Studies: No results found.      Scheduled Meds:   stroke: mapping our early stages of recovery book   Does not apply Once   aspirin  300 mg Rectal Daily   Or   aspirin  325 mg Oral Daily   enoxaparin (LOVENOX) injection  40  mg Subcutaneous Q24H   feeding supplement (ENSURE ENLIVE)  237 mL Oral BID BM   folic acid  1 mg Oral Daily   insulin aspart  0-15 Units Subcutaneous TID WC   insulin aspart  0-5 Units Subcutaneous QHS   lactulose  20 g Oral BID   Or   lactulose  300 mL Rectal BID   mouth rinse  15 mL Mouth Rinse BID   metoprolol tartrate  25 mg Oral BID   multivitamin with minerals  1 tablet Oral Daily   thiamine  100 mg Oral Daily   Or   thiamine  100 mg Intravenous Daily   Continuous Infusions:   LOS: 34 days    Tyler ChapelSylvester I Berenize Gatlin, MD [facility] Pager on amion  If 7PM-7AM, please contact night-coverage [email or web address] Password [email or web address] 08/26/2019, 2:34 PM

## 2019-08-26 NOTE — Progress Notes (Signed)
Received call from patient's daughter at 2147. Provided daughter Tyler [name] with information about patient's status; patient calm, resting, drowsy and patient having some trouble seeing per day RN. Daughter stated patient is nearsighted and needs glasses, however, does not have any. Daughter stated she has also provided briefs for patient to wear.   Patient has difficulty following some commands (ex: turn/reposition in bed). Better able to follow commands with coaching and redirection.    CBG 72 at 0530. Patient given orange juice and chocolate pudding. CBG at 0654 is 107.

## 2019-08-27 LAB — GLUCOSE, CAPILLARY
Glucose-Capillary: 78 mg/dL (ref 70–99)
Glucose-Capillary: 76 mg/dL (ref 70–99)
Glucose-Capillary: 117 mg/dL — ABNORMAL HIGH (ref 70–99)
Glucose-Capillary: 98 mg/dL (ref 70–99)
Glucose-Capillary: 93 mg/dL (ref 70–99)

## 2019-08-27 MED ORDER — HYDRALAZINE HCL 25 MG PO TABS
25.0000 mg | ORAL_TABLET | Freq: Once | ORAL | Status: DC
  Filled 2019-08-27: qty 1

## 2019-08-27 NOTE — Progress Notes (Signed)
PROGRESS NOTE    Tyler [name]  OFB:[id number] DOB: [birth year] DOA: 07/23/2019 PCP: Patient, No Pcp Per   Brief Narrative:  Patient is a 48 year old male with history of polysubstance abuse including heroin presented to [facility] ER after being found down with a syringe nearby. He was given Narcan with minimal improvement. Found in severe rhabdomyolysis with acute kidney injury and hyperkalemia. He was started on IV fluids including bicarb drip and transferred to [facility] for further care. Rhabdomyolysis improved but he had persistence of encephalopathy and was found to have acute bilateral cerebellar infarct. Neurology was consulted. Neurology has signed off. PT recommends SNF placement.   Assessment & Plan:   Principal Problem:   Rhabdomyolysis Active Problems:   Acute kidney failure (HCC)   Acute metabolic encephalopathy   Polysubstance abuse (HCC)   Hyperkalemia   Transaminitis   HTN (hypertension)   Cerebral embolism with cerebral infarction   Protein-calorie malnutrition, severe   Acute metabolic encephalopathy: Multifactorial -Most likely secondary CVA, drug use, uremia, heavy alcohol use He is alert and awake but not oriented -CT of the head and neck was initially negative for any traumatic finding -UDS, Tylenol and salicylate levels were negative. -TSH, folate, B12 levels all within normal limits -EEG: Generalized diffuse slowing but no seizure activity -Had required intermittent restraints initially during the hospitalization. -Fall precautions -Awaiting SNF placement.Difficult placement   Acute bilateral CVA, nonhemorrhagic -MRI of the brain showed acute CVA -Neurology evaluated the patient and has signed off. MRA of the head negative. Ultrasound carotid showed bilateral 1 to 39% ICA stenosis -Neurology recommends considering 30-day cardiac event monitoring as outpatient to rule out A. fib if patient neurologically improves. -LDL 83; A1c 5.1.  -Continue aspirin, LFTs slowly improving.  Will start statin once LFTs normalized. -Echocardiogram: EF of 60 to 65% -Lower extremity Dopplers: Negative for DVT - PT recommends SNF.  Severe rhabdomyolysis -AKI has resolved -Renal ultrasound negative -Monitor intermittently. Off IV fluids  Transaminitis with elevated bilirubin - Slowly improving -Right upper quadrant ultrasound showed hepatic steatosis with early cirrhosis with some gallbladder thickening -Monitor intermittently.  Alcohol abuse -Advised to quit -Continue thiamine, multivitamin and folic acid.  Urinary tract infection -Present on admission -Completed 5 days of Rocephin  Essential hypertension -Continue metoprolol -Continue to monitor blood pressure and adjust medications as needed.  Severe malnutrition -Poor oral intake -Follow nutrition recommendations   Nutrition Problem: Severe Malnutrition Etiology: acute illness(acute toxic/metabolic encephalopathy)      DVT prophylaxis: Lovenox Code Status: Full Family Communication: None present at the bed side Disposition Plan: SNF as soon as bed is available   Consultants: Neurology  Procedures:None  Antimicrobials:  Anti-infectives (From admission, onward)   Start     Dose/Rate Route Frequency Ordered Stop   07/25/19 0745  cefTRIAXone (ROCEPHIN) 1 g in sodium chloride 0.9 % 100 mL IVPB     1 g 200 mL/hr over 30 Minutes Intravenous Every 24 hours 07/25/19 0734 07/29/19 1518      Subjective:  Patient seen and examined the bedside this morning.  Alert and awake but not oriented.  Looks comfortable.  Denies any complaints.  He has been ambulating without any concerns.  Sitting on the chair during my evaluation.  Objective: Vitals:   08/26/19 2243 08/27/19 0419 08/27/19 0428 08/27/19 0534  BP:  (!) 151/119  129/90  Pulse: 67 70  60  Resp:  18    Temp:  97.6 F (36.4 C)    TempSrc:  SpO2:  100%    Weight:   60.5 kg   Height:         Intake/Output Summary (Last 24 hours) at 08/27/2019 1204 Last data filed at 08/27/2019 1000 Gross per 24 hour  Intake 1600 ml  Output -  Net 1600 ml   Filed Weights   08/25/19 0424 08/26/19 0555 08/27/19 0428  Weight: 60.1 kg 63.2 kg 60.5 kg    Examination:  General exam: Appears calm and comfortable ,Not in distress,average built HEENT:PERRL,Oral mucosa moist, Ear/Nose normal on gross exam Respiratory system: Bilateral equal air entry, normal vesicular breath sounds, no wheezes or crackles  Cardiovascular system: S1 & S2 heard, RRR. No JVD, murmurs, rubs, gallops or clicks. No pedal edema. Gastrointestinal system: Abdomen is nondistended, soft and nontender. No organomegaly or masses felt. Normal bowel sounds heard. Central nervous system: Alert and awake but not oriented. No focal neurological deficits. Extremities: No edema, no clubbing ,no cyanosis, distal peripheral pulses palpable. Skin: No rashes, lesions or ulcers,no icterus ,no pallor    Data Reviewed: I have personally reviewed following labs and imaging studies  CBC: Recent Labs  Lab 08/22/19 0307 08/23/19 0228 08/25/19 0303  WBC 6.9 6.4 7.3  NEUTROABS 3.7  --   --   HGB 13.2 13.0 12.8*  HCT 38.5* 40.7 38.1*  MCV 95.3 97.1 95.5  PLT 296 304 269   Basic Metabolic Panel: Recent Labs  Lab 08/22/19 0307 08/23/19 0228 08/25/19 0303  NA 139 138 139  K 4.2 4.4 3.8  CL 104 103 104  CO2 25 27 24   GLUCOSE 96 100* 71  BUN 27* 30* 31*  CREATININE 1.21 1.33* 1.16  CALCIUM 9.7 9.6 9.7  MG 2.0  --   --    GFR: Estimated Creatinine Clearance: 65.2 mL/min (by C-G formula based on SCr of 1.16 mg/dL). Liver Function Tests: Recent Labs  Lab 08/22/19 0307 08/23/19 0228 08/25/19 0303  AST 112* 101* 75*  ALT 146* 141* 108*  ALKPHOS 75 80 73  BILITOT 0.5 0.6 0.8  PROT 7.7 8.0 7.7  ALBUMIN 3.7 3.7 3.5   No results for input(s): LIPASE, AMYLASE in the last 168 hours. No results for input(s): AMMONIA in the  last 168 hours. Coagulation Profile: No results for input(s): INR, PROTIME in the last 168 hours. Cardiac Enzymes: No results for input(s): CKTOTAL, CKMB, CKMBINDEX, TROPONINI in the last 168 hours. BNP (last 3 results) No results for input(s): PROBNP in the last 8760 hours. HbA1C: No results for input(s): HGBA1C in the last 72 hours. CBG: Recent Labs  Lab 08/26/19 1722 08/26/19 2147 08/27/19 0424 08/27/19 0754 08/27/19 1151  GLUCAP 114* 111* 78 76 117*   Lipid Profile: No results for input(s): CHOL, HDL, LDLCALC, TRIG, CHOLHDL, LDLDIRECT in the last 72 hours. Thyroid Function Tests: No results for input(s): TSH, T4TOTAL, FREET4, T3FREE, THYROIDAB in the last 72 hours. Anemia Panel: No results for input(s): VITAMINB12, FOLATE, FERRITIN, TIBC, IRON, RETICCTPCT in the last 72 hours. Sepsis Labs: No results for input(s): PROCALCITON, LATICACIDVEN in the last 168 hours.  Recent Results (from the past 240 hour(s))  MRSA PCR Screening     Status: None   Collection Time: 08/25/19 12:55 PM   Specimen: Nasopharyngeal  Result Value Ref Range Status   MRSA by PCR NEGATIVE NEGATIVE Final    Comment:        The GeneXpert MRSA Assay (FDA approved for NASAL specimens only), is one component of a comprehensive MRSA colonization surveillance program. It  is not intended to diagnose MRSA infection nor to guide or monitor treatment for MRSA infections. Performed at [facility], [place] [address], [place], [place] [address]          Radiology Studies: No results found.      Scheduled Meds: .  stroke: mapping our early stages of recovery book   Does not apply Once  . aspirin  300 mg Rectal Daily   Or  . aspirin  325 mg Oral Daily  . enoxaparin (LOVENOX) injection  40 mg Subcutaneous Q24H  . feeding supplement (ENSURE ENLIVE)  237 mL Oral BID BM  . folic acid  1 mg Oral Daily  . hydrALAZINE  25 mg Oral Once  . insulin aspart  0-15 Units Subcutaneous TID WC  . insulin  aspart  0-5 Units Subcutaneous QHS  . lactulose  20 g Oral BID   Or  . lactulose  300 mL Rectal BID  . mouth rinse  15 mL Mouth Rinse BID  . metoprolol tartrate  25 mg Oral BID  . multivitamin with minerals  1 tablet Oral Daily  . thiamine  100 mg Oral Daily   Or  . thiamine  100 mg Intravenous Daily   Continuous Infusions:   LOS: 35 days    Time spent: 35 mins.More than 50% of that time was spent in counseling and/or coordination of care.      Shelly Coss, MD [facility] Pager [phone]  If 7PM-7AM, please contact night-coverage [email or web address] Password [facility] 08/27/2019, 12:04 PM

## 2019-08-27 NOTE — Progress Notes (Signed)
Physical Therapy Treatment Patient Details Name: Tyler [name] MRN: [id number] DOB: 1971/10/01 Today's Date: 08/27/2019    History of Present Illness Pt is a 48 y/o male transferred from [place] for unresponsiveness, likely secondary to substance abuse. Pt with rhabdomyolysis and AKI. EEG revealed moderate diffuse encephalopathy. PMH includes polysubstance abuse and HTN.     PT Comments    Pt continues to make steady progress. Continue to recommend SNF.    Follow Up Recommendations  SNF;Supervision for mobility/OOB     Equipment Recommendations  Other (comment)(To be determined)    Recommendations for Other Services       Precautions / Restrictions Precautions Precautions: Fall Precaution Comments: poor safety awareness Restrictions Weight Bearing Restrictions: No    Mobility  Bed Mobility               General bed mobility comments: Pt up in chair  Transfers Overall transfer level: Needs assistance Equipment used: 4-wheeled walker Transfers: Sit to/from Stand Sit to Stand: Min assist         General transfer comment: Assist for balance and to bring hips up  Ambulation/Gait Ambulation/Gait assistance: Min assist Gait Distance (Feet): 325 Feet Assistive device: 4-wheeled walker Gait Pattern/deviations: Step-through pattern;Decreased stride length;Drifts right/left   Gait velocity interpretation: 1.31 - 2.62 ft/sec, indicative of limited community ambulator General Gait Details: assist for balance and to negotiate obstacles. Pt frequently running into objects on lt side   Stairs             Wheelchair Mobility    Modified Rankin (Stroke Patients Only) Modified Rankin (Stroke Patients Only) Pre-Morbid Rankin Score: No symptoms Modified Rankin: Moderately severe disability     Balance Overall balance assessment: Needs assistance Sitting-balance support: No upper extremity supported;Feet supported Sitting balance-Leahy Scale:  Fair     Standing balance support: Single extremity supported Standing balance-Leahy Scale: Poor Standing balance comment: UE support                            Cognition Arousal/Alertness: Awake/alert Behavior During Therapy: Flat affect Overall Cognitive Status: No family/caregiver present to determine baseline cognitive functioning Area of Impairment: Orientation;Memory;Following commands;Safety/judgement;Problem solving;Awareness                 Orientation Level: Disoriented to;Place;Time;Situation Current Attention Level: Sustained Memory: Decreased recall of precautions;Decreased short-term memory Following Commands: Follows one step commands consistently Safety/Judgement: Decreased awareness of safety;Decreased awareness of deficits Awareness: Intellectual Problem Solving: Slow processing;Requires verbal cues;Requires tactile cues;Difficulty sequencing;Decreased initiation        Exercises      General Comments        Pertinent Vitals/Pain Pain Assessment: No/denies pain    Home Living                      Prior Function            PT Goals (current goals can now be found in the care plan section) Progress towards PT goals: Progressing toward goals    Frequency    Min 2X/week      PT Plan Current plan remains appropriate    Co-evaluation              AM-PAC PT "6 Clicks" Mobility   Outcome Measure  Help needed turning from your back to your side while in a flat bed without using bedrails?: None Help needed moving from lying on your back to sitting on  the side of a flat bed without using bedrails?: A Little Help needed moving to and from a bed to a chair (including a wheelchair)?: A Little Help needed standing up from a chair using your arms (e.g., wheelchair or bedside chair)?: A Little Help needed to walk in hospital room?: A Little Help needed climbing 3-5 steps with a railing? : A Little 6 Click Score: 19     End of Session Equipment Utilized During Treatment: Gait belt Activity Tolerance: Patient tolerated treatment well Patient left: in chair;with call bell/phone within reach;with chair alarm set   PT Visit Diagnosis: Muscle weakness (generalized) (M62.81);Difficulty in walking, not elsewhere classified (R26.2);Other abnormalities of gait and mobility (R26.89)     Time: [id number] PT Time Calculation (min) (ACUTE ONLY): 13 min  Charges:  $Gait Training: 8-22 mins                     [facility] Center[facility] Itzae Mccurdy PT Acute Rehabilitation Services Pager [phone] Office [phone]    Angelina OkCary W [facility] Center[facility] 08/27/2019, 2:11 PM

## 2019-08-28 LAB — GLUCOSE, CAPILLARY
Glucose-Capillary: 74 mg/dL (ref 70–99)
Glucose-Capillary: 118 mg/dL — ABNORMAL HIGH (ref 70–99)
Glucose-Capillary: 100 mg/dL — ABNORMAL HIGH (ref 70–99)
Glucose-Capillary: 116 mg/dL — ABNORMAL HIGH (ref 70–99)

## 2019-08-28 NOTE — Progress Notes (Signed)
Nutrition Follow-up  RD working remotely.  DOCUMENTATION CODES:   Severe malnutrition in context of acute illness/injury  INTERVENTION:   -Continue MVI with minerals daily -Continue Magic cup TID with meals, each supplement provides 290 kcal and 9 grams of protein -Continue Ensure Enlive po BID, each supplement provides 350 kcal and 20 grams of protein  NUTRITION DIAGNOSIS:   Severe Malnutrition related to acute illness(acute toxic/metabolic encephalopathy) as evidenced by moderate fat depletion, severe fat depletion, moderate muscle depletion, severe muscle depletion, energy intake < or equal to 50% for > or equal to 5 days, percent weight loss.  Ongoing  GOAL:   Patient will meet greater than or equal to 90% of their needs  Progressing   MONITOR:   PO intake, Supplement acceptance, Labs, Weight trends, Skin, I & O's  REASON FOR ASSESSMENT:   LOS    ASSESSMENT:   Tyler [name] is a 48 y.o. male with medical history significant of polysubstance abuse including heroin.  Brought in to [facility] ED just before MN after being found down by friend last evening with nearby syringe.  Reviewed I/O's: +680 ml x 24 hours and +6.8 L since admission  Pt continues with good appetite; noted meal completion 50-100%. Per nursing notes, pt with some difficulty self-feeding related to vision difficulties. Pt is consuming Ensure supplements.   Wt stable over the past week.   Medications reviewed and include lactulose, folic acid, and thiamine.  Per CSW notes, pt remains difficult to place. Awaiting SNF placement when available.   Labs reviewed: CBGS: 76-117 (inpatient orders for glycemic control are 0-15 units insulin aspart TID with meals and 0-5 units insulin aspart q HS).  Diet Order:   Diet Order            DIET DYS 3 Room service appropriate? No; Fluid consistency: Thin  Diet effective now              EDUCATION NEEDS:   Not appropriate for education at this time  Skin:   Skin Assessment: Reviewed RN Assessment  Last BM:  08/27/19  Height:   Ht Readings from Last 1 Encounters:  08/09/19 5\' 8"  (1.727 m)    Weight:   Wt Readings from Last 1 Encounters:  08/28/19 62.4 kg    Ideal Body Weight:  70 kg  BMI:  Body mass index is 20.92 kg/m.  Estimated Nutritional Needs:   Kcal:  2000-2200  Protein:  105-120 grams  Fluid:  > 2 L    Jessica Checketts A. Jimmye Norman, RD, LDN, [place] Registered Dietitian II Certified Diabetes Care and Education Specialist Pager: [phone] After hours Pager: [phone]

## 2019-08-28 NOTE — TOC Progression Note (Signed)
Transition of Care [facility]) - Progression Note    Patient Details  Name: [name] MRN: [id number] Date of Birth: 16-Aug-1971  Transition of [facility]) CM/SW [place], [place] Phone Number: 08/28/2019, 8:18 AM  Clinical Narrative:    Patient continues to be on the Difficult to Place waitlist. Please continue to mobilize the patient.    Expected Discharge Plan: Skilled Nursing Facility Barriers to Discharge: SNF Pending Medicaid, SNF Pending payor source - LOG, Active Substance Use - Placement  Expected Discharge Plan and Services Expected Discharge Plan: [place]   Discharge Planning Services: CM Consult, Other - See comment(Pt will need LOG for SNF placement) Post Acute Care Choice: [place] arrangements for the past 2 months: Single Family Home                 DME Arranged: N/A           HH Agency: NA         Social Determinants of Health (SDOH) Interventions    Readmission Risk Interventions No flowsheet data found.

## 2019-08-28 NOTE — Progress Notes (Signed)
PROGRESS NOTE    Brinson Tozzi  JJK:[id number] DOB: 03-Oct-1971 DOA: 07/23/2019 PCP: Patient, No Pcp Per   Brief Narrative:  Patient is a 48 year old male with history of polysubstance abuse including heroin presented to [facility] ER after being found down with a syringe nearby. He was given Narcan with minimal improvement. Found in severe rhabdomyolysis with acute kidney injury and hyperkalemia. He was started on IV fluids including bicarb drip and transferred to [facility] for further care. Rhabdomyolysis improved but he had persistence of encephalopathy and was found to have acute bilateral cerebellar infarct. Neurology was consulted and has signed off. PT recommends SNF placement.   Assessment & Plan:   Principal Problem:   Rhabdomyolysis Active Problems:   Acute kidney failure (HCC)   Acute metabolic encephalopathy   Polysubstance abuse (HCC)   Hyperkalemia   Transaminitis   HTN (hypertension)   Cerebral embolism with cerebral infarction   Protein-calorie malnutrition, severe   Acute metabolic encephalopathy: Multifactorial -Most likely secondary CVA, drug use, uremia, heavy alcohol use He is alert and awake but not oriented -CT of the head and neck was initially negative for any traumatic finding -UDS, Tylenol and salicylate levels were negative. -TSH, folate, B12 levels all within normal limits -EEG: Generalized diffuse slowing but no seizure activity -Had required intermittent restraints initially during the hospitalization. -Fall precautions -Awaiting SNF placement.Difficult placement   Acute bilateral CVA, nonhemorrhagic -MRI of the brain showed acute CVA -Neurology evaluated the patient and has signed off. MRA of the head negative. Ultrasound carotid showed bilateral 1 to 39% ICA stenosis -Neurology recommends considering 30-day cardiac event monitoring as outpatient to rule out A. fib if patient neurologically improves. -LDL 83; A1c 5.1. -Continue  aspirin, LFTs slowly improving.  Will start statin once LFTs normalized.Will check LFT in next few days. -Echocardiogram: EF of 60 to 65% -Lower extremity Dopplers: Negative for DVT - PT recommends SNF.  Severe rhabdomyolysis -AKI has resolved -Renal ultrasound negative -Monitor intermittently. Off IV fluids  Transaminitis with elevated bilirubin - Slowly improving -Right upper quadrant ultrasound showed hepatic steatosis with early cirrhosis with some gallbladder thickening -Monitor intermittently.  Alcohol abuse -Advised to quit -Continue thiamine, multivitamin and folic acid.  Urinary tract infection -Present on admission -Completed 5 days of Rocephin  Essential hypertension -Continue metoprolol -Continue to monitor blood pressure and adjust medications as needed.  Severe malnutrition -Poor oral intake -Follow nutrition recommendations   Nutrition Problem: Severe Malnutrition Etiology: acute illness(acute toxic/metabolic encephalopathy)      DVT prophylaxis: Lovenox Code Status: Full Family Communication: None present at the bed side Disposition Plan: SNF as soon as bed is available   Consultants: Neurology  Procedures:None  Antimicrobials:  Anti-infectives (From admission, onward)   Start     Dose/Rate Route Frequency Ordered Stop   07/25/19 0745  cefTRIAXone (ROCEPHIN) 1 g in sodium chloride 0.9 % 100 mL IVPB     1 g 200 mL/hr over 30 Minutes Intravenous Every 24 hours 07/25/19 0734 07/29/19 1518      Subjective:  Patient seen and examined bedside this morning.  Hemodynamically stable.  Comfortable.  Sitting on the bed and eating his breakfast.  Denies any complaints.  Alert, awake but not oriented  Objective: Vitals:   08/27/19 2154 08/28/19 0411 08/28/19 0503 08/28/19 0954  BP: 113/76  139/69 (!) 144/91  Pulse: 75  66 77  Resp:      Temp: 98.1 F (36.7 C)  98 F (36.7 C)   TempSrc: Oral  SpO2: 100%  97%   Weight:  62.4 kg     Height:        Intake/Output Summary (Last 24 hours) at 08/28/2019 1135 Last data filed at 08/27/2019 2300 Gross per 24 hour  Intake 200 ml  Output -  Net 200 ml   Filed Weights   08/26/19 0555 08/27/19 0428 08/28/19 0411  Weight: 63.2 kg 60.5 kg 62.4 kg    Examination: General exam: Appears calm and comfortable ,Not in distress,average built HEENT:PERRL,Oral mucosa moist, Ear/Nose normal on gross exam Respiratory system: Bilateral equal air entry, normal vesicular breath sounds, no wheezes or crackles  Cardiovascular system: S1 & S2 heard, RRR. No JVD, murmurs, rubs, gallops or clicks. Gastrointestinal system: Abdomen is nondistended, soft and nontender. No organomegaly or masses felt. Normal bowel sounds heard. Central nervous system: Alert and awake but not oriented. No focal neurological deficits. Extremities: No edema, no clubbing ,no cyanosis, distal peripheral pulses palpable. Skin: No rashes, lesions or ulcers,no icterus ,no pallor MSK: Normal muscle bulk,tone ,power Psychiatry: Judgement and insight appear impaired   Data Reviewed: I have personally reviewed following labs and imaging studies  CBC: Recent Labs  Lab 08/22/19 0307 08/23/19 0228 08/25/19 0303  WBC 6.9 6.4 7.3  NEUTROABS 3.7  --   --   HGB 13.2 13.0 12.8*  HCT 38.5* 40.7 38.1*  MCV 95.3 97.1 95.5  PLT 296 304 269   Basic Metabolic Panel: Recent Labs  Lab 08/22/19 0307 08/23/19 0228 08/25/19 0303  NA 139 138 139  K 4.2 4.4 3.8  CL 104 103 104  CO2 25 27 24   GLUCOSE 96 100* 71  BUN 27* 30* 31*  CREATININE 1.21 1.33* 1.16  CALCIUM 9.7 9.6 9.7  MG 2.0  --   --    GFR: Estimated Creatinine Clearance: 67.2 mL/min (by C-G formula based on SCr of 1.16 mg/dL). Liver Function Tests: Recent Labs  Lab 08/22/19 0307 08/23/19 0228 08/25/19 0303  AST 112* 101* 75*  ALT 146* 141* 108*  ALKPHOS 75 80 73  BILITOT 0.5 0.6 0.8  PROT 7.7 8.0 7.7  ALBUMIN 3.7 3.7 3.5   No results for input(s):  LIPASE, AMYLASE in the last 168 hours. No results for input(s): AMMONIA in the last 168 hours. Coagulation Profile: No results for input(s): INR, PROTIME in the last 168 hours. Cardiac Enzymes: No results for input(s): CKTOTAL, CKMB, CKMBINDEX, TROPONINI in the last 168 hours. BNP (last 3 results) No results for input(s): PROBNP in the last 8760 hours. HbA1C: No results for input(s): HGBA1C in the last 72 hours. CBG: Recent Labs  Lab 08/27/19 0754 08/27/19 1151 08/27/19 1713 08/27/19 2155 08/28/19 0757  GLUCAP 76 117* 98 93 74   Lipid Profile: No results for input(s): CHOL, HDL, LDLCALC, TRIG, CHOLHDL, LDLDIRECT in the last 72 hours. Thyroid Function Tests: No results for input(s): TSH, T4TOTAL, FREET4, T3FREE, THYROIDAB in the last 72 hours. Anemia Panel: No results for input(s): VITAMINB12, FOLATE, FERRITIN, TIBC, IRON, RETICCTPCT in the last 72 hours. Sepsis Labs: No results for input(s): PROCALCITON, LATICACIDVEN in the last 168 hours.  Recent Results (from the past 240 hour(s))  MRSA PCR Screening     Status: None   Collection Time: 08/25/19 12:55 PM   Specimen: Nasopharyngeal  Result Value Ref Range Status   MRSA by PCR NEGATIVE NEGATIVE Final    Comment:        The GeneXpert MRSA Assay (FDA approved for NASAL specimens only), is one component of a  comprehensive MRSA colonization surveillance program. It is not intended to diagnose MRSA infection nor to guide or monitor treatment for MRSA infections. Performed at [facility], [address], [place], [place] [address]          Radiology Studies: No results found.      Scheduled Meds: .  stroke: mapping our early stages of recovery book   Does not apply Once  . aspirin  300 mg Rectal Daily   Or  . aspirin  325 mg Oral Daily  . enoxaparin (LOVENOX) injection  40 mg Subcutaneous Q24H  . feeding supplement (ENSURE ENLIVE)  237 mL Oral BID BM  . folic acid  1 mg Oral Daily  . hydrALAZINE  25  mg Oral Once  . insulin aspart  0-15 Units Subcutaneous TID WC  . insulin aspart  0-5 Units Subcutaneous QHS  . lactulose  20 g Oral BID   Or  . lactulose  300 mL Rectal BID  . mouth rinse  15 mL Mouth Rinse BID  . metoprolol tartrate  25 mg Oral BID  . multivitamin with minerals  1 tablet Oral Daily  . thiamine  100 mg Oral Daily   Or  . thiamine  100 mg Intravenous Daily   Continuous Infusions:   LOS: 36 days    Time spent: 35 mins.More than 50% of that time was spent in counseling and/or coordination of care.      Burnadette Pop, MD [facility] Pager [phone]  If 7PM-7AM, please contact night-coverage [email or web address] Password [facility] 08/28/2019, 11:35 AM

## 2019-08-28 NOTE — TOC Progression Note (Signed)
Transition of Care [facility]) - Progression Note    Patient Details  Name: Tyler [name] MRN: [id number] Date of Birth: 05/23/1971  Transition of Care [facility]) CM/SW [place], [place] Phone Number: 08/28/2019, 8:17 AM  Clinical Narrative:    Patient continues to be on [facility] SNF waitlist.    Expected Discharge Plan: [place] Barriers to Discharge: SNF Pending Medicaid, SNF Pending payor source - LOG, Active Substance Use - Placement  Expected Discharge Plan and Services Expected Discharge Plan: [place]   Discharge Planning Services: CM Consult, Other - See comment(Pt will need LOG for SNF placement) Post Acute Care Choice: [place] arrangements for the past 2 months: Single Family Home                 DME Arranged: N/A           HH Agency: NA         Social Determinants of Health (SDOH) Interventions    Readmission Risk Interventions No flowsheet data found.

## 2019-08-28 NOTE — TOC Progression Note (Signed)
Transition of Care [facility]) - Progression Note    Patient Details  Name: Tyler [name] MRN: [id number] Date of Birth: 1970-12-19  Transition of Care [facility]) CM/SW [place], [place] Phone Number: 08/28/2019, 8:17 AM  Clinical Narrative:    Patient continues to remain on the [facility] SNF waitlist. Please continue mobilizing the patient.    Expected Discharge Plan: Skilled Nursing Facility Barriers to Discharge: SNF Pending Medicaid, SNF Pending payor source - LOG, Active Substance Use - Placement  Expected Discharge Plan and Services Expected Discharge Plan: [place]   Discharge Planning Services: CM Consult, Other - See comment(Pt will need LOG for SNF placement) Post Acute Care Choice: [place] arrangements for the past 2 months: Single Family Home                 DME Arranged: N/A           HH Agency: NA         Social Determinants of Health (SDOH) Interventions    Readmission Risk Interventions No flowsheet data found.

## 2019-08-29 LAB — GLUCOSE, CAPILLARY
Glucose-Capillary: 70 mg/dL (ref 70–99)
Glucose-Capillary: 177 mg/dL — ABNORMAL HIGH (ref 70–99)
Glucose-Capillary: 81 mg/dL (ref 70–99)
Glucose-Capillary: 99 mg/dL (ref 70–99)

## 2019-08-29 NOTE — Plan of Care (Signed)
  Problem: Clinical Measurements: Goal: Ability to maintain clinical measurements within normal limits will improve Outcome: Progressing   Problem: Activity: Goal: Risk for activity intolerance will decrease Outcome: Progressing   Problem: Nutrition: Goal: Adequate nutrition will be maintained Outcome: Progressing   Problem: Clinical Measurements: Goal: Ability to maintain clinical measurements within normal limits will improve Outcome: Progressing Goal: Will remain free from infection Outcome: Progressing Goal: Diagnostic test results will improve Outcome: Progressing   Problem: Activity: Goal: Risk for activity intolerance will decrease Outcome: Progressing   Problem: Nutrition: Goal: Adequate nutrition will be maintained Outcome: Progressing   Problem: Health Behavior/Discharge Planning: Goal: Ability to manage health-related needs will improve Outcome: Not Progressing Note: Confusion   Problem: Nutrition: Goal: Risk of aspiration will decrease Outcome: Progressing Goal: Dietary intake will improve Outcome: Progressing   Problem: Ischemic Stroke/TIA Tissue Perfusion: Goal: Complications of ischemic stroke/TIA will be minimized Outcome: Progressing   

## 2019-08-29 NOTE — Progress Notes (Signed)
   08/29/19 2124  MEWS Score  Resp 17  Pulse Rate 66  BP 125/87  Temp 98.1 F (36.7 C)  SpO2 100 %  O2 Device Room Air  MEWS Score  MEWS RR 0  MEWS Pulse 0  MEWS Systolic 0  MEWS LOC 0  MEWS Temp 0  MEWS Score 0  MEWS Score Color Green  MEWS Assessment  Is this an acute change? No  MEWS Guidelines - (patients age 50 and over)  Red - At High Risk for Deterioration Yellow - At risk for Deterioration  1. Go to room and assess patient 2. Validate data. Is this patient's baseline? If data confirmed: 3. Is this an acute change? 4. Administer prn meds/treatments as ordered. 5. Note Sepsis score 6. Review goals of care 7. Sports coach, RRT nurse and Provider. 8. Ask Provider to come to bedside.  9. Document patient condition/interventions/response. 10. Increase frequency of vital signs and focused assessments to at least q15 minutes x 4, then q30 minutes x2. - If stable, then q1h x3, then q4h x3 and then q8h or dept. routine. - If unstable, contact Provider & RRT nurse. Prepare for possible transfer. 11. Add entry in progress notes using the smart phrase ".MEWS". 1. Go to room and assess patient 2. Validate data. Is this patient's baseline? If data confirmed: 3. Is this an acute change? 4. Administer prn meds/treatments as ordered? 5. Note Sepsis score 6. Review goals of care 7. Sports coach and Provider 8. Call RRT nurse as needed. 9. Document patient condition/interventions/response. 10. Increase frequency of vital signs and focused assessments to at least q2h x2. - If stable, then q4h x2 and then q8h or dept. routine. - If unstable, contact Provider & RRT nurse. Prepare for possible transfer. 11. Add entry in progress notes using the smart phrase ".MEWS".  Green - Likely stable [place] - Comfort Care Only  1. Continue routine/ordered monitoring.  2. Review goals of care. 1. Continue routine/ordered monitoring. 2. Review goals of care.

## 2019-08-29 NOTE — Progress Notes (Signed)
PROGRESS NOTE    Tyler [name]  XBD:[id number] DOB: 1971-05-16 DOA: 07/23/2019 PCP: Patient, No Pcp Per   Brief Narrative:  Patient is a 48 year old male with history of polysubstance abuse including heroin presented to [facility] ER after being found down with a syringe nearby. He was given Narcan with minimal improvement. Found in severe rhabdomyolysis with acute kidney injury and hyperkalemia. He was started on IV fluids including bicarb drip and transferred to [facility] for further care. Rhabdomyolysis improved but he had persistence of encephalopathy and was found to have acute bilateral cerebellar infarct. Neurology was consulted and has signed off. PT recommends SNF placement.   Assessment & Plan:   Principal Problem:   Rhabdomyolysis Active Problems:   Acute kidney failure (HCC)   Acute metabolic encephalopathy   Polysubstance abuse (HCC)   Hyperkalemia   Transaminitis   HTN (hypertension)   Cerebral embolism with cerebral infarction   Protein-calorie malnutrition, severe   Acute metabolic encephalopathy: Multifactorial -Most likely secondary CVA, drug use, uremia, heavy alcohol use He is alert and awake but not oriented -CT of the head and neck was initially negative for any traumatic finding -UDS, Tylenol and salicylate levels were negative. -TSH, folate, B12 levels all within normal limits -EEG: Generalized diffuse slowing but no seizure activity -Had required intermittent restraints initially during the hospitalization. -Fall precautions -Awaiting SNF placement.Difficult placement   Acute bilateral CVA, nonhemorrhagic -MRI of the brain showed acute CVA -Neurology evaluated the patient and has signed off. MRA of the head negative. Ultrasound carotid showed bilateral 1 to 39% ICA stenosis -Neurology recommends considering 30-day cardiac event monitoring as outpatient to rule out A. fib if patient neurologically improves. -LDL 83; A1c 5.1. -Continue  aspirin, LFTs slowly improving.  Will start statin once LFTs normalized.Will check LFT in next few days. -Echocardiogram: EF of 60 to 65% -Lower extremity Dopplers: Negative for DVT - PT recommends SNF.  Severe rhabdomyolysis -AKI has resolved -Renal ultrasound negative -Monitor intermittently. Off IV fluids  Transaminitis with elevated bilirubin - Slowly improving -Right upper quadrant ultrasound showed hepatic steatosis with early cirrhosis with some gallbladder thickening -Monitor intermittently.  Alcohol abuse -Advised to quit -Continue thiamine, multivitamin and folic acid.  Urinary tract infection -Present on admission -Completed 5 days of Rocephin  Essential hypertension -Continue metoprolol -Continue to monitor blood pressure and adjust medications as needed.  Severe malnutrition -Poor oral intake -Follow nutrition recommendations   Nutrition Problem: Severe Malnutrition Etiology: acute illness(acute toxic/metabolic encephalopathy)      DVT prophylaxis: Lovenox Code Status: Full Family Communication: None present at the bed side Disposition Plan: SNF as soon as bed is available.Difficult placement   Consultants: Neurology  Procedures:None  Antimicrobials:  Anti-infectives (From admission, onward)   Start     Dose/Rate Route Frequency Ordered Stop   07/25/19 0745  cefTRIAXone (ROCEPHIN) 1 g in sodium chloride 0.9 % 100 mL IVPB     1 g 200 mL/hr over 30 Minutes Intravenous Every 24 hours 07/25/19 0734 07/29/19 1518      Subjective:  Patient seen at the bedside this morning.  Was sleeping.  No active issues. Objective: Vitals:   08/28/19 2115 08/28/19 2148 08/29/19 0500 08/29/19 0615  BP: 103/81 105/67  (!) 153/98  Pulse: 71 67  64  Resp: 18   18  Temp: 98.3 F (36.8 C)   97.9 F (36.6 C)  TempSrc: Oral   Oral  SpO2: 100% 100%  100%  Weight:   60.9 kg   Height:  Intake/Output Summary (Last 24 hours) at 08/29/2019 1055 Last  data filed at 08/29/2019 0330 Gross per 24 hour  Intake 120 ml  Output 1000 ml  Net -880 ml   Filed Weights   08/27/19 0428 08/28/19 0411 08/29/19 0500  Weight: 60.5 kg 62.4 kg 60.9 kg    Examination: General exam: Appears calm and comfortable ,Not in distress,average built    Data Reviewed: I have personally reviewed following labs and imaging studies  CBC: Recent Labs  Lab 08/23/19 0228 08/25/19 0303  WBC 6.4 7.3  HGB 13.0 12.8*  HCT 40.7 38.1*  MCV 97.1 95.5  PLT 304 269   Basic Metabolic Panel: Recent Labs  Lab 08/23/19 0228 08/25/19 0303  NA 138 139  K 4.4 3.8  CL 103 104  CO2 27 24  GLUCOSE 100* 71  BUN 30* 31*  CREATININE 1.33* 1.16  CALCIUM 9.6 9.7   GFR: Estimated Creatinine Clearance: 65.6 mL/min (by C-G formula based on SCr of 1.16 mg/dL). Liver Function Tests: Recent Labs  Lab 08/23/19 0228 08/25/19 0303  AST 101* 75*  ALT 141* 108*  ALKPHOS 80 73  BILITOT 0.6 0.8  PROT 8.0 7.7  ALBUMIN 3.7 3.5   No results for input(s): LIPASE, AMYLASE in the last 168 hours. No results for input(s): AMMONIA in the last 168 hours. Coagulation Profile: No results for input(s): INR, PROTIME in the last 168 hours. Cardiac Enzymes: No results for input(s): CKTOTAL, CKMB, CKMBINDEX, TROPONINI in the last 168 hours. BNP (last 3 results) No results for input(s): PROBNP in the last 8760 hours. HbA1C: No results for input(s): HGBA1C in the last 72 hours. CBG: Recent Labs  Lab 08/28/19 0757 08/28/19 1206 08/28/19 1715 08/28/19 2118 08/29/19 0801  GLUCAP 74 118* 100* 116* 70   Lipid Profile: No results for input(s): CHOL, HDL, LDLCALC, TRIG, CHOLHDL, LDLDIRECT in the last 72 hours. Thyroid Function Tests: No results for input(s): TSH, T4TOTAL, FREET4, T3FREE, THYROIDAB in the last 72 hours. Anemia Panel: No results for input(s): VITAMINB12, FOLATE, FERRITIN, TIBC, IRON, RETICCTPCT in the last 72 hours. Sepsis Labs: No results for input(s):  PROCALCITON, LATICACIDVEN in the last 168 hours.  Recent Results (from the past 240 hour(s))  MRSA PCR Screening     Status: None   Collection Time: 08/25/19 12:55 PM   Specimen: Nasopharyngeal  Result Value Ref Range Status   MRSA by PCR NEGATIVE NEGATIVE Final    Comment:        The GeneXpert MRSA Assay (FDA approved for NASAL specimens only), is one component of a comprehensive MRSA colonization surveillance program. It is not intended to diagnose MRSA infection nor to guide or monitor treatment for MRSA infections. Performed at [facility], [address], [place], [place] [address]          Radiology Studies: No results found.      Scheduled Meds: .  stroke: mapping our early stages of recovery book   Does not apply Once  . aspirin  300 mg Rectal Daily   Or  . aspirin  325 mg Oral Daily  . enoxaparin (LOVENOX) injection  40 mg Subcutaneous Q24H  . feeding supplement (ENSURE ENLIVE)  237 mL Oral BID BM  . folic acid  1 mg Oral Daily  . hydrALAZINE  25 mg Oral Once  . insulin aspart  0-15 Units Subcutaneous TID WC  . insulin aspart  0-5 Units Subcutaneous QHS  . lactulose  20 g Oral BID   Or  .  lactulose  300 mL Rectal BID  . mouth rinse  15 mL Mouth Rinse BID  . metoprolol tartrate  25 mg Oral BID  . multivitamin with minerals  1 tablet Oral Daily  . thiamine  100 mg Oral Daily   Or  . thiamine  100 mg Intravenous Daily   Continuous Infusions:   LOS: 37 days    Time spent: 25 mins.More than 50% of that time was spent in counseling and/or coordination of care.      Burnadette Pop, MD [facility] Pager [phone]  If 7PM-7AM, please contact night-coverage [email or web address] Password [facility] 08/29/2019, 10:55 AM

## 2019-08-29 NOTE — Progress Notes (Signed)
Occupational Therapy Treatment Patient Details Name: Tyler [name] MRN: [id number] DOB: August 21, 1971 Today's Date: 08/29/2019    History of present illness Pt is a 48 y/o male transferred from [place] [facility] for unresponsiveness, likely secondary to substance abuse. Pt with rhabdomyolysis and AKI. EEG revealed moderate diffuse encephalopathy. PMH includes polysubstance abuse and HTN.    OT comments  Pt progressing towards OT goals this session. Ability to perform transfers with Rollator improving to min A, Pt continues to present with cognitive deficits which greatly impact his ability to perform ADL - requiring mod A for standing grooming tasks including multimodal cues - he also requires support for balance in standing at all times from the rollator or by leaning against the sink. Current POC remains appropriate.    Follow Up Recommendations  SNF;Supervision/Assistance - 24 hour    Equipment Recommendations  Other (comment)(defer to next venue of care)    Recommendations for Other Services      Precautions / Restrictions Precautions Precautions: Fall Precaution Comments: poor safety awareness Restrictions Weight Bearing Restrictions: No       Mobility Bed Mobility               General bed mobility comments: Pt up in chair  Transfers Overall transfer level: Needs assistance Equipment used: 4-wheeled walker Transfers: Sit to/from Stand Sit to Stand: Min assist         General transfer comment: Assist for balance and to bring hips up, cues for safe hand placement    Balance Overall balance assessment: Needs assistance Sitting-balance support: No upper extremity supported;Feet supported Sitting balance-Leahy Scale: Fair     Standing balance support: Single extremity supported Standing balance-Leahy Scale: Poor Standing balance comment: UE support or leans against sink                           ADL either performed or assessed with clinical  judgement   ADL Overall ADL's : Needs assistance/impaired     Grooming: Wash/dry hands;Oral care;Moderate assistance;Standing Grooming Details (indicate cue type and reason): multimodal cues needed for initiation, sequencing, attention and successful completion             Lower Body Dressing: Maximal assistance;Sitting/lateral leans Lower Body Dressing Details (indicate cue type and reason): to don socks, required multimodal cues Toilet Transfer: Minimal assistance;Ambulation;RW;Cueing for safety;Cueing for sequencing   Toileting- Clothing Manipulation and Hygiene: Moderate assistance;Sit to/from stand       Functional mobility during ADLs: Minimal assistance;Rolling walker;Cueing for safety;Cueing for sequencing General ADL Comments: Pt. required extensive assist with ADL secondary to cognition.     Vision   Vision Assessment?: Vision impaired- to be further tested in functional context   Perception     Praxis      Cognition Arousal/Alertness: Awake/alert Behavior During Therapy: Flat affect Overall Cognitive Status: No family/caregiver present to determine baseline cognitive functioning Area of Impairment: Orientation;Memory;Following commands;Safety/judgement;Problem solving;Awareness                 Orientation Level: Disoriented to;Place;Time;Situation Current Attention Level: Sustained Memory: Decreased recall of precautions;Decreased short-term memory Following Commands: Follows one step commands consistently Safety/Judgement: Decreased awareness of safety;Decreased awareness of deficits Awareness: Intellectual Problem Solving: Slow processing;Requires verbal cues;Requires tactile cues;Difficulty sequencing;Decreased initiation          Exercises     Shoulder Instructions       General Comments      Pertinent Vitals/ Pain  Pain Assessment: No/denies pain  Home Living                                          Prior  Functioning/Environment              Frequency  Min 2X/week        Progress Toward Goals  OT Goals(current goals can now be found in the care plan section)  Progress towards OT goals: Progressing toward goals  Acute Rehab OT Goals Patient Stated Goal: none stated OT Goal Formulation: With patient Time For Goal Achievement: 09/12/19(goals updated today) Potential to Achieve Goals: [place] Discharge plan remains appropriate;Frequency remains appropriate    Co-evaluation                 AM-PAC OT "6 Clicks" Daily Activity     Outcome Measure   Help from another person eating meals?: A Little Help from another person taking care of personal grooming?: A Lot Help from another person toileting, which includes using toliet, bedpan, or urinal?: A Lot Help from another person bathing (including washing, rinsing, drying)?: A Lot Help from another person to put on and taking off regular upper body clothing?: A Lot Help from another person to put on and taking off regular lower body clothing?: A Lot 6 Click Score: 13    End of Session Equipment Utilized During Treatment: Rolling walker;Gait belt(Rollator)  OT Visit Diagnosis: Other abnormalities of gait and mobility (R26.89);Other symptoms and signs involving cognitive function;Other symptoms and signs involving the nervous system (R29.898);Muscle weakness (generalized) (M62.81)   Activity Tolerance Patient tolerated treatment well   Patient Left in chair;with call bell/phone within reach;with chair alarm set   Nurse Communication Mobility status        Time: 7544-9201 OT Time Calculation (min): 19 min  Charges: OT General Charges $OT Visit: 1 Visit OT Treatments $Self Care/Home Management : 8-22 mins  Hulda Humphrey OTR/L Acute Rehabilitation Services Pager: [phone] Office: [place] 08/29/2019, 4:28 PM

## 2019-08-30 LAB — GLUCOSE, CAPILLARY
Glucose-Capillary: 81 mg/dL (ref 70–99)
Glucose-Capillary: 118 mg/dL — ABNORMAL HIGH (ref 70–99)
Glucose-Capillary: 129 mg/dL — ABNORMAL HIGH (ref 70–99)
Glucose-Capillary: 139 mg/dL — ABNORMAL HIGH (ref 70–99)
Glucose-Capillary: 106 mg/dL — ABNORMAL HIGH (ref 70–99)

## 2019-08-30 NOTE — Plan of Care (Signed)
  Problem: Clinical Measurements: Goal: Ability to maintain clinical measurements within normal limits will improve Outcome: Progressing   Problem: Activity: Goal: Risk for activity intolerance will decrease Outcome: Progressing   Problem: Nutrition: Goal: Adequate nutrition will be maintained Outcome: Progressing   Problem: Clinical Measurements: Goal: Ability to maintain clinical measurements within normal limits will improve Outcome: Progressing Goal: Will remain free from infection Outcome: Progressing Goal: Diagnostic test results will improve Outcome: Progressing   Problem: Activity: Goal: Risk for activity intolerance will decrease Outcome: Progressing   Problem: Nutrition: Goal: Adequate nutrition will be maintained Outcome: Progressing   Problem: Health Behavior/Discharge Planning: Goal: Ability to manage health-related needs will improve Outcome: Not Progressing Note: Confusion   Problem: Nutrition: Goal: Risk of aspiration will decrease Outcome: Progressing Goal: Dietary intake will improve Outcome: Progressing   Problem: Ischemic Stroke/TIA Tissue Perfusion: Goal: Complications of ischemic stroke/TIA will be minimized Outcome: Progressing   

## 2019-08-30 NOTE — Progress Notes (Signed)
PROGRESS NOTE    Tyler [name]  [id number] DOB: 06/25/1971 DOA: 07/23/2019 PCP: Patient, No Pcp Per   Brief Narrative:  Patient is a 48 year old male with history of polysubstance abuse including heroin presented to [facility] ER after being found down with a syringe nearby. He was given Narcan with minimal improvement. Found in severe rhabdomyolysis with acute kidney injury and hyperkalemia. He was started on IV fluids including bicarb drip and transferred to [facility] for further care. Rhabdomyolysis improved but he had persistence of encephalopathy and was found to have acute bilateral cerebellar infarct. Neurology was consulted and has signed off. PT recommends SNF placement.   Assessment & Plan:   Principal Problem:   Rhabdomyolysis Active Problems:   Acute kidney failure (HCC)   Acute metabolic encephalopathy   Polysubstance abuse (HCC)   Hyperkalemia   Transaminitis   HTN (hypertension)   Cerebral embolism with cerebral infarction   Protein-calorie malnutrition, severe   Acute metabolic encephalopathy: Multifactorial -Most likely secondary CVA, drug use, uremia, heavy alcohol use He is alert and awake but not oriented -CT of the head and neck was initially negative for any traumatic finding -UDS, Tylenol and salicylate levels were negative. -TSH, folate, B12 levels all within normal limits -EEG: Generalized diffuse slowing but no seizure activity -Had required intermittent restraints initially during the hospitalization. -Fall precautions -Awaiting SNF placement.Difficult placement   Acute bilateral CVA, nonhemorrhagic -MRI of the brain showed acute CVA -Neurology evaluated the patient and has signed off. MRA of the head negative. Ultrasound carotid showed bilateral 1 to 39% ICA stenosis -Neurology recommends considering 30-day cardiac event monitoring as outpatient to rule out A. fib if patient neurologically improves. -LDL 83; A1c 5.1. -Continue  aspirin, LFTs slowly improving.  Will start statin once LFTs normalized.Will check LFT in next few days. -Echocardiogram: EF of 60 to 65% -Lower extremity Dopplers: Negative for DVT - PT recommends SNF.  Severe rhabdomyolysis -AKI has resolved -Renal ultrasound negative -Monitor intermittently. Off IV fluids  Transaminitis with elevated bilirubin - Slowly improving -Right upper quadrant ultrasound showed hepatic steatosis with early cirrhosis with some gallbladder thickening -Monitor intermittently.  Alcohol abuse -Advised to quit -Continue thiamine, multivitamin and folic acid.  Urinary tract infection -Present on admission -Completed 5 days of Rocephin  Essential hypertension -Continue metoprolol -Continue to monitor blood pressure and adjust medications as needed.  Severe malnutrition -Poor oral intake -Follow nutrition recommendations   Nutrition Problem: Severe Malnutrition Etiology: acute illness(acute toxic/metabolic encephalopathy)      DVT prophylaxis: Lovenox Code Status: Full Family Communication: None present at the bed side Disposition Plan: SNF as soon as bed is available.Difficult placement   Consultants: Neurology  Procedures:None  Antimicrobials:  Anti-infectives (From admission, onward)   Start     Dose/Rate Route Frequency Ordered Stop   07/25/19 0745  cefTRIAXone (ROCEPHIN) 1 g in sodium chloride 0.9 % 100 mL IVPB     1 g 200 mL/hr over 30 Minutes Intravenous Every 24 hours 07/25/19 0734 07/29/19 1518      Subjective:  Patient seen and examined the bedside this morning.  Hemodynamically stable.  Sitting on the bed and eating his breakfast.  No acute changes.  Objective: Vitals:   08/29/19 1134 08/29/19 1431 08/29/19 2124 08/30/19 0438  BP: (!) 139/96 113/86 125/87 123/80  Pulse: 86 81 66 64  Resp:  16 17 18   Temp:  98.9 F (37.2 C) 98.1 F (36.7 C) 97.8 F (36.6 C)  TempSrc:  Oral Oral Oral  SpO2:  99% 100% 100%   Weight:      Height:        Intake/Output Summary (Last 24 hours) at 08/30/2019 1126 Last data filed at 08/30/2019 1024 Gross per 24 hour  Intake -  Output 500 ml  Net -500 ml   Filed Weights   08/27/19 0428 08/28/19 0411 08/29/19 0500  Weight: 60.5 kg 62.4 kg 60.9 kg    Examination: General exam: Appears calm and comfortable ,Not in distress,average built HEENT:PERRL,Oral mucosa moist, Ear/Nose normal on gross exam Respiratory system: Bilateral equal air entry, normal vesicular breath sounds, no wheezes or crackles  Cardiovascular system: S1 & S2 heard, RRR. No JVD, murmurs, rubs, gallops or clicks. Gastrointestinal system: Abdomen is nondistended, soft and nontender. No organomegaly or masses felt. Normal bowel sounds heard. Central nervous system: Alert and awake but not oriented Extremities: No edema, no clubbing ,no cyanosis, distal peripheral pulses palpable. Skin: No rashes, lesions or ulcers,no icterus ,no pallor MSK: Normal muscle bulk,tone ,power Psychiatry: Judgement and insight appear impaired    Data Reviewed: I have personally reviewed following labs and imaging studies  CBC: Recent Labs  Lab 08/25/19 0303  WBC 7.3  HGB 12.8*  HCT 38.1*  MCV 95.5  PLT 269   Basic Metabolic Panel: Recent Labs  Lab 08/25/19 0303  NA 139  K 3.8  CL 104  CO2 24  GLUCOSE 71  BUN 31*  CREATININE 1.16  CALCIUM 9.7   GFR: Estimated Creatinine Clearance: 65.6 mL/min (by C-G formula based on SCr of 1.16 mg/dL). Liver Function Tests: Recent Labs  Lab 08/25/19 0303  AST 75*  ALT 108*  ALKPHOS 73  BILITOT 0.8  PROT 7.7  ALBUMIN 3.5   No results for input(s): LIPASE, AMYLASE in the last 168 hours. No results for input(s): AMMONIA in the last 168 hours. Coagulation Profile: No results for input(s): INR, PROTIME in the last 168 hours. Cardiac Enzymes: No results for input(s): CKTOTAL, CKMB, CKMBINDEX, TROPONINI in the last 168 hours. BNP (last 3 results) No  results for input(s): PROBNP in the last 8760 hours. HbA1C: No results for input(s): HGBA1C in the last 72 hours. CBG: Recent Labs  Lab 08/29/19 0801 08/29/19 1154 08/29/19 1713 08/29/19 2121 08/30/19 0826  GLUCAP 70 177* 81 99 81   Lipid Profile: No results for input(s): CHOL, HDL, LDLCALC, TRIG, CHOLHDL, LDLDIRECT in the last 72 hours. Thyroid Function Tests: No results for input(s): TSH, T4TOTAL, FREET4, T3FREE, THYROIDAB in the last 72 hours. Anemia Panel: No results for input(s): VITAMINB12, FOLATE, FERRITIN, TIBC, IRON, RETICCTPCT in the last 72 hours. Sepsis Labs: No results for input(s): PROCALCITON, LATICACIDVEN in the last 168 hours.  Recent Results (from the past 240 hour(s))  MRSA PCR Screening     Status: None   Collection Time: 08/25/19 12:55 PM   Specimen: Nasopharyngeal  Result Value Ref Range Status   MRSA by PCR NEGATIVE NEGATIVE Final    Comment:        The GeneXpert MRSA Assay (FDA approved for NASAL specimens only), is one component of a comprehensive MRSA colonization surveillance program. It is not intended to diagnose MRSA infection nor to guide or monitor treatment for MRSA infections. Performed at [facility], [address], [place], [place] [address]          Radiology Studies: No results found.      Scheduled Meds: .  stroke: mapping our early stages of recovery book   Does not apply Once  .  aspirin  300 mg Rectal Daily   Or  . aspirin  325 mg Oral Daily  . enoxaparin (LOVENOX) injection  40 mg Subcutaneous Q24H  . feeding supplement (ENSURE ENLIVE)  237 mL Oral BID BM  . folic acid  1 mg Oral Daily  . hydrALAZINE  25 mg Oral Once  . insulin aspart  0-15 Units Subcutaneous TID WC  . insulin aspart  0-5 Units Subcutaneous QHS  . lactulose  20 g Oral BID   Or  . lactulose  300 mL Rectal BID  . mouth rinse  15 mL Mouth Rinse BID  . metoprolol tartrate  25 mg Oral BID  . multivitamin with minerals  1 tablet Oral  Daily  . thiamine  100 mg Oral Daily   Or  . thiamine  100 mg Intravenous Daily   Continuous Infusions:   LOS: 38 days    Time spent: 25 mins.More than 50% of that time was spent in counseling and/or coordination of care.      Shelly Coss, MD [facility] Pager [phone]  If 7PM-7AM, please contact night-coverage [email or web address] Password [facility] 08/30/2019, 11:26 AM

## 2019-08-30 NOTE — Progress Notes (Signed)
   08/30/19 2246  MEWS Score  Resp 16  Pulse Rate 82  BP 109/75  Temp 98 F (36.7 C)  SpO2 96 %  O2 Device Room Air  MEWS Score  MEWS RR 0  MEWS Pulse 0  MEWS Systolic 0  MEWS LOC 0  MEWS Temp 0  MEWS Score 0  MEWS Score Color Green  MEWS Assessment  Is this an acute change? No  MEWS Guidelines - (patients age 50 and over)  Red - At High Risk for Deterioration Yellow - At risk for Deterioration  1. Go to room and assess patient 2. Validate data. Is this patient's baseline? If data confirmed: 3. Is this an acute change? 4. Administer prn meds/treatments as ordered. 5. Note Sepsis score 6. Review goals of care 7. Sports coach, RRT nurse and Provider. 8. Ask Provider to come to bedside.  9. Document patient condition/interventions/response. 10. Increase frequency of vital signs and focused assessments to at least q15 minutes x 4, then q30 minutes x2. - If stable, then q1h x3, then q4h x3 and then q8h or dept. routine. - If unstable, contact Provider & RRT nurse. Prepare for possible transfer. 11. Add entry in progress notes using the smart phrase ".MEWS". 1. Go to room and assess patient 2. Validate data. Is this patient's baseline? If data confirmed: 3. Is this an acute change? 4. Administer prn meds/treatments as ordered? 5. Note Sepsis score 6. Review goals of care 7. Sports coach and Provider 8. Call RRT nurse as needed. 9. Document patient condition/interventions/response. 10. Increase frequency of vital signs and focused assessments to at least q2h x2. - If stable, then q4h x2 and then q8h or dept. routine. - If unstable, contact Provider & RRT nurse. Prepare for possible transfer. 11. Add entry in progress notes using the smart phrase ".MEWS".  Green - Likely stable [place] - Comfort Care Only  1. Continue routine/ordered monitoring.  2. Review goals of care. 1. Continue routine/ordered monitoring. 2. Review goals of care.

## 2019-08-31 LAB — GLUCOSE, CAPILLARY
Glucose-Capillary: 78 mg/dL (ref 70–99)
Glucose-Capillary: 110 mg/dL — ABNORMAL HIGH (ref 70–99)
Glucose-Capillary: 143 mg/dL — ABNORMAL HIGH (ref 70–99)
Glucose-Capillary: 138 mg/dL — ABNORMAL HIGH (ref 70–99)

## 2019-08-31 NOTE — Progress Notes (Signed)
Physical Therapy Treatment Patient Details Name: Tyler [name] MRN: [id number] DOB: 10/04/1971 Today's Date: 08/31/2019    History of Present Illness 48 y/o male transferred from [place] [facility] for unresponsiveness, likely secondary to substance abuse. Pt with rhabdomyolysis and AKI. EEG revealed moderate diffuse encephalopathy. PMH includes polysubstance abuse and HTN.     PT Comments    Pt is up to walk with PT on RW for more stability, struggling to control walker with him due to apparent L vis field deficit.  Has been up in chair in the  Past but declined today.  No reason for not sitting up was elicited from pt.  PT is making some headway with respect to distances to walk but is still just as in need of help for maneuvering and controlling balance on RW device.  Follow up with rehab to work on obstacle awareness and to cue safety in all situations.  Work on same acutely.   Follow Up Recommendations  SNF     Equipment Recommendations  None recommended by PT    Recommendations for Other Services       Precautions / Restrictions Precautions Precautions: Fall Precaution Comments: poor safety awareness Restrictions Weight Bearing Restrictions: No    Mobility  Bed Mobility Overal bed mobility: Needs Assistance Bed Mobility: Supine to Sit;Sit to Supine     Supine to sit: Supervision Sit to supine: Min guard      Transfers Overall transfer level: Needs assistance Equipment used: Rolling walker (2 wheeled) Transfers: Sit to/from Stand(impulsive) Sit to Stand: Supervision         General transfer comment: once up requires min guard for control of balance but is able to launch with no help  Ambulation/Gait Ambulation/Gait assistance: Min guard;Min assist Gait Distance (Feet): 180 Feet Assistive device: Rolling walker (2 wheeled);1 person hand held assist Gait Pattern/deviations: Step-through pattern;Decreased stride length Gait velocity: variable Gait velocity  interpretation: <1.8 ft/sec, indicate of risk for recurrent falls General Gait Details: assist for balance and to negotiate obstacles. Pt frequently running into objects on lt side   Stairs             Wheelchair Mobility    Modified Rankin (Stroke Patients Only)       Balance     Sitting balance-Leahy Scale: Fair       Standing balance-Leahy Scale: Poor Standing balance comment: poor due to awareness of space and how to steady himself                            Cognition Arousal/Alertness: Awake/alert Behavior During Therapy: Flat affect Overall Cognitive Status: No family/caregiver present to determine baseline cognitive functioning Area of Impairment: Attention;Memory;Following commands;Problem solving;Awareness;Safety/judgement                   Current Attention Level: Selective Memory: Decreased recall of precautions;Decreased short-term memory Following Commands: Follows one step commands inconsistently;Follows one step commands with increased time Safety/Judgement: Decreased awareness of safety;Decreased awareness of deficits Awareness: Intellectual Problem Solving: Slow processing;Requires verbal cues General Comments: vision appears off but pt cannot acknowledge      Exercises      General Comments General comments (skin integrity, edema, etc.): pt was walked with steady cues for finding his path on the hall, very poor awareness of L side and obstacle clearance.  Pt is cognitively better but cannot correct his walker at all even to the right      Pertinent  Vitals/Pain Pain Assessment: No/denies pain    Home Living                      Prior Function            PT Goals (current goals can now be found in the care plan section) Acute Rehab PT Goals Patient Stated Goal: none stated Progress towards PT goals: Progressing toward goals    Frequency    Min 2X/week      PT Plan Current plan remains appropriate     Co-evaluation              AM-PAC PT "6 Clicks" Mobility   Outcome Measure  Help needed turning from your back to your side while in a flat bed without using bedrails?: None Help needed moving from lying on your back to sitting on the side of a flat bed without using bedrails?: None Help needed moving to and from a bed to a chair (including a wheelchair)?: A Little Help needed standing up from a chair using your arms (e.g., wheelchair or bedside chair)?: A Little Help needed to walk in hospital room?: A Little Help needed climbing 3-5 steps with a railing? : Total 6 Click Score: 18    End of Session Equipment Utilized During Treatment: Gait belt Activity Tolerance: Other (comment)(limits of vision and safety awareness) Patient left: in bed;with call bell/phone within reach;with bed alarm set(refused OOB to chair) Nurse Communication: Mobility status PT Visit Diagnosis: Muscle weakness (generalized) (M62.81);Difficulty in walking, not elsewhere classified (R26.2);Other abnormalities of gait and mobility (R26.89)     Time: 1638-4665 PT Time Calculation (min) (ACUTE ONLY): 24 min  Charges:  $Gait Training: 8-22 mins $Therapeutic Activity: 8-22 mins                   Ramond Dial 08/31/2019, 1:17 PM   Mee Hives, PT MS Acute Rehab Dept. Number: [place]

## 2019-08-31 NOTE — Progress Notes (Signed)
PROGRESS NOTE    Tyler [name]  HWE:[id number] DOB: [birth year] DOA: 07/23/2019 PCP: Patient, No Pcp Per   Brief Narrative:  Patient is a 48 year old male with history of polysubstance abuse including heroin presented to [facility] ER after being found down with a syringe nearby. He was given Narcan with minimal improvement. Found in severe rhabdomyolysis with acute kidney injury and hyperkalemia. He was started on IV fluids including bicarb drip and transferred to [facility] for further care. Rhabdomyolysis improved but he had persistence of encephalopathy and was found to have acute bilateral cerebellar infarct. Neurology was consulted and has signed off. PT recommends SNF placement.   Assessment & Plan:   Principal Problem:   Rhabdomyolysis Active Problems:   Acute kidney failure (HCC)   Acute metabolic encephalopathy   Polysubstance abuse (HCC)   Hyperkalemia   Transaminitis   HTN (hypertension)   Cerebral embolism with cerebral infarction   Protein-calorie malnutrition, severe   Acute metabolic encephalopathy: Multifactorial -Most likely secondary CVA, drug use, uremia, heavy alcohol use He is alert and awake but not oriented -CT of the head and neck was initially negative for any traumatic finding -UDS, Tylenol and salicylate levels were negative. -TSH, folate, B12 levels all within normal limits -EEG: Generalized diffuse slowing but no seizure activity -Had required intermittent restraints initially during the hospitalization. -Fall precautions -Awaiting SNF placement.Difficult placement   Acute bilateral CVA, nonhemorrhagic -MRI of the brain showed acute CVA -Neurology evaluated the patient and has signed off. MRA of the head negative. Ultrasound carotid showed bilateral 1 to 39% ICA stenosis -Neurology recommends considering 30-day cardiac event monitoring as outpatient to rule out A. fib if patient neurologically improves. -LDL 83; A1c 5.1. -Continue  aspirin, LFTs slowly improving.  Will start statin once LFTs normalized.Will check LFT in next few days. -Echocardiogram: EF of 60 to 65% -Lower extremity Dopplers: Negative for DVT - PT recommends SNF.  Severe rhabdomyolysis -AKI has resolved -Renal ultrasound negative -Monitor intermittently. Off IV fluids  Transaminitis with elevated bilirubin - Slowly improving -Right upper quadrant ultrasound showed hepatic steatosis with early cirrhosis with some gallbladder thickening -Monitor intermittently.  Alcohol abuse -Advised to quit -Continue thiamine, multivitamin and folic acid.  Urinary tract infection -Present on admission -Completed 5 days of Rocephin  Essential hypertension -Continue metoprolol -Continue to monitor blood pressure and adjust medications as needed.  Severe malnutrition -Poor oral intake -Follow nutrition recommendations   Nutrition Problem: Severe Malnutrition Etiology: acute illness(acute toxic/metabolic encephalopathy)      DVT prophylaxis: Lovenox Code Status: Full Family Communication: None present at the bed side Disposition Plan: SNF as soon as bed is available.Difficult placement   Consultants: Neurology  Procedures:None  Antimicrobials:  Anti-infectives (From admission, onward)   Start     Dose/Rate Route Frequency Ordered Stop   07/25/19 0745  cefTRIAXone (ROCEPHIN) 1 g in sodium chloride 0.9 % 100 mL IVPB     1 g 200 mL/hr over 30 Minutes Intravenous Every 24 hours 07/25/19 0734 07/29/19 1518      Subjective:  Patient seen and examined the bedside this morning.  Hemodynamically stable.  Comfortable.  Denies any complaints.  No new changes.  Objective: Vitals:   08/30/19 0438 08/30/19 1213 08/30/19 2246 08/31/19 0532  BP: 123/80 115/78 109/75 124/90  Pulse: 64 63 82 63  Resp: 18 16 16 18   Temp: 97.8 F (36.6 C) 97.9 F (36.6 C) 98 F (36.7 C) 98.3 F (36.8 C)  TempSrc: Oral Oral Oral Oral  SpO2: 100%  99% 96%  100%  Weight:      Height:        Intake/Output Summary (Last 24 hours) at 08/31/2019 1150 Last data filed at 08/31/2019 0900 Gross per 24 hour  Intake 480 ml  Output -  Net 480 ml   Filed Weights   08/27/19 0428 08/28/19 0411 08/29/19 0500  Weight: 60.5 kg 62.4 kg 60.9 kg    Examination: General exam: Appears calm and comfortable ,Not in distress,average built HEENT: Ear/Nose normal on gross exam Respiratory system: Bilaterally clear Cardiovascular system: S1 & S2 heard, RRR. No JVD, murmurs, rubs, gallops or clicks. Gastrointestinal system: Abdomen is nondistended, soft and nontender. No organomegaly or masses felt. Normal bowel sounds heard. Central nervous system: Alert and awake but not oriented Extremities: No edema, no clubbing ,no cyanosis, distal peripheral pulses palpable. Skin: No rashes, lesions or ulcers,no icterus ,no pallor Psychiatry: Judgement and insight appear impaired    Data Reviewed: I have personally reviewed following labs and imaging studies  CBC: Recent Labs  Lab 08/25/19 0303  WBC 7.3  HGB 12.8*  HCT 38.1*  MCV 95.5  PLT 269   Basic Metabolic Panel: Recent Labs  Lab 08/25/19 0303  NA 139  K 3.8  CL 104  CO2 24  GLUCOSE 71  BUN 31*  CREATININE 1.16  CALCIUM 9.7   GFR: Estimated Creatinine Clearance: 65.6 mL/min (by C-G formula based on SCr of 1.16 mg/dL). Liver Function Tests: Recent Labs  Lab 08/25/19 0303  AST 75*  ALT 108*  ALKPHOS 73  BILITOT 0.8  PROT 7.7  ALBUMIN 3.5   No results for input(s): LIPASE, AMYLASE in the last 168 hours. No results for input(s): AMMONIA in the last 168 hours. Coagulation Profile: No results for input(s): INR, PROTIME in the last 168 hours. Cardiac Enzymes: No results for input(s): CKTOTAL, CKMB, CKMBINDEX, TROPONINI in the last 168 hours. BNP (last 3 results) No results for input(s): PROBNP in the last 8760 hours. HbA1C: No results for input(s): HGBA1C in the last 72 hours. CBG:  Recent Labs  Lab 08/30/19 1152 08/30/19 1728 08/30/19 2126 08/30/19 2249 08/31/19 0753  GLUCAP 118* 129* 139* 106* 78   Lipid Profile: No results for input(s): CHOL, HDL, LDLCALC, TRIG, CHOLHDL, LDLDIRECT in the last 72 hours. Thyroid Function Tests: No results for input(s): TSH, T4TOTAL, FREET4, T3FREE, THYROIDAB in the last 72 hours. Anemia Panel: No results for input(s): VITAMINB12, FOLATE, FERRITIN, TIBC, IRON, RETICCTPCT in the last 72 hours. Sepsis Labs: No results for input(s): PROCALCITON, LATICACIDVEN in the last 168 hours.  Recent Results (from the past 240 hour(s))  MRSA PCR Screening     Status: None   Collection Time: 08/25/19 12:55 PM   Specimen: Nasopharyngeal  Result Value Ref Range Status   MRSA by PCR NEGATIVE NEGATIVE Final    Comment:        The GeneXpert MRSA Assay (FDA approved for NASAL specimens only), is one component of a comprehensive MRSA colonization surveillance program. It is not intended to diagnose MRSA infection nor to guide or monitor treatment for MRSA infections. Performed at [facility], [address], [place], [place] [address]          Radiology Studies: No results found.      Scheduled Meds: .  stroke: mapping our early stages of recovery book   Does not apply Once  . aspirin  300 mg Rectal Daily   Or  . aspirin  325 mg Oral Daily  .  enoxaparin (LOVENOX) injection  40 mg Subcutaneous Q24H  . feeding supplement (ENSURE ENLIVE)  237 mL Oral BID BM  . folic acid  1 mg Oral Daily  . hydrALAZINE  25 mg Oral Once  . insulin aspart  0-15 Units Subcutaneous TID WC  . insulin aspart  0-5 Units Subcutaneous QHS  . lactulose  20 g Oral BID   Or  . lactulose  300 mL Rectal BID  . mouth rinse  15 mL Mouth Rinse BID  . metoprolol tartrate  25 mg Oral BID  . multivitamin with minerals  1 tablet Oral Daily  . thiamine  100 mg Oral Daily   Or  . thiamine  100 mg Intravenous Daily   Continuous Infusions:   LOS: 39  days    Time spent: 25 mins.More than 50% of that time was spent in counseling and/or coordination of care.      Burnadette PopAmrit Mikeala Girdler, MD [facility] Pager [phone]  If 7PM-7AM, please contact night-coverage [email or web address] Password [email or web address] 08/31/2019, 11:50 AM

## 2019-08-31 NOTE — Progress Notes (Signed)
1110 Returned call to pt daughter -Halley Kincer and update on pt progress was given.

## 2019-09-01 LAB — CBC WITH DIFFERENTIAL/PLATELET
Abs Immature Granulocytes: 0.04 10*3/uL (ref 0.00–0.07)
Basophils Absolute: 0 10*3/uL (ref 0.0–0.1)
Basophils Relative: 1 %
Eosinophils Absolute: 0.3 10*3/uL (ref 0.0–0.5)
Eosinophils Relative: 5 %
HCT: 39 % (ref 39.0–52.0)
Hemoglobin: 12.8 g/dL — ABNORMAL LOW (ref 13.0–17.0)
Immature Granulocytes: 1 %
Lymphocytes Relative: 34 %
Lymphs Abs: 2.4 10*3/uL (ref 0.7–4.0)
MCH: 31.8 pg (ref 26.0–34.0)
MCHC: 32.8 g/dL (ref 30.0–36.0)
MCV: 97 fL (ref 80.0–100.0)
Monocytes Absolute: 0.8 10*3/uL (ref 0.1–1.0)
Monocytes Relative: 11 %
Neutro Abs: 3.4 10*3/uL (ref 1.7–7.7)
Neutrophils Relative %: 48 %
Platelets: 216 10*3/uL (ref 150–400)
RBC: 4.02 MIL/uL — ABNORMAL LOW (ref 4.22–5.81)
RDW: 14.6 % (ref 11.5–15.5)
WBC: 7 10*3/uL (ref 4.0–10.5)
nRBC: 0 % (ref 0.0–0.2)

## 2019-09-01 LAB — BASIC METABOLIC PANEL
Anion gap: 6 (ref 5–15)
BUN: 31 mg/dL — ABNORMAL HIGH (ref 6–20)
CO2: 26 mmol/L (ref 22–32)
Calcium: 9.1 mg/dL (ref 8.9–10.3)
Chloride: 108 mmol/L (ref 98–111)
Creatinine, Ser: 1.33 mg/dL — ABNORMAL HIGH (ref 0.61–1.24)
GFR calc Af Amer: >60 mL/min (ref >60)
GFR calc non Af Amer: >60 mL/min (ref >60)
Glucose, Bld: 90 mg/dL (ref 70–99)
Potassium: 4.6 mmol/L (ref 3.5–5.1)
Sodium: 140 mmol/L (ref 135–145)

## 2019-09-01 LAB — GLUCOSE, CAPILLARY
Glucose-Capillary: 79 mg/dL (ref 70–99)
Glucose-Capillary: 114 mg/dL — ABNORMAL HIGH (ref 70–99)
Glucose-Capillary: 92 mg/dL (ref 70–99)
Glucose-Capillary: 129 mg/dL — ABNORMAL HIGH (ref 70–99)

## 2019-09-01 NOTE — Progress Notes (Signed)
PROGRESS NOTE    Tyler [name]  ZOX:[id number] DOB: 02/01/1971 DOA: 07/23/2019 PCP: Patient, No Pcp Per   Brief Narrative:  Patient is a 48 year old male with history of polysubstance abuse including heroin presented to [facility] ER after being found down with a syringe nearby. He was given Narcan with minimal improvement. Found in severe rhabdomyolysis with acute kidney injury and hyperkalemia. He was started on IV fluids including bicarb drip and transferred to [facility] for further care. Rhabdomyolysis improved but he had persistence of encephalopathy and was found to have acute bilateral cerebellar infarct. Neurology was consulted and has signed off. PT recommends SNF placement.   Assessment & Plan:   Principal Problem:   Rhabdomyolysis Active Problems:   Acute kidney failure (HCC)   Acute metabolic encephalopathy   Polysubstance abuse (HCC)   Hyperkalemia   Transaminitis   HTN (hypertension)   Cerebral embolism with cerebral infarction   Protein-calorie malnutrition, severe   Acute metabolic encephalopathy: Multifactorial -Most likely secondary CVA, drug use, uremia, heavy alcohol use He is alert and awake but not oriented -CT of the head and neck was initially negative for any traumatic finding -UDS, Tylenol and salicylate levels were negative. -TSH, folate, B12 levels all within normal limits -EEG: Generalized diffuse slowing but no seizure activity -Had required intermittent restraints initially during the hospitalization. -Fall precautions -Awaiting SNF placement.Difficult placement   Acute bilateral CVA, nonhemorrhagic -MRI of the brain showed acute CVA -Neurology evaluated the patient and has signed off. MRA of the head negative. Ultrasound carotid showed bilateral 1 to 39% ICA stenosis -Neurology recommends considering 30-day cardiac event monitoring as outpatient to rule out A. fib if patient neurologically improves. -LDL 83; A1c 5.1. -Continue  aspirin, LFTs slowly improving.  Will start statin once LFTs normalized.Will check LFT in next few days. -Echocardiogram: EF of 60 to 65% -Lower extremity Dopplers: Negative for DVT - PT recommends SNF.  Severe rhabdomyolysis -AKI has resolved -Renal ultrasound negative -Monitor intermittently. Off IV fluids  Transaminitis with elevated bilirubin - Slowly improving -Right upper quadrant ultrasound showed hepatic steatosis with early cirrhosis with some gallbladder thickening -Monitor intermittently.  Alcohol abuse -Advised to quit -Continue thiamine, multivitamin and folic acid.  Urinary tract infection -Present on admission -Completed 5 days of Rocephin  Essential hypertension -Continue metoprolol -Continue to monitor blood pressure and adjust medications as needed.  Severe malnutrition -Poor oral intake -Follow nutrition recommendations   Nutrition Problem: Severe Malnutrition Etiology: acute illness(acute toxic/metabolic encephalopathy)      DVT prophylaxis: Lovenox Code Status: Full Family Communication: None present at the bed side Disposition Plan: SNF as soon as bed is available.Difficult placement   Consultants: Neurology  Procedures:None  Antimicrobials:  Anti-infectives (From admission, onward)   Start     Dose/Rate Route Frequency Ordered Stop   07/25/19 0745  cefTRIAXone (ROCEPHIN) 1 g in sodium chloride 0.9 % 100 mL IVPB     1 g 200 mL/hr over 30 Minutes Intravenous Every 24 hours 07/25/19 0734 07/29/19 1518      Subjective:  Patient seen at the bedside this morning.  Sleeping on the bed.  No new issues   Objective: Vitals:   08/31/19 2223 09/01/19 0500 09/01/19 0536 09/01/19 0953  BP:   (!) 165/120 (!) 131/92  Pulse: 60  65 76  Resp:    14  Temp:   97.7 F (36.5 C)   TempSrc:   Oral   SpO2:   100% 98%  Weight:  63 kg  Height:        Intake/Output Summary (Last 24 hours) at 09/01/2019 1135 Last data filed at 09/01/2019  1102 Gross per 24 hour  Intake 1197 ml  Output 950 ml  Net 247 ml   Filed Weights   08/28/19 0411 08/29/19 0500 09/01/19 0500  Weight: 62.4 kg 60.9 kg 63 kg    Examination: General exam: Appears calm and comfortable ,Not in distress,sleeping   Data Reviewed: I have personally reviewed following labs and imaging studies  CBC: Recent Labs  Lab 09/01/19 0241  WBC 7.0  NEUTROABS 3.4  HGB 12.8*  HCT 39.0  MCV 97.0  PLT 216   Basic Metabolic Panel: Recent Labs  Lab 09/01/19 0241  NA 140  K 4.6  CL 108  CO2 26  GLUCOSE 90  BUN 31*  CREATININE 1.33*  CALCIUM 9.1   GFR: Estimated Creatinine Clearance: 59.2 mL/min (A) (by C-G formula based on SCr of 1.33 mg/dL (H)). Liver Function Tests: No results for input(s): AST, ALT, ALKPHOS, BILITOT, PROT, ALBUMIN in the last 168 hours. No results for input(s): LIPASE, AMYLASE in the last 168 hours. No results for input(s): AMMONIA in the last 168 hours. Coagulation Profile: No results for input(s): INR, PROTIME in the last 168 hours. Cardiac Enzymes: No results for input(s): CKTOTAL, CKMB, CKMBINDEX, TROPONINI in the last 168 hours. BNP (last 3 results) No results for input(s): PROBNP in the last 8760 hours. HbA1C: No results for input(s): HGBA1C in the last 72 hours. CBG: Recent Labs  Lab 08/31/19 0753 08/31/19 1156 08/31/19 1612 08/31/19 2116 09/01/19 0835  GLUCAP 78 110* 143* 138* 79   Lipid Profile: No results for input(s): CHOL, HDL, LDLCALC, TRIG, CHOLHDL, LDLDIRECT in the last 72 hours. Thyroid Function Tests: No results for input(s): TSH, T4TOTAL, FREET4, T3FREE, THYROIDAB in the last 72 hours. Anemia Panel: No results for input(s): VITAMINB12, FOLATE, FERRITIN, TIBC, IRON, RETICCTPCT in the last 72 hours. Sepsis Labs: No results for input(s): PROCALCITON, LATICACIDVEN in the last 168 hours.  Recent Results (from the past 240 hour(s))  MRSA PCR Screening     Status: None   Collection Time: 08/25/19  12:55 PM   Specimen: Nasopharyngeal  Result Value Ref Range Status   MRSA by PCR NEGATIVE NEGATIVE Final    Comment:        The GeneXpert MRSA Assay (FDA approved for NASAL specimens only), is one component of a comprehensive MRSA colonization surveillance program. It is not intended to diagnose MRSA infection nor to guide or monitor treatment for MRSA infections. Performed at [facility], [address] [place], [place] [address]          Radiology Studies: No results found.      Scheduled Meds: .  stroke: mapping our early stages of recovery book   Does not apply Once  . aspirin  300 mg Rectal Daily   Or  . aspirin  325 mg Oral Daily  . enoxaparin (LOVENOX) injection  40 mg Subcutaneous Q24H  . feeding supplement (ENSURE ENLIVE)  237 mL Oral BID BM  . folic acid  1 mg Oral Daily  . hydrALAZINE  25 mg Oral Once  . insulin aspart  0-15 Units Subcutaneous TID WC  . insulin aspart  0-5 Units Subcutaneous QHS  . lactulose  20 g Oral BID   Or  . lactulose  300 mL Rectal BID  . mouth rinse  15 mL Mouth Rinse BID  . metoprolol tartrate  25 mg  Oral BID  . multivitamin with minerals  1 tablet Oral Daily  . thiamine  100 mg Oral Daily   Or  . thiamine  100 mg Intravenous Daily   Continuous Infusions:   LOS: 40 days    Time spent: 25 mins.More than 50% of that time was spent in counseling and/or coordination of care.      Shelly Coss, MD [facility] Pager [phone]  If 7PM-7AM, please contact night-coverage [email or web address] Password [email or web address] 09/01/2019, 11:35 AM

## 2019-09-02 LAB — GLUCOSE, CAPILLARY
Glucose-Capillary: 80 mg/dL (ref 70–99)
Glucose-Capillary: 109 mg/dL — ABNORMAL HIGH (ref 70–99)
Glucose-Capillary: 76 mg/dL (ref 70–99)
Glucose-Capillary: 83 mg/dL (ref 70–99)

## 2019-09-02 NOTE — Progress Notes (Signed)
PROGRESS NOTE    Tyler [name]  IOX:[id number] DOB: [birth year] DOA: 07/23/2019 PCP: Patient, No Pcp Per   Brief Narrative:  Patient is a 48 year old male with history of polysubstance abuse including heroin presented to [facility] ER after being found down with a syringe nearby. He was given Narcan with minimal improvement. Found in severe rhabdomyolysis with acute kidney injury and hyperkalemia. He was started on IV fluids including bicarb drip and transferred to [facility] for further care. Rhabdomyolysis improved but he had persistence of encephalopathy and was found to have acute bilateral cerebellar infarct. Neurology was consulted and has signed off. PT recommends SNF placement. Patient is medically stable for discharge to skilled nursing facility as soon as the bed is available.   Assessment & Plan:   Principal Problem:   Rhabdomyolysis Active Problems:   Acute kidney failure (HCC)   Acute metabolic encephalopathy   Polysubstance abuse (HCC)   Hyperkalemia   Transaminitis   HTN (hypertension)   Cerebral embolism with cerebral infarction   Protein-calorie malnutrition, severe   Acute metabolic encephalopathy: Multifactorial -Most likely secondary CVA, drug use, uremia, heavy alcohol use He is alert and awake but not oriented -CT of the head and neck was initially negative for any traumatic finding -UDS, Tylenol and salicylate levels were negative. -TSH, folate, B12 levels all within normal limits -EEG: Generalized diffuse slowing but no seizure activity -Had required intermittent restraints initially during the hospitalization. -Fall precautions -Awaiting SNF placement.Difficult placement   Acute bilateral CVA, nonhemorrhagic -MRI of the brain showed acute CVA -Neurology evaluated the patient and has signed off. MRA of the head negative. Ultrasound carotid showed bilateral 1 to 39% ICA stenosis -Neurology recommends considering 30-day cardiac event monitoring  as outpatient to rule out A. fib if patient neurologically improves. -LDL 83; A1c 5.1. -Continue aspirin, LFTs slowly improving.  Will start statin once LFTs normalized.Will check LFT in next few days. -Echocardiogram: EF of 60 to 65% -Lower extremity Dopplers: Negative for DVT - PT recommends SNF.  Severe rhabdomyolysis -AKI has resolved -Renal ultrasound negative -Monitor intermittently. Off IV fluids  Transaminitis with elevated bilirubin - Slowly improving -Right upper quadrant ultrasound showed hepatic steatosis with early cirrhosis with some gallbladder thickening -Monitor intermittently.  Alcohol abuse -Advised to quit -Continue thiamine, multivitamin and folic acid.  Urinary tract infection -Present on admission -Completed 5 days of Rocephin  Essential hypertension -Continue metoprolol -Continue to monitor blood pressure and adjust medications as needed.  Severe malnutrition -Poor oral intake -Follow nutrition recommendations   Nutrition Problem: Severe Malnutrition Etiology: acute illness(acute toxic/metabolic encephalopathy)      DVT prophylaxis: Lovenox Code Status: Full Family Communication: None present at the bed side Disposition Plan: SNF as soon as bed is available.Difficult placement   Consultants: Neurology  Procedures:None  Antimicrobials:  Anti-infectives (From admission, onward)   Start     Dose/Rate Route Frequency Ordered Stop   07/25/19 0745  cefTRIAXone (ROCEPHIN) 1 g in sodium chloride 0.9 % 100 mL IVPB     1 g 200 mL/hr over 30 Minutes Intravenous Every 24 hours 07/25/19 0734 07/29/19 1518      Subjective:  Patient seen and examined the bedside this morning.  Hemodynamically stable.  No new issues.  Objective: Vitals:   09/01/19 1321 09/01/19 2124 09/01/19 2218 09/02/19 0512  BP: (!) 137/98 93/69 93/69  91/66  Pulse: 70 63 63 60  Resp: 16 16  16   Temp: 98.1 F (36.7 C) 98 F (36.7 C)  98.2 F (36.8 C)  TempSrc:  Oral  Oral  SpO2: 93% 100%  100%  Weight:    61.1 kg  Height:        Intake/Output Summary (Last 24 hours) at 09/02/2019 1317 Last data filed at 09/02/2019 0533 Gross per 24 hour  Intake 237 ml  Output 300 ml  Net -63 ml   Filed Weights   08/29/19 0500 09/01/19 0500 09/02/19 0512  Weight: 60.9 kg 63 kg 61.1 kg    Examination: General exam: Appears calm and comfortable ,Not in distress,sleeping Answered questions and denied any complains   Data Reviewed: I have personally reviewed following labs and imaging studies  CBC: Recent Labs  Lab 09/01/19 0241  WBC 7.0  NEUTROABS 3.4  HGB 12.8*  HCT 39.0  MCV 97.0  PLT 216   Basic Metabolic Panel: Recent Labs  Lab 09/01/19 0241  NA 140  K 4.6  CL 108  CO2 26  GLUCOSE 90  BUN 31*  CREATININE 1.33*  CALCIUM 9.1   GFR: Estimated Creatinine Clearance: 57.4 mL/min (A) (by C-G formula based on SCr of 1.33 mg/dL (H)). Liver Function Tests: No results for input(s): AST, ALT, ALKPHOS, BILITOT, PROT, ALBUMIN in the last 168 hours. No results for input(s): LIPASE, AMYLASE in the last 168 hours. No results for input(s): AMMONIA in the last 168 hours. Coagulation Profile: No results for input(s): INR, PROTIME in the last 168 hours. Cardiac Enzymes: No results for input(s): CKTOTAL, CKMB, CKMBINDEX, TROPONINI in the last 168 hours. BNP (last 3 results) No results for input(s): PROBNP in the last 8760 hours. HbA1C: No results for input(s): HGBA1C in the last 72 hours. CBG: Recent Labs  Lab 09/01/19 1242 09/01/19 1659 09/01/19 2122 09/02/19 0744 09/02/19 1222  GLUCAP 114* 92 129* 80 109*   Lipid Profile: No results for input(s): CHOL, HDL, LDLCALC, TRIG, CHOLHDL, LDLDIRECT in the last 72 hours. Thyroid Function Tests: No results for input(s): TSH, T4TOTAL, FREET4, T3FREE, THYROIDAB in the last 72 hours. Anemia Panel: No results for input(s): VITAMINB12, FOLATE, FERRITIN, TIBC, IRON, RETICCTPCT in the last  72 hours. Sepsis Labs: No results for input(s): PROCALCITON, LATICACIDVEN in the last 168 hours.  Recent Results (from the past 240 hour(s))  MRSA PCR Screening     Status: None   Collection Time: 08/25/19 12:55 PM   Specimen: Nasopharyngeal  Result Value Ref Range Status   MRSA by PCR NEGATIVE NEGATIVE Final    Comment:        The GeneXpert MRSA Assay (FDA approved for NASAL specimens only), is one component of a comprehensive MRSA colonization surveillance program. It is not intended to diagnose MRSA infection nor to guide or monitor treatment for MRSA infections. Performed at [facility], [address] [place], [place] [address]          Radiology Studies: No results found.      Scheduled Meds: .  stroke: mapping our early stages of recovery book   Does not apply Once  . aspirin  300 mg Rectal Daily   Or  . aspirin  325 mg Oral Daily  . enoxaparin (LOVENOX) injection  40 mg Subcutaneous Q24H  . feeding supplement (ENSURE ENLIVE)  237 mL Oral BID BM  . folic acid  1 mg Oral Daily  . hydrALAZINE  25 mg Oral Once  . insulin aspart  0-15 Units Subcutaneous TID WC  . insulin aspart  0-5 Units Subcutaneous QHS  . lactulose  20 g Oral BID   Or  .  lactulose  300 mL Rectal BID  . mouth rinse  15 mL Mouth Rinse BID  . metoprolol tartrate  25 mg Oral BID  . multivitamin with minerals  1 tablet Oral Daily  . thiamine  100 mg Oral Daily   Or  . thiamine  100 mg Intravenous Daily   Continuous Infusions:   LOS: 41 days    Time spent: 25 mins.More than 50% of that time was spent in counseling and/or coordination of care.      Shelly Coss, MD [facility] Pager [phone]  If 7PM-7AM, please contact night-coverage [email or web address] Password [email or web address] 09/02/2019, 1:17 PM

## 2019-09-02 NOTE — Plan of Care (Signed)
  Problem: Clinical Measurements: Goal: Ability to maintain clinical measurements within normal limits will improve Outcome: Not Progressing Note: Confusion   Problem: Activity: Goal: Risk for activity intolerance will decrease Outcome: Progressing   Problem: Nutrition: Goal: Adequate nutrition will be maintained Outcome: Progressing   Problem: Clinical Measurements: Goal: Ability to maintain clinical measurements within normal limits will improve Outcome: Progressing Goal: Will remain free from infection Outcome: Progressing Goal: Diagnostic test results will improve Outcome: Progressing   Problem: Activity: Goal: Risk for activity intolerance will decrease Outcome: Progressing   Problem: Nutrition: Goal: Adequate nutrition will be maintained Outcome: Progressing   Problem: Health Behavior/Discharge Planning: Goal: Ability to manage health-related needs will improve Outcome: Not Progressing Note: Confusion   Problem: Nutrition: Goal: Risk of aspiration will decrease Outcome: Progressing Goal: Dietary intake will improve Outcome: Progressing   Problem: Ischemic Stroke/TIA Tissue Perfusion: Goal: Complications of ischemic stroke/TIA will be minimized Outcome: Progressing

## 2019-09-02 NOTE — Progress Notes (Signed)
   09/02/19 2126  MEWS Score  Resp 20  Pulse Rate 62  BP (!) 136/98  Temp 97.9 F (36.6 C)  SpO2 100 %  O2 Device Room Air  MEWS Score  MEWS RR 0  MEWS Pulse 0  MEWS Systolic 0  MEWS LOC 0  MEWS Temp 0  MEWS Score 0  MEWS Score Color Green  MEWS Assessment  Is this an acute change? No  MEWS Guidelines - (patients age 50 and over)  Red - At High Risk for Deterioration Yellow - At risk for Deterioration  1. Go to room and assess patient 2. Validate data. Is this patient's baseline? If data confirmed: 3. Is this an acute change? 4. Administer prn meds/treatments as ordered. 5. Note Sepsis score 6. Review goals of care 7. Sports coach, RRT nurse and Provider. 8. Ask Provider to come to bedside.  9. Document patient condition/interventions/response. 10. Increase frequency of vital signs and focused assessments to at least q15 minutes x 4, then q30 minutes x2. - If stable, then q1h x3, then q4h x3 and then q8h or dept. routine. - If unstable, contact Provider & RRT nurse. Prepare for possible transfer. 11. Add entry in progress notes using the smart phrase ".MEWS". 1. Go to room and assess patient 2. Validate data. Is this patient's baseline? If data confirmed: 3. Is this an acute change? 4. Administer prn meds/treatments as ordered? 5. Note Sepsis score 6. Review goals of care 7. Sports coach and Provider 8. Call RRT nurse as needed. 9. Document patient condition/interventions/response. 10. Increase frequency of vital signs and focused assessments to at least q2h x2. - If stable, then q4h x2 and then q8h or dept. routine. - If unstable, contact Provider & RRT nurse. Prepare for possible transfer. 11. Add entry in progress notes using the smart phrase ".MEWS".  Green - Likely stable Lavender - Comfort Care Only  1. Continue routine/ordered monitoring.  2. Review goals of care. 1. Continue routine/ordered monitoring. 2. Review goals of care.

## 2019-09-03 LAB — GLUCOSE, CAPILLARY
Glucose-Capillary: 77 mg/dL (ref 70–99)
Glucose-Capillary: 84 mg/dL (ref 70–99)
Glucose-Capillary: 94 mg/dL (ref 70–99)
Glucose-Capillary: 118 mg/dL — ABNORMAL HIGH (ref 70–99)

## 2019-09-03 NOTE — Plan of Care (Signed)
  Problem: Clinical Measurements: Goal: Ability to maintain clinical measurements within normal limits will improve Outcome: Progressing   Problem: Activity: Goal: Risk for activity intolerance will decrease Outcome: Progressing   Problem: Nutrition: Goal: Adequate nutrition will be maintained Outcome: Progressing   Problem: Clinical Measurements: Goal: Ability to maintain clinical measurements within normal limits will improve Outcome: Progressing Goal: Will remain free from infection Outcome: Progressing Goal: Diagnostic test results will improve Outcome: Progressing   Problem: Activity: Goal: Risk for activity intolerance will decrease Outcome: Progressing   Problem: Nutrition: Goal: Adequate nutrition will be maintained Outcome: Progressing   Problem: Health Behavior/Discharge Planning: Goal: Ability to manage health-related needs will improve Outcome: Not Progressing Note: Confusion   Problem: Nutrition: Goal: Risk of aspiration will decrease Outcome: Progressing Goal: Dietary intake will improve Outcome: Progressing   Problem: Ischemic Stroke/TIA Tissue Perfusion: Goal: Complications of ischemic stroke/TIA will be minimized Outcome: Progressing   

## 2019-09-03 NOTE — Progress Notes (Signed)
PROGRESS NOTE    Tyler [name]  PPI:[id number] DOB: December 15, 1970 DOA: 07/23/2019 PCP: Patient, No Pcp Per   Brief Narrative:  Patient is a 48 year old male with history of polysubstance abuse including heroin presented to [facility] ER after being found down with a syringe nearby. He was given Narcan with minimal improvement. Found in severe rhabdomyolysis with acute kidney injury and hyperkalemia. He was started on IV fluids including bicarb drip and transferred to [facility] for further care. Rhabdomyolysis improved but he had persistence of encephalopathy and was found to have acute bilateral cerebellar infarct. Neurology was consulted and has signed off. PT recommends SNF placement. Patient is medically stable for discharge to skilled nursing facility as soon as the bed is available.   Assessment & Plan:   Principal Problem:   Rhabdomyolysis Active Problems:   Acute kidney failure (HCC)   Acute metabolic encephalopathy   Polysubstance abuse (HCC)   Hyperkalemia   Transaminitis   HTN (hypertension)   Cerebral embolism with cerebral infarction   Protein-calorie malnutrition, severe   Acute metabolic encephalopathy: Multifactorial -Most likely secondary CVA, drug use, uremia, heavy alcohol use He is alert and awake but not oriented -CT of the head and neck was initially negative for any traumatic finding -UDS, Tylenol and salicylate levels were negative. -TSH, folate, B12 levels all within normal limits -EEG: Generalized diffuse slowing but no seizure activity -Had required intermittent restraints initially during the hospitalization. -Fall precautions -Awaiting SNF placement.Difficult placement   Acute bilateral CVA, nonhemorrhagic -MRI of the brain showed acute CVA -Neurology evaluated the patient and has signed off. MRA of the head negative. Ultrasound carotid showed bilateral 1 to 39% ICA stenosis -Neurology recommends considering 30-day cardiac event monitoring  as outpatient to rule out A. fib if patient neurologically improves. -LDL 83; A1c 5.1. -Continue aspirin, LFTs slowly improving.  Will start statin once LFTs normalized.Will check LFT in next few days. -Echocardiogram: EF of 60 to 65% -Lower extremity Dopplers: Negative for DVT - PT recommends SNF.  Severe rhabdomyolysis -AKI has resolved -Renal ultrasound negative -Monitor intermittently. Off IV fluids  Transaminitis with elevated bilirubin - Slowly improving -Right upper quadrant ultrasound showed hepatic steatosis with early cirrhosis with some gallbladder thickening -Monitor intermittently.  Alcohol abuse -Advised to quit -Continue thiamine, multivitamin and folic acid.  Urinary tract infection -Present on admission -Completed 5 days of Rocephin  Essential hypertension -Continue metoprolol -Continue to monitor blood pressure and adjust medications as needed.  Severe malnutrition -Poor oral intake -Follow nutrition recommendations   Nutrition Problem: Severe Malnutrition Etiology: acute illness(acute toxic/metabolic encephalopathy)      DVT prophylaxis: Lovenox Code Status: Full Family Communication: None present at the bed side Disposition Plan: SNF as soon as bed is available.Difficult placement   Consultants: Neurology  Procedures:None  Antimicrobials:  Anti-infectives (From admission, onward)   Start     Dose/Rate Route Frequency Ordered Stop   07/25/19 0745  cefTRIAXone (ROCEPHIN) 1 g in sodium chloride 0.9 % 100 mL IVPB     1 g 200 mL/hr over 30 Minutes Intravenous Every 24 hours 07/25/19 0734 07/29/19 1518      Subjective:  Patient seen and examined the bedside this morning.  Sleeping on bed .  No new issues.Deinied any complains.  Objective: Vitals:   09/02/19 2126 09/03/19 0607 09/03/19 0943 09/03/19 0950  BP: (!) 136/98 101/79 (!) 148/83 (!) 143/83  Pulse: 62 (!) 58 76 76  Resp: 20 18    Temp: 97.9 F (36.6 C) 98.9 F (  37.2  C) 98.4 F (36.9 C) 98.4 F (36.9 C)  TempSrc: Oral  Oral   SpO2: 100% 100% 100% 100%  Weight:      Height:        Intake/Output Summary (Last 24 hours) at 09/03/2019 1149 Last data filed at 09/03/2019 0900 Gross per 24 hour  Intake 480 ml  Output 975 ml  Net -495 ml   Filed Weights   08/29/19 0500 09/01/19 0500 09/02/19 0512  Weight: 60.9 kg 63 kg 61.1 kg    Examination: General exam: Appears calm and comfortable ,Not in distress,sleeping Answered questions and denied any complains   Data Reviewed: I have personally reviewed following labs and imaging studies  CBC: Recent Labs  Lab 09/01/19 0241  WBC 7.0  NEUTROABS 3.4  HGB 12.8*  HCT 39.0  MCV 97.0  PLT 216   Basic Metabolic Panel: Recent Labs  Lab 09/01/19 0241  NA 140  K 4.6  CL 108  CO2 26  GLUCOSE 90  BUN 31*  CREATININE 1.33*  CALCIUM 9.1   GFR: Estimated Creatinine Clearance: 57.4 mL/min (A) (by C-G formula based on SCr of 1.33 mg/dL (H)). Liver Function Tests: No results for input(s): AST, ALT, ALKPHOS, BILITOT, PROT, ALBUMIN in the last 168 hours. No results for input(s): LIPASE, AMYLASE in the last 168 hours. No results for input(s): AMMONIA in the last 168 hours. Coagulation Profile: No results for input(s): INR, PROTIME in the last 168 hours. Cardiac Enzymes: No results for input(s): CKTOTAL, CKMB, CKMBINDEX, TROPONINI in the last 168 hours. BNP (last 3 results) No results for input(s): PROBNP in the last 8760 hours. HbA1C: No results for input(s): HGBA1C in the last 72 hours. CBG: Recent Labs  Lab 09/02/19 0744 09/02/19 1222 09/02/19 1653 09/02/19 2125 09/03/19 0748  GLUCAP 80 109* 76 83 77   Lipid Profile: No results for input(s): CHOL, HDL, LDLCALC, TRIG, CHOLHDL, LDLDIRECT in the last 72 hours. Thyroid Function Tests: No results for input(s): TSH, T4TOTAL, FREET4, T3FREE, THYROIDAB in the last 72 hours. Anemia Panel: No results for input(s): VITAMINB12, FOLATE, FERRITIN,  TIBC, IRON, RETICCTPCT in the last 72 hours. Sepsis Labs: No results for input(s): PROCALCITON, LATICACIDVEN in the last 168 hours.  Recent Results (from the past 240 hour(s))  MRSA PCR Screening     Status: None   Collection Time: 08/25/19 12:55 PM   Specimen: Nasopharyngeal  Result Value Ref Range Status   MRSA by PCR NEGATIVE NEGATIVE Final    Comment:        The GeneXpert MRSA Assay (FDA approved for NASAL specimens only), is one component of a comprehensive MRSA colonization surveillance program. It is not intended to diagnose MRSA infection nor to guide or monitor treatment for MRSA infections. Performed at [facility], [address] [place], [place] [address]          Radiology Studies: No results found.      Scheduled Meds:   stroke: mapping our early stages of recovery book   Does not apply Once   aspirin  300 mg Rectal Daily   Or   aspirin  325 mg Oral Daily   enoxaparin (LOVENOX) injection  40 mg Subcutaneous Q24H   feeding supplement (ENSURE ENLIVE)  237 mL Oral BID BM   folic acid  1 mg Oral Daily   hydrALAZINE  25 mg Oral Once   insulin aspart  0-15 Units Subcutaneous TID WC   insulin aspart  0-5 Units Subcutaneous QHS  lactulose  20 g Oral BID   Or   lactulose  300 mL Rectal BID   mouth rinse  15 mL Mouth Rinse BID   metoprolol tartrate  25 mg Oral BID   multivitamin with minerals  1 tablet Oral Daily   thiamine  100 mg Oral Daily   Or   thiamine  100 mg Intravenous Daily   Continuous Infusions:   LOS: 42 days    Time spent: 25 mins.More than 50% of that time was spent in counseling and/or coordination of care.      Burnadette PopAmrit Aluel Schwarz, MD [facility] Pager [phone]  If 7PM-7AM, please contact night-coverage [email or web address] Password [email or web address] 09/03/2019, 11:49 AM

## 2019-09-03 NOTE — Progress Notes (Signed)
   09/03/19 2106  MEWS Score  Resp 18  Pulse Rate 70  BP 110/80  Temp 98.4 F (36.9 C)  SpO2 100 %  MEWS Score  MEWS RR 0  MEWS Pulse 0  MEWS Systolic 0  MEWS LOC 0  MEWS Temp 0  MEWS Score 0  MEWS Score Color Green  MEWS Assessment  Is this an acute change? No  MEWS Guidelines - (patients age 50 and over)  Red - At High Risk for Deterioration Yellow - At risk for Deterioration  1. Go to room and assess patient 2. Validate data. Is this patient's baseline? If data confirmed: 3. Is this an acute change? 4. Administer prn meds/treatments as ordered. 5. Note Sepsis score 6. Review goals of care 7. Sports coach, RRT nurse and Provider. 8. Ask Provider to come to bedside.  9. Document patient condition/interventions/response. 10. Increase frequency of vital signs and focused assessments to at least q15 minutes x 4, then q30 minutes x2. - If stable, then q1h x3, then q4h x3 and then q8h or dept. routine. - If unstable, contact Provider & RRT nurse. Prepare for possible transfer. 11. Add entry in progress notes using the smart phrase ".MEWS". 1. Go to room and assess patient 2. Validate data. Is this patient's baseline? If data confirmed: 3. Is this an acute change? 4. Administer prn meds/treatments as ordered? 5. Note Sepsis score 6. Review goals of care 7. Sports coach and Provider 8. Call RRT nurse as needed. 9. Document patient condition/interventions/response. 10. Increase frequency of vital signs and focused assessments to at least q2h x2. - If stable, then q4h x2 and then q8h or dept. routine. - If unstable, contact Provider & RRT nurse. Prepare for possible transfer. 11. Add entry in progress notes using the smart phrase ".MEWS".  Green - Likely stable Lavender - Comfort Care Only  1. Continue routine/ordered monitoring.  2. Review goals of care. 1. Continue routine/ordered monitoring. 2. Review goals of care.

## 2019-09-04 LAB — GLUCOSE, CAPILLARY
Glucose-Capillary: 69 mg/dL — ABNORMAL LOW (ref 70–99)
Glucose-Capillary: 94 mg/dL (ref 70–99)
Glucose-Capillary: 125 mg/dL — ABNORMAL HIGH (ref 70–99)
Glucose-Capillary: 127 mg/dL — ABNORMAL HIGH (ref 70–99)

## 2019-09-04 NOTE — Progress Notes (Signed)
Occupational Therapy Treatment Patient Details Name: Tyler [name] MRN: [id number] DOB: [birth year] Today's Date: 09/04/2019    History of present illness 48 y/o male transferred from [place] hospital for unresponsiveness, likely secondary to substance abuse. Pt with rhabdomyolysis and AKI. EEG revealed moderate diffuse encephalopathy. PMH includes polysubstance abuse and HTN.    OT comments  Pt continues to be limited by cognitive deficits affecting progression towards OT goals. Pt unable to initiate standing despite MAX multimodal cues to stand. MAX A to brush teeth in recliner. Pt able to correctly identify toothbrush from items on table. Increased time and cues to locate toothpaste. Pt initially brushing teeth with no toothpaste- MAX A to apply paste to brush. Pt attempting to suck on brush versus brushing with it; tactile cues to initiate brushing. DC plan remains appropriate. Will continue to follow for acute OT needs.   Follow Up Recommendations  SNF;Supervision/Assistance - 24 hour    Equipment Recommendations  Other (comment)(tbd to next venue)    Recommendations for Other Services      Precautions / Restrictions Precautions Precautions: Fall Precaution Comments: poor safety awareness Restrictions Weight Bearing Restrictions: No       Mobility Bed Mobility               General bed mobility comments: Pt up in chair  Transfers Overall transfer level: Needs assistance Equipment used: Rolling walker (2 wheeled)             General transfer comment: unable to initiate standing    Balance Overall balance assessment: Needs assistance Sitting-balance support: No upper extremity supported;Feet supported Sitting balance-Leahy Scale: Fair Sitting balance - Comments: able to brush teeth in sitting                                   ADL either performed or assessed with clinical judgement   ADL Overall ADL's : Needs assistance/impaired      Grooming: Oral care;Sitting;Cueing for sequencing;Maximal assistance Grooming Details (indicate cue type and reason): multimodal cues needed for initiation, sequencing, attention and successful completion; initally pt attempting to brush teeth with no tooth paste; tactile cues to initiate actually brushing versus sucking on tooth brush                   Toilet Transfer Details (indicate cue type and reason): unable to transfer despiste MAX multimodal cues to initiate standing from recliner           General ADL Comments: Pt. required extensive assist with ADL secondary to cognition.     Vision   Vision Assessment?: Vision impaired- to be further tested in functional context Additional Comments: continues to squint eyes closed often; will continue to assess; difficult with decreased cognition   Perception     Praxis      Cognition Arousal/Alertness: Awake/alert Behavior During Therapy: Flat affect Overall Cognitive Status: No family/caregiver present to determine baseline cognitive functioning Area of Impairment: Attention;Memory;Following commands;Problem solving;Awareness;Safety/judgement;Orientation                 Orientation Level: Disoriented to;Place;Time;Situation(able to state name at end of session) Current Attention Level: Selective Memory: Decreased recall of precautions;Decreased short-term memory Following Commands: Follows one step commands inconsistently;Follows one step commands with increased time;Follows multi-step commands inconsistently Safety/Judgement: Decreased awareness of safety;Decreased awareness of deficits Awareness: Intellectual Problem Solving: Slow processing;Requires verbal cues General Comments: slow to process; unable to state name at  start of session, just repeating therapist name when asked; able to recall name by end of session; unable able to correctly identify location; MAX cues to follow commands; follows all commands  inconsistently        Exercises     Shoulder Instructions       General Comments      Pertinent Vitals/ Pain       Pain Assessment: No/denies pain  Home Living                                          Prior Functioning/Environment              Frequency  Min 2X/week        Progress Toward Goals  OT Goals(current goals can now be found in the care plan section)  Progress towards OT goals: Progressing toward goals  Acute Rehab OT Goals Patient Stated Goal: none stated OT Goal Formulation: With patient Time For Goal Achievement: 09/12/19 Potential to Achieve Goals: [place] Discharge plan remains appropriate;Frequency remains appropriate    Co-evaluation                 AM-PAC OT "6 Clicks" Daily Activity     Outcome Measure   Help from another person eating meals?: A Little Help from another person taking care of personal grooming?: A Lot Help from another person toileting, which includes using toliet, bedpan, or urinal?: A Lot Help from another person bathing (including washing, rinsing, drying)?: A Lot Help from another person to put on and taking off regular upper body clothing?: A Lot Help from another person to put on and taking off regular lower body clothing?: A Lot 6 Click Score: 13    End of Session Equipment Utilized During Treatment: Rolling walker;Gait belt  OT Visit Diagnosis: Other abnormalities of gait and mobility (R26.89);Other symptoms and signs involving cognitive function;Other symptoms and signs involving the nervous system (R29.898);Muscle weakness (generalized) (M62.81)   Activity Tolerance Patient tolerated treatment well   Patient Left in chair;with call bell/phone within reach   Nurse Communication          Time: 6378-5885 OT Time Calculation (min): 11 min  Charges: OT General Charges $OT Visit: 1 Visit OT Treatments $Self Care/Home Management : 8-22 mins  [place], [place] [phone] [place] 09/04/2019, 5:47 PM

## 2019-09-04 NOTE — Progress Notes (Signed)
Nutrition Follow-up  DOCUMENTATION CODES:   Severe malnutrition in context of acute illness/injury  INTERVENTION:   -Continue MVI with minerals daily -ContinueMagic cup TID with meals, each supplement provides 290 kcal and 9 grams of protein -Continue Ensure Enlive po BID, each supplement provides 350 kcal and 20 grams of protein  NUTRITION DIAGNOSIS:   Severe Malnutrition related to acute illness(acute toxic/metabolic encephalopathy) as evidenced by moderate fat depletion, severe fat depletion, moderate muscle depletion, severe muscle depletion, energy intake < or equal to 50% for > or equal to 5 days, percent weight loss.  Ongoing  GOAL:   Patient will meet greater than or equal to 90% of their needs  Progressing   MONITOR:   PO intake, Supplement acceptance, Labs, Weight trends, Skin, I & O's  REASON FOR ASSESSMENT:   LOS    ASSESSMENT:   Tyler [name] is a 48 y.o. male with medical history significant of polysubstance abuse including heroin.  Brought in to [facility] ED just before MN after being found down by friend last evening with nearby syringe.  Reviewed I/O's: +90 ml x 24 hours and +3.9 L since 08/21/19  UOP: 1.4 L x 24 hours  Pt sitting up in bed at time of visit, pleasant and in good spirits. Pt much more alert and conversant than the past few RD visits. He reports continued good appetite and enjoys consuming Ensure supplements. Meal completion 100%.   Wt has been stable over the past week.   Per CSW notes, pt remains difficult to place. Awaiting SNF placement when available.   Labs reviewed: CBGS: 69-118 (inpatient orders for glycemic control are 0-15 units insulin aspart TID with meals and 0-5 units insulin aspart q HS).  Diet Order:   Diet Order            DIET DYS 3 Room service appropriate? No; Fluid consistency: Thin  Diet effective now              EDUCATION NEEDS:   Not appropriate for education at this time  Skin:  Skin Assessment:  Reviewed RN Assessment  Last BM:  09/04/19  Height:   Ht Readings from Last 1 Encounters:  08/09/19 5\' 8"  (1.727 m)    Weight:   Wt Readings from Last 1 Encounters:  09/04/19 62.2 kg    Ideal Body Weight:  70 kg  BMI:  Body mass index is 20.85 kg/m.  Estimated Nutritional Needs:   Kcal:  2000-2200  Protein:  105-120 grams  Fluid:  > 2 L    Yarielis Funaro A. Jimmye Norman, RD, LDN, [place] Registered Dietitian II Certified Diabetes Care and Education Specialist Pager: [phone] After hours Pager: [phone]

## 2019-09-04 NOTE — Progress Notes (Signed)
PROGRESS NOTE    Tyler [name]  PJK:[id number] DOB: 03/06/1971 DOA: 07/23/2019 PCP: Patient, No Pcp Per   Brief Narrative:  Patient is a 48 year old male with history of polysubstance abuse including heroin presented to [facility] ER after being found down with a syringe nearby. He was given Narcan with minimal improvement. Found in severe rhabdomyolysis with acute kidney injury and hyperkalemia. He was started on IV fluids including bicarb drip and transferred to [facility] for further care. Rhabdomyolysis improved but he had persistence of encephalopathy and was found to have acute bilateral cerebellar infarct. Neurology was consulted and has signed off. PT recommends SNF placement.Prolonged hospitalization due to placement issues. Patient is medically stable for discharge to skilled nursing facility as soon as the bed is available.   Assessment & Plan:   Principal Problem:   Rhabdomyolysis Active Problems:   Acute kidney failure (HCC)   Acute metabolic encephalopathy   Polysubstance abuse (HCC)   Hyperkalemia   Transaminitis   HTN (hypertension)   Cerebral embolism with cerebral infarction   Protein-calorie malnutrition, severe   Acute metabolic encephalopathy: Multifactorial -Most likely secondary CVA, drug use, uremia, heavy alcohol use He is alert and awake but not oriented -CT of the head and neck was initially negative for any traumatic finding -UDS, Tylenol and salicylate levels were negative. -TSH, folate, B12 levels all within normal limits -EEG: Generalized diffuse slowing but no seizure activity -Had required intermittent restraints initially during the hospitalization. -Fall precautions -Awaiting SNF placement.Difficult placement   Acute bilateral CVA, nonhemorrhagic -MRI of the brain showed acute CVA -Neurology evaluated the patient and has signed off. MRA of the head negative. Ultrasound carotid showed bilateral 1 to 39% ICA stenosis -Neurology  recommends considering 30-day cardiac event monitoring as outpatient to rule out A. fib if patient neurologically improves. -LDL 83; A1c 5.1. -Continue aspirin, LFTs slowly improving.  Will start statin once LFTs normalized.Will check LFT in next few days. -Echocardiogram: EF of 60 to 65% -Lower extremity Dopplers: Negative for DVT - PT recommends SNF.  Severe rhabdomyolysis -AKI has resolved -Renal ultrasound negative -Monitor intermittently. Off IV fluids  Transaminitis with elevated bilirubin - Slowly improving -Right upper quadrant ultrasound showed hepatic steatosis with early cirrhosis with some gallbladder thickening -Monitor intermittently.  Alcohol abuse -Advised to quit -Continue thiamine, multivitamin and folic acid.  Urinary tract infection -Present on admission -Completed 5 days of Rocephin  Essential hypertension -Continue metoprolol -Continue to monitor blood pressure and adjust medications as needed.  Severe malnutrition -Poor oral intake -Follow nutrition recommendations   Nutrition Problem: Severe Malnutrition Etiology: acute illness(acute toxic/metabolic encephalopathy)      DVT prophylaxis: Lovenox Code Status: Full Family Communication: None present at the bed side Disposition Plan: SNF as soon as bed is available.Difficult placement   Consultants: Neurology  Procedures:None  Antimicrobials:  Anti-infectives (From admission, onward)   Start     Dose/Rate Route Frequency Ordered Stop   07/25/19 0745  cefTRIAXone (ROCEPHIN) 1 g in sodium chloride 0.9 % 100 mL IVPB     1 g 200 mL/hr over 30 Minutes Intravenous Every 24 hours 07/25/19 0734 07/29/19 1518      Subjective:  Patient seen and examined the bedside this morning.  No new complaints.  Remains comfortable, hemodynamically stable.  No active issues.  Objective: Vitals:   09/03/19 2106 09/04/19 0500 09/04/19 0604 09/04/19 0844  BP: 110/80  95/69   Pulse: 70  (!) 57 66   Resp: 18  15   Temp: 98.4  F (36.9 C)  97.7 F (36.5 C)   TempSrc:   Oral   SpO2: 100%  100%   Weight:  62.2 kg    Height:        Intake/Output Summary (Last 24 hours) at 09/04/2019 1235 Last data filed at 09/04/2019 0900 Gross per 24 hour  Intake 1673 ml  Output 600 ml  Net 1073 ml   Filed Weights   09/01/19 0500 09/02/19 0512 09/04/19 0500  Weight: 63 kg 61.1 kg 62.2 kg    Examination: General exam: Appears calm and comfortable ,Not in distress,sitting on bed    Data Reviewed: I have personally reviewed following labs and imaging studies  CBC: Recent Labs  Lab 09/01/19 0241  WBC 7.0  NEUTROABS 3.4  HGB 12.8*  HCT 39.0  MCV 97.0  PLT 216   Basic Metabolic Panel: Recent Labs  Lab 09/01/19 0241  NA 140  K 4.6  CL 108  CO2 26  GLUCOSE 90  BUN 31*  CREATININE 1.33*  CALCIUM 9.1   GFR: Estimated Creatinine Clearance: 58.5 mL/min (A) (by C-G formula based on SCr of 1.33 mg/dL (H)). Liver Function Tests: No results for input(s): AST, ALT, ALKPHOS, BILITOT, PROT, ALBUMIN in the last 168 hours. No results for input(s): LIPASE, AMYLASE in the last 168 hours. No results for input(s): AMMONIA in the last 168 hours. Coagulation Profile: No results for input(s): INR, PROTIME in the last 168 hours. Cardiac Enzymes: No results for input(s): CKTOTAL, CKMB, CKMBINDEX, TROPONINI in the last 168 hours. BNP (last 3 results) No results for input(s): PROBNP in the last 8760 hours. HbA1C: No results for input(s): HGBA1C in the last 72 hours. CBG: Recent Labs  Lab 09/03/19 0748 09/03/19 1225 09/03/19 1635 09/03/19 2108 09/04/19 0749  GLUCAP 77 84 94 118* 69*   Lipid Profile: No results for input(s): CHOL, HDL, LDLCALC, TRIG, CHOLHDL, LDLDIRECT in the last 72 hours. Thyroid Function Tests: No results for input(s): TSH, T4TOTAL, FREET4, T3FREE, THYROIDAB in the last 72 hours. Anemia Panel: No results for input(s): VITAMINB12, FOLATE, FERRITIN, TIBC, IRON,  RETICCTPCT in the last 72 hours. Sepsis Labs: No results for input(s): PROCALCITON, LATICACIDVEN in the last 168 hours.  Recent Results (from the past 240 hour(s))  MRSA PCR Screening     Status: None   Collection Time: 08/25/19 12:55 PM   Specimen: Nasopharyngeal  Result Value Ref Range Status   MRSA by PCR NEGATIVE NEGATIVE Final    Comment:        The GeneXpert MRSA Assay (FDA approved for NASAL specimens only), is one component of a comprehensive MRSA colonization surveillance program. It is not intended to diagnose MRSA infection nor to guide or monitor treatment for MRSA infections. Performed at [facility], [address] [place], [place] [address]          Radiology Studies: No results found.      Scheduled Meds: .  stroke: mapping our early stages of recovery book   Does not apply Once  . aspirin  300 mg Rectal Daily   Or  . aspirin  325 mg Oral Daily  . enoxaparin (LOVENOX) injection  40 mg Subcutaneous Q24H  . feeding supplement (ENSURE ENLIVE)  237 mL Oral BID BM  . folic acid  1 mg Oral Daily  . hydrALAZINE  25 mg Oral Once  . insulin aspart  0-15 Units Subcutaneous TID WC  . insulin aspart  0-5 Units Subcutaneous QHS  . lactulose  20  g Oral BID   Or  . lactulose  300 mL Rectal BID  . metoprolol tartrate  25 mg Oral BID  . multivitamin with minerals  1 tablet Oral Daily  . thiamine  100 mg Oral Daily   Or  . thiamine  100 mg Intravenous Daily   Continuous Infusions:   LOS: 43 days    Time spent: 25 mins.More than 50% of that time was spent in counseling and/or coordination of care.      Burnadette PopAmrit Ople Girgis, MD [facility] Pager [phone]  If 7PM-7AM, please contact night-coverage [email or web address] Password [email or web address] 09/04/2019, 12:35 PM

## 2019-09-04 NOTE — Plan of Care (Signed)
  Problem: Clinical Measurements: Goal: Ability to maintain clinical measurements within normal limits will improve Outcome: Adequate for Discharge   Problem: Activity: Goal: Risk for activity intolerance will decrease Outcome: Adequate for Discharge   Problem: Nutrition: Goal: Adequate nutrition will be maintained Outcome: Adequate for Discharge   Problem: Health Behavior/Discharge Planning: Goal: Ability to manage health-related needs will improve Outcome: Adequate for Discharge   Problem: Nutrition: Goal: Risk of aspiration will decrease Outcome: Adequate for Discharge Goal: Dietary intake will improve Outcome: Adequate for Discharge   Problem: Ischemic Stroke/TIA Tissue Perfusion: Goal: Complications of ischemic stroke/TIA will be minimized Outcome: Adequate for Discharge   Problem: Clinical Measurements: Goal: Ability to maintain clinical measurements within normal limits will improve Outcome: Adequate for Discharge Goal: Will remain free from infection Outcome: Adequate for Discharge Goal: Diagnostic test results will improve Outcome: Adequate for Discharge   Problem: Activity: Goal: Risk for activity intolerance will decrease Outcome: Adequate for Discharge   Problem: Nutrition: Goal: Adequate nutrition will be maintained Outcome: Adequate for Discharge

## 2019-09-05 LAB — GLUCOSE, CAPILLARY
Glucose-Capillary: 78 mg/dL (ref 70–99)
Glucose-Capillary: 107 mg/dL — ABNORMAL HIGH (ref 70–99)
Glucose-Capillary: 126 mg/dL — ABNORMAL HIGH (ref 70–99)
Glucose-Capillary: 107 mg/dL — ABNORMAL HIGH (ref 70–99)

## 2019-09-05 LAB — BASIC METABOLIC PANEL
Anion gap: 9 (ref 5–15)
BUN: 29 mg/dL — ABNORMAL HIGH (ref 6–20)
CO2: 23 mmol/L (ref 22–32)
Calcium: 8.7 mg/dL — ABNORMAL LOW (ref 8.9–10.3)
Chloride: 104 mmol/L (ref 98–111)
Creatinine, Ser: 1.27 mg/dL — ABNORMAL HIGH (ref 0.61–1.24)
GFR calc Af Amer: >60 mL/min (ref >60)
GFR calc non Af Amer: >60 mL/min (ref >60)
Glucose, Bld: 87 mg/dL (ref 70–99)
Potassium: 3.9 mmol/L (ref 3.5–5.1)
Sodium: 136 mmol/L (ref 135–145)

## 2019-09-05 LAB — HEPATIC FUNCTION PANEL
ALT: 109 U/L — ABNORMAL HIGH (ref 0–44)
AST: 89 U/L — ABNORMAL HIGH (ref 15–41)
Albumin: 3.3 g/dL — ABNORMAL LOW (ref 3.5–5.0)
Alkaline Phosphatase: 65 U/L (ref 38–126)
Bilirubin, Direct: <0.1 mg/dL (ref 0.0–0.2)
Total Bilirubin: 0.6 mg/dL (ref 0.3–1.2)
Total Protein: 6.8 g/dL (ref 6.5–8.1)

## 2019-09-05 NOTE — Progress Notes (Signed)
Physical Therapy Treatment Patient Details Name: Tyler [name] MRN: [id number] DOB: Mar 04, 1971 Today's Date: 09/05/2019    History of Present Illness 48 y/o male transferred from [place] for unresponsiveness, likely secondary to substance abuse. Pt with rhabdomyolysis and AKI. EEG revealed moderate diffuse encephalopathy. PMH includes polysubstance abuse and HTN.     PT Comments    Patient seen for mobility progression. Continue to progress as tolerated with anticipated d/c to SNF for further skilled PT services.     Follow Up Recommendations  SNF     Equipment Recommendations  None recommended by PT    Recommendations for Other Services       Precautions / Restrictions Precautions Precautions: Fall    Mobility  Bed Mobility Overal bed mobility: Needs Assistance Bed Mobility: Supine to Sit;Sit to Supine     Supine to sit: Supervision Sit to supine: Supervision   General bed mobility comments: for safety  Transfers Overall transfer level: Needs assistance Equipment used: None Transfers: Sit to/from Stand Sit to Stand: Min guard         General transfer comment: min guard for safety; impulsive to stand and left sheet wrapped around his feet without awareness; pt asked to return to sitting until it was safe   Ambulation/Gait Ambulation/Gait assistance: Min assist;Mod assist Gait Distance (Feet): 300 Feet Assistive device: (assist at trunk with gait belt) Gait Pattern/deviations: Step-through pattern;Decreased stride length;Drifts right/left     General Gait Details: assistance required for balance; pt with significant gait deviations when given challenges; pt unable to identify anything in the hallway; pt runs into objects in hallway especially on L side   Stairs             Wheelchair Mobility    Modified Rankin (Stroke Patients Only) Modified Rankin (Stroke Patients Only) Pre-Morbid Rankin Score: No symptoms Modified Rankin: Moderately  severe disability     Balance Overall balance assessment: Needs assistance Sitting-balance support: No upper extremity supported;Feet supported Sitting balance-Leahy Scale: Fair     Standing balance support: Single extremity supported;No upper extremity supported;During functional activity Standing balance-Leahy Scale: Fair                              Cognition Arousal/Alertness: Awake/alert Behavior During Therapy: WFL for tasks assessed/performed Overall Cognitive Status: Impaired/Different from baseline Area of Impairment: Attention;Memory;Following commands;Problem solving;Awareness;Safety/judgement;Orientation                 Orientation Level: Disoriented to;Place;Time;Situation(able to state name but no birthdate; reports he's "like 18") Current Attention Level: Sustained Memory: Decreased short-term memory Following Commands: Follows one step commands with increased time Safety/Judgement: Decreased awareness of safety;Decreased awareness of deficits Awareness: Intellectual Problem Solving: Slow processing;Requires verbal cues;Difficulty sequencing;Requires tactile cues General Comments: pt answering "I don't know" and laughing in response to nearly every question he was asked and unable to recall that he is in the hospital although it was discussed beginning of session; pt requires multimodal cues for completion of mobility tasks      Exercises      General Comments        Pertinent Vitals/Pain Pain Assessment: No/denies pain    Home Living                      Prior Function            PT Goals (current goals can now be found in the care plan section)  Progress towards PT goals: Progressing toward goals    Frequency    Min 2X/week      PT Plan Current plan remains appropriate    Co-evaluation              AM-PAC PT "6 Clicks" Mobility   Outcome Measure  Help needed turning from your back to your side while in a  flat bed without using bedrails?: None Help needed moving from lying on your back to sitting on the side of a flat bed without using bedrails?: None Help needed moving to and from a bed to a chair (including a wheelchair)?: A Little Help needed standing up from a chair using your arms (e.g., wheelchair or bedside chair)?: A Little Help needed to walk in hospital room?: A Little Help needed climbing 3-5 steps with a railing? : A Little 6 Click Score: 20    End of Session Equipment Utilized During Treatment: Gait belt Activity Tolerance: Patient tolerated treatment well Patient left: with call bell/phone within reach;with bed alarm set;in bed(refused OOB to chair) Nurse Communication: Mobility status PT Visit Diagnosis: Muscle weakness (generalized) (M62.81);Difficulty in walking, not elsewhere classified (R26.2);Other abnormalities of gait and mobility (R26.89)     Time: 1540-1600 PT Time Calculation (min) (ACUTE ONLY): 20 min  Charges:  $Gait Training: 8-22 mins                     Erline Levine, PTA Acute Rehabilitation Services Pager: [phone] Office: [phone]     Carolynne Edouard 09/05/2019, 4:57 PM

## 2019-09-05 NOTE — Progress Notes (Addendum)
PROGRESS NOTE    Tyler [name]  HUD:[id number] DOB: Oct 06, 1971 DOA: 07/23/2019 PCP: Patient, No Pcp Per   Brief Narrative:  Patient is a 48 year old male with history of polysubstance abuse including heroin presented to [facility] ER after being found down with a syringe nearby. He was given Narcan with minimal improvement. On presentation,he was found to have severe rhabdomyolysis with acute kidney injury and hyperkalemia. He was started on IV fluids including bicarb drip and transferred to [facility] for further care. Rhabdomyolysis improved but he had persistence of encephalopathy and was found to have acute bilateral cerebellar infarct. Neurology was consulted and has signed off. PT recommends SNF placement.Prolonged hospitalization due to placement issues. Patient is medically stable for discharge to skilled nursing facility as soon as the bed is available.   Assessment & Plan:   Principal Problem:   Rhabdomyolysis Active Problems:   Acute kidney failure (HCC)   Acute metabolic encephalopathy   Polysubstance abuse (HCC)   Hyperkalemia   Transaminitis   HTN (hypertension)   Cerebral embolism with cerebral infarction   Protein-calorie malnutrition, severe   Acute metabolic encephalopathy: Multifactorial -Most likely secondary CVA, drug use, uremia, heavy alcohol use He is alert and awake but not oriented -CT of the head and neck was initially negative for any traumatic finding -UDS, Tylenol and salicylate levels were negative. -TSH, folate, B12 levels all within normal limits -EEG: Generalized diffuse slowing but no seizure activity -Had required intermittent restraints initially during the hospitalization. -Fall precautions -Now alert, awake, calm, cooperative but not oriented -Awaiting SNF placement.Difficult placement   Acute bilateral CVA, nonhemorrhagic -MRI of the brain showed acute CVA -Neurology evaluated the patient and has signed off. MRA of the head  negative. Ultrasound carotid showed bilateral 1 to 39% ICA stenosis -Neurology recommends considering 30-day cardiac event monitoring as outpatient to rule out A. fib if patient neurologically improves. -LDL 83; A1c 5.1. -Continue aspirin, not started on statin due to elevated LFTs. -Echocardiogram: EF of 60 to 65% -Lower extremity Dopplers: Negative for DVT - PT recommends SNF.  Severe rhabdomyolysis -AKI has resolved -Renal ultrasound negative -Off IV fluids  Transaminitis with elevated bilirubin -Right upper quadrant ultrasound showed hepatic steatosis with early cirrhosis with some gallbladder thickening -Most likely associated with chronic alcohol abuse  Alcohol abuse -Advised to quit -Continue thiamine, multivitamin and folic acid.  Urinary tract infection -Present on admission -Completed 5 days of Rocephin  Essential hypertension -On metoprolol -Continue to monitor blood pressure and adjust medications as needed.  Severe malnutrition -Poor oral intake -Follow nutrition recommendations   Nutrition Problem: Severe Malnutrition Etiology: acute illness(acute toxic/metabolic encephalopathy)      DVT prophylaxis: Lovenox Code Status: Full Family Communication: Called daughter on phone today.She said she is applying for medicaid. Disposition Plan: SNF as soon as bed is available.Difficult placement   Consultants: Neurology  Procedures:None  Antimicrobials:  Anti-infectives (From admission, onward)   Start     Dose/Rate Route Frequency Ordered Stop   07/25/19 0745  cefTRIAXone (ROCEPHIN) 1 g in sodium chloride 0.9 % 100 mL IVPB     1 g 200 mL/hr over 30 Minutes Intravenous Every 24 hours 07/25/19 0734 07/29/19 1518      Subjective:  Patient seen and examined the bedside this morning.  As always, lying on the bed, alert, awake, calm, comfortable.  Communicates well but not oriented at all.  Objective: Vitals:   09/04/19 1251 09/04/19 2145  09/04/19 2318 09/05/19 0530  BP: (!) 94/55 101/76 108/65 108/80  Pulse: 65 76 78 (!) 57  Resp: 16 16  16   Temp: 98.5 F (36.9 C) (!) 97.5 F (36.4 C)  98.4 F (36.9 C)  TempSrc:  Oral  Oral  SpO2: 99% 97%  100%  Weight:    63.4 kg  Height:        Intake/Output Summary (Last 24 hours) at 09/05/2019 1104 Last data filed at 09/05/2019 0900 Gross per 24 hour  Intake 957 ml  Output 400 ml  Net 557 ml   Filed Weights   09/02/19 0512 09/04/19 0500 09/05/19 0530  Weight: 61.1 kg 62.2 kg 63.4 kg    Examination:  General exam: Appears calm and comfortable ,Not in distress,average built Respiratory system: Bilateral equal air entry, normal vesicular breath sounds, no wheezes or crackles  Cardiovascular system: S1 & S2 heard, RRR. No JVD, murmurs, rubs, gallops or clicks. Gastrointestinal system: Abdomen is nondistended, soft and nontender. No organomegaly or masses felt. Normal bowel sounds heard. Central nervous system: Alert and awake but not oriented Extremities: No edema, no clubbing ,no cyanosis, distal peripheral pulses palpable. Skin: No rashes, lesions or ulcers,no icterus ,no pallor Psychiatry: Judgement and insight appear impaired   Data Reviewed: I have personally reviewed following labs and imaging studies  CBC: Recent Labs  Lab 09/01/19 0241  WBC 7.0  NEUTROABS 3.4  HGB 12.8*  HCT 39.0  MCV 97.0  PLT 216   Basic Metabolic Panel: Recent Labs  Lab 09/01/19 0241 09/05/19 0331  NA 140 136  K 4.6 3.9  CL 108 104  CO2 26 23  GLUCOSE 90 87  BUN 31* 29*  CREATININE 1.33* 1.27*  CALCIUM 9.1 8.7*   GFR: Estimated Creatinine Clearance: 62.4 mL/min (A) (by C-G formula based on SCr of 1.27 mg/dL (H)). Liver Function Tests: No results for input(s): AST, ALT, ALKPHOS, BILITOT, PROT, ALBUMIN in the last 168 hours. No results for input(s): LIPASE, AMYLASE in the last 168 hours. No results for input(s): AMMONIA in the last 168 hours. Coagulation Profile: No  results for input(s): INR, PROTIME in the last 168 hours. Cardiac Enzymes: No results for input(s): CKTOTAL, CKMB, CKMBINDEX, TROPONINI in the last 168 hours. BNP (last 3 results) No results for input(s): PROBNP in the last 8760 hours. HbA1C: No results for input(s): HGBA1C in the last 72 hours. CBG: Recent Labs  Lab 09/04/19 0749 09/04/19 1234 09/04/19 1613 09/04/19 2148 09/05/19 0756  GLUCAP 69* 94 125* 127* 78   Lipid Profile: No results for input(s): CHOL, HDL, LDLCALC, TRIG, CHOLHDL, LDLDIRECT in the last 72 hours. Thyroid Function Tests: No results for input(s): TSH, T4TOTAL, FREET4, T3FREE, THYROIDAB in the last 72 hours. Anemia Panel: No results for input(s): VITAMINB12, FOLATE, FERRITIN, TIBC, IRON, RETICCTPCT in the last 72 hours. Sepsis Labs: No results for input(s): PROCALCITON, LATICACIDVEN in the last 168 hours.  No results found for this or any previous visit (from the past 240 hour(s)).       Radiology Studies: No results found.      Scheduled Meds: .  stroke: mapping our early stages of recovery book   Does not apply Once  . aspirin  300 mg Rectal Daily   Or  . aspirin  325 mg Oral Daily  . enoxaparin (LOVENOX) injection  40 mg Subcutaneous Q24H  . feeding supplement (ENSURE ENLIVE)  237 mL Oral BID BM  . folic acid  1 mg Oral Daily  . hydrALAZINE  25 mg Oral Once  . insulin aspart  0-15 Units  Subcutaneous TID WC  . insulin aspart  0-5 Units Subcutaneous QHS  . lactulose  20 g Oral BID   Or  . lactulose  300 mL Rectal BID  . metoprolol tartrate  25 mg Oral BID  . multivitamin with minerals  1 tablet Oral Daily  . thiamine  100 mg Oral Daily   Or  . thiamine  100 mg Intravenous Daily   Continuous Infusions:   LOS: 44 days    Time spent: 25 mins.More than 50% of that time was spent in counseling and/or coordination of care.      Shelly Coss, MD [facility] Pager [phone]  If 7PM-7AM, please contact night-coverage  [email or web address] Password [email or web address] 09/05/2019, 11:04 AM

## 2019-09-06 DIAGNOSIS — G9341 Metabolic encephalopathy: Secondary | ICD-10-CM | POA: Diagnosis not present

## 2019-09-06 LAB — GLUCOSE, CAPILLARY
Glucose-Capillary: 132 mg/dL — ABNORMAL HIGH (ref 70–99)
Glucose-Capillary: 118 mg/dL — ABNORMAL HIGH (ref 70–99)
Glucose-Capillary: 130 mg/dL — ABNORMAL HIGH (ref 70–99)
Glucose-Capillary: 93 mg/dL (ref 70–99)

## 2019-09-06 NOTE — Progress Notes (Signed)
PROGRESS NOTE    Tyler [name]  ZOX:[id number] DOB: 02/27/1971 DOA: 07/23/2019 PCP: Patient, No Pcp Per   Brief Narrative:  Patient is a 48 year old male with history of polysubstance abuse including heroin presented to [facility] ER after being found down with a syringe nearby. He was given Narcan with minimal improvement. On presentation,he was found to have severe rhabdomyolysis with acute kidney injury and hyperkalemia. He was started on IV fluids including bicarb drip and transferred to [facility] for [facility] care. Rhabdomyolysis improved but he had persistence of encephalopathy and was found to have acute bilateral cerebellar infarct. Neurology was consulted and has signed off. PT recommends SNF placement.Prolonged hospitalization due to placement issues. Patient is medically stable for discharge to skilled nursing facility as soon as the bed is available.   Assessment & Plan:   Principal Problem:   Rhabdomyolysis Active Problems:   Acute kidney failure (HCC)   Acute metabolic encephalopathy   Polysubstance abuse (HCC)   Hyperkalemia   Transaminitis   HTN (hypertension)   Cerebral embolism with cerebral infarction   Protein-calorie malnutrition, severe   Acute encephalopathy: Unknown baseline but appears near new normal.  Does not follow commands.  Multifactorial: Most likely secondary CVA, drug use, uremia, heavy alcohol use He is alert and awake but not oriented -CT of the head and neck was initially negative for any traumatic finding -UDS, Tylenol and salicylate levels were negative. -TSH, folate, B12 levels all within normal limits -EEG: Generalized diffuse slowing but no seizure activity -Had required intermittent restraints initially during the hospitalization. -Fall precautions -Now alert, awake, calm, cooperative but not oriented -Awaiting SNF placement.Difficult placement   Acute bilateral CVA, nonhemorrhagic -MRI of the brain showed acute CVA -Neurology  evaluated the patient and has signed off. MRA of the head negative. Ultrasound carotid showed bilateral 1 to 39% ICA stenosis -Neurology recommends considering 30-day cardiac event monitoring as outpatient to rule out A. fib if patient neurologically improves. -LDL 83; A1c 5.1. -Continue aspirin, not started on statin due to elevated LFTs. -Echocardiogram: EF of 60 to 65% -Lower extremity Dopplers: Negative for DVT - PT recommends SNF.  Severe rhabdomyolysis -AKI has resolved -Renal ultrasound negative -Off IV fluids  Transaminitis with elevated bilirubin -Right upper quadrant ultrasound showed hepatic steatosis with early cirrhosis with some gallbladder thickening -Most likely associated with chronic alcohol abuse with fatty liver -Continue to monitor  Alcohol abuse -Advised to quit -Continue thiamine, multivitamin and folic acid.  Urinary tract infection -Present on admission -Completed 5 days of Rocephin  Essential hypertension -On metoprolol -Continue to monitor blood pressure and adjust medications as needed.  Severe malnutrition -Poor oral intake -Follow nutrition recommendations   Nutrition Problem: Severe Malnutrition Etiology: acute illness(acute toxic/metabolic encephalopathy)      DVT prophylaxis: Lovenox Code Status: Full Family Communication: Called daughter on phone today with no answer.She said she is applying for medicaid. Disposition Plan: SNF as soon as bed is available.Difficult placement   Consultants: Neurology  Procedures:None  Antimicrobials:  Anti-infectives (From admission, onward)   Start     Dose/Rate Route Frequency Ordered Stop   07/25/19 0745  cefTRIAXone (ROCEPHIN) 1 g in sodium chloride 0.9 % 100 mL IVPB     1 g 200 mL/hr over 30 Minutes Intravenous Every 24 hours 07/25/19 0734 07/29/19 1518      Subjective:  Patient seen and examined at bedside this morning in no acute distress resting comfortably.  He is  pleasantly confused.  Unable to provide a history due  to underlying medical condition.  Objective: Vitals:   09/05/19 2125 09/06/19 0616 09/06/19 0652 09/06/19 1240  BP: 100/73 (!) 148/93  128/84  Pulse: 66 60  77  Resp: 16 16  18   Temp: (!) 97.5 F (36.4 C) 98.1 F (36.7 C)  98.4 F (36.9 C)  TempSrc: Oral Oral  Oral  SpO2: 98% 100%  100%  Weight:   62.9 kg   Height:        Intake/Output Summary (Last 24 hours) at 09/06/2019 1718 Last data filed at 09/06/2019 0845 Gross per 24 hour  Intake 310 ml  Output -  Net 310 ml   Filed Weights   09/04/19 0500 09/05/19 0530 09/06/19 0652  Weight: 62.2 kg 63.4 kg 62.9 kg    Examination:  General exam: Appears calm and comfortable ,Not in distress,average built, pleasantly confused Respiratory system: Bilateral equal air entry, normal vesicular breath sounds, no wheezes or crackles  Cardiovascular system: S1 & S2 heard, RRR. No JVD, murmurs, rubs, gallops or clicks. Gastrointestinal system: Abdomen is nondistended, soft and nontender. No organomegaly or masses felt. Normal bowel sounds heard. Central nervous system: Alert and awake but not oriented x2.  Decreased muscle strength in left upper extremity.  Does not follow commands well. Extremities: No edema, no clubbing ,no cyanosis, distal peripheral pulses palpable. Skin: No rashes, lesions or ulcers,no icterus ,no pallor Psychiatry: Judgement and insight appear impaired   Data Reviewed: I have personally reviewed following labs and imaging studies  CBC: Recent Labs  Lab 09/01/19 0241  WBC 7.0  NEUTROABS 3.4  HGB 12.8*  HCT 39.0  MCV 97.0  PLT 161   Basic Metabolic Panel: Recent Labs  Lab 09/01/19 0241 09/05/19 0331  NA 140 136  K 4.6 3.9  CL 108 104  CO2 26 23  GLUCOSE 90 87  BUN 31* 29*  CREATININE 1.33* 1.27*  CALCIUM 9.1 8.7*   GFR: Estimated Creatinine Clearance: 61.9 mL/min (A) (by C-G formula based on SCr of 1.27 mg/dL (H)). Liver Function Tests:  Recent Labs  Lab 09/05/19 0331  AST 89*  ALT 109*  ALKPHOS 65  BILITOT 0.6  PROT 6.8  ALBUMIN 3.3*   No results for input(s): LIPASE, AMYLASE in the last 168 hours. No results for input(s): AMMONIA in the last 168 hours. Coagulation Profile: No results for input(s): INR, PROTIME in the last 168 hours. Cardiac Enzymes: No results for input(s): CKTOTAL, CKMB, CKMBINDEX, TROPONINI in the last 168 hours. BNP (last 3 results) No results for input(s): PROBNP in the last 8760 hours. HbA1C: No results for input(s): HGBA1C in the last 72 hours. CBG: Recent Labs  Lab 09/05/19 1653 09/05/19 2127 09/06/19 0803 09/06/19 1149 09/06/19 1701  GLUCAP 126* 107* 132* 118* 130*   Lipid Profile: No results for input(s): CHOL, HDL, LDLCALC, TRIG, CHOLHDL, LDLDIRECT in the last 72 hours. Thyroid Function Tests: No results for input(s): TSH, T4TOTAL, FREET4, T3FREE, THYROIDAB in the last 72 hours. Anemia Panel: No results for input(s): VITAMINB12, FOLATE, FERRITIN, TIBC, IRON, RETICCTPCT in the last 72 hours. Sepsis Labs: No results for input(s): PROCALCITON, LATICACIDVEN in the last 168 hours.  No results found for this or any previous visit (from the past 240 hour(s)).       Radiology Studies: No results found.      Scheduled Meds: .  stroke: mapping our early stages of recovery book   Does not apply Once  . aspirin  325 mg Oral Daily  . enoxaparin (LOVENOX) injection  40 mg Subcutaneous Q24H  . feeding supplement (ENSURE ENLIVE)  237 mL Oral BID BM  . folic acid  1 mg Oral Daily  . insulin aspart  0-15 Units Subcutaneous TID WC  . insulin aspart  0-5 Units Subcutaneous QHS  . lactulose  20 g Oral BID  . metoprolol tartrate  25 mg Oral BID  . multivitamin with minerals  1 tablet Oral Daily  . thiamine  100 mg Oral Daily   Or  . thiamine  100 mg Intravenous Daily   Continuous Infusions:   LOS: 45 days    Time spent: 25 mins.More than 50% of that time was spent in  counseling and/or coordination of care.      Jae Dire, DO [facility] Pager [phone]  If 7PM-7AM, please contact night-coverage [email or web address] Password [email or web address] 09/06/2019, 5:18 PM

## 2019-09-06 NOTE — Plan of Care (Signed)
  Problem: Nutrition: Goal: Risk of aspiration will decrease Outcome: Not Progressing Goal: Dietary intake will improve Outcome: Not Progressing   Problem: Health Behavior/Discharge Planning: Goal: Ability to manage health-related needs will improve Outcome: Not Progressing   Problem: Nutrition: Goal: Adequate nutrition will be maintained Outcome: Not Progressing   Problem: Activity: Goal: Risk for activity intolerance will decrease Outcome: Not Progressing

## 2019-09-06 NOTE — Progress Notes (Signed)
Patient repeatedly removing tele monitor. When asked why, patient states "I don't know". Patient repeatedly encouraged not to remove tele. Will continue to monitor.

## 2019-09-07 DIAGNOSIS — G9341 Metabolic encephalopathy: Secondary | ICD-10-CM | POA: Diagnosis not present

## 2019-09-07 LAB — COMPREHENSIVE METABOLIC PANEL
ALT: 113 U/L — ABNORMAL HIGH (ref 0–44)
AST: 84 U/L — ABNORMAL HIGH (ref 15–41)
Albumin: 3.5 g/dL (ref 3.5–5.0)
Alkaline Phosphatase: 77 U/L (ref 38–126)
Anion gap: 11 (ref 5–15)
BUN: 23 mg/dL — ABNORMAL HIGH (ref 6–20)
CO2: 25 mmol/L (ref 22–32)
Calcium: 9.1 mg/dL (ref 8.9–10.3)
Chloride: 106 mmol/L (ref 98–111)
Creatinine, Ser: 1.23 mg/dL (ref 0.61–1.24)
GFR calc Af Amer: >60 mL/min (ref >60)
GFR calc non Af Amer: >60 mL/min (ref >60)
Glucose, Bld: 125 mg/dL — ABNORMAL HIGH (ref 70–99)
Potassium: 4.3 mmol/L (ref 3.5–5.1)
Sodium: 142 mmol/L (ref 135–145)
Total Bilirubin: 0.9 mg/dL (ref 0.3–1.2)
Total Protein: 7.6 g/dL (ref 6.5–8.1)

## 2019-09-07 LAB — GLUCOSE, CAPILLARY
Glucose-Capillary: 77 mg/dL (ref 70–99)
Glucose-Capillary: 100 mg/dL — ABNORMAL HIGH (ref 70–99)
Glucose-Capillary: 98 mg/dL (ref 70–99)
Glucose-Capillary: 133 mg/dL — ABNORMAL HIGH (ref 70–99)

## 2019-09-07 NOTE — Progress Notes (Signed)
Physical Therapy Treatment Patient Details Name: Diante Barley MRN: [id number] DOB: [birth year] Today's Date: 09/07/2019    History of Present Illness 48 y/o male transferred from [place] for unresponsiveness, likely secondary to substance abuse. Pt with rhabdomyolysis and AKI. EEG revealed moderate diffuse encephalopathy. PMH includes polysubstance abuse and HTN.     PT Comments    Pt was able to walk a greater distance today, note that his awareness of balance off an AD was better.  He is minimally aware of obstacles, does not have consistent protective ext and is laterally drifting on the hall.  Follow up acutely for deficits of balance, working to elicit balance awareness as is possible, and to encourage his participation in all aspects of therapy given recent partial refusals.   Follow Up Recommendations  SNF     Equipment Recommendations  None recommended by PT    Recommendations for Other Services       Precautions / Restrictions Precautions Precautions: Fall Precaution Comments: poor safety awareness Restrictions Weight Bearing Restrictions: No    Mobility  Bed Mobility               General bed mobility comments: up in chair when PT arrived  Transfers Overall transfer level: Needs assistance Equipment used: None Transfers: Sit to/from Stand Sit to Stand: Supervision;Min guard         General transfer comment: supervised due to pt paying no attention to having a catheter  Ambulation/Gait Ambulation/Gait assistance: Min guard Gait Distance (Feet): 600 Feet Assistive device: 1 person hand held assist Gait Pattern/deviations: Step-through pattern;Decreased stride length;Trunk flexed Gait velocity: variable Gait velocity interpretation: <1.31 ft/sec, indicative of household ambulator General Gait Details: pt is not extremely unsteady with no AD, but does need a person to assist him for safety as he is not attending to obstacles or  his need for  any WB support   Stairs             Wheelchair Mobility    Modified Rankin (Stroke Patients Only) Modified Rankin (Stroke Patients Only) Pre-Morbid Rankin Score: No symptoms Modified Rankin: Moderate disability     Balance Overall balance assessment: Needs assistance Sitting-balance support: No upper extremity supported Sitting balance-Leahy Scale: Good     Standing balance support: Single extremity supported Standing balance-Leahy Scale: Fair                              Cognition Arousal/Alertness: Awake/alert Behavior During Therapy: Flat affect;Impulsive Overall Cognitive Status: Impaired/Different from baseline Area of Impairment: Problem solving;Awareness;Safety/judgement;Following commands;Memory                 Orientation Level: Situation Current Attention Level: Selective Memory: Decreased short-term memory Following Commands: Follows one step commands inconsistently;Follows one step commands with increased time Safety/Judgement: Decreased awareness of safety;Decreased awareness of deficits Awareness: Intellectual Problem Solving: Slow processing;Requires verbal cues;Difficulty sequencing;Requires tactile cues General Comments: pt was able to walk but not able to set limits until he had walked x 2 on full hallway and could determine he was tired      Exercises Total Joint Exercises Ankle Circles/Pumps: AROM;AAROM;Both;5 reps General Exercises - Lower Extremity Quad Sets: AAROM;Both;10 reps Gluteal Sets: AROM;AAROM;Both;10 reps    General Comments General comments (skin integrity, edema, etc.): pt is demonstrating no awareness of his catheter or need to manage small balance changes.  Pt is drifting across the hallway to manage path navigation  Pertinent Vitals/Pain Pain Assessment: No/denies pain    Home Living                      Prior Function            PT Goals (current goals can now be found in the care  plan section) Acute Rehab PT Goals Patient Stated Goal: to get up and move Progress towards PT goals: Progressing toward goals    Frequency    Min 2X/week      PT Plan Current plan remains appropriate    Co-evaluation              AM-PAC PT "6 Clicks" Mobility   Outcome Measure  Help needed turning from your back to your side while in a flat bed without using bedrails?: None Help needed moving from lying on your back to sitting on the side of a flat bed without using bedrails?: None Help needed moving to and from a bed to a chair (including a wheelchair)?: None Help needed standing up from a chair using your arms (e.g., wheelchair or bedside chair)?: A Little Help needed to walk in hospital room?: A Little Help needed climbing 3-5 steps with a railing? : A Little 6 Click Score: 21    End of Session Equipment Utilized During Treatment: Gait belt Activity Tolerance: Patient tolerated treatment well Patient left: with call bell/phone within reach;with bed alarm set;in bed Nurse Communication: Mobility status PT Visit Diagnosis: Muscle weakness (generalized) (M62.81);Difficulty in walking, not elsewhere classified (R26.2);Other abnormalities of gait and mobility (R26.89)     Time: 7616-0737 PT Time Calculation (min) (ACUTE ONLY): 41 min  Charges:  $Gait Training: 8-22 mins $Therapeutic Exercise: 8-22 mins $Therapeutic Activity: 8-22 mins                    Ivar Drape 09/07/2019, 4:04 PM   Samul Dada, PT MS Acute Rehab Dept. Number: [facility] [id number] and [facility] [phone]

## 2019-09-07 NOTE — Plan of Care (Signed)
  Problem: Clinical Measurements: Goal: Ability to maintain clinical measurements within normal limits will improve Outcome: Progressing   Problem: Activity: Goal: Risk for activity intolerance will decrease Outcome: Progressing   Problem: Nutrition: Goal: Adequate nutrition will be maintained Outcome: Progressing   Problem: Clinical Measurements: Goal: Ability to maintain clinical measurements within normal limits will improve Outcome: Progressing Goal: Will remain free from infection Outcome: Progressing Goal: Diagnostic test results will improve Outcome: Progressing   Problem: Activity: Goal: Risk for activity intolerance will decrease Outcome: Progressing   Problem: Nutrition: Goal: Adequate nutrition will be maintained Outcome: Progressing   Problem: Health Behavior/Discharge Planning: Goal: Ability to manage health-related needs will improve Outcome: Not Progressing Note: Confusion   Problem: Nutrition: Goal: Risk of aspiration will decrease Outcome: Progressing Goal: Dietary intake will improve Outcome: Progressing   Problem: Ischemic Stroke/TIA Tissue Perfusion: Goal: Complications of ischemic stroke/TIA will be minimized Outcome: Progressing   

## 2019-09-07 NOTE — Progress Notes (Signed)
   09/06/19 2124  MEWS Score  Resp 20  Pulse Rate 72  BP (!) 144/91  Temp 97.8 F (36.6 C)  SpO2 100 %  O2 Device Room Air  MEWS Score  MEWS RR 0  MEWS Pulse 0  MEWS Systolic 0  MEWS LOC 0  MEWS Temp 0  MEWS Score 0  MEWS Score Color Green  MEWS Assessment  Is this an acute change? No  MEWS Guidelines - (patients age 50 and over)  Red - At High Risk for Deterioration Yellow - At risk for Deterioration  1. Go to room and assess patient 2. Validate data. Is this patient's baseline? If data confirmed: 3. Is this an acute change? 4. Administer prn meds/treatments as ordered. 5. Note Sepsis score 6. Review goals of care 7. Sports coach, RRT nurse and Provider. 8. Ask Provider to come to bedside.  9. Document patient condition/interventions/response. 10. Increase frequency of vital signs and focused assessments to at least q15 minutes x 4, then q30 minutes x2. - If stable, then q1h x3, then q4h x3 and then q8h or dept. routine. - If unstable, contact Provider & RRT nurse. Prepare for possible transfer. 11. Add entry in progress notes using the smart phrase ".MEWS". 1. Go to room and assess patient 2. Validate data. Is this patient's baseline? If data confirmed: 3. Is this an acute change? 4. Administer prn meds/treatments as ordered? 5. Note Sepsis score 6. Review goals of care 7. Sports coach and Provider 8. Call RRT nurse as needed. 9. Document patient condition/interventions/response. 10. Increase frequency of vital signs and focused assessments to at least q2h x2. - If stable, then q4h x2 and then q8h or dept. routine. - If unstable, contact Provider & RRT nurse. Prepare for possible transfer. 11. Add entry in progress notes using the smart phrase ".MEWS".  Green - Likely stable Lavender - Comfort Care Only  1. Continue routine/ordered monitoring.  2. Review goals of care. 1. Continue routine/ordered monitoring. 2. Review goals of care.

## 2019-09-07 NOTE — Progress Notes (Signed)
PROGRESS NOTE    Tyler [name]  WGN:[id number] DOB: June 11, 1971 DOA: 07/23/2019 PCP: Patient, No Pcp Per   Brief Narrative:  Patient is a 48 year old male with history of polysubstance abuse including heroin presented to [facility] ER after being found down with a syringe nearby. He was given Narcan with minimal improvement. On presentation,he was found to have severe rhabdomyolysis with acute kidney injury and hyperkalemia. He was started on IV fluids including bicarb drip and transferred to [facility] for further care. Rhabdomyolysis improved but he had persistence of encephalopathy and was found to have acute bilateral cerebellar infarct. Neurology was consulted and has signed off. PT recommends SNF placement.Prolonged hospitalization due to placement issues. Patient is medically stable for discharge to skilled nursing facility as soon as the bed is available.   Assessment & Plan:   Principal Problem:   Rhabdomyolysis Active Problems:   Acute kidney failure (HCC)   Acute metabolic encephalopathy   Polysubstance abuse (HCC)   Hyperkalemia   Transaminitis   HTN (hypertension)   Cerebral embolism with cerebral infarction   Protein-calorie malnutrition, severe   Acute encephalopathy: improved Unknown baseline but appears near new normal.  Does not follow commands.  Multifactorial: Most likely secondary CVA, drug use, uremia, heavy alcohol use He is alert and awake but not oriented -CT of the head and neck was initially negative for any traumatic finding -UDS, Tylenol and salicylate levels were negative. -TSH, folate, B12 levels all within normal limits -EEG: Generalized diffuse slowing but no seizure activity -Had required intermittent restraints initially during the hospitalization. -Fall precautions -Now alert, awake, calm, cooperative but still not oriented. Seems better than yesterday -Awaiting SNF placement.Difficult placement   Acute bilateral CVA, nonhemorrhagic  -MRI of the brain showed acute CVA -Neurology evaluated the patient and has signed off. MRA of the head negative. Ultrasound carotid showed bilateral 1 to 39% ICA stenosis -Neurology recommends considering 30-day cardiac event monitoring as outpatient to rule out A. fib if patient neurologically improves. -LDL 83; A1c 5.1. -Continue aspirin, not started on statin due to elevated LFTs. -Echocardiogram: EF of 60 to 65% -Lower extremity Dopplers: Negative for DVT - PT recommends SNF.  Severe rhabdomyolysis -AKI has resolved -Renal ultrasound negative -Off IV fluids  Transaminitis with elevated bilirubin stable -Right upper quadrant ultrasound showed hepatic steatosis with early cirrhosis with some gallbladder thickening -Most likely associated with chronic alcohol abuse with fatty liver -Continue to monitor  Alcohol abuse -Advised to quit -Continue thiamine, multivitamin and folic acid.  Urinary tract infection -Present on admission -Completed 5 days of Rocephin  Essential hypertension -On metoprolol -Continue to monitor blood pressure and adjust medications as needed.  Severe malnutrition -Poor oral intake -Follow nutrition recommendations   Nutrition Problem: Severe Malnutrition Etiology: acute illness(acute toxic/metabolic encephalopathy)      DVT prophylaxis: Lovenox Code Status: Full Family Communication: I called the daughter but she did not answer Disposition Plan: SNF as soon as bed is available.Difficult placement   Consultants: Neurology  Procedures:None  Antimicrobials:  Anti-infectives (From admission, onward)   Start     Dose/Rate Route Frequency Ordered Stop   07/25/19 0745  cefTRIAXone (ROCEPHIN) 1 g in sodium chloride 0.9 % 100 mL IVPB     1 g 200 mL/hr over 30 Minutes Intravenous Every 24 hours 07/25/19 0734 07/29/19 1518      Subjective:  Seen and examined sitting comfortably in chair without any acute complaints.No overnight  events. Patient is a poor historian and mainly only answers yes/no.  Objective: Vitals:   09/06/19 2124 09/07/19 0500 09/07/19 0600 09/07/19 1209  BP: (!) 144/91 121/62  111/80  Pulse: 72 66  66  Resp: 20 (!) 22 18 18   Temp: 97.8 F (36.6 C) 97.8 F (36.6 C)  97.8 F (36.6 C)  TempSrc:  Oral  Oral  SpO2: 100% 97%  100%  Weight:      Height:        Intake/Output Summary (Last 24 hours) at 09/07/2019 1329 Last data filed at 09/07/2019 0855 Gross per 24 hour  Intake 730 ml  Output -  Net 730 ml   Filed Weights   09/04/19 0500 09/05/19 0530 09/06/19 0652  Weight: 62.2 kg 63.4 kg 62.9 kg    Examination:  General exam: Appears calm and comfortable ,Not in distress, still pleasantly confused Respiratory system: Bilateral equal air entry, normal vesicular breath sounds, no wheezes or crackles  Cardiovascular system: S1 & S2 heard, RRR. No JVD, murmurs, rubs, gallops or clicks. Gastrointestinal system: Abdomen is nondistended, soft and nontender. No organomegaly or masses felt. Normal bowel sounds heard. Central nervous system: Alert and awake but not oriented x2.  Decreased muscle strength in left upper extremity but improved from yesterday.   Extremities: No edema, no clubbing ,no cyanosis, distal peripheral pulses palpable. Skin: No rashes, lesions or ulcers,no icterus ,no pallor Psychiatry: Judgement and insight appear impaired   Data Reviewed: I have personally reviewed following labs and imaging studies  CBC: Recent Labs  Lab 09/01/19 0241  WBC 7.0  NEUTROABS 3.4  HGB 12.8*  HCT 39.0  MCV 97.0  PLT 216   Basic Metabolic Panel: Recent Labs  Lab 09/01/19 0241 09/05/19 0331 09/07/19 0909  NA 140 136 142  K 4.6 3.9 4.3  CL 108 104 106  CO2 26 23 25   GLUCOSE 90 87 125*  BUN 31* 29* 23*  CREATININE 1.33* 1.27* 1.23  CALCIUM 9.1 8.7* 9.1   GFR: Estimated Creatinine Clearance: 63.9 mL/min (by C-G formula based on SCr of 1.23 mg/dL). Liver Function Tests:  Recent Labs  Lab 09/05/19 0331 09/07/19 0909  AST 89* 84*  ALT 109* 113*  ALKPHOS 65 77  BILITOT 0.6 0.9  PROT 6.8 7.6  ALBUMIN 3.3* 3.5   No results for input(s): LIPASE, AMYLASE in the last 168 hours. No results for input(s): AMMONIA in the last 168 hours. Coagulation Profile: No results for input(s): INR, PROTIME in the last 168 hours. Cardiac Enzymes: No results for input(s): CKTOTAL, CKMB, CKMBINDEX, TROPONINI in the last 168 hours. BNP (last 3 results) No results for input(s): PROBNP in the last 8760 hours. HbA1C: No results for input(s): HGBA1C in the last 72 hours. CBG: Recent Labs  Lab 09/06/19 1149 09/06/19 1701 09/06/19 2122 09/07/19 0750 09/07/19 1207  GLUCAP 118* 130* 93 77 100*   Lipid Profile: No results for input(s): CHOL, HDL, LDLCALC, TRIG, CHOLHDL, LDLDIRECT in the last 72 hours. Thyroid Function Tests: No results for input(s): TSH, T4TOTAL, FREET4, T3FREE, THYROIDAB in the last 72 hours. Anemia Panel: No results for input(s): VITAMINB12, FOLATE, FERRITIN, TIBC, IRON, RETICCTPCT in the last 72 hours. Sepsis Labs: No results for input(s): PROCALCITON, LATICACIDVEN in the last 168 hours.  No results found for this or any previous visit (from the past 240 hour(s)).       Radiology Studies: No results found.      Scheduled Meds: .  stroke: mapping our early stages of recovery book   Does not apply Once  . aspirin  325 mg Oral Daily  . enoxaparin (LOVENOX) injection  40 mg Subcutaneous Q24H  . feeding supplement (ENSURE ENLIVE)  237 mL Oral BID BM  . folic acid  1 mg Oral Daily  . insulin aspart  0-15 Units Subcutaneous TID WC  . insulin aspart  0-5 Units Subcutaneous QHS  . lactulose  20 g Oral BID  . metoprolol tartrate  25 mg Oral BID  . multivitamin with minerals  1 tablet Oral Daily  . thiamine  100 mg Oral Daily   Or  . thiamine  100 mg Intravenous Daily   Continuous Infusions:   LOS: 46 days    Time spent: 25 mins.More  than 50% of that time was spent in counseling and/or coordination of care.      Harold Hedge, DO [facility] Pager [phone]  If 7PM-7AM, please contact night-coverage [email or web address] Password [email or web address] 09/07/2019, 1:29 PM

## 2019-09-08 DIAGNOSIS — G9341 Metabolic encephalopathy: Secondary | ICD-10-CM | POA: Diagnosis not present

## 2019-09-08 LAB — GLUCOSE, CAPILLARY
Glucose-Capillary: 82 mg/dL (ref 70–99)
Glucose-Capillary: 142 mg/dL — ABNORMAL HIGH (ref 70–99)
Glucose-Capillary: 132 mg/dL — ABNORMAL HIGH (ref 70–99)
Glucose-Capillary: 94 mg/dL (ref 70–99)

## 2019-09-08 NOTE — Progress Notes (Signed)
PROGRESS NOTE    Tyler [name]  UEK:[id number] DOB: Jan 15, 1971 DOA: 07/23/2019 PCP: Patient, No Pcp Per   Brief Narrative:  Patient is a 48 year old male with history of polysubstance abuse including heroin presented to [facility] ER after being found down with a syringe nearby. He was given Narcan with minimal improvement. On presentation,he was found to have severe rhabdomyolysis with acute kidney injury and hyperkalemia. He was started on IV fluids including bicarb drip and transferred to [facility] for further care. Rhabdomyolysis improved but he had persistence of encephalopathy and was found to have acute bilateral cerebellar infarct. Neurology was consulted and has signed off. PT recommends SNF placement.Prolonged hospitalization due to placement issues. Patient is medically stable for discharge to skilled nursing facility as soon as the bed is available.   Assessment & Plan:   Principal Problem:   Rhabdomyolysis Active Problems:   Acute kidney failure (HCC)   Acute metabolic encephalopathy   Polysubstance abuse (HCC)   Hyperkalemia   Transaminitis   HTN (hypertension)   Cerebral embolism with cerebral infarction   Protein-calorie malnutrition, severe   Acute bilateral CVA, nonhemorrhagic, appears at new baseline MRI of the brain showed acute CVA Neurology evaluated the patient and has signed off. MRA of the head negative. Ultrasound carotid showed bilateral 1 to 39% ICA stenosis LDL 83; A1c 5.1. Echocardiogram: EF of 60 to 65% Lower extremity Dopplers: Negative for DVT -PT recommends SNF. -Neurology recommends considering 30-day cardiac event monitoring as outpatient to rule out A. fib if patient neurologically improves. -Continue aspirin, not started on statin due to elevated LFTs.  Acute encephalopathy: Resolved Multifactorial: Most likely secondary CVA, drug use, uremia, heavy alcohol use He is alert and awake but not oriented CT of the head and neck was  initially negative for any traumatic finding UDS, Tylenol and salicylate levels were negative. TSH, folate, B12 levels all within normal limits EEG: Generalized diffuse slowing but no seizure activity Had required intermittent restraints initially during the hospitalization. Fall precautions Now alert, awake, calm, cooperative but still not oriented.  - Awaiting SNF placement.Difficult placement   Severe rhabdomyolysis, resolved AKI has resolved Renal ultrasound negative Off IV fluids  Transaminitis with elevated bilirubin, stable  Most likely associated with chronic alcohol abuse with fatty liver Right upper quadrant ultrasound showed hepatic steatosis with early cirrhosis with some gallbladder thickening -Continue to monitor  Alcohol abuse Advised to quit -Continue thiamine, multivitamin and folic acid.  Urinary tract infection -Present on admission -Completed 5 days of Rocephin  Essential hypertension stable On metoprolol -Continue to monitor blood pressure and adjust medications as needed.  Severe malnutrition -Poor oral intake -Follow nutrition recommendations   Nutrition Problem: Severe Malnutrition Etiology: acute illness(acute toxic/metabolic encephalopathy)      DVT prophylaxis: Lovenox Code Status: Full Family Communication: I called the daughter but she did not answer Disposition Plan: SNF as soon as bed is available. Difficult placement   Consultants: Neurology  Procedures:None  Antimicrobials:  Anti-infectives (From admission, onward)   Start     Dose/Rate Route Frequency Ordered Stop   07/25/19 0745  cefTRIAXone (ROCEPHIN) 1 g in sodium chloride 0.9 % 100 mL IVPB     1 g 200 mL/hr over 30 Minutes Intravenous Every 24 hours 07/25/19 0734 07/29/19 1518      Subjective:  Patient is a poor historian and only answers yes/no.  Denies any complaints.  No overnight events.  Objective: Vitals:   09/07/19 2111 09/07/19 2119 09/08/19 0658  09/08/19 0912  BP:  96/76 96/76 98/64  105/72  Pulse: 77 76 60 64  Resp:  16 16   Temp:  97.6 F (36.4 C) 98.1 F (36.7 C)   TempSrc:  Oral Axillary   SpO2:  100%    Weight:   64.9 kg   Height:        Intake/Output Summary (Last 24 hours) at 09/08/2019 1536 Last data filed at 09/08/2019 0929 Gross per 24 hour  Intake 520 ml  Output 1350 ml  Net -830 ml   Filed Weights   09/05/19 0530 09/06/19 0652 09/08/19 0658  Weight: 63.4 kg 62.9 kg 64.9 kg    Examination:  General exam: Appears calm and comfortable ,Not in distress, still pleasantly confused Respiratory system: Bilateral equal air entry, normal vesicular breath sounds, no wheezes or crackles  Cardiovascular system: S1 & S2 heard, RRR. No JVD, murmurs, rubs, gallops or clicks. Gastrointestinal system: Abdomen is nondistended, soft and nontender. No organomegaly or masses felt. Normal bowel sounds heard. Central nervous system: Alert and awake but not oriented x2.  Decreased muscle strength in left upper extremity.  Left hemineglect Extremities: No edema, no clubbing ,no cyanosis, distal peripheral pulses palpable. Skin: No rashes, lesions or ulcers,no icterus ,no pallor Psychiatry: Judgement and insight appear impaired   Data Reviewed: I have personally reviewed following labs and imaging studies  CBC: No results for input(s): WBC, NEUTROABS, HGB, HCT, MCV, PLT in the last 168 hours. Basic Metabolic Panel: Recent Labs  Lab 09/05/19 0331 09/07/19 0909  NA 136 142  K 3.9 4.3  CL 104 106  CO2 23 25  GLUCOSE 87 125*  BUN 29* 23*  CREATININE 1.27* 1.23  CALCIUM 8.7* 9.1   GFR: Estimated Creatinine Clearance: 66 mL/min (by C-G formula based on SCr of 1.23 mg/dL). Liver Function Tests: Recent Labs  Lab 09/05/19 0331 09/07/19 0909  AST 89* 84*  ALT 109* 113*  ALKPHOS 65 77  BILITOT 0.6 0.9  PROT 6.8 7.6  ALBUMIN 3.3* 3.5   No results for input(s): LIPASE, AMYLASE in the last 168 hours. No results for  input(s): AMMONIA in the last 168 hours. Coagulation Profile: No results for input(s): INR, PROTIME in the last 168 hours. Cardiac Enzymes: No results for input(s): CKTOTAL, CKMB, CKMBINDEX, TROPONINI in the last 168 hours. BNP (last 3 results) No results for input(s): PROBNP in the last 8760 hours. HbA1C: No results for input(s): HGBA1C in the last 72 hours. CBG: Recent Labs  Lab 09/07/19 1207 09/07/19 1704 09/07/19 2104 09/08/19 0732 09/08/19 1137  GLUCAP 100* 98 133* 82 142*   Lipid Profile: No results for input(s): CHOL, HDL, LDLCALC, TRIG, CHOLHDL, LDLDIRECT in the last 72 hours. Thyroid Function Tests: No results for input(s): TSH, T4TOTAL, FREET4, T3FREE, THYROIDAB in the last 72 hours. Anemia Panel: No results for input(s): VITAMINB12, FOLATE, FERRITIN, TIBC, IRON, RETICCTPCT in the last 72 hours. Sepsis Labs: No results for input(s): PROCALCITON, LATICACIDVEN in the last 168 hours.  No results found for this or any previous visit (from the past 240 hour(s)).       Radiology Studies: No results found.      Scheduled Meds: .  stroke: mapping our early stages of recovery book   Does not apply Once  . aspirin  325 mg Oral Daily  . enoxaparin (LOVENOX) injection  40 mg Subcutaneous Q24H  . feeding supplement (ENSURE ENLIVE)  237 mL Oral BID BM  . folic acid  1 mg Oral Daily  . insulin aspart  0-15  Units Subcutaneous TID WC  . insulin aspart  0-5 Units Subcutaneous QHS  . lactulose  20 g Oral BID  . metoprolol tartrate  25 mg Oral BID  . multivitamin with minerals  1 tablet Oral Daily  . thiamine  100 mg Oral Daily   Or  . thiamine  100 mg Intravenous Daily   Continuous Infusions:   LOS: 47 days    Time spent: 20 mins.More than 50% of that time was spent in counseling and/or coordination of care.      Jae DireJared E Aubry Tucholski, DO [facility] Pager [phone]  If 7PM-7AM, please contact night-coverage [email or web address] Password [email or web address] 09/08/2019,  3:36 PM

## 2019-09-09 DIAGNOSIS — G9341 Metabolic encephalopathy: Secondary | ICD-10-CM | POA: Diagnosis not present

## 2019-09-09 LAB — GLUCOSE, CAPILLARY
Glucose-Capillary: 106 mg/dL — ABNORMAL HIGH (ref 70–99)
Glucose-Capillary: 145 mg/dL — ABNORMAL HIGH (ref 70–99)
Glucose-Capillary: 215 mg/dL — ABNORMAL HIGH (ref 70–99)
Glucose-Capillary: 78 mg/dL (ref 70–99)
Glucose-Capillary: 80 mg/dL (ref 70–99)

## 2019-09-09 NOTE — Progress Notes (Signed)
PROGRESS NOTE    Tyler [name]  [id number] DOB: 07/07/1971 DOA: 07/23/2019 PCP: Patient, No Pcp Per   Brief Narrative:  Patient is a 48 year old male with history of polysubstance abuse including heroin [facility] after being found down with a syringe nearby. He was given Narcan with minimal improvement. On presentation,he was found to have severe rhabdomyolysis with acute kidney injury and hyperkalemia. He was started on IV fluids including bicarb drip and [facility]. Rhabdomyolysis improved but he had persistence of encephalopathy and was found to have acute bilateral cerebellar infarct. Neurology was consulted and has signed off. PT recommends SNF placement.Prolonged hospitalization due to placement issues. Patient is medically stable for discharge to skilled nursing facility as soon as the bed is available.   Assessment & Plan:   Principal Problem:   Rhabdomyolysis Active Problems:   Acute kidney failure (HCC)   Acute metabolic encephalopathy   Polysubstance abuse (HCC)   Hyperkalemia   Transaminitis   HTN (hypertension)   Cerebral embolism with cerebral infarction   Protein-calorie malnutrition, severe   Acute bilateral CVA, nonhemorrhagic,at baseline MRI of the brain showed acute CVA Neurology evaluated the patient and has signed off. MRA of the head negative. Ultrasound carotid showed bilateral 1 to 39% ICA stenosis LDL 83; A1c 5.1. Echocardiogram: EF of 60 to 65% Lower extremity Dopplers: Negative for DVT -PT recommends SNF. -Neurology recommends considering 30-day cardiac event monitoring as outpatient to rule out A. fib if patient neurologically improves. -Continue aspirin, not started on statin due to elevated LFTs.  Acute encephalopathy: Resolved Multifactorial: Most likely secondary CVA, drug use, uremia, heavy alcohol use He is alert and awake but not oriented CT of the head and neck was initially  negative for any traumatic finding UDS, Tylenol and salicylate levels were negative. TSH, folate, B12 levels all within normal limits EEG: Generalized diffuse slowing but no seizure activity Had required intermittent restraints initially during the hospitalization. Fall precautions Now alert, awake, calm, cooperative but still not oriented.  - Awaiting SNF placement.Difficult placement   Severe rhabdomyolysis, resolved AKI has resolved Renal ultrasound negative Off IV fluids  Transaminitis with elevated bilirubin, stable  Most likely associated with chronic alcohol abuse with fatty liver Right upper quadrant ultrasound showed hepatic steatosis with early cirrhosis with some gallbladder thickening -Continue to monitor  Alcohol abuse Advised to quit -Continue thiamine, multivitamin and folic acid.  Urinary tract infection -Present on admission -Completed 5 days of Rocephin  Essential hypertension stable On metoprolol -Continue to monitor blood pressure and adjust medications as needed.  Severe malnutrition -Poor oral intake -Follow nutrition recommendations   Nutrition Problem: Severe Malnutrition Etiology: acute illness(acute toxic/metabolic encephalopathy)      DVT prophylaxis: Lovenox Code Status: Full Disposition Plan: SNF as soon as bed is available. Difficult placement   Consultants: Neurology  Procedures:None  Antimicrobials:  Anti-infectives (From admission, onward)   Start     Dose/Rate Route Frequency Ordered Stop   07/25/19 0745  cefTRIAXone (ROCEPHIN) 1 g in sodium chloride 0.9 % 100 mL IVPB     1 g 200 mL/hr over 30 Minutes Intravenous Every 24 hours 07/25/19 0734 07/29/19 1518      Subjective:  Patient is a poor historian and only answers yes/no.  Denies any complaints.  No overnight events.  Objective: Vitals:   09/09/19 0615 09/09/19 0800 09/09/19 1148 09/09/19 2153  BP: 127/82 (!) 124/96 112/80 117/88  Pulse: 75 64 69 80    Resp:  18 16 16 18   Temp: 99 F (37.2 C) 97.9 F (36.6 C) 98.4 F (36.9 C) 98.6 F (37 C)  TempSrc:  Oral Oral   SpO2: 100% 100% 100% 97%  Weight: 65.1 kg     Height:        Intake/Output Summary (Last 24 hours) at 09/09/2019 2205 Last data filed at 09/09/2019 1500 Gross per 24 hour  Intake 480 ml  Output --  Net 480 ml   Filed Weights   09/06/19 0652 09/08/19 0658 09/09/19 0615  Weight: 62.9 kg 64.9 kg 65.1 kg    Examination:  General exam: Appears calm and comfortable, follows commands much better today Respiratory system: Bilateral equal air entry, normal vesicular breath sounds, no wheezes or crackles  Cardiovascular system: S1 & S2 heard, RRR. No JVD, murmurs, rubs, gallops or clicks. Gastrointestinal system: Abdomen is nondistended, soft and nontender. No organomegaly or masses felt. Normal bowel sounds heard. Central nervous system: Alert and awake but not oriented x2.  Decreased muscle strength in left upper extremity.  Left hemineglect Extremities: No edema, no clubbing ,no cyanosis, distal peripheral pulses palpable. Skin: No rashes, lesions or ulcers,no icterus ,no pallor Psychiatry: Judgement and insight appear impaired   Data Reviewed: I have personally reviewed following labs and imaging studies  CBC: No results for input(s): WBC, NEUTROABS, HGB, HCT, MCV, PLT in the last 168 hours. Basic Metabolic Panel: Recent Labs  Lab 09/05/19 0331 09/07/19 0909  NA 136 142  K 3.9 4.3  CL 104 106  CO2 23 25  GLUCOSE 87 125*  BUN 29* 23*  CREATININE 1.27* 1.23  CALCIUM 8.7* 9.1   GFR: Estimated Creatinine Clearance: 66.2 mL/min (by C-G formula based on SCr of 1.23 mg/dL). Liver Function Tests: Recent Labs  Lab 09/05/19 0331 09/07/19 0909  AST 89* 84*  ALT 109* 113*  ALKPHOS 65 77  BILITOT 0.6 0.9  PROT 6.8 7.6  ALBUMIN 3.3* 3.5   No results for input(s): LIPASE, AMYLASE in the last 168 hours. No results for input(s): AMMONIA in the last 168  hours. Coagulation Profile: No results for input(s): INR, PROTIME in the last 168 hours. Cardiac Enzymes: No results for input(s): CKTOTAL, CKMB, CKMBINDEX, TROPONINI in the last 168 hours. BNP (last 3 results) No results for input(s): PROBNP in the last 8760 hours. HbA1C: No results for input(s): HGBA1C in the last 72 hours. CBG: Recent Labs  Lab 09/08/19 2140 09/09/19 0834 09/09/19 1152 09/09/19 1646 09/09/19 2152  GLUCAP 94 80 145* 78 215*   Lipid Profile: No results for input(s): CHOL, HDL, LDLCALC, TRIG, CHOLHDL, LDLDIRECT in the last 72 hours. Thyroid Function Tests: No results for input(s): TSH, T4TOTAL, FREET4, T3FREE, THYROIDAB in the last 72 hours. Anemia Panel: No results for input(s): VITAMINB12, FOLATE, FERRITIN, TIBC, IRON, RETICCTPCT in the last 72 hours. Sepsis Labs: No results for input(s): PROCALCITON, LATICACIDVEN in the last 168 hours.  No results found for this or any previous visit (from the past 240 hour(s)).       Radiology Studies: No results found.      Scheduled Meds:   stroke: mapping our early stages of recovery book   Does not apply Once   aspirin  325 mg Oral Daily   enoxaparin (LOVENOX) injection  40 mg Subcutaneous Q24H   feeding supplement (ENSURE ENLIVE)  237 mL Oral BID BM   folic acid  1 mg Oral Daily   insulin aspart  0-15 Units Subcutaneous TID WC   insulin aspart  0-5 Units Subcutaneous QHS   lactulose  20 g Oral BID   metoprolol tartrate  25 mg Oral BID   multivitamin with minerals  1 tablet Oral Daily   thiamine  100 mg Oral Daily   Or   thiamine  100 mg Intravenous Daily   Continuous Infusions:   LOS: 48 days    Time spent: 20 mins.More than 50% of that time was spent in counseling and/or coordination of care.      Harold Hedge, DO [facility] Pager [phone]  If 7PM-7AM, please contact night-coverage [email or web address] Password [email or web address] 09/09/2019, 10:05 PM

## 2019-09-10 DIAGNOSIS — G9341 Metabolic encephalopathy: Secondary | ICD-10-CM | POA: Diagnosis not present

## 2019-09-10 LAB — GLUCOSE, CAPILLARY
Glucose-Capillary: 79 mg/dL (ref 70–99)
Glucose-Capillary: 103 mg/dL — ABNORMAL HIGH (ref 70–99)
Glucose-Capillary: 160 mg/dL — ABNORMAL HIGH (ref 70–99)
Glucose-Capillary: 133 mg/dL — ABNORMAL HIGH (ref 70–99)

## 2019-09-10 NOTE — Plan of Care (Signed)
  Problem: Clinical Measurements: Goal: Ability to maintain clinical measurements within normal limits will improve Outcome: Progressing   Problem: Activity: Goal: Risk for activity intolerance will decrease Outcome: Progressing   Problem: Nutrition: Goal: Adequate nutrition will be maintained Outcome: Progressing   Problem: Clinical Measurements: Goal: Ability to maintain clinical measurements within normal limits will improve Outcome: Progressing Goal: Will remain free from infection Outcome: Progressing Goal: Diagnostic test results will improve Outcome: Progressing   Problem: Activity: Goal: Risk for activity intolerance will decrease Outcome: Progressing   Problem: Nutrition: Goal: Adequate nutrition will be maintained Outcome: Progressing   Problem: Health Behavior/Discharge Planning: Goal: Ability to manage health-related needs will improve Outcome: Progressing   Problem: Nutrition: Goal: Risk of aspiration will decrease Outcome: Progressing Goal: Dietary intake will improve Outcome: Progressing   Problem: Ischemic Stroke/TIA Tissue Perfusion: Goal: Complications of ischemic stroke/TIA will be minimized Outcome: Progressing   

## 2019-09-10 NOTE — Progress Notes (Signed)
   09/10/19 2135  MEWS Score  Resp 17  Pulse Rate 83  BP 102/75  Temp 98.6 F (37 C)  SpO2 99 %  MEWS Score  MEWS RR 0  MEWS Pulse 0  MEWS Systolic 0  MEWS LOC 0  MEWS Temp 0  MEWS Score 0  MEWS Score Color Green  MEWS Assessment  Is this an acute change? No  MEWS Guidelines - (patients age 50 and over)  Red - At High Risk for Deterioration Yellow - At risk for Deterioration  1. Go to room and assess patient 2. Validate data. Is this patient's baseline? If data confirmed: 3. Is this an acute change? 4. Administer prn meds/treatments as ordered. 5. Note Sepsis score 6. Review goals of care 7. Sports coach, RRT nurse and Provider. 8. Ask Provider to come to bedside.  9. Document patient condition/interventions/response. 10. Increase frequency of vital signs and focused assessments to at least q15 minutes x 4, then q30 minutes x2. - If stable, then q1h x3, then q4h x3 and then q8h or dept. routine. - If unstable, contact Provider & RRT nurse. Prepare for possible transfer. 11. Add entry in progress notes using the smart phrase ".MEWS". 1. Go to room and assess patient 2. Validate data. Is this patient's baseline? If data confirmed: 3. Is this an acute change? 4. Administer prn meds/treatments as ordered? 5. Note Sepsis score 6. Review goals of care 7. Sports coach and Provider 8. Call RRT nurse as needed. 9. Document patient condition/interventions/response. 10. Increase frequency of vital signs and focused assessments to at least q2h x2. - If stable, then q4h x2 and then q8h or dept. routine. - If unstable, contact Provider & RRT nurse. Prepare for possible transfer. 11. Add entry in progress notes using the smart phrase ".MEWS".  Green - Likely stable Lavender - Comfort Care Only  1. Continue routine/ordered monitoring.  2. Review goals of care. 1. Continue routine/ordered monitoring. 2. Review goals of care.

## 2019-09-10 NOTE — Progress Notes (Signed)
PROGRESS NOTE    Tyler [name]  ZOX:[id number] DOB: 10/09/1971 DOA: 07/23/2019 PCP: Patient, No Pcp Per   Brief Narrative:  Patient is a 48 year old male with history of polysubstance abuse including heroin presented to [facility] ER after being found down with a syringe nearby. He was given Narcan with minimal improvement. On presentation,he was found to have severe rhabdomyolysis with acute kidney injury and hyperkalemia. He was started on IV fluids including bicarb drip and transferred to [facility] for further care. Rhabdomyolysis improved but he had persistence of encephalopathy and was found to have acute bilateral cerebellar infarct. Neurology was consulted and has signed off. PT recommends SNF placement.Prolonged hospitalization due to placement issues. Patient is medically stable for discharge to skilled nursing facility as soon as the bed is available.   Assessment & Plan:   Principal Problem:   Rhabdomyolysis Active Problems:   Acute kidney failure (HCC)   Acute metabolic encephalopathy   Polysubstance abuse (HCC)   Hyperkalemia   Transaminitis   HTN (hypertension)   Cerebral embolism with cerebral infarction   Protein-calorie malnutrition, severe   Acute bilateral CVA, nonhemorrhagic,at baseline MRI of the brain showed acute CVA Neurology evaluated the patient and has signed off. MRA of the head negative. Ultrasound carotid showed bilateral 1 to 39% ICA stenosis LDL 83; A1c 5.1. Echocardiogram: EF of 60 to 65% Lower extremity Dopplers: Negative for DVT -PT recommends SNF. -Neurology recommends considering 30-day cardiac event monitoring as outpatient to rule out A. fib if patient neurologically improves. -Continue aspirin, not started on statin due to elevated LFTs.  Acute encephalopathy: Resolved Multifactorial: Most likely secondary CVA, drug use, uremia, heavy alcohol use He is alert and awake but not oriented CT of the head and neck was initially  negative for any traumatic finding UDS, Tylenol and salicylate levels were negative. TSH, folate, B12 levels all within normal limits EEG: Generalized diffuse slowing but no seizure activity Had required intermittent restraints initially during the hospitalization. Fall precautions Now alert, awake, calm, cooperative but still not oriented.  - Awaiting SNF placement.Difficult placement   Severe rhabdomyolysis, resolved AKI has resolved Renal ultrasound negative Off IV fluids  Transaminitis with elevated bilirubin, stable  Most likely associated with chronic alcohol abuse with fatty liver Right upper quadrant ultrasound showed hepatic steatosis with early cirrhosis with some gallbladder thickening -Continue to monitor  Alcohol abuse Advised to quit -Continue thiamine, multivitamin and folic acid.  Urinary tract infection -Present on admission -Completed 5 days of Rocephin  Essential hypertension stable On metoprolol -Continue to monitor blood pressure and adjust medications as needed.  Severe malnutrition -Poor oral intake -Follow nutrition recommendations   Nutrition Problem: Severe Malnutrition Etiology: acute illness(acute toxic/metabolic encephalopathy)      DVT prophylaxis: Lovenox Code Status: Full Disposition Plan: SNF as soon as bed is available. Difficult placement   Consultants: Neurology  Procedures:None  Antimicrobials:  Anti-infectives (From admission, onward)   Start     Dose/Rate Route Frequency Ordered Stop   07/25/19 0745  cefTRIAXone (ROCEPHIN) 1 g in sodium chloride 0.9 % 100 mL IVPB     1 g 200 mL/hr over 30 Minutes Intravenous Every 24 hours 07/25/19 0734 07/29/19 1518      Subjective:  Patient is a poor historian with only yes/no answers.  Objective: Vitals:   09/09/19 0800 09/09/19 1148 09/09/19 2153 09/10/19 0623  BP: (!) 124/96 112/80 117/88 104/82  Pulse: 64 69 80 73  Resp: 16 16 18 18   Temp: 97.9 F (36.6 C)  98.4 F (36.9 C) 98.6 F (37 C) 98.3 F (36.8 C)  TempSrc: Oral Oral  Oral  SpO2: 100% 100% 97% 100%  Weight:    64 kg  Height:        Intake/Output Summary (Last 24 hours) at 09/10/2019 1426 Last data filed at 09/10/2019 1355 Gross per 24 hour  Intake 960 ml  Output --  Net 960 ml   Filed Weights   09/08/19 0658 09/09/19 0615 09/10/19 0623  Weight: 64.9 kg 65.1 kg 64 kg    Examination:  General exam: Appears calm and comfortable, follows commands much better today Respiratory system: Bilateral equal air entry, normal vesicular breath sounds, no wheezes or crackles  Cardiovascular system: S1 & S2 heard, RRR. No JVD, murmurs, rubs, gallops or clicks. Gastrointestinal system: Abdomen is nondistended, soft and nontender. No organomegaly or masses felt. Normal bowel sounds heard. Central nervous system: Alert and awake but still not oriented x2.  Decreased muscle strength in left upper extremity.  Continues with left hemineglect.  Appears at baseline Extremities: No edema, no clubbing ,no cyanosis, distal peripheral pulses palpable. Skin: No rashes, lesions or ulcers,no icterus ,no pallor Psychiatry: Judgement and insight appear impaired   Data Reviewed: I have personally reviewed following labs and imaging studies  CBC: No results for input(s): WBC, NEUTROABS, HGB, HCT, MCV, PLT in the last 168 hours. Basic Metabolic Panel: Recent Labs  Lab 09/05/19 0331 09/07/19 0909  NA 136 142  K 3.9 4.3  CL 104 106  CO2 23 25  GLUCOSE 87 125*  BUN 29* 23*  CREATININE 1.27* 1.23  CALCIUM 8.7* 9.1   GFR: Estimated Creatinine Clearance: 65 mL/min (by C-G formula based on SCr of 1.23 mg/dL). Liver Function Tests: Recent Labs  Lab 09/05/19 0331 09/07/19 0909  AST 89* 84*  ALT 109* 113*  ALKPHOS 65 77  BILITOT 0.6 0.9  PROT 6.8 7.6  ALBUMIN 3.3* 3.5   No results for input(s): LIPASE, AMYLASE in the last 168 hours. No results for input(s): AMMONIA in the last 168  hours. Coagulation Profile: No results for input(s): INR, PROTIME in the last 168 hours. Cardiac Enzymes: No results for input(s): CKTOTAL, CKMB, CKMBINDEX, TROPONINI in the last 168 hours. BNP (last 3 results) No results for input(s): PROBNP in the last 8760 hours. HbA1C: No results for input(s): HGBA1C in the last 72 hours. CBG: Recent Labs  Lab 09/09/19 1646 09/09/19 2152 09/09/19 2344 09/10/19 0744 09/10/19 1138  GLUCAP 78 215* 106* 79 103*   Lipid Profile: No results for input(s): CHOL, HDL, LDLCALC, TRIG, CHOLHDL, LDLDIRECT in the last 72 hours. Thyroid Function Tests: No results for input(s): TSH, T4TOTAL, FREET4, T3FREE, THYROIDAB in the last 72 hours. Anemia Panel: No results for input(s): VITAMINB12, FOLATE, FERRITIN, TIBC, IRON, RETICCTPCT in the last 72 hours. Sepsis Labs: No results for input(s): PROCALCITON, LATICACIDVEN in the last 168 hours.  No results found for this or any previous visit (from the past 240 hour(s)).       Radiology Studies: No results found.      Scheduled Meds:   stroke: mapping our early stages of recovery book   Does not apply Once   aspirin  325 mg Oral Daily   enoxaparin (LOVENOX) injection  40 mg Subcutaneous Q24H   feeding supplement (ENSURE ENLIVE)  237 mL Oral BID BM   folic acid  1 mg Oral Daily   insulin aspart  0-15 Units Subcutaneous TID WC   insulin aspart  0-5 Units  Subcutaneous QHS   lactulose  20 g Oral BID   metoprolol tartrate  25 mg Oral BID   multivitamin with minerals  1 tablet Oral Daily   thiamine  100 mg Oral Daily   Or   thiamine  100 mg Intravenous Daily   Continuous Infusions:   LOS: 49 days    Time spent: 20 mins.More than 50% of that time was spent in counseling and/or coordination of care.      Jae Dire, DO [facility] Pager [phone]  If 7PM-7AM, please contact night-coverage [email or web address] Password [email or web address] 09/10/2019, 2:26 PM

## 2019-09-10 NOTE — Progress Notes (Signed)
Physical Therapy Treatment Patient Details Name: Tyler [name] MRN: [id number] DOB: [birth year] Today's Date: 09/10/2019    History of Present Illness 48 y/o male transferred from [place] for unresponsiveness, likely secondary to substance abuse. Pt with rhabdomyolysis and AKI. EEG revealed moderate diffuse encephalopathy. PMH includes polysubstance abuse and HTN.     PT Comments    Pt received in recliner, agreeable to participation in therapy. He required supervision/min guard transfers and min/min guard assist ambulation 500' without AD. Pt is very unsafe. Unaware of deficits. He does not attend to obstacles, running into doorway/corners without therapist intervention. Pt returned to recliner at end of session.    Follow Up Recommendations  SNF     Equipment Recommendations  None recommended by PT    Recommendations for Other Services       Precautions / Restrictions Precautions Precautions: Fall Precaution Comments: poor safety awareness    Mobility  Bed Mobility               General bed mobility comments: up in chair when PT arrived  Transfers Overall transfer level: Needs assistance Equipment used: None Transfers: Sit to/from Stand Sit to Stand: Supervision;Min guard         General transfer comment: sup/min guard safety  Ambulation/Gait Ambulation/Gait assistance: Min guard;Min assist Gait Distance (Feet): 500 Feet Assistive device: None Gait Pattern/deviations: Step-through pattern;Decreased stride length;Drifts right/left Gait velocity: variable   General Gait Details: Not attending to obstacles. Not following directional cues.   Stairs             Wheelchair Mobility    Modified Rankin (Stroke Patients Only) Modified Rankin (Stroke Patients Only) Pre-Morbid Rankin Score: No symptoms Modified Rankin: Moderate disability     Balance Overall balance assessment: Needs assistance Sitting-balance support: No upper extremity  supported;Feet supported Sitting balance-Leahy Scale: Good     Standing balance support: No upper extremity supported;During functional activity Standing balance-Leahy Scale: Fair                              Cognition Arousal/Alertness: Awake/alert Behavior During Therapy: Impulsive Overall Cognitive Status: Impaired/Different from baseline Area of Impairment: Problem solving;Awareness;Safety/judgement;Following commands;Memory;Orientation;Attention                 Orientation Level: Disoriented to;Situation;Time Current Attention Level: Selective Memory: Decreased short-term memory Following Commands: Follows one step commands inconsistently;Follows one step commands with increased time Safety/Judgement: Decreased awareness of safety;Decreased awareness of deficits Awareness: Intellectual Problem Solving: Slow processing;Requires verbal cues;Difficulty sequencing;Requires tactile cues General Comments: Pt laughs off his confusion. Laughing in response to running in to obstacles, laughing with attempts to redirect during session.      Exercises      General Comments        Pertinent Vitals/Pain Pain Assessment: No/denies pain    Home Living                      Prior Function            PT Goals (current goals can now be found in the care plan section) Acute Rehab PT Goals Patient Stated Goal: not stated PT Goal Formulation: Patient unable to participate in goal setting Time For Goal Achievement: 09/21/19 Potential to Achieve Goals: Fair Progress towards PT goals: Progressing toward goals    Frequency    Min 2X/week      PT Plan Current plan remains appropriate  Co-evaluation              AM-PAC PT "6 Clicks" Mobility   Outcome Measure  Help needed turning from your back to your side while in a flat bed without using bedrails?: None Help needed moving from lying on your back to sitting on the side of a flat bed  without using bedrails?: None Help needed moving to and from a bed to a chair (including a wheelchair)?: None Help needed standing up from a chair using your arms (e.g., wheelchair or bedside chair)?: None Help needed to walk in hospital room?: A Little Help needed climbing 3-5 steps with a railing? : A Little 6 Click Score: 22    End of Session Equipment Utilized During Treatment: Gait belt Activity Tolerance: Patient tolerated treatment well Patient left: in chair;with call bell/phone within reach;with chair alarm set Nurse Communication: Mobility status PT Visit Diagnosis: Muscle weakness (generalized) (M62.81);Difficulty in walking, not elsewhere classified (R26.2);Other abnormalities of gait and mobility (R26.89)     Time: 1026-1040 PT Time Calculation (min) (ACUTE ONLY): 14 min  Charges:  $Gait Training: 8-22 mins                     Aida Raider, PT  Office # [phone] Pager [phone]    Ilda Foil 09/10/2019, 11:12 AM

## 2019-09-11 DIAGNOSIS — G9341 Metabolic encephalopathy: Secondary | ICD-10-CM | POA: Diagnosis not present

## 2019-09-11 LAB — GLUCOSE, CAPILLARY
Glucose-Capillary: 87 mg/dL (ref 70–99)
Glucose-Capillary: 73 mg/dL (ref 70–99)
Glucose-Capillary: 93 mg/dL (ref 70–99)
Glucose-Capillary: 94 mg/dL (ref 70–99)

## 2019-09-11 LAB — BASIC METABOLIC PANEL
Anion gap: 12 (ref 5–15)
BUN: 26 mg/dL — ABNORMAL HIGH (ref 6–20)
CO2: 25 mmol/L (ref 22–32)
Calcium: 9.4 mg/dL (ref 8.9–10.3)
Chloride: 105 mmol/L (ref 98–111)
Creatinine, Ser: 1.25 mg/dL — ABNORMAL HIGH (ref 0.61–1.24)
GFR calc Af Amer: >60 mL/min (ref >60)
GFR calc non Af Amer: >60 mL/min (ref >60)
Glucose, Bld: 93 mg/dL (ref 70–99)
Potassium: 4.4 mmol/L (ref 3.5–5.1)
Sodium: 142 mmol/L (ref 135–145)

## 2019-09-11 LAB — CBC
HCT: 38.5 % — ABNORMAL LOW (ref 39.0–52.0)
Hemoglobin: 12.8 g/dL — ABNORMAL LOW (ref 13.0–17.0)
MCH: 32.2 pg (ref 26.0–34.0)
MCHC: 33.2 g/dL (ref 30.0–36.0)
MCV: 97 fL (ref 80.0–100.0)
Platelets: 226 10*3/uL (ref 150–400)
RBC: 3.97 MIL/uL — ABNORMAL LOW (ref 4.22–5.81)
RDW: 13.7 % (ref 11.5–15.5)
WBC: 7.6 10*3/uL (ref 4.0–10.5)
nRBC: 0 % (ref 0.0–0.2)

## 2019-09-11 NOTE — Plan of Care (Signed)
  Problem: Clinical Measurements: Goal: Ability to maintain clinical measurements within normal limits will improve Outcome: Progressing   Problem: Activity: Goal: Risk for activity intolerance will decrease Outcome: Progressing   Problem: Nutrition: Goal: Adequate nutrition will be maintained Outcome: Progressing   Problem: Clinical Measurements: Goal: Ability to maintain clinical measurements within normal limits will improve Outcome: Progressing Goal: Will remain free from infection Outcome: Progressing Goal: Diagnostic test results will improve Outcome: Progressing   Problem: Activity: Goal: Risk for activity intolerance will decrease Outcome: Progressing   Problem: Nutrition: Goal: Adequate nutrition will be maintained Outcome: Progressing   Problem: Health Behavior/Discharge Planning: Goal: Ability to manage health-related needs will improve Outcome: Not Progressing Note: Confusion   Problem: Nutrition: Goal: Risk of aspiration will decrease Outcome: Progressing Goal: Dietary intake will improve Outcome: Progressing   Problem: Ischemic Stroke/TIA Tissue Perfusion: Goal: Complications of ischemic stroke/TIA will be minimized Outcome: Progressing   

## 2019-09-11 NOTE — Progress Notes (Signed)
PROGRESS NOTE    Tyler [name]  JSE:[id number] DOB: 06/23/1971 DOA: 07/23/2019 PCP: Patient, No Pcp Per   Brief Narrative:  Patient is a 48 year old male with history of polysubstance abuse including heroin presented to [facility] ER after being found down with a syringe nearby. He was given Narcan with minimal improvement. On presentation,he was found to have severe rhabdomyolysis with acute kidney injury and hyperkalemia. He was started on IV fluids including bicarb drip and transferred to [facility] for further care. Rhabdomyolysis improved but he had persistence of encephalopathy and was found to have acute bilateral cerebellar infarct. Neurology was consulted and has signed off. PT recommends SNF placement.Prolonged hospitalization due to placement issues. Patient is medically stable for discharge to skilled nursing facility as soon as the bed is available.   Assessment & Plan:   Principal Problem:   Rhabdomyolysis Active Problems:   Acute kidney failure (HCC)   Acute metabolic encephalopathy   Polysubstance abuse (HCC)   Hyperkalemia   Transaminitis   HTN (hypertension)   Cerebral embolism with cerebral infarction   Protein-calorie malnutrition, severe   Acute bilateral CVA, nonhemorrhagic,at baseline MRI of the brain showed acute CVA Neurology evaluated the patient and has signed off. MRA of the head negative. Ultrasound carotid showed bilateral 1 to 39% ICA stenosis LDL 83; A1c 5.1. Echocardiogram: EF of 60 to 65% Lower extremity Dopplers: Negative for DVT -PT recommends SNF. -Neurology recommends considering 30-day cardiac event monitoring as outpatient to rule out A. fib if patient neurologically improves. -Continue aspirin, not started on statin due to elevated LFTs.  Acute encephalopathy: Multifactorial: Most likely secondary CVA, drug use, uremia, heavy alcohol use He is alert and awake but not oriented CT of the head and neck was initially negative for any  traumatic finding UDS, Tylenol and salicylate levels were negative. TSH, folate, B12 levels all within normal limits EEG: Generalized diffuse slowing but no seizure activity Had required intermittent restraints initially during the hospitalization. Fall precautions Now alert, awake, calm, cooperative but still not oriented.  - Awaiting SNF placement.Difficult placement   Severe rhabdomyolysis, resolved AKI has resolved Renal ultrasound negative Off IV fluids  Transaminitis with elevated bilirubin, stable  Most likely associated with chronic alcohol abuse with fatty liver Right upper quadrant ultrasound showed hepatic steatosis with early cirrhosis with some gallbladder thickening -Continue to monitor  Alcohol abuse Advised to quit -Continue thiamine, multivitamin and folic acid.  Urinary tract infection -Present on admission -Completed 5 days of Rocephin  Essential hypertension stable On metoprolol -Continue to monitor blood pressure and adjust medications as needed.  Severe malnutrition -Poor oral intake -Follow nutrition recommendations   Nutrition Problem: Severe Malnutrition Etiology: acute illness(acute toxic/metabolic encephalopathy)      DVT prophylaxis: Lovenox Code Status: Full Disposition Plan: SNF as soon as bed is available. Difficult placement   Consultants: Neurology  Procedures:None  Antimicrobials:  Anti-infectives (From admission, onward)   Start     Dose/Rate Route Frequency Ordered Stop   07/25/19 0745  cefTRIAXone (ROCEPHIN) 1 g in sodium chloride 0.9 % 100 mL IVPB     1 g 200 mL/hr over 30 Minutes Intravenous Every 24 hours 07/25/19 0734 07/29/19 1518      Subjective:  Patient is a poor historian but more talkative today. Still with yes/no answers for the most part. No complaints. Review of systems negative.  Objective: Vitals:   09/10/19 2135 09/11/19 0605 09/11/19 0912 09/11/19 1229  BP: 102/75 103/75 136/85 132/88   Pulse: 83 72 74  66  Resp: 17   19  Temp: 98.6 F (37 C) 97.7 F (36.5 C)  98.9 F (37.2 C)  TempSrc: Oral Oral  Oral  SpO2: 99% 100%    Weight:  64.5 kg    Height:        Intake/Output Summary (Last 24 hours) at 09/11/2019 1814 Last data filed at 09/11/2019 0845 Gross per 24 hour  Intake -  Output 620 ml  Net -620 ml   Filed Weights   09/09/19 0615 09/10/19 0623 09/11/19 0605  Weight: 65.1 kg 64 kg 64.5 kg    Examination:  General exam: Appears calm and comfortable, follows commands better today and laughing Respiratory system: Bilateral equal air entry, normal vesicular breath sounds, no wheezes or crackles  Cardiovascular system: S1 & S2 heard, RRR. No JVD, murmurs, rubs, gallops or clicks. Gastrointestinal system: Abdomen is nondistended, soft and nontender. No organomegaly or masses felt. Normal bowel sounds heard. Central nervous system: Alert and awake but still not oriented x2.  Decreased muscle strength in left upper extremity.  Continues with left hemineglect.  Appears at baseline Extremities: No edema, no clubbing ,no cyanosis, distal peripheral pulses palpable. Good grip strength. Skin: No rashes, lesions or ulcers,no icterus ,no pallor Psychiatry: Judgement and insight appear impaired   Data Reviewed: I have personally reviewed following labs and imaging studies  CBC: Recent Labs  Lab 09/11/19 0228  WBC 7.6  HGB 12.8*  HCT 38.5*  MCV 97.0  PLT 226   Basic Metabolic Panel: Recent Labs  Lab 09/05/19 0331 09/07/19 0909 09/11/19 0228  NA 136 142 142  K 3.9 4.3 4.4  CL 104 106 105  CO2 23 25 25   GLUCOSE 87 125* 93  BUN 29* 23* 26*  CREATININE 1.27* 1.23 1.25*  CALCIUM 8.7* 9.1 9.4   GFR: Estimated Creatinine Clearance: 64.5 mL/min (A) (by C-G formula based on SCr of 1.25 mg/dL (H)). Liver Function Tests: Recent Labs  Lab 09/05/19 0331 09/07/19 0909  AST 89* 84*  ALT 109* 113*  ALKPHOS 65 77  BILITOT 0.6 0.9  PROT 6.8 7.6  ALBUMIN 3.3*  3.5   No results for input(s): LIPASE, AMYLASE in the last 168 hours. No results for input(s): AMMONIA in the last 168 hours. Coagulation Profile: No results for input(s): INR, PROTIME in the last 168 hours. Cardiac Enzymes: No results for input(s): CKTOTAL, CKMB, CKMBINDEX, TROPONINI in the last 168 hours. BNP (last 3 results) No results for input(s): PROBNP in the last 8760 hours. HbA1C: No results for input(s): HGBA1C in the last 72 hours. CBG: Recent Labs  Lab 09/10/19 1618 09/10/19 2136 09/11/19 0818 09/11/19 1204 09/11/19 1730  GLUCAP 160* 133* 87 73 93   Lipid Profile: No results for input(s): CHOL, HDL, LDLCALC, TRIG, CHOLHDL, LDLDIRECT in the last 72 hours. Thyroid Function Tests: No results for input(s): TSH, T4TOTAL, FREET4, T3FREE, THYROIDAB in the last 72 hours. Anemia Panel: No results for input(s): VITAMINB12, FOLATE, FERRITIN, TIBC, IRON, RETICCTPCT in the last 72 hours. Sepsis Labs: No results for input(s): PROCALCITON, LATICACIDVEN in the last 168 hours.  No results found for this or any previous visit (from the past 240 hour(s)).       Radiology Studies: No results found.      Scheduled Meds: .  stroke: mapping our early stages of recovery book   Does not apply Once  . aspirin  325 mg Oral Daily  . enoxaparin (LOVENOX) injection  40 mg Subcutaneous Q24H  . feeding supplement (  ENSURE ENLIVE)  237 mL Oral BID BM  . folic acid  1 mg Oral Daily  . insulin aspart  0-15 Units Subcutaneous TID WC  . insulin aspart  0-5 Units Subcutaneous QHS  . lactulose  20 g Oral BID  . metoprolol tartrate  25 mg Oral BID  . multivitamin with minerals  1 tablet Oral Daily  . thiamine  100 mg Oral Daily   Or  . thiamine  100 mg Intravenous Daily   Continuous Infusions:   LOS: 50 days    Time spent: 20 mins.More than 50% of that time was spent in counseling and/or coordination of care.      Jae Dire, DO [facility] Pager [phone]   If 7PM-7AM, please contact night-coverage [email or web address] Password [email or web address] 09/11/2019, 6:14 PM

## 2019-09-11 NOTE — Progress Notes (Signed)
Nutrition Follow-up  RD working remotely.  DOCUMENTATION CODES:   Severe malnutrition in context of acute illness/injury  INTERVENTION:   -Continue MVI with minerals daily -ContinueMagic cup TID with meals, each supplement provides 290 kcal and 9 grams of protein -ContinueEnsure Enlive po BID, each supplement provides 350 kcal and 20 grams of protein  NUTRITION DIAGNOSIS:   Severe Malnutrition related to acute illness(acute toxic/metabolic encephalopathy) as evidenced by moderate fat depletion, severe fat depletion, moderate muscle depletion, severe muscle depletion, energy intake < or equal to 50% for > or equal to 5 days, percent weight loss.  Ongoing  GOAL:   Patient will meet greater than or equal to 90% of their needs  Progressing   MONITOR:   PO intake, Supplement acceptance, Labs, Weight trends, Skin, I & O's  REASON FOR ASSESSMENT:   LOS    ASSESSMENT:   Tyler [name] is a 48 y.o. male with medical history significant of polysubstance abuse including heroin.  Brought in to [facility] ED just before MN after being found down by friend last evening with nearby syringe.  Reviewed I/O's: +420 ml x 24 hours and + 2 L since 08/28/19  UOP: 300 ml x 24 hours  Pt remains with good appetite. Meal completion 100%. Pt is also consuming Ensure supplements per MAR.   Wt has been stable over the past month.   Per CSW notes, pt remains difficult to place. Awaiting SNF placement for discharge.   Medications reviewed and include lactulose, folvite, and thiamine.   Labs reviewed: CBGS: 87-160 (inpatient orders for glycemic control are 0-15 units insulin aspart TID with meals and 0-5 units insulin aspart q HS).   Diet Order:   Diet Order            DIET DYS 3 Room service appropriate? No; Fluid consistency: Thin  Diet effective now              EDUCATION NEEDS:   Not appropriate for education at this time  Skin:  Skin Assessment: Reviewed RN Assessment  Last BM:   09/09/19  Height:   Ht Readings from Last 1 Encounters:  08/09/19 5\' 8"  (1.727 m)    Weight:   Wt Readings from Last 1 Encounters:  09/11/19 64.5 kg    Ideal Body Weight:  70 kg  BMI:  Body mass index is 21.62 kg/m.  Estimated Nutritional Needs:   Kcal:  2000-2200  Protein:  105-120 grams  Fluid:  > 2 L    Nohelani Benning A. Jimmye Norman, RD, LDN, [place] Registered Dietitian II Certified Diabetes Care and Education Specialist Pager: [phone] After hours Pager: [phone]

## 2019-09-11 NOTE — Plan of Care (Signed)
  Problem: Nutrition: Goal: Adequate nutrition will be maintained Outcome: Progressing   Problem: Clinical Measurements: Goal: Will remain free from infection Outcome: Progressing   

## 2019-09-11 NOTE — Progress Notes (Signed)
   09/11/19 2118  MEWS Score  Resp 16  Pulse Rate 73  BP 106/78  Temp 97.8 F (36.6 C)  SpO2 99 %  O2 Device Room Air  MEWS Score  MEWS RR 0  MEWS Pulse 0  MEWS Systolic 0  MEWS LOC 0  MEWS Temp 0  MEWS Score 0  MEWS Score Color Green  MEWS Assessment  Is this an acute change? No  MEWS Guidelines - (patients age 50 and over)  Red - At High Risk for Deterioration Yellow - At risk for Deterioration  1. Go to room and assess patient 2. Validate data. Is this patient's baseline? If data confirmed: 3. Is this an acute change? 4. Administer prn meds/treatments as ordered. 5. Note Sepsis score 6. Review goals of care 7. Sports coach, RRT nurse and Provider. 8. Ask Provider to come to bedside.  9. Document patient condition/interventions/response. 10. Increase frequency of vital signs and focused assessments to at least q15 minutes x 4, then q30 minutes x2. - If stable, then q1h x3, then q4h x3 and then q8h or dept. routine. - If unstable, contact Provider & RRT nurse. Prepare for possible transfer. 11. Add entry in progress notes using the smart phrase ".MEWS". 1. Go to room and assess patient 2. Validate data. Is this patient's baseline? If data confirmed: 3. Is this an acute change? 4. Administer prn meds/treatments as ordered? 5. Note Sepsis score 6. Review goals of care 7. Sports coach and Provider 8. Call RRT nurse as needed. 9. Document patient condition/interventions/response. 10. Increase frequency of vital signs and focused assessments to at least q2h x2. - If stable, then q4h x2 and then q8h or dept. routine. - If unstable, contact Provider & RRT nurse. Prepare for possible transfer. 11. Add entry in progress notes using the smart phrase ".MEWS".  Green - Likely stable Lavender - Comfort Care Only  1. Continue routine/ordered monitoring.  2. Review goals of care. 1. Continue routine/ordered monitoring. 2. Review goals of care.

## 2019-09-12 DIAGNOSIS — G9341 Metabolic encephalopathy: Secondary | ICD-10-CM

## 2019-09-12 DIAGNOSIS — I6340 Cerebral infarction due to embolism of unspecified cerebral artery: Secondary | ICD-10-CM

## 2019-09-12 LAB — GLUCOSE, CAPILLARY
Glucose-Capillary: 107 mg/dL — ABNORMAL HIGH (ref 70–99)
Glucose-Capillary: 145 mg/dL — ABNORMAL HIGH (ref 70–99)
Glucose-Capillary: 165 mg/dL — ABNORMAL HIGH (ref 70–99)
Glucose-Capillary: 122 mg/dL — ABNORMAL HIGH (ref 70–99)

## 2019-09-12 NOTE — Progress Notes (Signed)
Physical Therapy Treatment Patient Details Name: Tyler [name] MRN: [id number] DOB: 01/20/1971 Today's Date: 09/12/2019    History of Present Illness 48 y/o male transferred from [place] for unresponsiveness, likely secondary to substance abuse. Pt with rhabdomyolysis and AKI. EEG revealed moderate diffuse encephalopathy. PMH includes polysubstance abuse and HTN. Imaging show acute bialteral cerebellar infarcts.     PT Comments    Pt continues to be oriented to self only, completely unaware of deficits with associated decreased awareness of safety. Ambulating hallway distances with min assist, demonstrating balance impairments. Also with probable left visual field deficits, but difficult to formally assess due to mentation. Continue to recommend SNF for ongoing Physical Therapy.       Follow Up Recommendations  SNF;Supervision/Assistance - 24 hour     Equipment Recommendations  None recommended by PT    Recommendations for Other Services       Precautions / Restrictions Precautions Precautions: Fall Precaution Comments: poor safety awareness Restrictions Weight Bearing Restrictions: No    Mobility  Bed Mobility               General bed mobility comments: OOB in chair  Transfers Overall transfer level: Needs assistance Equipment used: None Transfers: Sit to/from Stand Sit to Stand: Supervision            Ambulation/Gait Ambulation/Gait assistance: Min assist Gait Distance (Feet): 200 Feet Assistive device: None Gait Pattern/deviations: Step-through pattern;Decreased stride length;Drifts right/left     General Gait Details: Not attending to obstacles or following directional, multimodal cues. Unable to way find   Stairs             Wheelchair Mobility    Modified Rankin (Stroke Patients Only) Modified Rankin (Stroke Patients Only) Pre-Morbid Rankin Score: No symptoms Modified Rankin: Moderately severe disability     Balance  Overall balance assessment: Needs assistance   Sitting balance-Leahy Scale: Normal       Standing balance-Leahy Scale: Fair                              Cognition Arousal/Alertness: Awake/alert Behavior During Therapy: Impulsive Overall Cognitive Status: Impaired/Different from baseline Area of Impairment: Problem solving;Awareness;Safety/judgement;Following commands;Memory;Orientation;Attention                 Orientation Level: Disoriented to;Situation;Time;Place Current Attention Level: Selective Memory: Decreased short-term memory Following Commands: Follows one step commands inconsistently;Follows one step commands with increased time Safety/Judgement: Decreased awareness of safety;Decreased awareness of deficits Awareness: Intellectual Problem Solving: Slow processing;Requires verbal cues;Difficulty sequencing;Requires tactile cues General Comments: Oriented to self only, decreased safety awareness, tends to laugh off confusion and at times not respond, no appreciable short term memory      Exercises      General Comments        Pertinent Vitals/Pain Pain Assessment: Faces Faces Pain Scale: No hurt    Home Living                      Prior Function            PT Goals (current goals can now be found in the care plan section) Acute Rehab PT Goals Patient Stated Goal: not stated Potential to Achieve Goals: Fair    Frequency    Min 2X/week      PT Plan Current plan remains appropriate    Co-evaluation  AM-PAC PT "6 Clicks" Mobility   Outcome Measure  Help needed turning from your back to your side while in a flat bed without using bedrails?: None Help needed moving from lying on your back to sitting on the side of a flat bed without using bedrails?: None Help needed moving to and from a bed to a chair (including a wheelchair)?: None Help needed standing up from a chair using your arms (e.g., wheelchair  or bedside chair)?: None Help needed to walk in hospital room?: None Help needed climbing 3-5 steps with a railing? : A Little 6 Click Score: 23    End of Session Equipment Utilized During Treatment: Gait belt Activity Tolerance: Patient tolerated treatment well Patient left: in chair;with call bell/phone within reach;with chair alarm set Nurse Communication: Mobility status PT Visit Diagnosis: Muscle weakness (generalized) (M62.81);Difficulty in walking, not elsewhere classified (R26.2);Other abnormalities of gait and mobility (R26.89)     Time: 1017-5102 PT Time Calculation (min) (ACUTE ONLY): 12 min  Charges:  $Therapeutic Activity: 8-22 mins                     Laurina Bustle, PT, DPT Acute Rehabilitation Services Pager [phone] Office [phone]    Vanetta Mulders 09/12/2019, 3:00 PM

## 2019-09-12 NOTE — Progress Notes (Signed)
Patient ID: Tyler [name], male   DOB: 09/22/1971, 48 y.o.   MRN: [id number]  PROGRESS NOTE    Tyler [name]  OVZ:[id number] DOB: 31-Mar-1971 DOA: 07/23/2019 PCP: Patient, No Pcp Per   Brief Narrative:  48 year old male with history of polysubstance abuse including heroine presented to [facility] ER after being found down with a syringe nearby.  He was given Narcan with minimal improvement.  On presentation, he was found to have severe rhabdomyolysis with acute kidney injury and hyperkalemia.  He was started on IV fluids including bicarb drip and transferred to [facility] for further care.  Rhabdomyolysis improved but he had persistence of encephalopathy and was found to have acute bilateral cerebellar infarct.  Neurology was consulted and has subsequently signed off.  PT recommended SNF placement.  Prolonged hospitalization due to placement issues.  Patient is medically stable for discharge to a skilled nursing facility as soon as bed is available.  Assessment & Plan:  Acute bilateral CVA, nonhemorrhagic -MRI of the brain showed acute CVA -Neurology evaluated the patient and has signed off.  MRA of the head negative. -Ultrasound carotid showed bilateral 1 to 39% ICA stenosis -LDL 83 -A1c 5.1 -Echo showed EF of 60 to 65%.  Lower extremity Dopplers: Negative for DVT -Neurology recommended considering 30-day cardiac event monitoring as outpatient to rule out A. fib if patient neurologically improves -Continue statin.  Not on statin due to elevated LFTs -PT recommended SNF.  Awaiting SNF placement.  Acute encephalopathy -Multifactorial: Most likely secondary CVA, drug use, uremia, heavy alcohol use -CT of the head and neck was initially negative for any traumatic finding -UDS, Tylenol and salicylate levels were negative. -TSH, folate, B12 levels all within normal limits -EEG: Generalized diffuse slowing but no seizure activity -Had required intermittent restraints initially during the  hospitalization. -Fall precautions - Awaiting SNF placement.Difficult placement  -Waxing and waning mental status.  Still not fully oriented  Severe rhabdomyolysis/AKI -Resolved with IV fluids.  Renal ultrasound negative.  Off IV fluids  Transaminitis with elevated bilirubin -Probably from alcohol abuse -Right upper quadrant ultrasound showed hepatic steatosis with early cirrhosis with some gallbladder thickening -Monitor intermittently  Alcohol abuse -Advised to quit.  Continue thiamine, multivitamin and folic acid  UTI -Present on admission.  Completed 5 days of Rocephin  Essential hypertension -Stable.  Continue metoprolol.  Severe malnutrition -Follow nutrition recommendations    DVT prophylaxis: Lovenox Code Status: Full Family Communication: None at bedside Disposition Plan: SNF once bed is available  Consultants: Neurology  Procedures:  Echo IMPRESSIONS   1. The left ventricle has normal systolic function with an ejection fraction of 60-65%. The cavity size was normal. Left ventricular diastolic Doppler parameters are consistent with impaired relaxation. No evidence of left ventricular regional wall  motion abnormalities. 2. The right ventricle has normal systolic function. The cavity was normal. There is no increase in right ventricular wall thickness. Right ventricular systolic pressure is normal with an estimated pressure of 21.0 mmHg. 3. The aortic valve is tricuspid. Moderate sclerosis of the aortic valve. 4. The aorta is normal unless otherwise noted.  Antimicrobials:  Anti-infectives (From admission, onward)   Start     Dose/Rate Route Frequency Ordered Stop   07/25/19 0745  cefTRIAXone (ROCEPHIN) 1 g in sodium chloride 0.9 % 100 mL IVPB     1 g 200 mL/hr over 30 Minutes Intravenous Every 24 hours 07/25/19 0734 07/29/19 1518       Subjective: Patient seen and examined at bedside.  Very poor  historian.  Wakes up very slightly, Patient hardly  participates in any conversation.  No overnight fever or vomiting reported.    Objective: Vitals:   09/11/19 1229 09/11/19 2118 09/12/19 0530 09/12/19 0854  BP: 132/88 106/78 (!) 104/59 (!) 130/99  Pulse: 66 73 66   Resp: 19 16 16    Temp: 98.9 F (37.2 C) 97.8 F (36.6 C) (!) 97.5 F (36.4 C)   TempSrc: Oral Oral Oral   SpO2:  99% 100%   Weight:      Height:        Intake/Output Summary (Last 24 hours) at 09/12/2019 1150 Last data filed at 09/12/2019 0024 Gross per 24 hour  Intake -  Output 400 ml  Net -400 ml   Filed Weights   09/09/19 0615 09/10/19 0623 09/11/19 0605  Weight: 65.1 kg 64 kg 64.5 kg    Examination: General exam: No distress.    With a very slightly, does not communicate much. Respiratory system: Bilateral decreased breath sounds at bases  cardiovascular system: S1-S2 heard, rate controlled gastrointestinal system:Abdomen is nondistended, soft and nontender. Normal bowel sounds heard. Extremities: No cyanosis, edema    Data Reviewed: I have personally reviewed following labs and imaging studies  CBC: Recent Labs  Lab 09/11/19 0228  WBC 7.6  HGB 12.8*  HCT 38.5*  MCV 97.0  PLT 226   Basic Metabolic Panel: Recent Labs  Lab 09/07/19 0909 09/11/19 0228  NA 142 142  K 4.3 4.4  CL 106 105  CO2 25 25  GLUCOSE 125* 93  BUN 23* 26*  CREATININE 1.23 1.25*  CALCIUM 9.1 9.4   GFR: Estimated Creatinine Clearance: 64.5 mL/min (A) (by C-G formula based on SCr of 1.25 mg/dL (H)). Liver Function Tests: Recent Labs  Lab 09/07/19 0909  AST 84*  ALT 113*  ALKPHOS 77  BILITOT 0.9  PROT 7.6  ALBUMIN 3.5   No results for input(s): LIPASE, AMYLASE in the last 168 hours. No results for input(s): AMMONIA in the last 168 hours. Coagulation Profile: No results for input(s): INR, PROTIME in the last 168 hours. Cardiac Enzymes: No results for input(s): CKTOTAL, CKMB, CKMBINDEX, TROPONINI in the last 168 hours. BNP (last 3 results) No results for  input(s): PROBNP in the last 8760 hours. HbA1C: No results for input(s): HGBA1C in the last 72 hours. CBG: Recent Labs  Lab 09/11/19 0818 09/11/19 1204 09/11/19 1730 09/11/19 2120 09/12/19 0737  GLUCAP 87 73 93 94 107*   Lipid Profile: No results for input(s): CHOL, HDL, LDLCALC, TRIG, CHOLHDL, LDLDIRECT in the last 72 hours. Thyroid Function Tests: No results for input(s): TSH, T4TOTAL, FREET4, T3FREE, THYROIDAB in the last 72 hours. Anemia Panel: No results for input(s): VITAMINB12, FOLATE, FERRITIN, TIBC, IRON, RETICCTPCT in the last 72 hours. Sepsis Labs: No results for input(s): PROCALCITON, LATICACIDVEN in the last 168 hours.  No results found for this or any previous visit (from the past 240 hour(s)).       Radiology Studies: No results found.      Scheduled Meds: .  stroke: mapping our early stages of recovery book   Does not apply Once  . aspirin  325 mg Oral Daily  . enoxaparin (LOVENOX) injection  40 mg Subcutaneous Q24H  . feeding supplement (ENSURE ENLIVE)  237 mL Oral BID BM  . folic acid  1 mg Oral Daily  . insulin aspart  0-15 Units Subcutaneous TID WC  . insulin aspart  0-5 Units Subcutaneous QHS  . lactulose  20 g Oral BID  . metoprolol tartrate  25 mg Oral BID  . multivitamin with minerals  1 tablet Oral Daily  . thiamine  100 mg Oral Daily   Or  . thiamine  100 mg Intravenous Daily   Continuous Infusions:        Aline August, MD [facility] 09/12/2019, 11:50 AM

## 2019-09-13 DIAGNOSIS — G9341 Metabolic encephalopathy: Secondary | ICD-10-CM | POA: Diagnosis not present

## 2019-09-13 DIAGNOSIS — I6340 Cerebral infarction due to embolism of unspecified cerebral artery: Secondary | ICD-10-CM | POA: Diagnosis not present

## 2019-09-13 LAB — GLUCOSE, CAPILLARY
Glucose-Capillary: 79 mg/dL (ref 70–99)
Glucose-Capillary: 156 mg/dL — ABNORMAL HIGH (ref 70–99)
Glucose-Capillary: 116 mg/dL — ABNORMAL HIGH (ref 70–99)
Glucose-Capillary: 107 mg/dL — ABNORMAL HIGH (ref 70–99)

## 2019-09-13 NOTE — Progress Notes (Signed)
Occupational Therapy Note  Goals update:  Goals are still appropriate, and date extended.   Lucille Passy, OTR/L Runner, broadcasting/film/video [phone] Office [phone]

## 2019-09-13 NOTE — TOC Progression Note (Signed)
Transition of Care [facility]) - Progression Note    Patient Details  Name: Tyler [name] MRN: [id number] Date of Birth: Jan 26, 1971  Transition of Care [facility]) CM/SW Hauser, LCSW Phone Number: 09/13/2019, 11:03 AM  Clinical Narrative:    Patient remains on [facility] waitlist.    Expected Discharge Plan: [place] Barriers to Discharge: SNF Pending Medicaid, SNF Pending payor source - LOG, Active Substance Use - Placement  Expected Discharge Plan and Services Expected Discharge Plan: [place]   Discharge Planning Services: CM Consult, Other - See comment(Pt will need LOG for SNF placement) Post Acute Care Choice: [place] arrangements for the past 2 months: Single Family Home                 DME Arranged: N/A           HH Agency: NA         Social Determinants of Health (SDOH) Interventions    Readmission Risk Interventions No flowsheet data found.

## 2019-09-13 NOTE — Progress Notes (Signed)
Occupational Therapy Treatment Patient Details Name: Tyler [name] MRN: [id number] DOB: February 18, 1971 Today's Date: 09/13/2019    History of present illness 48 y/o male transferred from [place] for unresponsiveness, likely secondary to substance abuse. Pt with rhabdomyolysis and AKI. EEG revealed moderate diffuse encephalopathy. PMH includes polysubstance abuse and HTN. Imaging show acute bialteral cerebellar infarcts.    OT comments  Pt found up in chair oriented only to self. Agreeable to ambulate with no AD with MOD- MAX A for directional cues as pt with poor proprioception. Pt required MAX A to locate ADL items at sink, despite MAX multimodal cues to locate toothbrush/ paste. Pt able to state, "that's my son" when shown a picture of family this session and able to state simple facts from pictures such as"thats a dog" versus a cat when cued. DC plan remains appropriate. Will continue to follow acutely.   Follow Up Recommendations  SNF;Supervision/Assistance - 24 hour    Equipment Recommendations  Other (comment)(tbd to next venue)    Recommendations for Other Services      Precautions / Restrictions Precautions Precautions: Fall Precaution Comments: poor safety awareness Restrictions Weight Bearing Restrictions: No       Mobility Bed Mobility               General bed mobility comments: OOB in chair  Transfers Overall transfer level: Needs assistance Equipment used: None Transfers: Sit to/from Stand Sit to Stand: Supervision         General transfer comment: sup/min guard safety    Balance Overall balance assessment: Needs assistance Sitting-balance support: No upper extremity supported;Feet supported Sitting balance-Leahy Scale: Normal Sitting balance - Comments: able to sit unsupport with no LOB   Standing balance support: No upper extremity supported;During functional activity Standing balance-Leahy Scale: Fair Standing balance comment: poor due  to awareness of space and how to steady himself                           ADL either performed or assessed with clinical judgement   ADL Overall ADL's : Needs assistance/impaired     Grooming: Wash/dry face;Oral care;Maximal assistance;Cueing for safety;Cueing for sequencing;Standing Grooming Details (indicate cue type and reason): multimodal cues needed for initiation, sequencing, attention and successful completion;                 Toilet Transfer: Minimal assistance;Ambulation;Cueing for safety;Cueing for sequencing Toilet Transfer Details (indicate cue type and reason): MAX directional cues as pt unable to determine right vs. left; pt running into objects with no proprioceptive awareness         Functional mobility during ADLs: Minimal assistance;Cueing for safety;Cueing for sequencing General ADL Comments: Pt. required extensive assist with ADL secondary to cognition; unable to located needed ADL items on sink     Vision   Vision Assessment?: Vision impaired- to be further tested in functional context   Perception     Praxis      Cognition Arousal/Alertness: Awake/alert Behavior During Therapy: Impulsive Overall Cognitive Status: Impaired/Different from baseline Area of Impairment: Problem solving;Awareness;Safety/judgement;Following commands;Memory;Orientation;Attention                 Orientation Level: Disoriented to;Situation;Time;Place Current Attention Level: Selective Memory: Decreased short-term memory Following Commands: Follows one step commands inconsistently;Follows one step commands with increased time Safety/Judgement: Decreased awareness of safety;Decreased awareness of deficits Awareness: Intellectual Problem Solving: Slow processing;Requires verbal cues;Difficulty sequencing;Requires tactile cues General Comments: Oriented to self only, decreased  safety awareness, tends to laugh off confusion and at times not respond, showed pt  various photographs in room, pt able to state, "thats my son" and able to recall what is going on in picture when prompted with "are they outside or inside?"        Exercises     Shoulder Instructions       General Comments      Pertinent Vitals/ Pain       Pain Assessment: No/denies pain  Home Living                                          Prior Functioning/Environment              Frequency  Min 2X/week        Progress Toward Goals  OT Goals(current goals can now be found in the care plan section)  Progress towards OT goals: Progressing toward goals  Acute Rehab OT Goals Patient Stated Goal: not stated OT Goal Formulation: With patient Time For Goal Achievement: 09/26/19(OTR updated 10/8) Potential to Achieve Goals: Fair  Plan Discharge plan remains appropriate;Frequency remains appropriate    Co-evaluation                 AM-PAC OT "6 Clicks" Daily Activity     Outcome Measure   Help from another person eating meals?: A Little Help from another person taking care of personal grooming?: A Lot Help from another person toileting, which includes using toliet, bedpan, or urinal?: A Lot Help from another person bathing (including washing, rinsing, drying)?: A Lot Help from another person to put on and taking off regular upper body clothing?: A Lot Help from another person to put on and taking off regular lower body clothing?: A Lot 6 Click Score: 13    End of Session Equipment Utilized During Treatment: Gait belt  OT Visit Diagnosis: Other abnormalities of gait and mobility (R26.89);Other symptoms and signs involving cognitive function;Other symptoms and signs involving the nervous system (R29.898);Muscle weakness (generalized) (M62.81)   Activity Tolerance Patient tolerated treatment well   Patient Left in chair;with call bell/phone within reach;with chair alarm set   Nurse Communication          Time: [phone] OT Time  Calculation (min): 13 min  Charges: OT General Charges $OT Visit: 1 Visit OT Treatments $Self Care/Home Management : 8-22 mins  Tyler [name], Tyler [name] Acute Rehabilitation Services [phone]    Tyler [name] 09/13/2019, 4:43 PM

## 2019-09-13 NOTE — Progress Notes (Signed)
Patient ID: Tyler [name], male   DOB: 10-12-1971, 48 y.o.   MRN: [id number]  PROGRESS NOTE    Marlan Steward  BWL:[id number] DOB: 07/11/1971 DOA: 07/23/2019 PCP: Patient, No Pcp Per   Brief Narrative:  48 year old male with history of polysubstance abuse including heroine presented to [facility] ER after being found down with a syringe nearby.  He was given Narcan with minimal improvement.  On presentation, he was found to have severe rhabdomyolysis with acute kidney injury and hyperkalemia.  He was started on IV fluids including bicarb drip and transferred to [facility] for further care.  Rhabdomyolysis improved but he had persistence of encephalopathy and was found to have acute bilateral cerebellar infarct.  Neurology was consulted and has subsequently signed off.  PT recommended SNF placement.  Prolonged hospitalization due to placement issues.  Patient is medically stable for discharge to a skilled nursing facility as soon as bed is available.  Assessment & Plan:  Acute bilateral CVA, nonhemorrhagic -MRI of the brain showed acute CVA -Neurology evaluated the patient and has signed off.  MRA of the head negative. -Ultrasound carotid showed bilateral 1 to 39% ICA stenosis -LDL 83 -A1c 5.1 -Echo showed EF of 60 to 65%.  Lower extremity Dopplers: Negative for DVT -Neurology recommended considering 30-day cardiac event monitoring as outpatient to rule out A. fib if patient neurologically improves -Continue statin.  Not on statin due to elevated LFTs -PT recommended SNF.  Awaiting SNF placement.  Acute metabolic encephalopathy -Multifactorial: Most likely secondary CVA, drug use, uremia, heavy alcohol use -CT of the head and neck was initially negative for any traumatic finding -UDS, Tylenol and salicylate levels were negative. -TSH, folate, B12 levels all within normal limits -EEG: Generalized diffuse slowing but no seizure activity -Had required intermittent restraints initially during the  hospitalization. -Fall precautions - Awaiting SNF placement.Difficult placement  -Waxing and waning mental status.  Still not fully oriented  Severe rhabdomyolysis/AKI -Resolved with IV fluids.  Renal ultrasound negative.  Off IV fluids  Transaminitis with elevated bilirubin -Probably from alcohol abuse -Right upper quadrant ultrasound showed hepatic steatosis with early cirrhosis with some gallbladder thickening -Monitor intermittently  Alcohol abuse -Advised to quit.  Continue thiamine, multivitamin and folic acid  UTI -Present on admission.  Completed 5 days of Rocephin  Essential hypertension -Stable.  Continue metoprolol.  Severe malnutrition -Follow nutrition recommendations    DVT prophylaxis: Lovenox Code Status: Full Family Communication: None at bedside Disposition Plan: SNF once bed is available  Consultants: Neurology  Procedures:  Echo IMPRESSIONS   1. The left ventricle has normal systolic function with an ejection fraction of 60-65%. The cavity size was normal. Left ventricular diastolic Doppler parameters are consistent with impaired relaxation. No evidence of left ventricular regional wall  motion abnormalities. 2. The right ventricle has normal systolic function. The cavity was normal. There is no increase in right ventricular wall thickness. Right ventricular systolic pressure is normal with an estimated pressure of 21.0 mmHg. 3. The aortic valve is tricuspid. Moderate sclerosis of the aortic valve. 4. The aorta is normal unless otherwise noted.  Antimicrobials:  Anti-infectives (From admission, onward)   Start     Dose/Rate Route Frequency Ordered Stop   07/25/19 0745  cefTRIAXone (ROCEPHIN) 1 g in sodium chloride 0.9 % 100 mL IVPB     1 g 200 mL/hr over 30 Minutes Intravenous Every 24 hours 07/25/19 0734 07/29/19 1518       Subjective: Patient seen and examined at bedside.  Very  poor historian.  No overnight fever, vomiting reported  by nursing staff.  More awake this morning, does not try to answer any questions. Objective: Vitals:   09/12/19 0530 09/12/19 0854 09/12/19 1458 09/12/19 2127  BP: (!) 104/59 (!) 130/99 106/74 109/77  Pulse: 66  77 81  Resp: 16  18 18   Temp: (!) 97.5 F (36.4 C)  (!) 97.5 F (36.4 C) 98 F (36.7 C)  TempSrc: Oral  Oral Oral  SpO2: 100%  100% 99%  Weight:      Height:        Intake/Output Summary (Last 24 hours) at 09/13/2019 0753 Last data filed at 09/12/2019 1800 Gross per 24 hour  Intake 610 ml  Output 200 ml  Net 410 ml   Filed Weights   09/09/19 0615 09/10/19 0623 09/11/19 0605  Weight: 65.1 kg 64 kg 64.5 kg    Examination: General exam:  No acute distress.   More awake this morning, does not try to answer questions.  Respiratory system: Bilateral decreased breath sounds at bases, no wheezing  cardiovascular system: S1-S2 heard, rate controlled gastrointestinal system:Abdomen is nondistended, soft and nontender. Normal bowel sounds heard. Extremities: No cyanosis, edema    Data Reviewed: I have personally reviewed following labs and imaging studies  CBC: Recent Labs  Lab 09/11/19 0228  WBC 7.6  HGB 12.8*  HCT 38.5*  MCV 97.0  PLT 226   Basic Metabolic Panel: Recent Labs  Lab 09/07/19 0909 09/11/19 0228  NA 142 142  K 4.3 4.4  CL 106 105  CO2 25 25  GLUCOSE 125* 93  BUN 23* 26*  CREATININE 1.23 1.25*  CALCIUM 9.1 9.4   GFR: Estimated Creatinine Clearance: 64.5 mL/min (A) (by C-G formula based on SCr of 1.25 mg/dL (H)). Liver Function Tests: Recent Labs  Lab 09/07/19 0909  AST 84*  ALT 113*  ALKPHOS 77  BILITOT 0.9  PROT 7.6  ALBUMIN 3.5   No results for input(s): LIPASE, AMYLASE in the last 168 hours. No results for input(s): AMMONIA in the last 168 hours. Coagulation Profile: No results for input(s): INR, PROTIME in the last 168 hours. Cardiac Enzymes: No results for input(s): CKTOTAL, CKMB, CKMBINDEX, TROPONINI in the last 168  hours. BNP (last 3 results) No results for input(s): PROBNP in the last 8760 hours. HbA1C: No results for input(s): HGBA1C in the last 72 hours. CBG: Recent Labs  Lab 09/11/19 2120 09/12/19 0737 09/12/19 1212 09/12/19 1655 09/12/19 2311  GLUCAP 94 107* 145* 165* 122*   Lipid Profile: No results for input(s): CHOL, HDL, LDLCALC, TRIG, CHOLHDL, LDLDIRECT in the last 72 hours. Thyroid Function Tests: No results for input(s): TSH, T4TOTAL, FREET4, T3FREE, THYROIDAB in the last 72 hours. Anemia Panel: No results for input(s): VITAMINB12, FOLATE, FERRITIN, TIBC, IRON, RETICCTPCT in the last 72 hours. Sepsis Labs: No results for input(s): PROCALCITON, LATICACIDVEN in the last 168 hours.  No results found for this or any previous visit (from the past 240 hour(s)).       Radiology Studies: No results found.      Scheduled Meds: .  stroke: mapping our early stages of recovery book   Does not apply Once  . aspirin  325 mg Oral Daily  . enoxaparin (LOVENOX) injection  40 mg Subcutaneous Q24H  . feeding supplement (ENSURE ENLIVE)  237 mL Oral BID BM  . folic acid  1 mg Oral Daily  . insulin aspart  0-15 Units Subcutaneous TID WC  . insulin aspart  0-5 Units Subcutaneous QHS  . lactulose  20 g Oral BID  . metoprolol tartrate  25 mg Oral BID  . multivitamin with minerals  1 tablet Oral Daily  . thiamine  100 mg Oral Daily   Or  . thiamine  100 mg Intravenous Daily   Continuous Infusions:        Glade LloydKshitiz Areeb Corron, MD [facility] 09/13/2019, 7:53 AM

## 2019-09-14 DIAGNOSIS — G9341 Metabolic encephalopathy: Secondary | ICD-10-CM | POA: Diagnosis not present

## 2019-09-14 DIAGNOSIS — I6340 Cerebral infarction due to embolism of unspecified cerebral artery: Secondary | ICD-10-CM | POA: Diagnosis not present

## 2019-09-14 LAB — GLUCOSE, CAPILLARY
Glucose-Capillary: 101 mg/dL — ABNORMAL HIGH (ref 70–99)
Glucose-Capillary: 199 mg/dL — ABNORMAL HIGH (ref 70–99)
Glucose-Capillary: 122 mg/dL — ABNORMAL HIGH (ref 70–99)
Glucose-Capillary: 159 mg/dL — ABNORMAL HIGH (ref 70–99)

## 2019-09-14 NOTE — Plan of Care (Signed)
  Problem: Nutrition: Goal: Risk of aspiration will decrease Outcome: Progressing Goal: Dietary intake will improve Outcome: Progressing   Problem: Health Behavior/Discharge Planning: Goal: Ability to manage health-related needs will improve Outcome: Not Progressing

## 2019-09-14 NOTE — Progress Notes (Signed)
Patient ID: Tyler [name], male   DOB: 17-Oct-1971, 48 y.o.   MRN: [id number]  PROGRESS NOTE    Tyler [name]  LZJ:[id number] DOB: 08/09/1971 DOA: 07/23/2019 PCP: Patient, No Pcp Per   Brief Narrative:  48 year old male with history of polysubstance abuse including heroine presented to [facility] ER after being found down with a syringe nearby.  He was given Narcan with minimal improvement.  On presentation, he was found to have severe rhabdomyolysis with acute kidney injury and hyperkalemia.  He was started on IV fluids including bicarb drip and transferred to [facility] for further care.  Rhabdomyolysis improved but he had persistence of encephalopathy and was found to have acute bilateral cerebellar infarct.  Neurology was consulted and has subsequently signed off.  PT recommended SNF placement.  Prolonged hospitalization due to placement issues.  Patient is medically stable for discharge to a skilled nursing facility as soon as bed is available.  Assessment & Plan:  Acute bilateral CVA, nonhemorrhagic -MRI of the brain showed acute CVA -Neurology evaluated the patient and has signed off.  MRA of the head negative. -Ultrasound carotid showed bilateral 1 to 39% ICA stenosis -LDL 83 -A1c 5.1 -Echo showed EF of 60 to 65%.  Lower extremity Dopplers: Negative for DVT -Neurology recommended considering 30-day cardiac event monitoring as outpatient to rule out A. fib if patient neurologically improves -Continue statin.  Not on statin due to elevated LFTs -PT recommended SNF.  Awaiting SNF placement.  Acute metabolic encephalopathy -Multifactorial: Most likely secondary CVA, drug use, uremia, heavy alcohol use -CT of the head and neck was initially negative for any traumatic finding -UDS, Tylenol and salicylate levels were negative. -TSH, folate, B12 levels all within normal limits -EEG: Generalized diffuse slowing but no seizure activity -Had required intermittent restraints initially during the  hospitalization. -Fall precautions - Awaiting SNF placement.Difficult placement  -Waxing and waning mental status.  Still not fully oriented  Severe rhabdomyolysis/AKI -Resolved with IV fluids.  Renal ultrasound negative.  Off IV fluids  Transaminitis with elevated bilirubin -Probably from alcohol abuse -Right upper quadrant ultrasound showed hepatic steatosis with early cirrhosis with some gallbladder thickening -Monitor intermittently  Alcohol abuse -Advised to quit.  Continue thiamine, multivitamin and folic acid  UTI -Present on admission.  Completed 5 days of Rocephin  Essential hypertension -Stable.  Continue metoprolol.  Severe malnutrition -Follow nutrition recommendations    DVT prophylaxis: Lovenox Code Status: Full Family Communication: None at bedside Disposition Plan: SNF once bed is available  Consultants: Neurology  Procedures:  Echo IMPRESSIONS   1. The left ventricle has normal systolic function with an ejection fraction of 60-65%. The cavity size was normal. Left ventricular diastolic Doppler parameters are consistent with impaired relaxation. No evidence of left ventricular regional wall  motion abnormalities. 2. The right ventricle has normal systolic function. The cavity was normal. There is no increase in right ventricular wall thickness. Right ventricular systolic pressure is normal with an estimated pressure of 21.0 mmHg. 3. The aortic valve is tricuspid. Moderate sclerosis of the aortic valve. 4. The aorta is normal unless otherwise noted.  Antimicrobials:  Anti-infectives (From admission, onward)   Start     Dose/Rate Route Frequency Ordered Stop   07/25/19 0745  cefTRIAXone (ROCEPHIN) 1 g in sodium chloride 0.9 % 100 mL IVPB     1 g 200 mL/hr over 30 Minutes Intravenous Every 24 hours 07/25/19 0734 07/29/19 1518       Subjective: Patient seen and examined at bedside.  Very  poor historian.  No fever or vomiting reported by  nursing staff.   Objective: Vitals:   09/13/19 2111 09/13/19 2112 09/14/19 0305 09/14/19 0458  BP: 119/85 119/83  127/89  Pulse: 61 (!) 56  67  Resp:  16  16  Temp:  98.3 F (36.8 C)  98 F (36.7 C)  TempSrc:  Oral    SpO2:  100%  100%  Weight:   65 kg   Height:        Intake/Output Summary (Last 24 hours) at 09/14/2019 0750 Last data filed at 09/13/2019 2245 Gross per 24 hour  Intake 880 ml  Output -  Net 880 ml   Filed Weights   09/10/19 0623 09/11/19 0605 09/14/19 0305  Weight: 64 kg 64.5 kg 65 kg    Examination: General exam:  No distress.   Awake, hardly participates in any conversation. Respiratory system: Bilateral decreased breath sounds at bases  cardiovascular system:Intermittent bradycardia, S1-S2 heard gastrointestinal system:Abdomen is nondistended, soft and nontender. Normal bowel sounds heard. Extremities: No cyanosis, edema    Data Reviewed: I have personally reviewed following labs and imaging studies  CBC: Recent Labs  Lab 09/11/19 0228  WBC 7.6  HGB 12.8*  HCT 38.5*  MCV 97.0  PLT 629   Basic Metabolic Panel: Recent Labs  Lab 09/07/19 0909 09/11/19 0228  NA 142 142  K 4.3 4.4  CL 106 105  CO2 25 25  GLUCOSE 125* 93  BUN 23* 26*  CREATININE 1.23 1.25*  CALCIUM 9.1 9.4   GFR: Estimated Creatinine Clearance: 65 mL/min (A) (by C-G formula based on SCr of 1.25 mg/dL (H)). Liver Function Tests: Recent Labs  Lab 09/07/19 0909  AST 84*  ALT 113*  ALKPHOS 77  BILITOT 0.9  PROT 7.6  ALBUMIN 3.5   No results for input(s): LIPASE, AMYLASE in the last 168 hours. No results for input(s): AMMONIA in the last 168 hours. Coagulation Profile: No results for input(s): INR, PROTIME in the last 168 hours. Cardiac Enzymes: No results for input(s): CKTOTAL, CKMB, CKMBINDEX, TROPONINI in the last 168 hours. BNP (last 3 results) No results for input(s): PROBNP in the last 8760 hours. HbA1C: No results for input(s): HGBA1C in the last 72  hours. CBG: Recent Labs  Lab 09/12/19 2311 09/13/19 0842 09/13/19 1203 09/13/19 1657 09/13/19 2110  GLUCAP 122* 79 156* 116* 107*   Lipid Profile: No results for input(s): CHOL, HDL, LDLCALC, TRIG, CHOLHDL, LDLDIRECT in the last 72 hours. Thyroid Function Tests: No results for input(s): TSH, T4TOTAL, FREET4, T3FREE, THYROIDAB in the last 72 hours. Anemia Panel: No results for input(s): VITAMINB12, FOLATE, FERRITIN, TIBC, IRON, RETICCTPCT in the last 72 hours. Sepsis Labs: No results for input(s): PROCALCITON, LATICACIDVEN in the last 168 hours.  No results found for this or any previous visit (from the past 240 hour(s)).       Radiology Studies: No results found.      Scheduled Meds: .  stroke: mapping our early stages of recovery book   Does not apply Once  . aspirin  325 mg Oral Daily  . enoxaparin (LOVENOX) injection  40 mg Subcutaneous Q24H  . feeding supplement (ENSURE ENLIVE)  237 mL Oral BID BM  . folic acid  1 mg Oral Daily  . insulin aspart  0-15 Units Subcutaneous TID WC  . insulin aspart  0-5 Units Subcutaneous QHS  . lactulose  20 g Oral BID  . metoprolol tartrate  25 mg Oral BID  . multivitamin  with minerals  1 tablet Oral Daily  . thiamine  100 mg Oral Daily   Or  . thiamine  100 mg Intravenous Daily   Continuous Infusions:        Glade Lloyd, MD [facility] 09/14/2019, 7:50 AM

## 2019-09-15 DIAGNOSIS — I6340 Cerebral infarction due to embolism of unspecified cerebral artery: Secondary | ICD-10-CM | POA: Diagnosis not present

## 2019-09-15 DIAGNOSIS — G9341 Metabolic encephalopathy: Secondary | ICD-10-CM | POA: Diagnosis not present

## 2019-09-15 LAB — GLUCOSE, CAPILLARY
Glucose-Capillary: 85 mg/dL (ref 70–99)
Glucose-Capillary: 138 mg/dL — ABNORMAL HIGH (ref 70–99)
Glucose-Capillary: 126 mg/dL — ABNORMAL HIGH (ref 70–99)
Glucose-Capillary: 95 mg/dL (ref 70–99)

## 2019-09-15 NOTE — Progress Notes (Signed)
Patient ID: Kojo Liby, male   DOB: 17-Oct-1971, 48 y.o.   MRN: [id number]  PROGRESS NOTE    Cordarryl Monrreal  LZJ:[id number] DOB: 08/09/1971 DOA: 07/23/2019 PCP: Patient, No Pcp Per   Brief Narrative:  48 year old male with history of polysubstance abuse including heroine presented to [facility] ER after being found down with a syringe nearby.  He was given Narcan with minimal improvement.  On presentation, he was found to have severe rhabdomyolysis with acute kidney injury and hyperkalemia.  He was started on IV fluids including bicarb drip and transferred to [facility] for further care.  Rhabdomyolysis improved but he had persistence of encephalopathy and was found to have acute bilateral cerebellar infarct.  Neurology was consulted and has subsequently signed off.  PT recommended SNF placement.  Prolonged hospitalization due to placement issues.  Patient is medically stable for discharge to a skilled nursing facility as soon as bed is available.  Assessment & Plan:  Acute bilateral CVA, nonhemorrhagic -MRI of the brain showed acute CVA -Neurology evaluated the patient and has signed off.  MRA of the head negative. -Ultrasound carotid showed bilateral 1 to 39% ICA stenosis -LDL 83 -A1c 5.1 -Echo showed EF of 60 to 65%.  Lower extremity Dopplers: Negative for DVT -Neurology recommended considering 30-day cardiac event monitoring as outpatient to rule out A. fib if patient neurologically improves -Continue statin.  Not on statin due to elevated LFTs -PT recommended SNF.  Awaiting SNF placement.  Acute metabolic encephalopathy -Multifactorial: Most likely secondary CVA, drug use, uremia, heavy alcohol use -CT of the head and neck was initially negative for any traumatic finding -UDS, Tylenol and salicylate levels were negative. -TSH, folate, B12 levels all within normal limits -EEG: Generalized diffuse slowing but no seizure activity -Had required intermittent restraints initially during the  hospitalization. -Fall precautions - Awaiting SNF placement.Difficult placement  -Waxing and waning mental status.  Still not fully oriented  Severe rhabdomyolysis/AKI -Resolved with IV fluids.  Renal ultrasound negative.  Off IV fluids  Transaminitis with elevated bilirubin -Probably from alcohol abuse -Right upper quadrant ultrasound showed hepatic steatosis with early cirrhosis with some gallbladder thickening -Monitor intermittently  Alcohol abuse -Advised to quit.  Continue thiamine, multivitamin and folic acid  UTI -Present on admission.  Completed 5 days of Rocephin  Essential hypertension -Stable.  Continue metoprolol.  Severe malnutrition -Follow nutrition recommendations    DVT prophylaxis: Lovenox Code Status: Full Family Communication: None at bedside Disposition Plan: SNF once bed is available  Consultants: Neurology  Procedures:  Echo IMPRESSIONS   1. The left ventricle has normal systolic function with an ejection fraction of 60-65%. The cavity size was normal. Left ventricular diastolic Doppler parameters are consistent with impaired relaxation. No evidence of left ventricular regional wall  motion abnormalities. 2. The right ventricle has normal systolic function. The cavity was normal. There is no increase in right ventricular wall thickness. Right ventricular systolic pressure is normal with an estimated pressure of 21.0 mmHg. 3. The aortic valve is tricuspid. Moderate sclerosis of the aortic valve. 4. The aorta is normal unless otherwise noted.  Antimicrobials:  Anti-infectives (From admission, onward)   Start     Dose/Rate Route Frequency Ordered Stop   07/25/19 0745  cefTRIAXone (ROCEPHIN) 1 g in sodium chloride 0.9 % 100 mL IVPB     1 g 200 mL/hr over 30 Minutes Intravenous Every 24 hours 07/25/19 0734 07/29/19 1518       Subjective: Patient seen and examined at bedside.  Very  poor historian.  No overnight fever, seizure activities  reported by nursing staff.   Objective: Vitals:   09/14/19 0956 09/14/19 1304 09/14/19 2114 09/15/19 0618  BP: (!) 146/92 135/87 (!) 127/97 (!) 139/95  Pulse: 75 69 75 82  Resp:  18 17 18   Temp:  98.3 F (36.8 C) 98.1 F (36.7 C) 97.8 F (36.6 C)  TempSrc:  Oral Oral Oral  SpO2:  100% 100% 100%  Weight:      Height:        Intake/Output Summary (Last 24 hours) at 09/15/2019 0755 Last data filed at 09/14/2019 1900 Gross per 24 hour  Intake 1200 ml  Output -  Net 1200 ml   Filed Weights   09/10/19 0623 09/11/19 0605 09/14/19 0305  Weight: 64 kg 64.5 kg 65 kg    Examination: General exam:  No acute distress.  Sleepy, wakes up slightly, hardly answers any questions. Respiratory system: Bilateral decreased breath sounds at bases  cardiovascular system:Intermittent bradycardia, S1-S2 heard gastrointestinal system:Abdomen is nondistended, soft and nontender. Normal bowel sounds heard. Extremities: No cyanosis, edema    Data Reviewed: I have personally reviewed following labs and imaging studies  CBC: Recent Labs  Lab 09/11/19 0228  WBC 7.6  HGB 12.8*  HCT 38.5*  MCV 97.0  PLT 226   Basic Metabolic Panel: Recent Labs  Lab 09/11/19 0228  NA 142  K 4.4  CL 105  CO2 25  GLUCOSE 93  BUN 26*  CREATININE 1.25*  CALCIUM 9.4   GFR: Estimated Creatinine Clearance: 65 mL/min (A) (by C-G formula based on SCr of 1.25 mg/dL (H)). Liver Function Tests: No results for input(s): AST, ALT, ALKPHOS, BILITOT, PROT, ALBUMIN in the last 168 hours. No results for input(s): LIPASE, AMYLASE in the last 168 hours. No results for input(s): AMMONIA in the last 168 hours. Coagulation Profile: No results for input(s): INR, PROTIME in the last 168 hours. Cardiac Enzymes: No results for input(s): CKTOTAL, CKMB, CKMBINDEX, TROPONINI in the last 168 hours. BNP (last 3 results) No results for input(s): PROBNP in the last 8760 hours. HbA1C: No results for input(s): HGBA1C in the  last 72 hours. CBG: Recent Labs  Lab 09/13/19 2110 09/14/19 0842 09/14/19 1156 09/14/19 1607 09/14/19 2114  GLUCAP 107* 101* 199* 122* 159*   Lipid Profile: No results for input(s): CHOL, HDL, LDLCALC, TRIG, CHOLHDL, LDLDIRECT in the last 72 hours. Thyroid Function Tests: No results for input(s): TSH, T4TOTAL, FREET4, T3FREE, THYROIDAB in the last 72 hours. Anemia Panel: No results for input(s): VITAMINB12, FOLATE, FERRITIN, TIBC, IRON, RETICCTPCT in the last 72 hours. Sepsis Labs: No results for input(s): PROCALCITON, LATICACIDVEN in the last 168 hours.  No results found for this or any previous visit (from the past 240 hour(s)).       Radiology Studies: No results found.      Scheduled Meds: .  stroke: mapping our early stages of recovery book   Does not apply Once  . aspirin  325 mg Oral Daily  . enoxaparin (LOVENOX) injection  40 mg Subcutaneous Q24H  . feeding supplement (ENSURE ENLIVE)  237 mL Oral BID BM  . folic acid  1 mg Oral Daily  . insulin aspart  0-15 Units Subcutaneous TID WC  . insulin aspart  0-5 Units Subcutaneous QHS  . lactulose  20 g Oral BID  . metoprolol tartrate  25 mg Oral BID  . multivitamin with minerals  1 tablet Oral Daily  . thiamine  100 mg Oral  Daily   Or  . thiamine  100 mg Intravenous Daily   Continuous Infusions:        Aline August, MD [facility] 09/15/2019, 7:55 AM

## 2019-09-15 NOTE — Plan of Care (Signed)
  Problem: Clinical Measurements: Goal: Ability to maintain clinical measurements within normal limits will improve Outcome: Progressing   Problem: Activity: Goal: Risk for activity intolerance will decrease Outcome: Progressing   Problem: Nutrition: Goal: Adequate nutrition will be maintained Outcome: Progressing   Problem: Clinical Measurements: Goal: Ability to maintain clinical measurements within normal limits will improve Outcome: Progressing Goal: Will remain free from infection Outcome: Progressing Goal: Diagnostic test results will improve Outcome: Progressing   Problem: Activity: Goal: Risk for activity intolerance will decrease Outcome: Progressing   Problem: Nutrition: Goal: Adequate nutrition will be maintained Outcome: Progressing   Problem: Health Behavior/Discharge Planning: Goal: Ability to manage health-related needs will improve Outcome: Progressing   Problem: Nutrition: Goal: Risk of aspiration will decrease Outcome: Progressing Goal: Dietary intake will improve Outcome: Progressing   Problem: Ischemic Stroke/TIA Tissue Perfusion: Goal: Complications of ischemic stroke/TIA will be minimized Outcome: Progressing

## 2019-09-15 NOTE — Progress Notes (Signed)
   09/15/19 2245  MEWS Score  Resp 16  Pulse Rate 86  BP (!) 113/92  Temp 98.3 F (36.8 C)  SpO2 98 %  O2 Device Room Air  MEWS Score  MEWS RR 0  MEWS Pulse 0  MEWS Systolic 0  MEWS LOC 0  MEWS Temp 0  MEWS Score 0  MEWS Score Color Green  MEWS Assessment  Is this an acute change? No  MEWS Guidelines - (patients age 50 and over)  Red - At High Risk for Deterioration Yellow - At risk for Deterioration  1. Go to room and assess patient 2. Validate data. Is this patient's baseline? If data confirmed: 3. Is this an acute change? 4. Administer prn meds/treatments as ordered. 5. Note Sepsis score 6. Review goals of care 7. Sports coach, RRT nurse and Provider. 8. Ask Provider to come to bedside.  9. Document patient condition/interventions/response. 10. Increase frequency of vital signs and focused assessments to at least q15 minutes x 4, then q30 minutes x2. - If stable, then q1h x3, then q4h x3 and then q8h or dept. routine. - If unstable, contact Provider & RRT nurse. Prepare for possible transfer. 11. Add entry in progress notes using the smart phrase ".MEWS". 1. Go to room and assess patient 2. Validate data. Is this patient's baseline? If data confirmed: 3. Is this an acute change? 4. Administer prn meds/treatments as ordered? 5. Note Sepsis score 6. Review goals of care 7. Sports coach and Provider 8. Call RRT nurse as needed. 9. Document patient condition/interventions/response. 10. Increase frequency of vital signs and focused assessments to at least q2h x2. - If stable, then q4h x2 and then q8h or dept. routine. - If unstable, contact Provider & RRT nurse. Prepare for possible transfer. 11. Add entry in progress notes using the smart phrase ".MEWS".  Green - Likely stable Lavender - Comfort Care Only  1. Continue routine/ordered monitoring.  2. Review goals of care. 1. Continue routine/ordered monitoring. 2. Review goals of care.

## 2019-09-16 DIAGNOSIS — G9341 Metabolic encephalopathy: Secondary | ICD-10-CM | POA: Diagnosis not present

## 2019-09-16 DIAGNOSIS — I6340 Cerebral infarction due to embolism of unspecified cerebral artery: Secondary | ICD-10-CM | POA: Diagnosis not present

## 2019-09-16 LAB — GLUCOSE, CAPILLARY
Glucose-Capillary: 91 mg/dL (ref 70–99)
Glucose-Capillary: 93 mg/dL (ref 70–99)
Glucose-Capillary: 103 mg/dL — ABNORMAL HIGH (ref 70–99)
Glucose-Capillary: 125 mg/dL — ABNORMAL HIGH (ref 70–99)

## 2019-09-16 NOTE — Plan of Care (Signed)
  Problem: Clinical Measurements: Goal: Ability to maintain clinical measurements within normal limits will improve Outcome: Progressing   Problem: Activity: Goal: Risk for activity intolerance will decrease Outcome: Progressing   Problem: Nutrition: Goal: Adequate nutrition will be maintained Outcome: Progressing   Problem: Clinical Measurements: Goal: Ability to maintain clinical measurements within normal limits will improve Outcome: Progressing Goal: Will remain free from infection Outcome: Progressing Goal: Diagnostic test results will improve Outcome: Progressing   Problem: Activity: Goal: Risk for activity intolerance will decrease Outcome: Progressing   Problem: Nutrition: Goal: Adequate nutrition will be maintained Outcome: Progressing   Problem: Health Behavior/Discharge Planning: Goal: Ability to manage health-related needs will improve Outcome: Progressing   Problem: Nutrition: Goal: Risk of aspiration will decrease Outcome: Progressing Goal: Dietary intake will improve Outcome: Progressing   Problem: Ischemic Stroke/TIA Tissue Perfusion: Goal: Complications of ischemic stroke/TIA will be minimized Outcome: Progressing   

## 2019-09-16 NOTE — Progress Notes (Signed)
Patient ID: Tyler [name], male   DOB: 17-Oct-1971, 48 y.o.   MRN: [id number]  PROGRESS NOTE    Cordarryl Monrreal  LZJ:[id number] DOB: 08/09/1971 DOA: 07/23/2019 PCP: Patient, No Pcp Per   Brief Narrative:  48 year old male with history of polysubstance abuse including heroine presented to [facility] ER after being found down with a syringe nearby.  He was given Narcan with minimal improvement.  On presentation, he was found to have severe rhabdomyolysis with acute kidney injury and hyperkalemia.  He was started on IV fluids including bicarb drip and transferred to [facility] for further care.  Rhabdomyolysis improved but he had persistence of encephalopathy and was found to have acute bilateral cerebellar infarct.  Neurology was consulted and has subsequently signed off.  PT recommended SNF placement.  Prolonged hospitalization due to placement issues.  Patient is medically stable for discharge to a skilled nursing facility as soon as bed is available.  Assessment & Plan:  Acute bilateral CVA, nonhemorrhagic -MRI of the brain showed acute CVA -Neurology evaluated the patient and has signed off.  MRA of the head negative. -Ultrasound carotid showed bilateral 1 to 39% ICA stenosis -LDL 83 -A1c 5.1 -Echo showed EF of 60 to 65%.  Lower extremity Dopplers: Negative for DVT -Neurology recommended considering 30-day cardiac event monitoring as outpatient to rule out A. fib if patient neurologically improves -Continue statin.  Not on statin due to elevated LFTs -PT recommended SNF.  Awaiting SNF placement.  Acute metabolic encephalopathy -Multifactorial: Most likely secondary CVA, drug use, uremia, heavy alcohol use -CT of the head and neck was initially negative for any traumatic finding -UDS, Tylenol and salicylate levels were negative. -TSH, folate, B12 levels all within normal limits -EEG: Generalized diffuse slowing but no seizure activity -Had required intermittent restraints initially during the  hospitalization. -Fall precautions - Awaiting SNF placement.Difficult placement  -Waxing and waning mental status.  Still not fully oriented  Severe rhabdomyolysis/AKI -Resolved with IV fluids.  Renal ultrasound negative.  Off IV fluids  Transaminitis with elevated bilirubin -Probably from alcohol abuse -Right upper quadrant ultrasound showed hepatic steatosis with early cirrhosis with some gallbladder thickening -Monitor intermittently  Alcohol abuse -Advised to quit.  Continue thiamine, multivitamin and folic acid  UTI -Present on admission.  Completed 5 days of Rocephin  Essential hypertension -Stable.  Continue metoprolol.  Severe malnutrition -Follow nutrition recommendations    DVT prophylaxis: Lovenox Code Status: Full Family Communication: None at bedside Disposition Plan: SNF once bed is available  Consultants: Neurology  Procedures:  Echo IMPRESSIONS   1. The left ventricle has normal systolic function with an ejection fraction of 60-65%. The cavity size was normal. Left ventricular diastolic Doppler parameters are consistent with impaired relaxation. No evidence of left ventricular regional wall  motion abnormalities. 2. The right ventricle has normal systolic function. The cavity was normal. There is no increase in right ventricular wall thickness. Right ventricular systolic pressure is normal with an estimated pressure of 21.0 mmHg. 3. The aortic valve is tricuspid. Moderate sclerosis of the aortic valve. 4. The aorta is normal unless otherwise noted.  Antimicrobials:  Anti-infectives (From admission, onward)   Start     Dose/Rate Route Frequency Ordered Stop   07/25/19 0745  cefTRIAXone (ROCEPHIN) 1 g in sodium chloride 0.9 % 100 mL IVPB     1 g 200 mL/hr over 30 Minutes Intravenous Every 24 hours 07/25/19 0734 07/29/19 1518       Subjective: Patient seen and examined at bedside.  Very  poor historian.  No new complaints.  No seizure-like  activity reported per nursing staff.   Objective: Vitals:   09/15/19 1500 09/15/19 2245 09/16/19 0517 09/16/19 0518  BP: (!) 116/98 (!) 113/92  102/68  Pulse: 80 86  (!) 43  Resp: 19 16  16   Temp: 97.7 F (36.5 C) 98.3 F (36.8 C)  97.7 F (36.5 C)  TempSrc: Oral Oral  Oral  SpO2: 100% 98%  99%  Weight:   67.7 kg   Height:        Intake/Output Summary (Last 24 hours) at 09/16/2019 0750 Last data filed at 09/15/2019 1729 Gross per 24 hour  Intake 834 ml  Output -  Net 834 ml   Filed Weights   09/11/19 0605 09/14/19 0305 09/16/19 0517  Weight: 64.5 kg 65 kg 67.7 kg    Examination: General exam:No distress.  Hardly participates in any conversation.  Respiratory system: Bilateral decreased breath sounds at bases  cardiovascular system:Intermittent bradycardia, S1-S2 heard gastrointestinal system:Abdomen is nondistended, soft and nontender. Normal bowel sounds heard. Extremities: No cyanosis, edema    Data Reviewed: I have personally reviewed following labs and imaging studies  CBC: Recent Labs  Lab 09/11/19 0228  WBC 7.6  HGB 12.8*  HCT 38.5*  MCV 97.0  PLT 226   Basic Metabolic Panel: Recent Labs  Lab 09/11/19 0228  NA 142  K 4.4  CL 105  CO2 25  GLUCOSE 93  BUN 26*  CREATININE 1.25*  CALCIUM 9.4   GFR: Estimated Creatinine Clearance: 67.7 mL/min (A) (by C-G formula based on SCr of 1.25 mg/dL (H)). Liver Function Tests: No results for input(s): AST, ALT, ALKPHOS, BILITOT, PROT, ALBUMIN in the last 168 hours. No results for input(s): LIPASE, AMYLASE in the last 168 hours. No results for input(s): AMMONIA in the last 168 hours. Coagulation Profile: No results for input(s): INR, PROTIME in the last 168 hours. Cardiac Enzymes: No results for input(s): CKTOTAL, CKMB, CKMBINDEX, TROPONINI in the last 168 hours. BNP (last 3 results) No results for input(s): PROBNP in the last 8760 hours. HbA1C: No results for input(s): HGBA1C in the last 72 hours.  CBG: Recent Labs  Lab 09/14/19 2114 09/15/19 0809 09/15/19 1152 09/15/19 1651 09/15/19 2228  GLUCAP 159* 85 138* 126* 95   Lipid Profile: No results for input(s): CHOL, HDL, LDLCALC, TRIG, CHOLHDL, LDLDIRECT in the last 72 hours. Thyroid Function Tests: No results for input(s): TSH, T4TOTAL, FREET4, T3FREE, THYROIDAB in the last 72 hours. Anemia Panel: No results for input(s): VITAMINB12, FOLATE, FERRITIN, TIBC, IRON, RETICCTPCT in the last 72 hours. Sepsis Labs: No results for input(s): PROCALCITON, LATICACIDVEN in the last 168 hours.  No results found for this or any previous visit (from the past 240 hour(s)).       Radiology Studies: No results found.      Scheduled Meds: .  stroke: mapping our early stages of recovery book   Does not apply Once  . aspirin  325 mg Oral Daily  . enoxaparin (LOVENOX) injection  40 mg Subcutaneous Q24H  . feeding supplement (ENSURE ENLIVE)  237 mL Oral BID BM  . folic acid  1 mg Oral Daily  . insulin aspart  0-15 Units Subcutaneous TID WC  . insulin aspart  0-5 Units Subcutaneous QHS  . lactulose  20 g Oral BID  . metoprolol tartrate  25 mg Oral BID  . multivitamin with minerals  1 tablet Oral Daily  . thiamine  100 mg Oral Daily  Or  . thiamine  100 mg Intravenous Daily   Continuous Infusions:        Glade LloydKshitiz Lanaya Bennis, MD [facility] 09/16/2019, 7:50 AM

## 2019-09-16 NOTE — Progress Notes (Signed)
   09/16/19 2109  MEWS Score  Resp 16  Pulse Rate 80  BP 110/83  Temp 97.8 F (36.6 C)  SpO2 100 %  O2 Device Room Air  MEWS Score  MEWS RR 0  MEWS Pulse 0  MEWS Systolic 0  MEWS LOC 0  MEWS Temp 0  MEWS Score 0  MEWS Score Color Green  MEWS Assessment  Is this an acute change? No  MEWS Guidelines - (patients age 50 and over)  Red - At High Risk for Deterioration Yellow - At risk for Deterioration  1. Go to room and assess patient 2. Validate data. Is this patient's baseline? If data confirmed: 3. Is this an acute change? 4. Administer prn meds/treatments as ordered. 5. Note Sepsis score 6. Review goals of care 7. Sports coach, RRT nurse and Provider. 8. Ask Provider to come to bedside.  9. Document patient condition/interventions/response. 10. Increase frequency of vital signs and focused assessments to at least q15 minutes x 4, then q30 minutes x2. - If stable, then q1h x3, then q4h x3 and then q8h or dept. routine. - If unstable, contact Provider & RRT nurse. Prepare for possible transfer. 11. Add entry in progress notes using the smart phrase ".MEWS". 1. Go to room and assess patient 2. Validate data. Is this patient's baseline? If data confirmed: 3. Is this an acute change? 4. Administer prn meds/treatments as ordered? 5. Note Sepsis score 6. Review goals of care 7. Sports coach and Provider 8. Call RRT nurse as needed. 9. Document patient condition/interventions/response. 10. Increase frequency of vital signs and focused assessments to at least q2h x2. - If stable, then q4h x2 and then q8h or dept. routine. - If unstable, contact Provider & RRT nurse. Prepare for possible transfer. 11. Add entry in progress notes using the smart phrase ".MEWS".  Green - Likely stable Lavender - Comfort Care Only  1. Continue routine/ordered monitoring.  2. Review goals of care. 1. Continue routine/ordered monitoring. 2. Review goals of care.

## 2019-09-17 DIAGNOSIS — I6340 Cerebral infarction due to embolism of unspecified cerebral artery: Secondary | ICD-10-CM | POA: Diagnosis not present

## 2019-09-17 DIAGNOSIS — G9341 Metabolic encephalopathy: Secondary | ICD-10-CM | POA: Diagnosis not present

## 2019-09-17 LAB — GLUCOSE, CAPILLARY
Glucose-Capillary: 88 mg/dL (ref 70–99)
Glucose-Capillary: 118 mg/dL — ABNORMAL HIGH (ref 70–99)
Glucose-Capillary: 114 mg/dL — ABNORMAL HIGH (ref 70–99)
Glucose-Capillary: 165 mg/dL — ABNORMAL HIGH (ref 70–99)

## 2019-09-17 NOTE — Progress Notes (Signed)
Held am dose of metropolol due to HR being in the 40's this am. MD notfied

## 2019-09-17 NOTE — Progress Notes (Signed)
Patient with bigeminy while walking with PT patient asymptomatic.

## 2019-09-17 NOTE — Progress Notes (Signed)
Physical Therapy Treatment Patient Details Name: Tyler [name] MRN: [id number] DOB: 11/12/1971 Today's Date: 09/17/2019    History of Present Illness 48 y/o male transferred from [place] [facility] for unresponsiveness, likely secondary to substance abuse. Pt with rhabdomyolysis and AKI. EEG revealed moderate diffuse encephalopathy. PMH includes polysubstance abuse and HTN. Imaging show acute bialteral cerebellar infarcts.     PT Comments    Pt laying prone in bed on entry, eager to work with therapy when asked. Sitting on EoB without assist. Pt supervision for standing up, increased cuing needed for placement of gait belt prior to ambulation. Pt ambulates 500 feet with min guard for safety. Increase in HR to 139 bpm in first 250 feet of ambulation but settled back to 110's for remainder of treatment session. Introduced head turns and looking up and down with gait, decrease in gait speed and minor instability but no overt LoB. Pt able to walk backwards with min A and unable to figure out how to side step. Tried to engage pt in finding his way back to room by using room numbers. Pt only able to name room number 30% of the time and did not fully recognize his room when he reached it. D/c plans remain appropriate. PT will continue to follow acutely.     Follow Up Recommendations  SNF;Supervision/Assistance - 24 hour     Equipment Recommendations  None recommended by PT       Precautions / Restrictions Precautions Precautions: Fall Precaution Comments: poor safety awareness Restrictions Weight Bearing Restrictions: No    Mobility  Bed Mobility Overal bed mobility: Modified Independent             General bed mobility comments: laying prone in bed on entry, able to get to EoB with minor use of bedrail  Transfers Overall transfer level: Needs assistance Equipment used: None Transfers: Sit to/from Stand Sit to Stand: Supervision            Ambulation/Gait Ambulation/Gait  assistance: Min assist;Min guard   Assistive device: None Gait Pattern/deviations: Step-through pattern;Decreased stride length;Drifts right/left Gait velocity: slowed Gait velocity interpretation: <1.8 ft/sec, indicate of risk for recurrent falls General Gait Details: has difficulty negotiating obstacles, requires maximal verbal and tactile cuing for change in direction, able to ambulate backwards with hands on min guard could not follow how to sidestep      Modified Rankin (Stroke Patients Only) Modified Rankin (Stroke Patients Only) Pre-Morbid Rankin Score: No symptoms Modified Rankin: Moderately severe disability     Balance Overall balance assessment: Needs assistance   Sitting balance-Leahy Scale: Normal       Standing balance-Leahy Scale: Fair               High level balance activites: Side stepping;Backward walking;Direction changes;Head turns High Level Balance Comments: pt with decreased gait speed with head turns, requires hands on min guard for backward walking and unable to perform sidestepping             Cognition Arousal/Alertness: Awake/alert Behavior During Therapy: Impulsive Overall Cognitive Status: Impaired/Different from baseline Area of Impairment: Problem solving;Awareness;Safety/judgement;Following commands;Memory;Orientation;Attention                 Orientation Level: Disoriented to;Situation;Time;Place Current Attention Level: Selective Memory: Decreased short-term memory Following Commands: Follows one step commands inconsistently;Follows one step commands with increased time Safety/Judgement: Decreased awareness of safety;Decreased awareness of deficits Awareness: Intellectual Problem Solving: Slow processing;Requires verbal cues;Difficulty sequencing;Requires tactile cues General Comments: continues to be oriented only to self,  unable to find his way back to his room, laughs off confusion or does not respond when he does not  know answer         General Comments General comments (skin integrity, edema, etc.): Pt with initial increase in HR to 139 bpm with ambulation, however HR settled back to 110's with progression past 250 feet ambulation      Pertinent Vitals/Pain Faces Pain Scale: No hurt           PT Goals (current goals can now be found in the care plan section) Acute Rehab PT Goals Patient Stated Goal: not stated PT Goal Formulation: Patient unable to participate in goal setting Time For Goal Achievement: 09/21/19 Potential to Achieve Goals: Fair Progress towards PT goals: Progressing toward goals    Frequency    Min 2X/week      PT Plan Current plan remains appropriate       AM-PAC PT "6 Clicks" Mobility   Outcome Measure  Help needed turning from your back to your side while in a flat bed without using bedrails?: None Help needed moving from lying on your back to sitting on the side of a flat bed without using bedrails?: None Help needed moving to and from a bed to a chair (including a wheelchair)?: None Help needed standing up from a chair using your arms (e.g., wheelchair or bedside chair)?: None Help needed to walk in hospital room?: None Help needed climbing 3-5 steps with a railing? : A Little 6 Click Score: 23    End of Session Equipment Utilized During Treatment: Gait belt Activity Tolerance: Patient tolerated treatment well Patient left: in chair;with call bell/phone within reach;with chair alarm set Nurse Communication: Mobility status PT Visit Diagnosis: Muscle weakness (generalized) (M62.81);Difficulty in walking, not elsewhere classified (R26.2);Other abnormalities of gait and mobility (R26.89)     Time: 6578-4696 PT Time Calculation (min) (ACUTE ONLY): 20 min  Charges:  $Gait Training: 8-22 mins                     Verlie Liotta B. Migdalia Dk PT, DPT Acute Rehabilitation Services Pager [phone] Office [phone]    [place] 09/17/2019, 6:07 PM

## 2019-09-17 NOTE — Progress Notes (Signed)
Patient ID: Tyler [name], male   DOB: December 24, 1970, 48 y.o.   MRN: [id number]  PROGRESS NOTE    Tyler [name]  HKV:[id number] DOB: Sep 18, 1971 DOA: 07/23/2019 PCP: Patient, No Pcp Per   Brief Narrative:  48 year old male with history of polysubstance abuse including heroine presented to [facility] ER after being found down with a syringe nearby.  He was given Narcan with minimal improvement.  On presentation, he was found to have severe rhabdomyolysis with acute kidney injury and hyperkalemia.  He was started on IV fluids including bicarb drip and transferred to [facility] for further care.  Rhabdomyolysis improved but he had persistence of encephalopathy and was found to have acute bilateral cerebellar infarct.  Neurology was consulted and has subsequently signed off.  PT recommended SNF placement.  Prolonged hospitalization due to placement issues.  Patient is medically stable for discharge to a skilled nursing facility as soon as bed is available.  Assessment & Plan:  Acute bilateral CVA, nonhemorrhagic -MRI of the brain showed acute CVA -Neurology evaluated the patient and has signed off.  MRA of the head negative. -Ultrasound carotid showed bilateral 1 to 39% ICA stenosis -LDL 83 -A1c 5.1 -Echo showed EF of 60 to 65%.  Lower extremity Dopplers: Negative for DVT -Neurology recommended considering 30-day cardiac event monitoring as outpatient to rule out A. fib if patient neurologically improves -Continue statin.  Not on statin due to elevated LFTs -PT recommended SNF.  Awaiting SNF placement.  Acute metabolic encephalopathy -Multifactorial: Most likely secondary CVA, drug use, uremia, heavy alcohol use -CT of the head and neck was initially negative for any traumatic finding -UDS, Tylenol and salicylate levels were negative. -TSH, folate, B12 levels all within normal limits -EEG: Generalized diffuse slowing but no seizure activity -Had required intermittent restraints initially during the  hospitalization. -Fall precautions - Awaiting SNF placement.Difficult placement  -Waxing and waning mental status.  Still not fully oriented  Severe rhabdomyolysis/AKI -Resolved with IV fluids.  Renal ultrasound negative.  Off IV fluids  Transaminitis with elevated bilirubin -Probably from alcohol abuse -Right upper quadrant ultrasound showed hepatic steatosis with early cirrhosis with some gallbladder thickening -Monitor intermittently  Alcohol abuse -Advised to quit.  Continue thiamine, multivitamin and folic acid  UTI -Present on admission.  Completed 5 days of Rocephin  Essential hypertension -Stable.  Continue metoprolol.  Severe malnutrition -Follow nutrition recommendations    DVT prophylaxis: Lovenox Code Status: Full Family Communication: None at bedside Disposition Plan: SNF once bed is available.  Medically stable for discharge.  Consultants: Neurology  Procedures:  Echo IMPRESSIONS   1. The left ventricle has normal systolic function with an ejection fraction of 60-65%. The cavity size was normal. Left ventricular diastolic Doppler parameters are consistent with impaired relaxation. No evidence of left ventricular regional wall  motion abnormalities. 2. The right ventricle has normal systolic function. The cavity was normal. There is no increase in right ventricular wall thickness. Right ventricular systolic pressure is normal with an estimated pressure of 21.0 mmHg. 3. The aortic valve is tricuspid. Moderate sclerosis of the aortic valve. 4. The aorta is normal unless otherwise noted.  Antimicrobials:  Anti-infectives (From admission, onward)   Start     Dose/Rate Route Frequency Ordered Stop   07/25/19 0745  cefTRIAXone (ROCEPHIN) 1 g in sodium chloride 0.9 % 100 mL IVPB     1 g 200 mL/hr over 30 Minutes Intravenous Every 24 hours 07/25/19 0734 07/29/19 1518       Subjective: Patient seen and  examined at bedside.  Very poor historian.  No  fever or seizure activities reported by nursing staff.   Objective: Vitals:   09/16/19 1421 09/16/19 1436 09/16/19 2109 09/17/19 0514  BP:  93/76 110/83 98/70  Pulse: 85 70 80 67  Resp: 17 20 16 16   Temp: 97.7 F (36.5 C) (!) 97.4 F (36.3 C) 97.8 F (36.6 C) 98.2 F (36.8 C)  TempSrc: Oral Oral Oral Oral  SpO2:  100% 100% 99%  Weight:    67 kg  Height:        Intake/Output Summary (Last 24 hours) at 09/17/2019 0738 Last data filed at 09/16/2019 1700 Gross per 24 hour  Intake 1380 ml  Output -  Net 1380 ml   Filed Weights   09/14/19 0305 09/16/19 0517 09/17/19 0514  Weight: 65 kg 67.7 kg 67 kg    Examination: General exam:No acute distress.  Hardly participates in any conversation.  Respiratory system: Bilateral decreased breath sounds at bases  cardiovascular system:S1-S2 heard, rate controlled gastrointestinal system:Abdomen is nondistended, soft and nontender. Normal bowel sounds heard. Extremities: No cyanosis, edema    Data Reviewed: I have personally reviewed following labs and imaging studies  CBC: Recent Labs  Lab 09/11/19 0228  WBC 7.6  HGB 12.8*  HCT 38.5*  MCV 97.0  PLT 412   Basic Metabolic Panel: Recent Labs  Lab 09/11/19 0228  NA 142  K 4.4  CL 105  CO2 25  GLUCOSE 93  BUN 26*  CREATININE 1.25*  CALCIUM 9.4   GFR: Estimated Creatinine Clearance: 67 mL/min (A) (by C-G formula based on SCr of 1.25 mg/dL (H)). Liver Function Tests: No results for input(s): AST, ALT, ALKPHOS, BILITOT, PROT, ALBUMIN in the last 168 hours. No results for input(s): LIPASE, AMYLASE in the last 168 hours. No results for input(s): AMMONIA in the last 168 hours. Coagulation Profile: No results for input(s): INR, PROTIME in the last 168 hours. Cardiac Enzymes: No results for input(s): CKTOTAL, CKMB, CKMBINDEX, TROPONINI in the last 168 hours. BNP (last 3 results) No results for input(s): PROBNP in the last 8760 hours. HbA1C: No results for input(s):  HGBA1C in the last 72 hours. CBG: Recent Labs  Lab 09/15/19 2228 09/16/19 0807 09/16/19 1158 09/16/19 1713 09/16/19 2115  GLUCAP 95 91 93 103* 125*   Lipid Profile: No results for input(s): CHOL, HDL, LDLCALC, TRIG, CHOLHDL, LDLDIRECT in the last 72 hours. Thyroid Function Tests: No results for input(s): TSH, T4TOTAL, FREET4, T3FREE, THYROIDAB in the last 72 hours. Anemia Panel: No results for input(s): VITAMINB12, FOLATE, FERRITIN, TIBC, IRON, RETICCTPCT in the last 72 hours. Sepsis Labs: No results for input(s): PROCALCITON, LATICACIDVEN in the last 168 hours.  No results found for this or any previous visit (from the past 240 hour(s)).       Radiology Studies: No results found.      Scheduled Meds: .  stroke: mapping our early stages of recovery book   Does not apply Once  . aspirin  325 mg Oral Daily  . enoxaparin (LOVENOX) injection  40 mg Subcutaneous Q24H  . feeding supplement (ENSURE ENLIVE)  237 mL Oral BID BM  . folic acid  1 mg Oral Daily  . insulin aspart  0-15 Units Subcutaneous TID WC  . insulin aspart  0-5 Units Subcutaneous QHS  . lactulose  20 g Oral BID  . metoprolol tartrate  25 mg Oral BID  . multivitamin with minerals  1 tablet Oral Daily  . thiamine  100 mg Oral Daily   Or  . thiamine  100 mg Intravenous Daily   Continuous Infusions:        Glade Lloyd, MD [facility] 09/17/2019, 7:38 AM

## 2019-09-18 DIAGNOSIS — G9341 Metabolic encephalopathy: Secondary | ICD-10-CM | POA: Diagnosis not present

## 2019-09-18 DIAGNOSIS — I6340 Cerebral infarction due to embolism of unspecified cerebral artery: Secondary | ICD-10-CM | POA: Diagnosis not present

## 2019-09-18 LAB — GLUCOSE, CAPILLARY
Glucose-Capillary: 82 mg/dL (ref 70–99)
Glucose-Capillary: 89 mg/dL (ref 70–99)
Glucose-Capillary: 141 mg/dL — ABNORMAL HIGH (ref 70–99)
Glucose-Capillary: 93 mg/dL (ref 70–99)

## 2019-09-18 NOTE — Progress Notes (Signed)
Nutrition Follow-up  RD working remotely.  DOCUMENTATION CODES:   Severe malnutrition in context of acute illness/injury  INTERVENTION:   -Continue MVI with minerals daily -ContinueMagic cup TID with meals, each supplement provides 290 kcal and 9 grams of protein -ContinueEnsure Enlive po BID, each supplement provides 350 kcal and 20 grams of protein  NUTRITION DIAGNOSIS:   Severe Malnutrition related to acute illness(acute toxic/metabolic encephalopathy) as evidenced by moderate fat depletion, severe fat depletion, moderate muscle depletion, severe muscle depletion, energy intake < or equal to 50% for > or equal to 5 days, percent weight loss.  Ongoing  GOAL:   Patient will meet greater than or equal to 90% of their needs  Progressing   MONITOR:   PO intake, Supplement acceptance, Labs, Weight trends, Skin, I & O's  REASON FOR ASSESSMENT:   LOS    ASSESSMENT:   Tyler [name] is a 49 y.o. male with medical history significant of polysubstance abuse including heroin.  Brought in to [facility] ED just before MN after being found down by friend last evening with nearby syringe.  Reviewed I/O's: +960 ml x 24 hours and +8.1 L since 09/04/19  Pt remains with good appetite. Noted meal completion 100%. Pt remains compliant with Ensure supplements.  Reviewed wt hx; noted pt has experienced a 6.6% wt gain over the past month, which is favorable given pt's malnutrition.   Medications reviewed and include lactulose, folvite, MVI, and vitamin B-1.   Per CSW notes, pt remains difficult to place. Awaiting SNF placement for discharge.   Labs reviewed: CBGS: 88-165 (inpatient orders for glycemic control are 0-15 units insulin aspart TID with meals and 0-5 units insulin aspart q HS).   Diet Order:   Diet Order            DIET DYS 3 Room service appropriate? No; Fluid consistency: Thin  Diet effective now              EDUCATION NEEDS:   Not appropriate for education at this  time  Skin:  Skin Assessment: Reviewed RN Assessment  Last BM:  09/17/19  Height:   Ht Readings from Last 1 Encounters:  08/09/19 5\' 8"  (1.727 m)    Weight:   Wt Readings from Last 1 Encounters:  09/18/19 67 kg    Ideal Body Weight:  70 kg  BMI:  Body mass index is 22.46 kg/m.  Estimated Nutritional Needs:   Kcal:  2000-2200  Protein:  105-120 grams  Fluid:  > 2 L    Osei Anger A. Jimmye Norman, RD, LDN, [place] Registered Dietitian II Certified Diabetes Care and Education Specialist Pager: [phone] After hours Pager: [phone]

## 2019-09-18 NOTE — Progress Notes (Signed)
Patient ID: Tyler [name], male   DOB: December 24, 1970, 48 y.o.   MRN: [id number]  PROGRESS NOTE    Tyler [name]  HKV:[id number] DOB: Sep 18, 1971 DOA: 07/23/2019 PCP: Patient, No Pcp Per   Brief Narrative:  48 year old male with history of polysubstance abuse including heroine presented to [facility] ER after being found down with a syringe nearby.  He was given Narcan with minimal improvement.  On presentation, he was found to have severe rhabdomyolysis with acute kidney injury and hyperkalemia.  He was started on IV fluids including bicarb drip and transferred to [facility] for further care.  Rhabdomyolysis improved but he had persistence of encephalopathy and was found to have acute bilateral cerebellar infarct.  Neurology was consulted and has subsequently signed off.  PT recommended SNF placement.  Prolonged hospitalization due to placement issues.  Patient is medically stable for discharge to a skilled nursing facility as soon as bed is available.  Assessment & Plan:  Acute bilateral CVA, nonhemorrhagic -MRI of the brain showed acute CVA -Neurology evaluated the patient and has signed off.  MRA of the head negative. -Ultrasound carotid showed bilateral 1 to 39% ICA stenosis -LDL 83 -A1c 5.1 -Echo showed EF of 60 to 65%.  Lower extremity Dopplers: Negative for DVT -Neurology recommended considering 30-day cardiac event monitoring as outpatient to rule out A. fib if patient neurologically improves -Continue statin.  Not on statin due to elevated LFTs -PT recommended SNF.  Awaiting SNF placement.  Acute metabolic encephalopathy -Multifactorial: Most likely secondary CVA, drug use, uremia, heavy alcohol use -CT of the head and neck was initially negative for any traumatic finding -UDS, Tylenol and salicylate levels were negative. -TSH, folate, B12 levels all within normal limits -EEG: Generalized diffuse slowing but no seizure activity -Had required intermittent restraints initially during the  hospitalization. -Fall precautions - Awaiting SNF placement.Difficult placement  -Waxing and waning mental status.  Still not fully oriented  Severe rhabdomyolysis/AKI -Resolved with IV fluids.  Renal ultrasound negative.  Off IV fluids  Transaminitis with elevated bilirubin -Probably from alcohol abuse -Right upper quadrant ultrasound showed hepatic steatosis with early cirrhosis with some gallbladder thickening -Monitor intermittently  Alcohol abuse -Advised to quit.  Continue thiamine, multivitamin and folic acid  UTI -Present on admission.  Completed 5 days of Rocephin  Essential hypertension -Stable.  Continue metoprolol.  Severe malnutrition -Follow nutrition recommendations    DVT prophylaxis: Lovenox Code Status: Full Family Communication: None at bedside Disposition Plan: SNF once bed is available.  Medically stable for discharge.  Consultants: Neurology  Procedures:  Echo IMPRESSIONS   1. The left ventricle has normal systolic function with an ejection fraction of 60-65%. The cavity size was normal. Left ventricular diastolic Doppler parameters are consistent with impaired relaxation. No evidence of left ventricular regional wall  motion abnormalities. 2. The right ventricle has normal systolic function. The cavity was normal. There is no increase in right ventricular wall thickness. Right ventricular systolic pressure is normal with an estimated pressure of 21.0 mmHg. 3. The aortic valve is tricuspid. Moderate sclerosis of the aortic valve. 4. The aorta is normal unless otherwise noted.  Antimicrobials:  Anti-infectives (From admission, onward)   Start     Dose/Rate Route Frequency Ordered Stop   07/25/19 0745  cefTRIAXone (ROCEPHIN) 1 g in sodium chloride 0.9 % 100 mL IVPB     1 g 200 mL/hr over 30 Minutes Intravenous Every 24 hours 07/25/19 0734 07/29/19 1518       Subjective: Patient seen and  examined at bedside.  Very poor historian.   Hardly participates in any conversation.  No seizure or fever reported by nursing staff.   Objective: Vitals:   09/17/19 2131 09/17/19 2312 09/18/19 0414 09/18/19 0655  BP: 95/64 118/67  94/62  Pulse: 71 79  76  Resp: 16   16  Temp: 98.4 F (36.9 C)   98.2 F (36.8 C)  TempSrc:    Oral  SpO2: 100%   98%  Weight:   67 kg   Height:        Intake/Output Summary (Last 24 hours) at 09/18/2019 0749 Last data filed at 09/17/2019 1512 Gross per 24 hour  Intake 960 ml  Output -  Net 960 ml   Filed Weights   09/16/19 0517 09/17/19 0514 09/18/19 0414  Weight: 67.7 kg 67 kg 67 kg    Examination: General exam:No distress.  Does not participate in any conversation.  Respiratory system: Bilateral decreased breath sounds at bases  cardiovascular system:S1-S2 heard, rate controlled gastrointestinal system:Abdomen is nondistended, soft and nontender. Normal bowel sounds heard. Extremities: No cyanosis, edema    Data Reviewed: I have personally reviewed following labs and imaging studies  CBC: No results for input(s): WBC, NEUTROABS, HGB, HCT, MCV, PLT in the last 168 hours. Basic Metabolic Panel: No results for input(s): NA, K, CL, CO2, GLUCOSE, BUN, CREATININE, CALCIUM, MG, PHOS in the last 168 hours. GFR: Estimated Creatinine Clearance: 67 mL/min (A) (by C-G formula based on SCr of 1.25 mg/dL (H)). Liver Function Tests: No results for input(s): AST, ALT, ALKPHOS, BILITOT, PROT, ALBUMIN in the last 168 hours. No results for input(s): LIPASE, AMYLASE in the last 168 hours. No results for input(s): AMMONIA in the last 168 hours. Coagulation Profile: No results for input(s): INR, PROTIME in the last 168 hours. Cardiac Enzymes: No results for input(s): CKTOTAL, CKMB, CKMBINDEX, TROPONINI in the last 168 hours. BNP (last 3 results) No results for input(s): PROBNP in the last 8760 hours. HbA1C: No results for input(s): HGBA1C in the last 72 hours. CBG: Recent Labs  Lab 09/16/19  2115 09/17/19 0801 09/17/19 1254 09/17/19 1638 09/17/19 2126  GLUCAP 125* 88 118* 114* 165*   Lipid Profile: No results for input(s): CHOL, HDL, LDLCALC, TRIG, CHOLHDL, LDLDIRECT in the last 72 hours. Thyroid Function Tests: No results for input(s): TSH, T4TOTAL, FREET4, T3FREE, THYROIDAB in the last 72 hours. Anemia Panel: No results for input(s): VITAMINB12, FOLATE, FERRITIN, TIBC, IRON, RETICCTPCT in the last 72 hours. Sepsis Labs: No results for input(s): PROCALCITON, LATICACIDVEN in the last 168 hours.  No results found for this or any previous visit (from the past 240 hour(s)).       Radiology Studies: No results found.      Scheduled Meds: .  stroke: mapping our early stages of recovery book   Does not apply Once  . aspirin  325 mg Oral Daily  . enoxaparin (LOVENOX) injection  40 mg Subcutaneous Q24H  . feeding supplement (ENSURE ENLIVE)  237 mL Oral BID BM  . folic acid  1 mg Oral Daily  . insulin aspart  0-15 Units Subcutaneous TID WC  . insulin aspart  0-5 Units Subcutaneous QHS  . lactulose  20 g Oral BID  . metoprolol tartrate  25 mg Oral BID  . multivitamin with minerals  1 tablet Oral Daily  . thiamine  100 mg Oral Daily   Or  . thiamine  100 mg Intravenous Daily   Continuous Infusions:  Aline August, MD [facility] 09/18/2019, 7:49 AM

## 2019-09-19 DIAGNOSIS — N179 Acute kidney failure, unspecified: Secondary | ICD-10-CM | POA: Diagnosis not present

## 2019-09-19 DIAGNOSIS — M6282 Rhabdomyolysis: Secondary | ICD-10-CM | POA: Diagnosis not present

## 2019-09-19 DIAGNOSIS — I6340 Cerebral infarction due to embolism of unspecified cerebral artery: Secondary | ICD-10-CM | POA: Diagnosis not present

## 2019-09-19 DIAGNOSIS — G9341 Metabolic encephalopathy: Secondary | ICD-10-CM | POA: Diagnosis not present

## 2019-09-19 LAB — COMPREHENSIVE METABOLIC PANEL
ALT: 101 U/L — ABNORMAL HIGH (ref 0–44)
AST: 71 U/L — ABNORMAL HIGH (ref 15–41)
Albumin: 3.1 g/dL — ABNORMAL LOW (ref 3.5–5.0)
Alkaline Phosphatase: 73 U/L (ref 38–126)
Anion gap: 12 (ref 5–15)
BUN: 28 mg/dL — ABNORMAL HIGH (ref 6–20)
CO2: 21 mmol/L — ABNORMAL LOW (ref 22–32)
Calcium: 9 mg/dL (ref 8.9–10.3)
Chloride: 106 mmol/L (ref 98–111)
Creatinine, Ser: 1.3 mg/dL — ABNORMAL HIGH (ref 0.61–1.24)
GFR calc Af Amer: >60 mL/min (ref >60)
GFR calc non Af Amer: >60 mL/min (ref >60)
Glucose, Bld: 89 mg/dL (ref 70–99)
Potassium: 4.3 mmol/L (ref 3.5–5.1)
Sodium: 139 mmol/L (ref 135–145)
Total Bilirubin: 0.7 mg/dL (ref 0.3–1.2)
Total Protein: 6.7 g/dL (ref 6.5–8.1)

## 2019-09-19 LAB — GLUCOSE, CAPILLARY
Glucose-Capillary: 121 mg/dL — ABNORMAL HIGH (ref 70–99)
Glucose-Capillary: 80 mg/dL (ref 70–99)

## 2019-09-19 LAB — CBC WITH DIFFERENTIAL/PLATELET
Abs Immature Granulocytes: 0.06 10*3/uL (ref 0.00–0.07)
Basophils Absolute: 0 10*3/uL (ref 0.0–0.1)
Basophils Relative: 0 %
Eosinophils Absolute: 0.5 10*3/uL (ref 0.0–0.5)
Eosinophils Relative: 7 %
HCT: 37.6 % — ABNORMAL LOW (ref 39.0–52.0)
Hemoglobin: 12.5 g/dL — ABNORMAL LOW (ref 13.0–17.0)
Immature Granulocytes: 1 %
Lymphocytes Relative: 31 %
Lymphs Abs: 2.5 10*3/uL (ref 0.7–4.0)
MCH: 31.7 pg (ref 26.0–34.0)
MCHC: 33.2 g/dL (ref 30.0–36.0)
MCV: 95.4 fL (ref 80.0–100.0)
Monocytes Absolute: 1 10*3/uL (ref 0.1–1.0)
Monocytes Relative: 12 %
Neutro Abs: 4 10*3/uL (ref 1.7–7.7)
Neutrophils Relative %: 49 %
Platelets: 260 10*3/uL (ref 150–400)
RBC: 3.94 MIL/uL — ABNORMAL LOW (ref 4.22–5.81)
RDW: 13.2 % (ref 11.5–15.5)
WBC: 8.1 10*3/uL (ref 4.0–10.5)
nRBC: 0 % (ref 0.0–0.2)

## 2019-09-19 LAB — MAGNESIUM: Magnesium: 2 mg/dL (ref 1.7–2.4)

## 2019-09-19 NOTE — Progress Notes (Signed)
Physical Therapy Treatment Patient Details Name: Tyler [name] MRN: [id number] DOB: Sep 11, 1971 Today's Date: 09/19/2019    History of Present Illness 48 y/o male transferred from [place] for unresponsiveness, likely secondary to substance abuse. Pt with rhabdomyolysis and AKI. EEG revealed moderate diffuse encephalopathy. PMH includes polysubstance abuse and HTN. Imaging show acute bialteral cerebellar infarcts.     PT Comments    Patient received sitting up in recliner. Alert, not oriented. Patient requires close supervision for all mobility due to decreased awareness of environment, limited by cognitive deficits.  Difficulty following direction, problem solving. Patient will continue to benefit from skilled PT while here to improve safety with mobility.    Follow Up Recommendations  Supervision/Assistance - 24 hour;SNF     Equipment Recommendations  None recommended by PT    Recommendations for Other Services       Precautions / Restrictions Precautions Precautions: Fall Precaution Comments: poor safety awareness Restrictions Weight Bearing Restrictions: No    Mobility  Bed Mobility               General bed mobility comments: patient received up in recliner  Transfers Overall transfer level: Needs assistance Equipment used: None Transfers: Sit to/from Stand Sit to Stand: Supervision         General transfer comment: sup/min guard safety  Ambulation/Gait   Gait Distance (Feet): 500 Feet Assistive device: None   Gait velocity: WNL   General Gait Details: close supervision, tactile and verbal cues for obstacle avoidance in hallway, Unaware of environment- when directed to turn around he states "I'm not sure how to do that." Pressing on wall.   Stairs             Wheelchair Mobility    Modified Rankin (Stroke Patients Only) Modified Rankin (Stroke Patients Only) Pre-Morbid Rankin Score: No symptoms Modified Rankin: Moderately  severe disability     Balance Overall balance assessment: Needs assistance Sitting-balance support: Feet supported Sitting balance-Leahy Scale: Normal Sitting balance - Comments: able to sit unsupport with no LOB   Standing balance support: During functional activity;No upper extremity supported Standing balance-Leahy Scale: Fair Standing balance comment: poor awareness of environment                            Cognition Arousal/Alertness: Awake/alert Behavior During Therapy: Impulsive Overall Cognitive Status: Impaired/Different from baseline Area of Impairment: Orientation;Awareness;Attention;Problem solving;Memory;Following commands;Safety/judgement                 Orientation Level: Disoriented to;Place;Time;Situation Current Attention Level: Selective Memory: Decreased short-term memory Following Commands: Follows one step commands inconsistently;Follows one step commands with increased time Safety/Judgement: Decreased awareness of safety;Decreased awareness of deficits Awareness: Intellectual Problem Solving: Slow processing;Requires verbal cues;Requires tactile cues General Comments: continues to be oriented only to self, unable to find his way back to his room, could not read room signs in hallway, trying to go into other rooms that are not his.      Exercises      General Comments        Pertinent Vitals/Pain Pain Assessment: No/denies pain    Home Living Family/patient expects to be discharged to:: Unsure                    Prior Function            PT Goals (current goals can now be found in the care plan section) Acute Rehab PT Goals Patient  Stated Goal: not stated PT Goal Formulation: Patient unable to participate in goal setting Time For Goal Achievement: 10/05/19 Potential to Achieve Goals: Fair Progress towards PT goals: Progressing toward goals    Frequency    Min 2X/week      PT Plan Current plan remains  appropriate    Co-evaluation              AM-PAC PT "6 Clicks" Mobility   Outcome Measure  Help needed turning from your back to your side while in a flat bed without using bedrails?: None Help needed moving from lying on your back to sitting on the side of a flat bed without using bedrails?: None Help needed moving to and from a bed to a chair (including a wheelchair)?: None Help needed standing up from a chair using your arms (e.g., wheelchair or bedside chair)?: None Help needed to walk in hospital room?: None Help needed climbing 3-5 steps with a railing? : A Little 6 Click Score: 23    End of Session Equipment Utilized During Treatment: Gait belt Activity Tolerance: Patient tolerated treatment well Patient left: in chair;with chair alarm set;with call bell/phone within reach Nurse Communication: Mobility status PT Visit Diagnosis: Difficulty in walking, not elsewhere classified (R26.2);Unsteadiness on feet (R26.81);Other symptoms and signs involving the nervous system (R29.898)     Time: 1345-1400 PT Time Calculation (min) (ACUTE ONLY): 15 min  Charges:  $Gait Training: 8-22 mins                     Odysseus Cada, PT, GCS 09/19/19,2:13 PM

## 2019-09-19 NOTE — Progress Notes (Signed)
Occupational Therapy Treatment Patient Details Name: Tyler [name] MRN: [id number] DOB: 02-16-1971 Today's Date: 09/19/2019    History of present illness 48 y/o male transferred from [place] [facility] for unresponsiveness, likely secondary to substance abuse. Pt with rhabdomyolysis and AKI. EEG revealed moderate diffuse encephalopathy. PMH includes polysubstance abuse and HTN. Imaging show acute bialteral cerebellar infarcts.    OT comments  Pt making progress with functional goals, is alert but confused and impulsive.  Pt requires close supervision for all ADLs and ADL mobility due to Poor/decreased awareness of environment, limited by cognitive deficits.  Difficulty following directions, problem solving. OT will continue to follow acutely  Follow Up Recommendations  SNF;Supervision/Assistance - 24 hour    Equipment Recommendations  Other (comment)(TBD at next venue of care)    Recommendations for Other Services      Precautions / Restrictions Precautions Precautions: Fall Precaution Comments: poor safety awareness Restrictions Weight Bearing Restrictions: No       Mobility Bed Mobility Overal bed mobility: Modified Independent Bed Mobility: Supine to Sit     Supine to sit: Supervision     General bed mobility comments: patient received up in recliner  Transfers Overall transfer level: Needs assistance Equipment used: None Transfers: Sit to/from Stand Sit to Stand: Supervision         General transfer comment: sup/min guard safety    Balance Overall balance assessment: Needs assistance Sitting-balance support: Feet supported Sitting balance-Leahy Scale: Normal Sitting balance - Comments: able to sit unsupport with no LOB   Standing balance support: No upper extremity supported;During functional activity Standing balance-Leahy Scale: Fair Standing balance comment: poor awareness of environment                           ADL either performed or  assessed with clinical judgement   ADL Overall ADL's : Needs assistance/impaired Eating/Feeding: Independent;Sitting   Grooming: Wash/dry face;Cueing for safety;Cueing for sequencing;Standing;Wash/dry hands;Min guard;Supervision/safety           Upper Body Dressing : Min guard;Standing   Lower Body Dressing: Sit to/from stand   Toilet Transfer: Supervision/safety;Min guard;Cueing for safety;Cueing for sequencing   Toileting- Architect and Hygiene: Min guard;Sit to/from stand       Functional mobility during ADLs: Min guard;Supervision/safety;Cueing for safety;Cueing for sequencing General ADL Comments: pt unable to identify/locate ADL/toiletry items on sink counter top     Vision Patient Visual Report: No change from baseline     Perception     Praxis      Cognition Arousal/Alertness: Awake/alert Behavior During Therapy: Impulsive Overall Cognitive Status: Impaired/Different from baseline Area of Impairment: Orientation;Awareness;Attention;Problem solving;Memory;Following commands;Safety/judgement                 Orientation Level: Disoriented to;Place;Time;Situation Current Attention Level: Selective Memory: Decreased short-term memory Following Commands: Follows one step commands inconsistently;Follows one step commands with increased time Safety/Judgement: Decreased awareness of safety;Decreased awareness of deficits Awareness: Intellectual Problem Solving: Slow processing;Requires verbal cues;Requires tactile cues General Comments: continues to be oriented only to self, unable to find his way back to his room, could not read room signs in hallway, trying to go into other rooms that are not his.        Exercises     Shoulder Instructions       General Comments      Pertinent Vitals/ Pain       Pain Assessment: No/denies pain Pain Score: 0-No pain  Home Living Family/patient expects  to be discharged to:: Unsure                                         Prior Functioning/Environment              Frequency  Min 2X/week        Progress Toward Goals  OT Goals(current goals can now be found in the care plan section)  Progress towards OT goals: Progressing toward goals  Acute Rehab OT Goals Patient Stated Goal: not stated ADL Goals Pt Will Perform Grooming: with set-up;with supervision;standing Pt Will Perform Upper Body Bathing: with set-up;with supervision;standing Pt Will Perform Lower Body Bathing: with min guard assist;with supervision;with set-up;sit to/from stand Pt Will Perform Upper Body Dressing: with supervision;with set-up;standing Pt Will Perform Lower Body Dressing: with min guard assist;with supervision;with set-up;sit to/from stand Pt Will Transfer to Toilet: with modified independence;Independently;ambulating Pt Will Perform Toileting - Clothing Manipulation and hygiene: with supervision;sit to/from stand Pt Will Perform Tub/Shower Transfer: with modified independence;ambulating;shower seat;3 in 1;grab bars Additional ADL Goal #1: pt will identify 3/3 ADL/toiletry items and retrieve them in prep for selfcare  Plan Discharge plan remains appropriate;Frequency remains appropriate    Co-evaluation                 AM-PAC OT "6 Clicks" Daily Activity     Outcome Measure   Help from another person eating meals?: None Help from another person taking care of personal grooming?: A Little Help from another person toileting, which includes using toliet, bedpan, or urinal?: A Little Help from another person bathing (including washing, rinsing, drying)?: A Lot Help from another person to put on and taking off regular upper body clothing?: A Little Help from another person to put on and taking off regular lower body clothing?: A Little 6 Click Score: 18    End of Session Equipment Utilized During Treatment: Gait belt  OT Visit Diagnosis: Other abnormalities of gait and  mobility (R26.89);Other symptoms and signs involving cognitive function;Other symptoms and signs involving the nervous system (R29.898);Muscle weakness (generalized) (M62.81)   Activity Tolerance Patient tolerated treatment well   Patient Left in chair;with call bell/phone within reach;with chair alarm set   Nurse Communication          Time: 1016-1040 OT Time Calculation (min): 24 min  Charges: OT General Charges $OT Visit: 1 Visit OT Treatments $Self Care/Home Management : 8-22 mins $Therapeutic Activity: 8-22 mins     Britt Bottom 09/19/2019, 2:22 PM

## 2019-09-19 NOTE — Progress Notes (Signed)
[facility] PROGRESS NOTE  Tyler [name] YYT:[id number] DOB: [birth year] DOA: 07/23/2019 PCP: Patient, No Pcp Per  Brief summary   48 year old male with history of polysubstance abuse including heroine presented to [facility] ER after being found down with a syringe nearby.  He was given Narcan with minimal improvement.  On presentation, he was found to have severe rhabdomyolysis with acute kidney injury and hyperkalemia.  He was started on IV fluids including bicarb drip and transferred to [facility] for further care.  Rhabdomyolysis improved but he had persistence of encephalopathy and was found to have acute bilateral cerebellar infarct.  Neurology was consulted and has subsequently signed off.  PT recommended SNF placement.  Prolonged hospitalization due to placement issues.  Patient is medically stable for discharge to a skilled nursing facility as soon as bed is available.  Assessment/Plan:  Acute bilateral CVA, nonhemorrhagic. MRI of the brain showed acute CVA. Neurology evaluated the patient and has signed off.  MRA of the head negative. Ultrasound carotid showed bilateral 1 to 39% ICA stenosis. LDL 83. A1c 5.1. Echo showed EF of 60 to 65%.  Lower extremity Dopplers: Negative for DVT -Neurology recommended considering 30-day cardiac event monitoring as outpatient to rule out A. fib if patient neurologically improves. Not on statin due to elevated LFTs -PT recommended SNF.  Awaiting SNF placement.  Acute metabolic encephalopathy. Improved. Multifactorial: Most likely secondary CVA, drug use, uremia, heavy alcohol use -CT of the head and neck was initially negative for any traumatic finding -UDS, Tylenol and salicylate levels were negative. -TSH, folate, B12 levels all within normal limits -EEG: Generalized diffuse slowing but no seizure activity -Had required intermittent restraints initially during the hospitalization. -Fall precautions - Awaiting SNF placement.Difficult placement.  Waxing and waning mental status.  Still not fully oriented  Severe rhabdomyolysis/AKI. Resolved with IV fluids.  Renal ultrasound negative.  Off IV fluids  Transaminitis with elevated bilirubin. Probably from alcohol abuse -Right upper quadrant ultrasound showed hepatic steatosis with early cirrhosis with some gallbladder thickening -Monitor intermittently  Alcohol abuse -Advised to quit.  Continue thiamine, multivitamin and folic acid  UTI. Present on admission.  Completed 5 days of Rocephin  Essential hypertension. Stable.  Continue metoprolol.  Severe malnutrition. cont nutrition support   Dispo: awaiting SNF  Code Status: full Family Communication: d/w patient, RN (indicate person spoken with, relationship, and if by phone, the number) Disposition Plan: awaiting SNF   Consultants:  Neurology   Procedures:  Echo   Antibiotics: Anti-infectives (From admission, onward)   Start     Dose/Rate Route Frequency Ordered Stop   07/25/19 0745  cefTRIAXone (ROCEPHIN) 1 g in sodium chloride 0.9 % 100 mL IVPB     1 g 200 mL/hr over 30 Minutes Intravenous Every 24 hours 07/25/19 0734 07/29/19 1518        (indicate start date, and stop date if known)  HPI/Subjective: No acute distress. He reports feeling better. No new focal neuro symptoms   Objective: Vitals:   09/18/19 2135 09/19/19 0627  BP: 97/74 111/90  Pulse: 66 66  Resp:  16  Temp:  97.9 F (36.6 C)  SpO2:  100%    Intake/Output Summary (Last 24 hours) at 09/19/2019 0938 Last data filed at 09/19/2019 0900 Gross per 24 hour  Intake 953 ml  Output -  Net 953 ml   Filed Weights   09/17/19 0514 09/18/19 0414 09/19/19 0500  Weight: 67 kg 67 kg 65 kg    Exam:   General:  No distress   Cardiovascular: s1,s2 rrr  Respiratory: CTA BL  Abdomen: soft, nt   Musculoskeletal: no leg edema    Data Reviewed: Basic Metabolic Panel: Recent Labs  Lab 09/19/19 0347  NA 139  K 4.3  CL 106  CO2  21*  GLUCOSE 89  BUN 28*  CREATININE 1.30*  CALCIUM 9.0  MG 2.0   Liver Function Tests: Recent Labs  Lab 09/19/19 0347  AST 71*  ALT 101*  ALKPHOS 73  BILITOT 0.7  PROT 6.7  ALBUMIN 3.1*   No results for input(s): LIPASE, AMYLASE in the last 168 hours. No results for input(s): AMMONIA in the last 168 hours. CBC: Recent Labs  Lab 09/19/19 0347  WBC 8.1  NEUTROABS 4.0  HGB 12.5*  HCT 37.6*  MCV 95.4  PLT 260   Cardiac Enzymes: No results for input(s): CKTOTAL, CKMB, CKMBINDEX, TROPONINI in the last 168 hours. BNP (last 3 results) No results for input(s): BNP in the last 8760 hours.  ProBNP (last 3 results) No results for input(s): PROBNP in the last 8760 hours.  CBG: Recent Labs  Lab 09/18/19 1218 09/18/19 1611 09/18/19 1752 09/18/19 2109 09/19/19 0811  GLUCAP 89 141* 93 121* 80    No results found for this or any previous visit (from the past 240 hour(s)).   Studies: No results found.  Scheduled Meds: .  stroke: mapping our early stages of recovery book   Does not apply Once  . aspirin  325 mg Oral Daily  . enoxaparin (LOVENOX) injection  40 mg Subcutaneous Q24H  . feeding supplement (ENSURE ENLIVE)  237 mL Oral BID BM  . folic acid  1 mg Oral Daily  . insulin aspart  0-15 Units Subcutaneous TID WC  . insulin aspart  0-5 Units Subcutaneous QHS  . lactulose  20 g Oral BID  . metoprolol tartrate  25 mg Oral BID  . multivitamin with minerals  1 tablet Oral Daily  . thiamine  100 mg Oral Daily   Or  . thiamine  100 mg Intravenous Daily   Continuous Infusions:  Principal Problem:   Rhabdomyolysis Active Problems:   Acute kidney failure (HCC)   Acute metabolic encephalopathy   Polysubstance abuse (HCC)   Hyperkalemia   Transaminitis   HTN (hypertension)   Cerebral embolism with cerebral infarction   Protein-calorie malnutrition, severe    Time spent: >25 minutes     Esperanza Sheets  [facility] Pager [phone]. If 7PM-7AM,  please contact night-coverage at [email or web address], password [facility] 09/19/2019, 9:38 AM  LOS: 58 days

## 2019-09-19 NOTE — Progress Notes (Signed)
   09/19/19 2110  MEWS Score  Resp 18  Pulse Rate 96  BP (!) 119/99  Temp 97.7 F (36.5 C)  SpO2 97 %  O2 Device Room Air  MEWS Score  MEWS RR 0  MEWS Pulse 0  MEWS Systolic 0  MEWS LOC 0  MEWS Temp 0  MEWS Score 0  MEWS Score Color Green  MEWS Assessment  Is this an acute change? No  MEWS Guidelines - (patients age 50 and over)  Red - At High Risk for Deterioration Yellow - At risk for Deterioration  1. Go to room and assess patient 2. Validate data. Is this patient's baseline? If data confirmed: 3. Is this an acute change? 4. Administer prn meds/treatments as ordered. 5. Note Sepsis score 6. Review goals of care 7. Sports coach, RRT nurse and Provider. 8. Ask Provider to come to bedside.  9. Document patient condition/interventions/response. 10. Increase frequency of vital signs and focused assessments to at least q15 minutes x 4, then q30 minutes x2. - If stable, then q1h x3, then q4h x3 and then q8h or dept. routine. - If unstable, contact Provider & RRT nurse. Prepare for possible transfer. 11. Add entry in progress notes using the smart phrase ".MEWS". 1. Go to room and assess patient 2. Validate data. Is this patient's baseline? If data confirmed: 3. Is this an acute change? 4. Administer prn meds/treatments as ordered? 5. Note Sepsis score 6. Review goals of care 7. Sports coach and Provider 8. Call RRT nurse as needed. 9. Document patient condition/interventions/response. 10. Increase frequency of vital signs and focused assessments to at least q2h x2. - If stable, then q4h x2 and then q8h or dept. routine. - If unstable, contact Provider & RRT nurse. Prepare for possible transfer. 11. Add entry in progress notes using the smart phrase ".MEWS".  Green - Likely stable Lavender - Comfort Care Only  1. Continue routine/ordered monitoring.  2. Review goals of care. 1. Continue routine/ordered monitoring. 2. Review goals of care.

## 2019-09-19 NOTE — Plan of Care (Signed)
  Problem: Clinical Measurements: Goal: Ability to maintain clinical measurements within normal limits will improve Outcome: Progressing   Problem: Activity: Goal: Risk for activity intolerance will decrease Outcome: Progressing   Problem: Nutrition: Goal: Adequate nutrition will be maintained Outcome: Progressing   Problem: Clinical Measurements: Goal: Ability to maintain clinical measurements within normal limits will improve Outcome: Progressing Goal: Will remain free from infection Outcome: Progressing Goal: Diagnostic test results will improve Outcome: Progressing   Problem: Activity: Goal: Risk for activity intolerance will decrease Outcome: Progressing   Problem: Nutrition: Goal: Adequate nutrition will be maintained Outcome: Progressing   Problem: Health Behavior/Discharge Planning: Goal: Ability to manage health-related needs will improve Outcome: Not Progressing Note: Confusion   Problem: Nutrition: Goal: Risk of aspiration will decrease Outcome: Progressing Goal: Dietary intake will improve Outcome: Progressing   Problem: Ischemic Stroke/TIA Tissue Perfusion: Goal: Complications of ischemic stroke/TIA will be minimized Outcome: Progressing

## 2019-09-20 DIAGNOSIS — M6282 Rhabdomyolysis: Secondary | ICD-10-CM | POA: Diagnosis not present

## 2019-09-20 DIAGNOSIS — G9341 Metabolic encephalopathy: Secondary | ICD-10-CM | POA: Diagnosis not present

## 2019-09-20 DIAGNOSIS — I10 Essential (primary) hypertension: Secondary | ICD-10-CM | POA: Diagnosis not present

## 2019-09-20 NOTE — Progress Notes (Signed)
[facility] PROGRESS NOTE  Tyler [name] [id number]:[id number] DOB: Apr 18, 1971 DOA: 07/23/2019 PCP: Patient, No Pcp Per  Brief summary   48 year old male with history of polysubstance abuse including heroine presented to [facility] ER after being found down with a syringe nearby.  He was given Narcan with minimal improvement.  On presentation, he was found to have severe rhabdomyolysis with acute kidney injury and hyperkalemia.  He was started on IV fluids including bicarb drip and transferred to [facility] for further care.  Rhabdomyolysis improved but he had persistence of encephalopathy and was found to have acute bilateral cerebellar infarct.  Neurology was consulted and has subsequently signed off.  PT recommended SNF placement.  Prolonged hospitalization due to placement issues.  Patient is medically stable for discharge to a skilled nursing facility as soon as bed is available.  Assessment/Plan:  Acute bilateral CVA, nonhemorrhagic. MRI of the brain showed acute CVA. Neurology evaluated the patient and has signed off.  MRA of the head negative. Ultrasound carotid showed bilateral 1 to 39% ICA stenosis. LDL 83. A1c 5.1. Echo showed EF of 60 to 65%.  Lower extremity Dopplers: Negative for DVT -Neurology recommended considering 30-day cardiac event monitoring as outpatient to rule out A. fib if patient neurologically improves. Not on statin due to elevated LFTs -PT recommended SNF.  Awaiting SNF placement.  Acute metabolic encephalopathy. Improved. Multifactorial: Most likely secondary CVA, drug use, uremia, heavy alcohol use -CT of the head and neck was initially negative for any traumatic finding -UDS, Tylenol and salicylate levels were negative. -TSH, folate, B12 levels all within normal limits -EEG: Generalized diffuse slowing but no seizure activity -Had required intermittent restraints initially during the hospitalization. -Fall precautions - Awaiting SNF placement.Difficult placement.  Waxing and waning mental status.  Still not fully oriented  Severe rhabdomyolysis/AKI. Resolved with IV fluids.  Renal ultrasound negative.  Off IV fluids  Transaminitis with elevated bilirubin. Probably from alcohol abuse -Right upper quadrant ultrasound showed hepatic steatosis with early cirrhosis with some gallbladder thickening -Monitor intermittently  Alcohol abuse -Advised to quit.  Continue thiamine, multivitamin and folic acid  UTI. Present on admission.  Completed 5 days of Rocephin  Essential hypertension. Stable.  Continue metoprolol.  Severe malnutrition. cont nutrition support   Dispo: awaiting SNF  Code Status: full Family Communication: d/w patient, RN (indicate person spoken with, relationship, and if by phone, the number) Disposition Plan: awaiting SNF   Consultants:  Neurology   Procedures:  Echo   Antibiotics: Anti-infectives (From admission, onward)   Start     Dose/Rate Route Frequency Ordered Stop   07/25/19 0745  cefTRIAXone (ROCEPHIN) 1 g in sodium chloride 0.9 % 100 mL IVPB     1 g 200 mL/hr over 30 Minutes Intravenous Every 24 hours 07/25/19 0734 07/29/19 1518       (indicate start date, and stop date if known)  HPI/Subjective: No acute issues overnight. No distress. He reports feeling better. No new focal neuro symptoms   Objective: Vitals:   09/19/19 2110 09/20/19 0506  BP: (!) 119/99 104/72  Pulse: 96 63  Resp: 18 18  Temp: 97.7 F (36.5 C) (!) 97.5 F (36.4 C)  SpO2: 97% 99%    Intake/Output Summary (Last 24 hours) at 09/20/2019 0817 Last data filed at 09/19/2019 1700 Gross per 24 hour  Intake 840 ml  Output -  Net 840 ml   Filed Weights   09/17/19 0514 09/18/19 0414 09/19/19 0500  Weight: 67 kg 67 kg 65 kg  Exam:   General:  No distress. No change in exam   Cardiovascular: s1,s2 rrr  Respiratory: CTA BL  Abdomen: soft, nt   Musculoskeletal: no leg edema    Data Reviewed: Basic Metabolic  Panel: Recent Labs  Lab 09/19/19 0347  NA 139  K 4.3  CL 106  CO2 21*  GLUCOSE 89  BUN 28*  CREATININE 1.30*  CALCIUM 9.0  MG 2.0   Liver Function Tests: Recent Labs  Lab 09/19/19 0347  AST 71*  ALT 101*  ALKPHOS 73  BILITOT 0.7  PROT 6.7  ALBUMIN 3.1*   No results for input(s): LIPASE, AMYLASE in the last 168 hours. No results for input(s): AMMONIA in the last 168 hours. CBC: Recent Labs  Lab 09/19/19 0347  WBC 8.1  NEUTROABS 4.0  HGB 12.5*  HCT 37.6*  MCV 95.4  PLT 260   Cardiac Enzymes: No results for input(s): CKTOTAL, CKMB, CKMBINDEX, TROPONINI in the last 168 hours. BNP (last 3 results) No results for input(s): BNP in the last 8760 hours.  ProBNP (last 3 results) No results for input(s): PROBNP in the last 8760 hours.  CBG: Recent Labs  Lab 09/18/19 1218 09/18/19 1611 09/18/19 1752 09/18/19 2109 09/19/19 0811  GLUCAP 89 141* 93 121* 80    No results found for this or any previous visit (from the past 240 hour(s)).   Studies: No results found.  Scheduled Meds: .  stroke: mapping our early stages of recovery book   Does not apply Once  . aspirin  325 mg Oral Daily  . enoxaparin (LOVENOX) injection  40 mg Subcutaneous Q24H  . feeding supplement (ENSURE ENLIVE)  237 mL Oral BID BM  . folic acid  1 mg Oral Daily  . lactulose  20 g Oral BID  . metoprolol tartrate  25 mg Oral BID  . multivitamin with minerals  1 tablet Oral Daily  . thiamine  100 mg Oral Daily   Or  . thiamine  100 mg Intravenous Daily   Continuous Infusions:  Principal Problem:   Rhabdomyolysis Active Problems:   Acute kidney failure (HCC)   Acute metabolic encephalopathy   Polysubstance abuse (HCC)   Hyperkalemia   Transaminitis   HTN (hypertension)   Cerebral embolism with cerebral infarction   Protein-calorie malnutrition, severe    Time spent: >25 minutes     Esperanza Sheets  [facility] Pager [phone]. If 7PM-7AM, please contact  night-coverage at [email or web address], password [facility] 09/20/2019, 8:17 AM  LOS: 59 days

## 2019-09-21 DIAGNOSIS — N179 Acute kidney failure, unspecified: Secondary | ICD-10-CM | POA: Diagnosis not present

## 2019-09-21 DIAGNOSIS — M6282 Rhabdomyolysis: Secondary | ICD-10-CM | POA: Diagnosis not present

## 2019-09-21 DIAGNOSIS — R7401 Elevation of levels of liver transaminase levels: Secondary | ICD-10-CM

## 2019-09-21 DIAGNOSIS — G9341 Metabolic encephalopathy: Secondary | ICD-10-CM | POA: Diagnosis not present

## 2019-09-21 DIAGNOSIS — I10 Essential (primary) hypertension: Secondary | ICD-10-CM | POA: Diagnosis not present

## 2019-09-21 NOTE — Progress Notes (Addendum)
PROGRESS NOTE  Tyler [name] WUJ:[id number] DOB: May 26, 1971 DOA: 07/23/2019 PCP: Patient, No Pcp Per  Brief Narrative: 48 year old male with history of polysubstance abuse including heroine presented to [facility] ER after being found down on the floor with a syringe nearby he was given Narcan with minimal improvement he was found to have severe rhabdomyolysis with acute kidney injury most likely secondary to the rhabdomyolysis and hyperkalemia secondary to rhabdomyolysis he was started on IV fluids including bicarb drip and transferred to [facility] for further care.  His rhabdomyolysis improved but he had persistent encephalopathy and was found to have acute bilateral cerebral infarct neurology was consulted and his has subsequently signed off PT rec was consulted who has been working with patient and recommended SNF placement.  He is currently waiting for bedProlonged hospitalization due to placement issues. Patient is medically stable for discharge to a skilled nursing facility as soon as bed is available. Barrier to discharge is lack of insurance for his SNF    Interval history/Subjective: Patient seen and examined at bedside he stated that he feels well denies any complaints today  Assessment/Plan Polysubstance abuse.  Including alcohol and drug use patient has been advised to quit using we will continue his thiamine multivitamin and folic acid   Acute bilateral CVA nonhemorrhagic patient is waiting for inpatient rehab at SNF.  Patient has been getting physical therapy in-house   Acute metabolic encephalopathy that has improved likely secondary to CVA    Essential hypertension stable continue metoprolol    Severe malnutrition continue nutrition support drug use and uremia  DVT prophylaxis: Subcutaneous Lovenox Code Status: Full Family Communication: None at bedside Disposition Plan: Awaiting acceptance at SNF. Barrier to discharge is lack of insurance for SNF discharge Time  spent 25 minutes  Dr. Kyung Bacca [facility] Pager [phone]  09/21/2019, 11:01 AM  LOS: 60 days   Consultants:  None  Procedures:  None  Antimicrobials:  Completed 5 days of Rocephin   Objective: Vitals:  Vitals:   09/21/19 0440 09/21/19 1034  BP: 107/75 (!) 114/94  Pulse: 64 84  Resp: 18   Temp: 97.7 F (36.5 C) 98.4 F (36.9 C)  SpO2: 100% 100%    Exam:  Constitutional:  . Appears calm and comfortable malnourished Eyes:  . pupils and irises appear normal . Normal lids and conjunctivae ENMT:  . grossly normal hearing  . Lips appear normal . external ears, nose appear normal . Oropharynx: mucosa, tongue,posterior pharynx appear normal Neck:  . neck appears normal, no masses, normal ROM, supple . no thyromegaly Respiratory:  . CTA bilaterally, no w/r/r.  . Respiratory effort normal. No retractions or accessory muscle use Cardiovascular:  . RRR, no m/r/g . No LE extremity edema   . Normal pedal pulses Abdomen:  . Abdomen appears normal; no tenderness or masses . No hernias . No HSM Musculoskeletal:  . Digits/nails BUE: no clubbing, cyanosis, petechiae, infection . exam of joints, bones, muscles of at least one of following: head/neck, RUE, LUE, RLE, LLE   o strength and tone normal, no atrophy, no abnormal movements o No tenderness, masses o Normal ROM, no contractures  . gait and station Skin:  . No rashes, lesions, ulcers . palpation of skin: no induration or nodules Neurologic:  . CN 2-12 intact . Sensation all 4 extremities intact Psychiatric:  . Mental status o Mood, affect appropriate pleasant o Orientation to person, place, time  . judgment and insight appear intact   I have personally reviewed the following:  Data: .   Scheduled Meds: .  stroke: mapping our early stages of recovery book   Does not apply Once  . aspirin  325 mg Oral Daily  . enoxaparin (LOVENOX) injection  40 mg Subcutaneous Q24H  . feeding supplement  (ENSURE ENLIVE)  237 mL Oral BID BM  . folic acid  1 mg Oral Daily  . lactulose  20 g Oral BID  . metoprolol tartrate  25 mg Oral BID  . multivitamin with minerals  1 tablet Oral Daily  . thiamine  100 mg Oral Daily   Or  . thiamine  100 mg Intravenous Daily   Continuous Infusions:  Principal Problem:   Rhabdomyolysis Active Problems:   Acute kidney failure (HCC)   Acute metabolic encephalopathy   Polysubstance abuse (HCC)   Hyperkalemia   Transaminitis   HTN (hypertension)   Cerebral embolism with cerebral infarction   Protein-calorie malnutrition, severe   LOS: 60 days

## 2019-09-21 NOTE — TOC Progression Note (Signed)
Transition of Care [facility]) - Progression Note    Patient Details  Name: Tyler [name] MRN: [id number] Date of Birth: 13-Dec-1970  Transition of Care [facility]) CM/SW Contact  Sharin Mons, RN Phone Number: [phone] 09/21/2019, 10:23 AM  Clinical Narrative:    Updated notes/ clinicals sent to [facility] for ? SNF placement , requested by liaison Gerald Stabs.  NCM also spoke with pt's daughter regarding no bed offers for pt. NCM shared update on pt condition and ask if there's someone in the family that can assist with caring for pt. Daughter stated she cant, works 2 jobs, has 4 children , her " old man works", 86 y/o brother irresponsible .. is never home. States she will reach out to extended family , update them on pt's current condition, solicit help and f/u with NCM .  NCM will continue to monitor for needs...   Expected Discharge Plan: Skilled Nursing Facility Barriers to Discharge: SNF Pending Medicaid, SNF Pending payor source - LOG, Active Substance Use - Placement  Expected Discharge Plan and Services Expected Discharge Plan: [place]   Discharge Planning Services: CM Consult, Other - See comment(Pt will need LOG for SNF placement) Post Acute Care Choice: [place] arrangements for the past 2 months: Single Family Home                 DME Arranged: N/A           HH Agency: NA         Social Determinants of Health (SDOH) Interventions    Readmission Risk Interventions No flowsheet data found.

## 2019-09-22 DIAGNOSIS — I10 Essential (primary) hypertension: Secondary | ICD-10-CM | POA: Diagnosis not present

## 2019-09-22 DIAGNOSIS — N170 Acute kidney failure with tubular necrosis: Secondary | ICD-10-CM | POA: Diagnosis not present

## 2019-09-22 DIAGNOSIS — E875 Hyperkalemia: Secondary | ICD-10-CM | POA: Diagnosis not present

## 2019-09-22 DIAGNOSIS — M6282 Rhabdomyolysis: Secondary | ICD-10-CM | POA: Diagnosis not present

## 2019-09-22 NOTE — Progress Notes (Signed)
PROGRESS NOTE  Tyler [name] MVH:[id number] DOB: 03/27/1971 DOA: 07/23/2019 PCP: Patient, No Pcp Per  Brief Narrative: 48 year old male with history of polysubstance abuse including heroine presented to [facility] ER after being found down on the floor with a syringe nearby he was given Narcan with minimal improvement he was found to have severe rhabdomyolysis with acute kidney injury most likely secondary to the rhabdomyolysis and hyperkalemia secondary to rhabdomyolysis he was started on IV fluids including bicarb drip and transferred to [facility] for further care.  His rhabdomyolysis improved but he had persistent encephalopathy and was found to have acute bilateral cerebral infarct neurology was consulted and his has subsequently signed off PT rec was consulted who has been working with patient and recommended SNF placement.  He is currently waiting for bedProlonged hospitalization due to placement issues. Patient is medically stable for discharge to a skilled nursing facility as soon as bed is available. Barrier to discharge is lack of insurance for his SNF    Interval history/Subjective: Patient seen and examined at bedside he stated that he feels well denies any complaints today  Update September 22, 2019:     Assessment/Plan Polysubstance abuse.  Including alcohol and drug use patient has been advised to quit using we will continue his thiamine multivitamin and folic acid   Acute bilateral CVA nonhemorrhagic patient is waiting for inpatient rehab at SNF.  Patient has been getting physical therapy in-house   Acute metabolic encephalopathy that has improved likely secondary to CVA    Essential hypertension stable continue metoprolol    Severe malnutrition continue nutrition support drug use and uremia  DVT prophylaxis: Subcutaneous Lovenox Code Status: Full Family Communication: None at bedside Disposition Plan: Awaiting acceptance at SNF. Barrier to discharge is lack of  insurance for SNF discharge Time spent 15 minutes  Dr. Kyung Bacca [facility] Pager [phone]  09/22/2019, 7:26 PM  LOS: 61 days   Consultants:  None  Procedures:  None  Antimicrobials:  Completed 5 days of Rocephin   Objective: Vitals:  Vitals:   09/22/19 0914 09/22/19 1253  BP: 108/81 101/74  Pulse: 64 70  Resp:  17  Temp:  98.5 F (36.9 C)  SpO2:  99%    Exam:  Constitutional:  . Appears calm and comfortable malnourished Eyes:  . pupils and irises appear normal . Normal lids and conjunctivae ENMT:  . grossly normal hearing  . Lips appear normal . external ears, nose appear normal . Oropharynx: mucosa, tongue,posterior pharynx appear normal Neck:  . neck appears normal, no masses, normal ROM, supple . no thyromegaly Respiratory:  . CTA bilaterally, no w/r/r.  . Respiratory effort normal. No retractions or accessory muscle use Cardiovascular:  . RRR, no m/r/g . No LE extremity edema   . Normal pedal pulses Abdomen:  . Abdomen appears normal; no tenderness or masses . No hernias . No HSM Musculoskeletal:  . Digits/nails BUE: no clubbing, cyanosis, petechiae, infection . exam of joints, bones, muscles of at least one of following: head/neck, RUE, LUE, RLE, LLE   o strength and tone normal, no atrophy, no abnormal movements o No tenderness, masses o Normal ROM, no contractures  . gait and station Skin:  . No rashes, lesions, ulcers . palpation of skin: no induration or nodules Neurologic:  . CN 2-12 intact . Sensation all 4 extremities intact Psychiatric:  . Mental status o Mood, affect appropriate pleasant o Orientation to person, place, time  . judgment and insight appear intact   I have  personally reviewed the following:   Data: .   Scheduled Meds: .  stroke: mapping our early stages of recovery book   Does not apply Once  . aspirin  325 mg Oral Daily  . enoxaparin (LOVENOX) injection  40 mg Subcutaneous Q24H  . feeding  supplement (ENSURE ENLIVE)  237 mL Oral BID BM  . folic acid  1 mg Oral Daily  . lactulose  20 g Oral BID  . metoprolol tartrate  25 mg Oral BID  . multivitamin with minerals  1 tablet Oral Daily  . thiamine  100 mg Oral Daily   Or  . thiamine  100 mg Intravenous Daily   Continuous Infusions:  Principal Problem:   Rhabdomyolysis Active Problems:   Acute kidney failure (HCC)   Acute metabolic encephalopathy   Polysubstance abuse (HCC)   Hyperkalemia   Transaminitis   HTN (hypertension)   Cerebral embolism with cerebral infarction   Protein-calorie malnutrition, severe   LOS: 61 days

## 2019-09-23 DIAGNOSIS — I10 Essential (primary) hypertension: Secondary | ICD-10-CM | POA: Diagnosis not present

## 2019-09-23 NOTE — Progress Notes (Signed)
PROGRESS NOTE    Tyler [name]  TMA:[id number] DOB: 1970-12-23 DOA: 07/23/2019 PCP: Patient, No Pcp Per      Brief Narrative:  [name] is a 48 year old male with history of polysubstance abuse including heroine presented to [facility] ER after being found down with a syringe nearby. He was given Narcan with minimal improvement. On presentation, he was found to have severe rhabdomyolysis with acute kidney injury and hyperkalemia. He was started on IV fluids including bicarb drip and transferred to [facility] for further care.        Assessment & Plan:  Acute bilateral CVA -Continue aspirin -Statin held due to transaminitis   Acute metabolic encephalopathy See summary by Dr. Daleen [name] from 10/14  Hypertension BP controlled -Continue metoprolol   CKD stage III       MDM and disposition: The below labs and imaging reports were reviewed and summarized above.  Medication management as above.  The patient was admitted with altered mentation, found to have bilateral cerebral infarcts. While completely independent at baseline, he is currently disoriented and dependent for all self cares.  Efforts for safe placement will proceed.       DVT prophylaxis: Loveno Code Status: FULL Family Communication:     Consultants:   Neurology  Procedures:   8/19 renal US  8/19 MRI brain  8/20 US abdomen  8/27 MRA head    Subjective: No complaints.  No headache, chest pain, dyspnea.  No nursing complaints, no fever, respiratory distress.  Still confused.  Objective: Vitals:   09/22/19 1253 09/22/19 2153 09/23/19 0513 09/23/19 0838  BP: 101/74 95/70 97/71  105/85  Pulse: 70 74 69 65  Resp: 17 18 18    Temp: 98.5 F (36.9 C) 97.9 F (36.6 C) 98 F (36.7 C)   TempSrc: Oral Oral Oral   SpO2: 99% 99% 99%   Weight:   64.6 kg   Height:        Intake/Output Summary (Last 24 hours) at 09/23/2019 1221 Last data filed at 09/23/2019 0900 Gross per 24 hour  Intake  720 ml  Output -  Net 720 ml   Filed Weights   09/19/19 0500 09/21/19 0442 09/23/19 0513  Weight: 65 kg 67.1 kg 64.6 kg    Examination: General appearance:  adult male, alert to my presence and in no acute distress.   HEENT: Anicteric, conjunctiva pink, lids and lashes normal. No nasal deformity, discharge, epistaxis.  Lips moist, dentition poor, op moist, no oral lesions, hearing normal.   Skin: Warm and dry.  no jaundice.  No suspicious rashes or lesions. Cardiac: RRR, nl S1-S2, no murmurs appreciated.  Capillary refill is brisk.  JVP normal.  No LE edema.  Radia  pulses 2+ and symmetric. Respiratory: Normal respiratory rate and rhythm.  CTAB without rales or wheezes. Abdomen: Abdomen soft.  no TTP. No ascites, distension, hepatosplenomegaly.   MSK: No deformities or effusions. Neuro: Awake and alert, oriented to self only, not place or year.  EOMI, moves all extremities. Speech fluent.    Psych: Sensorium intact and responding to questions, attention diminished. Affect pleasant.  Judgment and insight appear severely impaired.    Data Reviewed: I have personally reviewed following labs and imaging studies:  CBC: Recent Labs  Lab 09/19/19 0347  WBC 8.1  NEUTROABS 4.0  HGB 12.5*  HCT 37.6*  MCV 95.4  PLT 256   Basic Metabolic Panel: Recent Labs  Lab 09/19/19 0347  NA 139  K 4.3  CL 106  CO2  21*  GLUCOSE 89  BUN 28*  CREATININE 1.30*  CALCIUM 9.0  MG 2.0   GFR: Estimated Creatinine Clearance: 62.1 mL/min (A) (by C-G formula based on SCr of 1.3 mg/dL (H)). Liver Function Tests: Recent Labs  Lab 09/19/19 0347  AST 71*  ALT 101*  ALKPHOS 73  BILITOT 0.7  PROT 6.7  ALBUMIN 3.1*   No results for input(s): LIPASE, AMYLASE in the last 168 hours. No results for input(s): AMMONIA in the last 168 hours. Coagulation Profile: No results for input(s): INR, PROTIME in the last 168 hours. Cardiac Enzymes: No results for input(s): CKTOTAL, CKMB, CKMBINDEX, TROPONINI  in the last 168 hours. BNP (last 3 results) No results for input(s): PROBNP in the last 8760 hours. HbA1C: No results for input(s): HGBA1C in the last 72 hours. CBG: Recent Labs  Lab 09/18/19 1218 09/18/19 1611 09/18/19 1752 09/18/19 2109 09/19/19 0811  GLUCAP 89 141* 93 121* 80   Lipid Profile: No results for input(s): CHOL, HDL, LDLCALC, TRIG, CHOLHDL, LDLDIRECT in the last 72 hours. Thyroid Function Tests: No results for input(s): TSH, T4TOTAL, FREET4, T3FREE, THYROIDAB in the last 72 hours. Anemia Panel: No results for input(s): VITAMINB12, FOLATE, FERRITIN, TIBC, IRON, RETICCTPCT in the last 72 hours. Urine analysis:    Component Value Date/Time   COLORURINE AMBER (A) 07/23/2019 2214   APPEARANCEUR CLOUDY (A) 07/23/2019 2214   LABSPEC 1.012 07/23/2019 2214   PHURINE 5.0 07/23/2019 2214   GLUCOSEU 50 (A) 07/23/2019 2214   HGBUR LARGE (A) 07/23/2019 2214   BILIRUBINUR NEGATIVE 07/23/2019 2214   KETONESUR NEGATIVE 07/23/2019 2214   PROTEINUR 30 (A) 07/23/2019 2214   NITRITE NEGATIVE 07/23/2019 2214   LEUKOCYTESUR SMALL (A) 07/23/2019 2214   Sepsis Labs: @LABRCNTIP (procalcitonin:4,lacticacidven:4)  )No results found for this or any previous visit (from the past 240 hour(s)).       Radiology Studies: No results found.      Scheduled Meds: .  stroke: mapping our early stages of recovery book   Does not apply Once  . aspirin  325 mg Oral Daily  . enoxaparin (LOVENOX) injection  40 mg Subcutaneous Q24H  . feeding supplement (ENSURE ENLIVE)  237 mL Oral BID BM  . folic acid  1 mg Oral Daily  . lactulose  20 g Oral BID  . metoprolol tartrate  25 mg Oral BID  . multivitamin with minerals  1 tablet Oral Daily  . thiamine  100 mg Oral Daily   Or  . thiamine  100 mg Intravenous Daily   Continuous Infusions:   LOS: 62 days    Time spent: 25 minutes    , MD [facility] 09/23/2019, 12:21 PM     Please page through AMION:   [email or web address] Password [email or web address] If 7PM-7AM, please contact night-coverage

## 2019-09-24 DIAGNOSIS — N170 Acute kidney failure with tubular necrosis: Secondary | ICD-10-CM | POA: Diagnosis not present

## 2019-09-24 DIAGNOSIS — G9341 Metabolic encephalopathy: Secondary | ICD-10-CM | POA: Diagnosis not present

## 2019-09-24 DIAGNOSIS — M6282 Rhabdomyolysis: Secondary | ICD-10-CM | POA: Diagnosis not present

## 2019-09-24 DIAGNOSIS — I6340 Cerebral infarction due to embolism of unspecified cerebral artery: Secondary | ICD-10-CM | POA: Diagnosis not present

## 2019-09-24 NOTE — Progress Notes (Signed)
Physical Therapy Treatment Patient Details Name: Tyler [name] MRN: [id number] DOB: 24-Jun-1971 Today's Date: 09/24/2019    History of Present Illness 48 y/o male transferred from [place] hospital for unresponsiveness, likely secondary to substance abuse. Pt with rhabdomyolysis and AKI. EEG revealed moderate diffuse encephalopathy. PMH includes polysubstance abuse and HTN. Imaging show acute bialteral cerebellar infarcts.     PT Comments    Patient received in bed, agrees to walk. Not oriented to place, situation. Impulsive with mobility, decreased awareness of environment. Patient is independent with bed mobility, requires min guard for transfers and ambulation. He gets easily disoriented out in hallway and needs constant verbal/tactile cues for direction and how to get back to his room. Patient will continue to benefit from skilled PT to improve safety with mobility.    Follow Up Recommendations  Supervision/Assistance - 24 hour;SNF     Equipment Recommendations  None recommended by PT    Recommendations for Other Services       Precautions / Restrictions Precautions Precautions: Fall Precaution Comments: poor safety awareness Restrictions Weight Bearing Restrictions: No    Mobility  Bed Mobility Overal bed mobility: Independent Bed Mobility: Supine to Sit           General bed mobility comments: got to edge of bed, donned socks without assist.  Transfers Overall transfer level: Needs assistance Equipment used: None Transfers: Sit to/from Stand Sit to Stand: Min guard         General transfer comment: sup/min guard safety  Ambulation/Gait Ambulation/Gait assistance: Min guard Gait Distance (Feet): 400 Feet Assistive device: None Gait Pattern/deviations: Step-through pattern;Drifts right/left Gait velocity: WNL   General Gait Details: close supervision, tactile and verbal cues for obstacle avoidance in hallway, Unaware of environment-easily confused,  disoriented.   Stairs             Wheelchair Mobility    Modified Rankin (Stroke Patients Only) Modified Rankin (Stroke Patients Only) Pre-Morbid Rankin Score: No symptoms Modified Rankin: Moderately severe disability     Balance Overall balance assessment: Needs assistance Sitting-balance support: Feet supported Sitting balance-Leahy Scale: Normal Sitting balance - Comments: able to sit unsupport with no LOB   Standing balance support: During functional activity;No upper extremity supported Standing balance-Leahy Scale: Fair Standing balance comment: poor awareness of environment                            Cognition Arousal/Alertness: Awake/alert Behavior During Therapy: WFL for tasks assessed/performed;Impulsive Overall Cognitive Status: Impaired/Different from baseline Area of Impairment: Orientation;Awareness;Problem solving;Memory;Following commands;Safety/judgement;Attention                 Orientation Level: Disoriented to;Place;Time;Situation Current Attention Level: Selective Memory: Decreased short-term memory Following Commands: Follows one step commands inconsistently;Follows one step commands with increased time Safety/Judgement: Decreased awareness of safety;Decreased awareness of deficits Awareness: Intellectual Problem Solving: Slow processing;Requires verbal cues;Requires tactile cues General Comments: continues to be oriented only to self, unable to find his way back to his room, could not read room signs in hallway, trying to go into other rooms that are not his.      Exercises      General Comments        Pertinent Vitals/Pain Pain Assessment: No/denies pain    Home Living                      Prior Function  PT Goals (current goals can now be found in the care plan section) Acute Rehab PT Goals Patient Stated Goal: not stated PT Goal Formulation: Patient unable to participate in goal  setting Time For Goal Achievement: 10/05/19 Potential to Achieve Goals: Fair Progress towards PT goals: Progressing toward goals    Frequency    Min 2X/week      PT Plan Current plan remains appropriate    Co-evaluation              AM-PAC PT "6 Clicks" Mobility   Outcome Measure  Help needed turning from your back to your side while in a flat bed without using bedrails?: None Help needed moving from lying on your back to sitting on the side of a flat bed without using bedrails?: None Help needed moving to and from a bed to a chair (including a wheelchair)?: None Help needed standing up from a chair using your arms (e.g., wheelchair or bedside chair)?: None Help needed to walk in hospital room?: A Little Help needed climbing 3-5 steps with a railing? : A Little 6 Click Score: 22    End of Session Equipment Utilized During Treatment: Gait belt Activity Tolerance: Patient tolerated treatment well Patient left: in chair;with chair alarm set;with call bell/phone within reach Nurse Communication: Mobility status PT Visit Diagnosis: Unsteadiness on feet (R26.81);Difficulty in walking, not elsewhere classified (R26.2);Other symptoms and signs involving the nervous system (R29.898)     Time: 1320-1330 PT Time Calculation (min) (ACUTE ONLY): 10 min  Charges:  $Gait Training: 8-22 mins                     Brynnly Bonet, PT, GCS 09/24/19,1:44 PM

## 2019-09-24 NOTE — Progress Notes (Signed)
PROGRESS NOTE    Emmanuelle Coxe  HKV:[id number]  DOB: 19-Nov-1971  DOA: 07/23/2019 PCP: Patient, No Pcp Per  Brief Narrative:  48 year old male with history of polysubstance abuse including heroine presented to [facility] ER after being found down with a syringe nearby. He was given Narcan x2  with minimal improvement. On presentation, he was found to have severe rhabdomyolysis (cpk 72000), AST 2000, ALT 1000 with acute kidney injury (BUN 40, creat 2.4)and hyperkalemia (potassium 6.2) He was started on IV fluids including bicarb drip and transferred to [facility] for further care.Initial leukocytosis of 21k, though some of this thought to be due to hemoconcentration given HGB > 19.UDS neg, acetominophen and salycilate negative. CT head and neck were negative for acute traumatic findings. Patient admitted to Hospitalist service for rhabdo/AKI with bicarb drip. Rhabdomyolysis improved but he had persistence of encephalopathy and was found to have acute bilateral cerebellar infarct. Neurology was consulted and has subsequently signed off. PT recommended SNF placement. Prolonged hospitalization due to placement issues. Patient is medically stable for discharge to a skilled nursing facility as soon as bed is available   Subjective:  Patient resting comfortably. No acute c/o. Oriented to person and time, not to place.   Objective: Vitals:   09/23/19 1254 09/23/19 2103 09/24/19 0520 09/24/19 0833  BP: 102/90 108/70 106/73 (!) 133/91  Pulse: 75 81 74 61  Resp: 16 16 16    Temp: 97.7 F (36.5 C) 98.6 F (37 C) 98.1 F (36.7 C)   TempSrc: Oral Oral Oral   SpO2: 99% 96% 100%   Weight:   66.1 kg   Height:        Intake/Output Summary (Last 24 hours) at 09/24/2019 0837 Last data filed at 09/24/2019 0055 Gross per 24 hour  Intake 720 ml  Output 1 ml  Net 719 ml   Filed Weights   09/21/19 0442 09/23/19 0513 09/24/19 0520  Weight: 67.1 kg 64.6 kg 66.1 kg    Physical Examination:   General exam: Appears calm and comfortable  Respiratory system: Clear to auscultation. Respiratory effort normal. Cardiovascular system: S1 & S2 heard, RRR. No JVD, murmurs, rubs, gallops or clicks. No pedal edema. Gastrointestinal system: Abdomen is nondistended, soft and nontender. No organomegaly or masses felt. Normal bowel sounds heard. Central nervous system: Alert and oriented x2. No focal neurological deficits. Extremities: Symmetric 5 x 5 power. Skin: No rashes, lesions or ulcers Psychiatry: Judgement and insight appear somewhat impaired. Mood & affect cheerful.     Data Reviewed: I have personally reviewed following labs and imaging studies  CBC: Recent Labs  Lab 09/19/19 0347  WBC 8.1  NEUTROABS 4.0  HGB 12.5*  HCT 37.6*  MCV 95.4  PLT 564   Basic Metabolic Panel: Recent Labs  Lab 09/19/19 0347  NA 139  K 4.3  CL 106  CO2 21*  GLUCOSE 89  BUN 28*  CREATININE 1.30*  CALCIUM 9.0  MG 2.0   GFR: Estimated Creatinine Clearance: 63.6 mL/min (A) (by C-G formula based on SCr of 1.3 mg/dL (H)). Liver Function Tests: Recent Labs  Lab 09/19/19 0347  AST 71*  ALT 101*  ALKPHOS 73  BILITOT 0.7  PROT 6.7  ALBUMIN 3.1*   No results for input(s): LIPASE, AMYLASE in the last 168 hours. No results for input(s): AMMONIA in the last 168 hours. Coagulation Profile: No results for input(s): INR, PROTIME in the last 168 hours. Cardiac Enzymes: No results for input(s): CKTOTAL, CKMB, CKMBINDEX, TROPONINI in the last 168  hours. BNP (last 3 results) No results for input(s): PROBNP in the last 8760 hours. HbA1C: No results for input(s): HGBA1C in the last 72 hours. CBG: Recent Labs  Lab 09/18/19 1218 09/18/19 1611 09/18/19 1752 09/18/19 2109 09/19/19 0811  GLUCAP 89 141* 93 121* 80   Lipid Profile: No results for input(s): CHOL, HDL, LDLCALC, TRIG, CHOLHDL, LDLDIRECT in the last 72 hours. Thyroid Function Tests: No results for input(s): TSH, T4TOTAL, FREET4,  T3FREE, THYROIDAB in the last 72 hours. Anemia Panel: No results for input(s): VITAMINB12, FOLATE, FERRITIN, TIBC, IRON, RETICCTPCT in the last 72 hours. Sepsis Labs: No results for input(s): PROCALCITON, LATICACIDVEN in the last 168 hours.  No results found for this or any previous visit (from the past 240 hour(s)).    Radiology Studies: No results found.      Scheduled Meds: .  stroke: mapping our early stages of recovery book   Does not apply Once  . aspirin  325 mg Oral Daily  . enoxaparin (LOVENOX) injection  40 mg Subcutaneous Q24H  . feeding supplement (ENSURE ENLIVE)  237 mL Oral BID BM  . folic acid  1 mg Oral Daily  . lactulose  20 g Oral BID  . metoprolol tartrate  25 mg Oral BID  . multivitamin with minerals  1 tablet Oral Daily  . thiamine  100 mg Oral Daily   Or  . thiamine  100 mg Intravenous Daily   Continuous Infusions:  Assessment & Plan:    1.Acute bilateral ischemic CVA.MRI of the brain showed acute CVA.Neurology evaluated, MRA of the head negative. Ultrasound carotid showed bilateral 1 to 39% ICA stenosis.LDL 83.A1c 5.1.Echo showed EF of 60 to 65%. Lower extremity Dopplers: Negative for DVT.Neurology recommended considering 30-day cardiac event monitoring as outpatient to rule out A. fib .Not on statin due to elevated LFTs  2.Acute metabolic encephalopathy. POA. Improved.Multifactorial: Most likely secondary to CVA in the setting of alcohol/ drug abuse.CT of the head and neck was initially negative for any significant findings.-UDS, Tylenol and salicylate levels were negative.TSH, folate, B12 levels all within normal limits.EEG: Generalized diffuse slowing but no seizure activity Had required intermittent restraints initially during the hospitalization.Now improvedwith waxing and waning mental status.Awaiting SNF placement  3. Severe rhabdomyolysis/AKI.Resolved with IV fluids. Renal ultrasound negative. Creat at 1.3 on 10/14. Repeat labs in am   4. Transaminitis with elevated bilirubin.Probably from alcohol abuse -Right upper quadrant ultrasound showed hepatic steatosis with early cirrhosis with some gallbladder thickening  5.Alcohol abuse-Advised to quit. Continue thiamine, multivitamin and folic acid  6. UTI.Present on admission. Completed 5 days of Rocephin  7. Essential hypertension.Stable.Continue metoprolol.  8. Severe malnutrition. contnutrition support   DVT prophylaxis: lovenox Code Status: Full code Family / Patient Communication: No family bedside Disposition Plan: Awaiting SNF placement     LOS: 63 days    Time spent: 25 minutes    Alessandra Bevels, MD [facility] Pager [phone]  If 7PM-7AM, please contact night-coverage [email or web address] Password [facility] 09/24/2019, 8:37 AM

## 2019-09-24 NOTE — Progress Notes (Signed)
Patient getting out of bed without assistance. Patient in hallway saying he was trying to find the door to leave. Patient boisterous and required significant redirection back to room.

## 2019-09-24 NOTE — Progress Notes (Signed)
   09/24/19 1517  Vitals  BP (!) 150/101  MAP (mmHg) 117   Dr. Earnest Conroy paged to notify.

## 2019-09-25 DIAGNOSIS — M6282 Rhabdomyolysis: Secondary | ICD-10-CM | POA: Diagnosis not present

## 2019-09-25 DIAGNOSIS — I6340 Cerebral infarction due to embolism of unspecified cerebral artery: Secondary | ICD-10-CM | POA: Diagnosis not present

## 2019-09-25 DIAGNOSIS — G9341 Metabolic encephalopathy: Secondary | ICD-10-CM | POA: Diagnosis not present

## 2019-09-25 DIAGNOSIS — N170 Acute kidney failure with tubular necrosis: Secondary | ICD-10-CM | POA: Diagnosis not present

## 2019-09-25 LAB — COMPREHENSIVE METABOLIC PANEL
ALT: 79 U/L — ABNORMAL HIGH (ref 0–44)
AST: 52 U/L — ABNORMAL HIGH (ref 15–41)
Albumin: 3.6 g/dL (ref 3.5–5.0)
Alkaline Phosphatase: 70 U/L (ref 38–126)
Anion gap: 10 (ref 5–15)
BUN: 21 mg/dL — ABNORMAL HIGH (ref 6–20)
CO2: 27 mmol/L (ref 22–32)
Calcium: 9.6 mg/dL (ref 8.9–10.3)
Chloride: 103 mmol/L (ref 98–111)
Creatinine, Ser: 1.18 mg/dL (ref 0.61–1.24)
GFR calc Af Amer: >60 mL/min (ref >60)
GFR calc non Af Amer: >60 mL/min (ref >60)
Glucose, Bld: 85 mg/dL (ref 70–99)
Potassium: 4.4 mmol/L (ref 3.5–5.1)
Sodium: 140 mmol/L (ref 135–145)
Total Bilirubin: 0.6 mg/dL (ref 0.3–1.2)
Total Protein: 7.7 g/dL (ref 6.5–8.1)

## 2019-09-25 LAB — CBC
HCT: 45.3 % (ref 39.0–52.0)
Hemoglobin: 15.4 g/dL (ref 13.0–17.0)
MCH: 32.3 pg (ref 26.0–34.0)
MCHC: 34 g/dL (ref 30.0–36.0)
MCV: 95 fL (ref 80.0–100.0)
Platelets: 262 10*3/uL (ref 150–400)
RBC: 4.77 MIL/uL (ref 4.22–5.81)
RDW: 13 % (ref 11.5–15.5)
WBC: 8 10*3/uL (ref 4.0–10.5)
nRBC: 0 % (ref 0.0–0.2)

## 2019-09-25 MED ORDER — OMEGA-3-ACID ETHYL ESTERS 1 G PO CAPS
1.0000 g | ORAL_CAPSULE | Freq: Every day | ORAL | Status: DC
  Administered 2019-09-25 – 2019-12-16 (×65): 1 g via ORAL
  Filled 2019-09-25 (×72): qty 1

## 2019-09-25 NOTE — [place] FL2 (Signed)
[place] MEDICAID FL2 LEVEL OF CARE SCREENING TOOL     IDENTIFICATION  Patient Name: Tyler [name] Birthdate: 06-01-1971 Sex: male Admission Date (Current Location): 07/23/2019  [facility] and [place] Number:  [employer] and Address:  [facility], [address], [place], [place] [address]      Provider Number: [id number]  Attending Physician Name and Address:  Alessandra Bevels, MD  Relative Name and Phone Number:  Jearld Lesch, daughter, [phone]    Current Level of Care: Hospital Recommended Level of Care: Assisted Living Facility Prior Approval Number:    Date Approved/Denied:   PASRR Number: [id number]  Discharge Plan: Other (Comment)(ALF)    Current Diagnoses: Patient Active Problem List   Diagnosis Date Noted  . Protein-calorie malnutrition, severe 08/09/2019  . Cerebral embolism with cerebral infarction 07/28/2019  . Rhabdomyolysis 07/23/2019  . Acute kidney failure (HCC) 07/23/2019  . Acute metabolic encephalopathy 07/23/2019  . Polysubstance abuse (HCC) 07/23/2019  . Hyperkalemia 07/23/2019  . Transaminitis 07/23/2019  . HTN (hypertension) 07/23/2019  . Fall 04/21/2012  . Traumatic subdural hematoma (HCC) 04/21/2012  . ICC 04/21/2012  . Left orbit fracture (HCC) 04/21/2012  . Left maxillary fracture (HCC) 04/21/2012  . Alcohol use 04/21/2012  . Cocaine use 04/21/2012    Orientation RESPIRATION BLADDER Height & Weight     Self, Place  Normal Continent, External catheter Weight: 141 lb 1.5 oz (64 kg) Height:  5\' 8"  (172.7 cm)  BEHAVIORAL SYMPTOMS/MOOD NEUROLOGICAL BOWEL NUTRITION STATUS  Wanderer   Incontinent Diet(See DC Summary)  AMBULATORY STATUS COMMUNICATION OF NEEDS Skin   Supervision Verbally Normal                       Personal Care Assistance Level of Assistance  Bathing, Feeding, Dressing Bathing Assistance: Limited assistance Feeding assistance: Independent Dressing Assistance: Limited  assistance     Functional Limitations Info  Sight, Hearing, Speech Sight Info: Impaired Hearing Info: Adequate Speech Info: Adequate    SPECIAL CARE FACTORS FREQUENCY  PT (By licensed PT), OT (By licensed OT)     PT Frequency: 3x OT Frequency: 2x            Contractures Contractures Info: Not present    Additional Factors Info  Code Status, Allergies, Insulin Sliding Scale Code Status Info: Full Allergies Info: NKA   Insulin Sliding Scale Info: See dc summary for dose       Current Medications (09/25/2019):  This is the current hospital active medication list Current Facility-Administered Medications  Medication Dose Route Frequency Provider Last Rate Last Dose  .  stroke: mapping our early stages of recovery book   Does not apply Once Amin, 09/27/2019, MD      . acetaminophen (TYLENOL) tablet 650 mg  650 mg Oral Q6H PRN Loura Halt, DO   650 mg at 09/19/19 2248   Or  . acetaminophen (TYLENOL) suppository 650 mg  650 mg Rectal Q6H PRN 2249, DO      . alum & mag hydroxide-simeth (MAALOX/MYLANTA) 200-200-20 MG/5ML suspension 30 mL  30 mL Oral Q4H PRN Amin, Ankit Chirag, MD      . aspirin tablet 325 mg  325 mg Oral Daily Amin, Ankit Chirag, MD   325 mg at 09/25/19 0914  . enoxaparin (LOVENOX) injection 40 mg  40 mg Subcutaneous Q24H 09/27/19, MD   40 mg at 09/25/19 1241  . feeding supplement (ENSURE ENLIVE) (ENSURE ENLIVE) liquid 237  mL  237 mL Oral BID BM Alekh, Kshitiz, MD   237 mL at 09/25/19 1331  . folic acid (FOLVITE) tablet 1 mg  1 mg Oral Daily Aline August, MD   1 mg at 09/25/19 0913  . guaiFENesin-dextromethorphan (ROBITUSSIN DM) 100-10 MG/5ML syrup 5 mL  5 mL Oral Q4H PRN Amin, Ankit Chirag, MD      . haloperidol lactate (HALDOL) injection 2-5 mg  2-5 mg Intravenous Q6H PRN Alekh, Kshitiz, MD       Or  . haloperidol lactate (HALDOL) injection 2-5 mg  2-5 mg Intramuscular Q6H PRN Alekh, Kshitiz, MD      . hydrALAZINE (APRESOLINE)  injection 10 mg  10 mg Intravenous Q6H PRN Bodenheimer, Charles A, NP   10 mg at 07/31/19 0414  . hydrocortisone (ANUSOL-HC) 2.5 % rectal cream 1 application  1 application Topical QID PRN Amin, Ankit Chirag, MD      . hydrocortisone cream 1 % 1 application  1 application Topical TID PRN Amin, Ankit Chirag, MD      . ipratropium-albuterol (DUONEB) 0.5-2.5 (3) MG/3ML nebulizer solution 3 mL  3 mL Nebulization Q4H PRN Amin, Ankit Chirag, MD      . lactulose (CHRONULAC) 10 GM/15ML solution 20 g  20 g Oral BID Amin, Ankit Chirag, MD   20 g at 09/25/19 0914  . metoprolol tartrate (LOPRESSOR) tablet 25 mg  25 mg Oral BID Gardiner Barefoot, NP   25 mg at 09/25/19 9323  . multivitamin with minerals tablet 1 tablet  1 tablet Oral Daily Alma Friendly, MD   1 tablet at 09/25/19 0914  . Muscle Rub CREA 1 application  1 application Topical PRN Amin, Ankit Chirag, MD      . omega-3 acid ethyl esters (LOVAZA) capsule 1 g  1 g Oral Daily Guilford Shi, MD   1 g at 09/25/19 1241  . ondansetron (ZOFRAN) tablet 4 mg  4 mg Oral Q6H PRN Etta Quill, DO   4 mg at 09/13/19 2113   Or  . ondansetron (ZOFRAN) injection 4 mg  4 mg Intravenous Q6H PRN Etta Quill, DO      . phenol (CHLORASEPTIC) mouth spray 1 spray  1 spray Mouth/Throat PRN Amin, Ankit Chirag, MD      . polyethylene glycol (MIRALAX / GLYCOLAX) packet 17 g  17 g Oral Daily PRN Amin, Ankit Chirag, MD      . polyvinyl alcohol (LIQUIFILM TEARS) 1.4 % ophthalmic solution 1 drop  1 drop Both Eyes PRN Amin, Ankit Chirag, MD      . senna-docusate (Senokot-S) tablet 2 tablet  2 tablet Oral QHS PRN Amin, Ankit Chirag, MD      . sodium chloride (OCEAN) 0.65 % nasal spray 1 spray  1 spray Each Nare PRN Amin, Ankit Chirag, MD      . sodium chloride flush (NS) 0.9 % injection 10-40 mL  10-40 mL Intracatheter PRN Amin, Ankit Chirag, MD      . thiamine (VITAMIN B-1) tablet 100 mg  100 mg Oral Daily Alcario Drought, Jared M, DO   100 mg at 09/25/19 5573   Or   . thiamine (B-1) injection 100 mg  100 mg Intravenous Daily Jennette Kettle M, DO   100 mg at 08/02/19 1054     Discharge Medications: Please see discharge summary for a list of discharge medications.  Relevant Imaging Results:  Relevant Lab Results:   Additional Information SSN: [place], [place]

## 2019-09-25 NOTE — Progress Notes (Signed)
PROGRESS NOTE    Tyler [name]  XLK:[id number]  DOB: 29-Nov-1971  DOA: 07/23/2019 PCP: Patient, No Pcp Per  Brief Narrative:  48 year old male with history of polysubstance abuse including heroine presented to [facility] ER after being found down with a syringe nearby. He was given Narcan x2  with minimal improvement. On presentation, he was found to have severe rhabdomyolysis (cpk 72000), AST 2000, ALT 1000 with acute kidney injury (BUN 40, creat 2.4)and hyperkalemia (potassium 6.2) He was started on IV fluids including bicarb drip and transferred to [facility] for further care.Initial leukocytosis of 21k, though some of this thought to be due to hemoconcentration given HGB > 19.UDS neg, acetominophen and salycilate negative. CT head and neck were negative for acute traumatic findings. Patient admitted to Hospitalist service for rhabdo/AKI with bicarb drip. Rhabdomyolysis improved but he had persistence of encephalopathy and was found to have acute bilateral cerebellar infarct. Neurology was consulted and has subsequently signed off. PT recommended SNF placement. Prolonged hospitalization due to placement issues. Patient is medically stable for discharge to a skilled nursing facility as soon as bed is available   Subjective:  Patient resting comfortably. No acute issues overnight. Seen by PT and dietician for f/u.    Objective: Vitals:   09/24/19 2052 09/24/19 2144 09/25/19 0602 09/25/19 0606  BP: 127/84 114/70 105/77 105/77  Pulse:  70 68 68  Resp:  18 18 18   Temp:  98.3 F (36.8 C) 98.3 F (36.8 C) 98.3 F (36.8 C)  TempSrc:  Oral Oral Tympanic  SpO2:  99% 98% 98%  Weight:    64 kg  Height:        Intake/Output Summary (Last 24 hours) at 09/25/2019 1142 Last data filed at 09/25/2019 0856 Gross per 24 hour  Intake 480 ml  Output 2 ml  Net 478 ml   Filed Weights   09/23/19 0513 09/24/19 0520 09/25/19 0606  Weight: 64.6 kg 66.1 kg 64 kg    Physical Examination:   General exam: Appears calm and comfortable  Respiratory system: Clear to auscultation. Respiratory effort normal. Cardiovascular system: S1 & S2 heard, RRR. No JVD, murmurs, rubs, gallops or clicks. No pedal edema. Gastrointestinal system: Abdomen is nondistended, soft and nontender. No organomegaly or masses felt. Normal bowel sounds heard. Central nervous system: Alert and oriented x2. No focal neurological deficits. Extremities: Symmetric 5 x 5 power. Skin: No rashes, lesions or ulcers Psychiatry: Judgement and insight appear somewhat impaired. Mood & affect cheerful.     Data Reviewed: I have personally reviewed following labs and imaging studies  CBC: Recent Labs  Lab 09/19/19 0347 09/25/19 0825  WBC 8.1 8.0  NEUTROABS 4.0  --   HGB 12.5* 15.4  HCT 37.6* 45.3  MCV 95.4 95.0  PLT 260 366   Basic Metabolic Panel: Recent Labs  Lab 09/19/19 0347 09/25/19 0825  NA 139 140  K 4.3 4.4  CL 106 103  CO2 21* 27  GLUCOSE 89 85  BUN 28* 21*  CREATININE 1.30* 1.18  CALCIUM 9.0 9.6  MG 2.0  --    GFR: Estimated Creatinine Clearance: 67.8 mL/min (by C-G formula based on SCr of 1.18 mg/dL). Liver Function Tests: Recent Labs  Lab 09/19/19 0347 09/25/19 0825  AST 71* 52*  ALT 101* 79*  ALKPHOS 73 70  BILITOT 0.7 0.6  PROT 6.7 7.7  ALBUMIN 3.1* 3.6   No results for input(s): LIPASE, AMYLASE in the last 168 hours. No results for input(s): AMMONIA in the last  168 hours. Coagulation Profile: No results for input(s): INR, PROTIME in the last 168 hours. Cardiac Enzymes: No results for input(s): CKTOTAL, CKMB, CKMBINDEX, TROPONINI in the last 168 hours. BNP (last 3 results) No results for input(s): PROBNP in the last 8760 hours. HbA1C: No results for input(s): HGBA1C in the last 72 hours. CBG: Recent Labs  Lab 09/18/19 1218 09/18/19 1611 09/18/19 1752 09/18/19 2109 09/19/19 0811  GLUCAP 89 141* 93 121* 80   Lipid Profile: No results for input(s): CHOL, HDL,  LDLCALC, TRIG, CHOLHDL, LDLDIRECT in the last 72 hours. Thyroid Function Tests: No results for input(s): TSH, T4TOTAL, FREET4, T3FREE, THYROIDAB in the last 72 hours. Anemia Panel: No results for input(s): VITAMINB12, FOLATE, FERRITIN, TIBC, IRON, RETICCTPCT in the last 72 hours. Sepsis Labs: No results for input(s): PROCALCITON, LATICACIDVEN in the last 168 hours.  No results found for this or any previous visit (from the past 240 hour(s)).    Radiology Studies: No results found.      Scheduled Meds: .  stroke: mapping our early stages of recovery book   Does not apply Once  . aspirin  325 mg Oral Daily  . enoxaparin (LOVENOX) injection  40 mg Subcutaneous Q24H  . feeding supplement (ENSURE ENLIVE)  237 mL Oral BID BM  . folic acid  1 mg Oral Daily  . lactulose  20 g Oral BID  . metoprolol tartrate  25 mg Oral BID  . multivitamin with minerals  1 tablet Oral Daily  . thiamine  100 mg Oral Daily   Or  . thiamine  100 mg Intravenous Daily   Continuous Infusions:  Assessment & Plan:    1.Acute bilateral ischemic CVA.MRI of the brain showed acute CVA.Neurology evaluated, MRA of the head negative. Ultrasound carotid showed bilateral 1 to 39% ICA stenosis.A1c 5.1.Echo showed EF of 60 to 65%. Lower extremity Dopplers: Negative for DVT.Neurology recommended considering 30-day cardiac event monitoring as outpatient to rule out A. fib .Not on statin due to elevated LFTs-- can consider low dose lipitor and monitor with periodic LFTs as showing improvement. LDL 83.HDL 19. TG 239 on recent lipid profile. Will add fish oil for now to help with HDL/TG.   2.Acute metabolic encephalopathy. POA. Improved.Multifactorial: Most likely secondary to CVA in the setting of alcohol/ drug abuse.CT of the head and neck was initially negative for any significant findings.-UDS, Tylenol and salicylate levels were negative.TSH, folate, B12 levels all within normal limits.EEG: Generalized diffuse  slowing but no seizure activity Had required intermittent restraints initially during the hospitalization.Now improvedwith waxing and waning mental status.Awaiting SNF placement  3. Severe rhabdomyolysis/AKI.Resolved with IV fluids. Renal ultrasound negative. Creat at 1.3 on 10/14. Repeat labs today show further improvement in renal function.  4. Transaminitis with elevated bilirubin.Probably from alcohol abuse as well as rhabdo. -Right upper quadrant ultrasound showed hepatic steatosis with early cirrhosis with some gallbladder thickening.   5.Alcohol abuse-Advised to quit. Continue thiamine, multivitamin and folic acid  6. UTI.Present on admission. Completed 5 days of Rocephin  7. Essential hypertension.Stable.Continue metoprolol.  8. Severe malnutrition. contnutrition support   DVT prophylaxis: lovenox Code Status: Full code Family / Patient Communication: No family bedside Disposition Plan: Awaiting SNF placement     LOS: 64 days    Time spent: 25 minutes    Alessandra Bevels, MD [facility] Pager [phone]  If 7PM-7AM, please contact night-coverage [email or web address] Password [email or web address] 09/25/2019, 11:42 AM

## 2019-09-25 NOTE — TOC Progression Note (Addendum)
Transition of Care [facility]) - Progression Note    Patient Details  Name: Trust Leh MRN: [id number] Date of Birth: January 01, 1971  Transition of Care [facility]) CM/SW [place], [place] Phone Number: 09/25/2019, 3:20 PM  Clinical Narrative:    Patient remains on Difficult to Place list. CSW faxed out referral to ALF facilities as Medicaid pending.    Expected Discharge Plan: Skilled Nursing Facility Barriers to Discharge: SNF Pending Medicaid, SNF Pending payor source - LOG, Active Substance Use - Placement  Expected Discharge Plan and Services Expected Discharge Plan: [place]   Discharge Planning Services: CM Consult, Other - See comment(Pt will need LOG for SNF placement) Post Acute Care Choice: [place] arrangements for the past 2 months: Single Family Home                 DME Arranged: N/A           HH Agency: NA         Social Determinants of Health (SDOH) Interventions    Readmission Risk Interventions No flowsheet data found.

## 2019-09-25 NOTE — Progress Notes (Signed)
Nutrition Follow-up  RD working remotely.  DOCUMENTATION CODES:   Severe malnutrition in context of acute illness/injury  INTERVENTION:   -Continue MVI with minerals daily -ContinueMagic cup TID with meals, each supplement provides 290 kcal and 9 grams of protein -ContinueEnsure Enlive po BID, each supplement provides 350 kcal and 20 grams of protein  NUTRITION DIAGNOSIS:   Severe Malnutrition related to acute illness(acute toxic/metabolic encephalopathy) as evidenced by moderate fat depletion, severe fat depletion, moderate muscle depletion, severe muscle depletion, energy intake < or equal to 50% for > or equal to 5 days, percent weight loss.  Ongoing  GOAL:   Patient will meet greater than or equal to 90% of their needs  Progressing   MONITOR:   PO intake, Supplement acceptance, Labs, Weight trends, Skin, I & O's  REASON FOR ASSESSMENT:   LOS    ASSESSMENT:   Tyler [name] is a 48 y.o. male with medical history significant of polysubstance abuse including heroin.  Brought in to [facility] ED just before MN after being found down by friend last evening with nearby syringe.  Reviewed I/O's: +717 ml x 24 hours and +9.7 L since 09/11/19  UOP: 2 ml x 24 hours  Pt remains with good appetite. Noted meal completion 100%. Pt remains compliant with Ensure supplements.  Medications reviewed and include thiamine, MVI, lactulose, and folic acid.   Per CSW notes, pt remains difficult to place. Awaiting SNF placementfor discharge.    Labs reviewed: CBGS: 80-121.   Diet Order:   Diet Order            DIET DYS 3 Room service appropriate? No; Fluid consistency: Thin  Diet effective now              EDUCATION NEEDS:   Not appropriate for education at this time  Skin:  Skin Assessment: Reviewed RN Assessment  Last BM:  09/24/19  Height:   Ht Readings from Last 1 Encounters:  08/09/19 5\' 8"  (1.727 m)    Weight:   Wt Readings from Last 1 Encounters:  09/25/19  64 kg    Ideal Body Weight:  70 kg  BMI:  Body mass index is 21.45 kg/m.  Estimated Nutritional Needs:   Kcal:  2000-2200  Protein:  105-120 grams  Fluid:  > 2 L    Ebun[place]wa Gernert A. Jimmye Norman, RD, LDN, [place] Registered Dietitian II Certified Diabetes Care and Education Specialist Pager: [phone] After hours Pager: [phone]

## 2019-09-26 DIAGNOSIS — M6282 Rhabdomyolysis: Secondary | ICD-10-CM | POA: Diagnosis not present

## 2019-09-26 LAB — LIPID PANEL
Cholesterol: 197 mg/dL (ref 0–200)
HDL: 49 mg/dL (ref >40)
LDL Cholesterol: 132 mg/dL — ABNORMAL HIGH (ref 0–99)
Total CHOL/HDL Ratio: 4 RATIO
Triglycerides: 81 mg/dL (ref <150)
VLDL: 16 mg/dL (ref 0–40)

## 2019-09-26 NOTE — Progress Notes (Signed)
PROGRESS NOTE    Tyler [name]  ZOX:[id number] DOB: 01/29/1971 DOA: 07/23/2019 PCP: Patient, No Pcp Per   Brief Narrative: 48 year old with past medical history significant for polysubstance abuse including heroine presented to [facility] ER after being found down with a syringe nearby.  He was given Narcan x2 with minimal improvement.  On presentation he was found to have severe rhabdomyolysis CPK 72,000, AST 2000 ALT 1000 with acute kidney injury BUN 40 creatinine 2.4 and hyperkalemia potassium 6.2.  He was a started on IV fluids, bicarb drip and transferred to [facility] for further care.  Initial leukocytosis of 21K, thought to be related to hemoconcentration.  UDS negative.  Salicylate and acetaminophen negative.  CT head and neck were negative for acute traumatic finding.  Patient admitted to hospitalist service for rhabdomyolysis, AKI.  Rhabdomyolysis has improved, he continued to have persistent encephalopathy and was found to have acute bilateral cerebellar infarct.  Neurology was consulted and has subsequently signed off.  PT is recommending SNF.  Prolonged hospitalization due to placement issues.     Assessment & Plan:   Principal Problem:   Rhabdomyolysis Active Problems:   Acute kidney failure (HCC)   Acute metabolic encephalopathy   Polysubstance abuse (HCC)   Hyperkalemia   Transaminitis   HTN (hypertension)   Cerebral embolism with cerebral infarction   Protein-calorie malnutrition, severe  1-Acute bilateral ischemic CVA: MRI of the brain show acute CVA.   MRA negative.  Ultrasound carotid showed bilateral 1 to 39% ICA stenosis.  Hemoglobin A1c 5.1.  Echo showed normal ejection fraction.  Lower extremity Dopplers negative for DVT. Patient was evaluated by neurology who recommend 30-day cardiac monitor events. Not on a statin due to transaminases. LDL 83.  2-Acute metabolic encephalopathy; POA improved multifactorial likely secondary to CVA in the setting of alcohol drug  abuse.  UDS was negative Tylenol and salicylate are negative.  TSH and B12 normal limits.  The MRI is diffuse slowing but no seizures activity. Patient with improved mental status with waxing and waning at times.  He will need skilled nursing facility placement  3-Severe rhabdomyolysis AKI: Resolved with IV fluids.  Renal ultrasound negative.  4-Transaminases with elevated bilirubin: Related to alcohol as well as rhabdo. Right upper quadrant ultrasound showed hepatic steatosis with early cirrhosis, with some gallbladder thickening.  5-alcohol abuse: Continue thiamine and multivitamin and folic acid. 6-UTI completed 5 days Rocephin. 7-hypertension: Continue with metoprolol.Marland Kitchen. 8-Severe malnutrition continue with nutrition support.   Nutrition Problem: Severe Malnutrition Etiology: acute illness(acute toxic/metabolic encephalopathy)    Signs/Symptoms: moderate fat depletion, severe fat depletion, moderate muscle depletion, severe muscle depletion, energy intake < or equal to 50% for > or equal to 5 days, percent weight loss Percent weight loss: 8 %    Interventions: Ensure Enlive (each supplement provides 350kcal and 20 grams of protein), Magic cup, Hormel Shake, MVI  Estimated body mass index is 21.62 kg/m as calculated from the following:   Height as of this encounter: 5\' 8"  (1.727 m).   Weight as of this encounter: 64.5 kg.   DVT prophylaxis: Lovenox Code Status: Full code Family Communication: Care discussed with patient Disposition Plan: Need a skilled nursing facility awaiting placement Consultants:  Neurology   Procedures:     Antimicrobials:  ceftriaxone completed  course  Subjective: Alert, denies abdominal pain, chest pain, shortness of breath no new complaint.  Objective: Vitals:   09/25/19 2043 09/25/19 2144 09/26/19 0554 09/26/19 1420  BP: 104/71 93/80 101/69 117/75  Pulse: 78  78 67 68  Resp:  18 17 16   Temp:  98.4 F (36.9 C) 97.7 F (36.5 C) 97.9  F (36.6 C)  TempSrc:  Oral Oral Oral  SpO2:  98% 98% 98%  Weight:   64.5 kg   Height:        Intake/Output Summary (Last 24 hours) at 09/26/2019 1502 Last data filed at 09/26/2019 1021 Gross per 24 hour  Intake 240 ml  Output -  Net 240 ml   Filed Weights   09/24/19 0520 09/25/19 0606 09/26/19 0554  Weight: 66.1 kg 64 kg 64.5 kg    Examination:  General exam: Appears calm and comfortable  Respiratory system: Clear to auscultation. Respiratory effort normal. Cardiovascular system: S1 & S2 heard, RRR. No JVD, murmurs, rubs, gallops or clicks. No pedal edema. Gastrointestinal system: Abdomen is nondistended, soft and nontender. No organomegaly or masses felt. Normal bowel sounds heard. Central nervous system: Alert  Extremities: Symmetric 5 x 5 power. Skin: No rashes, lesions or ulcers   Data Reviewed: I have personally reviewed following labs and imaging studies  CBC: Recent Labs  Lab 09/25/19 0825  WBC 8.0  HGB 15.4  HCT 45.3  MCV 95.0  PLT 262   Basic Metabolic Panel: Recent Labs  Lab 09/25/19 0825  NA 140  K 4.4  CL 103  CO2 27  GLUCOSE 85  BUN 21*  CREATININE 1.18  CALCIUM 9.6   GFR: Estimated Creatinine Clearance: 68.3 mL/min (by C-G formula based on SCr of 1.18 mg/dL). Liver Function Tests: Recent Labs  Lab 09/25/19 0825  AST 52*  ALT 79*  ALKPHOS 70  BILITOT 0.6  PROT 7.7  ALBUMIN 3.6   No results for input(s): LIPASE, AMYLASE in the last 168 hours. No results for input(s): AMMONIA in the last 168 hours. Coagulation Profile: No results for input(s): INR, PROTIME in the last 168 hours. Cardiac Enzymes: No results for input(s): CKTOTAL, CKMB, CKMBINDEX, TROPONINI in the last 168 hours. BNP (last 3 results) No results for input(s): PROBNP in the last 8760 hours. HbA1C: No results for input(s): HGBA1C in the last 72 hours. CBG: No results for input(s): GLUCAP in the last 168 hours. Lipid Profile: Recent Labs    09/26/19 0224  CHOL  197  HDL 49  LDLCALC 132*  TRIG 81  CHOLHDL 4.0   Thyroid Function Tests: No results for input(s): TSH, T4TOTAL, FREET4, T3FREE, THYROIDAB in the last 72 hours. Anemia Panel: No results for input(s): VITAMINB12, FOLATE, FERRITIN, TIBC, IRON, RETICCTPCT in the last 72 hours. Sepsis Labs: No results for input(s): PROCALCITON, LATICACIDVEN in the last 168 hours.  No results found for this or any previous visit (from the past 240 hour(s)).       Radiology Studies: No results found.      Scheduled Meds: .  stroke: mapping our early stages of recovery book   Does not apply Once  . aspirin  325 mg Oral Daily  . enoxaparin (LOVENOX) injection  40 mg Subcutaneous Q24H  . feeding supplement (ENSURE ENLIVE)  237 mL Oral BID BM  . folic acid  1 mg Oral Daily  . lactulose  20 g Oral BID  . metoprolol tartrate  25 mg Oral BID  . multivitamin with minerals  1 tablet Oral Daily  . omega-3 acid ethyl esters  1 g Oral Daily  . thiamine  100 mg Oral Daily   Or  . thiamine  100 mg Intravenous Daily   Continuous Infusions:  LOS: 65 days    Time spent: 35 minutes,     Elmarie Shiley, MD [facility] Pager [phone]  If 7PM-7AM, please contact night-coverage [email or web address] Password [email or web address] 09/26/2019, 3:02 PM

## 2019-09-26 NOTE — Progress Notes (Signed)
   09/26/19 2204  MEWS Score  Pulse Rate 70  BP 95/67  Temp (!) 97.5 F (36.4 C)  SpO2 98 %  O2 Device Room Air  MEWS Score  MEWS RR 0  MEWS Pulse 0  MEWS Systolic 1  MEWS LOC 0  MEWS Temp 0  MEWS Score 1  MEWS Score Color Green  MEWS Assessment  Is this an acute change? No  MEWS Guidelines - (patients age 50 and over)  Red - At High Risk for Deterioration Yellow - At risk for Deterioration  1. Go to room and assess patient 2. Validate data. Is this patient's baseline? If data confirmed: 3. Is this an acute change? 4. Administer prn meds/treatments as ordered. 5. Note Sepsis score 6. Review goals of care 7. Sports coach, RRT nurse and Provider. 8. Ask Provider to come to bedside.  9. Document patient condition/interventions/response. 10. Increase frequency of vital signs and focused assessments to at least q15 minutes x 4, then q30 minutes x2. - If stable, then q1h x3, then q4h x3 and then q8h or dept. routine. - If unstable, contact Provider & RRT nurse. Prepare for possible transfer. 11. Add entry in progress notes using the smart phrase ".MEWS". 1. Go to room and assess patient 2. Validate data. Is this patient's baseline? If data confirmed: 3. Is this an acute change? 4. Administer prn meds/treatments as ordered? 5. Note Sepsis score 6. Review goals of care 7. Sports coach and Provider 8. Call RRT nurse as needed. 9. Document patient condition/interventions/response. 10. Increase frequency of vital signs and focused assessments to at least q2h x2. - If stable, then q4h x2 and then q8h or dept. routine. - If unstable, contact Provider & RRT nurse. Prepare for possible transfer. 11. Add entry in progress notes using the smart phrase ".MEWS".  Green - Likely stable Lavender - Comfort Care Only  1. Continue routine/ordered monitoring.  2. Review goals of care. 1. Continue routine/ordered monitoring. 2. Review goals of care.

## 2019-09-26 NOTE — TOC Progression Note (Signed)
Transition of Care [facility]) - Progression Note    Patient Details  Name: Tyler [name] MRN: [id number] Date of Birth: 08/17/1971  Transition of Care [facility]) CM/SW [place], LCSW Phone Number: 09/26/2019, 3:32 PM  Clinical Narrative:    [facility] reviewing referral.    Expected Discharge Plan: Skilled Nursing Facility Barriers to Discharge: SNF Pending Medicaid, SNF Pending payor source - LOG, Active Substance Use - Placement  Expected Discharge Plan and Services Expected Discharge Plan: [place]   Discharge Planning Services: CM Consult, Other - See comment(Pt will need LOG for SNF placement) Post Acute Care Choice: [place] arrangements for the past 2 months: Single Family Home                 DME Arranged: N/A           HH Agency: NA         Social Determinants of Health (SDOH) Interventions    Readmission Risk Interventions No flowsheet data found.

## 2019-09-26 NOTE — Plan of Care (Signed)
  Problem: Clinical Measurements: Goal: Ability to maintain clinical measurements within normal limits will improve Outcome: Progressing   Problem: Activity: Goal: Risk for activity intolerance will decrease Outcome: Progressing   Problem: Nutrition: Goal: Adequate nutrition will be maintained Outcome: Progressing   Problem: Clinical Measurements: Goal: Ability to maintain clinical measurements within normal limits will improve Outcome: Progressing Goal: Will remain free from infection Outcome: Progressing Goal: Diagnostic test results will improve Outcome: Progressing   Problem: Activity: Goal: Risk for activity intolerance will decrease Outcome: Progressing   Problem: Nutrition: Goal: Adequate nutrition will be maintained Outcome: Progressing   Problem: Health Behavior/Discharge Planning: Goal: Ability to manage health-related needs will improve Outcome: Progressing   Problem: Nutrition: Goal: Risk of aspiration will decrease Outcome: Progressing Goal: Dietary intake will improve Outcome: Progressing   Problem: Ischemic Stroke/TIA Tissue Perfusion: Goal: Complications of ischemic stroke/TIA will be minimized Outcome: Progressing   

## 2019-09-27 DIAGNOSIS — M6282 Rhabdomyolysis: Secondary | ICD-10-CM | POA: Diagnosis not present

## 2019-09-27 LAB — CBC
HCT: 41.6 % (ref 39.0–52.0)
Hemoglobin: 14 g/dL (ref 13.0–17.0)
MCH: 31.5 pg (ref 26.0–34.0)
MCHC: 33.7 g/dL (ref 30.0–36.0)
MCV: 93.7 fL (ref 80.0–100.0)
Platelets: 239 10*3/uL (ref 150–400)
RBC: 4.44 MIL/uL (ref 4.22–5.81)
RDW: 12.9 % (ref 11.5–15.5)
WBC: 8.1 10*3/uL (ref 4.0–10.5)
nRBC: 0 % (ref 0.0–0.2)

## 2019-09-27 LAB — BASIC METABOLIC PANEL
Anion gap: 9 (ref 5–15)
BUN: 29 mg/dL — ABNORMAL HIGH (ref 6–20)
CO2: 25 mmol/L (ref 22–32)
Calcium: 9.2 mg/dL (ref 8.9–10.3)
Chloride: 107 mmol/L (ref 98–111)
Creatinine, Ser: 1.22 mg/dL (ref 0.61–1.24)
GFR calc Af Amer: >60 mL/min (ref >60)
GFR calc non Af Amer: >60 mL/min (ref >60)
Glucose, Bld: 86 mg/dL (ref 70–99)
Potassium: 4 mmol/L (ref 3.5–5.1)
Sodium: 141 mmol/L (ref 135–145)

## 2019-09-27 NOTE — Progress Notes (Signed)
   09/27/19 2133  MEWS Score  Resp 16  Pulse Rate 87  BP 119/74  Temp 98.8 F (37.1 C)  Level of Consciousness Alert  SpO2 100 %  O2 Device Room Air  MEWS Score  MEWS RR 0  MEWS Pulse 0  MEWS Systolic 0  MEWS LOC 0  MEWS Temp 0  MEWS Score 0  MEWS Score Color Green  MEWS Assessment  Is this an acute change? No  MEWS Guidelines - (patients age 50 and over)  Red - At High Risk for Deterioration Yellow - At risk for Deterioration  1. Go to room and assess patient 2. Validate data. Is this patient's baseline? If data confirmed: 3. Is this an acute change? 4. Administer prn meds/treatments as ordered. 5. Note Sepsis score 6. Review goals of care 7. Sports coach, RRT nurse and Provider. 8. Ask Provider to come to bedside.  9. Document patient condition/interventions/response. 10. Increase frequency of vital signs and focused assessments to at least q15 minutes x 4, then q30 minutes x2. - If stable, then q1h x3, then q4h x3 and then q8h or dept. routine. - If unstable, contact Provider & RRT nurse. Prepare for possible transfer. 11. Add entry in progress notes using the smart phrase ".MEWS". 1. Go to room and assess patient 2. Validate data. Is this patient's baseline? If data confirmed: 3. Is this an acute change? 4. Administer prn meds/treatments as ordered? 5. Note Sepsis score 6. Review goals of care 7. Sports coach and Provider 8. Call RRT nurse as needed. 9. Document patient condition/interventions/response. 10. Increase frequency of vital signs and focused assessments to at least q2h x2. - If stable, then q4h x2 and then q8h or dept. routine. - If unstable, contact Provider & RRT nurse. Prepare for possible transfer. 11. Add entry in progress notes using the smart phrase ".MEWS".  Green - Likely stable [place] - Comfort Care Only  1. Continue routine/ordered monitoring.  2. Review goals of care. 1. Continue routine/ordered monitoring. 2. Review  goals of care.

## 2019-09-27 NOTE — Progress Notes (Signed)
PROGRESS NOTE    Tyler [name]  LKG:[id number] DOB: 06/15/1971 DOA: 07/23/2019 PCP: Patient, No Pcp Per   Brief Narrative: 48 year old with past medical history significant for polysubstance abuse including heroine presented to [facility] ER after being found down with a syringe nearby.  He was given Narcan x2 with minimal improvement.  On presentation he was found to have severe rhabdomyolysis CPK 72,000, AST 2000 ALT 1000 with acute kidney injury BUN 40 creatinine 2.4 and hyperkalemia potassium 6.2.  He was a started on IV fluids, bicarb drip and transferred to [facility] for further care.  Initial leukocytosis of 21K, thought to be related to hemoconcentration.  UDS negative.  Salicylate and acetaminophen negative.  CT head and neck were negative for acute traumatic finding.  Patient admitted to hospitalist service for rhabdomyolysis, AKI.  Rhabdomyolysis has improved, he continued to have persistent encephalopathy and was found to have acute bilateral cerebellar infarct.  Neurology was consulted and has subsequently signed off.  PT is recommending SNF.  Prolonged hospitalization due to placement issues.     Assessment & Plan:   Principal Problem:   Rhabdomyolysis Active Problems:   Acute kidney failure (HCC)   Acute metabolic encephalopathy   Polysubstance abuse (HCC)   Hyperkalemia   Transaminitis   HTN (hypertension)   Cerebral embolism with cerebral infarction   Protein-calorie malnutrition, severe  1-Acute bilateral ischemic CVA: MRI of the brain show acute CVA.   MRA negative.  Ultrasound carotid showed bilateral 1 to 39% ICA stenosis.  Hemoglobin A1c 5.1.  Echo showed normal ejection fraction.  Lower extremity Dopplers negative for DVT. Patient was evaluated by neurology who recommend 30-day cardiac monitor events. Not on a statin due to transaminases. LDL 83.  2-Acute metabolic encephalopathy; POA improved multifactorial likely secondary to CVA in the setting of alcohol drug  abuse.  UDS was negative Tylenol and salicylate are negative.  TSH and B12 normal limits.  The MRI is diffuse slowing but no seizures activity. Patient with improved mental status with waxing and waning at times.  He will need skilled nursing facility placement Patient is alert, but confused.  3-Severe rhabdomyolysis AKI: Resolved with IV fluids.  Renal ultrasound negative.  4-Transaminases with elevated bilirubin: Related to alcohol as well as rhabdo. Right upper quadrant ultrasound showed hepatic steatosis with early cirrhosis, with some gallbladder thickening.  5-alcohol abuse: Continue thiamine and multivitamin and folic acid. 6-UTI completed 5 days Rocephin. 7-hypertension: Continue with metoprolol.Marland Kitchen. 8-Severe malnutrition continue with nutrition support. 9-hypothermia: White count normal no evidence of infection.  Will check TSH.  Nutrition Problem: Severe Malnutrition Etiology: acute illness(acute toxic/metabolic encephalopathy)    Signs/Symptoms: moderate fat depletion, severe fat depletion, moderate muscle depletion, severe muscle depletion, energy intake < or equal to 50% for > or equal to 5 days, percent weight loss Percent weight loss: 8 %    Interventions: Ensure Enlive (each supplement provides 350kcal and 20 grams of protein), Magic cup, Hormel Shake, MVI  Estimated body mass index is 22.09 kg/m as calculated from the following:   Height as of this encounter: 5\' 8"  (1.727 m).   Weight as of this encounter: 65.9 kg.   DVT prophylaxis: Lovenox Code Status: Full code Family Communication: Care discussed with patient Disposition Plan: Need a skilled nursing facility awaiting placement Consultants:  Neurology   Procedures:     Antimicrobials:  ceftriaxone completed  course  Subjective: He is alert, conversant, sitting in the bed.  Following some commands.  Confused  Objective: Vitals:  09/27/19 0500 09/27/19 0527 09/27/19 0913 09/27/19 1400  BP:  (!)  95/57 (!) 134/95 110/82  Pulse:  61 64 77  Resp:    20  Temp:  (!) 97.4 F (36.3 C)  (!) 97.4 F (36.3 C)  TempSrc:  Oral  Oral  SpO2:  98%  100%  Weight: 65.9 kg     Height:        Intake/Output Summary (Last 24 hours) at 09/27/2019 1422 Last data filed at 09/27/2019 0913 Gross per 24 hour  Intake 480 ml  Output -  Net 480 ml   Filed Weights   09/25/19 0606 09/26/19 0554 09/27/19 0500  Weight: 64 kg 64.5 kg 65.9 kg    Examination:  General exam: NAD Respiratory system: Clear to auscultation Cardiovascular system:  S1-S2 regular rhythm and rate Gastrointestinal system: Bowel sounds present, soft nontender nondistended Central nervous system: Alert, confused, following commands Extremities: Symmetric power Skin: No rashes   Data Reviewed: I have personally reviewed following labs and imaging studies  CBC: Recent Labs  Lab 09/25/19 0825 09/27/19 0836  WBC 8.0 8.1  HGB 15.4 14.0  HCT 45.3 41.6  MCV 95.0 93.7  PLT 262 235   Basic Metabolic Panel: Recent Labs  Lab 09/25/19 0825 09/27/19 0836  NA 140 141  K 4.4 4.0  CL 103 107  CO2 27 25  GLUCOSE 85 86  BUN 21* 29*  CREATININE 1.18 1.22  CALCIUM 9.6 9.2   GFR: Estimated Creatinine Clearance: 67.5 mL/min (by C-G formula based on SCr of 1.22 mg/dL). Liver Function Tests: Recent Labs  Lab 09/25/19 0825  AST 52*  ALT 79*  ALKPHOS 70  BILITOT 0.6  PROT 7.7  ALBUMIN 3.6   No results for input(s): LIPASE, AMYLASE in the last 168 hours. No results for input(s): AMMONIA in the last 168 hours. Coagulation Profile: No results for input(s): INR, PROTIME in the last 168 hours. Cardiac Enzymes: No results for input(s): CKTOTAL, CKMB, CKMBINDEX, TROPONINI in the last 168 hours. BNP (last 3 results) No results for input(s): PROBNP in the last 8760 hours. HbA1C: No results for input(s): HGBA1C in the last 72 hours. CBG: No results for input(s): GLUCAP in the last 168 hours. Lipid Profile: Recent Labs     09/26/19 0224  CHOL 197  HDL 49  LDLCALC 132*  TRIG 81  CHOLHDL 4.0   Thyroid Function Tests: No results for input(s): TSH, T4TOTAL, FREET4, T3FREE, THYROIDAB in the last 72 hours. Anemia Panel: No results for input(s): VITAMINB12, FOLATE, FERRITIN, TIBC, IRON, RETICCTPCT in the last 72 hours. Sepsis Labs: No results for input(s): PROCALCITON, LATICACIDVEN in the last 168 hours.  No results found for this or any previous visit (from the past 240 hour(s)).       Radiology Studies: No results found.      Scheduled Meds: .  stroke: mapping our early stages of recovery book   Does not apply Once  . aspirin  325 mg Oral Daily  . enoxaparin (LOVENOX) injection  40 mg Subcutaneous Q24H  . feeding supplement (ENSURE ENLIVE)  237 mL Oral BID BM  . folic acid  1 mg Oral Daily  . lactulose  20 g Oral BID  . metoprolol tartrate  25 mg Oral BID  . multivitamin with minerals  1 tablet Oral Daily  . omega-3 acid ethyl esters  1 g Oral Daily  . thiamine  100 mg Oral Daily   Or  . thiamine  100 mg Intravenous Daily  Continuous Infusions:   LOS: 66 days    Time spent: 35 minutes,     Alba Cory, MD [facility] Pager [phone]  If 7PM-7AM, please contact night-coverage [email or web address] Password [email or web address] 09/27/2019, 2:22 PM

## 2019-09-27 NOTE — Plan of Care (Signed)
  Problem: Clinical Measurements: Goal: Ability to maintain clinical measurements within normal limits will improve Outcome: Progressing   Problem: Activity: Goal: Risk for activity intolerance will decrease Outcome: Progressing   Problem: Nutrition: Goal: Adequate nutrition will be maintained Outcome: Progressing   Problem: Clinical Measurements: Goal: Ability to maintain clinical measurements within normal limits will improve Outcome: Progressing Goal: Will remain free from infection Outcome: Progressing Goal: Diagnostic test results will improve Outcome: Progressing   Problem: Activity: Goal: Risk for activity intolerance will decrease Outcome: Progressing   Problem: Nutrition: Goal: Adequate nutrition will be maintained Outcome: Progressing   Problem: Health Behavior/Discharge Planning: Goal: Ability to manage health-related needs will improve Outcome: Progressing   Problem: Nutrition: Goal: Risk of aspiration will decrease Outcome: Progressing Goal: Dietary intake will improve Outcome: Progressing   Problem: Ischemic Stroke/TIA Tissue Perfusion: Goal: Complications of ischemic stroke/TIA will be minimized Outcome: Progressing   

## 2019-09-27 NOTE — Progress Notes (Signed)
Occupational Therapy Treatment Patient Details Name: Tyler [name] MRN: [id number] DOB: [birth year] Today's Date: 09/27/2019    History of present illness 48 y/o male transferred from [place] for unresponsiveness, likely secondary to substance abuse. Pt with rhabdomyolysis and AKI. EEG revealed moderate diffuse encephalopathy. PMH includes polysubstance abuse and HTN. Imaging show acute bialteral cerebellar infarcts.    OT comments  Pt appears to be at his new functional baseline for mentation and physical abilities. Pt is supervisionA to minguardA for ADL and mobility with no AD. Pt constantly requires multimodal cues to perform tasks with no carry over skills. Pt would benefit from group home, ALF or SNF mostly for 24/7 supervisionA. OT will continue to see if notice any improvements with carry over skills, otherwise, D/C next visit.     Follow Up Recommendations  SNF;Supervision/Assistance - 24 hour    Equipment Recommendations  Other (comment)(to be determined at SNF)    Recommendations for Other Services      Precautions / Restrictions Precautions Precautions: Fall Precaution Comments: poor safety awareness Restrictions Weight Bearing Restrictions: No       Mobility Bed Mobility               General bed mobility comments: in chair upon arrival  Transfers Overall transfer level: Needs assistance Equipment used: None Transfers: Sit to/from Stand Sit to Stand: Min guard         General transfer comment: sup/min guard safety    Balance Overall balance assessment: Needs assistance Sitting-balance support: Feet supported Sitting balance-Leahy Scale: Normal       Standing balance-Leahy Scale: Fair                             ADL either performed or assessed with clinical judgement   ADL Overall ADL's : Needs assistance/impaired Eating/Feeding: Independent;Sitting   Grooming: Supervision/safety;Cueing for safety;Standing                    Toilet Transfer: Supervision/safety;Cueing for safety;RW           Functional mobility during ADLs: Min guard;Cueing for safety General ADL Comments: pt unable to identify/locate ADL/toiletry items on sink counter top     Vision   Vision Assessment?: Vision impaired- to be further tested in functional context Additional Comments: unable to scan in room; not sure if inattention or difficulty with problem solving   Perception     Praxis      Cognition Arousal/Alertness: Awake/alert Behavior During Therapy: [facility] for tasks assessed/performed;Impulsive Overall Cognitive Status: Impaired/Different from baseline Area of Impairment: Orientation;Awareness;Problem solving;Memory;Following commands;Safety/judgement;Attention                 Orientation Level: Disoriented to;Place;Time;Situation Current Attention Level: Selective Memory: Decreased short-term memory Following Commands: Follows one step commands inconsistently;Follows one step commands with increased time Safety/Judgement: Decreased awareness of safety;Decreased awareness of deficits Awareness: Intellectual Problem Solving: Slow processing;Requires verbal cues;Requires tactile cues General Comments: Pt requiring tactile, verbal and visual cues to complete any commands        Exercises     Shoulder Instructions       General Comments      Pertinent Vitals/ Pain       Pain Assessment: No/denies pain  Home Living  Prior Functioning/Environment              Frequency  Min 1X/week        Progress Toward Goals  OT Goals(current goals can now be found in the care plan section)  Progress towards OT goals: Progressing toward goals  Acute Rehab OT Goals Patient Stated Goal: not stated OT Goal Formulation: With patient Time For Goal Achievement: 10/11/19 Potential to Achieve Goals: Fair ADL Goals Pt Will Perform  Grooming: with set-up;with supervision;standing Pt Will Perform Upper Body Bathing: with set-up;with supervision;standing Pt Will Perform Lower Body Bathing: with min guard assist;with supervision;with set-up;sit to/from stand Pt Will Perform Upper Body Dressing: with supervision;with set-up;standing Pt Will Perform Lower Body Dressing: with min guard assist;with supervision;with set-up;sit to/from stand Pt Will Transfer to Toilet: with modified independence;Independently;ambulating Pt Will Perform Toileting - Clothing Manipulation and hygiene: with supervision;sit to/from stand Pt Will Perform Tub/Shower Transfer: with modified independence;ambulating;shower seat;3 in 1;grab bars Additional ADL Goal #1: pt will identify 3/3 ADL/toiletry items and retrieve them in prep for selfcare  Plan Discharge plan remains appropriate;Frequency needs to be updated    Co-evaluation                 AM-PAC OT "6 Clicks" Daily Activity     Outcome Measure   Help from another person eating meals?: None Help from another person taking care of personal grooming?: A Little Help from another person toileting, which includes using toliet, bedpan, or urinal?: A Little Help from another person bathing (including washing, rinsing, drying)?: A Little Help from another person to put on and taking off regular upper body clothing?: A Little Help from another person to put on and taking off regular lower body clothing?: A Little 6 Click Score: 19    End of Session Equipment Utilized During Treatment: Gait belt  OT Visit Diagnosis: Other abnormalities of gait and mobility (R26.89);Other symptoms and signs involving cognitive function;Other symptoms and signs involving the nervous system (R29.898);Muscle weakness (generalized) (M62.81)   Activity Tolerance Patient tolerated treatment well   Patient Left in chair;with call bell/phone within reach;with chair alarm set   Nurse Communication Mobility status         Time: 0174-9449 OT Time Calculation (min): 22 min  Charges: OT General Charges $OT Visit: 1 Visit OT Treatments $Self Care/Home Management : 8-22 mins  Cristi Loron) Glendell Docker OTR/L Acute Rehabilitation Services Pager: [phone] Office: [phone]    Lonzo Cloud 09/27/2019, 5:15 PM

## 2019-09-27 NOTE — Progress Notes (Signed)
Chaplain making rounds noticed pt has been in hospital for 60+ days. Zyren was pleasantly looking out the door of is room from the chair. Chaplain entered and sat down. Chaplain had conversation with Deane during which he repeatedly said he was lost and unsure of where his location. Adelaido stated, "I am somewhere between here and there..." Pt also stated that he was far away from [place]. Chaplain excused herself to answer the phone and Toribio attempted to follow Chaplain setting off chair exit alarm. Chaplain asked Royston to remain in the chair and allow her to send help to him. Chaplain remains available for support as needed.   Chaplain Resident, Evelene Croon, M Div. Pager # [phone] on-call Pager # [phone] personal

## 2019-09-27 NOTE — Significant Event (Signed)
   09/27/19 2133  What Happened  Was fall witnessed? Yes  Who witnessed fall? Tyler [name]  Patients activity before fall ambulating-unassisted  Point of contact arm/shoulder (right)  Was patient injured? No  Follow Up  MD notified Tyler Corpus, NP  Time MD notified 2133  Family notified Yes - comment  Time family notified 2146  Additional tests No  Progress note created (see row info) Yes  Adult Fall Risk Assessment  Risk Factor Category (scoring not indicated) Fall has occurred during this admission (document High fall risk)  Patient Fall Risk Level High fall risk  Adult Fall Risk Interventions  Required Bundle Interventions *See Row Information* High fall risk - low, moderate, and high requirements implemented  Additional Interventions Use of appropriate toileting equipment (bedpan, BSC, etc.);Room near nurses station  Screening for Fall Injury Risk (To be completed on HIGH fall risk patients) - Assessing Need for Low Bed  Risk For Fall Injury- Low Bed Criteria Previous fall this admission  Will Implement Low Bed and Floor Mats Yes  Vitals  Temp 98.8 F (37.1 C)  Temp Source Oral  BP 119/74  MAP (mmHg) 86  BP Location Left Arm  BP Method Automatic  Patient Position (if appropriate) Sitting  Pulse Rate 87  Pulse Rate Source Dinamap  Resp 16  Oxygen Therapy  SpO2 100 %  O2 Device Room Air  Pain Assessment  Pain Scale 0-10  Pain Score 0  PCA/Epidural/Spinal Assessment  Respiratory Pattern Regular;Unlabored;Symmetrical  Neurological  Neuro (WDL) X  Level of Consciousness Alert  Orientation Level Oriented to person;Disoriented to place;Disoriented to time;Disoriented to situation  Economist  Pupil Assessment  No  Additional Pupil Assessments No  Neuro Symptoms Agitation  Glasgow Coma Scale  Eye Opening 4  Best Verbal Response (NON-intubated) 4  Best Motor Response 6   Glasgow Coma Scale Score 14  Musculoskeletal  Musculoskeletal (WDL) X  Assistive Device None  Generalized Weakness Yes  Weight Bearing Restrictions No  Integumentary  Integumentary (WDL) WDL  Skin Color Appropriate for ethnicity  Skin Condition Dry  Skin Integrity Intact  Skin Turgor Non-tenting    NT Tyler reported that patient fell while trying to remove the sheets from his bed.  She stated that he landed on his right shoulder while trying to brace his fall with his right hand.  Bed alarm was activated at the time of the fall.  There is no change in the patient's prior mental status.  Patient denies any pain, no evidence of injury is noted on assessment.  No additional orders received from Tyler Corpus, NP.  Daughter Tyler [name] was notified of the fall, she thanked me for the notification and had no further questions or concerns.

## 2019-09-28 ENCOUNTER — Inpatient Hospital Stay (HOSPITAL_COMMUNITY): Payer: Medicaid Other

## 2019-09-28 DIAGNOSIS — M6282 Rhabdomyolysis: Secondary | ICD-10-CM | POA: Diagnosis not present

## 2019-09-28 LAB — TSH: TSH: 1.579 u[IU]/mL (ref 0.350–4.500)

## 2019-09-28 MED ORDER — TRAMADOL HCL 50 MG PO TABS
50.0000 mg | ORAL_TABLET | Freq: Once | ORAL | Status: AC
  Administered 2019-09-28: 06:00:00 50 mg via ORAL
  Filled 2019-09-28: qty 1

## 2019-09-28 MED ORDER — SUCRALFATE 1 GM/10ML PO SUSP
1.0000 g | Freq: Three times a day (TID) | ORAL | Status: DC
  Administered 2019-09-28 – 2019-11-06 (×151): 1 g via ORAL
  Filled 2019-09-28 (×151): qty 10

## 2019-09-28 MED ORDER — PANTOPRAZOLE SODIUM 40 MG PO TBEC
40.0000 mg | DELAYED_RELEASE_TABLET | Freq: Every day | ORAL | Status: DC
  Administered 2019-09-28 – 2019-12-16 (×61): 40 mg via ORAL
  Filled 2019-09-28 (×69): qty 1

## 2019-09-28 MED ORDER — METHOCARBAMOL 500 MG PO TABS
500.0000 mg | ORAL_TABLET | Freq: Once | ORAL | Status: AC
  Administered 2019-09-28: 06:00:00 500 mg via ORAL
  Filled 2019-09-28: qty 1

## 2019-09-28 NOTE — Progress Notes (Signed)
Patient complaining of back pain this morning.  Grimacing, hollering out, swearing.  Cannot quantify pain, but it appears to come and go in spasms.  Paged [facility] provider Jeannette Corpus, who ordered 500mg  robaxin, 50mg  tramadol, and a thoracic spine xray.

## 2019-09-28 NOTE — Progress Notes (Signed)
PROGRESS NOTE    Tyler [name]  YWV:[id number] DOB: 1971-01-30 DOA: 07/23/2019 PCP: Patient, No Pcp Per   Brief Narrative: 48 year old with past medical history significant for polysubstance abuse including heroine presented to [facility] ER after being found down with a syringe nearby.  He was given Narcan x2 with minimal improvement.  On presentation he was found to have severe rhabdomyolysis CPK 72,000, AST 2000 ALT 1000 with acute kidney injury BUN 40 creatinine 2.4 and hyperkalemia potassium 6.2.  He was a started on IV fluids, bicarb drip and transferred to [facility] for further care.  Initial leukocytosis of 21K, thought to be related to hemoconcentration.  UDS negative.  Salicylate and acetaminophen negative.  CT head and neck were negative for acute traumatic finding.  Patient admitted to hospitalist service for rhabdomyolysis, AKI.  Rhabdomyolysis has improved, he continued to have persistent encephalopathy and was found to have acute bilateral cerebellar infarct.  Neurology was consulted and has subsequently signed off.  PT is recommending SNF.  Prolonged hospitalization due to placement issues.     Assessment & Plan:   Principal Problem:   Rhabdomyolysis Active Problems:   Acute kidney failure (HCC)   Acute metabolic encephalopathy   Polysubstance abuse (HCC)   Hyperkalemia   Transaminitis   HTN (hypertension)   Cerebral embolism with cerebral infarction   Protein-calorie malnutrition, severe  1-Acute bilateral ischemic CVA: MRI of the brain show acute CVA.   MRA negative.  Ultrasound carotid showed bilateral 1 to 39% ICA stenosis.  Hemoglobin A1c 5.1.  Echo showed normal ejection fraction.  Lower extremity Dopplers negative for DVT. Patient was evaluated by neurology who recommend 30-day cardiac monitor events. Not on a statin due to transaminases. LDL 83.  2-Acute metabolic encephalopathy; POA improved multifactorial likely secondary to CVA in the setting of alcohol drug  abuse.  UDS was negative Tylenol and salicylate are negative.  TSH and B12 normal limits.  The MRI is diffuse slowing but no seizures activity. Patient with improved mental status with waxing and waning at times.  He will need skilled nursing facility placement Patient is alert, but confused.  3-Severe rhabdomyolysis AKI: Resolved with IV fluids.  Renal ultrasound negative.  4-Transaminases with elevated bilirubin: Related to alcohol as well as rhabdo. Right upper quadrant ultrasound showed hepatic steatosis with early cirrhosis, with some gallbladder thickening.  5-alcohol abuse: Continue thiamine and multivitamin and folic acid. 6-UTI completed 5 days Rocephin. 7-hypertension: Continue with metoprolol.Marland Kitchen 8-Severe malnutrition continue with nutrition support. 9-hypothermia: White count normal no evidence of infection.  TSH normal . Resolved.  10-abdominal pain, back pain; complaint of pain last night.  X ray showed unchanged T 11 compression fracture, age indeterminate L 1 fracture.  Report abdominal pain this am. Will add carafate and PPI./   Nutrition Problem: Severe Malnutrition Etiology: acute illness(acute toxic/metabolic encephalopathy)    Signs/Symptoms: moderate fat depletion, severe fat depletion, moderate muscle depletion, severe muscle depletion, energy intake < or equal to 50% for > or equal to 5 days, percent weight loss Percent weight loss: 8 %    Interventions: Ensure Enlive (each supplement provides 350kcal and 20 grams of protein), Magic cup, Hormel Shake, MVI  Estimated body mass index is 21.65 kg/m as calculated from the following:   Height as of this encounter: 5\' 8"  (1.727 m).   Weight as of this encounter: 64.6 kg.   DVT prophylaxis: Lovenox Code Status: Full code Family Communication: Care discussed with patient Disposition Plan: Need a skilled nursing facility awaiting  placement Consultants:  Neurology   Procedures:     Antimicrobials:   ceftriaxone completed  course  Subjective: Alert, complaints of abdominal pain.   Objective: Vitals:   09/27/19 2133 09/28/19 0048 09/28/19 0500 09/28/19 0521  BP: 119/74 105/79  (!) 125/98  Pulse: 87 78  64  Resp: 16 18    Temp: 98.8 F (37.1 C) 98.2 F (36.8 C)  97.9 F (36.6 C)  TempSrc: Oral Oral  Oral  SpO2: 100% 98%  100%  Weight:   64.6 kg   Height:        Intake/Output Summary (Last 24 hours) at 09/28/2019 1300 Last data filed at 09/28/2019 1027 Gross per 24 hour  Intake 240 ml  Output -  Net 240 ml   Filed Weights   09/26/19 0554 09/27/19 0500 09/28/19 0500  Weight: 64.5 kg 65.9 kg 64.6 kg    Examination:  General exam: NAD Respiratory system: CTA Cardiovascular system: S 1, S 2 RRR Gastrointestinal system: BS present, soft, mild epigastric tenderness Central nervous system: alert confuse Extremities: Symmetric power.  Skin: No rashes   Data Reviewed: I have personally reviewed following labs and imaging studies  CBC: Recent Labs  Lab 09/25/19 0825 09/27/19 0836  WBC 8.0 8.1  HGB 15.4 14.0  HCT 45.3 41.6  MCV 95.0 93.7  PLT 262 239   Basic Metabolic Panel: Recent Labs  Lab 09/25/19 0825 09/27/19 0836  NA 140 141  K 4.4 4.0  CL 103 107  CO2 27 25  GLUCOSE 85 86  BUN 21* 29*  CREATININE 1.18 1.22  CALCIUM 9.6 9.2   GFR: Estimated Creatinine Clearance: 66.2 mL/min (by C-G formula based on SCr of 1.22 mg/dL). Liver Function Tests: Recent Labs  Lab 09/25/19 0825  AST 52*  ALT 79*  ALKPHOS 70  BILITOT 0.6  PROT 7.7  ALBUMIN 3.6   No results for input(s): LIPASE, AMYLASE in the last 168 hours. No results for input(s): AMMONIA in the last 168 hours. Coagulation Profile: No results for input(s): INR, PROTIME in the last 168 hours. Cardiac Enzymes: No results for input(s): CKTOTAL, CKMB, CKMBINDEX, TROPONINI in the last 168 hours. BNP (last 3 results) No results for input(s): PROBNP in the last 8760 hours. HbA1C: No results  for input(s): HGBA1C in the last 72 hours. CBG: No results for input(s): GLUCAP in the last 168 hours. Lipid Profile: Recent Labs    09/26/19 0224  CHOL 197  HDL 49  LDLCALC 132*  TRIG 81  CHOLHDL 4.0   Thyroid Function Tests: Recent Labs    09/28/19 0310  TSH 1.579   Anemia Panel: No results for input(s): VITAMINB12, FOLATE, FERRITIN, TIBC, IRON, RETICCTPCT in the last 72 hours. Sepsis Labs: No results for input(s): PROCALCITON, LATICACIDVEN in the last 168 hours.  No results found for this or any previous visit (from the past 240 hour(s)).       Radiology Studies: Dg Thoracic Spine 2 View  Result Date: 09/28/2019 CLINICAL DATA:  Fall, mid back pain. EXAM: THORACIC SPINE 2 VIEWS COMPARISON:  09/23/2008 FINDINGS: T11 compression fracture appears similar to the study of 09/23/2008. Age-indeterminate compression fracture at L1, not present in 2009. Osteopenia with preservation of spinal alignment. Incidental imaging of the chest is unremarkable. IMPRESSION: 1. Age-indeterminate compression fracture at L1. Correlate with physical exam and lumbar spine imaging as warranted. 2. Unchanged T11 compression fracture. 3. No acute abnormality in the thoracic spine. Electronically Signed   By: Jason FilaGeoffrey  Wile M.D.  On: 09/28/2019 08:35        Scheduled Meds: .  stroke: mapping our early stages of recovery book   Does not apply Once  . aspirin  325 mg Oral Daily  . enoxaparin (LOVENOX) injection  40 mg Subcutaneous Q24H  . feeding supplement (ENSURE ENLIVE)  237 mL Oral BID BM  . folic acid  1 mg Oral Daily  . lactulose  20 g Oral BID  . metoprolol tartrate  25 mg Oral BID  . multivitamin with minerals  1 tablet Oral Daily  . omega-3 acid ethyl esters  1 g Oral Daily  . pantoprazole  40 mg Oral Daily  . thiamine  100 mg Oral Daily   Or  . thiamine  100 mg Intravenous Daily   Continuous Infusions:   LOS: 67 days    Time spent: 35 minutes,     Alba Cory, MD  [facility] Pager [phone]  If 7PM-7AM, please contact night-coverage [email or web address] Password [email or web address] 09/28/2019, 1:00 PM

## 2019-09-28 NOTE — Progress Notes (Signed)
Physical Therapy Treatment Patient Details Name: Tyler [name] MRN: [id number] DOB: July 07, 1971 Today's Date: 09/28/2019    History of Present Illness 48 y/o male transferred from [place] [facility] for unresponsiveness, likely secondary to substance abuse. Pt with rhabdomyolysis and AKI. EEG revealed moderate diffuse encephalopathy. PMH includes polysubstance abuse and HTN. Imaging show acute bialteral cerebellar infarcts.     PT Comments    Noted fall last night - Pt with increased back pain today - tried to teach him body mechanics but no evidence of learning.  Pt not oriented to person or place - even with max cues.  Pt walked in the halls - worked on Science writer but no evidence of learning.  Pt will need 24/7 care at DC.   Follow Up Recommendations  Supervision/Assistance - 24 hour;SNF     Equipment Recommendations  None recommended by PT    Recommendations for Other Services       Precautions / Restrictions Precautions Precautions: Fall Precaution Comments: Pt had a fall last evening with increased back pain today.  no evidence of learning Restrictions Weight Bearing Restrictions: No Other Position/Activity Restrictions: Tried to teach pt good body mechanics - no evidence of learning    Mobility  Bed Mobility Overal bed mobility: Needs Assistance Bed Mobility: Supine to Sit;Sit to Supine   Sidelying to sit: Min guard Supine to sit: Min guard     General bed mobility comments: pt got OOB - tried to teach him good body mechanics with new back pain but he didnt follow.  Transfers Overall transfer level: Needs assistance Equipment used: None Transfers: Sit to/from Stand Sit to Stand: Min guard         General transfer comment: sup/min guard safety  Ambulation/Gait Ambulation/Gait assistance: Min guard Gait Distance (Feet): 600 Feet Assistive device: None Gait Pattern/deviations: Step-through pattern;Drifts right/left     General Gait  Details: close supervision, tactile and verbal cues for obstacle avoidance in hallway, Unaware of environment-easily confused, disoriented.  Pt had 2 episodes of back spasms walking - educated pt to tighten his stomach when this occurs   Social research officer, government Rank[employer] (Stroke Patients Only)       Balance       Sitting balance - Comments: able to sit unsupport with no LOB       Standing balance comment: poor awareness of environment                            Cognition Arousal/Alertness: Awake/alert Behavior During Therapy: WFL for tasks assessed/performed Overall Cognitive Status: Impaired/Different from baseline                                 General Comments: Pt oriented to place but no retention of place or situation.   pt able to follow one step commands less than half of the time.  Poor overall awarenss      Exercises      General Comments General comments (skin integrity, edema, etc.): After walking 600 feet - pt with pulse 80bpm and pulse ox 97% on room air with no SOB      Pertinent Vitals/Pain Pain Assessment: Faces Faces Pain Scale: Hurts whole lot Pain Location: pt hurting in his back - Xrays show old Tll and L1 fractures Pain Descriptors /  Indicators: Moaning;Grimacing;Guarding;Spasm;Shooting Pain Intervention(s): Monitored during session    Home Living                      Prior Function            PT Goals (current goals can now be found in the care plan section) Progress towards PT goals: Progressing toward goals    Frequency    Min 2X/week      PT Plan Current plan remains appropriate    Co-evaluation              AM-PAC PT "6 Clicks" Mobility   Outcome Measure  Help needed turning from your back to your side while in a flat bed without using bedrails?: A Little Help needed moving from lying on your back to sitting on the side of a flat bed without using  bedrails?: A Little Help needed moving to and from a bed to a chair (including a wheelchair)?: A Little Help needed standing up from a chair using your arms (e.g., wheelchair or bedside chair)?: A Little Help needed to walk in hospital room?: A Little Help needed climbing 3-5 steps with a railing? : A Little 6 Click Score: 18    End of Session Equipment Utilized During Treatment: Gait belt Activity Tolerance: Patient tolerated treatment well Patient left: in bed;with bed alarm set;with call bell/phone within reach Nurse Communication: Mobility status;Precautions PT Visit Diagnosis: Unsteadiness on feet (R26.81);Difficulty in walking, not elsewhere classified (R26.2);Other symptoms and signs involving the nervous system (R29.898);Pain     Time: 1410-1430 PT Time Calculation (min) (ACUTE ONLY): 20 min  Charges:  $Gait Training: 8-22 mins                     09/28/2019   Rande Lawman, PT    Loyal Buba 09/28/2019, 2:48 PM

## 2019-09-28 NOTE — Progress Notes (Addendum)
Patient endorsing pain in left side.  Unable to quantify pain due to altered mental state.  No evidence of injury to left side.  Tylenol 650mg  PO given.

## 2019-09-29 DIAGNOSIS — M6282 Rhabdomyolysis: Secondary | ICD-10-CM | POA: Diagnosis not present

## 2019-09-29 LAB — GLUCOSE, CAPILLARY: Glucose-Capillary: 131 mg/dL — ABNORMAL HIGH (ref 70–99)

## 2019-09-29 NOTE — Progress Notes (Signed)
   09/28/19 2150  MEWS Score  Resp 20  Pulse Rate 74  BP 108/86  Temp 98.9 F (37.2 C)  SpO2 98 %  O2 Device Room Air  MEWS Score  MEWS RR 0  MEWS Pulse 0  MEWS Systolic 0  MEWS LOC 0  MEWS Temp 0  MEWS Score 0  MEWS Score Color Green  MEWS Assessment  Is this an acute change? No  MEWS Guidelines - (patients age 50 and over)  Red - At High Risk for Deterioration Yellow - At risk for Deterioration  1. Go to room and assess patient 2. Validate data. Is this patient's baseline? If data confirmed: 3. Is this an acute change? 4. Administer prn meds/treatments as ordered. 5. Note Sepsis score 6. Review goals of care 7. Sports coach, RRT nurse and Provider. 8. Ask Provider to come to bedside.  9. Document patient condition/interventions/response. 10. Increase frequency of vital signs and focused assessments to at least q15 minutes x 4, then q30 minutes x2. - If stable, then q1h x3, then q4h x3 and then q8h or dept. routine. - If unstable, contact Provider & RRT nurse. Prepare for possible transfer. 11. Add entry in progress notes using the smart phrase ".MEWS". 1. Go to room and assess patient 2. Validate data. Is this patient's baseline? If data confirmed: 3. Is this an acute change? 4. Administer prn meds/treatments as ordered? 5. Note Sepsis score 6. Review goals of care 7. Sports coach and Provider 8. Call RRT nurse as needed. 9. Document patient condition/interventions/response. 10. Increase frequency of vital signs and focused assessments to at least q2h x2. - If stable, then q4h x2 and then q8h or dept. routine. - If unstable, contact Provider & RRT nurse. Prepare for possible transfer. 11. Add entry in progress notes using the smart phrase ".MEWS".  Green - Likely stable Lavender - Comfort Care Only  1. Continue routine/ordered monitoring.  2. Review goals of care. 1. Continue routine/ordered monitoring. 2. Review goals of care.

## 2019-09-29 NOTE — Plan of Care (Signed)
  Problem: Clinical Measurements: Goal: Ability to maintain clinical measurements within normal limits will improve Outcome: Progressing   Problem: Activity: Goal: Risk for activity intolerance will decrease Outcome: Progressing   Problem: Nutrition: Goal: Adequate nutrition will be maintained Outcome: Progressing   Problem: Clinical Measurements: Goal: Ability to maintain clinical measurements within normal limits will improve Outcome: Progressing Goal: Will remain free from infection Outcome: Progressing Goal: Diagnostic test results will improve Outcome: Progressing   Problem: Activity: Goal: Risk for activity intolerance will decrease Outcome: Progressing   Problem: Nutrition: Goal: Adequate nutrition will be maintained Outcome: Progressing   Problem: Health Behavior/Discharge Planning: Goal: Ability to manage health-related needs will improve Outcome: Progressing   Problem: Nutrition: Goal: Risk of aspiration will decrease Outcome: Progressing Goal: Dietary intake will improve Outcome: Progressing   Problem: Ischemic Stroke/TIA Tissue Perfusion: Goal: Complications of ischemic stroke/TIA will be minimized Outcome: Progressing   

## 2019-09-29 NOTE — Progress Notes (Signed)
PROGRESS NOTE    Tyler [name]  MLY:[id number] DOB: 05/26/1971 DOA: 07/23/2019 PCP: Patient, No Pcp Per   Brief Narrative: 48 year old with past medical history significant for polysubstance abuse including heroine presented to [facility] ER after being found down with a syringe nearby.  He was given Narcan x2 with minimal improvement.  On presentation he was found to have severe rhabdomyolysis CPK 72,000, AST 2000 ALT 1000 with acute kidney injury BUN 40 creatinine 2.4 and hyperkalemia potassium 6.2.  He was a started on IV fluids, bicarb drip and transferred to [facility] for further care.  Initial leukocytosis of 21K, thought to be related to hemoconcentration.  UDS negative.  Salicylate and acetaminophen negative.  CT head and neck were negative for acute traumatic finding.  Patient admitted to hospitalist service for rhabdomyolysis, AKI.  Rhabdomyolysis has improved, he continued to have persistent encephalopathy and was found to have acute bilateral cerebellar infarct.  Neurology was consulted and has subsequently signed off.  PT is recommending SNF.  Prolonged hospitalization due to placement issues.     Assessment & Plan:   Principal Problem:   Rhabdomyolysis Active Problems:   Acute kidney failure (HCC)   Acute metabolic encephalopathy   Polysubstance abuse (HCC)   Hyperkalemia   Transaminitis   HTN (hypertension)   Cerebral embolism with cerebral infarction   Protein-calorie malnutrition, severe  1-Acute bilateral ischemic CVA: MRI of the brain show acute CVA.   MRA negative.  Ultrasound carotid showed bilateral 1 to 39% ICA stenosis.  Hemoglobin A1c 5.1.  Echo showed normal ejection fraction.  Lower extremity Dopplers negative for DVT. Patient was evaluated by neurology who recommend 30-day cardiac monitor events. Not on a statin due to transaminases. LDL 83.  2-Acute metabolic encephalopathy; POA improved multifactorial likely secondary to CVA in the setting of alcohol drug  abuse.  UDS was negative Tylenol and salicylate are negative.  TSH and B12 normal limits.  The MRI is diffuse slowing but no seizures activity. Patient with improved mental status with waxing and waning at times.  He will need skilled nursing facility placement Patient alert, confuse.   3-Severe rhabdomyolysis AKI: Resolved with IV fluids.  Renal ultrasound negative.  4-Transaminases with elevated bilirubin: Related to alcohol as well as rhabdo. Right upper quadrant ultrasound showed hepatic steatosis with early cirrhosis, with some gallbladder thickening. Follow trend.   5-alcohol abuse: Continue thiamine and multivitamin and folic acid. 6-UTI completed 5 days Rocephin. 7-hypertension: Continue with metoprolol.Marland Kitchen 8-Severe malnutrition continue with nutrition support. 9-hypothermia: White count normal no evidence of infection.  TSH normal . Resolved.  10-abdominal pain, back pain; Denies pain.  X ray showed unchanged T 11 compression fracture, age indeterminate L 1 fracture.  Report abdominal pain this am. adde carafate and PPI./   Nutrition Problem: Severe Malnutrition Etiology: acute illness(acute toxic/metabolic encephalopathy)    Signs/Symptoms: moderate fat depletion, severe fat depletion, moderate muscle depletion, severe muscle depletion, energy intake < or equal to 50% for > or equal to 5 days, percent weight loss Percent weight loss: 8 %    Interventions: Ensure Enlive (each supplement provides 350kcal and 20 grams of protein), Magic cup, Hormel Shake, MVI  Estimated body mass index is 21.65 kg/m as calculated from the following:   Height as of this encounter: 5\' 8"  (1.727 m).   Weight as of this encounter: 64.6 kg.   DVT prophylaxis: Lovenox Code Status: Full code Family Communication: Care discussed with patient Disposition Plan: Need a skilled nursing facility awaiting placement Consultants:  Neurology   Procedures:     Antimicrobials:  ceftriaxone  completed  course  Subjective: Denies chest pain, back pain or abdominal pain   Objective: Vitals:   09/28/19 1441 09/28/19 2150 09/29/19 0533 09/29/19 0825  BP:  108/86 100/76 (!) 160/90  Pulse: 82 74 63   Resp:  20    Temp:  98.9 F (37.2 C) (!) 97.5 F (36.4 C)   TempSrc:  Oral Oral   SpO2: 98% 98% 99%   Weight:      Height:        Intake/Output Summary (Last 24 hours) at 09/29/2019 1301 Last data filed at 09/29/2019 0845 Gross per 24 hour  Intake 720 ml  Output -  Net 720 ml   Filed Weights   09/26/19 0554 09/27/19 0500 09/28/19 0500  Weight: 64.5 kg 65.9 kg 64.6 kg    Examination:  General exam: NAD Respiratory system; CTA Cardiovascular system: S 1, S 2 RRR Gastrointestinal system: BS present, soft, nt Central nervous system:alert, confuse Extremities: Symmetric power Skin: No rashes   Data Reviewed: I have personally reviewed following labs and imaging studies  CBC: Recent Labs  Lab 09/25/19 0825 09/27/19 0836  WBC 8.0 8.1  HGB 15.4 14.0  HCT 45.3 41.6  MCV 95.0 93.7  PLT 262 741   Basic Metabolic Panel: Recent Labs  Lab 09/25/19 0825 09/27/19 0836  NA 140 141  K 4.4 4.0  CL 103 107  CO2 27 25  GLUCOSE 85 86  BUN 21* 29*  CREATININE 1.18 1.22  CALCIUM 9.6 9.2   GFR: Estimated Creatinine Clearance: 66.2 mL/min (by C-G formula based on SCr of 1.22 mg/dL). Liver Function Tests: Recent Labs  Lab 09/25/19 0825  AST 52*  ALT 79*  ALKPHOS 70  BILITOT 0.6  PROT 7.7  ALBUMIN 3.6   No results for input(s): LIPASE, AMYLASE in the last 168 hours. No results for input(s): AMMONIA in the last 168 hours. Coagulation Profile: No results for input(s): INR, PROTIME in the last 168 hours. Cardiac Enzymes: No results for input(s): CKTOTAL, CKMB, CKMBINDEX, TROPONINI in the last 168 hours. BNP (last 3 results) No results for input(s): PROBNP in the last 8760 hours. HbA1C: No results for input(s): HGBA1C in the last 72 hours. CBG: No  results for input(s): GLUCAP in the last 168 hours. Lipid Profile: No results for input(s): CHOL, HDL, LDLCALC, TRIG, CHOLHDL, LDLDIRECT in the last 72 hours. Thyroid Function Tests: Recent Labs    09/28/19 0310  TSH 1.579   Anemia Panel: No results for input(s): VITAMINB12, FOLATE, FERRITIN, TIBC, IRON, RETICCTPCT in the last 72 hours. Sepsis Labs: No results for input(s): PROCALCITON, LATICACIDVEN in the last 168 hours.  No results found for this or any previous visit (from the past 240 hour(s)).       Radiology Studies: Dg Thoracic Spine 2 View  Result Date: 09/28/2019 CLINICAL DATA:  Fall, mid back pain. EXAM: THORACIC SPINE 2 VIEWS COMPARISON:  09/23/2008 FINDINGS: T11 compression fracture appears similar to the study of 09/23/2008. Age-indeterminate compression fracture at L1, not present in 2009. Osteopenia with preservation of spinal alignment. Incidental imaging of the chest is unremarkable. IMPRESSION: 1. Age-indeterminate compression fracture at L1. Correlate with physical exam and lumbar spine imaging as warranted. 2. Unchanged T11 compression fracture. 3. No acute abnormality in the thoracic spine. Electronically Signed   By: Zetta Bills M.D.   On: 09/28/2019 08:35        Scheduled Meds: .  stroke: mapping  our early stages of recovery book   Does not apply Once  . aspirin  325 mg Oral Daily  . enoxaparin (LOVENOX) injection  40 mg Subcutaneous Q24H  . feeding supplement (ENSURE ENLIVE)  237 mL Oral BID BM  . folic acid  1 mg Oral Daily  . lactulose  20 g Oral BID  . metoprolol tartrate  25 mg Oral BID  . multivitamin with minerals  1 tablet Oral Daily  . omega-3 acid ethyl esters  1 g Oral Daily  . pantoprazole  40 mg Oral Daily  . sucralfate  1 g Oral TID WC & HS  . thiamine  100 mg Oral Daily   Or  . thiamine  100 mg Intravenous Daily   Continuous Infusions:   LOS: 68 days    Time spent: 35 minutes,     Alba CoryBelkys A Bernedette Auston, MD [facility] Pager [phone]  If 7PM-7AM, please contact night-coverage [email or web address] Password [email or web address] 09/29/2019, 1:01 PM

## 2019-09-30 DIAGNOSIS — M6282 Rhabdomyolysis: Secondary | ICD-10-CM | POA: Diagnosis not present

## 2019-09-30 MED ORDER — ACETAMINOPHEN 325 MG PO TABS
650.0000 mg | ORAL_TABLET | ORAL | Status: DC | PRN
  Administered 2019-09-30 – 2019-12-15 (×28): 650 mg via ORAL
  Filled 2019-09-30 (×28): qty 2

## 2019-09-30 MED ORDER — ACETAMINOPHEN 650 MG RE SUPP: 650.0000 mg | Freq: Four times a day (QID) | RECTAL | Status: DC | PRN

## 2019-09-30 NOTE — Progress Notes (Signed)
PROGRESS NOTE    Tyler [name]  HYW:[id number] DOB: [birth year] DOA: 07/23/2019 PCP: Patient, No Pcp Per   Brief Narrative: 47 year old with past medical history significant for polysubstance abuse including heroine presented to [facility] ER after being found down with a syringe nearby.  He was given Narcan x2 with minimal improvement.  On presentation he was found to have severe rhabdomyolysis CPK 72,000, AST 2000 ALT 1000 with acute kidney injury BUN 40 creatinine 2.4 and hyperkalemia potassium 6.2.  He was a started on IV fluids, bicarb drip and transferred to [facility] for further care.  Initial leukocytosis of 21K, thought to be related to hemoconcentration.  UDS negative.  Salicylate and acetaminophen negative.  CT head and neck were negative for acute traumatic finding.  Patient admitted to hospitalist service for rhabdomyolysis, AKI.  Rhabdomyolysis has improved, he continued to have persistent encephalopathy and was found to have acute bilateral cerebellar infarct.  Neurology was consulted and has subsequently signed off.  PT is recommending SNF.  Prolonged hospitalization due to placement issues.     Assessment & Plan:   Principal Problem:   Rhabdomyolysis Active Problems:   Acute kidney failure (HCC)   Acute metabolic encephalopathy   Polysubstance abuse (HCC)   Hyperkalemia   Transaminitis   HTN (hypertension)   Cerebral embolism with cerebral infarction   Protein-calorie malnutrition, severe  1-Acute bilateral ischemic CVA: MRI of the brain show acute CVA.   MRA negative.  Ultrasound carotid showed bilateral 1 to 39% ICA stenosis.  Hemoglobin A1c 5.1.  Echo showed normal ejection fraction.  Lower extremity Dopplers negative for DVT. Patient was evaluated by neurology who recommend 30-day cardiac monitor events. Not on a statin due to transaminases. Follow LFT trend LDL 83.   2-Acute metabolic encephalopathy; POA improved multifactorial likely secondary to CVA in the  setting of alcohol drug abuse.  UDS was negative Tylenol and salicylate are negative.  TSH and B12 normal limits.  The MRI is diffuse slowing but no seizures activity. Patient with improved mental status with waxing and waning at times.  He will need skilled nursing facility placement Alert, confuse  3-Severe rhabdomyolysis AKI: Resolved with IV fluids.  Renal ultrasound negative.  4-Transaminases with elevated bilirubin: Related to alcohol as well as rhabdo. Right upper quadrant ultrasound showed hepatic steatosis with early cirrhosis, with some gallbladder thickening. Follow trend.   5-alcohol abuse: Continue thiamine and multivitamin and folic acid. 6-UTI completed 5 days Rocephin. 7-hypertension: Continue with metoprolol.Marland Kitchen 8-Severe malnutrition continue with nutrition support. 9-hypothermia: White count normal no evidence of infection.  TSH normal . Resolved.  10-abdominal pain, back pain; Resolved.  X ray showed unchanged T 11 compression fracture, age indeterminate L 1 fracture.  Report abdominal pain this am. adde carafate and PPI./   Nutrition Problem: Severe Malnutrition Etiology: acute illness(acute toxic/metabolic encephalopathy)    Signs/Symptoms: moderate fat depletion, severe fat depletion, moderate muscle depletion, severe muscle depletion, energy intake < or equal to 50% for > or equal to 5 days, percent weight loss Percent weight loss: 8 %    Interventions: Ensure Enlive (each supplement provides 350kcal and 20 grams of protein), Magic cup, Hormel Shake, MVI  Estimated body mass index is 22.12 kg/m as calculated from the following:   Height as of this encounter: 5\' 8"  (1.727 m).   Weight as of this encounter: 66 kg.   DVT prophylaxis: Lovenox Code Status: Full code Family Communication: Care discussed with patient Disposition Plan: Need a skilled nursing facility awaiting placement  Consultants:  Neurology   Procedures:     Antimicrobials:   ceftriaxone completed  course  Subjective: Patient is alert, confused, denies abdominal pain today. Objective: Vitals:   09/29/19 1341 09/29/19 2121 09/30/19 0356 09/30/19 0851  BP: (!) 140/96 125/88  112/71  Pulse: 73 81  70  Resp: 18 16    Temp: 98 F (36.7 C) 97.7 F (36.5 C)    TempSrc:  Oral    SpO2: 100% 100%    Weight:   66 kg   Height:        Intake/Output Summary (Last 24 hours) at 09/30/2019 1216 Last data filed at 09/30/2019 0945 Gross per 24 hour  Intake 620 ml  Output -  Net 620 ml   Filed Weights   09/27/19 0500 09/28/19 0500 09/30/19 0356  Weight: 65.9 kg 64.6 kg 66 kg    Examination:  General exam: NAD Respiratory system; CTA Cardiovascular system: S1, S2 regular rhythm and rate Gastrointestinal system: Bowel sounds present, soft nontender nondistended Central nervous system: Alert confused follow commands Extremities: Symmetric power    Data Reviewed: I have personally reviewed following labs and imaging studies  CBC: Recent Labs  Lab 09/25/19 0825 09/27/19 0836  WBC 8.0 8.1  HGB 15.4 14.0  HCT 45.3 41.6  MCV 95.0 93.7  PLT 262 239   Basic Metabolic Panel: Recent Labs  Lab 09/25/19 0825 09/27/19 0836  NA 140 141  K 4.4 4.0  CL 103 107  CO2 27 25  GLUCOSE 85 86  BUN 21* 29*  CREATININE 1.18 1.22  CALCIUM 9.6 9.2   GFR: Estimated Creatinine Clearance: 67.6 mL/min (by C-G formula based on SCr of 1.22 mg/dL). Liver Function Tests: Recent Labs  Lab 09/25/19 0825  AST 52*  ALT 79*  ALKPHOS 70  BILITOT 0.6  PROT 7.7  ALBUMIN 3.6   No results for input(s): LIPASE, AMYLASE in the last 168 hours. No results for input(s): AMMONIA in the last 168 hours. Coagulation Profile: No results for input(s): INR, PROTIME in the last 168 hours. Cardiac Enzymes: No results for input(s): CKTOTAL, CKMB, CKMBINDEX, TROPONINI in the last 168 hours. BNP (last 3 results) No results for input(s): PROBNP in the last 8760 hours. HbA1C: No  results for input(s): HGBA1C in the last 72 hours. CBG: Recent Labs  Lab 09/29/19 2118  GLUCAP 131*   Lipid Profile: No results for input(s): CHOL, HDL, LDLCALC, TRIG, CHOLHDL, LDLDIRECT in the last 72 hours. Thyroid Function Tests: Recent Labs    09/28/19 0310  TSH 1.579   Anemia Panel: No results for input(s): VITAMINB12, FOLATE, FERRITIN, TIBC, IRON, RETICCTPCT in the last 72 hours. Sepsis Labs: No results for input(s): PROCALCITON, LATICACIDVEN in the last 168 hours.  No results found for this or any previous visit (from the past 240 hour(s)).       Radiology Studies: No results found.      Scheduled Meds: .  stroke: mapping our early stages of recovery book   Does not apply Once  . aspirin  325 mg Oral Daily  . enoxaparin (LOVENOX) injection  40 mg Subcutaneous Q24H  . feeding supplement (ENSURE ENLIVE)  237 mL Oral BID BM  . folic acid  1 mg Oral Daily  . lactulose  20 g Oral BID  . metoprolol tartrate  25 mg Oral BID  . multivitamin with minerals  1 tablet Oral Daily  . omega-3 acid ethyl esters  1 g Oral Daily  . pantoprazole  40 mg  Oral Daily  . sucralfate  1 g Oral TID WC & HS  . thiamine  100 mg Oral Daily   Or  . thiamine  100 mg Intravenous Daily   Continuous Infusions:   LOS: 69 days    Time spent: 35 minutes,     Alba Cory, MD [facility] Pager [phone]  If 7PM-7AM, please contact night-coverage [email or web address] Password [email or web address] 09/30/2019, 12:16 PM

## 2019-10-01 DIAGNOSIS — M6282 Rhabdomyolysis: Secondary | ICD-10-CM | POA: Diagnosis not present

## 2019-10-01 MED ORDER — INFLUENZA VAC SPLIT QUAD 0.5 ML IM SUSY
0.5000 mL | PREFILLED_SYRINGE | INTRAMUSCULAR | Status: AC
  Administered 2019-10-03: 0.5 mL via INTRAMUSCULAR
  Filled 2019-10-01 (×2): qty 0.5

## 2019-10-01 NOTE — Progress Notes (Signed)
PROGRESS NOTE    Tyler [name]  WYO:[id number] DOB: 08/01/1971 DOA: 07/23/2019 PCP: Patient, No Pcp Per   Brief Narrative: 48 year old with past medical history significant for polysubstance abuse including heroine presented to [facility] ER after being found down with a syringe nearby.  He was given Narcan x2 with minimal improvement.  On presentation he was found to have severe rhabdomyolysis CPK 72,000, AST 2000 ALT 1000 with acute kidney injury BUN 40 creatinine 2.4 and hyperkalemia potassium 6.2.  He was a started on IV fluids, bicarb drip and transferred to [facility] for further care.  Initial leukocytosis of 21K, thought to be related to hemoconcentration.  UDS negative.  Salicylate and acetaminophen negative.  CT head and neck were negative for acute traumatic finding.  Patient admitted to hospitalist service for rhabdomyolysis, AKI.  Rhabdomyolysis has improved, he continued to have persistent encephalopathy and was found to have acute bilateral cerebellar infarct.  Neurology was consulted and has subsequently signed off.  PT is recommending SNF.  Prolonged hospitalization due to placement issues.     Assessment & Plan:   Principal Problem:   Rhabdomyolysis Active Problems:   Acute kidney failure (HCC)   Acute metabolic encephalopathy   Polysubstance abuse (HCC)   Hyperkalemia   Transaminitis   HTN (hypertension)   Cerebral embolism with cerebral infarction   Protein-calorie malnutrition, severe  1-Acute bilateral ischemic CVA: MRI of the brain show acute CVA.   MRA negative.  Ultrasound carotid showed bilateral 1 to 39% ICA stenosis.  Hemoglobin A1c 5.1.  Echo showed normal ejection fraction.  Lower extremity Dopplers negative for DVT. Patient was evaluated by neurology who recommend 30-day cardiac monitor events. Not on a statin due to transaminases. Follow LFT trend LDL 83.   2-Acute metabolic encephalopathy; POA improved multifactorial likely secondary to CVA in the  setting of alcohol drug abuse.  UDS was negative Tylenol and salicylate are negative.  TSH and B12 normal limits.  The MRI is diffuse slowing but no seizures activity. Patient with improved mental status with waxing and waning at times.  He will need skilled nursing facility placement Alert, confuse  3-Severe rhabdomyolysis AKI: Resolved with IV fluids.  Renal ultrasound negative.  4-Transaminases with elevated bilirubin: Related to alcohol as well as rhabdo. Right upper quadrant ultrasound showed hepatic steatosis with early cirrhosis, with some gallbladder thickening. Follow trend.   5-alcohol abuse: Continue thiamine and multivitamin and folic acid. 6-UTI completed 5 days Rocephin. 7-hypertension: Continue with metoprolol.Marland Kitchen 8-Severe malnutrition continue with nutrition support. 9-hypothermia: White count normal no evidence of infection.  TSH normal . Resolved.  10-abdominal pain, back pain; Resolved.  X ray showed unchanged T 11 compression fracture, age indeterminate L 1 fracture.  Report abdominal pain this am. added carafate and PPI./   Nutrition Problem: Severe Malnutrition Etiology: acute illness(acute toxic/metabolic encephalopathy)    Signs/Symptoms: moderate fat depletion, severe fat depletion, moderate muscle depletion, severe muscle depletion, energy intake < or equal to 50% for > or equal to 5 days, percent weight loss Percent weight loss: 8 %    Interventions: Ensure Enlive (each supplement provides 350kcal and 20 grams of protein), Magic cup, Hormel Shake, MVI  Estimated body mass index is 22.12 kg/m as calculated from the following:   Height as of this encounter: 5\' 8"  (1.727 m).   Weight as of this encounter: 66 kg.   DVT prophylaxis: Lovenox Code Status: Full code Family Communication: Care discussed with patient Disposition Plan: Need a skilled nursing facility awaiting placement  Consultants:  Neurology   Procedures:     Antimicrobials:   ceftriaxone completed  course  Subjective: Patient is alert, denies abdominal pain. Eating breakfast   Objective: Vitals:   09/30/19 0851 09/30/19 1424 09/30/19 2112 10/01/19 0528  BP: 112/71 (!) 118/94 (!) 123/94 (!) 146/90  Pulse: 70 76 77 63  Resp:  18 16   Temp:  98.3 F (36.8 C) 98.3 F (36.8 C) 98.1 F (36.7 C)  TempSrc:  Oral Oral Oral  SpO2:  100% 99% 100%  Weight:      Height:        Intake/Output Summary (Last 24 hours) at 10/01/2019 1422 Last data filed at 09/30/2019 2100 Gross per 24 hour  Intake 120 ml  Output -  Net 120 ml   Filed Weights   09/27/19 0500 09/28/19 0500 09/30/19 0356  Weight: 65.9 kg 64.6 kg 66 kg    Examination:  General exam:NAD Respiratory system; CTA Cardiovascular system: S 1, S 2 RRR Gastrointestinal system: BS present, soft, nt Central nervous system: alert, following command Extremities: Symmetric power.     Data Reviewed: I have personally reviewed following labs and imaging studies  CBC: Recent Labs  Lab 09/25/19 0825 09/27/19 0836  WBC 8.0 8.1  HGB 15.4 14.0  HCT 45.3 41.6  MCV 95.0 93.7  PLT 262 239   Basic Metabolic Panel: Recent Labs  Lab 09/25/19 0825 09/27/19 0836  NA 140 141  K 4.4 4.0  CL 103 107  CO2 27 25  GLUCOSE 85 86  BUN 21* 29*  CREATININE 1.18 1.22  CALCIUM 9.6 9.2   GFR: Estimated Creatinine Clearance: 67.6 mL/min (by C-G formula based on SCr of 1.22 mg/dL). Liver Function Tests: Recent Labs  Lab 09/25/19 0825  AST 52*  ALT 79*  ALKPHOS 70  BILITOT 0.6  PROT 7.7  ALBUMIN 3.6   No results for input(s): LIPASE, AMYLASE in the last 168 hours. No results for input(s): AMMONIA in the last 168 hours. Coagulation Profile: No results for input(s): INR, PROTIME in the last 168 hours. Cardiac Enzymes: No results for input(s): CKTOTAL, CKMB, CKMBINDEX, TROPONINI in the last 168 hours. BNP (last 3 results) No results for input(s): PROBNP in the last 8760 hours. HbA1C: No results  for input(s): HGBA1C in the last 72 hours. CBG: Recent Labs  Lab 09/29/19 2118  GLUCAP 131*   Lipid Profile: No results for input(s): CHOL, HDL, LDLCALC, TRIG, CHOLHDL, LDLDIRECT in the last 72 hours. Thyroid Function Tests: No results for input(s): TSH, T4TOTAL, FREET4, T3FREE, THYROIDAB in the last 72 hours. Anemia Panel: No results for input(s): VITAMINB12, FOLATE, FERRITIN, TIBC, IRON, RETICCTPCT in the last 72 hours. Sepsis Labs: No results for input(s): PROCALCITON, LATICACIDVEN in the last 168 hours.  No results found for this or any previous visit (from the past 240 hour(s)).       Radiology Studies: No results found.      Scheduled Meds: .  stroke: mapping our early stages of recovery book   Does not apply Once  . aspirin  325 mg Oral Daily  . enoxaparin (LOVENOX) injection  40 mg Subcutaneous Q24H  . feeding supplement (ENSURE ENLIVE)  237 mL Oral BID BM  . folic acid  1 mg Oral Daily  . lactulose  20 g Oral BID  . metoprolol tartrate  25 mg Oral BID  . multivitamin with minerals  1 tablet Oral Daily  . omega-3 acid ethyl esters  1 g Oral Daily  .  pantoprazole  40 mg Oral Daily  . sucralfate  1 g Oral TID WC & HS  . thiamine  100 mg Oral Daily   Or  . thiamine  100 mg Intravenous Daily   Continuous Infusions:   LOS: 70 days    Time spent: 35 minutes,     Elmarie Shiley, MD [facility] Pager [phone]  If 7PM-7AM, please contact night-coverage [email or web address] Password [email or web address] 10/01/2019, 2:22 PM

## 2019-10-01 NOTE — TOC Progression Note (Signed)
Transition of Care [facility]) - Progression Note    Patient Details  Name: Tyler [name] MRN: [id number] Date of Birth: 04-16-1971  Transition of Care [facility]) CM/SW Parker, LCSW Phone Number: 10/01/2019, 4:16 PM  Clinical Narrative:    CSW continuing to follow for [facility] waitlist. Still awaiting response from [facility].    Expected Discharge Plan: Skilled Nursing Facility Barriers to Discharge: SNF Pending Medicaid, SNF Pending payor source - LOG, Active Substance Use - Placement  Expected Discharge Plan and Services Expected Discharge Plan: [place]   Discharge Planning Services: CM Consult, Other - See comment(Pt will need LOG for SNF placement) Post Acute Care Choice: [place] arrangements for the past 2 months: Single Family Home                 DME Arranged: N/A           HH Agency: NA         Social Determinants of Health (SDOH) Interventions    Readmission Risk Interventions No flowsheet data found.

## 2019-10-02 DIAGNOSIS — M6282 Rhabdomyolysis: Secondary | ICD-10-CM | POA: Diagnosis not present

## 2019-10-02 NOTE — Progress Notes (Signed)
PROGRESS NOTE    Tyler [name]  JXB:[id number] DOB: 07/07/1971 DOA: 07/23/2019 PCP: Patient, No Pcp Per   Brief Narrative: 48 year old with past medical history significant for polysubstance abuse including heroine [facility] after being found down with a syringe nearby.  He was given Narcan x2 with minimal improvement.  On presentation he was found to have severe rhabdomyolysis CPK 72,000, AST 2000 ALT 1000 with acute kidney injury BUN 40 creatinine 2.4 and hyperkalemia potassium 6.2.  He was a started on IV fluids, bicarb drip and [facility].  Initial leukocytosis of 21K, thought to be related to hemoconcentration.  UDS negative.  Salicylate and acetaminophen negative.  CT head and neck were negative for acute traumatic finding.  Patient admitted to hospitalist service for rhabdomyolysis, AKI.  Rhabdomyolysis has improved, he continued to have persistent encephalopathy and was found to have acute bilateral cerebellar infarct.  Neurology was consulted and has subsequently signed off.  PT is recommending SNF.  Prolonged hospitalization due to placement issues.     Assessment & Plan:   Principal Problem:   Rhabdomyolysis Active Problems:   Acute kidney failure (HCC)   Acute metabolic encephalopathy   Polysubstance abuse (HCC)   Hyperkalemia   Transaminitis   HTN (hypertension)   Cerebral embolism with cerebral infarction   Protein-calorie malnutrition, severe  1-Acute bilateral ischemic CVA: MRI of the brain show acute CVA.   MRA negative.  Ultrasound carotid showed bilateral 1 to 39% ICA stenosis.  Hemoglobin A1c 5.1.  Echo showed normal ejection fraction.  Lower extremity Dopplers negative for DVT. Patient was evaluated by neurology who recommend 30-day cardiac monitor events. Not on a statin due to transaminases. Follow LFT trend LDL 83.   2-Acute metabolic encephalopathy; POA improved multifactorial likely secondary to CVA in the  setting of alcohol drug abuse.  UDS was negative Tylenol and salicylate are negative.  TSH and B12 normal limits.  The MRI is diffuse slowing but no seizures activity. Patient with improved mental status with waxing and waning at times.  He will need skilled nursing facility placement Alert, confused.   3-Severe rhabdomyolysis AKI: Resolved with IV fluids.  Renal ultrasound negative.  4-Transaminases with elevated bilirubin: Related to alcohol as well as rhabdo. Right upper quadrant ultrasound showed hepatic steatosis with early cirrhosis, with some gallbladder thickening. Follow trend.   5-alcohol abuse: Continue thiamine and multivitamin and folic acid. 6-UTI completed 5 days Rocephin. 7-Hypertension: Continue with metoprolol.Marland Kitchen. 8-Severe malnutrition continue with nutrition support. 9-Hypothermia: White count normal no evidence of infection.  TSH normal . Resolved.  10-abdominal pain, back pain; Resolved.  X ray showed unchanged T 11 compression fracture, age indeterminate L 1 fracture.  Report abdominal pain this am. added carafate and PPI./   Nutrition Problem: Severe Malnutrition Etiology: acute illness(acute toxic/metabolic encephalopathy)    Signs/Symptoms: moderate fat depletion, severe fat depletion, moderate muscle depletion, severe muscle depletion, energy intake < or equal to 50% for > or equal to 5 days, percent weight loss Percent weight loss: 8 %    Interventions: Ensure Enlive (each supplement provides 350kcal and 20 grams of protein), Magic cup, Hormel Shake, MVI  Estimated body mass index is 22.29 kg/m as calculated from the following:   Height as of this encounter: 5\' 8"  (1.727 m).   Weight as of this encounter: 66.5 kg.   DVT prophylaxis: Lovenox Code Status: Full code Family Communication: Care discussed with patient Disposition Plan: Need a skilled nursing facility awaiting  placement Consultants:  Neurology   Procedures:     Antimicrobials:   ceftriaxone completed  course  Subjective: Alert, eating breakfast. Denies pain   Objective: Vitals:   10/01/19 2138 10/02/19 0506 10/02/19 0545 10/02/19 1244  BP: (!) 119/99 (!) 123/94  102/81  Pulse: 75 82  76  Resp: 18 18  17   Temp: 98.4 F (36.9 C) 97.7 F (36.5 C)  (!) 97.5 F (36.4 C)  TempSrc: Oral Oral  Oral  SpO2: 99% 97%  99%  Weight:   66.5 kg   Height:        Intake/Output Summary (Last 24 hours) at 10/02/2019 1415 Last data filed at 10/02/2019 1235 Gross per 24 hour  Intake 920 ml  Output -  Net 920 ml   Filed Weights   09/28/19 0500 09/30/19 0356 10/02/19 0545  Weight: 64.6 kg 66 kg 66.5 kg    Examination:  General exam: NAD Respiratory system; CTA Cardiovascular system: S 1, S 2 RRR Gastrointestinal system: BS, Present, soft,  nt Central nervous system: Alert, follow commands Extremities: Symmetric power.     Data Reviewed: I have personally reviewed following labs and imaging studies  CBC: Recent Labs  Lab 09/27/19 0836  WBC 8.1  HGB 14.0  HCT 41.6  MCV 93.7  PLT 239   Basic Metabolic Panel: Recent Labs  Lab 09/27/19 0836  NA 141  K 4.0  CL 107  CO2 25  GLUCOSE 86  BUN 29*  CREATININE 1.22  CALCIUM 9.2   GFR: Estimated Creatinine Clearance: 68.1 mL/min (by C-G formula based on SCr of 1.22 mg/dL). Liver Function Tests: No results for input(s): AST, ALT, ALKPHOS, BILITOT, PROT, ALBUMIN in the last 168 hours. No results for input(s): LIPASE, AMYLASE in the last 168 hours. No results for input(s): AMMONIA in the last 168 hours. Coagulation Profile: No results for input(s): INR, PROTIME in the last 168 hours. Cardiac Enzymes: No results for input(s): CKTOTAL, CKMB, CKMBINDEX, TROPONINI in the last 168 hours. BNP (last 3 results) No results for input(s): PROBNP in the last 8760 hours. HbA1C: No results for input(s): HGBA1C in the last 72 hours. CBG: Recent Labs  Lab 09/29/19 2118  GLUCAP 131*   Lipid Profile: No  results for input(s): CHOL, HDL, LDLCALC, TRIG, CHOLHDL, LDLDIRECT in the last 72 hours. Thyroid Function Tests: No results for input(s): TSH, T4TOTAL, FREET4, T3FREE, THYROIDAB in the last 72 hours. Anemia Panel: No results for input(s): VITAMINB12, FOLATE, FERRITIN, TIBC, IRON, RETICCTPCT in the last 72 hours. Sepsis Labs: No results for input(s): PROCALCITON, LATICACIDVEN in the last 168 hours.  No results found for this or any previous visit (from the past 240 hour(s)).       Radiology Studies: No results found.      Scheduled Meds: .  stroke: mapping our early stages of recovery book   Does not apply Once  . aspirin  325 mg Oral Daily  . enoxaparin (LOVENOX) injection  40 mg Subcutaneous Q24H  . feeding supplement (ENSURE ENLIVE)  237 mL Oral BID BM  . folic acid  1 mg Oral Daily  . influenza vac split quadrivalent PF  0.5 mL Intramuscular Tomorrow-1000  . lactulose  20 g Oral BID  . metoprolol tartrate  25 mg Oral BID  . multivitamin with minerals  1 tablet Oral Daily  . omega-3 acid ethyl esters  1 g Oral Daily  . pantoprazole  40 mg Oral Daily  . sucralfate  1 g Oral TID WC &  HS  . thiamine  100 mg Oral Daily   Or  . thiamine  100 mg Intravenous Daily   Continuous Infusions:   LOS: 71 days    Time spent: 35 minutes,     Elmarie Shiley, MD [facility] Pager [phone]  If 7PM-7AM, please contact night-coverage [email or web address] Password [email or web address] 10/02/2019, 2:15 PM

## 2019-10-02 NOTE — Progress Notes (Signed)
Physical Therapy Treatment Patient Details Name: Tyler [name] MRN: [id number] DOB: Apr 08, 1971 Today's Date: 10/02/2019    History of Present Illness 48 y/o male transferred from [place] for unresponsiveness, likely secondary to substance abuse. Pt with rhabdomyolysis and AKI. EEG revealed moderate diffuse encephalopathy. PMH includes polysubstance abuse and HTN. Imaging show acute bialteral cerebellar infarcts.     PT Comments    Pt currently requires supervision with occasional min guard assist for gait without AD. Pt demonstrates great cognitive deficits with impaired intellectual awareness & requires max cuing & extra time to follow all commands. Pt would benefit from higher level balance training as he can follow commands to do so, and it would be beneficial to complete Berg Balance Test to objectively assess pt's balance. Current plan remains appropriate & Pt would benefit from continued skilled PT while in acute setting to address deficits noted.    Follow Up Recommendations  Supervision/Assistance - 24 hour;SNF     Equipment Recommendations  None recommended by PT    Recommendations for Other Services       Precautions / Restrictions Precautions Precautions: Fall Restrictions Weight Bearing Restrictions: No    Mobility  Bed Mobility Overal bed mobility: Needs Assistance Bed Mobility: Supine to Sit;Sit to Supine     Supine to sit: Min assist(tactile cuing to initiate) Sit to supine: Min assist(tactile cuing to initiate)   General bed mobility comments: Pt requires max multimodal cuing to initiate bed mobility with extra time to follow commands & execute tasks  Transfers   Equipment used: None Transfers: Sit to/from Stand Sit to Stand: Min guard;Supervision            Ambulation/Gait Ambulation/Gait assistance: Supervision;Min guard Gait Distance (Feet): (400 ft) Assistive device: None Gait Pattern/deviations: Drifts right/left;Decreased stride  length Gait velocity: slightly decreased       Stairs             Wheelchair Mobility    Modified Rankin (Stroke Patients Only) Modified Rankin (Stroke Patients Only) Pre-Morbid Rankin Score: No symptoms Modified Rankin: Moderately severe disability     Balance Overall balance assessment: Needs assistance Sitting-balance support: Feet supported Sitting balance-Leahy Scale: Normal     Standing balance support: No upper extremity supported;Single extremity supported Standing balance-Leahy Scale: Poor Standing balance comment: pt dons clean brief with poor awareness to sit even with cuing so pt elects to stand with 1UE support & CGA<>min assist for dynamic balance during task                            Cognition Arousal/Alertness: Awake/alert   Overall Cognitive Status: Impaired/Different from baseline Area of Impairment: Orientation;Awareness;Problem solving;Memory;Following commands;Safety/judgement;Attention                 Orientation Level: Disoriented to;Place;Time;Situation   Memory: Decreased short-term memory Following Commands: Follows one step commands inconsistently;Follows one step commands with increased time Safety/Judgement: Decreased awareness of safety;Decreased awareness of deficits Awareness: Intellectual Problem Solving: Slow processing;Requires verbal cues;Requires tactile cues General Comments: Pt avoided answering orientaion questions, pt with great difficulty following commands requiring cuing & extra time for increased process. Very poor overall safety awareness & awareness of deficits      Exercises      General Comments General comments (skin integrity, edema, etc.): Pt performs retrograde gait x 25 ft without BUE support & min assist with max cuing to follow commands to complete task.      Pertinent Vitals/Pain  Pain Assessment: Faces Faces Pain Scale: Hurts a little bit Pain Location: back Pain Descriptors /  Indicators: Aching;Moaning Pain Intervention(s): Monitored during session    Home Living                      Prior Function            PT Goals (current goals can now be found in the care plan section) Acute Rehab PT Goals Patient Stated Goal: not stated PT Goal Formulation: Patient unable to participate in goal setting Time For Goal Achievement: 10/05/19 Potential to Achieve Goals: Fair Progress towards PT goals: Progressing toward goals    Frequency    Min 2X/week      PT Plan Current plan remains appropriate    Co-evaluation              AM-PAC PT "6 Clicks" Mobility   Outcome Measure  Help needed turning from your back to your side while in a flat bed without using bedrails?: A Little Help needed moving from lying on your back to sitting on the side of a flat bed without using bedrails?: A Little Help needed moving to and from a bed to a chair (including a wheelchair)?: A Little Help needed standing up from a chair using your arms (e.g., wheelchair or bedside chair)?: A Little Help needed to walk in hospital room?: A Little Help needed climbing 3-5 steps with a railing? : A Little 6 Click Score: 18    End of Session Equipment Utilized During Treatment: Gait belt Activity Tolerance: Patient tolerated treatment well Patient left: in chair;with chair alarm set;with call bell/phone within reach Nurse Communication: Mobility status PT Visit Diagnosis: Unsteadiness on feet (R26.81);Difficulty in walking, not elsewhere classified (R26.2);Other symptoms and signs involving the nervous system (R29.898);Pain     Time: 1062-6948 PT Time Calculation (min) (ACUTE ONLY): 23 min  Charges:  $Therapeutic Activity: 8-22 mins(10) $Neuromuscular Re-education: 8-22 mins(13)                        Sandi Mariscal, PT, DPT 10/02/2019, 10:27 AM

## 2019-10-02 NOTE — Progress Notes (Signed)
Nutrition Follow-up  DOCUMENTATION CODES:   Severe malnutrition in context of acute illness/injury  INTERVENTION:   -Continue MVI with minerals daily -ContinueMagic cup TID with meals, each supplement provides 290 kcal and 9 grams of protein -ContinueEnsure Enlive po BID, each supplement provides 350 kcal and 20 grams of protein  NUTRITION DIAGNOSIS:   Severe Malnutrition related to acute illness(acute toxic/metabolic encephalopathy) as evidenced by moderate fat depletion, severe fat depletion, moderate muscle depletion, severe muscle depletion, energy intake < or equal to 50% for > or equal to 5 days, percent weight loss.  Ongoing  GOAL:   Patient will meet greater than or equal to 90% of their needs  Progressing   MONITOR:   PO intake, Supplement acceptance, Labs, Weight trends, Skin, I & O's  REASON FOR ASSESSMENT:   LOS    ASSESSMENT:   Tyler [name] is a 48 y.o. male with medical history significant of polysubstance abuse including heroin.  Brought in to [facility] ED just before MN after being found down by friend last evening with nearby syringe.  Reviewed I/O's: 1.3 L x 24 hours and +8.6 L since 09/18/19  Pt remains with good appetite. Noted meal completion 25-100%. Pt remains compliant with Ensure supplements.  Noted wt gain trend over the past month, which is favorable given malnutrition. Pt has experienced a 8.8% wt gain over the past month.   Medications reviewed and include thiamine, MVI, lactulose, and folic acid.   Per CSW notes, pt remains difficult to place. Awaiting SNF placementfor discharge.  Labs reviewed: CBGS: 131.   Diet Order:   Diet Order            DIET DYS 3 Room service appropriate? No; Fluid consistency: Thin  Diet effective now              EDUCATION NEEDS:   Not appropriate for education at this time  Skin:  Skin Assessment: Reviewed RN Assessment  Last BM:  10/01/19  Height:   Ht Readings from Last 1 Encounters:   08/09/19 5\' 8"  (1.727 m)    Weight:   Wt Readings from Last 1 Encounters:  10/02/19 66.5 kg    Ideal Body Weight:  70 kg  BMI:  Body mass index is 22.29 kg/m.  Estimated Nutritional Needs:   Kcal:  2000-2200  Protein:  105-120 grams  Fluid:  > 2 L    Tyler [name] A. Jimmye Norman, RD, LDN, [place] Registered Dietitian II Certified Diabetes Care and Education Specialist Pager: [phone] After hours Pager: [phone]

## 2019-10-02 NOTE — Plan of Care (Signed)
  Problem: Clinical Measurements: Goal: Ability to maintain clinical measurements within normal limits will improve Outcome: Progressing   Problem: Activity: Goal: Risk for activity intolerance will decrease Outcome: Progressing   Problem: Nutrition: Goal: Adequate nutrition will be maintained Outcome: Progressing   Problem: Health Behavior/Discharge Planning: Goal: Ability to manage health-related needs will improve Outcome: Progressing   Problem: Nutrition: Goal: Risk of aspiration will decrease Outcome: Progressing Goal: Dietary intake will improve Outcome: Progressing   Problem: Ischemic Stroke/TIA Tissue Perfusion: Goal: Complications of ischemic stroke/TIA will be minimized Outcome: Progressing   Problem: Clinical Measurements: Goal: Ability to maintain clinical measurements within normal limits will improve Outcome: Progressing Goal: Will remain free from infection Outcome: Progressing Goal: Diagnostic test results will improve Outcome: Progressing   Problem: Activity: Goal: Risk for activity intolerance will decrease Outcome: Progressing   Problem: Nutrition: Goal: Adequate nutrition will be maintained Outcome: Progressing

## 2019-10-03 DIAGNOSIS — M6282 Rhabdomyolysis: Secondary | ICD-10-CM | POA: Diagnosis not present

## 2019-10-03 NOTE — Progress Notes (Signed)
Marland Kitchen  PROGRESS NOTE    Tyler [name]  YIR:[id number] DOB: 10/24/1971 DOA: 07/23/2019 PCP: Patient, No Pcp Per   Brief Narrative:   48 year old with past medical history significant for polysubstance abuse including heroine presented to [facility] ER after being found down with a syringe nearby.  He was given Narcan x2 with minimal improvement.  On presentation he was found to have severe rhabdomyolysis CPK 72,000, AST 2000 ALT 1000 with acute kidney injury BUN 40 creatinine 2.4 and hyperkalemia potassium 6.2.  He was a started on IV fluids, bicarb drip and transferred to [facility] for further care.  Initial leukocytosis of 21K, thought to be related to hemoconcentration.  UDS negative.  Salicylate and acetaminophen negative.  CT head and neck were negative for acute traumatic finding.  Patient admitted to hospitalist service for rhabdomyolysis, AKI.  Rhabdomyolysis has improved, he continued to have persistent encephalopathy and was found to have acute bilateral cerebellar infarct.  Neurology was consulted and has subsequently signed off.  PT is recommending SNF.  Prolonged hospitalization due to placement issues.  10/28: No acute events ON per nursing.   Assessment & Plan:   Principal Problem:   Rhabdomyolysis Active Problems:   Acute kidney failure (HCC)   Acute metabolic encephalopathy   Polysubstance abuse (HCC)   Hyperkalemia   Transaminitis   HTN (hypertension)   Cerebral embolism with cerebral infarction   Protein-calorie malnutrition, severe  Acute bilateral ischemic CVA:      - MRI of the brain show acute CVA.       - MRA negative.       - Ultrasound carotid showed bilateral 1 to 39% ICA stenosis.       - Hemoglobin A1c 5.1.       - Echo showed normal ejection fraction.       - Lower extremity Dopplers negative for DVT.     - Patient was evaluated by neurology who recommend 30-day cardiac monitor events.     - statin held d/t elevated LFT; trend LFTs (improving)   Acute  metabolic encephalopathy, POA, multifactorial:     - likely secondary to CVA in the setting of alcohol drug abuse.       - UDS was negative Tylenol and salicylate are negative.       - TSH and B12 normal limits.       - The MRI is diffuse slowing but no seizures activity.     - per nursing mentation waxes/wanes; will need placement  Severe rhabdomyolysis AKI     - Resolved with IV fluids.       - Renal ultrasound negative.  Transaminases with elevated bilirubin:      - Related to alcohol as well as rhabdo.     - Right upper quadrant ultrasound showed hepatic steatosis with early cirrhosis, with some gallbladder thickening.     - trend LFTs; improving  Alcohol abuse:      - thiamine, multivitamin, folic acid.  UTI      - s/p 5 days Rocephin.  Hypertension:      - metoprolol  Severe protein-cal malnutrition      - nutrition support.  Hypothermia:      - WBC normal no evidence of infection.       - TSH normal.      - Resolved.   Abdominal pain, back pain:     - Resolved.      - X-ray showed unchanged T-11 compression fracture, age indeterminate L  1 fracture.      - carafate, protonix  No acute events ON per nursing. Unsafe discharge d/t mentation, debility. Needs placement.  DVT prophylaxis: lovenox Code Status: FULL   Disposition Plan: TBD  Subjective: No acute events ON per nursing.  Objective: Vitals:   10/02/19 2111 10/03/19 0526 10/03/19 0648 10/03/19 1342  BP: 131/82 97/70  107/87  Pulse: 73 72  76  Resp: 16 15  17   Temp: 98.1 F (36.7 C) 98 F (36.7 C)  98.1 F (36.7 C)  TempSrc: Oral Oral  Oral  SpO2: 100% 97%  99%  Weight:   67.7 kg   Height:        Intake/Output Summary (Last 24 hours) at 10/03/2019 1504 Last data filed at 10/03/2019 0900 Gross per 24 hour  Intake 540 ml  Output 300 ml  Net 240 ml   Filed Weights   09/30/19 0356 10/02/19 0545 10/03/19 0648  Weight: 66 kg 66.5 kg 67.7 kg    Examination:  General: 48 y.o. male  resting in bed in NAD Cardiovascular: RRR, +S1, S2, no m/g/r, equal pulses throughout Respiratory: CTABL, no w/r/r, normal WOB GI: BS+, NDNT, no masses noted, no organomegaly noted MSK: No e/c/c Neuro: somnolent   Data Reviewed: I have personally reviewed following labs and imaging studies.  CBC: Recent Labs  Lab 09/27/19 0836  WBC 8.1  HGB 14.0  HCT 41.6  MCV 93.7  PLT 239   Basic Metabolic Panel: Recent Labs  Lab 09/27/19 0836  NA 141  K 4.0  CL 107  CO2 25  GLUCOSE 86  BUN 29*  CREATININE 1.22  CALCIUM 9.2   GFR: Estimated Creatinine Clearance: 69.4 mL/min (by C-G formula based on SCr of 1.22 mg/dL). Liver Function Tests: No results for input(s): AST, ALT, ALKPHOS, BILITOT, PROT, ALBUMIN in the last 168 hours. No results for input(s): LIPASE, AMYLASE in the last 168 hours. No results for input(s): AMMONIA in the last 168 hours. Coagulation Profile: No results for input(s): INR, PROTIME in the last 168 hours. Cardiac Enzymes: No results for input(s): CKTOTAL, CKMB, CKMBINDEX, TROPONINI in the last 168 hours. BNP (last 3 results) No results for input(s): PROBNP in the last 8760 hours. HbA1C: No results for input(s): HGBA1C in the last 72 hours. CBG: Recent Labs  Lab 09/29/19 2118  GLUCAP 131*   Lipid Profile: No results for input(s): CHOL, HDL, LDLCALC, TRIG, CHOLHDL, LDLDIRECT in the last 72 hours. Thyroid Function Tests: No results for input(s): TSH, T4TOTAL, FREET4, T3FREE, THYROIDAB in the last 72 hours. Anemia Panel: No results for input(s): VITAMINB12, FOLATE, FERRITIN, TIBC, IRON, RETICCTPCT in the last 72 hours. Sepsis Labs: No results for input(s): PROCALCITON, LATICACIDVEN in the last 168 hours.  No results found for this or any previous visit (from the past 240 hour(s)).    Radiology Studies: No results found.   Scheduled Meds: .  stroke: mapping our early stages of recovery book   Does not apply Once  . aspirin  325 mg Oral Daily  .  enoxaparin (LOVENOX) injection  40 mg Subcutaneous Q24H  . feeding supplement (ENSURE ENLIVE)  237 mL Oral BID BM  . folic acid  1 mg Oral Daily  . lactulose  20 g Oral BID  . metoprolol tartrate  25 mg Oral BID  . multivitamin with minerals  1 tablet Oral Daily  . omega-3 acid ethyl esters  1 g Oral Daily  . pantoprazole  40 mg Oral Daily  . sucralfate  1 g Oral TID WC & HS  . thiamine  100 mg Oral Daily   Or  . thiamine  100 mg Intravenous Daily   Continuous Infusions:   LOS: 72 days    Time spent: 25 minutes spent in the coordination of care today.    Jonnie Finner, DO [facility] Pager [phone]  If 7PM-7AM, please contact night-coverage [email or web address] Password [email or web address] 10/03/2019, 3:04 PM

## 2019-10-04 DIAGNOSIS — M6282 Rhabdomyolysis: Secondary | ICD-10-CM | POA: Diagnosis not present

## 2019-10-04 NOTE — TOC Progression Note (Signed)
Transition of Care [facility]) - Progression Note    Patient Details  Name: Tyler [name] MRN: [id number] Date of Birth: [birth year]  Transition of Care [facility]) CM/SW [place], [place] Phone Number: 10/04/2019, 8:55 AM  Clinical Narrative:    Financial Counseling notified CSW that patient's Medicaid has been approved: [id number] [place] asked [facility] locations to review patient again and will re-send out to all SNFs in the hope that Medicaid will help placement options.    Expected Discharge Plan: Skilled Nursing Facility Barriers to Discharge: SNF Pending Medicaid, SNF Pending payor source - LOG, Active Substance Use - Placement  Expected Discharge Plan and Services Expected Discharge Plan: [place]   Discharge Planning Services: CM Consult, Other - See comment(Pt will need LOG for SNF placement) Post Acute Care Choice: [place] arrangements for the past 2 months: Single Family Home                 DME Arranged: N/A           HH Agency: NA         Social Determinants of Health (SDOH) Interventions    Readmission Risk Interventions No flowsheet data found.

## 2019-10-04 NOTE — Progress Notes (Signed)
Marland Kitchen.  PROGRESS NOTE     Tyler [name]  WUJ:[id number] DOB: 07/24/1971 DOA: 07/23/2019 PCP: Patient, No Pcp Per   Brief Narrative:   48 year old with past medical history significant for polysubstance abuse including heroine presented to [facility] ER after being found down with a syringe nearby. He was given Narcan x2 with minimal improvement. On presentation he was found to have severe rhabdomyolysis CPK 72,000, AST 2000 ALT 1000 with acute kidney injury BUN 40 creatinine 2.4 and hyperkalemia potassium 6.2. He was a started on IV fluids, bicarb drip and transferred to [facility] for further care. Initial leukocytosis of 21K, thought to be related to hemoconcentration. UDS negative. Salicylate and acetaminophen negative. CT head and neck were negative for acute traumatic finding. Patient admitted to hospitalist service for rhabdomyolysis, AKI. Rhabdomyolysis has improved, he continued to have persistent encephalopathy and was found to have acute bilateral cerebellar infarct. Neurology was consulted and has subsequently signed off. PT is recommending SNF. Prolonged hospitalization due to placement issues.  10/28: No acute events ON per nursing. 10/29: Denies complaints this morning. Says he's ready to eat breakfast   Assessment & Plan:   Principal Problem:   Rhabdomyolysis Active Problems:   Acute kidney failure (HCC)   Acute metabolic encephalopathy   Polysubstance abuse (HCC)   Hyperkalemia   Transaminitis   HTN (hypertension)   Cerebral embolism with cerebral infarction   Protein-calorie malnutrition, severe    Acute bilateral ischemic CVA:      - MRI of the brain show acute CVA.       - MRA negative.       - Ultrasound carotid showed bilateral 1 to 39% ICA stenosis.       - Hemoglobin A1c 5.1.       - Echo showed normal ejection fraction.       - Lower extremity Dopplers negative for DVT.     - Patient was evaluated by neurology who recommend 30-day cardiac monitor  events.     - statin held d/t elevated LFT; trend LFTs (improving)   Acute metabolic encephalopathy, POA, multifactorial:     - likely secondary to CVA in the setting of alcohol drug abuse.       - UDS was negative Tylenol and salicylate are negative.       - TSH and B12 normal limits.       - The MRI is diffuse slowing but no seizures activity.     - per nursing mentation waxes/wanes; will need placement  Severe rhabdomyolysis AKI     - Resolved with IV fluids.       - Renal ultrasound negative.  Transaminases with elevated bilirubin:      - Related to alcohol as well as rhabdo.     - Right upper quadrant ultrasound showed hepatic steatosis with early cirrhosis, with some gallbladder thickening.     - trend LFTs; improving  Alcohol abuse:      - thiamine, multivitamin, folic acid.  UTI      - s/p 5 days Rocephin.  Hypertension:      - metoprolol  Severe protein-cal malnutrition      - nutrition support.  Hypothermia:      - WBC normal no evidence of infection.       - TSH normal.      - Resolved.   Abdominal pain, back pain:     - Resolved.      - X-ray showed unchanged T-11 compression fracture, age indeterminate  L 1 fracture.      - carafate, protonix     - 10/29: denies complaints today; ready to eat  More alert this morning. Denies complaints. AM labs ordered. Will need placement d/t mentation. Continue as above.  DVT prophylaxis: lovenox Code Status: FULL   Disposition Plan: TBD  Subjective: "I can't think of anything."  Objective: Vitals:   10/03/19 2136 10/04/19 0526 10/04/19 0940 10/04/19 1311  BP: 100/70 111/81 (!) 159/95 99/84  Pulse: 74 61 80 64  Resp: 17 17  16   Temp: 98.5 F (36.9 C) 97.9 F (36.6 C)  98.6 F (37 C)  TempSrc: Oral Oral  Oral  SpO2: 100% 97%  98%  Weight:      Height:        Intake/Output Summary (Last 24 hours) at 10/04/2019 1524 Last data filed at 10/04/2019 0851 Gross per 24 hour  Intake -  Output 350 ml   Net -350 ml   Filed Weights   09/30/19 0356 10/02/19 0545 10/03/19 0648  Weight: 66 kg 66.5 kg 67.7 kg    Examination:  General: 48 y.o. male resting in bed in NAD Eyes: PERRL, normal sclera ENMT: Nares patent w/o discharge, orophaynx clear, dentition normal, ears w/o discharge/lesions/ulcers Cardiovascular: RRR, +S1, S2, no m/g/r Respiratory: CTABL, no w/r/r GI: BS+, NDNT, no masses noted, no organomegaly noted Neuro: alert to name, follows commands   Data Reviewed: I have personally reviewed following labs and imaging studies.  CBC: No results for input(s): WBC, NEUTROABS, HGB, HCT, MCV, PLT in the last 168 hours. Basic Metabolic Panel: No results for input(s): NA, K, CL, CO2, GLUCOSE, BUN, CREATININE, CALCIUM, MG, PHOS in the last 168 hours. GFR: Estimated Creatinine Clearance: 69.4 mL/min (by C-G formula based on SCr of 1.22 mg/dL). Liver Function Tests: No results for input(s): AST, ALT, ALKPHOS, BILITOT, PROT, ALBUMIN in the last 168 hours. No results for input(s): LIPASE, AMYLASE in the last 168 hours. No results for input(s): AMMONIA in the last 168 hours. Coagulation Profile: No results for input(s): INR, PROTIME in the last 168 hours. Cardiac Enzymes: No results for input(s): CKTOTAL, CKMB, CKMBINDEX, TROPONINI in the last 168 hours. BNP (last 3 results) No results for input(s): PROBNP in the last 8760 hours. HbA1C: No results for input(s): HGBA1C in the last 72 hours. CBG: Recent Labs  Lab 09/29/19 2118  GLUCAP 131*   Lipid Profile: No results for input(s): CHOL, HDL, LDLCALC, TRIG, CHOLHDL, LDLDIRECT in the last 72 hours. Thyroid Function Tests: No results for input(s): TSH, T4TOTAL, FREET4, T3FREE, THYROIDAB in the last 72 hours. Anemia Panel: No results for input(s): VITAMINB12, FOLATE, FERRITIN, TIBC, IRON, RETICCTPCT in the last 72 hours. Sepsis Labs: No results for input(s): PROCALCITON, LATICACIDVEN in the last 168 hours.  No results found for  this or any previous visit (from the past 240 hour(s)).    Radiology Studies: No results found.   Scheduled Meds: .  stroke: mapping our early stages of recovery book   Does not apply Once  . aspirin  325 mg Oral Daily  . enoxaparin (LOVENOX) injection  40 mg Subcutaneous Q24H  . feeding supplement (ENSURE ENLIVE)  237 mL Oral BID BM  . folic acid  1 mg Oral Daily  . lactulose  20 g Oral BID  . metoprolol tartrate  25 mg Oral BID  . multivitamin with minerals  1 tablet Oral Daily  . omega-3 acid ethyl esters  1 g Oral Daily  . pantoprazole  40  mg Oral Daily  . sucralfate  1 g Oral TID WC & HS  . thiamine  100 mg Oral Daily   Or  . thiamine  100 mg Intravenous Daily   Continuous Infusions:   LOS: 73 days    Time spent: 15 minutes spent in the coordination of care today.    Teddy Spike, DO [facility] Pager [phone]  If 7PM-7AM, please contact night-coverage [email or web address] Password [email or web address] 10/04/2019, 3:24 PM

## 2019-10-04 NOTE — Plan of Care (Signed)
  Problem: Activity: Goal: Risk for activity intolerance will decrease Outcome: Progressing   Problem: Nutrition: Goal: Risk of aspiration will decrease Outcome: Progressing   

## 2019-10-04 NOTE — Progress Notes (Signed)
Physical Therapy Treatment Patient Details Name: Tyler [name] MRN: [id number] DOB: 05-23-1971 Today's Date: 10/04/2019    History of Present Illness 48 y/o male transferred from [place] for unresponsiveness, likely secondary to substance abuse. Pt with rhabdomyolysis and AKI. EEG revealed moderate diffuse encephalopathy. PMH includes polysubstance abuse and HTN. Imaging show acute bialteral cerebellar infarcts.     PT Comments    Patient received sleeping in bed. Lethargic. Agrees to walk. Patient continues to have decreased awareness, decreased orientation, decreased safety with mobility. Easily confused. Requires supervision with mobility due to cognitive limitations. Patient able to ambulate with supervision, min guard.     Follow Up Recommendations  Supervision for mobility/OOB;SNF     Equipment Recommendations  None recommended by PT    Recommendations for Other Services       Precautions / Restrictions Precautions Precautions: Fall Restrictions Weight Bearing Restrictions: No    Mobility  Bed Mobility Overal bed mobility: Needs Assistance Bed Mobility: Supine to Sit     Supine to sit: Supervision        Transfers Overall transfer level: Needs assistance Equipment used: None Transfers: Sit to/from Stand Sit to Stand: Supervision         General transfer comment: sup/min guard safety  Ambulation/Gait Ambulation/Gait assistance: Min guard;Supervision Gait Distance (Feet): 300 Feet Assistive device: None Gait Pattern/deviations: Drifts right/left Gait velocity: slightly decreased   General Gait Details: close supervision, tactile and verbal cues for obstacle avoidance in hallway, Unaware of environment-easily confused, disoriented.   Stairs             Wheelchair Mobility    Modified Rankin (Stroke Patients Only) Modified Rankin (Stroke Patients Only) Pre-Morbid Rankin Score: No symptoms Modified Rankin: Moderately severe  disability     Balance Overall balance assessment: Needs assistance Sitting-balance support: Feet supported Sitting balance-Leahy Scale: Normal     Standing balance support: During functional activity Standing balance-Leahy Scale: Fair                              Cognition Arousal/Alertness: Awake/alert;Lethargic Behavior During Therapy: WFL for tasks assessed/performed Overall Cognitive Status: Impaired/Different from baseline Area of Impairment: Orientation;Awareness;Attention;Problem solving;Memory;Following commands;Safety/judgement                 Orientation Level: Disoriented to;Place;Time;Situation Current Attention Level: Selective Memory: Decreased recall of precautions;Decreased short-term memory Following Commands: Follows one step commands inconsistently;Follows one step commands with increased time Safety/Judgement: Decreased awareness of safety;Decreased awareness of deficits Awareness: Intellectual Problem Solving: Slow processing;Requires verbal cues;Requires tactile cues General Comments: Very poor awareness, difficulty processing.      Exercises      General Comments        Pertinent Vitals/Pain      Home Living                      Prior Function            PT Goals (current goals can now be found in the care plan section) Acute Rehab PT Goals Patient Stated Goal: not stated PT Goal Formulation: Patient unable to participate in goal setting Time For Goal Achievement: 10/19/19 Potential to Achieve Goals: Fair Progress towards PT goals: Progressing toward goals    Frequency    Min 2X/week      PT Plan Current plan remains appropriate    Co-evaluation  AM-PAC PT "6 Clicks" Mobility   Outcome Measure  Help needed turning from your back to your side while in a flat bed without using bedrails?: None Help needed moving from lying on your back to sitting on the side of a flat bed without  using bedrails?: None Help needed moving to and from a bed to a chair (including a wheelchair)?: A Little Help needed standing up from a chair using your arms (e.g., wheelchair or bedside chair)?: A Little Help needed to walk in hospital room?: A Little Help needed climbing 3-5 steps with a railing? : A Little 6 Click Score: 20    End of Session Equipment Utilized During Treatment: Gait belt Activity Tolerance: Patient tolerated treatment well Patient left: in bed;with call bell/phone within reach;with bed alarm set Nurse Communication: Mobility status PT Visit Diagnosis: Unsteadiness on feet (R26.81);Difficulty in walking, not elsewhere classified (R26.2);Other symptoms and signs involving the nervous system (R29.898)     Time: 1220-1240 PT Time Calculation (min) (ACUTE ONLY): 20 min  Charges:  $Gait Training: 8-22 mins                     Allisyn Kunz, PT, GCS 10/04/19,1:06 PM

## 2019-10-05 DIAGNOSIS — M6282 Rhabdomyolysis: Secondary | ICD-10-CM | POA: Diagnosis not present

## 2019-10-05 LAB — CBC WITH DIFFERENTIAL/PLATELET
Abs Immature Granulocytes: 0.05 10*3/uL (ref 0.00–0.07)
Basophils Absolute: 0.1 10*3/uL (ref 0.0–0.1)
Basophils Relative: 1 %
Eosinophils Absolute: 0.5 10*3/uL (ref 0.0–0.5)
Eosinophils Relative: 6 %
HCT: 40.8 % (ref 39.0–52.0)
Hemoglobin: 13.4 g/dL (ref 13.0–17.0)
Immature Granulocytes: 1 %
Lymphocytes Relative: 30 %
Lymphs Abs: 2.3 10*3/uL (ref 0.7–4.0)
MCH: 30.9 pg (ref 26.0–34.0)
MCHC: 32.8 g/dL (ref 30.0–36.0)
MCV: 94 fL (ref 80.0–100.0)
Monocytes Absolute: 1 10*3/uL (ref 0.1–1.0)
Monocytes Relative: 13 %
Neutro Abs: 3.9 10*3/uL (ref 1.7–7.7)
Neutrophils Relative %: 49 %
Platelets: 233 10*3/uL (ref 150–400)
RBC: 4.34 MIL/uL (ref 4.22–5.81)
RDW: 12.5 % (ref 11.5–15.5)
WBC: 7.7 10*3/uL (ref 4.0–10.5)
nRBC: 0 % (ref 0.0–0.2)

## 2019-10-05 LAB — COMPREHENSIVE METABOLIC PANEL
ALT: 48 U/L — ABNORMAL HIGH (ref 0–44)
AST: 37 U/L (ref 15–41)
Albumin: 3.3 g/dL — ABNORMAL LOW (ref 3.5–5.0)
Alkaline Phosphatase: 61 U/L (ref 38–126)
Anion gap: 10 (ref 5–15)
BUN: 25 mg/dL — ABNORMAL HIGH (ref 6–20)
CO2: 23 mmol/L (ref 22–32)
Calcium: 9.2 mg/dL (ref 8.9–10.3)
Chloride: 109 mmol/L (ref 98–111)
Creatinine, Ser: 1.24 mg/dL (ref 0.61–1.24)
GFR calc Af Amer: >60 mL/min (ref >60)
GFR calc non Af Amer: >60 mL/min (ref >60)
Glucose, Bld: 85 mg/dL (ref 70–99)
Potassium: 4 mmol/L (ref 3.5–5.1)
Sodium: 142 mmol/L (ref 135–145)
Total Bilirubin: 0.7 mg/dL (ref 0.3–1.2)
Total Protein: 7 g/dL (ref 6.5–8.1)

## 2019-10-05 LAB — MAGNESIUM: Magnesium: 1.9 mg/dL (ref 1.7–2.4)

## 2019-10-05 NOTE — Progress Notes (Signed)
Marland Kitchen  PROGRESS NOTE    Tyler [name]  ERD:[id number] DOB: 1971/03/13 DOA: 07/23/2019 PCP: Patient, No Pcp Per   Brief Narrative:   48 year old with past medical history significant for polysubstance abuse including heroine presented to [facility] ER after being found down with a syringe nearby. He was given Narcan x2 with minimal improvement. On presentation he was found to have severe rhabdomyolysis CPK 72,000, AST 2000 ALT 1000 with acute kidney injury BUN 40 creatinine 2.4 and hyperkalemia potassium 6.2. He was a started on IV fluids, bicarb drip and transferred to [facility] for further care. Initial leukocytosis of 21K, thought to be related to hemoconcentration. UDS negative. Salicylate and acetaminophen negative. CT head and neck were negative for acute traumatic finding. Patient admitted to hospitalist service for rhabdomyolysis, AKI. Rhabdomyolysis has improved, he continued to have persistent encephalopathy and was found to have acute bilateral cerebellar infarct. Neurology was consulted and has subsequently signed off. PT is recommending SNF. Prolonged hospitalization due to placement issues.  10/28: No acute events ON per nursing. 10/29: Denies complaints this morning. Says he's ready to eat breakfast 10/30: Denies complaints. No acute events ON.    Assessment & Plan:   Principal Problem:   Rhabdomyolysis Active Problems:   Acute kidney failure (HCC)   Acute metabolic encephalopathy   Polysubstance abuse (HCC)   Hyperkalemia   Transaminitis   HTN (hypertension)   Cerebral embolism with cerebral infarction   Protein-calorie malnutrition, severe   Acute bilateral ischemic CVA:  - MRI of the brain show acute CVA.  - MRA negative.  - Ultrasound carotid showed bilateral 1 to 39% ICA stenosis.  - Hemoglobin A1c 5.1.  - Echo showed normal ejection fraction.  - Lower extremity Dopplers negative for DVT. - Patient was evaluated by  neurology who recommend 30-day cardiac monitor events. - statin held d/t elevated LFT; trend LFTs (improving)   Acute metabolic encephalopathy, POA, multifactorial: - likely secondary to CVA in the setting of alcohol drug abuse.  - UDS was negative Tylenol and salicylate are negative.  - TSH and B12 normal limits.  - The MRI is diffuse slowing but no seizures activity. - per nursing mentation waxes/wanes; will need placement  Severe rhabdomyolysis AKI - Resolved with IV fluids.  - Renal ultrasound negative.  Transaminases with elevated bilirubin:  - Related to alcohol as well as rhabdo. - Right upper quadrant ultrasound showed hepatic steatosis with early cirrhosis, with some gallbladder thickening. - trend LFTs; improving     - 10/30: resolved  Alcohol abuse:  - thiamine, multivitamin, folic acid.  UTI  - s/p 5 days Rocephin.  Hypertension:  - metoprolol     - 10/30: BP accceptable  Severe protein-cal malnutrition  - nutrition support.  Hypothermia:  - WBC normal no evidence of infection.  - TSH normal.  - Resolved.   Abdominal pain, back pain: - Resolved.  - X-ray showed unchanged T-11 compression fracture, age indeterminate L 1 fracture.      - carafate, protonix     - 10/29: denies complaints today; ready to eat     - 10/30: resolved.  Unsafe discharge to self d/t mentation/capacity. Working on placement.   DVT prophylaxis: lovenox Code Status: FULL   Disposition Plan: TBD  Subjective: "That's just what they have on sometimes."  Objective: Vitals:   10/04/19 1311 10/05/19 0549 10/05/19 1206 10/05/19 1209  BP: 99/84 (!) 129/99 (!) 138/119 108/77  Pulse: 64 75 77 75  Resp: 16  17  Temp: 98.6 F (37 C) 98.3 F (36.8 C) 98.2 F (36.8 C)   TempSrc: Oral  Oral   SpO2: 98% 98% 98%   Weight:  82.1 kg    Height:        Intake/Output Summary (Last 24 hours)  at 10/05/2019 1456 Last data filed at 10/05/2019 1313 Gross per 24 hour  Intake 1196 ml  Output -  Net 1196 ml   Filed Weights   10/02/19 0545 10/03/19 0648 10/05/19 0549  Weight: 66.5 kg 67.7 kg 82.1 kg    Examination:  General: 48 y.o. male resting in bed in NAD Eyes: PERRL, normal sclera Cardiovascular: RRR, +S1, S2, no m/g/r Respiratory: CTABL, no w/r/r, normal WOB GI: BS+, NDNT, no masses noted, no organomegaly noted MSK: No e/c/c Neuro: alert to name, follows commands Psyc: Appropriate interaction and affect, calm/cooperative   Data Reviewed: I have personally reviewed following labs and imaging studies.  CBC: Recent Labs  Lab 10/05/19 0301  WBC 7.7  NEUTROABS 3.9  HGB 13.4  HCT 40.8  MCV 94.0  PLT 233   Basic Metabolic Panel: Recent Labs  Lab 10/05/19 0301  NA 142  K 4.0  CL 109  CO2 23  GLUCOSE 85  BUN 25*  CREATININE 1.24  CALCIUM 9.2  MG 1.9   GFR: Estimated Creatinine Clearance: 74.5 mL/min (by C-G formula based on SCr of 1.24 mg/dL). Liver Function Tests: Recent Labs  Lab 10/05/19 0301  AST 37  ALT 48*  ALKPHOS 61  BILITOT 0.7  PROT 7.0  ALBUMIN 3.3*   No results for input(s): LIPASE, AMYLASE in the last 168 hours. No results for input(s): AMMONIA in the last 168 hours. Coagulation Profile: No results for input(s): INR, PROTIME in the last 168 hours. Cardiac Enzymes: No results for input(s): CKTOTAL, CKMB, CKMBINDEX, TROPONINI in the last 168 hours. BNP (last 3 results) No results for input(s): PROBNP in the last 8760 hours. HbA1C: No results for input(s): HGBA1C in the last 72 hours. CBG: Recent Labs  Lab 09/29/19 2118  GLUCAP 131*   Lipid Profile: No results for input(s): CHOL, HDL, LDLCALC, TRIG, CHOLHDL, LDLDIRECT in the last 72 hours. Thyroid Function Tests: No results for input(s): TSH, T4TOTAL, FREET4, T3FREE, THYROIDAB in the last 72 hours. Anemia Panel: No results for input(s): VITAMINB12, FOLATE, FERRITIN,  TIBC, IRON, RETICCTPCT in the last 72 hours. Sepsis Labs: No results for input(s): PROCALCITON, LATICACIDVEN in the last 168 hours.  No results found for this or any previous visit (from the past 240 hour(s)).    Radiology Studies: No results found.   Scheduled Meds: .  stroke: mapping our early stages of recovery book   Does not apply Once  . aspirin  325 mg Oral Daily  . enoxaparin (LOVENOX) injection  40 mg Subcutaneous Q24H  . feeding supplement (ENSURE ENLIVE)  237 mL Oral BID BM  . folic acid  1 mg Oral Daily  . lactulose  20 g Oral BID  . metoprolol tartrate  25 mg Oral BID  . multivitamin with minerals  1 tablet Oral Daily  . omega-3 acid ethyl esters  1 g Oral Daily  . pantoprazole  40 mg Oral Daily  . sucralfate  1 g Oral TID WC & HS  . thiamine  100 mg Oral Daily   Or  . thiamine  100 mg Intravenous Daily   Continuous Infusions:   LOS: 74 days    Time spent: 15 minutes spent in the coordination of care  today.    Jonnie Finner, DO [facility] Pager [phone]  If 7PM-7AM, please contact night-coverage [email or web address] Password [email or web address] 10/05/2019, 2:56 PM

## 2019-10-05 NOTE — Progress Notes (Signed)
Nutrition Follow-up  DOCUMENTATION CODES:   Severe malnutrition in context of acute illness/injury  INTERVENTION:   -Continue MVI with minerals daily -ContinueMagic cup TID with meals, each supplement provides 290 kcal and 9 grams of protein -ContinueEnsure Enlive po BID, each supplement provides 350 kcal and 20 grams of protein  NUTRITION DIAGNOSIS:   Severe Malnutrition related to acute illness(acute toxic/metabolic encephalopathy) as evidenced by moderate fat depletion, severe fat depletion, moderate muscle depletion, severe muscle depletion, energy intake < or equal to 50% for > or equal to 5 days, percent weight loss.  Ongoing  GOAL:   Patient will meet greater than or equal to 90% of their needs  Progressing   MONITOR:   PO intake, Supplement acceptance, Labs, Weight trends, Skin, I & O's  REASON FOR ASSESSMENT:   LOS    ASSESSMENT:   Tyler [name] is a 48 y.o. male with medical history significant of polysubstance abuse including heroin.  Brought in to [facility] ED just before MN after being found down by friend last evening with nearby syringe.  Reviewed I/O's: +760 ml x 24 hours and +7.9 L since 09/21/19  UOP: 200 ml x 24 hours  Pt remains with good appetite. Noted meal completion 25-100%. Pt remains compliant with Ensure supplements.  Medications reviewed and include lactulose.    Per CSW notes, pt remains difficult to place. Awaiting SNF placementfor discharge.Pt recently received Medicaid, so hopeful for placement soon.   Labs reviewed: CBGS: 131.   Diet Order:   Diet Order            DIET DYS 3 Room service appropriate? No; Fluid consistency: Thin  Diet effective now              EDUCATION NEEDS:   Not appropriate for education at this time  Skin:  Skin Assessment: Reviewed RN Assessment  Last BM:  10/04/19  Height:   Ht Readings from Last 1 Encounters:  08/09/19 5\' 8"  (1.727 m)    Weight:   Wt Readings from Last 1 Encounters:   10/05/19 82.1 kg    Ideal Body Weight:  70 kg  BMI:  Body mass index is 27.52 kg/m.  Estimated Nutritional Needs:   Kcal:  2000-2200  Protein:  105-120 grams  Fluid:  > 2 L    Sophie Quiles A. Jimmye Norman, RD, LDN, [place] Registered Dietitian II Certified Diabetes Care and Education Specialist Pager: [phone] After hours Pager: [phone]

## 2019-10-06 DIAGNOSIS — M6282 Rhabdomyolysis: Secondary | ICD-10-CM | POA: Diagnosis not present

## 2019-10-06 MED ORDER — ATORVASTATIN CALCIUM 10 MG PO TABS
20.0000 mg | ORAL_TABLET | Freq: Every day | ORAL | Status: DC
  Administered 2019-10-06 – 2019-12-07 (×51): 20 mg via ORAL
  Filled 2019-10-06 (×57): qty 2

## 2019-10-06 NOTE — Progress Notes (Signed)
Marland Kitchen  PROGRESS NOTE    Tyler [name]  BOF:[id number] DOB: 1971-06-21 DOA: 07/23/2019 PCP: Patient, No Pcp Per   Brief Narrative:   48 year old with past medical history significant for polysubstance abuse including heroine presented to [facility] ER after being found down with a syringe nearby. He was given Narcan x2 with minimal improvement. On presentation he was found to have severe rhabdomyolysis CPK 72,000, AST 2000 ALT 1000 with acute kidney injury BUN 40 creatinine 2.4 and hyperkalemia potassium 6.2. He was a started on IV fluids, bicarb drip and transferred to [facility] for further care. Initial leukocytosis of 21K, thought to be related to hemoconcentration. UDS negative. Salicylate and acetaminophen negative. CT head and neck were negative for acute traumatic finding. Patient admitted to hospitalist service for rhabdomyolysis, AKI. Rhabdomyolysis has improved, he continued to have persistent encephalopathy and was found to have acute bilateral cerebellar infarct. Neurology was consulted and has subsequently signed off. PT is recommending SNF. Prolonged hospitalization due to placement issues.  10/28: No acute events ON per nursing. 10/29: Denies complaints this morning. Says he's ready to eat breakfast 10/30: Denies complaints. No acute events ON.  10/31: Sleepy this AM. Says he's ok.   Assessment & Plan:   Principal Problem:   Rhabdomyolysis Active Problems:   Acute kidney failure (HCC)   Acute metabolic encephalopathy   Polysubstance abuse (HCC)   Hyperkalemia   Transaminitis   HTN (hypertension)   Cerebral embolism with cerebral infarction   Protein-calorie malnutrition, severe  Acute bilateral ischemic CVA:  - MRI of the brain show acute CVA.  - MRA negative.  - Ultrasound carotid showed bilateral 1 to 39% ICA stenosis.  - Hemoglobin A1c 5.1.  - Echo showed normal ejection fraction.  - Lower extremity Dopplers negative for DVT.  - Patient was evaluated by neurology who recommend 30-day cardiac monitor events. - statin held d/t elevated LFT; trend LFTs (improving)   Acute metabolic encephalopathy, POA, multifactorial: - likely secondary to CVA in the setting of alcohol drug abuse.  - UDS was negative Tylenol and salicylate are negative.  - TSH and B12 normal limits.  - The MRI is diffuse slowing but no seizures activity. - per nursing mentation waxes/wanes; will need placement  Severe rhabdomyolysis AKI - Resolved with IV fluids.  - Renal ultrasound negative.  Transaminases with elevated bilirubin:  - Related to alcohol as well as rhabdo. - Right upper quadrant ultrasound showed hepatic steatosis with early cirrhosis, with some gallbladder thickening. - trend LFTs; improving     - 10/30: resolved     - 10/31: can resume statin tonight; watch LFTs  Alcohol abuse:  - thiamine, multivitamin, folic acid.  UTI  - s/p 5 days Rocephin.  Hypertension:  - metoprolol     - 10/30: BP accceptable     - 10/31: BP is ok this AM, monitor  Severe protein-cal malnutrition  - nutrition support.  Hypothermia:  - WBC normal no evidence of infection.  - TSH normal.  - Resolved.   Abdominal pain, back pain: - Resolved.  - X-ray showed unchanged T-11 compression fracture, age indeterminate L-1 fracture. - carafate, protonix - 10/29: denies complaints today; ready to eat     - 10/30: resolved.  Difficult to discharge to self d/t mentation/capacity. Working on placement.   DVT prophylaxis: lovenox Code Status: FULL   Disposition Plan: TBD  Subjective: Denis complaints this AM  Objective: Vitals:   10/05/19 2117 10/05/19 2128 10/06/19 0408 10/06/19 0447  BP: Marland Kitchen)  122/97 (!) 122/97  111/78  Pulse: 83 83  (!) 59  Resp: 18   18  Temp: 98.4 F (36.9 C)   98.1 F (36.7 C)  TempSrc: Oral   Oral  SpO2:  98%   98%  Weight:   66 kg   Height:        Intake/Output Summary (Last 24 hours) at 10/06/2019 1054 Last data filed at 10/05/2019 1700 Gross per 24 hour  Intake 716 ml  Output -  Net 716 ml   Filed Weights   10/03/19 0648 10/05/19 0549 10/06/19 0408  Weight: 67.7 kg 82.1 kg 66 kg    Examination:  General: 48 y.o. male resting in bed in NAD Cardiovascular: RRR, +S1, S2, no m/g/r, equal pulses throughout Respiratory: CTABL, no w/r/r GI: BS+, NDNT MSK: No e/c/c Neuro: Alert to name, follows commands  Data Reviewed: I have personally reviewed following labs and imaging studies.  CBC: Recent Labs  Lab 10/05/19 0301  WBC 7.7  NEUTROABS 3.9  HGB 13.4  HCT 40.8  MCV 94.0  PLT 233   Basic Metabolic Panel: Recent Labs  Lab 10/05/19 0301  NA 142  K 4.0  CL 109  CO2 23  GLUCOSE 85  BUN 25*  CREATININE 1.24  CALCIUM 9.2  MG 1.9   GFR: Estimated Creatinine Clearance: 66.5 mL/min (by C-G formula based on SCr of 1.24 mg/dL). Liver Function Tests: Recent Labs  Lab 10/05/19 0301  AST 37  ALT 48*  ALKPHOS 61  BILITOT 0.7  PROT 7.0  ALBUMIN 3.3*   No results for input(s): LIPASE, AMYLASE in the last 168 hours. No results for input(s): AMMONIA in the last 168 hours. Coagulation Profile: No results for input(s): INR, PROTIME in the last 168 hours. Cardiac Enzymes: No results for input(s): CKTOTAL, CKMB, CKMBINDEX, TROPONINI in the last 168 hours. BNP (last 3 results) No results for input(s): PROBNP in the last 8760 hours. HbA1C: No results for input(s): HGBA1C in the last 72 hours. CBG: Recent Labs  Lab 09/29/19 2118  GLUCAP 131*   Lipid Profile: No results for input(s): CHOL, HDL, LDLCALC, TRIG, CHOLHDL, LDLDIRECT in the last 72 hours. Thyroid Function Tests: No results for input(s): TSH, T4TOTAL, FREET4, T3FREE, THYROIDAB in the last 72 hours. Anemia Panel: No results for input(s): VITAMINB12, FOLATE, FERRITIN, TIBC, IRON, RETICCTPCT in the last 72  hours. Sepsis Labs: No results for input(s): PROCALCITON, LATICACIDVEN in the last 168 hours.  No results found for this or any previous visit (from the past 240 hour(s)).    Radiology Studies: No results found.   Scheduled Meds: .  stroke: mapping our early stages of recovery book   Does not apply Once  . aspirin  325 mg Oral Daily  . enoxaparin (LOVENOX) injection  40 mg Subcutaneous Q24H  . feeding supplement (ENSURE ENLIVE)  237 mL Oral BID BM  . folic acid  1 mg Oral Daily  . lactulose  20 g Oral BID  . metoprolol tartrate  25 mg Oral BID  . multivitamin with minerals  1 tablet Oral Daily  . omega-3 acid ethyl esters  1 g Oral Daily  . pantoprazole  40 mg Oral Daily  . sucralfate  1 g Oral TID WC & HS  . thiamine  100 mg Oral Daily   Or  . thiamine  100 mg Intravenous Daily   Continuous Infusions:   LOS: 75 days    Time spent: 15 minutes spent in the coordination of care  today.   Teddy Spike, DO [facility] Pager [phone]  If 7PM-7AM, please contact night-coverage [email or web address] Password [email or web address] 10/06/2019, 10:54 AM

## 2019-10-06 NOTE — Progress Notes (Signed)
Occupational Therapy Treatment Patient Details Name: Tyler [name] MRN: [id number] DOB: 1971-02-22 Today's Date: 10/06/2019    History of present illness 48 y/o male transferred from [facility] for unresponsiveness, likely secondary to substance abuse. Pt with rhabdomyolysis and AKI. EEG revealed moderate diffuse encephalopathy. PMH includes polysubstance abuse and HTN. Imaging show acute bialteral cerebellar infarcts.    OT comments  Pt making steady progress towards OT goals this session. Session focus on functional mobility and standing grooming. Pt continues to be limited by cognitive deficits requiring supervision- min guard for functional mobility and ADLs. Pt required less cues this session to complete standing grooming, but still requiring MIN cues for sequencing of task. Pt able to attend to obstacles in environment this session with no cues. Pt is able to locate needed ADL items and attends more overall to his environment being able to recognize colors and numbers in hallway.  Will continue to follow acutely per POC. DC plan remains appropriate.    Follow Up Recommendations  SNF;Supervision/Assistance - 24 hour    Equipment Recommendations  Other (comment)(tbd to next venue)    Recommendations for Other Services      Precautions / Restrictions Precautions Precautions: Fall Restrictions Weight Bearing Restrictions: No       Mobility Bed Mobility Overal bed mobility: Needs Assistance Bed Mobility: Supine to Sit     Supine to sit: Supervision     General bed mobility comments: supervision for safety, no physical assist  Transfers Overall transfer level: Needs assistance Equipment used: None Transfers: Sit to/from Stand;Stand Pivot Transfers Sit to Stand: Supervision;Min guard Stand pivot transfers: Supervision       General transfer comment: sup/min guard safety    Balance Overall balance assessment: Needs assistance Sitting-balance support: Feet  supported Sitting balance-Leahy Scale: Normal Sitting balance - Comments: able to sit unsupport with no LOB   Standing balance support: During functional activity Standing balance-Leahy Scale: Fair                             ADL either performed or assessed with clinical judgement   ADL Overall ADL's : Needs assistance/impaired     Grooming: Supervision/safety;Standing;Cueing for sequencing Grooming Details (indicate cue type and reason): able to initiate task with MIN verbal cues this session             Lower Body Dressing: Sitting/lateral leans;Supervision/safety;Set up Lower Body Dressing Details (indicate cue type and reason): 1 verbal cue required to don socks at EOB Toilet Transfer: Supervision/safety;Cueing for safety;Min guard Toilet Transfer Details (indicate cue type and reason): pt able negotiate environment better this session, still requires close min guard- supervision for safety         Functional mobility during ADLs: Min guard;Cueing for safety General ADL Comments: pt requiring less cueing overall to participate in standing ADL and LB dressing, able to navigate environment better this session, pt able to locate items in enviroment such as room numbers and colors in hallway     Vision Patient Visual Report: No change from baseline Vision Assessment?: Vision impaired- to be further tested in functional context   Perception     Praxis      Cognition Arousal/Alertness: Awake/alert Behavior During Therapy: [facility] for tasks assessed/performed;Flat affect Overall Cognitive Status: Impaired/Different from baseline Area of Impairment: Orientation;Awareness;Attention;Problem solving;Memory;Following commands;Safety/judgement                 Orientation Level: Disoriented to;Place;Time;Situation Current Attention Level:  Selective Memory: Decreased recall of precautions;Decreased short-term memory Following Commands: Follows one step commands  inconsistently;Follows one step commands with increased time Safety/Judgement: Decreased awareness of safety;Decreased awareness of deficits Awareness: Intellectual Problem Solving: Slow processing;Requires verbal cues;Difficulty sequencing General Comments: pt distracted but  requires less cues to attend to environment, able to navigate obstables in enviroment better this session, still requiring cues to initiate task, but able to initiate with verbal rather than multimodal,pt often repeats whatever you ask him to do, does better with short, direct language        Exercises     Shoulder Instructions       General Comments      Pertinent Vitals/ Pain       Pain Assessment: No/denies pain  Home Living                                          Prior Functioning/Environment              Frequency  Min 1X/week        Progress Toward Goals  OT Goals(current goals can now be found in the care plan section)  Progress towards OT goals: Progressing toward goals  Acute Rehab OT Goals Patient Stated Goal: not stated OT Goal Formulation: With patient Time For Goal Achievement: 10/11/19 Potential to Achieve Goals: Fair  Plan Discharge plan remains appropriate    Co-evaluation                 AM-PAC OT "6 Clicks" Daily Activity     Outcome Measure   Help from another person eating meals?: None Help from another person taking care of personal grooming?: A Little Help from another person toileting, which includes using toliet, bedpan, or urinal?: A Little Help from another person bathing (including washing, rinsing, drying)?: A Little Help from another person to put on and taking off regular upper body clothing?: A Little Help from another person to put on and taking off regular lower body clothing?: A Little 6 Click Score: 19    End of Session Equipment Utilized During Treatment: Gait belt  OT Visit Diagnosis: Other abnormalities of gait and  mobility (R26.89);Other symptoms and signs involving cognitive function;Other symptoms and signs involving the nervous system (R29.898);Muscle weakness (generalized) (M62.81)   Activity Tolerance Patient tolerated treatment well   Patient Left in chair;with call bell/phone within reach;with chair alarm set   Nurse Communication          Time: 2706-2376 OT Time Calculation (min): 17 min  Charges: OT General Charges $OT Visit: 1 Visit OT Treatments $Self Care/Home Management : 8-22 mins  Audery Amel., COTA/L Acute Rehabilitation Services [phone]   Angelina Pih 10/06/2019, 3:46 PM

## 2019-10-07 DIAGNOSIS — M6282 Rhabdomyolysis: Secondary | ICD-10-CM | POA: Diagnosis not present

## 2019-10-07 LAB — COMPREHENSIVE METABOLIC PANEL
ALT: 49 U/L — ABNORMAL HIGH (ref 0–44)
AST: 38 U/L (ref 15–41)
Albumin: 3.5 g/dL (ref 3.5–5.0)
Alkaline Phosphatase: 61 U/L (ref 38–126)
Anion gap: 9 (ref 5–15)
BUN: 27 mg/dL — ABNORMAL HIGH (ref 6–20)
CO2: 23 mmol/L (ref 22–32)
Calcium: 9.4 mg/dL (ref 8.9–10.3)
Chloride: 106 mmol/L (ref 98–111)
Creatinine, Ser: 1.15 mg/dL (ref 0.61–1.24)
GFR calc Af Amer: >60 mL/min (ref >60)
GFR calc non Af Amer: >60 mL/min (ref >60)
Glucose, Bld: 96 mg/dL (ref 70–99)
Potassium: 3.9 mmol/L (ref 3.5–5.1)
Sodium: 138 mmol/L (ref 135–145)
Total Bilirubin: 0.5 mg/dL (ref 0.3–1.2)
Total Protein: 7.1 g/dL (ref 6.5–8.1)

## 2019-10-07 LAB — CBC WITH DIFFERENTIAL/PLATELET
Abs Immature Granulocytes: 0.04 10*3/uL (ref 0.00–0.07)
Basophils Absolute: 0 10*3/uL (ref 0.0–0.1)
Basophils Relative: 0 %
Eosinophils Absolute: 0.6 10*3/uL — ABNORMAL HIGH (ref 0.0–0.5)
Eosinophils Relative: 7 %
HCT: 49.6 % (ref 39.0–52.0)
Hemoglobin: 16.7 g/dL (ref 13.0–17.0)
Immature Granulocytes: 1 %
Lymphocytes Relative: 26 %
Lymphs Abs: 2.1 10*3/uL (ref 0.7–4.0)
MCH: 31 pg (ref 26.0–34.0)
MCHC: 33.7 g/dL (ref 30.0–36.0)
MCV: 92.2 fL (ref 80.0–100.0)
Monocytes Absolute: 0.8 10*3/uL (ref 0.1–1.0)
Monocytes Relative: 10 %
Neutro Abs: 4.4 10*3/uL (ref 1.7–7.7)
Neutrophils Relative %: 56 %
Platelets: 214 10*3/uL (ref 150–400)
RBC: 5.38 MIL/uL (ref 4.22–5.81)
RDW: 12.5 % (ref 11.5–15.5)
WBC: 6.8 10*3/uL (ref 4.0–10.5)
nRBC: 0 % (ref 0.0–0.2)

## 2019-10-07 LAB — MAGNESIUM: Magnesium: 2 mg/dL (ref 1.7–2.4)

## 2019-10-07 NOTE — Progress Notes (Signed)
Tyler [name] Kitchen  PROGRESS NOTE    Tyler [name]  RKY:[id number] DOB: 1971-08-24 DOA: 07/23/2019 PCP: Patient, No Pcp Per   Brief Narrative:   48 year old with past medical history significant for polysubstance abuse including heroine presented to [facility] ER after being found down with a syringe nearby. He was given Narcan x2 with minimal improvement. On presentation he was found to have severe rhabdomyolysis CPK 72,000, AST 2000 ALT 1000 with acute kidney injury BUN 40 creatinine 2.4 and hyperkalemia potassium 6.2. He was a started on IV fluids, bicarb drip and transferred to [facility] for further care. Initial leukocytosis of 21K, thought to be related to hemoconcentration. UDS negative. Salicylate and acetaminophen negative. CT head and neck were negative for acute traumatic finding. Patient admitted to hospitalist service for rhabdomyolysis, AKI. Rhabdomyolysis has improved, he continued to have persistent encephalopathy and was found to have acute bilateral cerebellar infarct. Neurology was consulted and has subsequently signed off. PT is recommending SNF. Prolonged hospitalization due to placement issues.  10/28: No acute events ON per nursing. 10/29: Denies complaints this morning. Says he's ready to eat breakfast 10/30: Denies complaints. No acute events ON. 10/31: Sleepy this AM. Says he's ok. 11/1: No acute events ON per nursing.   Assessment & Plan:   Principal Problem:   Rhabdomyolysis Active Problems:   Acute kidney failure (HCC)   Acute metabolic encephalopathy   Polysubstance abuse (HCC)   Hyperkalemia   Transaminitis   HTN (hypertension)   Cerebral embolism with cerebral infarction   Protein-calorie malnutrition, severe  Acute bilateral ischemic CVA:  - MRI of the brain show acute CVA.  - MRA negative.  - Ultrasound carotid showed bilateral 1 to 39% ICA stenosis.  - Hemoglobin A1c 5.1.  - Echo showed normal ejection fraction.  - Lower  extremity Dopplers negative for DVT. - Patient was evaluated by neurology who recommend 30-day cardiac monitor events. - statin held d/t elevated LFT     - 11/1: statin resumed  Acute metabolic encephalopathy, POA, multifactorial: - likely secondary to CVA in the setting of alcohol drug abuse.  - UDS was negative Tylenol and salicylate are negative.  - TSH and B12 normal limits.  - The MRI is diffuse slowing but no seizures activity. - per nursing mentation waxes/wanes; will need placement  Severe rhabdomyolysis AKI - Resolved with IV fluids.  - Renal ultrasound negative.  Transaminases with elevated bilirubin:  - Related to alcohol as well as rhabdo. - Right upper quadrant ultrasound showed hepatic steatosis with early cirrhosis, with some gallbladder thickening. - trend LFTs; improving - 10/30: resolved     - 10/31: can resume statin tonight; watch LFTs     - 11/1: stable; resolved  Alcohol abuse:  - thiamine, multivitamin, folic acid.  UTI  - s/p 5 days Rocephin.  Hypertension:  - metoprolol - 10/30: BP accceptable     - 10/31: BP is ok this AM, monitor  Severe protein-cal malnutrition  - nutrition support.  Hypothermia:  - WBC normal no evidence of infection.  - TSH normal.  - Resolved.   Abdominal pain, back pain: - Resolved.  - X-ray showed unchanged T-11 compression fracture, age indeterminate L-1 fracture. - carafate, protonix - 10/29: denies complaints today; ready to eat - 10/30: resolved.  Needs placement d/t unsafe discharge to [name] d/t mentation/capacity.  DVT prophylaxis: lovenox Code Status: FULL   Disposition Plan: TBD  Subjective: Somnolent this morning. No acute events ON per nursing  Objective: Vitals:   10/06/19 2302  10/07/19 0519 10/07/19 0602 10/07/19 0604  BP: (!) 123/92 122/86  105/88  Pulse: 69 68 72 72  Resp:   18    Temp:  97.8 F (36.6 C) 97.7 F (36.5 C)   TempSrc:   Oral   SpO2:  96% 99% 96%  Weight:      Height:       No intake or output data in the 24 hours ending 10/07/19 0741 Filed Weights   10/03/19 0648 10/05/19 0549 10/06/19 0408  Weight: 67.7 kg 82.1 kg 66 kg    Examination:  General: 48 y.o. male resting in bed in NAD Cardiovascular: RRR, +S1, S2, no m/g/r, equal pulses throughout Respiratory: CTABL, no w/r/r, normal WOB GI: BS+, NDNT, no masses noted, no organomegaly noted Neuro: somnolent   Data Reviewed: I have personally reviewed following labs and imaging studies.  CBC: Recent Labs  Lab 10/05/19 0301 10/07/19 0330  WBC 7.7 6.8  NEUTROABS 3.9 4.4  HGB 13.4 16.7  HCT 40.8 49.6  MCV 94.0 92.2  PLT 233 161   Basic Metabolic Panel: Recent Labs  Lab 10/05/19 0301 10/07/19 0330  NA 142 138  K 4.0 3.9  CL 109 106  CO2 23 23  GLUCOSE 85 96  BUN 25* 27*  CREATININE 1.24 1.15  CALCIUM 9.2 9.4  MG 1.9 2.0   GFR: Estimated Creatinine Clearance: 71.7 mL/min (by C-G formula based on SCr of 1.15 mg/dL). Liver Function Tests: Recent Labs  Lab 10/05/19 0301 10/07/19 0330  AST 37 38  ALT 48* 49*  ALKPHOS 61 61  BILITOT 0.7 0.5  PROT 7.0 7.1  ALBUMIN 3.3* 3.5   No results for input(s): LIPASE, AMYLASE in the last 168 hours. No results for input(s): AMMONIA in the last 168 hours. Coagulation Profile: No results for input(s): INR, PROTIME in the last 168 hours. Cardiac Enzymes: No results for input(s): CKTOTAL, CKMB, CKMBINDEX, TROPONINI in the last 168 hours. BNP (last 3 results) No results for input(s): PROBNP in the last 8760 hours. HbA1C: No results for input(s): HGBA1C in the last 72 hours. CBG: No results for input(s): GLUCAP in the last 168 hours. Lipid Profile: No results for input(s): CHOL, HDL, LDLCALC, TRIG, CHOLHDL, LDLDIRECT in the last 72 hours. Thyroid Function Tests: No results for input(s): TSH, T4TOTAL, FREET4, T3FREE, THYROIDAB  in the last 72 hours. Anemia Panel: No results for input(s): VITAMINB12, FOLATE, FERRITIN, TIBC, IRON, RETICCTPCT in the last 72 hours. Sepsis Labs: No results for input(s): PROCALCITON, LATICACIDVEN in the last 168 hours.  No results found for this or any previous visit (from the past 240 hour(s)).    Radiology Studies: No results found.   Scheduled Meds: .  stroke: mapping our early stages of recovery book   Does not apply Once  . aspirin  325 mg Oral Daily  . atorvastatin  20 mg Oral q1800  . enoxaparin (LOVENOX) injection  40 mg Subcutaneous Q24H  . feeding supplement (ENSURE ENLIVE)  237 mL Oral BID BM  . folic acid  1 mg Oral Daily  . lactulose  20 g Oral BID  . metoprolol tartrate  25 mg Oral BID  . multivitamin with minerals  1 tablet Oral Daily  . omega-3 acid ethyl esters  1 g Oral Daily  . pantoprazole  40 mg Oral Daily  . sucralfate  1 g Oral TID WC & HS  . thiamine  100 mg Oral Daily   Or  . thiamine  100 mg Intravenous Daily  Continuous Infusions:   LOS: 76 days    Time spent: 15 minutes spent in the coordination of care today.    Tyler Spikeyrone A Sharrie Self, DO [facility] Pager [phone]  If 7PM-7AM, please contact night-coverage [email or web address] Password [email or web address] 10/07/2019, 7:41 AM

## 2019-10-08 DIAGNOSIS — M6282 Rhabdomyolysis: Secondary | ICD-10-CM | POA: Diagnosis not present

## 2019-10-08 NOTE — Progress Notes (Signed)
Marland Kitchen.  PROGRESS NOTE     Tyler [name]  ZOX:[id number] DOB: 09/13/1971 DOA: 07/23/2019 PCP: Patient, No Pcp Per   Brief Narrative:   48 year old with past medical history significant for polysubstance abuse including heroine [facility] after being found down with a syringe nearby. He was given Narcan x2 with minimal improvement. On presentation he was found to have severe rhabdomyolysis CPK 72,000, AST 2000 ALT 1000 with acute kidney injury BUN 40 creatinine 2.4 and hyperkalemia potassium 6.2. He was a started on IV fluids, bicarb drip and [facility]. Initial leukocytosis of 21K, thought to be related to hemoconcentration. UDS negative. Salicylate and acetaminophen negative. CT head and neck were negative for acute traumatic finding. Patient admitted to hospitalist service for rhabdomyolysis, AKI. Rhabdomyolysis has improved, he continued to have persistent encephalopathy and was found to have acute bilateral cerebellar infarct. Neurology was consulted and has subsequently signed off. PT is recommending SNF. Prolonged hospitalization due to placement issues.  11/2: Says he's not having any problems. He want to be there for his kids.    Assessment & Plan:   Principal Problem:   Rhabdomyolysis Active Problems:   Acute kidney failure (HCC)   Acute metabolic encephalopathy   Polysubstance abuse (HCC)   Hyperkalemia   Transaminitis   HTN (hypertension)   Cerebral embolism with cerebral infarction   Protein-calorie malnutrition, severe  Acute bilateral ischemic CVA:  - MRI of the brain show acute CVA.  - MRA negative.  - Ultrasound carotid showed bilateral 1 to 39% ICA stenosis.  - Hemoglobin A1c 5.1.  - Echo showed normal ejection fraction.  - Lower extremity Dopplers negative for DVT. - Patient was evaluated by neurology who recommend 30-day cardiac monitor events. - statin held d/t elevated LFT     - 11/1: statin resumed  Acute metabolic encephalopathy, POA, multifactorial: - likely secondary to CVA in the setting of alcohol drug abuse.  - UDS was negative Tylenol and salicylate are negative.  - TSH and B12 normal limits.  - The MRI is diffuse slowing but no seizures activity. - per nursing mentation waxes/wanes; will need placement  Severe rhabdomyolysis AKI - Resolved with IV fluids.  - Renal ultrasound negative.  Transaminases with elevated bilirubin:  - Related to alcohol as well as rhabdo. - Right upper quadrant ultrasound showed hepatic steatosis with early cirrhosis, with some gallbladder thickening.     - resolved  Alcohol abuse:  - thiamine, multivitamin, folic acid.  UTI  - s/p 5 days Rocephin.  Hypertension:  - metoprolol; monitor  Severe protein-cal malnutrition  - nutrition support.  Hypothermia:  - WBC normal no evidence of infection.  - TSH normal.  - Resolved.   Abdominal pain, back pain: - Resolved.  - X-ray showed unchanged T-11 compression fracture, age indeterminate L-1 fracture. - carafate, protonix     - resolved  [facility]. Continue as above.   DVT prophylaxis: lovenox Code Status: FULL   Disposition Plan: TBD  ROS:  Denies CP, dyspnea, N, V . Remainder 10-pt ROS is negative for all not previously mentioned.  Subjective: "I'd like to get better for my kids."  Objective: Vitals:   10/07/19 1653 10/07/19 2117 10/08/19 0600 10/08/19 1351  BP: 92/77 109/74  (!) 112/96  Pulse: 81 86  78  Resp: 16 20  18   Temp: 98.3 F (36.8 C) 98.4 F (36.9 C)  98 F (36.7 C)  TempSrc: Oral Oral  SpO2: 97% 97%  100%  Weight:   83 kg   Height:        Intake/Output Summary (Last 24 hours) at 10/08/2019 1437 Last data filed at 10/08/2019 1325 Gross per 24 hour  Intake 490 ml  Output 450 ml  Net 40 ml   Filed Weights    10/05/19 0549 10/06/19 0408 10/08/19 0600  Weight: 82.1 kg 66 kg 83 kg    Examination:  General: 48 y.o. male resting in bed in NAD Cardiovascular: RRR, +S1, S2, no m/g/r, equal pulses throughout Respiratory: CTABL, no w/r/r, normal WOB GI: BS+, NDNT, no masses noted, no organomegaly noted Neuro: alert to name, follows commands   Data Reviewed: I have personally reviewed following labs and imaging studies.  CBC: Recent Labs  Lab 10/05/19 0301 10/07/19 0330  WBC 7.7 6.8  NEUTROABS 3.9 4.4  HGB 13.4 16.7  HCT 40.8 49.6  MCV 94.0 92.2  PLT 233 214   Basic Metabolic Panel: Recent Labs  Lab 10/05/19 0301 10/07/19 0330  NA 142 138  K 4.0 3.9  CL 109 106  CO2 23 23  GLUCOSE 85 96  BUN 25* 27*  CREATININE 1.24 1.15  CALCIUM 9.2 9.4  MG 1.9 2.0   GFR: Estimated Creatinine Clearance: 80.7 mL/min (by C-G formula based on SCr of 1.15 mg/dL). Liver Function Tests: Recent Labs  Lab 10/05/19 0301 10/07/19 0330  AST 37 38  ALT 48* 49*  ALKPHOS 61 61  BILITOT 0.7 0.5  PROT 7.0 7.1  ALBUMIN 3.3* 3.5   No results for input(s): LIPASE, AMYLASE in the last 168 hours. No results for input(s): AMMONIA in the last 168 hours. Coagulation Profile: No results for input(s): INR, PROTIME in the last 168 hours. Cardiac Enzymes: No results for input(s): CKTOTAL, CKMB, CKMBINDEX, TROPONINI in the last 168 hours. BNP (last 3 results) No results for input(s): PROBNP in the last 8760 hours. HbA1C: No results for input(s): HGBA1C in the last 72 hours. CBG: No results for input(s): GLUCAP in the last 168 hours. Lipid Profile: No results for input(s): CHOL, HDL, LDLCALC, TRIG, CHOLHDL, LDLDIRECT in the last 72 hours. Thyroid Function Tests: No results for input(s): TSH, T4TOTAL, FREET4, T3FREE, THYROIDAB in the last 72 hours. Anemia Panel: No results for input(s): VITAMINB12, FOLATE, FERRITIN, TIBC, IRON, RETICCTPCT in the last 72 hours. Sepsis Labs: No results for input(s):  PROCALCITON, LATICACIDVEN in the last 168 hours.  No results found for this or any previous visit (from the past 240 hour(s)).    Radiology Studies: No results found.   Scheduled Meds: .  stroke: mapping our early stages of recovery book   Does not apply Once  . aspirin  325 mg Oral Daily  . atorvastatin  20 mg Oral q1800  . enoxaparin (LOVENOX) injection  40 mg Subcutaneous Q24H  . feeding supplement (ENSURE ENLIVE)  237 mL Oral BID BM  . folic acid  1 mg Oral Daily  . lactulose  20 g Oral BID  . metoprolol tartrate  25 mg Oral BID  . multivitamin with minerals  1 tablet Oral Daily  . omega-3 acid ethyl esters  1 g Oral Daily  . pantoprazole  40 mg Oral Daily  . sucralfate  1 g Oral TID WC & HS  . thiamine  100 mg Oral Daily   Or  . thiamine  100 mg Intravenous Daily   Continuous Infusions:   LOS: 77 days    Time spent: 15 minutes spent in  the coordination of care today.    Jonnie Finner, DO [facility] Pager [phone]  If 7PM-7AM, please contact night-coverage [email or web address] Password [facility] 10/08/2019, 2:37 PM

## 2019-10-09 DIAGNOSIS — M6282 Rhabdomyolysis: Secondary | ICD-10-CM | POA: Diagnosis not present

## 2019-10-09 NOTE — Progress Notes (Signed)
Physical Therapy Treatment Patient Details Name: Tyler [name] MRN: [id number] DOB: February 11, 1971 Today's Date: 10/09/2019    History of Present Illness 48 y/o male transferred from [facility] for unresponsiveness, likely secondary to substance abuse. Pt with rhabdomyolysis and AKI. EEG revealed moderate diffuse encephalopathy. PMH includes polysubstance abuse and HTN. Imaging show acute bialteral cerebellar infarcts.     PT Comments    Pt progressing well from functional mobility however continues to present with impaired cognition and decreased insight to deficits. Pt remains not-oriented to place, time, and situation despite freq re-orientation. Pt with noted R sided neglect and is unable to read room numbers on R side of hallway. Pt unable to navigate back to room with max verbal directional cues. Pt constantly ran into objects on the R side of the hallway. Pt did complete stair navigation but didn't use rail due to being on R side. Pt remains unsafe to return home alone as patient unable to perform IADLs and demos significant decreased insight to safety. Acute PT to cont to work with pt.     Follow Up Recommendations  SNF;Supervision/Assistance - 24 hour     Equipment Recommendations  None recommended by PT    Recommendations for Other Services       Precautions / Restrictions Precautions Precautions: Fall Precaution Comments: pt with R sided neglect Restrictions Weight Bearing Restrictions: No    Mobility  Bed Mobility               General bed mobility comments: pt up in chair upon PT arrival  Transfers Overall transfer level: Needs assistance Equipment used: None Transfers: Sit to/from Stand;Stand Pivot Transfers Sit to Stand: Supervision;Min guard Stand pivot transfers: Supervision       General transfer comment: min guard for safety however pt steady  Ambulation/Gait Ambulation/Gait assistance: Min guard;Supervision Gait Distance (Feet): 300  Feet Assistive device: None Gait Pattern/deviations: Step-through pattern Gait velocity: wfl Gait velocity interpretation: >2.62 ft/sec, indicative of community ambulatory General Gait Details: pt more steady than previous session however does not follow navigation commands especially when directed to the R, pt always turns L. Pt with no episodes of LOB however ran into several things on the R side of the hallway requiring verbal cues to correct   Stairs Stairs: Yes Stairs assistance: Min guard Stair Management: Alternating pattern;Forwards Number of Stairs: 12 General stair comments: pt wouldn't hold onto railing due to it being on the R despite given verbal cues to do so   Wheelchair Mobility    Modified Rankin (Stroke Patients Only) Modified Rankin (Stroke Patients Only) Pre-Morbid Rankin Score: No symptoms Modified Rankin: Moderate disability     Balance Overall balance assessment: Needs assistance Sitting-balance support: Feet supported Sitting balance-Leahy Scale: Normal Sitting balance - Comments: able to sit unsupport with no LOB   Standing balance support: During functional activity Standing balance-Leahy Scale: Good                   Standardized Balance Assessment Standardized Balance Assessment : Dynamic Gait Index(difficult to assess due to poor command follow)   Dynamic Gait Index Level Surface: Mild Impairment Change in Gait Speed: Mild Impairment Gait with Horizontal Head Turns: Mild Impairment Gait with Vertical Head Turns: Mild Impairment Gait and Pivot Turn: Mild Impairment Step Over Obstacle: Mild Impairment Step Around Obstacles: Mild Impairment Steps: Mild Impairment Total Score: 16      Cognition Arousal/Alertness: Awake/alert Behavior During Therapy: Flat affect Overall Cognitive Status: Impaired/Different from baseline Area of  Impairment: Orientation;Attention;Memory;Following commands;Safety/judgement;Problem solving;Awareness                  Orientation Level: Disoriented to;Place;Time;Situation(pt able to recall s/p re-orientation) Current Attention Level: Sustained Memory: Decreased short-term memory(unable to recall room number) Following Commands: Follows one step commands with increased time;Follows one step commands inconsistently Safety/Judgement: Decreased awareness of safety;Decreased awareness of deficits(R sided neglect) Awareness: Intellectual Problem Solving: Decreased initiation;Slow processing;Requires verbal cues;Requires tactile cues General Comments: Pt with R sided neglect. Pt does not turn head to the right. when given navigation instructions to turn R pt always turns L. Pt runs into objects on the R side of hallway. Pt reports 08/29/1971 for todays date, pt re-oriented several time but no carry over, pt did at end of session remember being in hospital for heroin overdose s/p re-orientation.       Exercises      General Comments General comments (skin integrity, edema, etc.): vss      Pertinent Vitals/Pain Pain Assessment: No/denies pain    Home Living                      Prior Function            PT Goals (current goals can now be found in the care plan section) Progress towards PT goals: Progressing toward goals    Frequency    Min 2X/week      PT Plan Current plan remains appropriate    Co-evaluation              AM-PAC PT "6 Clicks" Mobility   Outcome Measure  Help needed turning from your back to your side while in a flat bed without using bedrails?: None Help needed moving from lying on your back to sitting on the side of a flat bed without using bedrails?: None Help needed moving to and from a bed to a chair (including a wheelchair)?: A Little Help needed standing up from a chair using your arms (e.g., wheelchair or bedside chair)?: A Little Help needed to walk in hospital room?: A Little Help needed climbing 3-5 steps with a railing? : A  Little 6 Click Score: 20    End of Session Equipment Utilized During Treatment: Gait belt Activity Tolerance: Patient tolerated treatment well Patient left: in chair;with call bell/phone within reach;with chair alarm set Nurse Communication: Mobility status PT Visit Diagnosis: Unsteadiness on feet (R26.81);Difficulty in walking, not elsewhere classified (R26.2);Other symptoms and signs involving the nervous system (R29.898)     Time: 4196-2229 PT Time Calculation (min) (ACUTE ONLY): 15 min  Charges:  $Gait Training: 8-22 mins                     Tyler [name], PT, DPT Acute Rehabilitation Services Pager #: [phone] Office #: [phone]    Tyler [name] 10/09/2019, 11:50 AM

## 2019-10-09 NOTE — Progress Notes (Signed)
Marland Kitchen  PROGRESS NOTE    Tyler [name]  HYW:[id number] DOB: [birth year] DOA: 07/23/2019 PCP: Patient, No Pcp Per   Brief Narrative:   48 year old with past medical history significant for polysubstance abuse including heroine presented to [facility] ER after being found down with a syringe nearby. He was given Narcan x2 with minimal improvement. On presentation he was found to have severe rhabdomyolysis CPK 72,000, AST 2000 ALT 1000 with acute kidney injury BUN 40 creatinine 2.4 and hyperkalemia potassium 6.2. He was a started on IV fluids, bicarb drip and transferred to [facility] for further care. Initial leukocytosis of 21K, thought to be related to hemoconcentration. UDS negative. Salicylate and acetaminophen negative. CT head and neck were negative for acute traumatic finding. Patient admitted to hospitalist service for rhabdomyolysis, AKI. Rhabdomyolysis has improved, he continued to have persistent encephalopathy and was found to have acute bilateral cerebellar infarct. Neurology was consulted and has subsequently signed off. PT is recommending SNF. Prolonged hospitalization due to placement issues.  11/2: Says he's not having any problems. He want to be there for his kids.  11/3: Denies complaints ON. Says he was expecting to meet the team yesterday.    Assessment & Plan:   Principal Problem:   Rhabdomyolysis Active Problems:   Acute kidney failure (HCC)   Acute metabolic encephalopathy   Polysubstance abuse (HCC)   Hyperkalemia   Transaminitis   HTN (hypertension)   Cerebral embolism with cerebral infarction   Protein-calorie malnutrition, severe  Acute bilateral ischemic CVA:  - MRI of the brain show acute CVA.  - MRA negative.  - Ultrasound carotid showed bilateral 1 to 39% ICA stenosis.  - Hemoglobin A1c 5.1.  - Echo showed normal ejection fraction.  - Lower extremity Dopplers negative for DVT. - Patient was evaluated by neurology  who recommend 30-day cardiac monitor events. - statin held d/t elevated LFT - 11/1: statin resumed  Acute metabolic encephalopathy, POA, multifactorial, now chronic: - likely secondary to CVA in the setting of alcohol drug abuse.  - UDS was negative Tylenol and salicylate are negative.  - TSH and B12 normal limits.  - The MRI is diffuse slowing but no seizures activity. - per nursing mentation waxes/wanes; will need placement, continued assistance with ADL, etc as documents in OT/PT notes  Severe rhabdomyolysis AKI - Resolved with IV fluids.  - Renal ultrasound negative.  Transaminases with elevated bilirubin:  - Related to alcohol as well as rhabdo. - Right upper quadrant ultrasound showed hepatic steatosis with early cirrhosis, with some gallbladder thickening.     - resolved  Alcohol abuse:  - thiamine, multivitamin, folic acid.     - encourage abstinence   UTI  - s/p 5 days Rocephin.  Hypertension:  - metoprolol; monitor     - BP ok  Severe protein-cal malnutrition  - nutrition support.  Hypothermia:  - WBC normal no evidence of infection.  - TSH normal.  - Resolved.   Abdominal pain, back pain: - Resolved.  - X-ray showed unchanged T-11 compression fracture, age indeterminate L-1 fracture. - carafate, protonix     - resolved  Not a safe discharge to self d/t mentation/capacity issues. CM working on placement  DVT prophylaxis: lovenox Code Status: FULL   Disposition Plan: TBD  ROS:  Denies CP, dyspnea, N, V, ab pain . Remainder 10-pt ROS is negative for all not previously mentioned.  Subjective: "They told me that I was gonna meet someone yesterday."  Objective: Vitals:   10/08/19 0600 10/08/19  1351 10/08/19 2025 10/09/19 0526  BP:  (!) 112/96 (!) 126/93 (!) 125/96  Pulse:  78 80 69  Resp:  18 13 20   Temp:  98 F (36.7 C) 98.1 F (36.7 C) 97.9 F  (36.6 C)  TempSrc:   Oral Oral  SpO2:  100% 100% 100%  Weight: 83 kg     Height:        Intake/Output Summary (Last 24 hours) at 10/09/2019 13/02/2019 Last data filed at 10/08/2019 1325 Gross per 24 hour  Intake 490 ml  Output 450 ml  Net 40 ml   Filed Weights   10/05/19 0549 10/06/19 0408 10/08/19 0600  Weight: 82.1 kg 66 kg 83 kg    Examination:  General: 48 y.o. male resting in bed in NAD Cardiovascular: RRR, +S1, S2, no m/g/r Respiratory: CTABL, no w/r/r GI: BS+, NDNT, no masses noted, no organomegaly noted MSK: No e/c/c Neuro: Alert to name, follows commands Psyc: more engaged this AM, calm/cooperative   Data Reviewed: I have personally reviewed following labs and imaging studies.  CBC: Recent Labs  Lab 10/05/19 0301 10/07/19 0330  WBC 7.7 6.8  NEUTROABS 3.9 4.4  HGB 13.4 16.7  HCT 40.8 49.6  MCV 94.0 92.2  PLT 233 214   Basic Metabolic Panel: Recent Labs  Lab 10/05/19 0301 10/07/19 0330  NA 142 138  K 4.0 3.9  CL 109 106  CO2 23 23  GLUCOSE 85 96  BUN 25* 27*  CREATININE 1.24 1.15  CALCIUM 9.2 9.4  MG 1.9 2.0   GFR: Estimated Creatinine Clearance: 80.7 mL/min (by C-G formula based on SCr of 1.15 mg/dL). Liver Function Tests: Recent Labs  Lab 10/05/19 0301 10/07/19 0330  AST 37 38  ALT 48* 49*  ALKPHOS 61 61  BILITOT 0.7 0.5  PROT 7.0 7.1  ALBUMIN 3.3* 3.5   No results for input(s): LIPASE, AMYLASE in the last 168 hours. No results for input(s): AMMONIA in the last 168 hours. Coagulation Profile: No results for input(s): INR, PROTIME in the last 168 hours. Cardiac Enzymes: No results for input(s): CKTOTAL, CKMB, CKMBINDEX, TROPONINI in the last 168 hours. BNP (last 3 results) No results for input(s): PROBNP in the last 8760 hours. HbA1C: No results for input(s): HGBA1C in the last 72 hours. CBG: No results for input(s): GLUCAP in the last 168 hours. Lipid Profile: No results for input(s): CHOL, HDL, LDLCALC, TRIG, CHOLHDL, LDLDIRECT  in the last 72 hours. Thyroid Function Tests: No results for input(s): TSH, T4TOTAL, FREET4, T3FREE, THYROIDAB in the last 72 hours. Anemia Panel: No results for input(s): VITAMINB12, FOLATE, FERRITIN, TIBC, IRON, RETICCTPCT in the last 72 hours. Sepsis Labs: No results for input(s): PROCALCITON, LATICACIDVEN in the last 168 hours.  No results found for this or any previous visit (from the past 240 hour(s)).    Radiology Studies: No results found.   Scheduled Meds: .  stroke: mapping our early stages of recovery book   Does not apply Once  . aspirin  325 mg Oral Daily  . atorvastatin  20 mg Oral q1800  . enoxaparin (LOVENOX) injection  40 mg Subcutaneous Q24H  . feeding supplement (ENSURE ENLIVE)  237 mL Oral BID BM  . folic acid  1 mg Oral Daily  . lactulose  20 g Oral BID  . metoprolol tartrate  25 mg Oral BID  . multivitamin with minerals  1 tablet Oral Daily  . omega-3 acid ethyl esters  1 g Oral Daily  . pantoprazole  40 mg Oral Daily  . sucralfate  1 g Oral TID WC & HS  . thiamine  100 mg Oral Daily   Or  . thiamine  100 mg Intravenous Daily   Continuous Infusions:   LOS: 78 days    Time spent: 15 minutes spent in the coordination of care today.    Teddy Spikeyrone A Yurianna Tusing, DO [facility] Pager [phone]  If 7PM-7AM, please contact night-coverage [email or web address] Password [email or web address] 10/09/2019, 7:27 AM

## 2019-10-10 DIAGNOSIS — M6282 Rhabdomyolysis: Secondary | ICD-10-CM | POA: Diagnosis not present

## 2019-10-10 NOTE — Progress Notes (Signed)
PROGRESS NOTE    Tyler [name]  ZOX:[id number] DOB: 02/23/1971 DOA: 07/23/2019 PCP: Patient, No Pcp Per     Brief Narrative:  Tyler [name] is a 48 year old with past medical history significant for polysubstance abuse including heroine [facility] after being found down with a syringe nearby. He was given Narcan x2 with minimal improvement. On presentation he was found to have severe rhabdomyolysis CPK 72,000, elevated liver enzymes AST 2000 ALT 1000 with acute kidney injury BUN 40 creatinine 2.4 and hyperkalemia K 6.2. He was a started on IV fluids, bicarb drip and [facility]. Initial leukocytosis of 21K, thought to be related to hemoconcentration. UDS negative. Salicylate and acetaminophen negative. CT head and neck were negative for acute traumatic finding. Patient admitted to hospitalist service for rhabdomyolysis, AKI. Rhabdomyolysis has improved, he continued to have persistent encephalopathy and was found to have acute bilateral cerebellar infarct. Neurology was consulted and has subsequently signed off. PT is recommending SNF.Prolonged hospitalization due to placement issues.  New events last 24 hours / Subjective: No new events overnight   Assessment & Plan:   Principal Problem:   Rhabdomyolysis Active Problems:   Acute kidney failure (HCC)   Acute metabolic encephalopathy   Polysubstance abuse (HCC)   Hyperkalemia   Transaminitis   HTN (hypertension)   Cerebral embolism with cerebral infarction   Protein-calorie malnutrition, severe    Acute bilateral ischemic CVA:  - MRI of the brain show acute CVA - MRA negative - Ultrasound carotid showed bilateral 1 to 39% ICA stenosis - Hemoglobin A1c 5.1 - Echo showed normal ejection fraction - Lower extremity Dopplers negative for DVT - Patient was evaluated by neurology who recommend 30-day cardiac monitor event monitor  - Aspirin,  lipitor   Acute metabolic encephalopathy, POA, multifactorial, now chronic: - Likely secondary to CVA in the setting of alcohol drug abuse - UDS was negative. Tylenol and salicylate are negative - TSH and B12 normal limits - EEG showed moderate diffuse encephalopathy which is nonspecific to etiology but no seizures activity - Waxing and waning mentation, stable   Severe rhabdomyolysis AKI: - Resolved with IV fluids - Renal ultrasound negative  Transaminases with elevated bilirubin:  - Related to alcohol as well as rhabdo - Right upper quadrant ultrasound showed hepatic steatosis with early cirrhosis, with some gallbladder thickening - Resolved  Alcohol abuse:  - Thiamine, multivitamin, folic acid  UTI  - Completed 5 days Rocephin  Hypertension - Continue metoprolol   Severe protein-calorie malnutrition  - Nutrition support  Abdominal pain, back pain - X-ray showed unchanged T-11 compression fracture, age indeterminate L-1 fracture - Resolved   DVT prophylaxis: Lovenox Code Status: Full Family Communication: None at bedside Disposition Plan: Pending SNF placement   Consultants:   Neurology    Antimicrobials:  Anti-infectives (From admission, onward)   Start     Dose/Rate Route Frequency Ordered Stop   07/25/19 0745  cefTRIAXone (ROCEPHIN) 1 g in sodium chloride 0.9 % 100 mL IVPB     1 g 200 mL/hr over 30 Minutes Intravenous Every 24 hours 07/25/19 0734 07/29/19 1518        Objective: Vitals:   10/09/19 1449 10/09/19 2029 10/10/19 0959 10/10/19 1254  BP: (!) 128/98 (!) 136/98 (!) 121/92 101/70  Pulse: 77 80 77 77  Resp: 18 15  18   Temp: 97.6 F (36.4 C) 98.3 F (36.8 C)  98.4 F (36.9 C)  TempSrc:  Oral  Oral  SpO2:  99%    Weight:      Height:        Intake/Output Summary (Last 24 hours) at 10/10/2019 1315 Last data filed at 10/10/2019 0840 Gross per 24 hour  Intake -   Output 375 ml  Net -375 ml   Filed Weights   10/05/19 0549 10/06/19 0408 10/08/19 0600  Weight: 82.1 kg 66 kg 83 kg    Examination:  General exam: Appears calm and comfortable  Respiratory system: Clear to auscultation. Respiratory effort normal. No respiratory distress. No conversational dyspnea.  Cardiovascular system: S1 & S2 heard, RRR. No murmurs. No pedal edema. Gastrointestinal system: Abdomen is nondistended, soft and nontender. Normal bowel sounds heard. Central nervous system: Alert and oriented x 1  Extremities: Symmetric in appearance  Skin: No rashes, lesions or ulcers on exposed skin   Data Reviewed: I have personally reviewed following labs and imaging studies  CBC: Recent Labs  Lab 10/05/19 0301 10/07/19 0330  WBC 7.7 6.8  NEUTROABS 3.9 4.4  HGB 13.4 16.7  HCT 40.8 49.6  MCV 94.0 92.2  PLT 233 214   Basic Metabolic Panel: Recent Labs  Lab 10/05/19 0301 10/07/19 0330  NA 142 138  K 4.0 3.9  CL 109 106  CO2 23 23  GLUCOSE 85 96  BUN 25* 27*  CREATININE 1.24 1.15  CALCIUM 9.2 9.4  MG 1.9 2.0   GFR: Estimated Creatinine Clearance: 80.7 mL/min (by C-G formula based on SCr of 1.15 mg/dL). Liver Function Tests: Recent Labs  Lab 10/05/19 0301 10/07/19 0330  AST 37 38  ALT 48* 49*  ALKPHOS 61 61  BILITOT 0.7 0.5  PROT 7.0 7.1  ALBUMIN 3.3* 3.5   No results for input(s): LIPASE, AMYLASE in the last 168 hours. No results for input(s): AMMONIA in the last 168 hours. Coagulation Profile: No results for input(s): INR, PROTIME in the last 168 hours. Cardiac Enzymes: No results for input(s): CKTOTAL, CKMB, CKMBINDEX, TROPONINI in the last 168 hours. BNP (last 3 results) No results for input(s): PROBNP in the last 8760 hours. HbA1C: No results for input(s): HGBA1C in the last 72 hours. CBG: No results for input(s): GLUCAP in the last 168 hours. Lipid Profile: No results for input(s): CHOL, HDL, LDLCALC, TRIG, CHOLHDL, LDLDIRECT in the last 72  hours. Thyroid Function Tests: No results for input(s): TSH, T4TOTAL, FREET4, T3FREE, THYROIDAB in the last 72 hours. Anemia Panel: No results for input(s): VITAMINB12, FOLATE, FERRITIN, TIBC, IRON, RETICCTPCT in the last 72 hours. Sepsis Labs: No results for input(s): PROCALCITON, LATICACIDVEN in the last 168 hours.  No results found for this or any previous visit (from the past 240 hour(s)).    Radiology Studies: No results found.    Scheduled Meds: .  stroke: mapping our early stages of recovery book   Does not apply Once  . aspirin  325 mg Oral Daily  . atorvastatin  20 mg Oral q1800  . enoxaparin (LOVENOX) injection  40 mg Subcutaneous Q24H  . feeding supplement (ENSURE ENLIVE)  237 mL Oral BID BM  . folic acid  1 mg Oral Daily  . lactulose  20 g Oral BID  . metoprolol tartrate  25 mg Oral BID  . multivitamin with minerals  1 tablet Oral Daily  . omega-3 acid ethyl esters  1 g Oral Daily  . pantoprazole  40 mg Oral Daily  . sucralfate  1 g Oral TID WC & HS  . thiamine  100 mg Oral Daily  Or  . thiamine  100 mg Intravenous Daily   Continuous Infusions:   LOS: 79 days      Time spent: 35 minutes   Dessa Phi, DO [facility] 10/10/2019, 1:15 PM   Available via Epic secure chat 7am-7pm After these hours, please refer to coverage provider listed on [email or web address]

## 2019-10-11 DIAGNOSIS — I6340 Cerebral infarction due to embolism of unspecified cerebral artery: Secondary | ICD-10-CM | POA: Diagnosis not present

## 2019-10-11 NOTE — Progress Notes (Signed)
PROGRESS NOTE    Tyler [name]  SHF:[id number] DOB: 28-May-1971 DOA: 07/23/2019 PCP: Patient, No Pcp Per     Brief Narrative:  Tyler [name] is a 48 year old with past medical history significant for polysubstance abuse including heroine presented to [facility] ER after being found down with a syringe nearby. He was given Narcan x2 with minimal improvement. On presentation he was found to have severe rhabdomyolysis CPK 72,000, elevated liver enzymes AST 2000 ALT 1000 with acute kidney injury BUN 40 creatinine 2.4 and hyperkalemia K 6.2. He was a started on IV fluids, bicarb drip and transferred to [facility] for further care. Initial leukocytosis of 21K, thought to be related to hemoconcentration. UDS negative. Salicylate and acetaminophen negative. CT head and neck were negative for acute traumatic finding. Patient admitted to hospitalist service for rhabdomyolysis, AKI. Rhabdomyolysis has improved, he continued to have persistent encephalopathy and was found to have acute bilateral cerebellar infarct. Neurology was consulted and has subsequently signed off. PT is recommending SNF.Prolonged hospitalization due to placement issues.  New events last 24 hours / Subjective: No acute events. No complaints.   Assessment & Plan:   Principal Problem:   Cerebral embolism with cerebral infarction Active Problems:   Rhabdomyolysis   Acute kidney failure (HCC)   Acute metabolic encephalopathy   Polysubstance abuse (HCC)   Hyperkalemia   Transaminitis   HTN (hypertension)   Protein-calorie malnutrition, severe    Acute bilateral ischemic CVA:  - MRI of the brain show acute CVA - MRA negative - Ultrasound carotid showed bilateral 1 to 39% ICA stenosis - Hemoglobin A1c 5.1 - Echo showed normal ejection fraction - Lower extremity Dopplers negative for DVT - Patient was evaluated by neurology who recommend 30-day cardiac monitor event monitor  -  Aspirin, lipitor   Acute metabolic encephalopathy, POA, multifactorial, now chronic: - Likely secondary to CVA in the setting of alcohol drug abuse - UDS was negative. Tylenol and salicylate are negative - TSH and B12 normal limits - EEG showed moderate diffuse encephalopathy which is nonspecific to etiology but no seizures activity - Waxing and waning mentation, stable   Severe rhabdomyolysis AKI: - Resolved with IV fluids - Renal ultrasound negative  Transaminases with elevated bilirubin:  - Related to alcohol as well as rhabdo - Right upper quadrant ultrasound showed hepatic steatosis with early cirrhosis, with some gallbladder thickening - Resolved  Alcohol abuse:  - Thiamine, multivitamin, folic acid  UTI  - Completed 5 days Rocephin  Hypertension - Continue metoprolol   Severe protein-calorie malnutrition  - Nutrition support  Abdominal pain, back pain - X-ray showed unchanged T-11 compression fracture, age indeterminate L-1 fracture - Resolved   DVT prophylaxis: Lovenox Code Status: Full Family Communication: None at bedside Disposition Plan: Pending SNF placement   Consultants:   Neurology    Antimicrobials:  Anti-infectives (From admission, onward)   Start     Dose/Rate Route Frequency Ordered Stop   07/25/19 0745  cefTRIAXone (ROCEPHIN) 1 g in sodium chloride 0.9 % 100 mL IVPB     1 g 200 mL/hr over 30 Minutes Intravenous Every 24 hours 07/25/19 0734 07/29/19 1518       Objective: Vitals:   10/10/19 0959 10/10/19 1254 10/10/19 2113 10/11/19 0518  BP: (!) 121/92 101/70 101/78 107/80  Pulse: 77 77 77 70  Resp:  18 17 18   Temp:  98.4 F (36.9 C) 98.2 F (36.8 C) 97.8 F (36.6 C)  TempSrc:  Oral Oral Oral  SpO2:   96%  97%  Weight:      Height:        Intake/Output Summary (Last 24 hours) at 10/11/2019 1125 Last data filed at 10/11/2019 0900 Gross per 24 hour  Intake  900 ml  Output -  Net 900 ml   Filed Weights   10/05/19 0549 10/06/19 0408 10/08/19 0600  Weight: 82.1 kg 66 kg 83 kg    Examination: General exam: Appears calm and comfortable  Respiratory system: Clear to auscultation. Respiratory effort normal. Cardiovascular system: S1 & S2 heard, RRR. No pedal edema. Gastrointestinal system: Abdomen is nondistended, soft and nontender. Normal bowel sounds heard. Central nervous system: Alert. Speech clear  Extremities: Symmetric in appearance bilaterally  Skin: No rashes, lesions or ulcers on exposed skin  Psychiatry: Pleasantly confused  Data Reviewed: I have personally reviewed following labs and imaging studies  CBC: Recent Labs  Lab 10/05/19 0301 10/07/19 0330  WBC 7.7 6.8  NEUTROABS 3.9 4.4  HGB 13.4 16.7  HCT 40.8 49.6  MCV 94.0 92.2  PLT 233 409   Basic Metabolic Panel: Recent Labs  Lab 10/05/19 0301 10/07/19 0330  NA 142 138  K 4.0 3.9  CL 109 106  CO2 23 23  GLUCOSE 85 96  BUN 25* 27*  CREATININE 1.24 1.15  CALCIUM 9.2 9.4  MG 1.9 2.0   GFR: Estimated Creatinine Clearance: 80.7 mL/min (by C-G formula based on SCr of 1.15 mg/dL). Liver Function Tests: Recent Labs  Lab 10/05/19 0301 10/07/19 0330  AST 37 38  ALT 48* 49*  ALKPHOS 61 61  BILITOT 0.7 0.5  PROT 7.0 7.1  ALBUMIN 3.3* 3.5   No results for input(s): LIPASE, AMYLASE in the last 168 hours. No results for input(s): AMMONIA in the last 168 hours. Coagulation Profile: No results for input(s): INR, PROTIME in the last 168 hours. Cardiac Enzymes: No results for input(s): CKTOTAL, CKMB, CKMBINDEX, TROPONINI in the last 168 hours. BNP (last 3 results) No results for input(s): PROBNP in the last 8760 hours. HbA1C: No results for input(s): HGBA1C in the last 72 hours. CBG: No results for input(s): GLUCAP in the last 168 hours. Lipid Profile: No results for input(s): CHOL, HDL, LDLCALC, TRIG, CHOLHDL, LDLDIRECT in the last 72 hours. Thyroid  Function Tests: No results for input(s): TSH, T4TOTAL, FREET4, T3FREE, THYROIDAB in the last 72 hours. Anemia Panel: No results for input(s): VITAMINB12, FOLATE, FERRITIN, TIBC, IRON, RETICCTPCT in the last 72 hours. Sepsis Labs: No results for input(s): PROCALCITON, LATICACIDVEN in the last 168 hours.  No results found for this or any previous visit (from the past 240 hour(s)).    Radiology Studies: No results found.    Scheduled Meds: .  stroke: mapping our early stages of recovery book   Does not apply Once  . aspirin  325 mg Oral Daily  . atorvastatin  20 mg Oral q1800  . enoxaparin (LOVENOX) injection  40 mg Subcutaneous Q24H  . feeding supplement (ENSURE ENLIVE)  237 mL Oral BID BM  . folic acid  1 mg Oral Daily  . lactulose  20 g Oral BID  . metoprolol tartrate  25 mg Oral BID  . multivitamin with minerals  1 tablet Oral Daily  . omega-3 acid ethyl esters  1 g Oral Daily  . pantoprazole  40 mg Oral Daily  . sucralfate  1 g Oral TID WC & HS  . thiamine  100 mg Oral Daily   Or  . thiamine  100 mg Intravenous Daily  Continuous Infusions:   LOS: 80 days      Time spent: 20 minutes   Noralee StainJennifer Arijana Narayan, DO [facility] 10/11/2019, 11:25 AM   Available via Epic secure chat 7am-7pm After these hours, please refer to coverage provider listed on [email or web address]

## 2019-10-11 NOTE — Plan of Care (Signed)
  Problem: Clinical Measurements: Goal: Ability to maintain clinical measurements within normal limits will improve Outcome: Progressing   Problem: Health Behavior/Discharge Planning: Goal: Ability to manage health-related needs will improve Outcome: Progressing   Problem: Nutrition: Goal: Risk of aspiration will decrease Outcome: Progressing Goal: Dietary intake will improve Outcome: Progressing   Problem: Ischemic Stroke/TIA Tissue Perfusion: Goal: Complications of ischemic stroke/TIA will be minimized Outcome: Progressing   Problem: Clinical Measurements: Goal: Ability to maintain clinical measurements within normal limits will improve Outcome: Progressing

## 2019-10-11 NOTE — Progress Notes (Signed)
Occupational Therapy Treatment Patient Details Name: Tyler [name] MRN: [id number] DOB: 1971/08/12 Today's Date: 10/11/2019    History of present illness 48 y/o male transferred from [place] for unresponsiveness, likely secondary to substance abuse. Pt with rhabdomyolysis and AKI. EEG revealed moderate diffuse encephalopathy. PMH includes polysubstance abuse and HTN. Imaging show acute bialteral cerebellar infarcts.    OT comments  Patient seated at edge of bed upon arrival and agreeable to OT. Patient able to navigate his room environment in order to use toilet at supervision level. Verbal cue to initiate sink side grooming. Patient appear to have left neglect, picking up grooming items from the sink and attempting to carry them into the bathroom, as if patient does not see the sink. With redirection, patient continues to stand on the right side of the sink (instead of facing sink/mirror) having to extend his reach to pick up wash cloth and turn on/off faucets. Seated edge of bed OT instruct patient in peripheral vision assessment. Limited carry over of directions first attempt with patient turning his head to the R to see fingers. Second attempt pt keep head at midline and still points to R side only, does not see therapist's finger on L.   Follow Up Recommendations  SNF;Supervision/Assistance - 24 hour          Precautions / Restrictions Precautions Precautions: Fall Precaution Comments: (poor safety awareness, poss bilateral neglect) Restrictions Weight Bearing Restrictions: No       Mobility   General bed mobility comments: Pt sitting on EOB upon arrival  Transfers Overall transfer level: Needs assistance Equipment used: None Transfers: Sit to/from Stand Sit to Stand: Supervision Stand pivot transfers: Supervision            Balance Overall balance assessment: Needs assistance Sitting-balance support: Feet supported Sitting balance-Leahy Scale: Normal Sitting  balance - Comments: able to sit unsupport with no LOB   Standing balance support: During functional activity;No upper extremity supported Standing balance-Leahy Scale: Good                             ADL either performed or assessed with clinical judgement   ADL   Eating/Feeding: Independent;Sitting   Grooming: Supervision/safety;Standing;Cueing for sequencing Grooming Details (indicate cue type and reason): standing sinkside patient requires verbal cues for sequencing between tasks brushing teeth and washing his face         Upper Body Dressing : Standing;Supervision/safety   Lower Body Dressing: Sit to/from stand;Supervision/safety   Toilet Transfer: Supervision/safety;Ambulation;Regular Glass blower/designer Details (indicate cue type and reason): pt able to negotiate his environment, open door and transfer onto toilet without physical assistance  Toileting- Clothing Manipulation and Hygiene: Supervision/safety;Sitting/lateral lean       Functional mobility during ADLs: Supervision/safety;Cueing for safety General ADL Comments: Patient demonstrating left neglect, standing on right side of sink and reaching from right side of sink to turn on faucet and pick up grooming items     Vision   Vision Assessment?: Yes Visual Fields: Left visual field deficit;Other (comment)(peripheral vision seated EOB x2 trials, deficit on L) Additional Comments: dysmetria test negative, able to locate finger in 4 quadrants tested          Cognition Arousal/Alertness: Awake/alert Behavior During Therapy: WFL for tasks assessed/performed Overall Cognitive Status: Impaired/Different from baseline Area of Impairment: Attention;Safety/judgement;Awareness                 Orientation Level: Disoriented to;Place;Time;Situation Current Attention  Level: Sustained Memory: Decreased short-term memory Following Commands: Follows one step commands inconsistently Safety/Judgement:  Decreased awareness of deficits;Decreased awareness of safety Awareness: Intellectual Problem Solving: Requires verbal cues;Requires tactile cues;Slow processing General Comments: pt exhibiting some expressive aphasia/word salad        Exercises Exercises: Other exercises Other Exercises Other Exercises: Stepping in multi directional planes (i.e. forward, backwards, side stepping) and also over obstacles Other Exercises: Dual tasking with gait including way finding, particularly locating rooms on right           Pertinent Vitals/ Pain       Pain Assessment: No/denies pain Faces Pain Scale: No hurt         Frequency  Min 1X/week        Progress Toward Goals  OT Goals(current goals can now be found in the care plan section)  Progress towards OT goals: Progressing toward goals  Acute Rehab OT Goals Patient Stated Goal: not stated OT Goal Formulation: With patient Time For Goal Achievement: 10/25/19 Potential to Achieve Goals: Good ADL Goals Pt Will Perform Grooming: Independently;standing Pt Will Perform Upper Body Bathing: Independently;sitting Pt Will Perform Lower Body Bathing: Independently;sit to/from stand Pt Will Perform Upper Body Dressing: Independently;sitting Pt Will Perform Lower Body Dressing: Independently;sit to/from stand Pt Will Transfer to Toilet: Independently;ambulating;regular height toilet;grab bars Pt Will Perform Toileting - Clothing Manipulation and hygiene: Independently;sit to/from stand Pt Will Perform Tub/Shower Transfer: Independently;shower seat;ambulating;grab bars Additional ADL Goal #1: pt will identify 3/3 ADL/toiletry items and retrieve them in prep for selfcare  Plan Discharge plan remains appropriate       AM-PAC OT "6 Clicks" Daily Activity     Outcome Measure   Help from another person eating meals?: None Help from another person taking care of personal grooming?: A Little Help from another person toileting, which includes  using toliet, bedpan, or urinal?: A Little Help from another person bathing (including washing, rinsing, drying)?: A Little Help from another person to put on and taking off regular upper body clothing?: A Little Help from another person to put on and taking off regular lower body clothing?: A Little 6 Click Score: 19    End of Session    OT Visit Diagnosis: Other symptoms and signs involving cognitive function;Unsteadiness on feet (R26.81);Other abnormalities of gait and mobility (R26.89)   Activity Tolerance Patient tolerated treatment well   Patient Left in bed;with bed alarm set;with call bell/phone within reach           Time: 1016-1031 OT Time Calculation (min): 15 min  Charges: OT General Charges $OT Visit: 1 Visit OT Treatments $Self Care/Home Management : 8-22 mins  Myrtie Neither OT OT office: [phone]   Carmelia Roller 10/11/2019, 12:05 PM

## 2019-10-11 NOTE — Progress Notes (Signed)
Physical Therapy Treatment Patient Details Name: Tyler [name] MRN: [id number] DOB: February 26, 1971 Today's Date: 10/11/2019    History of Present Illness 48 y/o male transferred from [place] for unresponsiveness, likely secondary to substance abuse. Pt with rhabdomyolysis and AKI. EEG revealed moderate diffuse encephalopathy. PMH includes polysubstance abuse and HTN. Imaging show acute bialteral cerebellar infarcts.     PT Comments    Session focused on high level balance activities, progression of activity tolerance, and cognitive remediation with dual tasking during ambulation. Pt with continued right inattention, running into objects in hallway. Pt displays significant cognitive impairments including decreased orientation, attention, awareness, and memory. Continue to recommend SNF for ongoing Physical Therapy.        Follow Up Recommendations  SNF;Supervision/Assistance - 24 hour     Equipment Recommendations  None recommended by PT    Recommendations for Other Services       Precautions / Restrictions Precautions Precautions: Fall Precaution Comments: pt with R sided neglect Restrictions Weight Bearing Restrictions: No    Mobility  Bed Mobility               General bed mobility comments: Pt sitting on EOB upon arrival  Transfers Overall transfer level: Needs assistance Equipment used: None Transfers: Sit to/from Stand Sit to Stand: Supervision Stand pivot transfers: Supervision          Ambulation/Gait Ambulation/Gait assistance: Supervision Gait Distance (Feet): 500 Feet Assistive device: None Gait Pattern/deviations: Step-through pattern     General Gait Details: Pt with 1 episode of running into objects on right side, decreased attention and ability to dual task during gait   Stairs             Wheelchair Mobility    Modified Rankin (Stroke Patients Only) Modified Rankin (Stroke Patients Only) Pre-Morbid Rankin Score: No  symptoms Modified Rankin: Moderate disability     Balance Overall balance assessment: Needs assistance Sitting-balance support: Feet supported Sitting balance-Leahy Scale: Normal Sitting balance - Comments: able to sit unsupport with no LOB   Standing balance support: During functional activity Standing balance-Leahy Scale: Good                              Cognition Arousal/Alertness: Awake/alert Behavior During Therapy: Flat affect Overall Cognitive Status: Impaired/Different from baseline Area of Impairment: Orientation;Attention;Memory;Following commands;Safety/judgement;Problem solving;Awareness                 Orientation Level: Disoriented to;Place;Time;Situation Current Attention Level: Sustained Memory: Decreased short-term memory Following Commands: Follows one step commands inconsistently Safety/Judgement: Decreased awareness of safety;Decreased awareness of deficits(R sided neglect) Awareness: Intellectual Problem Solving: Decreased initiation;Slow processing;Requires verbal cues;Requires tactile cues General Comments: Pt stating "2," when asked what month it is, and then stating "I don't know, 2001?" when asked what year it is. Stating Thanksgiving was in January. When re-oriented pt able to recall "Novemeber," after ~5 minutes but unable to recall after ~15 minutes. Poor short term memory and awareness. Unable to find room with max cues.      Exercises Other Exercises Other Exercises: Stepping in multi directional planes (i.e. forward, backwards, side stepping) and also over obstacles Other Exercises: Dual tasking with gait including way finding, particularly locating rooms on right    General Comments        Pertinent Vitals/Pain Pain Assessment: Faces Faces Pain Scale: No hurt    Home Living  Prior Function            PT Goals (current goals can now be found in the care plan section) Acute Rehab PT  Goals Patient Stated Goal: not stated Potential to Achieve Goals: Fair Progress towards PT goals: Progressing toward goals    Frequency    Min 2X/week      PT Plan Current plan remains appropriate    Co-evaluation              AM-PAC PT "6 Clicks" Mobility   Outcome Measure  Help needed turning from your back to your side while in a flat bed without using bedrails?: None Help needed moving from lying on your back to sitting on the side of a flat bed without using bedrails?: None Help needed moving to and from a bed to a chair (including a wheelchair)?: None Help needed standing up from a chair using your arms (e.g., wheelchair or bedside chair)?: None Help needed to walk in hospital room?: None Help needed climbing 3-5 steps with a railing? : A Little 6 Click Score: 23    End of Session Equipment Utilized During Treatment: Gait belt Activity Tolerance: Patient tolerated treatment well Patient left: with call bell/phone within reach;in bed Nurse Communication: Mobility status PT Visit Diagnosis: Unsteadiness on feet (R26.81);Difficulty in walking, not elsewhere classified (R26.2);Other symptoms and signs involving the nervous system (W97.989)     Time: 2119-4174 PT Time Calculation (min) (ACUTE ONLY): 15 min  Charges:  $Therapeutic Activity: 8-22 mins                     Laurina Bustle, PT, DPT Acute Rehabilitation Services Pager [phone] Office [phone]    Vanetta Mulders 10/11/2019, 10:05 AM

## 2019-10-11 NOTE — Progress Notes (Signed)
Nutrition Follow-up  RD working remotely.   DOCUMENTATION CODES:   Severe malnutrition in context of acute illness/injury  INTERVENTION:  - continue Ensure Enlive BID. - continue to encourage PO intakes.    NUTRITION DIAGNOSIS:   Severe Malnutrition related to acute illness(acute toxic/metabolic encephalopathy) as evidenced by moderate fat depletion, severe fat depletion, moderate muscle depletion, severe muscle depletion, energy intake < or equal to 50% for > or equal to 5 days, percent weight loss. -ongoing  GOAL:   Patient will meet greater than or equal to 90% of their needs -met  MONITOR:   PO intake, Supplement acceptance, Labs, Weight trends, Skin, I & O's  ASSESSMENT:   Tyler [name] is a 48 y.o. male with medical history significant of polysubstance abuse including heroin.  Brought in to [facility] ED just before MN after being found down by friend last evening with nearby syringe.  Question accuracy of weight on 11/2 given weight recordings prior to that. Patient has been eating 100% of meals since RD follow-up on 10/30. He has been accepting all bottles of Ensure since that time as well.   Per notes, patient is pending SNF placement.    Labs reviewed; BUN: 27 mg/dl. Medications reviewed; 1 mg folvite/day, daily multivitamin with minerals, 1 g carafate TID, 100 mg oral thiamine/day.     Diet Order:   Diet Order            DIET DYS 3 Room service appropriate? No; Fluid consistency: Thin  Diet effective now              EDUCATION NEEDS:   Not appropriate for education at this time  Skin:  Skin Assessment: Reviewed RN Assessment  Last BM:  11/3  Height:   Ht Readings from Last 1 Encounters:  08/09/19 '5\' 8"'  (1.727 m)    Weight:   Wt Readings from Last 1 Encounters:  10/08/19 83 kg    Ideal Body Weight:  70 kg  BMI:  Body mass index is 27.83 kg/m.  Estimated Nutritional Needs:   Kcal:  2000-2200  Protein:  105-120 grams  Fluid:  > 2  L      Jarome Matin, MS, RD, LDN, [facility] Inpatient Clinical Dietitian Pager # [phone] After hours/weekend pager # [phone]

## 2019-10-11 NOTE — Plan of Care (Signed)
  Problem: Clinical Measurements: Goal: Ability to maintain clinical measurements within normal limits will improve Outcome: Progressing   Problem: Health Behavior/Discharge Planning: Goal: Ability to manage health-related needs will improve Outcome: Progressing   Problem: Nutrition: Goal: Risk of aspiration will decrease Outcome: Progressing Goal: Dietary intake will improve Outcome: Progressing   Problem: Ischemic Stroke/TIA Tissue Perfusion: Goal: Complications of ischemic stroke/TIA will be minimized Outcome: Progressing   Problem: Clinical Measurements: Goal: Ability to maintain clinical measurements within normal limits will improve Outcome: Progressing   

## 2019-10-12 DIAGNOSIS — I6340 Cerebral infarction due to embolism of unspecified cerebral artery: Secondary | ICD-10-CM | POA: Diagnosis not present

## 2019-10-12 NOTE — Progress Notes (Signed)
   10/12/19 2117  MEWS Score  Resp 18  Pulse Rate 70  BP 118/89  Temp 98.3 F (36.8 C)  SpO2 98 %  O2 Device Room Air  MEWS Score  MEWS RR 0  MEWS Pulse 0  MEWS Systolic 0  MEWS LOC 0  MEWS Temp 0  MEWS Score 0  MEWS Score Color Green  MEWS Assessment  Is this an acute change? No  MEWS Guidelines - (patients age 50 and over)  Red - At High Risk for Deterioration Yellow - At risk for Deterioration  1. Go to room and assess patient 2. Validate data. Is this patient's baseline? If data confirmed: 3. Is this an acute change? 4. Administer prn meds/treatments as ordered. 5. Note Sepsis score 6. Review goals of care 7. Sports coach, RRT nurse and Provider. 8. Ask Provider to come to bedside.  9. Document patient condition/interventions/response. 10. Increase frequency of vital signs and focused assessments to at least q15 minutes x 4, then q30 minutes x2. - If stable, then q1h x3, then q4h x3 and then q8h or dept. routine. - If unstable, contact Provider & RRT nurse. Prepare for possible transfer. 11. Add entry in progress notes using the smart phrase ".MEWS". 1. Go to room and assess patient 2. Validate data. Is this patient's baseline? If data confirmed: 3. Is this an acute change? 4. Administer prn meds/treatments as ordered? 5. Note Sepsis score 6. Review goals of care 7. Sports coach and Provider 8. Call RRT nurse as needed. 9. Document patient condition/interventions/response. 10. Increase frequency of vital signs and focused assessments to at least q2h x2. - If stable, then q4h x2 and then q8h or dept. routine. - If unstable, contact Provider & RRT nurse. Prepare for possible transfer. 11. Add entry in progress notes using the smart phrase ".MEWS".  Green - Likely stable Lavender - Comfort Care Only  1. Continue routine/ordered monitoring.  2. Review goals of care. 1. Continue routine/ordered monitoring. 2. Review goals of care.

## 2019-10-12 NOTE — Plan of Care (Signed)
  Problem: Clinical Measurements: Goal: Ability to maintain clinical measurements within normal limits will improve Outcome: Progressing   Problem: Clinical Measurements: Goal: Ability to maintain clinical measurements within normal limits will improve Outcome: Progressing   Problem: Health Behavior/Discharge Planning: Goal: Ability to manage health-related needs will improve Outcome: Progressing   Problem: Nutrition: Goal: Risk of aspiration will decrease Outcome: Progressing Goal: Dietary intake will improve Outcome: Progressing   Problem: Ischemic Stroke/TIA Tissue Perfusion: Goal: Complications of ischemic stroke/TIA will be minimized Outcome: Progressing   

## 2019-10-12 NOTE — Progress Notes (Signed)
PROGRESS NOTE    Tyler [name]  INO:[id number] DOB: 1971/02/07 DOA: 07/23/2019 PCP: Patient, No Pcp Per     Brief Narrative:  Tyler [name] is a 48 year old with past medical history significant for polysubstance abuse including heroine presented to [facility] ER after being found down with a syringe nearby. He was given Narcan x2 with minimal improvement. On presentation he was found to have severe rhabdomyolysis CPK 72,000, elevated liver enzymes AST 2000 ALT 1000 with acute kidney injury BUN 40 creatinine 2.4 and hyperkalemia K 6.2. He was a started on IV fluids, bicarb drip and transferred to [facility] for further care. Initial leukocytosis of 21K, thought to be related to hemoconcentration. UDS negative. Salicylate and acetaminophen negative. CT head and neck were negative for acute traumatic finding. Patient admitted to hospitalist service for rhabdomyolysis, AKI. Rhabdomyolysis has improved, he continued to have persistent encephalopathy and was found to have acute bilateral cerebellar infarct. Neurology was consulted and has subsequently signed off. PT is recommending SNF.Prolonged hospitalization due to placement issues.  New events last 24 hours / Subjective: No acute events overnight.  Patient sitting at the side of bed, eating breakfast without any complaints today  Assessment & Plan:   Principal Problem:   Cerebral embolism with cerebral infarction Active Problems:   Rhabdomyolysis   Acute kidney failure (HCC)   Acute metabolic encephalopathy   Polysubstance abuse (HCC)   Hyperkalemia   Transaminitis   HTN (hypertension)   Protein-calorie malnutrition, severe    Acute bilateral ischemic CVA:  - MRI of the brain show acute CVA - MRA negative - Ultrasound carotid showed bilateral 1 to 39% ICA stenosis - Hemoglobin A1c 5.1 - Echo showed normal ejection fraction - Lower extremity Dopplers negative for DVT - Patient was evaluated  by neurology who recommend 30-day cardiac monitor event monitor  - Aspirin, lipitor   Acute metabolic encephalopathy, POA, multifactorial, now chronic: - Likely secondary to CVA in the setting of alcohol drug abuse - UDS was negative. Tylenol and salicylate are negative - TSH and B12 normal limits - EEG showed moderate diffuse encephalopathy which is nonspecific to etiology but no seizures activity - Waxing and waning mentation, stable   Severe rhabdomyolysis AKI: - Resolved with IV fluids - Renal ultrasound negative  Transaminases with elevated bilirubin:  - Related to alcohol as well as rhabdo - Right upper quadrant ultrasound showed hepatic steatosis with early cirrhosis, with some gallbladder thickening - Resolved  Alcohol abuse:  - Thiamine, multivitamin, folic acid  UTI  - Completed 5 days Rocephin  Hypertension - Continue metoprolol   Severe protein-calorie malnutrition  - Nutrition support  Abdominal pain, back pain - X-ray showed unchanged T-11 compression fracture, age indeterminate L-1 fracture - Resolved   DVT prophylaxis: Lovenox Code Status: Full Family Communication: None at bedside Disposition Plan: Pending SNF placement   Consultants:   Neurology    Antimicrobials:  Anti-infectives (From admission, onward)   Start     Dose/Rate Route Frequency Ordered Stop   07/25/19 0745  cefTRIAXone (ROCEPHIN) 1 g in sodium chloride 0.9 % 100 mL IVPB     1 g 200 mL/hr over 30 Minutes Intravenous Every 24 hours 07/25/19 0734 07/29/19 1518       Objective: Vitals:   10/11/19 1504 10/11/19 2132 10/12/19 0525 10/12/19 0942  BP: 112/85 109/78 115/72 (!) 133/101  Pulse: 70 79 73 81  Resp: 17 16 16    Temp: 98.8 F (37.1 C) 98.3 F (36.8 C) 97.7 F (36.5 C)  97.7 F (36.5 C)  TempSrc: Oral Oral Oral Oral  SpO2: 98% 94% 98% 100%  Weight:      Height:        Intake/Output  Summary (Last 24 hours) at 10/12/2019 1026 Last data filed at 10/12/2019 1093 Gross per 24 hour  Intake 600 ml  Output 600 ml  Net 0 ml   Filed Weights   10/05/19 0549 10/06/19 0408 10/08/19 0600  Weight: 82.1 kg 66 kg 83 kg    Examination: General exam: Appears calm and comfortable     Data Reviewed: I have personally reviewed following labs and imaging studies  CBC: Recent Labs  Lab 10/07/19 0330  WBC 6.8  NEUTROABS 4.4  HGB 16.7  HCT 49.6  MCV 92.2  PLT 235   Basic Metabolic Panel: Recent Labs  Lab 10/07/19 0330  NA 138  K 3.9  CL 106  CO2 23  GLUCOSE 96  BUN 27*  CREATININE 1.15  CALCIUM 9.4  MG 2.0   GFR: Estimated Creatinine Clearance: 80.7 mL/min (by C-G formula based on SCr of 1.15 mg/dL). Liver Function Tests: Recent Labs  Lab 10/07/19 0330  AST 38  ALT 49*  ALKPHOS 61  BILITOT 0.5  PROT 7.1  ALBUMIN 3.5   No results for input(s): LIPASE, AMYLASE in the last 168 hours. No results for input(s): AMMONIA in the last 168 hours. Coagulation Profile: No results for input(s): INR, PROTIME in the last 168 hours. Cardiac Enzymes: No results for input(s): CKTOTAL, CKMB, CKMBINDEX, TROPONINI in the last 168 hours. BNP (last 3 results) No results for input(s): PROBNP in the last 8760 hours. HbA1C: No results for input(s): HGBA1C in the last 72 hours. CBG: No results for input(s): GLUCAP in the last 168 hours. Lipid Profile: No results for input(s): CHOL, HDL, LDLCALC, TRIG, CHOLHDL, LDLDIRECT in the last 72 hours. Thyroid Function Tests: No results for input(s): TSH, T4TOTAL, FREET4, T3FREE, THYROIDAB in the last 72 hours. Anemia Panel: No results for input(s): VITAMINB12, FOLATE, FERRITIN, TIBC, IRON, RETICCTPCT in the last 72 hours. Sepsis Labs: No results for input(s): PROCALCITON, LATICACIDVEN in the last 168 hours.  No results found for this or any previous visit (from the past 240 hour(s)).    Radiology Studies: No results found.     Scheduled Meds: .  stroke: mapping our early stages of recovery book   Does not apply Once  . aspirin  325 mg Oral Daily  . atorvastatin  20 mg Oral q1800  . enoxaparin (LOVENOX) injection  40 mg Subcutaneous Q24H  . feeding supplement (ENSURE ENLIVE)  237 mL Oral BID BM  . folic acid  1 mg Oral Daily  . lactulose  20 g Oral BID  . metoprolol tartrate  25 mg Oral BID  . multivitamin with minerals  1 tablet Oral Daily  . omega-3 acid ethyl esters  1 g Oral Daily  . pantoprazole  40 mg Oral Daily  . sucralfate  1 g Oral TID WC & HS  . thiamine  100 mg Oral Daily   Or  . thiamine  100 mg Intravenous Daily   Continuous Infusions:   LOS: 81 days      Time spent: 10 minutes   Dessa Phi, DO [facility] 10/12/2019, 10:26 AM   Available via Epic secure chat 7am-7pm After these hours, please refer to coverage provider listed on [email or web address]

## 2019-10-13 DIAGNOSIS — I6340 Cerebral infarction due to embolism of unspecified cerebral artery: Secondary | ICD-10-CM | POA: Diagnosis not present

## 2019-10-13 NOTE — Progress Notes (Signed)
PROGRESS NOTE    Tyler [name]  YNW:[id number] DOB: [birth year] DOA: 07/23/2019 PCP: Patient, No Pcp Per     Brief Narrative:  Tyler [name] is a 48 year old with past medical history significant for polysubstance abuse including heroine presented to [facility] ER after being found down with a syringe nearby. He was given Narcan x2 with minimal improvement. On presentation he was found to have severe rhabdomyolysis CPK 72,000, elevated liver enzymes AST 2000 ALT 1000 with acute kidney injury BUN 40 creatinine 2.4 and hyperkalemia K 6.2. He was a started on IV fluids, bicarb drip and transferred to [facility] for further care. Initial leukocytosis of 21K, thought to be related to hemoconcentration. UDS negative. Salicylate and acetaminophen negative. CT head and neck were negative for acute traumatic finding. Patient admitted to hospitalist service for rhabdomyolysis, AKI. Rhabdomyolysis has improved, he continued to have persistent encephalopathy and was found to have acute bilateral cerebellar infarct. Neurology was consulted and has subsequently signed off. PT is recommending SNF.Prolonged hospitalization due to placement issues.  New events last 24 hours / Subjective: No new issues overnight  Assessment & Plan:   Principal Problem:   Cerebral embolism with cerebral infarction Active Problems:   Rhabdomyolysis   Acute kidney failure (HCC)   Acute metabolic encephalopathy   Polysubstance abuse (HCC)   Hyperkalemia   Transaminitis   HTN (hypertension)   Protein-calorie malnutrition, severe    Acute bilateral ischemic CVA:  - MRI of the brain show acute CVA - MRA negative - Ultrasound carotid showed bilateral 1 to 39% ICA stenosis - Hemoglobin A1c 5.1 - Echo showed normal ejection fraction - Lower extremity Dopplers negative for DVT - Patient was evaluated by neurology who recommend 30-day cardiac monitor event monitor  - Aspirin,  lipitor   Acute metabolic encephalopathy, POA, multifactorial, now chronic: - Likely secondary to CVA in the setting of alcohol drug abuse - UDS was negative. Tylenol and salicylate are negative - TSH and B12 normal limits - EEG showed moderate diffuse encephalopathy which is nonspecific to etiology but no seizures activity - Waxing and waning mentation, stable   Severe rhabdomyolysis AKI: - Resolved with IV fluids - Renal ultrasound negative  Transaminases with elevated bilirubin:  - Related to alcohol as well as rhabdo - Right upper quadrant ultrasound showed hepatic steatosis with early cirrhosis, with some gallbladder thickening - Resolved  Alcohol abuse:  - Thiamine, multivitamin, folic acid  UTI  - Completed 5 days Rocephin  Hypertension - Continue metoprolol   Severe protein-calorie malnutrition  - Nutrition support  Abdominal pain, back pain - X-ray showed unchanged T-11 compression fracture, age indeterminate L-1 fracture - Resolved   DVT prophylaxis: Lovenox Code Status: Full Family Communication: None at bedside Disposition Plan: Pending SNF placement   Consultants:   Neurology    Antimicrobials:  Anti-infectives (From admission, onward)   Start     Dose/Rate Route Frequency Ordered Stop   07/25/19 0745  cefTRIAXone (ROCEPHIN) 1 g in sodium chloride 0.9 % 100 mL IVPB     1 g 200 mL/hr over 30 Minutes Intravenous Every 24 hours 07/25/19 0734 07/29/19 1518       Objective: Vitals:   10/12/19 1716 10/12/19 2117 10/13/19 0454 10/13/19 1013  BP: 117/84 118/89 (!) 135/99 105/74  Pulse: 71 70 71 67  Resp: 16 18 18    Temp: 98.1 F (36.7 C) 98.3 F (36.8 C) 98 F (36.7 C)   TempSrc: Oral Oral    SpO2: 97% 98% 93%  Weight:      Height:       No intake or output data in the 24 hours ending 10/13/19 1203 Filed Weights   10/05/19 0549 10/06/19 0408 10/08/19 0600  Weight:  82.1 kg 66 kg 83 kg    Examination: General exam: Appears calm and comfortable    Data Reviewed: I have personally reviewed following labs and imaging studies  CBC: Recent Labs  Lab 10/07/19 0330  WBC 6.8  NEUTROABS 4.4  HGB 16.7  HCT 49.6  MCV 92.2  PLT 709   Basic Metabolic Panel: Recent Labs  Lab 10/07/19 0330  NA 138  K 3.9  CL 106  CO2 23  GLUCOSE 96  BUN 27*  CREATININE 1.15  CALCIUM 9.4  MG 2.0   GFR: Estimated Creatinine Clearance: 80.7 mL/min (by C-G formula based on SCr of 1.15 mg/dL). Liver Function Tests: Recent Labs  Lab 10/07/19 0330  AST 38  ALT 49*  ALKPHOS 61  BILITOT 0.5  PROT 7.1  ALBUMIN 3.5   No results for input(s): LIPASE, AMYLASE in the last 168 hours. No results for input(s): AMMONIA in the last 168 hours. Coagulation Profile: No results for input(s): INR, PROTIME in the last 168 hours. Cardiac Enzymes: No results for input(s): CKTOTAL, CKMB, CKMBINDEX, TROPONINI in the last 168 hours. BNP (last 3 results) No results for input(s): PROBNP in the last 8760 hours. HbA1C: No results for input(s): HGBA1C in the last 72 hours. CBG: No results for input(s): GLUCAP in the last 168 hours. Lipid Profile: No results for input(s): CHOL, HDL, LDLCALC, TRIG, CHOLHDL, LDLDIRECT in the last 72 hours. Thyroid Function Tests: No results for input(s): TSH, T4TOTAL, FREET4, T3FREE, THYROIDAB in the last 72 hours. Anemia Panel: No results for input(s): VITAMINB12, FOLATE, FERRITIN, TIBC, IRON, RETICCTPCT in the last 72 hours. Sepsis Labs: No results for input(s): PROCALCITON, LATICACIDVEN in the last 168 hours.  No results found for this or any previous visit (from the past 240 hour(s)).    Radiology Studies: No results found.    Scheduled Meds: .  stroke: mapping our early stages of recovery book   Does not apply Once  . aspirin  325 mg Oral Daily  . atorvastatin  20 mg Oral q1800  . enoxaparin (LOVENOX) injection  40 mg  Subcutaneous Q24H  . feeding supplement (ENSURE ENLIVE)  237 mL Oral BID BM  . folic acid  1 mg Oral Daily  . lactulose  20 g Oral BID  . metoprolol tartrate  25 mg Oral BID  . multivitamin with minerals  1 tablet Oral Daily  . omega-3 acid ethyl esters  1 g Oral Daily  . pantoprazole  40 mg Oral Daily  . sucralfate  1 g Oral TID WC & HS  . thiamine  100 mg Oral Daily   Or  . thiamine  100 mg Intravenous Daily   Continuous Infusions:   LOS: 82 days      Time spent: 10 minutes   Dessa Phi, DO [facility] 10/13/2019, 12:03 PM   Available via Epic secure chat 7am-7pm After these hours, please refer to coverage provider listed on [email or web address]

## 2019-10-13 NOTE — Progress Notes (Signed)
   10/13/19 2132  MEWS Score  Resp 18  Pulse Rate 72  BP (!) 104/91  Temp 97.8 F (36.6 C)  SpO2 96 %  O2 Device Room Air  MEWS Score  MEWS RR 0  MEWS Pulse 0  MEWS Systolic 0  MEWS LOC 0  MEWS Temp 0  MEWS Score 0  MEWS Score Color Green  MEWS Assessment  Is this an acute change? No  MEWS Guidelines - (patients age 50 and over)  Red - At High Risk for Deterioration Yellow - At risk for Deterioration  1. Go to room and assess patient 2. Validate data. Is this patient's baseline? If data confirmed: 3. Is this an acute change? 4. Administer prn meds/treatments as ordered. 5. Note Sepsis score 6. Review goals of care 7. Sports coach, RRT nurse and Provider. 8. Ask Provider to come to bedside.  9. Document patient condition/interventions/response. 10. Increase frequency of vital signs and focused assessments to at least q15 minutes x 4, then q30 minutes x2. - If stable, then q1h x3, then q4h x3 and then q8h or dept. routine. - If unstable, contact Provider & RRT nurse. Prepare for possible transfer. 11. Add entry in progress notes using the smart phrase ".MEWS". 1. Go to room and assess patient 2. Validate data. Is this patient's baseline? If data confirmed: 3. Is this an acute change? 4. Administer prn meds/treatments as ordered? 5. Note Sepsis score 6. Review goals of care 7. Sports coach and Provider 8. Call RRT nurse as needed. 9. Document patient condition/interventions/response. 10. Increase frequency of vital signs and focused assessments to at least q2h x2. - If stable, then q4h x2 and then q8h or dept. routine. - If unstable, contact Provider & RRT nurse. Prepare for possible transfer. 11. Add entry in progress notes using the smart phrase ".MEWS".  Green - Likely stable Lavender - Comfort Care Only  1. Continue routine/ordered monitoring.  2. Review goals of care. 1. Continue routine/ordered monitoring. 2. Review goals of care.

## 2019-10-13 NOTE — Plan of Care (Signed)
  Problem: Clinical Measurements: Goal: Ability to maintain clinical measurements within normal limits will improve Outcome: Progressing   Problem: Clinical Measurements: Goal: Ability to maintain clinical measurements within normal limits will improve Outcome: Progressing   Problem: Health Behavior/Discharge Planning: Goal: Ability to manage health-related needs will improve Outcome: Progressing   Problem: Nutrition: Goal: Risk of aspiration will decrease Outcome: Progressing Goal: Dietary intake will improve Outcome: Progressing   Problem: Ischemic Stroke/TIA Tissue Perfusion: Goal: Complications of ischemic stroke/TIA will be minimized Outcome: Progressing

## 2019-10-14 DIAGNOSIS — I6340 Cerebral infarction due to embolism of unspecified cerebral artery: Secondary | ICD-10-CM | POA: Diagnosis not present

## 2019-10-14 NOTE — Progress Notes (Signed)
   10/14/19 2104  MEWS Score  Resp 18  Pulse Rate 79  BP 115/86  Temp 97.6 F (36.4 C)  SpO2 99 %  O2 Device Room Air  MEWS Score  MEWS RR 0  MEWS Pulse 0  MEWS Systolic 0  MEWS LOC 0  MEWS Temp 0  MEWS Score 0  MEWS Score Color Green  MEWS Assessment  Is this an acute change? No  MEWS Guidelines - (patients age 50 and over)  Red - At High Risk for Deterioration Yellow - At risk for Deterioration  1. Go to room and assess patient 2. Validate data. Is this patient's baseline? If data confirmed: 3. Is this an acute change? 4. Administer prn meds/treatments as ordered. 5. Note Sepsis score 6. Review goals of care 7. Sports coach, RRT nurse and Provider. 8. Ask Provider to come to bedside.  9. Document patient condition/interventions/response. 10. Increase frequency of vital signs and focused assessments to at least q15 minutes x 4, then q30 minutes x2. - If stable, then q1h x3, then q4h x3 and then q8h or dept. routine. - If unstable, contact Provider & RRT nurse. Prepare for possible transfer. 11. Add entry in progress notes using the smart phrase ".MEWS". 1. Go to room and assess patient 2. Validate data. Is this patient's baseline? If data confirmed: 3. Is this an acute change? 4. Administer prn meds/treatments as ordered? 5. Note Sepsis score 6. Review goals of care 7. Sports coach and Provider 8. Call RRT nurse as needed. 9. Document patient condition/interventions/response. 10. Increase frequency of vital signs and focused assessments to at least q2h x2. - If stable, then q4h x2 and then q8h or dept. routine. - If unstable, contact Provider & RRT nurse. Prepare for possible transfer. 11. Add entry in progress notes using the smart phrase ".MEWS".  Green - Likely stable Lavender - Comfort Care Only  1. Continue routine/ordered monitoring.  2. Review goals of care. 1. Continue routine/ordered monitoring. 2. Review goals of care.

## 2019-10-14 NOTE — Plan of Care (Signed)
  Problem: Clinical Measurements: Goal: Ability to maintain clinical measurements within normal limits will improve Outcome: Progressing   Problem: Clinical Measurements: Goal: Ability to maintain clinical measurements within normal limits will improve Outcome: Progressing   Problem: Health Behavior/Discharge Planning: Goal: Ability to manage health-related needs will improve Outcome: Progressing   Problem: Nutrition: Goal: Risk of aspiration will decrease Outcome: Progressing Goal: Dietary intake will improve Outcome: Progressing   Problem: Ischemic Stroke/TIA Tissue Perfusion: Goal: Complications of ischemic stroke/TIA will be minimized Outcome: Progressing   

## 2019-10-14 NOTE — Progress Notes (Signed)
PROGRESS NOTE    Tyler [name]  IWL:[id number] DOB: January 16, 1971 DOA: 07/23/2019 PCP: Patient, No Pcp Per     Brief Narrative:  Tyler [name] is a 48 year old with past medical history significant for polysubstance abuse including heroine presented to [facility] ER after being found down with a syringe nearby. He was given Narcan x2 with minimal improvement. On presentation he was found to have severe rhabdomyolysis CPK 72,000, elevated liver enzymes AST 2000 ALT 1000 with acute kidney injury BUN 40 creatinine 2.4 and hyperkalemia K 6.2. He was a started on IV fluids, bicarb drip and transferred to [facility] for further care. Initial leukocytosis of 21K, thought to be related to hemoconcentration. UDS negative. Salicylate and acetaminophen negative. CT head and neck were negative for acute traumatic finding. Patient admitted to hospitalist service for rhabdomyolysis, AKI. Rhabdomyolysis has improved, he continued to have persistent encephalopathy and was found to have acute bilateral cerebellar infarct. Neurology was consulted and has subsequently signed off. PT is recommending SNF.Prolonged hospitalization due to placement issues.  New events last 24 hours / Subjective: Sitting in a chair eating breakfast without any physical complaints.  No acute issues to report  Assessment & Plan:   Principal Problem:   Cerebral embolism with cerebral infarction Active Problems:   Rhabdomyolysis   Acute kidney failure (HCC)   Acute metabolic encephalopathy   Polysubstance abuse (HCC)   Hyperkalemia   Transaminitis   HTN (hypertension)   Protein-calorie malnutrition, severe    Acute bilateral ischemic CVA:  - MRI of the brain show acute CVA - MRA negative - Ultrasound carotid showed bilateral 1 to 39% ICA stenosis - Hemoglobin A1c 5.1 - Echo showed normal ejection fraction - Lower extremity Dopplers negative for DVT - Patient was evaluated by neurology  who recommend 30-day cardiac monitor event monitor  - Aspirin, lipitor   Acute metabolic encephalopathy, POA, multifactorial, now chronic: - Likely secondary to CVA in the setting of alcohol drug abuse - UDS was negative. Tylenol and salicylate are negative - TSH and B12 normal limits - EEG showed moderate diffuse encephalopathy which is nonspecific to etiology but no seizures activity - Waxing and waning mentation, stable   Severe rhabdomyolysis AKI: - Resolved with IV fluids - Renal ultrasound negative  Transaminases with elevated bilirubin:  - Related to alcohol as well as rhabdo - Right upper quadrant ultrasound showed hepatic steatosis with early cirrhosis, with some gallbladder thickening - Resolved  Alcohol abuse:  - Thiamine, multivitamin, folic acid  UTI  - Completed 5 days Rocephin  Hypertension - Continue metoprolol   Severe protein-calorie malnutrition  - Nutrition support  Abdominal pain, back pain - X-ray showed unchanged T-11 compression fracture, age indeterminate L-1 fracture - Resolved   DVT prophylaxis: Lovenox Code Status: Full Family Communication: None at bedside Disposition Plan: Pending SNF placement   Consultants:   Neurology    Antimicrobials:  Anti-infectives (From admission, onward)   Start     Dose/Rate Route Frequency Ordered Stop   07/25/19 0745  cefTRIAXone (ROCEPHIN) 1 g in sodium chloride 0.9 % 100 mL IVPB     1 g 200 mL/hr over 30 Minutes Intravenous Every 24 hours 07/25/19 0734 07/29/19 1518       Objective: Vitals:   10/13/19 1620 10/13/19 2132 10/14/19 0449 10/14/19 1015  BP: 122/89 (!) 104/91 (!) 123/91 (!) 145/102  Pulse: 69 72 66 72  Resp: 16 18 18    Temp: 98.3 F (36.8 C) 97.8 F (36.6 C) 98.2 F (36.8 C)  TempSrc: Oral  Oral   SpO2: 98% 96% 98%   Weight:   66.4 kg   Height:        Intake/Output Summary (Last 24 hours) at  10/14/2019 1047 Last data filed at 10/14/2019 0505 Gross per 24 hour  Intake -  Output 3 ml  Net -3 ml   Filed Weights   10/06/19 0408 10/08/19 0600 10/14/19 0449  Weight: 66 kg 83 kg 66.4 kg    Examination: General exam: Appears calm and comfortable    Data Reviewed: I have personally reviewed following labs and imaging studies  CBC: No results for input(s): WBC, NEUTROABS, HGB, HCT, MCV, PLT in the last 168 hours. Basic Metabolic Panel: No results for input(s): NA, K, CL, CO2, GLUCOSE, BUN, CREATININE, CALCIUM, MG, PHOS in the last 168 hours. GFR: Estimated Creatinine Clearance: 72.2 mL/min (by C-G formula based on SCr of 1.15 mg/dL). Liver Function Tests: No results for input(s): AST, ALT, ALKPHOS, BILITOT, PROT, ALBUMIN in the last 168 hours. No results for input(s): LIPASE, AMYLASE in the last 168 hours. No results for input(s): AMMONIA in the last 168 hours. Coagulation Profile: No results for input(s): INR, PROTIME in the last 168 hours. Cardiac Enzymes: No results for input(s): CKTOTAL, CKMB, CKMBINDEX, TROPONINI in the last 168 hours. BNP (last 3 results) No results for input(s): PROBNP in the last 8760 hours. HbA1C: No results for input(s): HGBA1C in the last 72 hours. CBG: No results for input(s): GLUCAP in the last 168 hours. Lipid Profile: No results for input(s): CHOL, HDL, LDLCALC, TRIG, CHOLHDL, LDLDIRECT in the last 72 hours. Thyroid Function Tests: No results for input(s): TSH, T4TOTAL, FREET4, T3FREE, THYROIDAB in the last 72 hours. Anemia Panel: No results for input(s): VITAMINB12, FOLATE, FERRITIN, TIBC, IRON, RETICCTPCT in the last 72 hours. Sepsis Labs: No results for input(s): PROCALCITON, LATICACIDVEN in the last 168 hours.  No results found for this or any previous visit (from the past 240 hour(s)).    Radiology Studies: No results found.    Scheduled Meds: .  stroke: mapping our early stages of recovery book   Does not apply Once  .  aspirin  325 mg Oral Daily  . atorvastatin  20 mg Oral q1800  . enoxaparin (LOVENOX) injection  40 mg Subcutaneous Q24H  . feeding supplement (ENSURE ENLIVE)  237 mL Oral BID BM  . folic acid  1 mg Oral Daily  . lactulose  20 g Oral BID  . metoprolol tartrate  25 mg Oral BID  . multivitamin with minerals  1 tablet Oral Daily  . omega-3 acid ethyl esters  1 g Oral Daily  . pantoprazole  40 mg Oral Daily  . sucralfate  1 g Oral TID WC & HS  . thiamine  100 mg Oral Daily   Or  . thiamine  100 mg Intravenous Daily   Continuous Infusions:   LOS: 83 days      Time spent: 10 minutes   Noralee Stain, DO [facility] 10/14/2019, 10:47 AM   Available via Epic secure chat 7am-7pm After these hours, please refer to coverage provider listed on [email or web address]

## 2019-10-15 DIAGNOSIS — I6340 Cerebral infarction due to embolism of unspecified cerebral artery: Secondary | ICD-10-CM | POA: Diagnosis not present

## 2019-10-15 LAB — GLUCOSE, CAPILLARY
Glucose-Capillary: 106 mg/dL — ABNORMAL HIGH (ref 70–99)
Glucose-Capillary: 96 mg/dL (ref 70–99)
Glucose-Capillary: 97 mg/dL (ref 70–99)

## 2019-10-15 NOTE — Progress Notes (Signed)
PROGRESS NOTE    Tyler [name]  FWY:[id number] DOB: July 18, 1971 DOA: 07/23/2019 PCP: Patient, No Pcp Per     Brief Narrative:  Tyler [name] is a 48 year old with past medical history significant for polysubstance abuse including heroine presented to [facility] ER after being found down with a syringe nearby. He was given Narcan x2 with minimal improvement. On presentation he was found to have severe rhabdomyolysis CPK 72,000, elevated liver enzymes AST 2000 ALT 1000 with acute kidney injury BUN 40 creatinine 2.4 and hyperkalemia K 6.2. He was a started on IV fluids, bicarb drip and transferred to [facility] for further care. Initial leukocytosis of 21K, thought to be related to hemoconcentration. UDS negative. Salicylate and acetaminophen negative. CT head and neck were negative for acute traumatic finding. Patient admitted to hospitalist service for rhabdomyolysis, AKI. Rhabdomyolysis has improved, he continued to have persistent encephalopathy and was found to have acute bilateral cerebellar infarct. Neurology was consulted and has subsequently signed off. PT is recommending SNF.Prolonged hospitalization due to placement issues.  New events last 24 hours / Subjective: No new complaints today   Assessment & Plan:   Principal Problem:   Cerebral embolism with cerebral infarction Active Problems:   Rhabdomyolysis   Acute kidney failure (HCC)   Acute metabolic encephalopathy   Polysubstance abuse (HCC)   Hyperkalemia   Transaminitis   HTN (hypertension)   Protein-calorie malnutrition, severe    Acute bilateral ischemic CVA:  - MRI of the brain show acute CVA - MRA negative - Ultrasound carotid showed bilateral 1 to 39% ICA stenosis - Hemoglobin A1c 5.1 - Echo showed normal ejection fraction - Lower extremity Dopplers negative for DVT - Patient was evaluated by neurology who recommend 30-day cardiac monitor event monitor  - Aspirin,  lipitor   Acute metabolic encephalopathy, POA, multifactorial, now chronic: - Likely secondary to CVA in the setting of alcohol drug abuse - UDS was negative. Tylenol and salicylate are negative - TSH and B12 normal limits - EEG showed moderate diffuse encephalopathy which is nonspecific to etiology but no seizures activity - Waxing and waning mentation, stable   Severe rhabdomyolysis AKI: - Resolved with IV fluids - Renal ultrasound negative  Transaminases with elevated bilirubin:  - Related to alcohol as well as rhabdo - Right upper quadrant ultrasound showed hepatic steatosis with early cirrhosis, with some gallbladder thickening - Resolved  Alcohol abuse:  - Thiamine, multivitamin, folic acid  UTI  - Completed 5 days Rocephin  Hypertension - Continue metoprolol   Severe protein-calorie malnutrition  - Nutrition support  Abdominal pain, back pain - X-ray showed unchanged T-11 compression fracture, age indeterminate L-1 fracture - Resolved   DVT prophylaxis: Lovenox Code Status: Full Family Communication: None at bedside Disposition Plan: Pending SNF placement   Consultants:   Neurology    Antimicrobials:  Anti-infectives (From admission, onward)   Start     Dose/Rate Route Frequency Ordered Stop   07/25/19 0745  cefTRIAXone (ROCEPHIN) 1 g in sodium chloride 0.9 % 100 mL IVPB     1 g 200 mL/hr over 30 Minutes Intravenous Every 24 hours 07/25/19 0734 07/29/19 1518       Objective: Vitals:   10/14/19 2104 10/15/19 0554 10/15/19 0929 10/15/19 1252  BP: 115/86 (!) 111/92 (!) 118/94 (!) 110/97  Pulse: 79 (!) 59 60 61  Resp: 18 18 18 18   Temp: 97.6 F (36.4 C) 97.7 F (36.5 C) 98 F (36.7 C) 98.3 F (36.8 C)  TempSrc: Oral Oral Oral Oral  SpO2: 99% 99% 100% 100%  Weight:      Height:       No intake or output data in the 24 hours ending 10/15/19 1320 Filed Weights   10/06/19  0408 10/08/19 0600 10/14/19 0449  Weight: 66 kg 83 kg 66.4 kg    Examination: General exam: Appears calm and comfortable     Data Reviewed: I have personally reviewed following labs and imaging studies  CBC: No results for input(s): WBC, NEUTROABS, HGB, HCT, MCV, PLT in the last 168 hours. Basic Metabolic Panel: No results for input(s): NA, K, CL, CO2, GLUCOSE, BUN, CREATININE, CALCIUM, MG, PHOS in the last 168 hours. GFR: Estimated Creatinine Clearance: 72.2 mL/min (by C-G formula based on SCr of 1.15 mg/dL). Liver Function Tests: No results for input(s): AST, ALT, ALKPHOS, BILITOT, PROT, ALBUMIN in the last 168 hours. No results for input(s): LIPASE, AMYLASE in the last 168 hours. No results for input(s): AMMONIA in the last 168 hours. Coagulation Profile: No results for input(s): INR, PROTIME in the last 168 hours. Cardiac Enzymes: No results for input(s): CKTOTAL, CKMB, CKMBINDEX, TROPONINI in the last 168 hours. BNP (last 3 results) No results for input(s): PROBNP in the last 8760 hours. HbA1C: No results for input(s): HGBA1C in the last 72 hours. CBG: Recent Labs  Lab 10/15/19 0755  GLUCAP 106*   Lipid Profile: No results for input(s): CHOL, HDL, LDLCALC, TRIG, CHOLHDL, LDLDIRECT in the last 72 hours. Thyroid Function Tests: No results for input(s): TSH, T4TOTAL, FREET4, T3FREE, THYROIDAB in the last 72 hours. Anemia Panel: No results for input(s): VITAMINB12, FOLATE, FERRITIN, TIBC, IRON, RETICCTPCT in the last 72 hours. Sepsis Labs: No results for input(s): PROCALCITON, LATICACIDVEN in the last 168 hours.  No results found for this or any previous visit (from the past 240 hour(s)).    Radiology Studies: No results found.    Scheduled Meds: .  stroke: mapping our early stages of recovery book   Does not apply Once  . aspirin  325 mg Oral Daily  . atorvastatin  20 mg Oral q1800  . enoxaparin (LOVENOX) injection  40 mg Subcutaneous Q24H  . feeding  supplement (ENSURE ENLIVE)  237 mL Oral BID BM  . folic acid  1 mg Oral Daily  . lactulose  20 g Oral BID  . metoprolol tartrate  25 mg Oral BID  . multivitamin with minerals  1 tablet Oral Daily  . omega-3 acid ethyl esters  1 g Oral Daily  . pantoprazole  40 mg Oral Daily  . sucralfate  1 g Oral TID WC & HS  . thiamine  100 mg Oral Daily   Or  . thiamine  100 mg Intravenous Daily   Continuous Infusions:   LOS: 84 days      Time spent: 10 minutes   Noralee Stain, DO [facility] 10/15/2019, 1:20 PM   Available via Epic secure chat 7am-7pm After these hours, please refer to coverage provider listed on [email or web address]

## 2019-10-15 NOTE — Plan of Care (Signed)
  Problem: Nutrition: Goal: Risk of aspiration will decrease Outcome: Progressing   Problem: Ischemic Stroke/TIA Tissue Perfusion: Goal: Complications of ischemic stroke/TIA will be minimized Outcome: Progressing   Problem: Clinical Measurements: Goal: Ability to maintain clinical measurements within normal limits will improve Outcome: Progressing

## 2019-10-16 DIAGNOSIS — I6340 Cerebral infarction due to embolism of unspecified cerebral artery: Secondary | ICD-10-CM | POA: Diagnosis not present

## 2019-10-16 MED ORDER — METOPROLOL TARTRATE 12.5 MG HALF TABLET
12.5000 mg | ORAL_TABLET | Freq: Once | ORAL | Status: AC
  Administered 2019-10-16: 12.5 mg via ORAL
  Filled 2019-10-16: qty 1

## 2019-10-16 NOTE — Progress Notes (Signed)
Patient's BP tonight is 95/72 with HR of 72. Paged Kirby,NP about patient's BP and patient's scheduled dose of 25mg  of metoprolol tonight. Kirby,NP placed order for patient to receive a dose of 12.5mg  of metoprolol PO instead of the 25mg  of PO metoprolol.

## 2019-10-16 NOTE — Progress Notes (Signed)
PROGRESS NOTE    Tyler [name]  GGY:[id number] DOB: 07-Sep-1971 DOA: 07/23/2019 PCP: Patient, No Pcp Per     Brief Narrative:  Tyler [name] is a 48 year old with past medical history significant for polysubstance abuse including heroine presented to [facility] ER after being found down with a syringe nearby. He was given Narcan x2 with minimal improvement. On presentation he was found to have severe rhabdomyolysis CPK 72,000, elevated liver enzymes AST 2000 ALT 1000 with acute kidney injury BUN 40 creatinine 2.4 and hyperkalemia K 6.2. He was a started on IV fluids, bicarb drip and transferred to [facility] for further care. Initial leukocytosis of 21K, thought to be related to hemoconcentration. UDS negative. Salicylate and acetaminophen negative. CT head and neck were negative for acute traumatic finding. Patient admitted to hospitalist service for rhabdomyolysis, AKI. Rhabdomyolysis has improved, he continued to have persistent encephalopathy and was found to have acute bilateral cerebellar infarct. Neurology was consulted and has subsequently signed off. PT is recommending SNF.Prolonged hospitalization due to placement issues.  New events last 24 hours / Subjective: No issues overnight   Assessment & Plan:   Principal Problem:   Cerebral embolism with cerebral infarction Active Problems:   Rhabdomyolysis   Acute kidney failure (HCC)   Acute metabolic encephalopathy   Polysubstance abuse (HCC)   Hyperkalemia   Transaminitis   HTN (hypertension)   Protein-calorie malnutrition, severe    Acute bilateral ischemic CVA:  - MRI of the brain show acute CVA - MRA negative - Ultrasound carotid showed bilateral 1 to 39% ICA stenosis - Hemoglobin A1c 5.1 - Echo showed normal ejection fraction - Lower extremity Dopplers negative for DVT - Patient was evaluated by neurology who recommend 30-day cardiac monitor event monitor  - Aspirin, lipitor    Acute metabolic encephalopathy, POA, multifactorial, now chronic: - Likely secondary to CVA in the setting of alcohol drug abuse - UDS was negative. Tylenol and salicylate are negative - TSH and B12 normal limits - EEG showed moderate diffuse encephalopathy which is nonspecific to etiology but no seizures activity - Waxing and waning mentation, stable   Severe rhabdomyolysis AKI: - Resolved with IV fluids - Renal ultrasound negative  Transaminases with elevated bilirubin:  - Related to alcohol as well as rhabdo - Right upper quadrant ultrasound showed hepatic steatosis with early cirrhosis, with some gallbladder thickening - Resolved  Alcohol abuse:  - Thiamine, multivitamin, folic acid  UTI  - Completed 5 days Rocephin  Hypertension - Continue metoprolol   Severe protein-calorie malnutrition  - Nutrition support  Abdominal pain, back pain - X-ray showed unchanged T-11 compression fracture, age indeterminate L-1 fracture - Resolved   DVT prophylaxis: Lovenox Code Status: Full Family Communication: None at bedside Disposition Plan: Pending SNF placement   Consultants:   Neurology    Antimicrobials:  Anti-infectives (From admission, onward)   Start     Dose/Rate Route Frequency Ordered Stop   07/25/19 0745  cefTRIAXone (ROCEPHIN) 1 g in sodium chloride 0.9 % 100 mL IVPB     1 g 200 mL/hr over 30 Minutes Intravenous Every 24 hours 07/25/19 0734 07/29/19 1518       Objective: Vitals:   10/15/19 0929 10/15/19 1252 10/15/19 2143 10/16/19 0543  BP: (!) 118/94 (!) 110/97 106/80 116/76  Pulse: 60 61 64 65  Resp: 18 18 18 18   Temp: 98 F (36.7 C) 98.3 F (36.8 C) 98.5 F (36.9 C) 98 F (36.7 C)  TempSrc: Oral Oral Oral Oral  SpO2: 100%  100% 100% 100%  Weight:      Height:        Intake/Output Summary (Last 24 hours) at 10/16/2019 1018 Last data filed at 10/16/2019 0904 Gross per  24 hour  Intake 476 ml  Output 250 ml  Net 226 ml   Filed Weights   10/06/19 0408 10/08/19 0600 10/14/19 0449  Weight: 66 kg 83 kg 66.4 kg    Examination: General exam: Appears calm and comfortable    Data Reviewed: I have personally reviewed following labs and imaging studies  CBC: No results for input(s): WBC, NEUTROABS, HGB, HCT, MCV, PLT in the last 168 hours. Basic Metabolic Panel: No results for input(s): NA, K, CL, CO2, GLUCOSE, BUN, CREATININE, CALCIUM, MG, PHOS in the last 168 hours. GFR: Estimated Creatinine Clearance: 72.2 mL/min (by C-G formula based on SCr of 1.15 mg/dL). Liver Function Tests: No results for input(s): AST, ALT, ALKPHOS, BILITOT, PROT, ALBUMIN in the last 168 hours. No results for input(s): LIPASE, AMYLASE in the last 168 hours. No results for input(s): AMMONIA in the last 168 hours. Coagulation Profile: No results for input(s): INR, PROTIME in the last 168 hours. Cardiac Enzymes: No results for input(s): CKTOTAL, CKMB, CKMBINDEX, TROPONINI in the last 168 hours. BNP (last 3 results) No results for input(s): PROBNP in the last 8760 hours. HbA1C: No results for input(s): HGBA1C in the last 72 hours. CBG: Recent Labs  Lab 10/15/19 0755 10/15/19 1216 10/15/19 1650  GLUCAP 106* 96 97   Lipid Profile: No results for input(s): CHOL, HDL, LDLCALC, TRIG, CHOLHDL, LDLDIRECT in the last 72 hours. Thyroid Function Tests: No results for input(s): TSH, T4TOTAL, FREET4, T3FREE, THYROIDAB in the last 72 hours. Anemia Panel: No results for input(s): VITAMINB12, FOLATE, FERRITIN, TIBC, IRON, RETICCTPCT in the last 72 hours. Sepsis Labs: No results for input(s): PROCALCITON, LATICACIDVEN in the last 168 hours.  No results found for this or any previous visit (from the past 240 hour(s)).    Radiology Studies: No results found.    Scheduled Meds: .  stroke: mapping our early stages of recovery book   Does not apply Once  . aspirin  325 mg Oral  Daily  . atorvastatin  20 mg Oral q1800  . enoxaparin (LOVENOX) injection  40 mg Subcutaneous Q24H  . feeding supplement (ENSURE ENLIVE)  237 mL Oral BID BM  . folic acid  1 mg Oral Daily  . lactulose  20 g Oral BID  . metoprolol tartrate  25 mg Oral BID  . multivitamin with minerals  1 tablet Oral Daily  . omega-3 acid ethyl esters  1 g Oral Daily  . pantoprazole  40 mg Oral Daily  . sucralfate  1 g Oral TID WC & HS  . thiamine  100 mg Oral Daily   Or  . thiamine  100 mg Intravenous Daily   Continuous Infusions:   LOS: 85 days      Time spent: 10 minutes   Noralee Stain, DO [facility] 10/16/2019, 10:18 AM   Available via Epic secure chat 7am-7pm After these hours, please refer to coverage provider listed on [email or web address]

## 2019-10-16 NOTE — Progress Notes (Signed)
Occupational Therapy Treatment Patient Details Name: Tyler [name] MRN: [id number] DOB: 1971/10/06 Today's Date: 10/16/2019    History of present illness 48 y/o male transferred from [place] for unresponsiveness, likely secondary to substance abuse. Pt with rhabdomyolysis and AKI. EEG revealed moderate diffuse encephalopathy. PMH includes polysubstance abuse and HTN. Imaging show acute bialteral cerebellar infarcts.    OT comments  Pt progressing toward stated goals. Took standing shower this date with supervision level of assist. Implemented cognitive challenge for pt by asking him to name supplies needed to shower and verbalize sequencing and safety. Continuing to need verbal cues for successful completion at this time. Pt then standing at sink to shave face. Safety cues needed, as well as cues to attend to entire face and not just the middle. Pt then nicked self with razor, and attempting to shave over same spot until safety cues given. Pt asked to orient self and stated he was at the [facility] and it was 1982. When re oriented pt could not recall after ~3-5 minutes. Pt then asking to call dtr for clothes. Wrote phone number out in attempt for pt to use phone. Pt could not appropriately read the numbers or sequence the task. Will continue to assess vision and continue acute OT services for continues cognitive rehabilitation. Will continue to follow per POC below. Goals updated to reflect progress.   Follow Up Recommendations  SNF;Supervision/Assistance - 24 hour    Equipment Recommendations  None recommended by OT    Recommendations for Other Services      Precautions / Restrictions Precautions Precautions: Fall Restrictions Weight Bearing Restrictions: No       Mobility Bed Mobility               General bed mobility comments: Pt sitting on EOB upon arrival  Transfers Overall transfer level: Needs assistance Equipment used: None Transfers: Sit to/from  Stand Sit to Stand: Supervision         General transfer comment: supervision in and out of shower this date    Balance Overall balance assessment: Needs assistance Sitting-balance support: Feet supported Sitting balance-Leahy Scale: Normal     Standing balance support: During functional activity;No upper extremity supported Standing balance-Leahy Scale: Good Standing balance comment: able to shower standing this date                           ADL either performed or assessed with clinical judgement   ADL Overall ADL's : Needs assistance/impaired     Grooming: Supervision/safety;Standing;Cueing for sequencing;Cueing for safety Grooming Details (indicate cue type and reason): standing at sink to shave face. Cues for sequencing and safety when using razor Upper Body Bathing: Supervision/ safety;Standing   Lower Body Bathing: Supervison/ safety;Sitting/lateral leans Lower Body Bathing Details (indicate cue type and reason): standing level shower today                     Functional mobility during ADLs: Supervision/safety;Cueing for safety;Cueing for sequencing General ADL Comments: continues to demonstrate cognitive deficits, able to take standing shower this date and stand to groom     Vision Baseline Vision/History: No visual deficits Additional Comments: pt not able to read numbers at reading distance this date. Will continue to assess to see if reading glasses are indicated or other possible neuro deficits   Perception     Praxis      Cognition Arousal/Alertness: Awake/alert Behavior During Therapy: [facility] for tasks assessed/performed  Overall Cognitive Status: Impaired/Different from baseline Area of Impairment: Attention;Safety/judgement;Awareness;Orientation                 Orientation Level: Disoriented to;Place;Time;Situation Current Attention Level: Sustained Memory: Decreased short-term memory Following Commands: Follows one step  commands inconsistently;Follows one step commands with increased time Safety/Judgement: Decreased awareness of deficits;Decreased awareness of safety Awareness: Intellectual Problem Solving: Requires verbal cues;Requires tactile cues;Slow processing General Comments: needing safety cues to shaev this date. Knicked self with razor and attempted to shave over same spot. Stated he was at the [facility] and the year was 1982        Exercises     Shoulder Instructions       General Comments      Pertinent Vitals/ Pain       Pain Assessment: No/denies pain  Home Living                                          Prior Functioning/Environment              Frequency  Min 1X/week        Progress Toward Goals  OT Goals(current goals can now be found in the care plan section)  Progress towards OT goals: Progressing toward goals  Acute Rehab OT Goals Patient Stated Goal: shave my face OT Goal Formulation: With patient Time For Goal Achievement: 10/30/19 Potential to Achieve Goals: Good  Plan Discharge plan remains appropriate    Co-evaluation                 AM-PAC OT "6 Clicks" Daily Activity     Outcome Measure   Help from another person eating meals?: None Help from another person taking care of personal grooming?: A Little Help from another person toileting, which includes using toliet, bedpan, or urinal?: A Little Help from another person bathing (including washing, rinsing, drying)?: A Little Help from another person to put on and taking off regular upper body clothing?: A Little Help from another person to put on and taking off regular lower body clothing?: A Little 6 Click Score: 19    End of Session    OT Visit Diagnosis: Other symptoms and signs involving cognitive function;Unsteadiness on feet (R26.81);Other abnormalities of gait and mobility (R26.89)   Activity Tolerance Patient tolerated treatment well   Patient Left in  bed;with bed alarm set;with call bell/phone within reach   Nurse Communication Mobility status        Time: 0867-6195 OT Time Calculation (min): 31 min  Charges: OT General Charges $OT Visit: 1 Visit OT Treatments $Self Care/Home Management : 23-37 mins  Dalphine Handing, MSOT, OTR/L Behavioral Health OT/ Acute Relief OT [facility] Office: [phone]  Dalphine Handing 10/16/2019, 1:27 PM

## 2019-10-16 NOTE — Progress Notes (Signed)
Physical Therapy Treatment Patient Details Name: Tyler [name] MRN: [id number] DOB: 1971/11/21 Today's Date: 10/16/2019    History of Present Illness 48 y/o male transferred from [place] [facility] for unresponsiveness, likely secondary to substance abuse. Pt with rhabdomyolysis and AKI. EEG revealed moderate diffuse encephalopathy. PMH includes polysubstance abuse and HTN. Imaging show acute bialteral cerebellar infarcts.     PT Comments    Pt attention to R side is improving and was steady with gait today, >500' without AD but safety awareness is still poor and pt has no ability to path find, cannot discern R from L or find room numbers on unit. Continually calling a "w" a "4" and shows no learning with repeated practice. Continues to be unsafe to be alone. PT will continue to follow.    Follow Up Recommendations  SNF;Supervision/Assistance - 24 hour     Equipment Recommendations  None recommended by PT    Recommendations for Other Services       Precautions / Restrictions Precautions Precautions: Fall Precaution Comments: poor safety awareness Restrictions Weight Bearing Restrictions: No    Mobility  Bed Mobility Overal bed mobility: Modified Independent Bed Mobility: Supine to Sit       Sit to supine: (tactile cuing to initiate)   General bed mobility comments: able to come to EOB without assist  Transfers Overall transfer level: Needs assistance Equipment used: None Transfers: Sit to/from Stand Sit to Stand: Supervision         General transfer comment: pt steady but impulsive, supervision for safety  Ambulation/Gait Ambulation/Gait assistance: Supervision Gait Distance (Feet): 550 Feet Assistive device: None Gait Pattern/deviations: Step-through pattern Gait velocity: WFL Gait velocity interpretation: >2.62 ft/sec, indicative of community ambulatory General Gait Details: did not run into object while walking today and was steady but had to stop each  time he needed to think. Worked on bkwds walking and change of direction   Stairs Stairs: Yes Stairs assistance: Supervision Stair Management: Alternating pattern;Forwards;One rail Right Number of Stairs: 12 General stair comments: pt cued to use right rail (last session practice with stairs he would not do this) and pt followed command and grasped R rail   Wheelchair Mobility    Modified Rankin (Stroke Patients Only) Modified Rankin (Stroke Patients Only) Pre-Morbid Rankin Score: No symptoms Modified Rankin: Moderate disability     Balance Overall balance assessment: Needs assistance Sitting-balance support: Feet supported Sitting balance-Leahy Scale: Normal Sitting balance - Comments: able to sit unsupport with no LOB   Standing balance support: During functional activity;No upper extremity supported Standing balance-Leahy Scale: Good Standing balance comment: able to shower standing this date                            Cognition Arousal/Alertness: Awake/alert Behavior During Therapy: WFL for tasks assessed/performed Overall Cognitive Status: Impaired/Different from baseline Area of Impairment: Attention;Safety/judgement;Awareness;Orientation;Memory                 Orientation Level: Disoriented to;Place;Time;Situation Current Attention Level: Sustained Memory: Decreased short-term memory Following Commands: Follows one step commands inconsistently;Follows one step commands with increased time Safety/Judgement: Decreased awareness of deficits;Decreased awareness of safety Awareness: Intellectual Problem Solving: Requires verbal cues;Requires tactile cues;Slow processing General Comments: Pt thought he was at [employer] today, [place] his mask from his nose at first. Could not remember his R from L but once cued, remembered the rest of the session. Could not complete simple > and < number questions, and could  not path find around unit. Worked on all of  these skills during session      Exercises      General Comments General comments (skin integrity, edema, etc.): VSS      Pertinent Vitals/Pain Pain Assessment: No/denies pain    Home Living                      Prior Function            PT Goals (current goals can now be found in the care plan section) Acute Rehab PT Goals Patient Stated Goal: shave my face PT Goal Formulation: Patient unable to participate in goal setting Time For Goal Achievement: 10/19/19 Potential to Achieve Goals: Fair Progress towards PT goals: Progressing toward goals    Frequency    Min 2X/week      PT Plan Current plan remains appropriate    Co-evaluation              AM-PAC PT "6 Clicks" Mobility   Outcome Measure  Help needed turning from your back to your side while in a flat bed without using bedrails?: None Help needed moving from lying on your back to sitting on the side of a flat bed without using bedrails?: None Help needed moving to and from a bed to a chair (including a wheelchair)?: None Help needed standing up from a chair using your arms (e.g., wheelchair or bedside chair)?: None Help needed to walk in hospital room?: None Help needed climbing 3-5 steps with a railing? : A Little 6 Click Score: 23    End of Session Equipment Utilized During Treatment: Gait belt Activity Tolerance: Patient tolerated treatment well Patient left: with call bell/phone within reach;in chair;with chair alarm set Nurse Communication: Mobility status PT Visit Diagnosis: Unsteadiness on feet (R26.81);Difficulty in walking, not elsewhere classified (R26.2);Other symptoms and signs involving the nervous system (R29.898)     Time: 6720-9470 PT Time Calculation (min) (ACUTE ONLY): 18 min  Charges:  $Gait Training: 8-22 mins                     Leighton Roach, [address]  Pager [phone] Office [place] 10/16/2019, 1:38 PM

## 2019-10-17 DIAGNOSIS — I6340 Cerebral infarction due to embolism of unspecified cerebral artery: Secondary | ICD-10-CM | POA: Diagnosis not present

## 2019-10-17 DIAGNOSIS — I10 Essential (primary) hypertension: Secondary | ICD-10-CM | POA: Diagnosis not present

## 2019-10-17 DIAGNOSIS — G9341 Metabolic encephalopathy: Secondary | ICD-10-CM | POA: Diagnosis not present

## 2019-10-17 DIAGNOSIS — N170 Acute kidney failure with tubular necrosis: Secondary | ICD-10-CM | POA: Diagnosis not present

## 2019-10-17 MED ORDER — DICLOFENAC SODIUM 1 % TD GEL
2.0000 g | Freq: Three times a day (TID) | TRANSDERMAL | Status: DC
  Administered 2019-10-17 – 2019-11-03 (×58): 2 g via TOPICAL
  Filled 2019-10-17 (×3): qty 100

## 2019-10-17 NOTE — Progress Notes (Signed)
PROGRESS NOTE    Tyler [name]  SPQ:[id number] DOB: 1971-01-21 DOA: 07/23/2019 PCP: Patient, No Pcp Per   Brief Narrative:  HPI on 07/23/2019 by Dr. Jennette Kettle [name] is a 48 y.o. male with medical history significant of polysubstance abuse including heroin.  Brought in to [facility] ED just before [place] after being found down by friend last evening with nearby syringe.    Interim history He was given Narcan x2 with minimal improvement. On presentation he was found to have severe rhabdomyolysis CPK 72,000, elevated liver enzymes AST 2000 ALT 1000 with acute kidney injury BUN 40 creatinine 2.4 and hyperkalemia K 6.2. He was a started on IV fluids, bicarb drip and transferred to [facility] for further care. Initial leukocytosis of 21K, thought to be related to hemoconcentration. UDS negative. Salicylate and acetaminophen negative. CT head and neck were negative for acute traumatic finding. Patient admitted to hospitalist service for rhabdomyolysis, AKI. Rhabdomyolysis has improved, he continued to have persistent encephalopathy and was found to have acute bilateral cerebellar infarct. Neurology was consulted and has subsequently signed off. PT is recommending SNF.Prolonged hospitalization due to placement issues. Assessment & Plan   Acute bilateral ischemic CVA -MRI brain showed acute CVA -MRA unremarkable -Carotid ultrasound showed bilateral 1 to 39% ICA stent -Hemoglobin A1c 5.1, LDL 132 -Echocardiogram EF 33-35%, LV diastolic Doppler parameters consistent with impaired relaxation.  No evidence of LV regional abnormalities -Lower extremity Dopplers negative for DVT -Neurology consulted and appreciated, recommended 30-day cardiac event monitor -Continue statin, aspirin  Acute metabolic encephalopathy -Present on admission -Multifactorial including CVA, alcohol and drug abuse -UDS was negative, Tylenol and salicylate levels negative -TSH and B12 levels normal -EEG showed  moderate diffuse encephalopathy which is nonspecific to etiology but no seizure activity -Waxing and waning mentation, currently stable  Severe rhabdomyolysis, AKI -Resolved with IV fluids -Renal ultrasound unremarkable  Transaminitis with elevated bilirubin -Likely related to alcohol and rhabdomyolysis -RUQ ultrasound showed hepatic steatosis with early cirrhosis, some gallbladder-8 -Resolved  Alcohol abuse -Continue thiamine, multivitamin, folic acid -Currently no withdrawal symptoms  UTI -Completed 5 days of Rocephin  Essential hypertension -Continue metoprolol  Severe protein calorie malnutrition -Continue nutritional support  Abdominal pain/back pain -X-ray showed unchanged T11 compression fracture, age-indeterminate L1 fracture -ordered Voltaren gel   Physical deconditioning -Likely secondary to alcoholism, CVA, prolonged hospitalization -PT, OT recommending SNF -Patient continues to need several cues when doing self-care tasks as with walking.  Does not appear to be safe to be left alone.  DVT Prophylaxis  lovenox  Code Status: Full  Family Communication: None at bedside  Disposition Plan: Admitted. Pending placement  Consultants Neurology   Procedures  Echocardiogram Renal US  Antibiotics   Anti-infectives (From admission, onward)   Start     Dose/Rate Route Frequency Ordered Stop   07/25/19 0745  cefTRIAXone (ROCEPHIN) 1 g in sodium chloride 0.9 % 100 mL IVPB     1 g 200 mL/hr over 30 Minutes Intravenous Every 24 hours 07/25/19 0734 07/29/19 1518      Subjective:   Tyler [name] seen and examined today.  Complains of feeling sore especially in his abdominal muscles, worsens with movement.  Denies current chest pain, shortness of breath, nausea or vomiting, diarrhea or constipation, dizziness or headache.  Objective:   Vitals:   10/16/19 1308 10/16/19 2138 10/17/19 0546 10/17/19 0655  BP: 116/90 95/72 96/74    Pulse: 69 72 72   Resp: 19 16  16    Temp: 98.2 F (36.8 C)  98.7 F (37.1 C) 98.1 F (36.7 C)   TempSrc: Oral Oral Oral   SpO2: 98% 98% 96%   Weight:    86.2 kg  Height:        Intake/Output Summary (Last 24 hours) at 10/17/2019 1229 Last data filed at 10/16/2019 1723 Gross per 24 hour  Intake 237 ml  Output --  Net 237 ml   Filed Weights   10/08/19 0600 10/14/19 0449 10/17/19 0655  Weight: 83 kg 66.4 kg 86.2 kg    Exam  General: Well developed, well nourished, NAD, appears stated age  HEENT: NCAT, mucous membranes moist.   Cardiovascular: S1 S2 auscultated, RRR  Respiratory: Clear to auscultation bilaterally  Abdomen: Soft, nontender, nondistended, + bowel sounds  Extremities: warm dry without cyanosis clubbing or edema  Neuro: AAOx2 (person, place), nonfocal  Psych: Appropriate   Data Reviewed: I have personally reviewed following labs and imaging studies  CBC: No results for input(s): WBC, NEUTROABS, HGB, HCT, MCV, PLT in the last 168 hours. Basic Metabolic Panel: No results for input(s): NA, K, CL, CO2, GLUCOSE, BUN, CREATININE, CALCIUM, MG, PHOS in the last 168 hours. GFR: Estimated Creatinine Clearance: 82.1 mL/min (by C-G formula based on SCr of 1.15 mg/dL). Liver Function Tests: No results for input(s): AST, ALT, ALKPHOS, BILITOT, PROT, ALBUMIN in the last 168 hours. No results for input(s): LIPASE, AMYLASE in the last 168 hours. No results for input(s): AMMONIA in the last 168 hours. Coagulation Profile: No results for input(s): INR, PROTIME in the last 168 hours. Cardiac Enzymes: No results for input(s): CKTOTAL, CKMB, CKMBINDEX, TROPONINI in the last 168 hours. BNP (last 3 results) No results for input(s): PROBNP in the last 8760 hours. HbA1C: No results for input(s): HGBA1C in the last 72 hours. CBG: Recent Labs  Lab 10/15/19 0755 10/15/19 1216 10/15/19 1650  GLUCAP 106* 96 97   Lipid Profile: No results for input(s): CHOL, HDL, LDLCALC, TRIG, CHOLHDL, LDLDIRECT in  the last 72 hours. Thyroid Function Tests: No results for input(s): TSH, T4TOTAL, FREET4, T3FREE, THYROIDAB in the last 72 hours. Anemia Panel: No results for input(s): VITAMINB12, FOLATE, FERRITIN, TIBC, IRON, RETICCTPCT in the last 72 hours. Urine analysis:    Component Value Date/Time   COLORURINE AMBER (A) 07/23/2019 2214   APPEARANCEUR CLOUDY (A) 07/23/2019 2214   LABSPEC 1.012 07/23/2019 2214   PHURINE 5.0 07/23/2019 2214   GLUCOSEU 50 (A) 07/23/2019 2214   HGBUR LARGE (A) 07/23/2019 2214   BILIRUBINUR NEGATIVE 07/23/2019 2214   KETONESUR NEGATIVE 07/23/2019 2214   PROTEINUR 30 (A) 07/23/2019 2214   NITRITE NEGATIVE 07/23/2019 2214   LEUKOCYTESUR SMALL (A) 07/23/2019 2214   Sepsis Labs: @LABRCNTIP (procalcitonin:4,lacticidven:4)  )No results found for this or any previous visit (from the past 240 hour(s)).    Radiology Studies: No results found.   Scheduled Meds:   stroke: mapping our early stages of recovery book   Does not apply Once   aspirin  325 mg Oral Daily   atorvastatin  20 mg Oral q1800   diclofenac sodium  2 g Topical TID AC & HS   enoxaparin (LOVENOX) injection  40 mg Subcutaneous Q24H   feeding supplement (ENSURE ENLIVE)  237 mL Oral BID BM   folic acid  1 mg Oral Daily   lactulose  20 g Oral BID   metoprolol tartrate  25 mg Oral BID   multivitamin with minerals  1 tablet Oral Daily   omega-3 acid ethyl esters  1 g Oral Daily  pantoprazole  40 mg Oral Daily   sucralfate  1 g Oral TID WC & HS   thiamine  100 mg Oral Daily   Or   thiamine  100 mg Intravenous Daily   Continuous Infusions:   LOS: 86 days   Time Spent in minutes   30 minutes  Cinthya Bors D.O. on 10/17/2019 at 12:29 PM  Between 7am to 7pm - Please see pager noted on [email or web address]  After 7pm go to [email or web address]  And look for the night coverage person covering for me after hours  [facility] Office  [phone]

## 2019-10-17 NOTE — Progress Notes (Signed)
Occupational Therapy Treatment Patient Details Name: Tyler [name] MRN: [id number] DOB: [birth year] Today's Date: 10/17/2019    History of present illness 48 y/o male transferred from [place] for unresponsiveness, likely secondary to substance abuse. Pt with rhabdomyolysis and AKI. EEG revealed moderate diffuse encephalopathy. PMH includes polysubstance abuse and HTN. Imaging show acute bialteral cerebellar infarcts.    OT comments  Pt is making steady progress with OT at this time, but continues to need supervision with mod instructional cueing to sequence through selfcare tasks.  He is not oriented to place, time, or situation.  He is currently supervision for functional transfers to and from the toilet, bed, and chair.  He will need 24 hour supervision for safety.  Recommend SNF for follow-up rehab.    Follow Up Recommendations  SNF;Supervision/Assistance - 24 hour    Equipment Recommendations  None recommended by OT       Precautions / Restrictions Precautions Precautions: Fall Precaution Comments: confusion Restrictions Weight Bearing Restrictions: No       Mobility Bed Mobility Overal bed mobility: Modified Independent Bed Mobility: Supine to Sit              Transfers Overall transfer level: Needs assistance Equipment used: None Transfers: Sit to/from Stand Sit to Stand: Supervision Stand pivot transfers: Supervision            Balance Overall balance assessment: Needs assistance Sitting-balance support: Feet supported Sitting balance-Leahy Scale: Normal     Standing balance support: During functional activity;No upper extremity supported Standing balance-Leahy Scale: Good                             ADL either performed or assessed with clinical judgement   ADL Overall ADL's : Needs assistance/impaired     Grooming: Wash/dry hands;Wash/dry face;Oral care;Brushing hair;Standing;Supervision/safety   Upper Body Bathing:  Supervision/ safety;Sitting   Lower Body Bathing: Supervison/ safety;Sit to/from stand   Upper Body Dressing : Minimal assistance(hospital gown)   Lower Body Dressing: Supervision/safety;Sit to/from stand   Toilet Transfer: Min guard;Regular Toilet(standing to urinate)   Toileting- Water quality scientist and Hygiene: Supervision/safety(standing to manage gown)         General ADL Comments: Pt needed mod instructional cueing for sequencing bathing as he only washed his hands at the sink without soap initially.  He needed mod instructional cueing to complete washing all other parts of his body.  Still with increased confusion and decreased memory     Vision Baseline Vision/History: No visual deficits Patient Visual Report: Blurring of vision Additional Comments: Pt with increased burry vision both near and far, but unable to determine completely secondary to decreased ability to consistently state words in large print.  When asked to read something, he would state the same thing that the therapist had read to him from another object or document.  Attempted 1.5 level reading glassses without any improvement per pt report.          Cognition Arousal/Alertness: Awake/alert Behavior During Therapy: WFL for tasks assessed/performed Overall Cognitive Status: Impaired/Different from baseline Area of Impairment: Orientation;Memory                 Orientation Level: Disoriented to;Place;Time;Situation   Memory: Decreased short-term memory Following Commands: Follows one step commands with increased time Safety/Judgement: Decreased awareness of safety;Decreased awareness of deficits Awareness: Intellectual   General Comments: Pt stated he was at [facility] when asked initially and stated that it was January 1st.  He was not aware of reason for being in the hospital.                   Pertinent Vitals/ Pain       Pain Assessment: Faces Faces Pain Scale: Hurts a little bit Pain  Location: right flank/abdominal pain Pain Descriptors / Indicators: Discomfort Pain Intervention(s): Monitored during session;Repositioned         Frequency  Min 1X/week        Progress Toward Goals  OT Goals(current goals can now be found in the care plan section)  Progress towards OT goals: Progressing toward goals     Plan Discharge plan remains appropriate       AM-PAC OT "6 Clicks" Daily Activity     Outcome Measure   Help from another person eating meals?: None Help from another person taking care of personal grooming?: A Little Help from another person toileting, which includes using toliet, bedpan, or urinal?: A Little Help from another person bathing (including washing, rinsing, drying)?: A Little Help from another person to put on and taking off regular upper body clothing?: A Little Help from another person to put on and taking off regular lower body clothing?: A Little 6 Click Score: 19    End of Session    OT Visit Diagnosis: Muscle weakness (generalized) (M62.81);Unsteadiness on feet (R26.81)   Activity Tolerance Patient tolerated treatment well   Patient Left with bed alarm set;with call bell/phone within reach;in chair   Nurse Communication Mobility status        Time: 4801-6553 OT Time Calculation (min): 32 min  Charges: OT General Charges $OT Visit: 1 Visit OT Treatments $Self Care/Home Management : 23-37 mins(32 mins)    Ennio Houp OTR/L 10/17/2019, 10:55 AM

## 2019-10-18 DIAGNOSIS — N170 Acute kidney failure with tubular necrosis: Secondary | ICD-10-CM | POA: Diagnosis not present

## 2019-10-18 DIAGNOSIS — I10 Essential (primary) hypertension: Secondary | ICD-10-CM | POA: Diagnosis not present

## 2019-10-18 DIAGNOSIS — G9341 Metabolic encephalopathy: Secondary | ICD-10-CM | POA: Diagnosis not present

## 2019-10-18 DIAGNOSIS — I6340 Cerebral infarction due to embolism of unspecified cerebral artery: Secondary | ICD-10-CM | POA: Diagnosis not present

## 2019-10-18 NOTE — Progress Notes (Signed)
Spoke with pt's brother, Herbie Baltimore, via telephone. Herbie Baltimore states he has arranged family care for pt and wants to pick him up in the morning. He was told to await details from case manager regarding this possibility, and that he would be contacted tomorrow.  Herbie Baltimore can be contacted at [phone]

## 2019-10-18 NOTE — Progress Notes (Signed)
Nutrition Follow-up  RD working remotely.   DOCUMENTATION CODES:   Severe malnutrition in context of acute illness/injury  INTERVENTION:  - continue Ensure Enlive BID. - continue to encourage PO intakes.    NUTRITION DIAGNOSIS:   Severe Malnutrition related to acute illness(acute toxic/metabolic encephalopathy) as evidenced by moderate fat depletion, severe fat depletion, moderate muscle depletion, severe muscle depletion, energy intake < or equal to 50% for > or equal to 5 days, percent weight loss. -ongoing, improving with excellent PO intakes of meals and supplements  GOAL:   Patient will meet greater than or equal to 90% of their needs -met  MONITOR:   PO intake, Supplement acceptance, Labs, Weight trends, Skin, I & O's  ASSESSMENT:   Tyler [name] is a 49 y.o. male with medical history significant of polysubstance abuse including heroin.  Brought in to [facility] ED just before [place] after being found down by friend last evening with nearby syringe.  Weight had been stable for >1 month and then began fluctuating on 10/30; continue to monitor. Patient continues to consume mainly 100% of meals and has been accepting Ensure >90% of the time offered. Patient is noted to be a/o to self and place.   Per notes: - acute bilateral ischemic CVA - acute metabolic encephalopathy--ongoing since admission; multifactoral (CVA, alcohol abuse, drug use - severe rhabdomyolysis--resolved - AKI--resolved - transaminitis-- resolved - alcohol abuse - UTI--completed abx - severe malnutrition - abdominal pain/bnack pain--unchanged T11 compression fx, L1 fx - deconditioning - PT and OT recommending SNF    Labs reviewed; no BMP since 11/1. Medications reviewed; 1 mg folvite/day, daily multivitamin with minerals, 1 g lovaza/day, 40 mg oral protonix/day, 1 g carafate TID, 100 mg thiamine/day.     Diet Order:   Diet Order            DIET DYS 3 Room service appropriate? No; Fluid consistency: Thin   Diet effective now              EDUCATION NEEDS:   Not appropriate for education at this time  Skin:  Skin Assessment: Reviewed RN Assessment  Last BM:  11/11  Height:   Ht Readings from Last 1 Encounters:  08/09/19 _0  (1.727 m)    Weight:   Wt Readings from Last 1 Encounters:  10/18/19 80.3 kg    Ideal Body Weight:  70 kg  BMI:  Body mass index is 26.91 kg/m.  Estimated Nutritional Needs:   Kcal:  2000-2200  Protein:  105-120 grams  Fluid:  > 2 L     Jarome Matin, MS, RD, LDN, [facility] Clinical Dietitian Pager # [phone] After hours/weekend pager # [phone]

## 2019-10-18 NOTE — Progress Notes (Signed)
NCM reached out to [facility] [place] ( [place] ) for update in reviewing pt for potential SNF bed offer. NCM spoke with Martinique Boone  @ [phone]. Martinique stated a Helene Kelp is the liaison covering [employer] territory. Stated she will rely message to Helene Kelp and a f/u call should be received today with an update. TOC team will continue to monitor. Whitman Hero RN,BSN,CM

## 2019-10-18 NOTE — Progress Notes (Signed)
PROGRESS NOTE    Tyler [name]  ZOX:[id number] DOB: 05/13/1971 DOA: 07/23/2019 PCP: Patient, No Pcp Per   Brief Narrative:  HPI on 07/23/2019 by Dr. Lyda PeroneJared Gardner Tyler [name] is a 48 y.o. male with medical history significant of polysubstance abuse including heroin.  Br[facility] ED just before [place] after being found down by friend last evening with nearby syringe.    Interim history He was given Narcan x2 with minimal improvement. On presentation he was found to have severe rhabdomyolysis CPK 72,000, elevated liver enzymes AST 2000 ALT 1000 with acute kidney injury BUN 40 creatinine 2.4 and hyperkalemia K 6.2. He was a started on IV fluids, bicarb drip and transferred to [facility] for further care. Initial leukocytosis of 21K, thought to be related to hemoconcentration. UDS negative. Salicylate and acetaminophen negative. CT head and neck were negative for acute traumatic finding. Patient admitted to hospitalist service for rhabdomyolysis, AKI. Rhabdomyolysis has improved, he continued to have persistent encephalopathy and was found to have acute bilateral cerebellar infarct. Neurology was consulted and has subsequently signed off. PT is recommending SNF.Prolonged hospitalization due to placement issues. Assessment & Plan   Acute bilateral ischemic CVA -MRI brain showed acute CVA -MRA unremarkable -Carotid ultrasound showed bilateral 1 to 39% ICA stent -Hemoglobin A1c 5.1, LDL 132 -Echocardiogram EF 60-65%, LV diastolic Doppler parameters consistent with impaired relaxation.  No evidence of LV regional abnormalities -Lower extremity Dopplers negative for DVT -Neurology consulted and appreciated, recommended 30-day cardiac event monitor -Continue statin, aspirin  Acute metabolic encephalopathy -Present on admission -Multifactorial including CVA, alcohol and drug abuse -UDS was negative, Tylenol and salicylate levels negative -TSH and B12 levels normal -EEG showed  moderate diffuse encephalopathy which is nonspecific to etiology but no seizure activity -Waxing and waning mentation, currently stable  Severe rhabdomyolysis, AKI -Resolved with IV fluids -Renal ultrasound unremarkable  Transaminitis with elevated bilirubin -Likely related to alcohol and rhabdomyolysis -RUQ ultrasound showed hepatic steatosis with early cirrhosis, some gallbladder-8 -Resolved  Alcohol abuse -Continue thiamine, multivitamin, folic acid -Currently no withdrawal symptoms  UTI -Completed 5 days of Rocephin  Essential hypertension -Continue metoprolol  Severe protein calorie malnutrition -Continue nutritional support  Abdominal pain/back pain -X-ray showed unchanged T11 compression fracture, age-indeterminate L1 fracture -ordered Voltaren gel   Physical deconditioning -Likely secondary to alcoholism, CVA, prolonged hospitalization -PT, OT recommending SNF -Patient continues to need several cues when doing self-care tasks as with walking.  Does not appear to be safe to be left alone.  DVT Prophylaxis  lovenox  Code Status: Full  Family Communication: None at bedside  Disposition Plan: Admitted. Pending placement  Consultants Neurology   Procedures  Echocardiogram Renal US  Antibiotics   Anti-infectives (From admission, onward)   Start     Dose/Rate Route Frequency Ordered Stop   07/25/19 0745  cefTRIAXone (ROCEPHIN) 1 g in sodium chloride 0.9 % 100 mL IVPB     1 g 200 mL/hr over 30 Minutes Intravenous Every 24 hours 07/25/19 0734 07/29/19 1518      Subjective:   Tyler [name] seen and examined today.  Patient has no complaints today. Denies chest pain, shortness of breath, abdominal pain, N/V/D/C.   Objective:   Vitals:   10/17/19 2131 10/17/19 2236 10/18/19 0609 10/18/19 0700  BP: (!) 122/95 (!) 140/107 128/87   Pulse: 79 69 69   Resp: 16  14   Temp: 97.8 F (36.6 C)  (!) 97.5 F (36.4 C)   TempSrc: Oral  Oral  SpO2: 100%  100%    Weight:    80.3 kg  Height:        Intake/Output Summary (Last 24 hours) at 10/18/2019 0913 Last data filed at 10/18/2019 0205 Gross per 24 hour  Intake 240 ml  Output 700 ml  Net -460 ml   Filed Weights   10/14/19 0449 10/17/19 0655 10/18/19 0700  Weight: 66.4 kg 86.2 kg 80.3 kg   Exam  General: Well developed, well nourished, NAD  HEENT: NCAT, mucous membranes moist.   Cardiovascular: S1 S2 auscultated, no murmur, RRR  Respiratory: Clear to auscultation bilaterally  Abdomen: Soft, nontender, nondistended, + bowel sounds  Extremities: warm dry without cyanosis clubbing or edema  Neuro: AAOx2 (self, place), nonfocal  Psych: Appropriate  Data Reviewed: I have personally reviewed following labs and imaging studies  CBC: No results for input(s): WBC, NEUTROABS, HGB, HCT, MCV, PLT in the last 168 hours. Basic Metabolic Panel: No results for input(s): NA, K, CL, CO2, GLUCOSE, BUN, CREATININE, CALCIUM, MG, PHOS in the last 168 hours. GFR: Estimated Creatinine Clearance: 74.3 mL/min (by C-G formula based on SCr of 1.15 mg/dL). Liver Function Tests: No results for input(s): AST, ALT, ALKPHOS, BILITOT, PROT, ALBUMIN in the last 168 hours. No results for input(s): LIPASE, AMYLASE in the last 168 hours. No results for input(s): AMMONIA in the last 168 hours. Coagulation Profile: No results for input(s): INR, PROTIME in the last 168 hours. Cardiac Enzymes: No results for input(s): CKTOTAL, CKMB, CKMBINDEX, TROPONINI in the last 168 hours. BNP (last 3 results) No results for input(s): PROBNP in the last 8760 hours. HbA1C: No results for input(s): HGBA1C in the last 72 hours. CBG: Recent Labs  Lab 10/15/19 0755 10/15/19 1216 10/15/19 1650  GLUCAP 106* 96 97   Lipid Profile: No results for input(s): CHOL, HDL, LDLCALC, TRIG, CHOLHDL, LDLDIRECT in the last 72 hours. Thyroid Function Tests: No results for input(s): TSH, T4TOTAL, FREET4, T3FREE, THYROIDAB in the last  72 hours. Anemia Panel: No results for input(s): VITAMINB12, FOLATE, FERRITIN, TIBC, IRON, RETICCTPCT in the last 72 hours. Urine analysis:    Component Value Date/Time   COLORURINE AMBER (A) 07/23/2019 2214   APPEARANCEUR CLOUDY (A) 07/23/2019 2214   LABSPEC 1.012 07/23/2019 2214   PHURINE 5.0 07/23/2019 2214   GLUCOSEU 50 (A) 07/23/2019 2214   HGBUR LARGE (A) 07/23/2019 2214   BILIRUBINUR NEGATIVE 07/23/2019 2214   [place]zan 07/23/2019 2214   PROTEINUR 30 (A) 07/23/2019 2214   NITRITE NEGATIVE 07/23/2019 2214   LEUKOCYTESUR SMALL (A) 07/23/2019 2214   Sepsis Labs: @LABRCNTIP (procalcitonin:4,lacticidven:4)  )No results found for this or any previous visit (from the past 240 hour(s)).    Radiology Studies: No results found.   Scheduled Meds: .  stroke: mapping our early stages of recovery book   Does not apply Once  . aspirin  325 mg Oral Daily  . atorvastatin  20 mg Oral q1800  . diclofenac sodium  2 g Topical TID AC & HS  . enoxaparin (LOVENOX) injection  40 mg Subcutaneous Q24H  . feeding supplement (ENSURE ENLIVE)  237 mL Oral BID BM  . folic acid  1 mg Oral Daily  . lactulose  20 g Oral BID  . metoprolol tartrate  25 mg Oral BID  . multivitamin with minerals  1 tablet Oral Daily  . omega-3 acid ethyl esters  1 g Oral Daily  . pantoprazole  40 mg Oral Daily  . sucralfate  1 g Oral TID WC &  HS  . thiamine  100 mg Oral Daily   Or  . thiamine  100 mg Intravenous Daily   Continuous Infusions:   LOS: 87 days   Time Spent in minutes   30 minutes  Khala Tarte D.O. on 10/18/2019 at 9:13 AM  Between 7am to 7pm - Please see pager noted on [email or web address]  After 7pm go to [email or web address]  And look for the night coverage person covering for me after hours  [facility] Office  [phone]

## 2019-10-18 NOTE — Progress Notes (Signed)
Physical Therapy Treatment Patient Details Name: Tyler [name] MRN: [id number] DOB: [birth year] Today's Date: 10/18/2019    History of Present Illness 48 y/o male transferred from [facility] for unresponsiveness, likely secondary to substance abuse. Pt with rhabdomyolysis and AKI. EEG revealed moderate diffuse encephalopathy. PMH includes polysubstance abuse and HTN. Imaging show acute bialteral cerebellar infarcts.     PT Comments    Pt performed gt training with emphasis on cognitive challenges.  He was unable to locate his room after walking two separate trials.  He was more receptive to finding the placards on the wall but could not recall what he was looking for.  Pt continues to require 24 hr supervision due to poor cognitive insight.      Follow Up Recommendations  SNF;Supervision/Assistance - 24 hour     Equipment Recommendations  None recommended by PT    Recommendations for Other Services       Precautions / Restrictions Precautions Precautions: Fall Precaution Comments: confusion Restrictions Weight Bearing Restrictions: No    Mobility  Bed Mobility Overal bed mobility: Modified Independent Bed Mobility: Supine to Sit     Supine to sit: Modified independent (Device/Increase time)     General bed mobility comments: Mod I to move to edge of bed.  Transfers Overall transfer level: Needs assistance Equipment used: None Transfers: Sit to/from Stand Sit to Stand: Supervision         General transfer comment: pt steady but impulsive, supervision for safety  Ambulation/Gait Ambulation/Gait assistance: Supervision Gait Distance (Feet): 600 Feet Assistive device: None Gait Pattern/deviations: Step-through pattern Gait velocity: WFL   General Gait Details: Pt required cues for obstacle negotiation and pathway finding.  Pt required cues for reading placards and signs to direct to room.  Multiple cues to given for room finding and he remains unable to  find his room.   Stairs   Stairs assistance: Supervision Stair Management: Alternating pattern;Forwards;One rail Left Number of Stairs: 12 General stair comments: Pt remains to follow commands for use of railing.   Wheelchair Mobility    Modified Rankin (Stroke Patients Only)       Balance Overall balance assessment: Needs assistance Sitting-balance support: Feet supported Sitting balance-Leahy Scale: Normal   Postural control: Left lateral lean   Standing balance-Leahy Scale: Good                              Cognition Arousal/Alertness: Awake/alert Behavior During Therapy: WFL for tasks assessed/performed Overall Cognitive Status: Impaired/Different from baseline Area of Impairment: Orientation;Memory                 Orientation Level: Disoriented to;Place;Time;Situation Current Attention Level: Sustained Memory: Decreased short-term memory Following Commands: Follows one step commands with increased time Safety/Judgement: Decreased awareness of safety;Decreased awareness of deficits Awareness: Intellectual Problem Solving: Requires verbal cues;Requires tactile cues;Slow processing General Comments: Stated he was in [place].  Pt performed search for room after walking around the unit. Performed x3 separate attempts and he was unable to locate his room.  He was more receptive to read placards in halls and he could sequence how to wash his hands.      Exercises      General Comments        Pertinent Vitals/Pain Pain Assessment: Faces Faces Pain Scale: No hurt    Home Living  Prior Function            PT Goals (current goals can now be found in the care plan section) Acute Rehab PT Goals Patient Stated Goal: none stated PT Goal Formulation: Patient unable to participate in goal setting Potential to Achieve Goals: Fair Progress towards PT goals: Progressing toward goals    Frequency    Min  2X/week      PT Plan Current plan remains appropriate    Co-evaluation              AM-PAC PT "6 Clicks" Mobility   Outcome Measure  Help needed turning from your back to your side while in a flat bed without using bedrails?: None Help needed moving from lying on your back to sitting on the side of a flat bed without using bedrails?: None Help needed moving to and from a bed to a chair (including a wheelchair)?: None Help needed standing up from a chair using your arms (e.g., wheelchair or bedside chair)?: None Help needed to walk in hospital room?: None Help needed climbing 3-5 steps with a railing? : None 6 Click Score: 24    End of Session Equipment Utilized During Treatment: Gait belt Activity Tolerance: Patient tolerated treatment well Patient left: with call bell/phone within reach;in chair;with chair alarm set Nurse Communication: Mobility status PT Visit Diagnosis: Unsteadiness on feet (R26.81);Difficulty in walking, not elsewhere classified (R26.2);Other symptoms and signs involving the nervous system (R29.898)     Time: 1610-9604 PT Time Calculation (min) (ACUTE ONLY): 18 min  Charges:  $Gait Training: 8-22 mins                     Erasmo Leventhal , PTA Acute Rehabilitation Services Pager [phone] Office [phone]     Jj Enyeart Eli Hose 10/18/2019, 3:39 PM

## 2019-10-19 DIAGNOSIS — I6340 Cerebral infarction due to embolism of unspecified cerebral artery: Secondary | ICD-10-CM | POA: Diagnosis not present

## 2019-10-19 DIAGNOSIS — N170 Acute kidney failure with tubular necrosis: Secondary | ICD-10-CM | POA: Diagnosis not present

## 2019-10-19 DIAGNOSIS — I10 Essential (primary) hypertension: Secondary | ICD-10-CM | POA: Diagnosis not present

## 2019-10-19 DIAGNOSIS — G9341 Metabolic encephalopathy: Secondary | ICD-10-CM | POA: Diagnosis not present

## 2019-10-19 LAB — BASIC METABOLIC PANEL
Anion gap: 11 (ref 5–15)
BUN: 25 mg/dL — ABNORMAL HIGH (ref 6–20)
CO2: 24 mmol/L (ref 22–32)
Calcium: 9.4 mg/dL (ref 8.9–10.3)
Chloride: 105 mmol/L (ref 98–111)
Creatinine, Ser: 1.13 mg/dL (ref 0.61–1.24)
GFR calc Af Amer: >60 mL/min (ref >60)
GFR calc non Af Amer: >60 mL/min (ref >60)
Glucose, Bld: 100 mg/dL — ABNORMAL HIGH (ref 70–99)
Potassium: 3.8 mmol/L (ref 3.5–5.1)
Sodium: 140 mmol/L (ref 135–145)

## 2019-10-19 NOTE — Progress Notes (Signed)
PROGRESS NOTE    Tyler [name]  PJR:[id number] DOB: July 01, 1971 DOA: 07/23/2019 PCP: Patient, No Pcp Per   Brief Narrative:  HPI on 07/23/2019 by Dr. Lyda Perone Thurman Tyler [name] is a 48 y.o. male with medical history significant of polysubstance abuse including heroin.  Brought in to [facility] ED just before [place] after being found down by friend last evening with nearby syringe.    Interim history He was given Narcan x2 with minimal improvement. On presentation he was found to have severe rhabdomyolysis CPK 72,000, elevated liver enzymes AST 2000 ALT 1000 with acute kidney injury BUN 40 creatinine 2.4 and hyperkalemia K 6.2. He was a started on IV fluids, bicarb drip and transferred to [facility] for further care. Initial leukocytosis of 21K, thought to be related to hemoconcentration. UDS negative. Salicylate and acetaminophen negative. CT head and neck were negative for acute traumatic finding. Patient admitted to hospitalist service for rhabdomyolysis, AKI. Rhabdomyolysis has improved, he continued to have persistent encephalopathy and was found to have acute bilateral cerebellar infarct. Neurology was consulted and has subsequently signed off. PT is recommending SNF.Prolonged hospitalization due to placement issues. Assessment & Plan   Acute bilateral ischemic CVA -MRI brain showed acute CVA -MRA unremarkable -Carotid ultrasound showed bilateral 1 to 39% ICA stenosis  -Hemoglobin A1c 5.1, LDL 132 -Echocardiogram EF 60-65%, LV diastolic Doppler parameters consistent with impaired relaxation.  No evidence of LV regional abnormalities -Lower extremity Dopplers negative for DVT -Neurology consulted and appreciated, recommended 30-day cardiac event monitor -Continue statin, aspirin  Acute metabolic encephalopathy -Present on admission -Multifactorial including CVA, alcohol and drug abuse -UDS was negative, Tylenol and salicylate levels negative -TSH and B12 levels normal -EEG showed  moderate diffuse encephalopathy which is nonspecific to etiology but no seizure activity -Waxing and waning mentation, currently stable  Severe rhabdomyolysis, AKI -Resolved with IV fluids -Renal ultrasound unremarkable  Transaminitis with elevated bilirubin -Likely related to alcohol and rhabdomyolysis -RUQ ultrasound showed hepatic steatosis with early cirrhosis, some gallbladder-8 -Resolved  Alcohol abuse -Continue thiamine, multivitamin, folic acid -Currently no withdrawal symptoms  UTI -Completed 5 days of Rocephin  Essential hypertension -Continue metoprolol  Severe protein calorie malnutrition -Continue nutritional support  Abdominal pain/back pain -X-ray showed unchanged T11 compression fracture, age-indeterminate L1 fracture -ordered Voltaren gel   Physical deconditioning -Likely secondary to alcoholism, CVA, prolonged hospitalization -PT, OT recommending SNF -Patient continues to need several cues when doing self-care tasks as with walking.  Does not appear to be safe to be left alone. -It seems that patient's brother wanted to take patient home however cannot provide 24-hour supervision  DVT Prophylaxis  lovenox  Code Status: Full  Family Communication: None at bedside  Disposition Plan: Admitted. Pending placement  Consultants Neurology   Procedures  Echocardiogram Renal US  Antibiotics   Anti-infectives (From admission, onward)   Start     Dose/Rate Route Frequency Ordered Stop   07/25/19 0745  cefTRIAXone (ROCEPHIN) 1 g in sodium chloride 0.9 % 100 mL IVPB     1 g 200 mL/hr over 30 Minutes Intravenous Every 24 hours 07/25/19 0734 07/29/19 1518      Subjective:   [name] seen and examined today.  Patient denies chest pain, shortness breath, abdominal pain, nausea or vomiting, diarrhea constipation. Objective:   Vitals:   10/18/19 1601 10/18/19 2126 10/19/19 0638 10/19/19 0825  BP: 132/71 (!) 121/97 101/70 101/62  Pulse: 73 75 77  98  Resp: 17 16 16 18   Temp: 97.9 F (36.6 C)  98.1 F (36.7 C) 98.2 F (36.8 C)   TempSrc: Oral Oral Oral   SpO2: 98% 97% 96% 94%  Weight:   80.3 kg   Height:        Intake/Output Summary (Last 24 hours) at 10/19/2019 1053 Last data filed at 10/18/2019 1833 Gross per 24 hour  Intake 520 ml  Output 500 ml  Net 20 ml   Filed Weights   10/17/19 0655 10/18/19 0700 10/19/19 [id number]  Weight: 86.2 kg 80.3 kg 80.3 kg   Exam  General: Well developed, well nourished, NAD, appears stated age  HEENT: NCAT, mucous membranes moist.   Extremities: warm dry without cyanosis clubbing or edema  Neuro: AAOx2 (place, self) nonfocal  Psych: Appropriate mood and affect  Data Reviewed: I have personally reviewed following labs and imaging studies  CBC: No results for input(s): WBC, NEUTROABS, HGB, HCT, MCV, PLT in the last 168 hours. Basic Metabolic Panel: Recent Labs  Lab 10/19/19 0231  NA 140  K 3.8  CL 105  CO2 24  GLUCOSE 100*  BUN 25*  CREATININE 1.13  CALCIUM 9.4   GFR: Estimated Creatinine Clearance: 75.7 mL/min (by C-G formula based on SCr of 1.13 mg/dL). Liver Function Tests: No results for input(s): AST, ALT, ALKPHOS, BILITOT, PROT, ALBUMIN in the last 168 hours. No results for input(s): LIPASE, AMYLASE in the last 168 hours. No results for input(s): AMMONIA in the last 168 hours. Coagulation Profile: No results for input(s): INR, PROTIME in the last 168 hours. Cardiac Enzymes: No results for input(s): CKTOTAL, CKMB, CKMBINDEX, TROPONINI in the last 168 hours. BNP (last 3 results) No results for input(s): PROBNP in the last 8760 hours. HbA1C: No results for input(s): HGBA1C in the last 72 hours. CBG: Recent Labs  Lab 10/15/19 0755 10/15/19 1216 10/15/19 1650  GLUCAP 106* 96 97   Lipid Profile: No results for input(s): CHOL, HDL, LDLCALC, TRIG, CHOLHDL, LDLDIRECT in the last 72 hours. Thyroid Function Tests: No results for input(s): TSH, T4TOTAL, FREET4,  T3FREE, THYROIDAB in the last 72 hours. Anemia Panel: No results for input(s): VITAMINB12, FOLATE, FERRITIN, TIBC, IRON, RETICCTPCT in the last 72 hours. Urine analysis:    Component Value Date/Time   COLORURINE AMBER (A) 07/23/2019 2214   APPEARANCEUR CLOUDY (A) 07/23/2019 2214   LABSPEC 1.012 07/23/2019 2214   PHURINE 5.0 07/23/2019 2214   GLUCOSEU 50 (A) 07/23/2019 2214   HGBUR LARGE (A) 07/23/2019 2214   BILIRUBINUR NEGATIVE 07/23/2019 2214   KETONESUR NEGATIVE 07/23/2019 2214   PROTEINUR 30 (A) 07/23/2019 2214   NITRITE NEGATIVE 07/23/2019 2214   LEUKOCYTESUR SMALL (A) 07/23/2019 2214   Sepsis Labs: @LABRCNTIP (procalcitonin:4,lacticidven:4)  )No results found for this or any previous visit (from the past 240 hour(s)).    Radiology Studies: No results found.   Scheduled Meds: .  stroke: mapping our early stages of recovery book   Does not apply Once  . aspirin  325 mg Oral Daily  . atorvastatin  20 mg Oral q1800  . diclofenac sodium  2 g Topical TID AC & HS  . enoxaparin (LOVENOX) injection  40 mg Subcutaneous Q24H  . feeding supplement (ENSURE ENLIVE)  237 mL Oral BID BM  . folic acid  1 mg Oral Daily  . lactulose  20 g Oral BID  . metoprolol tartrate  25 mg Oral BID  . multivitamin with minerals  1 tablet Oral Daily  . omega-3 acid ethyl esters  1 g Oral Daily  . pantoprazole  40 mg  Oral Daily  . sucralfate  1 g Oral TID WC & HS  . thiamine  100 mg Oral Daily   Or  . thiamine  100 mg Intravenous Daily   Continuous Infusions:   LOS: 88 days   Time Spent in minutes   30 minutes  Mahala Rommel D.O. on 10/19/2019 at 10:53 AM  Between 7am to 7pm - Please see pager noted on [email or web address]  After 7pm go to [email or web address]  And look for the night coverage person covering for me after hours  [facility] Office  [phone]

## 2019-10-20 DIAGNOSIS — N170 Acute kidney failure with tubular necrosis: Secondary | ICD-10-CM | POA: Diagnosis not present

## 2019-10-20 DIAGNOSIS — G9341 Metabolic encephalopathy: Secondary | ICD-10-CM | POA: Diagnosis not present

## 2019-10-20 DIAGNOSIS — I6340 Cerebral infarction due to embolism of unspecified cerebral artery: Secondary | ICD-10-CM | POA: Diagnosis not present

## 2019-10-20 DIAGNOSIS — I10 Essential (primary) hypertension: Secondary | ICD-10-CM | POA: Diagnosis not present

## 2019-10-20 NOTE — Progress Notes (Signed)
PROGRESS NOTE    Tyler [name]  [id number]:[id number] DOB: February 08, 1971 DOA: 07/23/2019 PCP: Patient, No Pcp Per   Brief Narrative:  HPI on 07/23/2019 by Dr. Jennette Kettle Tyler [name] is a 48 y.o. male with medical history significant of polysubstance abuse including heroin.  Brought in to [facility] ED just before [place] after being found down by friend last evening with nearby syringe.    Interim history He was given Narcan x2 with minimal improvement. On presentation he was found to have severe rhabdomyolysis CPK 72,000, elevated liver enzymes AST 2000 ALT 1000 with acute kidney injury BUN 40 creatinine 2.4 and hyperkalemia K 6.2. He was a started on IV fluids, bicarb drip and transferred to [facility] for further care. Initial leukocytosis of 21K, thought to be related to hemoconcentration. UDS negative. Salicylate and acetaminophen negative. CT head and neck were negative for acute traumatic finding. Patient admitted to hospitalist service for rhabdomyolysis, AKI. Rhabdomyolysis has improved, he continued to have persistent encephalopathy and was found to have acute bilateral cerebellar infarct. Neurology was consulted and has subsequently signed off. PT is recommending SNF.Prolonged hospitalization due to placement issues. Assessment & Plan   Acute bilateral ischemic CVA -MRI brain showed acute CVA -MRA unremarkable -Carotid ultrasound showed bilateral 1 to 39% ICA stenosis  -Hemoglobin A1c 5.1, LDL 132 -Echocardiogram EF 36-64%, LV diastolic Doppler parameters consistent with impaired relaxation.  No evidence of LV regional abnormalities -Lower extremity Dopplers negative for DVT -Neurology consulted and appreciated, recommended 30-day cardiac event monitor -Continue statin, aspirin  Acute metabolic encephalopathy -Present on admission -Multifactorial including CVA, alcohol and drug abuse -UDS was negative, Tylenol and salicylate levels negative -TSH and B12 levels normal -EEG showed  moderate diffuse encephalopathy which is nonspecific to etiology but no seizure activity -Waxing and waning mentation, currently stable  Severe rhabdomyolysis, AKI -Resolved with IV fluids -Renal ultrasound unremarkable  Transaminitis with elevated bilirubin -Likely related to alcohol and rhabdomyolysis -RUQ ultrasound showed hepatic steatosis with early cirrhosis, some gallbladder-8 -Resolved  Alcohol abuse -Continue thiamine, multivitamin, folic acid -Currently no withdrawal symptoms  UTI -Completed 5 days of Rocephin  Essential hypertension -Continue metoprolol  Severe protein calorie malnutrition -Continue nutritional support  Abdominal pain/back pain -X-ray showed unchanged T11 compression fracture, age-indeterminate L1 fracture -Continue Voltaren gel as needed  Physical deconditioning -Likely secondary to alcoholism, CVA, prolonged hospitalization -PT, OT recommending SNF -Patient continues to need several cues when doing self-care tasks as with walking.  Does not appear to be safe to be left alone. -It seems that patient's brother wanted to take patient home however cannot provide 24-hour supervision  DVT Prophylaxis  lovenox  Code Status: Full  Family Communication: None at bedside  Disposition Plan: Admitted. Pending placement  Consultants Neurology   Procedures  Echocardiogram Renal US  Antibiotics   Anti-infectives (From admission, onward)   Start     Dose/Rate Route Frequency Ordered Stop   07/25/19 0745  cefTRIAXone (ROCEPHIN) 1 g in sodium chloride 0.9 % 100 mL IVPB     1 g 200 mL/hr over 30 Minutes Intravenous Every 24 hours 07/25/19 0734 07/29/19 1518      Subjective:   Tyler [name] Alert seen and examined today.  Patient resting comfortably this morning.  Has no complaints. Objective:   Vitals:   10/19/19 2125 10/20/19 0314 10/20/19 0534 10/20/19 0856  BP: 103/74  103/87 116/69  Pulse: 72  78 67  Resp: 16  16   Temp: 98.7 F (37.1 C)   98.3 F (36.8  C)   TempSrc: Oral  Oral   SpO2: 100%  99%   Weight:  87 kg    Height:        Intake/Output Summary (Last 24 hours) at 10/20/2019 0905 Last data filed at 10/20/2019 0900 Gross per 24 hour  Intake 480 ml  Output 150 ml  Net 330 ml   Filed Weights   10/18/19 0700 10/19/19 0638 10/20/19 0314  Weight: 80.3 kg 80.3 kg 87 kg   Exam  General: Well developed, well nourished, NAD  HEENT: NCAT, mucous membranes moist.   Neuro: Awake, alert, nonfocal  Psych: Appropriate mood and affect  Data Reviewed: I have personally reviewed following labs and imaging studies  CBC: No results for input(s): WBC, NEUTROABS, HGB, HCT, MCV, PLT in the last 168 hours. Basic Metabolic Panel: Recent Labs  Lab 10/19/19 0231  NA 140  K 3.8  CL 105  CO2 24  GLUCOSE 100*  BUN 25*  CREATININE 1.13  CALCIUM 9.4   GFR: Estimated Creatinine Clearance: 83.8 mL/min (by C-G formula based on SCr of 1.13 mg/dL). Liver Function Tests: No results for input(s): AST, ALT, ALKPHOS, BILITOT, PROT, ALBUMIN in the last 168 hours. No results for input(s): LIPASE, AMYLASE in the last 168 hours. No results for input(s): AMMONIA in the last 168 hours. Coagulation Profile: No results for input(s): INR, PROTIME in the last 168 hours. Cardiac Enzymes: No results for input(s): CKTOTAL, CKMB, CKMBINDEX, TROPONINI in the last 168 hours. BNP (last 3 results) No results for input(s): PROBNP in the last 8760 hours. HbA1C: No results for input(s): HGBA1C in the last 72 hours. CBG: Recent Labs  Lab 10/15/19 0755 10/15/19 1216 10/15/19 1650  GLUCAP 106* 96 97   Lipid Profile: No results for input(s): CHOL, HDL, LDLCALC, TRIG, CHOLHDL, LDLDIRECT in the last 72 hours. Thyroid Function Tests: No results for input(s): TSH, T4TOTAL, FREET4, T3FREE, THYROIDAB in the last 72 hours. Anemia Panel: No results for input(s): VITAMINB12, FOLATE, FERRITIN, TIBC, IRON, RETICCTPCT in the last 72 hours. Urine  analysis:    Component Value Date/Time   COLORURINE AMBER (A) 07/23/2019 2214   APPEARANCEUR CLOUDY (A) 07/23/2019 2214   LABSPEC 1.012 07/23/2019 2214   PHURINE 5.0 07/23/2019 2214   GLUCOSEU 50 (A) 07/23/2019 2214   HGBUR LARGE (A) 07/23/2019 2214   BILIRUBINUR NEGATIVE 07/23/2019 2214   KETONESUR NEGATIVE 07/23/2019 2214   PROTEINUR 30 (A) 07/23/2019 2214   NITRITE NEGATIVE 07/23/2019 2214   LEUKOCYTESUR SMALL (A) 07/23/2019 2214   Sepsis Labs: @LABRCNTIP (procalcitonin:4,lacticidven:4)  )No results found for this or any previous visit (from the past 240 hour(s)).    Radiology Studies: No results found.   Scheduled Meds: .  stroke: mapping our early stages of recovery book   Does not apply Once  . aspirin  325 mg Oral Daily  . atorvastatin  20 mg Oral q1800  . diclofenac sodium  2 g Topical TID AC & HS  . enoxaparin (LOVENOX) injection  40 mg Subcutaneous Q24H  . feeding supplement (ENSURE ENLIVE)  237 mL Oral BID BM  . folic acid  1 mg Oral Daily  . lactulose  20 g Oral BID  . metoprolol tartrate  25 mg Oral BID  . multivitamin with minerals  1 tablet Oral Daily  . omega-3 acid ethyl esters  1 g Oral Daily  . pantoprazole  40 mg Oral Daily  . sucralfate  1 g Oral TID WC & HS  . thiamine  100 mg Oral  Daily   Or  . thiamine  100 mg Intravenous Daily   Continuous Infusions:   LOS: 89 days   Time Spent in minutes   30 minutes  Kaetlyn Noa D.O. on 10/20/2019 at 9:05 AM  Between 7am to 7pm - Please see pager noted on [email or web address]  After 7pm go to [email or web address]  And look for the night coverage person covering for me after hours  [facility] Office  [phone]

## 2019-10-21 DIAGNOSIS — G9341 Metabolic encephalopathy: Secondary | ICD-10-CM | POA: Diagnosis not present

## 2019-10-21 DIAGNOSIS — I6340 Cerebral infarction due to embolism of unspecified cerebral artery: Secondary | ICD-10-CM | POA: Diagnosis not present

## 2019-10-21 DIAGNOSIS — N170 Acute kidney failure with tubular necrosis: Secondary | ICD-10-CM | POA: Diagnosis not present

## 2019-10-21 DIAGNOSIS — I10 Essential (primary) hypertension: Secondary | ICD-10-CM | POA: Diagnosis not present

## 2019-10-21 MED ORDER — TRAMADOL HCL 50 MG PO TABS
50.0000 mg | ORAL_TABLET | Freq: Four times a day (QID) | ORAL | Status: DC | PRN
  Administered 2019-10-21 – 2019-11-18 (×4): 50 mg via ORAL
  Filled 2019-10-21 (×7): qty 1

## 2019-10-21 NOTE — Progress Notes (Signed)
PROGRESS NOTE    Tyler [name]  ZOX:[id number] DOB: 1971-07-24 DOA: 07/23/2019 PCP: Patient, No Pcp Per   Brief Narrative:  HPI on 07/23/2019 by Dr. Jennette Kettle Tyler [name] is a 48 y.o. male with medical history significant of polysubstance abuse including heroin.  Brought in to [facility] ED just before [place] after being found down by friend last evening with nearby syringe.    Interim history He was given Narcan x2 with minimal improvement. On presentation he was found to have severe rhabdomyolysis CPK 72,000, elevated liver enzymes AST 2000 ALT 1000 with acute kidney injury BUN 40 creatinine 2.4 and hyperkalemia K 6.2. He was a started on IV fluids, bicarb drip and transferred to [facility] for further care. Initial leukocytosis of 21K, thought to be related to hemoconcentration. UDS negative. Salicylate and acetaminophen negative. CT head and neck were negative for acute traumatic finding. Patient admitted to hospitalist service for rhabdomyolysis, AKI. Rhabdomyolysis has improved, he continued to have persistent encephalopathy and was found to have acute bilateral cerebellar infarct. Neurology was consulted and has subsequently signed off. PT is recommending SNF.Prolonged hospitalization due to placement issues. Assessment & Plan   Acute bilateral ischemic CVA -MRI brain showed acute CVA -MRA unremarkable -Carotid ultrasound showed bilateral 1 to 39% ICA stenosis  -Hemoglobin A1c 5.1, LDL 132 -Echocardiogram EF 81-19%, LV diastolic Doppler parameters consistent with impaired relaxation.  No evidence of LV regional abnormalities -Lower extremity Dopplers negative for DVT -Neurology consulted and appreciated, recommended 30-day cardiac event monitor -Continue statin, aspirin  Acute metabolic encephalopathy -Present on admission -Multifactorial including CVA, alcohol and drug abuse -UDS was negative, Tylenol and salicylate levels negative -TSH and B12 levels normal -EEG showed  moderate diffuse encephalopathy which is nonspecific to etiology but no seizure activity -Waxing and waning mentation, currently stable  Severe rhabdomyolysis, AKI -Resolved with IV fluids -Renal ultrasound unremarkable  Transaminitis with elevated bilirubin -Likely related to alcohol and rhabdomyolysis -RUQ ultrasound showed hepatic steatosis with early cirrhosis, some gallbladder-8 -Resolved  Alcohol abuse -Continue thiamine, multivitamin, folic acid -Currently no withdrawal symptoms  UTI -Completed 5 days of Rocephin  Essential hypertension -Continue metoprolol  Severe protein calorie malnutrition -Continue nutritional support  Abdominal pain/back pain -X-ray showed unchanged T11 compression fracture, age-indeterminate L1 fracture -Continue Voltaren gel as needed  Physical deconditioning -Likely secondary to alcoholism, CVA, prolonged hospitalization -PT, OT recommending SNF -Patient continues to need several cues when doing self-care tasks as with walking.  Does not appear to be safe to be left alone. -patient unaware of simple items -It seems that patient's brother wanted to take patient home however cannot provide 24-hour supervision  DVT Prophylaxis  lovenox  Code Status: Full  Family Communication: None at bedside  Disposition Plan: Admitted.  Given degree of physical deconditioning as well as cognitive changes, patient is in need of placement  Consultants Neurology   Procedures  Echocardiogram Renal US  Antibiotics   Anti-infectives (From admission, onward)   Start     Dose/Rate Route Frequency Ordered Stop   07/25/19 0745  cefTRIAXone (ROCEPHIN) 1 g in sodium chloride 0.9 % 100 mL IVPB     1 g 200 mL/hr over 30 Minutes Intravenous Every 24 hours 07/25/19 0734 07/29/19 1518      Subjective:   Tyler [name] seen and examined today.  Patient resting comfortably this morning.  Has no complaints. Objective:   Vitals:   10/20/19 1251 10/20/19  2128 10/20/19 2213 10/21/19 0634  BP: (!) 113/91 (!) 137/113 122/88 Marland Kitchen)  98/59  Pulse: 89 84 69 67  Resp: 16     Temp: 98.2 F (36.8 C) 98.2 F (36.8 C)  97.8 F (36.6 C)  TempSrc: Oral Oral  Oral  SpO2: 100% 99%  98%  Weight:      Height:        Intake/Output Summary (Last 24 hours) at 10/21/2019 1006 Last data filed at 10/20/2019 1925 Gross per 24 hour  Intake --  Output 450 ml  Net -450 ml   Filed Weights   10/18/19 0700 10/19/19 6213 10/20/19 0314  Weight: 80.3 kg 80.3 kg 87 kg   Exam  General: Well developed, well nourished, NAD, appears stated age  HEENT: NCAT, mucous membranes moist.   Extremities: warm dry without cyanosis clubbing or edema  Neuro: Awake, [name], nonfocal  Psych: Appropriate mood and affect   Data Reviewed: I have personally reviewed following labs and imaging studies  CBC: No results for input(s): WBC, NEUTROABS, HGB, HCT, MCV, PLT in the last 168 hours. Basic Metabolic Panel: Recent Labs  Lab 10/19/19 0231  NA 140  K 3.8  CL 105  CO2 24  GLUCOSE 100*  BUN 25*  CREATININE 1.13  CALCIUM 9.4   GFR: Estimated Creatinine Clearance: 83.8 mL/min (by C-G formula based on SCr of 1.13 mg/dL). Liver Function Tests: No results for input(s): AST, ALT, ALKPHOS, BILITOT, PROT, ALBUMIN in the last 168 hours. No results for input(s): LIPASE, AMYLASE in the last 168 hours. No results for input(s): AMMONIA in the last 168 hours. Coagulation Profile: No results for input(s): INR, PROTIME in the last 168 hours. Cardiac Enzymes: No results for input(s): CKTOTAL, CKMB, CKMBINDEX, TROPONINI in the last 168 hours. BNP (last 3 results) No results for input(s): PROBNP in the last 8760 hours. HbA1C: No results for input(s): HGBA1C in the last 72 hours. CBG: Recent Labs  Lab 10/15/19 0755 10/15/19 1216 10/15/19 1650  GLUCAP 106* 96 97   Lipid Profile: No results for input(s): CHOL, HDL, LDLCALC, TRIG, CHOLHDL, LDLDIRECT in the last 72  hours. Thyroid Function Tests: No results for input(s): TSH, T4TOTAL, FREET4, T3FREE, THYROIDAB in the last 72 hours. Anemia Panel: No results for input(s): VITAMINB12, FOLATE, FERRITIN, TIBC, IRON, RETICCTPCT in the last 72 hours. Urine analysis:    Component Value Date/Time   COLORURINE AMBER (A) 07/23/2019 2214   APPEARANCEUR CLOUDY (A) 07/23/2019 2214   LABSPEC 1.012 07/23/2019 2214   PHURINE 5.0 07/23/2019 2214   GLUCOSEU 50 (A) 07/23/2019 2214   HGBUR LARGE (A) 07/23/2019 2214   BILIRUBINUR NEGATIVE 07/23/2019 2214   KETONESUR NEGATIVE 07/23/2019 2214   PROTEINUR 30 (A) 07/23/2019 2214   NITRITE NEGATIVE 07/23/2019 2214   LEUKOCYTESUR SMALL (A) 07/23/2019 2214   Sepsis Labs: @LABRCNTIP (procalcitonin:4,lacticidven:4)  )No results found for this or any previous visit (from the past 240 hour(s)).    Radiology Studies: No results found.   Scheduled Meds:   stroke: mapping our early stages of recovery book   Does not apply Once   aspirin  325 mg Oral Daily   atorvastatin  20 mg Oral q1800   diclofenac sodium  2 g Topical TID AC & HS   enoxaparin (LOVENOX) injection  40 mg Subcutaneous Q24H   feeding supplement (ENSURE ENLIVE)  237 mL Oral BID BM   folic acid  1 mg Oral Daily   lactulose  20 g Oral BID   metoprolol tartrate  25 mg Oral BID   multivitamin with minerals  1 tablet Oral Daily  omega-3 acid ethyl esters  1 g Oral Daily   pantoprazole  40 mg Oral Daily   sucralfate  1 g Oral TID WC & HS   thiamine  100 mg Oral Daily   Or   thiamine  100 mg Intravenous Daily   Continuous Infusions:   LOS: 90 days   Time Spent in minutes   30 minutes  Milianna Ericsson D.O. on 10/21/2019 at 10:06 AM  Between 7am to 7pm - Please see pager noted on [email or web address]  After 7pm go to [email or web address]  And look for the night coverage person covering for me after hours  [facility] Office  [phone]

## 2019-10-22 DIAGNOSIS — I6340 Cerebral infarction due to embolism of unspecified cerebral artery: Secondary | ICD-10-CM | POA: Diagnosis not present

## 2019-10-22 DIAGNOSIS — G9341 Metabolic encephalopathy: Secondary | ICD-10-CM | POA: Diagnosis not present

## 2019-10-22 DIAGNOSIS — I10 Essential (primary) hypertension: Secondary | ICD-10-CM | POA: Diagnosis not present

## 2019-10-22 DIAGNOSIS — N170 Acute kidney failure with tubular necrosis: Secondary | ICD-10-CM | POA: Diagnosis not present

## 2019-10-22 NOTE — Progress Notes (Signed)
PROGRESS NOTE    Tyler [name]  HER:[id number] DOB: [birth year] DOA: 07/23/2019 PCP: Patient, No Pcp Per   Brief Narrative:  HPI on 07/23/2019 by Dr. Lyda Perone Tyler [name] is a 48 y.o. male with medical history significant of polysubstance abuse including heroin.  Brought in to [facility] ED just before [place] after being found down by friend last evening with nearby syringe.    Interim history He was given Narcan x2 with minimal improvement. On presentation he was found to have severe rhabdomyolysis CPK 72,000, elevated liver enzymes AST 2000 ALT 1000 with acute kidney injury BUN 40 creatinine 2.4 and hyperkalemia K 6.2. He was a started on IV fluids, bicarb drip and transferred to [facility] for further care. Initial leukocytosis of 21K, thought to be related to hemoconcentration. UDS negative. Salicylate and acetaminophen negative. CT head and neck were negative for acute traumatic finding. Patient admitted to hospitalist service for rhabdomyolysis, AKI. Rhabdomyolysis has improved, he continued to have persistent encephalopathy and was found to have acute bilateral cerebellar infarct. Neurology was consulted and has subsequently signed off. PT is recommending SNF.Prolonged hospitalization due to placement issues. Assessment & Plan   Acute bilateral ischemic CVA -MRI brain showed acute CVA -MRA unremarkable -Carotid ultrasound showed bilateral 1 to 39% ICA stenosis  -Hemoglobin A1c 5.1, LDL 132 -Echocardiogram EF 60-65%, LV diastolic Doppler parameters consistent with impaired relaxation.  No evidence of LV regional abnormalities -Lower extremity Dopplers negative for DVT -Neurology consulted and appreciated, recommended 30-day cardiac event monitor -Continue statin, aspirin  Acute metabolic encephalopathy -Present on admission -Multifactorial including CVA, alcohol and drug abuse -UDS was negative, Tylenol and salicylate levels negative -TSH and B12 levels normal -EEG showed  moderate diffuse encephalopathy which is nonspecific to etiology but no seizure activity -Waxing and waning mentation, currently stable  Severe rhabdomyolysis, AKI -Resolved with IV fluids -Renal ultrasound unremarkable  Transaminitis with elevated bilirubin -Likely related to alcohol and rhabdomyolysis -RUQ ultrasound showed hepatic steatosis with early cirrhosis, some gallbladder-8 -Resolved  Alcohol abuse -Continue thiamine, multivitamin, folic acid -Currently no withdrawal symptoms  UTI -Completed 5 days of Rocephin  Essential hypertension -Continue metoprolol  Severe protein calorie malnutrition -Continue nutritional support  Abdominal pain/back pain -X-ray showed unchanged T11 compression fracture, age-indeterminate L1 fracture -Continue Voltaren gel as needed  Physical deconditioning -Likely secondary to alcoholism, CVA, prolonged hospitalization -PT, OT recommending SNF -Patient continues to need several cues when doing self-care tasks as with walking.  Does not appear to be safe to be left alone. -patient unaware of simple items -It seems that patient's brother wanted to take patient home however cannot provide 24-hour supervision -will consult speech again for possible cognitive eval  DVT Prophylaxis  lovenox  Code Status: Full  Family Communication: None at bedside  Disposition Plan: Admitted.  Given degree of physical deconditioning as well as cognitive changes, patient is in need of placement  Consultants Neurology   Procedures  Echocardiogram Renal US  Antibiotics   Anti-infectives (From admission, onward)   Start     Dose/Rate Route Frequency Ordered Stop   07/25/19 0745  cefTRIAXone (ROCEPHIN) 1 g in sodium chloride 0.9 % 100 mL IVPB     1 g 200 mL/hr over 30 Minutes Intravenous Every 24 hours 07/25/19 0734 07/29/19 1518      Subjective:   Tyler [name] seen and examined today.  Patient currently resting comfortably, no  complaints. Objective:   Vitals:   10/21/19 1006 10/21/19 1304 10/21/19 2149 10/22/19 0622  BP: 107/87  90/67 110/70 109/75  Pulse: 69 72 67 60  Resp:  18    Temp:  98 F (36.7 C) 97.9 F (36.6 C) 97.8 F (36.6 C)  TempSrc:   Oral Oral  SpO2:  97% 97% 97%  Weight:      Height:        Intake/Output Summary (Last 24 hours) at 10/22/2019 1050 Last data filed at 10/21/2019 1730 Gross per 24 hour  Intake 400 ml  Output --  Net 400 ml   Filed Weights   10/18/19 0700 10/19/19 0638 10/20/19 0314  Weight: 80.3 kg 80.3 kg 87 kg   Exam  General: Well developed, well nourished, NAD, appears stated age  HEENT: NCAT,  mucous membranes moist.   Neuro: Awake, alert, nonfocal  Psych: Pleasant, appropriate mood and affect   Data Reviewed: I have personally reviewed following labs and imaging studies  CBC: No results for input(s): WBC, NEUTROABS, HGB, HCT, MCV, PLT in the last 168 hours. Basic Metabolic Panel: Recent Labs  Lab 10/19/19 0231  NA 140  K 3.8  CL 105  CO2 24  GLUCOSE 100*  BUN 25*  CREATININE 1.13  CALCIUM 9.4   GFR: Estimated Creatinine Clearance: 83.8 mL/min (by C-G formula based on SCr of 1.13 mg/dL). Liver Function Tests: No results for input(s): AST, ALT, ALKPHOS, BILITOT, PROT, ALBUMIN in the last 168 hours. No results for input(s): LIPASE, AMYLASE in the last 168 hours. No results for input(s): AMMONIA in the last 168 hours. Coagulation Profile: No results for input(s): INR, PROTIME in the last 168 hours. Cardiac Enzymes: No results for input(s): CKTOTAL, CKMB, CKMBINDEX, TROPONINI in the last 168 hours. BNP (last 3 results) No results for input(s): PROBNP in the last 8760 hours. HbA1C: No results for input(s): HGBA1C in the last 72 hours. CBG: Recent Labs  Lab 10/15/19 1216 10/15/19 1650  GLUCAP 96 97   Lipid Profile: No results for input(s): CHOL, HDL, LDLCALC, TRIG, CHOLHDL, LDLDIRECT in the last 72 hours. Thyroid Function Tests: No  results for input(s): TSH, T4TOTAL, FREET4, T3FREE, THYROIDAB in the last 72 hours. Anemia Panel: No results for input(s): VITAMINB12, FOLATE, FERRITIN, TIBC, IRON, RETICCTPCT in the last 72 hours. Urine analysis:    Component Value Date/Time   COLORURINE AMBER (A) 07/23/2019 2214   APPEARANCEUR CLOUDY (A) 07/23/2019 2214   LABSPEC 1.012 07/23/2019 2214   PHURINE 5.0 07/23/2019 2214   GLUCOSEU 50 (A) 07/23/2019 2214   HGBUR LARGE (A) 07/23/2019 2214   BILIRUBINUR NEGATIVE 07/23/2019 2214   KETONESUR NEGATIVE 07/23/2019 2214   PROTEINUR 30 (A) 07/23/2019 2214   NITRITE NEGATIVE 07/23/2019 2214   LEUKOCYTESUR SMALL (A) 07/23/2019 2214   Sepsis Labs: @LABRCNTIP (procalcitonin:4,lacticidven:4)  )No results found for this or any previous visit (from the past 240 hour(s)).    Radiology Studies: No results found.   Scheduled Meds:   stroke: mapping our early stages of recovery book   Does not apply Once   aspirin  325 mg Oral Daily   atorvastatin  20 mg Oral q1800   diclofenac sodium  2 g Topical TID AC & HS   enoxaparin (LOVENOX) injection  40 mg Subcutaneous Q24H   feeding supplement (ENSURE ENLIVE)  237 mL Oral BID BM   folic acid  1 mg Oral Daily   lactulose  20 g Oral BID   metoprolol tartrate  25 mg Oral BID   multivitamin with minerals  1 tablet Oral Daily   omega-3 acid ethyl esters  1  g Oral Daily   pantoprazole  40 mg Oral Daily   sucralfate  1 g Oral TID WC & HS   thiamine  100 mg Oral Daily   Or   thiamine  100 mg Intravenous Daily   Continuous Infusions:   LOS: 91 days   Time Spent in minutes   30 minutes  Tyler [name] D.O. on 10/22/2019 at 10:50 AM  Between 7am to 7pm - Please see pager noted on [email or web address]  After 7pm go to [email or web address]  And look for the night coverage person covering for me after hours  [facility] Office  [phone]

## 2019-10-23 DIAGNOSIS — N170 Acute kidney failure with tubular necrosis: Secondary | ICD-10-CM | POA: Diagnosis not present

## 2019-10-23 DIAGNOSIS — I10 Essential (primary) hypertension: Secondary | ICD-10-CM | POA: Diagnosis not present

## 2019-10-23 DIAGNOSIS — G9341 Metabolic encephalopathy: Secondary | ICD-10-CM | POA: Diagnosis not present

## 2019-10-23 DIAGNOSIS — I6340 Cerebral infarction due to embolism of unspecified cerebral artery: Secondary | ICD-10-CM | POA: Diagnosis not present

## 2019-10-23 NOTE — Evaluation (Signed)
Speech Language Pathology Evaluation Patient Details Name: Tyler [name] MRN: [id number] DOB: March 01, 1971 Today's Date: 10/23/2019 Time: 1450-1530 SLP Time Calculation (min) (ACUTE ONLY): 40 min  Problem List:  Patient Active Problem List   Diagnosis Date Noted  . Protein-calorie malnutrition, severe 08/09/2019  . Cerebral embolism with cerebral infarction 07/28/2019  . Rhabdomyolysis 07/23/2019  . Acute kidney failure (HCC) 07/23/2019  . Acute metabolic encephalopathy 07/23/2019  . Polysubstance abuse (HCC) 07/23/2019  . Hyperkalemia 07/23/2019  . Transaminitis 07/23/2019  . HTN (hypertension) 07/23/2019  . Fall 04/21/2012  . Traumatic subdural hematoma (HCC) 04/21/2012  . ICC 04/21/2012  . Left orbit fracture (HCC) 04/21/2012  . Left maxillary fracture (HCC) 04/21/2012  . Alcohol use 04/21/2012  . Cocaine use 04/21/2012   Past Medical History:  Past Medical History:  Diagnosis Date  . Alcohol abuse   . Back pain   . Hypertension   . Kidney disease    Past Surgical History:  Past Surgical History:  Procedure Laterality Date  . MANDIBLE SURGERY    . NERVE, TENDON AND ARTERY REPAIR Left 10/07/2014   Procedure: ORIF PROXIMAL THUMB REPAIR OF EPL AND FLP TENDON MICROSCOPIC NERVE, TENDON AND RADIAL ARTERY REPAIR WITH WOUND EXPLORATION;  Surgeon: Dairl Ponder, MD;  Location: [facility] OR;  Service: Orthopedics;  Laterality: Left;  OEC, MICROSCOPE   HPI:  48yo male admitted 07/23/2019 afater being found down with syringe nearby. PMH: polysubstance abuse including heroin. MRI - patchy acute bilateral cerebral and cerebellar infarcts, right temporal lobe encephalomalacia and chronic cerebral white matter disease. 9th grade education, worked "odd jobs". No baseline deficits per daughter   Assessment / Plan / Recommendation Clinical Impression  MD requested re-evaluation of cognitive linguistic function. Pt was cooperative with participation in eval today. The Mini-Mental State  Examination (MMSE) was administered. Pt scored 11/30 on MMSE, indicating significant cognitive impairment, however, goals from previous SLP intervention focused on language deficits. Status on today's assessment may indicate an improvement in basic communication skills to allow completion of MMSE.   Orientation = pt aware only of season He reported being at [place] the [place]". Pt was able to demonstrate immediate recall of 3/3 words, but had no recollection or recognition of any of the 3 words with 3 minute delay. Attention: Pt was able to spell "WORLD' correctly, but perseverated on spelling the word "BACKWARDS" regardless of how the task was explained to him. Naming and repetition tasks were intact. Pt followed 2/3step command and demonstrated comprehension of phrase length written command. Sentence writing task and figure copying were significantly impaired. Pt was unable to complete a clock drawing task despite multi-modal cues. Pt is highly distractible with fleeting attention. 24 hour close supervision is recommended at discharge.    SLP Assessment  SLP Recommendation/Assessment: Patient needs continued Speech Language Pathology Services SLP Visit Diagnosis: Cognitive communication deficit (R41.841)    Follow Up Recommendations  Skilled Nursing facility;24 hour supervision/assistance    Frequency and Duration min 1 x/week  2 weeks      SLP Evaluation Cognition  Overall Cognitive Status: Impaired/Different from baseline Arousal/Alertness: Awake/alert Orientation Level: Oriented to person;Disoriented to place;Disoriented to time;Disoriented to situation Attention: Focused;Sustained Focused Attention: Impaired Focused Attention Impairment: Verbal basic Sustained Attention: Impaired Sustained Attention Impairment: Verbal basic Memory: Impaired Memory Impairment: Storage deficit;Decreased short term memory;Retrieval deficit;Decreased recall of  new information Decreased Short Term Memory: Verbal basic Awareness: Impaired Awareness Impairment: Intellectual impairment Problem Solving: Impaired Problem Solving Impairment: Verbal basic Safety/Judgment:  Impaired       Comprehension  Auditory Comprehension Overall Auditory Comprehension: Impaired Yes/No Questions: Within Functional Limits Commands: Impaired One Step Basic Commands: 75-100% accurate Two Step Basic Commands: 75-100% accurate Multistep Basic Commands: 25-49% accurate Conversation: Simple Interfering Components: Attention;Working memory;Processing speed EffectiveTechniques: Repetition Retail banker: Not tested Reading Comprehension Reading Status: Within funtional limits(phrase length comprehension)    Expression Expression Primary Mode of Expression: Verbal Verbal Expression Overall Verbal Expression: Appears within functional limits for tasks assessed Written Expression Dominant Hand: Right Written Expression: Exceptions to [facility] Copy Ability: (unable to copy figures on MMSE) Self Formulation Ability: Phrase;Word   Oral / Motor  Oral Motor/Sensory Function Overall Oral Motor/Sensory Function: Within functional limits Motor Speech Overall Motor Speech: Appears within functional limits for tasks assessed   [place], [place], CCC-SLP Speech Language Pathologist Office: [phone] Pager: [phone]  Shonna Chock 10/23/2019, 4:48 PM

## 2019-10-23 NOTE — Progress Notes (Signed)
Occupational Therapy Treatment Patient Details Name: Tyler [name] MRN: [id number] DOB: 1971-09-13 Today's Date: 10/23/2019    History of present illness 48 y/o male transferred from [place] for unresponsiveness, likely secondary to substance abuse. Pt with rhabdomyolysis and AKI. EEG revealed moderate diffuse encephalopathy. PMH includes polysubstance abuse and HTN. Imaging show acute bialteral cerebellar infarcts.    OT comments  Pt making steady progress towards OT goals this session. Pt complete full standing shower with supervision. Pt required 1 verbal cue to locate soap in shower, but overall able to appropriately sequence shower steps w/o cues this session. Pt complete UB/LB dressing with supervision from EOB. Pt able to gather clothes and put remainder of clothes in closet needing 1 verbal cues. DC plan remains appropriate, will continue to follow per POC.    Follow Up Recommendations  SNF;Supervision/Assistance - 24 hour    Equipment Recommendations  None recommended by OT    Recommendations for Other Services      Precautions / Restrictions Precautions Precautions: Fall Precaution Comments: confusion Restrictions Weight Bearing Restrictions: No       Mobility Bed Mobility Overal bed mobility: Modified Independent Bed Mobility: Supine to Sit;Sit to Supine     Supine to sit: Modified independent (Device/Increase time) Sit to supine: Modified independent (Device/Increase time)   General bed mobility comments: Mod I to move to edge of bed.  Transfers Overall transfer level: Needs assistance Equipment used: None Transfers: Sit to/from Stand Sit to Stand: Supervision         General transfer comment: supervision for safety    Balance Overall balance assessment: Needs assistance Sitting-balance support: Feet supported Sitting balance-Leahy Scale: Normal Sitting balance - Comments: able to sit unsupport with no LOB   Standing balance support:  During functional activity;No upper extremity supported Standing balance-Leahy Scale: Good Standing balance comment: able to shower standing this date                           ADL either performed or assessed with clinical judgement   ADL Overall ADL's : Needs assistance/impaired         Upper Body Bathing: Supervision/ safety;Standing   Lower Body Bathing: Supervison/ safety;Sit to/from stand Lower Body Bathing Details (indicate cue type and reason): standing level shower today Upper Body Dressing : Supervision/safety;Set up;Sitting   Lower Body Dressing: Supervision/safety;Sit to/from stand           Tub/ Shower Transfer: Walk-in shower;Supervision/safety;Ambulation Tub/Shower Transfer Details (indicate cue type and reason): good safety awareness in shower Functional mobility during ADLs: Supervision/safety;Cueing for safety;Cueing for sequencing General ADL Comments: pt required 1 verbal cue to locate soap in shower, able to sequence all showering steps with MIN cues for sequencing     Vision Baseline Vision/History: No visual deficits Patient Visual Report: Blurring of vision Vision Assessment?: Vision impaired- to be further tested in functional context Visual Fields: Impaired-to be further tested in functional context Additional Comments: pt reports blurring of vision. Able to locate letters in a word search when dividing word search into 1/4 sections; pt reports images are blurry but able to locate. When asked to read words printed on paper pt able to read 2/3, able to read "fishing" and "monday" but could not read "electricity" may be d/y cognitive deficit more so than visual acutiy issues, pt able to locate letters in all 4 visual quadrants while standing at [employer]  Cognition Arousal/Alertness: Awake/alert Behavior During Therapy: WFL for tasks assessed/performed Overall Cognitive Status: Impaired/Different from  baseline Area of Impairment: Orientation;Memory;Attention;Following commands;Problem solving                 Orientation Level: Disoriented to;Time;Situation Current Attention Level: Sustained Memory: Decreased short-term memory Following Commands: Follows one step commands with increased time     Problem Solving: Requires verbal cues;Slow processing;Difficulty sequencing General Comments: pt able to state he was at [facility] cone this session at start of session and again able to recall at end of session, still slow to process and requires increased time and verbal cues for sequencing. Overall, seemed to more attenttive to session.        Exercises     Shoulder Instructions       General Comments      Pertinent Vitals/ Pain       Pain Assessment: No/denies pain  Home Living                                          Prior Functioning/Environment              Frequency  Min 1X/week        Progress Toward Goals  OT Goals(current goals can now be found in the care plan section)  Progress towards OT goals: Progressing toward goals  Acute Rehab OT Goals Patient Stated Goal: none stated OT Goal Formulation: With patient Time For Goal Achievement: 10/30/19 Potential to Achieve Goals: Good  Plan Discharge plan remains appropriate    Co-evaluation                 AM-PAC OT "6 Clicks" Daily Activity     Outcome Measure   Help from another person eating meals?: None Help from another person taking care of personal grooming?: A Little Help from another person toileting, which includes using toliet, bedpan, or urinal?: A Little Help from another person bathing (including washing, rinsing, drying)?: A Little Help from another person to put on and taking off regular upper body clothing?: A Little Help from another person to put on and taking off regular lower body clothing?: A Little 6 Click Score: 19    End of Session    OT Visit  Diagnosis: Muscle weakness (generalized) (M62.81);Unsteadiness on feet (R26.81)   Activity Tolerance Patient tolerated treatment well   Patient Left in bed;with call bell/phone within reach;with chair alarm set   Nurse Communication Mobility status        Time: 3007-6226 OT Time Calculation (min): 28 min  Charges: OT Treatments $Self Care/Home Management : 23-37 mins  Audery Amel., COTA/L Acute Rehabilitation Services [phone]   Angelina Pih 10/23/2019, 10:14 AM

## 2019-10-23 NOTE — Progress Notes (Signed)
PROGRESS NOTE    Tyler [name]  HER:[id number] DOB: 1971-06-09 DOA: 07/23/2019 PCP: Patient, No Pcp Per   Brief Narrative:  HPI on 07/23/2019 by Dr. Lyda Perone Tyler [name] is a 48 y.o. male with medical history significant of polysubstance abuse including heroin.  Brought in to [facility] ED just before [place] after being found down by friend last evening with nearby syringe.    Interim history He was given Narcan x2 with minimal improvement. On presentation he was found to have severe rhabdomyolysis CPK 72,000, elevated liver enzymes AST 2000 ALT 1000 with acute kidney injury BUN 40 creatinine 2.4 and hyperkalemia K 6.2. He was a started on IV fluids, bicarb drip and transferred to [facility] for further care. Initial leukocytosis of 21K, thought to be related to hemoconcentration. UDS negative. Salicylate and acetaminophen negative. CT head and neck were negative for acute traumatic finding. Patient admitted to hospitalist service for rhabdomyolysis, AKI. Rhabdomyolysis has improved, he continued to have persistent encephalopathy and was found to have acute bilateral cerebellar infarct. Neurology was consulted and has subsequently signed off. PT is recommending SNF.Prolonged hospitalization due to placement issues. Assessment & Plan   Acute bilateral ischemic CVA -MRI brain showed acute CVA -MRA unremarkable -Carotid ultrasound showed bilateral 1 to 39% ICA stenosis  -Hemoglobin A1c 5.1, LDL 132 -Echocardiogram EF 60-65%, LV diastolic Doppler parameters consistent with impaired relaxation.  No evidence of LV regional abnormalities -Lower extremity Dopplers negative for DVT -Neurology consulted and appreciated, recommended 30-day cardiac event monitor -Continue statin, aspirin  Acute metabolic encephalopathy -Present on admission -Multifactorial including CVA, alcohol and drug abuse -UDS was negative, Tylenol and salicylate levels negative -TSH and B12 levels normal -EEG showed  moderate diffuse encephalopathy which is nonspecific to etiology but no seizure activity -Waxing and waning mentation, currently stable  Severe rhabdomyolysis, AKI -Resolved with IV fluids -Renal ultrasound unremarkable  Transaminitis with elevated bilirubin -Likely related to alcohol and rhabdomyolysis -RUQ ultrasound showed hepatic steatosis with early cirrhosis, some gallbladder-8 -Resolved  Alcohol abuse -Continue thiamine, multivitamin, folic acid -Currently no withdrawal symptoms  UTI -Completed 5 days of Rocephin  Essential hypertension -Continue metoprolol  Severe protein calorie malnutrition -Continue nutritional support  Abdominal pain/back pain -X-ray showed unchanged T11 compression fracture, age-indeterminate L1 fracture -Continue Voltaren gel as needed  Physical deconditioning -Likely secondary to alcoholism, CVA, prolonged hospitalization -PT, OT recommending SNF -Patient continues to need several cues when doing self-care tasks as with walking.  Does not appear to be safe to be left alone. -patient unaware of simple items -It seems that patient's brother wanted to take patient home however cannot provide 24-hour supervision -reconsulted speech for possible cognitive eval  DVT Prophylaxis  lovenox  Code Status: Full  Family Communication: None at bedside  Disposition Plan: Admitted.  Given degree of physical deconditioning as well as cognitive changes, patient is in need of placement. He does not have family that can help care for him.   Consultants Neurology   Procedures  Echocardiogram Renal US  Antibiotics   Anti-infectives (From admission, onward)   Start     Dose/Rate Route Frequency Ordered Stop   07/25/19 0745  cefTRIAXone (ROCEPHIN) 1 g in sodium chloride 0.9 % 100 mL IVPB     1 g 200 mL/hr over 30 Minutes Intravenous Every 24 hours 07/25/19 0734 07/29/19 1518      Subjective:   Tyler [name] seen and examined today.  Patient  currently sitting up staring out the window.  Denies current chest pain  or shortness of breath, denies nausea or vomiting, abdominal pain, dizziness or headache.   Objective:   Vitals:   10/22/19 1352 10/22/19 2134 10/22/19 2235 10/23/19 0700  BP: 102/70 120/77 126/88 132/86  Pulse: 64 66 84 76  Resp: 16 16 20 16   Temp: 98.1 F (36.7 C)  97.7 F (36.5 C) 98 F (36.7 C)  TempSrc:   Oral Oral  SpO2: 98% 100%    Weight:      Height:        Intake/Output Summary (Last 24 hours) at 10/23/2019 1107 Last data filed at 10/22/2019 2139 Gross per 24 hour  Intake 480 ml  Output 675 ml  Net -195 ml   Filed Weights   10/18/19 0700 10/19/19 0638 10/20/19 0314  Weight: 80.3 kg 80.3 kg 87 kg   Exam  General: Well developed, well nourished, NAD  HEENT: NCAT, mucous membranes moist.   Cardiovascular: S1 S2 auscultated, RRR  Respiratory: Clear to auscultation bilaterally   Abdomen: Soft, nontender, nondistended, + bowel sounds  Extremities: warm dry without cyanosis clubbing or edema  Neuro: AAOx1 (self), nonfocal   Psych: Pleasant, appropriate mood and affect  Data Reviewed: I have personally reviewed following labs and imaging studies  CBC: No results for input(s): WBC, NEUTROABS, HGB, HCT, MCV, PLT in the last 168 hours. Basic Metabolic Panel: Recent Labs  Lab 10/19/19 0231  NA 140  K 3.8  CL 105  CO2 24  GLUCOSE 100*  BUN 25*  CREATININE 1.13  CALCIUM 9.4   GFR: Estimated Creatinine Clearance: 83.8 mL/min (by C-G formula based on SCr of 1.13 mg/dL). Liver Function Tests: No results for input(s): AST, ALT, ALKPHOS, BILITOT, PROT, ALBUMIN in the last 168 hours. No results for input(s): LIPASE, AMYLASE in the last 168 hours. No results for input(s): AMMONIA in the last 168 hours. Coagulation Profile: No results for input(s): INR, PROTIME in the last 168 hours. Cardiac Enzymes: No results for input(s): CKTOTAL, CKMB, CKMBINDEX, TROPONINI in the last 168 hours.  BNP (last 3 results) No results for input(s): PROBNP in the last 8760 hours. HbA1C: No results for input(s): HGBA1C in the last 72 hours. CBG: No results for input(s): GLUCAP in the last 168 hours. Lipid Profile: No results for input(s): CHOL, HDL, LDLCALC, TRIG, CHOLHDL, LDLDIRECT in the last 72 hours. Thyroid Function Tests: No results for input(s): TSH, T4TOTAL, FREET4, T3FREE, THYROIDAB in the last 72 hours. Anemia Panel: No results for input(s): VITAMINB12, FOLATE, FERRITIN, TIBC, IRON, RETICCTPCT in the last 72 hours. Urine analysis:    Component Value Date/Time   COLORURINE AMBER (A) 07/23/2019 2214   APPEARANCEUR CLOUDY (A) 07/23/2019 2214   LABSPEC 1.012 07/23/2019 2214   PHURINE 5.0 07/23/2019 2214   GLUCOSEU 50 (A) 07/23/2019 2214   HGBUR LARGE (A) 07/23/2019 2214   BILIRUBINUR NEGATIVE 07/23/2019 2214   KETONESUR NEGATIVE 07/23/2019 2214   PROTEINUR 30 (A) 07/23/2019 2214   NITRITE NEGATIVE 07/23/2019 2214   LEUKOCYTESUR SMALL (A) 07/23/2019 2214   Sepsis Labs: @LABRCNTIP (procalcitonin:4,lacticidven:4)  )No results found for this or any previous visit (from the past 240 hour(s)).    Radiology Studies: No results found.   Scheduled Meds: .  stroke: mapping our early stages of recovery book   Does not apply Once  . aspirin  325 mg Oral Daily  . atorvastatin  20 mg Oral q1800  . diclofenac sodium  2 g Topical TID AC & HS  . enoxaparin (LOVENOX) injection  40 mg Subcutaneous Q24H  .  feeding supplement (ENSURE ENLIVE)  237 mL Oral BID BM  . folic acid  1 mg Oral Daily  . lactulose  20 g Oral BID  . metoprolol tartrate  25 mg Oral BID  . multivitamin with minerals  1 tablet Oral Daily  . omega-3 acid ethyl esters  1 g Oral Daily  . pantoprazole  40 mg Oral Daily  . sucralfate  1 g Oral TID WC & HS  . thiamine  100 mg Oral Daily   Or  . thiamine  100 mg Intravenous Daily   Continuous Infusions:   LOS: 92 days   Time Spent in minutes   30 minutes   Saeed Toren D.O. on 10/23/2019 at 11:07 AM  Between 7am to 7pm - Please see pager noted on [email or web address]  After 7pm go to [email or web address]  And look for the night coverage person covering for me after hours  [facility] Office  [phone]

## 2019-10-23 NOTE — Progress Notes (Signed)
Physical Therapy Treatment Patient Details Name: Tyler [name] MRN: [id number] DOB: 24-Jul-1971 Today's Date: 10/23/2019    History of Present Illness 48 y/o male transferred from [place] for unresponsiveness, likely secondary to substance abuse. Pt with rhabdomyolysis and AKI. EEG revealed moderate diffuse encephalopathy. PMH includes polysubstance abuse and HTN. Imaging show acute bialteral cerebellar infarcts.     PT Comments    Pt more agitated this session due to cognition challenges.  He appears more frustrated that he is still here.  Helped him call his daughter after session and he seemed thankful for helping.  He was unable to push the numbers into the fall and could not recall the number.  He continues to be a fall risk as he is inattentive to obstacles when out in the halls.  He did recall being at [facility] cone but when asked what month is was he gave a number.  When corrected 2000 was a number was not a month he continued to state the month is 2000.  Will continue PT per POC.  He remains to require 24 hr assistance at d/c which at this time he does not have.    Follow Up Recommendations  SNF;Supervision/Assistance - 24 hour     Equipment Recommendations  None recommended by PT    Recommendations for Other Services       Precautions / Restrictions Precautions Precautions: Fall Precaution Comments: confusion Restrictions Weight Bearing Restrictions: No    Mobility  Bed Mobility Overal bed mobility: Modified Independent       Supine to sit: Modified independent (Device/Increase time) Sit to supine: Modified independent (Device/Increase time)      Transfers Overall transfer level: Needs assistance Equipment used: None Transfers: Sit to/from Stand Sit to Stand: Supervision         General transfer comment: supervision for safety  Ambulation/Gait Ambulation/Gait assistance: Supervision Gait Distance (Feet): 200 Feet Assistive device: None Gait  Pattern/deviations: Step-through pattern;Staggering right Gait velocity: WFL   General Gait Details: Pt easily distracted and still requires cues to negotiate obstacles in halls.  He did find his way back to his room but after several bouts of redirection as he kept attempting to enter other rooms.   Stairs             Wheelchair Mobility    Modified Rankin (Stroke Patients Only)       Balance     Sitting balance-Leahy Scale: Normal       Standing balance-Leahy Scale: Good                              Cognition Arousal/Alertness: Awake/alert Behavior During Therapy: Agitated;Impulsive Overall Cognitive Status: Impaired/Different from baseline Area of Impairment: Orientation;Memory;Attention;Following commands;Problem solving                 Orientation Level: Disoriented to;Time(reports month is "2000") Current Attention Level: Sustained Memory: Decreased short-term memory Following Commands: Follows one step commands with increased time Safety/Judgement: Decreased awareness of safety;Decreased awareness of deficits Awareness: Intellectual Problem Solving: Requires verbal cues;Slow processing;Difficulty sequencing General Comments: Pt more agiatated today during session that previous session.  his agitation appears more frustrated with cognitive challenges. pt able to state he was at [facility] cone this session at start of session and again able to recall at end of session, still slow to process and requires increased time and verbal cues for sequencing. He could not process how to recall the month before  december and was more frustrated.  He did find his way back to his room but i'm not so sure he could repeat this later in the week.      Exercises      General Comments        Pertinent Vitals/Pain Pain Assessment: No/denies pain    Home Living     Available Help at Discharge: Family Type of Home: House              Prior Function             PT Goals (current goals can now be found in the care plan section) Acute Rehab PT Goals Patient Stated Goal: "To get the %$#@ out of here." Potential to Achieve Goals: Fair Progress towards PT goals: Progressing toward goals    Frequency    Min 2X/week      PT Plan Current plan remains appropriate    Co-evaluation              AM-PAC PT "6 Clicks" Mobility   Outcome Measure  Help needed turning from your back to your side while in a flat bed without using bedrails?: None Help needed moving from lying on your back to sitting on the side of a flat bed without using bedrails?: None Help needed moving to and from a bed to a chair (including a wheelchair)?: None Help needed standing up from a chair using your arms (e.g., wheelchair or bedside chair)?: None Help needed to walk in hospital room?: None Help needed climbing 3-5 steps with a railing? : None 6 Click Score: 24    End of Session Equipment Utilized During Treatment: Gait belt Activity Tolerance: Patient tolerated treatment well Patient left: with call bell/phone within reach;in chair;with chair alarm set Nurse Communication: Mobility status PT Visit Diagnosis: Unsteadiness on feet (R26.81);Difficulty in walking, not elsewhere classified (R26.2);Other symptoms and signs involving the nervous system (R29.898)     Time: 5093-2671 PT Time Calculation (min) (ACUTE ONLY): 16 min  Charges:  $Gait Training: 8-22 mins                     Bonney Leitz , PTA Acute Rehabilitation Services Pager [phone] Office [phone]     Tyler [name] Delay 10/23/2019, 5:37 PM

## 2019-10-24 DIAGNOSIS — G9341 Metabolic encephalopathy: Secondary | ICD-10-CM | POA: Diagnosis not present

## 2019-10-24 DIAGNOSIS — I6340 Cerebral infarction due to embolism of unspecified cerebral artery: Secondary | ICD-10-CM | POA: Diagnosis not present

## 2019-10-24 LAB — BASIC METABOLIC PANEL
Anion gap: 11 (ref 5–15)
BUN: 22 mg/dL — ABNORMAL HIGH (ref 6–20)
CO2: 25 mmol/L (ref 22–32)
Calcium: 9.5 mg/dL (ref 8.9–10.3)
Chloride: 104 mmol/L (ref 98–111)
Creatinine, Ser: 1.13 mg/dL (ref 0.61–1.24)
GFR calc Af Amer: >60 mL/min (ref >60)
GFR calc non Af Amer: >60 mL/min (ref >60)
Glucose, Bld: 90 mg/dL (ref 70–99)
Potassium: 4.1 mmol/L (ref 3.5–5.1)
Sodium: 140 mmol/L (ref 135–145)

## 2019-10-24 LAB — CBC
HCT: 43.2 % (ref 39.0–52.0)
Hemoglobin: 14.5 g/dL (ref 13.0–17.0)
MCH: 30.7 pg (ref 26.0–34.0)
MCHC: 33.6 g/dL (ref 30.0–36.0)
MCV: 91.5 fL (ref 80.0–100.0)
Platelets: 243 10*3/uL (ref 150–400)
RBC: 4.72 MIL/uL (ref 4.22–5.81)
RDW: 11.9 % (ref 11.5–15.5)
WBC: 8.2 10*3/uL (ref 4.0–10.5)
nRBC: 0 % (ref 0.0–0.2)

## 2019-10-24 NOTE — Progress Notes (Signed)
Patient ID: Tyler [name], male   DOB: Feb 04, 1971, 48 y.o.   MRN: [id number]  PROGRESS NOTE    Tyler [name]  [id number]:[id number] DOB: [birth year] DOA: 07/23/2019 PCP: Patient, No Pcp Per   Brief Narrative:  48 year old male with history of polysubstance abuse including heroine presented to [facility] ER after being found down with a syringe nearby.  He was given Narcan with minimal improvement.  On presentation, he was found to have severe rhabdomyolysis with acute kidney injury and hyperkalemia.  He was started on IV fluids including bicarb drip and transferred to [facility] for further care.  Rhabdomyolysis improved but he had persistence of encephalopathy and was found to have acute bilateral cerebellar infarct.  Neurology was consulted and has subsequently signed off.  PT recommended SNF placement.  Prolonged hospitalization due to placement issues.  Patient is medically stable for discharge to a skilled nursing facility as soon as bed is available.  Assessment & Plan:  Acute bilateral ischemic CVA, nonhemorrhagic -MRI of the brain showed acute CVA -Neurology evaluated the patient and has signed off.  MRA of the head negative. -Ultrasound carotid showed bilateral 1 to 39% ICA stenosis -LDL 83 -A1c 5.1 -Echo showed EF of 60 to 65%.  Lower extremity Dopplers: Negative for DVT -Neurology recommended considering 30-day cardiac event monitoring as outpatient to rule out A. fib if patient neurologically improves -Continue aspirin and statin. -PT recommended SNF.  Awaiting SNF placement.  Currently medically stable for discharge.  Acute metabolic encephalopathy -Multifactorial: Most likely secondary CVA, drug use, uremia, heavy alcohol use -CT of the head and neck was initially negative for any traumatic finding -UDS, Tylenol and salicylate levels were negative. -TSH, folate, B12 levels all within normal limits -EEG: Generalized diffuse slowing but no seizure activity -Had required intermittent  restraints initially during the hospitalization. -Fall precautions - Awaiting SNF placement.Difficult placement  -Waxing and waning mental status.  Currently stable.  Severe rhabdomyolysis/AKI -Resolved with IV fluids.  Renal ultrasound negative.  Off IV fluids  Transaminitis with elevated bilirubin -Probably from alcohol abuse -Right upper quadrant ultrasound showed hepatic steatosis with early cirrhosis with some gallbladder thickening -Resolved.  Alcohol abuse -Advised to quit.  Continue thiamine, multivitamin and folic acid  UTI -Present on admission.  Completed 5 days of Rocephin  Essential hypertension -Stable.  Continue metoprolol.  Severe protein calorie malnutrition -Follow nutrition recommendations  Abdominal pain/back pain -X-ray showed unchanged T11 compression fracture, age-indeterminate L1 fracture -Continue Voltaren gel as needed.  Generalized deconditioning -Likely secondary to alcoholism, CVA, prolonged hospitalization -PT, OT recommending SNF -Patient continues to need several cues when doing self-care tasks as with walking.  Does not appear to be safe to be left alone. -patient unaware of simple items -It seems that patient's brother wanted to take patient home however cannot provide 24-hour supervision   DVT prophylaxis: Lovenox Code Status: Full Family Communication: None at bedside Disposition Plan: SNF once bed is available.  Medically stable for discharge.  Consultants: Neurology  Procedures:  Echo IMPRESSIONS   1. The left ventricle has normal systolic function with an ejection fraction of 60-65%. The cavity size was normal. Left ventricular diastolic Doppler parameters are consistent with impaired relaxation. No evidence of left ventricular regional wall  motion abnormalities. 2. The right ventricle has normal systolic function. The cavity was normal. There is no increase in right ventricular wall thickness. Right ventricular systolic  pressure is normal with an estimated pressure of 21.0 mmHg. 3. The aortic valve is tricuspid. Moderate sclerosis of  the aortic valve. 4. The aorta is normal unless otherwise noted.  Antimicrobials:  Anti-infectives (From admission, onward)   Start     Dose/Rate Route Frequency Ordered Stop   07/25/19 0745  cefTRIAXone (ROCEPHIN) 1 g in sodium chloride 0.9 % 100 mL IVPB     1 g 200 mL/hr over 30 Minutes Intravenous Every 24 hours 07/25/19 0734 07/29/19 1518       Subjective: Patient seen and examined at bedside.  Very poor historian.  No overnight fever, vomiting, agitation or seizure-like activities noted by nursing staff.   Objective: Vitals:   10/22/19 2235 10/23/19 0700 10/23/19 2106 10/24/19 0524  BP: 126/88 132/86 115/88 95/74  Pulse: 84 76 70 67  Resp: 20 16 16    Temp: 97.7 F (36.5 C) 98 F (36.7 C) 98.7 F (37.1 C) 98.4 F (36.9 C)  TempSrc: Oral Oral Oral Oral  SpO2:   98% 97%  Weight:    70.8 kg  Height:        Intake/Output Summary (Last 24 hours) at 10/24/2019 1052 Last data filed at 10/23/2019 2100 Gross per 24 hour  Intake 240 ml  Output 1500 ml  Net -1260 ml   Filed Weights   10/19/19 0638 10/20/19 0314 10/24/19 0524  Weight: 80.3 kg 87 kg 70.8 kg    Examination: General exam:No distress.  Hardly participates in any conversation.  Respiratory system: Bilateral decreased breath sounds at bases  cardiovascular system:Rate controlled, S1-S2 heard gastrointestinal system:Abdomen is nondistended, soft and nontender. Normal bowel sounds heard. Extremities: No cyanosis, edema    Data Reviewed: I have personally reviewed following labs and imaging studies  CBC: Recent Labs  Lab 10/24/19 0256  WBC 8.2  HGB 14.5  HCT 43.2  MCV 91.5  PLT 243   Basic Metabolic Panel: Recent Labs  Lab 10/19/19 0231 10/24/19 0256  NA 140 140  K 3.8 4.1  CL 105 104  CO2 24 25  GLUCOSE 100* 90  BUN 25* 22*  CREATININE 1.13 1.13  CALCIUM 9.4 9.5    GFR: Estimated Creatinine Clearance: 75.7 mL/min (by C-G formula based on SCr of 1.13 mg/dL). Liver Function Tests: No results for input(s): AST, ALT, ALKPHOS, BILITOT, PROT, ALBUMIN in the last 168 hours. No results for input(s): LIPASE, AMYLASE in the last 168 hours. No results for input(s): AMMONIA in the last 168 hours. Coagulation Profile: No results for input(s): INR, PROTIME in the last 168 hours. Cardiac Enzymes: No results for input(s): CKTOTAL, CKMB, CKMBINDEX, TROPONINI in the last 168 hours. BNP (last 3 results) No results for input(s): PROBNP in the last 8760 hours. HbA1C: No results for input(s): HGBA1C in the last 72 hours. CBG: No results for input(s): GLUCAP in the last 168 hours. Lipid Profile: No results for input(s): CHOL, HDL, LDLCALC, TRIG, CHOLHDL, LDLDIRECT in the last 72 hours. Thyroid Function Tests: No results for input(s): TSH, T4TOTAL, FREET4, T3FREE, THYROIDAB in the last 72 hours. Anemia Panel: No results for input(s): VITAMINB12, FOLATE, FERRITIN, TIBC, IRON, RETICCTPCT in the last 72 hours. Sepsis Labs: No results for input(s): PROCALCITON, LATICACIDVEN in the last 168 hours.  No results found for this or any previous visit (from the past 240 hour(s)).       Radiology Studies: No results found.      Scheduled Meds: .  stroke: mapping our early stages of recovery book   Does not apply Once  . aspirin  325 mg Oral Daily  . atorvastatin  20 mg Oral q1800  .  diclofenac sodium  2 g Topical TID AC & HS  . enoxaparin (LOVENOX) injection  40 mg Subcutaneous Q24H  . feeding supplement (ENSURE ENLIVE)  237 mL Oral BID BM  . folic acid  1 mg Oral Daily  . lactulose  20 g Oral BID  . metoprolol tartrate  25 mg Oral BID  . multivitamin with minerals  1 tablet Oral Daily  . omega-3 acid ethyl esters  1 g Oral Daily  . pantoprazole  40 mg Oral Daily  . sucralfate  1 g Oral TID WC & HS  . thiamine  100 mg Oral Daily   Or  . thiamine  100 mg  Intravenous Daily   Continuous Infusions:        Aline August, MD [facility] 10/24/2019, 10:52 AM

## 2019-10-24 NOTE — Progress Notes (Signed)
Occupational Therapy Treatment Patient Details Name: Tyler [name] MRN: [id number] DOB: July 27, 1971 Today's Date: 10/24/2019    History of present illness 48 y/o male transferred from [place] for unresponsiveness, likely secondary to substance abuse. Pt with rhabdomyolysis and AKI. EEG revealed moderate diffuse encephalopathy. PMH includes polysubstance abuse and HTN. Imaging show acute bialteral cerebellar infarcts.    OT comments  Session focused on addressing safety and cognition during ADL completion. Pt required supervision during grooming, requiring vc to rinse toothpaste from his mouth. Pt required Supervision during toileting. Pt continues to demonstrate cognitive and physical limitations impacting safety and independence with ADL/IADL and functional mobility. Pt will continue to benefit from skilled OT services to maximize safety and independence with ADL/IADL and functional mobility. Will continue to follow acutely and progress as tolerated.    Follow Up Recommendations  SNF;Supervision/Assistance - 24 hour    Equipment Recommendations  None recommended by OT    Recommendations for Other Services      Precautions / Restrictions Precautions Precautions: Fall Precaution Comments: confusion Restrictions Weight Bearing Restrictions: No       Mobility Bed Mobility Overal bed mobility: Modified Independent                Transfers Overall transfer level: Needs assistance Equipment used: None Transfers: Sit to/from Stand Sit to Stand: Supervision         General transfer comment: supervision for safety    Balance Overall balance assessment: Needs assistance Sitting-balance support: Feet supported Sitting balance-Leahy Scale: Normal Sitting balance - Comments: able to sit unsupport with no LOB   Standing balance support: During functional activity;No upper extremity supported Standing balance-Leahy Scale: Good                              ADL either performed or assessed with clinical judgement   ADL Overall ADL's : Needs assistance/impaired Eating/Feeding: Independent;Sitting   Grooming: Wash/dry hands;Supervision/safety;Standing;Oral care Grooming Details (indicate cue type and reason): squeezed tube of toothpaste in mouth, vc to rinse it out                 Toilet Transfer: Supervision/safety;Ambulation Toilet Transfer Details (indicate cue type and reason): completed toileting Toileting- Clothing Manipulation and Hygiene: Supervision/safety       Functional mobility during ADLs: Supervision/safety;Cueing for safety;Cueing for sequencing General ADL Comments: pt safety risk during ADL due to cognitive limitations     Vision   Vision Assessment?: Vision impaired- to be further tested in functional context Additional Comments: pt reports no difficulty with vision;during tic tac toe, pt would frequently keep one eye shut;would place two marks in box during game, despite cues on rules, unsure cognitive or visual limitations;undershoots with trail making;requires increased time to visually scan the environment   Perception     Praxis      Cognition Arousal/Alertness: Awake/alert Behavior During Therapy: Impulsive;WFL for tasks assessed/performed Overall Cognitive Status: Impaired/Different from baseline Area of Impairment: Orientation;Memory;Attention;Following commands;Problem solving                 Orientation Level: Disoriented to;Situation;Time Current Attention Level: Sustained Memory: Decreased short-term memory Following Commands: Follows one step commands with increased time Safety/Judgement: Decreased awareness of safety;Decreased awareness of deficits Awareness: Intellectual Problem Solving: Requires verbal cues;Slow processing;Difficulty sequencing General Comments: pt demonstrates difficulty with problem solving, working memory, and executive functioning; Pt required max assistance  to complete trail making sheet, demonstrated difficulty with working memory;pt wrote  K and stated they were X for game of tic tac toe, pt verbalized he understood how to play, but required assistance to problem solve appropriate way to block therapist from winning game;pt squeezed toothpaste in his mouth, required mod cues to rinse mouth out, pt stated it was "jello"        Exercises     Shoulder Instructions       General Comments VSS    Pertinent Vitals/ Pain       Pain Assessment: No/denies pain  Home Living                                          Prior Functioning/Environment              Frequency  Min 1X/week        Progress Toward Goals  OT Goals(current goals can now be found in the care plan section)  Progress towards OT goals: Progressing toward goals  Acute Rehab OT Goals Patient Stated Goal: to get out of here OT Goal Formulation: With patient Time For Goal Achievement: 10/30/19 Potential to Achieve Goals: Good ADL Goals Pt Will Perform Grooming: Independently;standing Pt Will Perform Upper Body Bathing: Independently;sitting Pt Will Perform Lower Body Bathing: Independently;sit to/from stand Pt Will Perform Upper Body Dressing: Independently;sitting Pt Will Perform Lower Body Dressing: Independently;sit to/from stand Pt Will Transfer to Toilet: with modified independence;regular height toilet Pt Will Perform Toileting - Clothing Manipulation and hygiene: Independently;sit to/from stand Pt Will Perform Tub/Shower Transfer: Independently;shower seat;ambulating;grab bars Additional ADL Goal #1: pt will identify 3/3 ADL/toiletry items and retrieve them in prep for selfcare Additional ADL Goal #2: Pt will complete trail making task with <3 VC's for safe and successful completion Additional ADL Goal #3: Pt will complete 3 step task with <3 sequencing cues  Plan Discharge plan remains appropriate    Co-evaluation                  AM-PAC OT "6 Clicks" Daily Activity     Outcome Measure   Help from another person eating meals?: None Help from another person taking care of personal grooming?: A Little Help from another person toileting, which includes using toliet, bedpan, or urinal?: A Little Help from another person bathing (including washing, rinsing, drying)?: A Little Help from another person to put on and taking off regular upper body clothing?: A Little Help from another person to put on and taking off regular lower body clothing?: A Little 6 Click Score: 19    End of Session    OT Visit Diagnosis: Muscle weakness (generalized) (M62.81);Unsteadiness on feet (R26.81)   Activity Tolerance Patient tolerated treatment well   Patient Left in bed;with call bell/phone within reach;with chair alarm set   Nurse Communication Mobility status        Time: 9390-3009 OT Time Calculation (min): 21 min  Charges: OT General Charges $OT Visit: 1 Visit OT Treatments $Self Care/Home Management : 8-22 mins  Diona Browner OTR/L Acute Rehabilitation Services Office: [phone]    Rebeca Alert 10/24/2019, 4:18 PM

## 2019-10-25 DIAGNOSIS — G9341 Metabolic encephalopathy: Secondary | ICD-10-CM | POA: Diagnosis not present

## 2019-10-25 DIAGNOSIS — I6340 Cerebral infarction due to embolism of unspecified cerebral artery: Secondary | ICD-10-CM | POA: Diagnosis not present

## 2019-10-25 NOTE — Progress Notes (Signed)
Patient ID: Tyler [name], male   DOB: 1971-03-19, 47 y.o.   MRN: [id number]  PROGRESS NOTE    Micco Bourbeau  WUJ:[id number] DOB: 06/26/1971 DOA: 07/23/2019 PCP: Patient, No Pcp Per   Brief Narrative:  48 year old male with history of polysubstance abuse including heroine presented to [facility] ER after being found down with a syringe nearby.  He was given Narcan with minimal improvement.  On presentation, he was found to have severe rhabdomyolysis with acute kidney injury and hyperkalemia.  He was started on IV fluids including bicarb drip and transferred to [facility] for further care.  Rhabdomyolysis improved but he had persistence of encephalopathy and was found to have acute bilateral cerebellar infarct.  Neurology was consulted and has subsequently signed off.  PT recommended SNF placement.  Prolonged hospitalization due to placement issues.  Patient is medically stable for discharge to a skilled nursing facility as soon as bed is available.  Assessment & Plan:  Acute bilateral ischemic CVA, nonhemorrhagic -MRI of the brain showed acute CVA -Neurology evaluated the patient and has signed off.  MRA of the head negative. -Ultrasound carotid showed bilateral 1 to 39% ICA stenosis -LDL 83 -A1c 5.1 -Echo showed EF of 60 to 65%.  Lower extremity Dopplers: Negative for DVT -Neurology recommended considering 30-day cardiac event monitoring as outpatient to rule out A. fib if patient neurologically improves -Continue aspirin and statin. -PT recommended SNF.  Awaiting SNF placement.  Currently medically stable for discharge.  Acute metabolic encephalopathy -Multifactorial: Most likely secondary CVA, drug use, uremia, heavy alcohol use -CT of the head and neck was initially negative for any traumatic finding -UDS, Tylenol and salicylate levels were negative. -TSH, folate, B12 levels all within normal limits -EEG: Generalized diffuse slowing but no seizure activity -Had required intermittent  restraints initially during the hospitalization. -Fall precautions - Awaiting SNF placement.Difficult placement  -Waxing and waning mental status.  Currently stable.  Severe rhabdomyolysis/AKI -Resolved with IV fluids.  Renal ultrasound negative.  Off IV fluids  Transaminitis with elevated bilirubin -Probably from alcohol abuse -Right upper quadrant ultrasound showed hepatic steatosis with early cirrhosis with some gallbladder thickening -Resolved.  Alcohol abuse -Advised to quit.  Continue thiamine, multivitamin and folic acid  UTI -Present on admission.  Completed 5 days of Rocephin  Essential hypertension -Stable.  Continue metoprolol.  Severe protein calorie malnutrition -Follow nutrition recommendations  Abdominal pain/back pain -X-ray showed unchanged T11 compression fracture, age-indeterminate L1 fracture -Continue Voltaren gel as needed.  Generalized deconditioning -Likely secondary to alcoholism, CVA, prolonged hospitalization -PT, OT recommending SNF -Patient continues to need several cues when doing self-care tasks as with walking.  Does not appear to be safe to be left alone. -patient unaware of simple items -It seems that patient's brother wanted to take patient home however cannot provide 24-hour supervision   DVT prophylaxis: Lovenox Code Status: Full Family Communication: None at bedside Disposition Plan: SNF once bed is available.  Medically stable for discharge.  Consultants: Neurology  Procedures:  Echo IMPRESSIONS   1. The left ventricle has normal systolic function with an ejection fraction of 60-65%. The cavity size was normal. Left ventricular diastolic Doppler parameters are consistent with impaired relaxation. No evidence of left ventricular regional wall  motion abnormalities. 2. The right ventricle has normal systolic function. The cavity was normal. There is no increase in right ventricular wall thickness. Right ventricular systolic  pressure is normal with an estimated pressure of 21.0 mmHg. 3. The aortic valve is tricuspid. Moderate sclerosis of  the aortic valve. 4. The aorta is normal unless otherwise noted.  Antimicrobials:  Anti-infectives (From admission, onward)   Start     Dose/Rate Route Frequency Ordered Stop   07/25/19 0745  cefTRIAXone (ROCEPHIN) 1 g in sodium chloride 0.9 % 100 mL IVPB     1 g 200 mL/hr over 30 Minutes Intravenous Every 24 hours 07/25/19 0734 07/29/19 1518       Subjective: Patient seen and examined at bedside.  Very poor historian.  No fever, vomiting or agitation reported.  Objective: Vitals:   10/24/19 0524 10/24/19 1445 10/24/19 2120 10/25/19 0532  BP: 95/74 114/80 106/88 107/86  Pulse: 67 66 77 65  Resp:  16 18 18   Temp: 98.4 F (36.9 C) 98.3 F (36.8 C) 97.7 F (36.5 C) 97.8 F (36.6 C)  TempSrc: Oral Oral    SpO2: 97% 100% 98% 98%  Weight: 70.8 kg     Height:        Intake/Output Summary (Last 24 hours) at 10/25/2019 0752 Last data filed at 10/25/2019 1448 Gross per 24 hour  Intake 300 ml  Output 300 ml  Net 0 ml   Filed Weights   10/19/19 0638 10/20/19 0314 10/24/19 0524  Weight: 80.3 kg 87 kg 70.8 kg    Examination: General exam: No acute distress.  Hardly participates in any conversation.  Respiratory system: Bilateral decreased breath sounds at bases.  No wheezing  cardiovascular system:Rate controlled, S1-S2 heard gastrointestinal system:Abdomen is nondistended, soft and nontender. Normal bowel sounds heard. Extremities: No cyanosis, edema    Data Reviewed: I have personally reviewed following labs and imaging studies  CBC: Recent Labs  Lab 10/24/19 0256  WBC 8.2  HGB 14.5  HCT 43.2  MCV 91.5  PLT 185   Basic Metabolic Panel: Recent Labs  Lab 10/19/19 0231 10/24/19 0256  NA 140 140  K 3.8 4.1  CL 105 104  CO2 24 25  GLUCOSE 100* 90  BUN 25* 22*  CREATININE 1.13 1.13  CALCIUM 9.4 9.5   GFR: Estimated Creatinine  Clearance: 75.7 mL/min (by C-G formula based on SCr of 1.13 mg/dL). Liver Function Tests: No results for input(s): AST, ALT, ALKPHOS, BILITOT, PROT, ALBUMIN in the last 168 hours. No results for input(s): LIPASE, AMYLASE in the last 168 hours. No results for input(s): AMMONIA in the last 168 hours. Coagulation Profile: No results for input(s): INR, PROTIME in the last 168 hours. Cardiac Enzymes: No results for input(s): CKTOTAL, CKMB, CKMBINDEX, TROPONINI in the last 168 hours. BNP (last 3 results) No results for input(s): PROBNP in the last 8760 hours. HbA1C: No results for input(s): HGBA1C in the last 72 hours. CBG: No results for input(s): GLUCAP in the last 168 hours. Lipid Profile: No results for input(s): CHOL, HDL, LDLCALC, TRIG, CHOLHDL, LDLDIRECT in the last 72 hours. Thyroid Function Tests: No results for input(s): TSH, T4TOTAL, FREET4, T3FREE, THYROIDAB in the last 72 hours. Anemia Panel: No results for input(s): VITAMINB12, FOLATE, FERRITIN, TIBC, IRON, RETICCTPCT in the last 72 hours. Sepsis Labs: No results for input(s): PROCALCITON, LATICACIDVEN in the last 168 hours.  No results found for this or any previous visit (from the past 240 hour(s)).       Radiology Studies: No results found.      Scheduled Meds: .  stroke: mapping our early stages of recovery book   Does not apply Once  . aspirin  325 mg Oral Daily  . atorvastatin  20 mg Oral q1800  . diclofenac  sodium  2 g Topical TID AC & HS  . enoxaparin (LOVENOX) injection  40 mg Subcutaneous Q24H  . feeding supplement (ENSURE ENLIVE)  237 mL Oral BID BM  . folic acid  1 mg Oral Daily  . lactulose  20 g Oral BID  . metoprolol tartrate  25 mg Oral BID  . multivitamin with minerals  1 tablet Oral Daily  . omega-3 acid ethyl esters  1 g Oral Daily  . pantoprazole  40 mg Oral Daily  . sucralfate  1 g Oral TID WC & HS  . thiamine  100 mg Oral Daily   Or  . thiamine  100 mg Intravenous Daily   Continuous  Infusions:        Glade LloydKshitiz Lanijah Warzecha, MD [facility] 10/25/2019, 7:52 AM

## 2019-10-25 NOTE — Progress Notes (Signed)
Nutrition Follow-up  DOCUMENTATION CODES:   Severe malnutrition in context of acute illness/injury  INTERVENTION:  - continue Ensure Enlive BID and Magic Cup TID.  - continue to encourage PO intakes.    NUTRITION DIAGNOSIS:   Severe Malnutrition related to acute illness(acute toxic/metabolic encephalopathy) as evidenced by moderate fat depletion, severe fat depletion, moderate muscle depletion, severe muscle depletion, energy intake < or equal to 50% for > or equal to 5 days, percent weight loss. -improving  GOAL:   Patient will meet greater than or equal to 90% of their needs -met on average  MONITOR:   PO intake, Supplement acceptance, Labs, Weight trends, Skin, I & O's  ASSESSMENT:   Tyler [name] is a 48 y.o. male with medical history significant of polysubstance abuse including heroin.  Brought in to [facility] ED just before MN after being found down by friend last evening with nearby syringe.  Weight is beginning to trend back down. Flow sheet documentation indicates mainly 100% intakes of meals over the past 1 week. Patient is a/o to self only.  Per notes: - acute bilateral ischemic CVA--Neuro has signed off - PT recommends SNF--pending SNF placement - acute metabolic encephalopathy--thought to be 2/2 CVA and other factors (alcohol abuse/drug use) - severe rhabdomyolysis and AKI--resolved  - transaminitis with elevated bilirubin--RUQ ultrasound showed hepatic steatosis with early cirrhosis--resolved  - severe PCM - abdominal and back pain--T11 compression fx and age-indeterminate L1 fracture - medically stable for discharge and pending SNF bed    Labs reviewed. Medications reviewed; 1 mg folvite/day, daily multivitamin with minerals, 1 g lovaza/day, 40 mg oral protonix/day, 1 g carafate TID, 100 mg thiamine/day.     Diet Order:   Diet Order            DIET DYS 3 Room service appropriate? No; Fluid consistency: Thin  Diet effective now              EDUCATION  NEEDS:   Not appropriate for education at this time  Skin:  Skin Assessment: Reviewed RN Assessment  Last BM:  11/17  Height:   Ht Readings from Last 1 Encounters:  08/09/19 _0  (1.727 m)    Weight:   Wt Readings from Last 1 Encounters:  10/24/19 70.8 kg    Ideal Body Weight:  70 kg  BMI:  Body mass index is 23.72 kg/m.  Estimated Nutritional Needs:   Kcal:  2000-2200  Protein:  105-120 grams  Fluid:  > 2 L     Jarome Matin, MS, RD, LDN, [facility] Inpatient Clinical Dietitian Pager # [phone] After hours/weekend pager # [phone]

## 2019-10-26 DIAGNOSIS — I6340 Cerebral infarction due to embolism of unspecified cerebral artery: Secondary | ICD-10-CM | POA: Diagnosis not present

## 2019-10-26 DIAGNOSIS — G9341 Metabolic encephalopathy: Secondary | ICD-10-CM | POA: Diagnosis not present

## 2019-10-26 NOTE — Progress Notes (Signed)
Patient ID: Tyler [name], male   DOB: 1971-03-19, 47 y.o.   MRN: [id number]  PROGRESS NOTE    Tyler [name]  WUJ:[id number] DOB: 06/26/1971 DOA: 07/23/2019 PCP: Patient, No Pcp Per   Brief Narrative:  48 year old male with history of polysubstance abuse including heroine presented to [facility] ER after being found down with a syringe nearby.  He was given Narcan with minimal improvement.  On presentation, he was found to have severe rhabdomyolysis with acute kidney injury and hyperkalemia.  He was started on IV fluids including bicarb drip and transferred to [facility] for further care.  Rhabdomyolysis improved but he had persistence of encephalopathy and was found to have acute bilateral cerebellar infarct.  Neurology was consulted and has subsequently signed off.  PT recommended SNF placement.  Prolonged hospitalization due to placement issues.  Patient is medically stable for discharge to a skilled nursing facility as soon as bed is available.  Assessment & Plan:  Acute bilateral ischemic CVA, nonhemorrhagic -MRI of the brain showed acute CVA -Neurology evaluated the patient and has signed off.  MRA of the head negative. -Ultrasound carotid showed bilateral 1 to 39% ICA stenosis -LDL 83 -A1c 5.1 -Echo showed EF of 60 to 65%.  Lower extremity Dopplers: Negative for DVT -Neurology recommended considering 30-day cardiac event monitoring as outpatient to rule out A. fib if patient neurologically improves -Continue aspirin and statin. -PT recommended SNF.  Awaiting SNF placement.  Currently medically stable for discharge.  Acute metabolic encephalopathy -Multifactorial: Most likely secondary CVA, drug use, uremia, heavy alcohol use -CT of the head and neck was initially negative for any traumatic finding -UDS, Tylenol and salicylate levels were negative. -TSH, folate, B12 levels all within normal limits -EEG: Generalized diffuse slowing but no seizure activity -Had required intermittent  restraints initially during the hospitalization. -Fall precautions - Awaiting SNF placement.Difficult placement  -Waxing and waning mental status.  Currently stable.  Severe rhabdomyolysis/AKI -Resolved with IV fluids.  Renal ultrasound negative.  Off IV fluids  Transaminitis with elevated bilirubin -Probably from alcohol abuse -Right upper quadrant ultrasound showed hepatic steatosis with early cirrhosis with some gallbladder thickening -Resolved.  Alcohol abuse -Advised to quit.  Continue thiamine, multivitamin and folic acid  UTI -Present on admission.  Completed 5 days of Rocephin  Essential hypertension -Stable.  Continue metoprolol.  Severe protein calorie malnutrition -Follow nutrition recommendations  Abdominal pain/back pain -X-ray showed unchanged T11 compression fracture, age-indeterminate L1 fracture -Continue Voltaren gel as needed.  Generalized deconditioning -Likely secondary to alcoholism, CVA, prolonged hospitalization -PT, OT recommending SNF -Patient continues to need several cues when doing self-care tasks as with walking.  Does not appear to be safe to be left alone. -patient unaware of simple items -It seems that patient's brother wanted to take patient home however cannot provide 24-hour supervision   DVT prophylaxis: Lovenox Code Status: Full Family Communication: None at bedside Disposition Plan: SNF once bed is available.  Medically stable for discharge.  Consultants: Neurology  Procedures:  Echo IMPRESSIONS   1. The left ventricle has normal systolic function with an ejection fraction of 60-65%. The cavity size was normal. Left ventricular diastolic Doppler parameters are consistent with impaired relaxation. No evidence of left ventricular regional wall  motion abnormalities. 2. The right ventricle has normal systolic function. The cavity was normal. There is no increase in right ventricular wall thickness. Right ventricular systolic  pressure is normal with an estimated pressure of 21.0 mmHg. 3. The aortic valve is tricuspid. Moderate sclerosis of  the aortic valve. 4. The aorta is normal unless otherwise noted.  Antimicrobials:  Anti-infectives (From admission, onward)   Start     Dose/Rate Route Frequency Ordered Stop   07/25/19 0745  cefTRIAXone (ROCEPHIN) 1 g in sodium chloride 0.9 % 100 mL IVPB     1 g 200 mL/hr over 30 Minutes Intravenous Every 24 hours 07/25/19 0734 07/29/19 1518       Subjective: Patient seen and examined at bedside.  More awake and pleasant this morning.  Very poor historian.  No fever, vomiting or agitation reported. Objective: Vitals:   10/25/19 1239 10/25/19 2134 10/25/19 2319 10/26/19 0515  BP: 129/82 (!) 117/94 (!) 119/92 (!) 117/96  Pulse: 72 72 73 66  Resp: 17     Temp: 98 F (36.7 C) 98 F (36.7 C)  97.6 F (36.4 C)  TempSrc: Oral Oral  Oral  SpO2: 99% 100%  100%  Weight:      Height:        Intake/Output Summary (Last 24 hours) at 10/26/2019 0742 Last data filed at 10/25/2019 1239 Gross per 24 hour  Intake 410 ml  Output 300 ml  Net 110 ml   Filed Weights   10/19/19 0638 10/20/19 0314 10/24/19 0524  Weight: 80.3 kg 87 kg 70.8 kg    Examination: General exam: No distress.  Does not participate in conversation much. Respiratory system: Bilateral decreased breath sounds at bases  cardiovascular system:Rate controlled, S1-S2 heard gastrointestinal system:Abdomen is nondistended, soft and nontender. Normal bowel sounds heard. Extremities: No cyanosis, edema    Data Reviewed: I have personally reviewed following labs and imaging studies  CBC: Recent Labs  Lab 10/24/19 0256  WBC 8.2  HGB 14.5  HCT 43.2  MCV 91.5  PLT 243   Basic Metabolic Panel: Recent Labs  Lab 10/24/19 0256  NA 140  K 4.1  CL 104  CO2 25  GLUCOSE 90  BUN 22*  CREATININE 1.13  CALCIUM 9.5   GFR: Estimated Creatinine Clearance: 75.7 mL/min (by C-G formula based on SCr of  1.13 mg/dL). Liver Function Tests: No results for input(s): AST, ALT, ALKPHOS, BILITOT, PROT, ALBUMIN in the last 168 hours. No results for input(s): LIPASE, AMYLASE in the last 168 hours. No results for input(s): AMMONIA in the last 168 hours. Coagulation Profile: No results for input(s): INR, PROTIME in the last 168 hours. Cardiac Enzymes: No results for input(s): CKTOTAL, CKMB, CKMBINDEX, TROPONINI in the last 168 hours. BNP (last 3 results) No results for input(s): PROBNP in the last 8760 hours. HbA1C: No results for input(s): HGBA1C in the last 72 hours. CBG: No results for input(s): GLUCAP in the last 168 hours. Lipid Profile: No results for input(s): CHOL, HDL, LDLCALC, TRIG, CHOLHDL, LDLDIRECT in the last 72 hours. Thyroid Function Tests: No results for input(s): TSH, T4TOTAL, FREET4, T3FREE, THYROIDAB in the last 72 hours. Anemia Panel: No results for input(s): VITAMINB12, FOLATE, FERRITIN, TIBC, IRON, RETICCTPCT in the last 72 hours. Sepsis Labs: No results for input(s): PROCALCITON, LATICACIDVEN in the last 168 hours.  No results found for this or any previous visit (from the past 240 hour(s)).       Radiology Studies: No results found.      Scheduled Meds: .  stroke: mapping our early stages of recovery book   Does not apply Once  . aspirin  325 mg Oral Daily  . atorvastatin  20 mg Oral q1800  . diclofenac sodium  2 g Topical TID AC & HS  .  enoxaparin (LOVENOX) injection  40 mg Subcutaneous Q24H  . feeding supplement (ENSURE ENLIVE)  237 mL Oral BID BM  . folic acid  1 mg Oral Daily  . lactulose  20 g Oral BID  . metoprolol tartrate  25 mg Oral BID  . multivitamin with minerals  1 tablet Oral Daily  . omega-3 acid ethyl esters  1 g Oral Daily  . pantoprazole  40 mg Oral Daily  . sucralfate  1 g Oral TID WC & HS  . thiamine  100 mg Oral Daily   Or  . thiamine  100 mg Intravenous Daily   Continuous Infusions:        Aline August, MD [facility] 10/26/2019, 7:42 AM

## 2019-10-27 DIAGNOSIS — G9341 Metabolic encephalopathy: Secondary | ICD-10-CM | POA: Diagnosis not present

## 2019-10-27 DIAGNOSIS — I6340 Cerebral infarction due to embolism of unspecified cerebral artery: Secondary | ICD-10-CM | POA: Diagnosis not present

## 2019-10-27 NOTE — Progress Notes (Signed)
Patient ID: Tyler [name], male   DOB: 1971-10-06, 48 y.o.   MRN: [id number]  PROGRESS NOTE    Hawk Mones  NKN:[id number] DOB: 1971/07/02 DOA: 07/23/2019 PCP: Patient, No Pcp Per   Brief Narrative:  48 year old male with history of polysubstance abuse including heroine presented to [facility] ER after being found down with a syringe nearby.  He was given Narcan with minimal improvement.  On presentation, he was found to have severe rhabdomyolysis with acute kidney injury and hyperkalemia.  He was started on IV fluids including bicarb drip and transferred to [facility] for further care.  Rhabdomyolysis improved but he had persistence of encephalopathy and was found to have acute bilateral cerebellar infarct.  Neurology was consulted and has subsequently signed off.  PT recommended SNF placement.  Prolonged hospitalization due to placement issues.  Patient is medically stable for discharge to a skilled nursing facility as soon as bed is available.  Assessment & Plan:  Acute bilateral ischemic CVA, nonhemorrhagic -MRI of the brain showed acute CVA -Neurology evaluated the patient and has signed off.  MRA of the head negative. -Ultrasound carotid showed bilateral 1 to 39% ICA stenosis -LDL 83 -A1c 5.1 -Echo showed EF of 60 to 65%.  Lower extremity Dopplers: Negative for DVT -Neurology recommended considering 30-day cardiac event monitoring as outpatient to rule out A. fib if patient neurologically improves -Continue aspirin and statin. -PT recommended SNF.  Awaiting SNF placement.  Currently medically stable for discharge.  Acute metabolic encephalopathy -Multifactorial: Most likely secondary CVA, drug use, uremia, heavy alcohol use -CT of the head and neck was initially negative for any traumatic finding -UDS, Tylenol and salicylate levels were negative. -TSH, folate, B12 levels all within normal limits -EEG: Generalized diffuse slowing but no seizure activity -Had required intermittent  restraints initially during the hospitalization. -Fall precautions - Awaiting SNF placement.Difficult placement  -Waxing and waning mental status.  Currently stable.  Severe rhabdomyolysis/AKI -Resolved with IV fluids.  Renal ultrasound negative.  Off IV fluids  Transaminitis with elevated bilirubin -Probably from alcohol abuse -Right upper quadrant ultrasound showed hepatic steatosis with early cirrhosis with some gallbladder thickening -Resolved.  Alcohol abuse -Advised to quit.  Continue thiamine, multivitamin and folic acid  UTI -Present on admission.  Completed 5 days of Rocephin  Essential hypertension -Stable.  Continue metoprolol.  Severe protein calorie malnutrition -Follow nutrition recommendations  Abdominal pain/back pain -X-ray showed unchanged T11 compression fracture, age-indeterminate L1 fracture -Continue Voltaren gel as needed.  Generalized deconditioning -Likely secondary to alcoholism, CVA, prolonged hospitalization -PT, OT recommending SNF -Patient continues to need several cues when doing self-care tasks as with walking.  Does not appear to be safe to be left alone. -patient unaware of simple items -It seems that patient's brother wanted to take patient home however cannot provide 24-hour supervision   DVT prophylaxis: Lovenox Code Status: Full Family Communication: None at bedside Disposition Plan: SNF once bed is available.  Medically stable for discharge.  Consultants: Neurology  Procedures:  Echo IMPRESSIONS   1. The left ventricle has normal systolic function with an ejection fraction of 60-65%. The cavity size was normal. Left ventricular diastolic Doppler parameters are consistent with impaired relaxation. No evidence of left ventricular regional wall  motion abnormalities. 2. The right ventricle has normal systolic function. The cavity was normal. There is no increase in right ventricular wall thickness. Right ventricular systolic  pressure is normal with an estimated pressure of 21.0 mmHg. 3. The aortic valve is tricuspid. Moderate sclerosis of  the aortic valve. 4. The aorta is normal unless otherwise noted.  Antimicrobials:  Anti-infectives (From admission, onward)   Start     Dose/Rate Route Frequency Ordered Stop   07/25/19 0745  cefTRIAXone (ROCEPHIN) 1 g in sodium chloride 0.9 % 100 mL IVPB     1 g 200 mL/hr over 30 Minutes Intravenous Every 24 hours 07/25/19 0734 07/29/19 1518       Subjective: Patient seen and examined at bedside. Very poor historian.  No vomiting, agitation, fever reported.   Objective: Vitals:   10/26/19 2130 10/26/19 2240 10/27/19 0500 10/27/19 0532  BP: 93/76 (!) 109/91  99/78  Pulse: 75 71  64  Resp:      Temp: 98.2 F (36.8 C)   97.6 F (36.4 C)  TempSrc: Oral   Oral  SpO2: 97%   99%  Weight:   64.9 kg   Height:        Intake/Output Summary (Last 24 hours) at 10/27/2019 0746 Last data filed at 10/27/2019 0548 Gross per 24 hour  Intake 1440 ml  Output 685 ml  Net 755 ml   Filed Weights   10/20/19 0314 10/24/19 0524 10/27/19 0500  Weight: 87 kg 70.8 kg 64.9 kg    Examination: General exam: No acute distress.  Does not participate in conversation much. Respiratory system: Bilateral decreased breath sounds at bases  cardiovascular system:Rate controlled, S1-S2 heard gastrointestinal system:Abdomen is nondistended, soft and nontender. Normal bowel sounds heard. Extremities: No cyanosis, edema    Data Reviewed: I have personally reviewed following labs and imaging studies  CBC: Recent Labs  Lab 10/24/19 0256  WBC 8.2  HGB 14.5  HCT 43.2  MCV 91.5  PLT 243   Basic Metabolic Panel: Recent Labs  Lab 10/24/19 0256  NA 140  K 4.1  CL 104  CO2 25  GLUCOSE 90  BUN 22*  CREATININE 1.13  CALCIUM 9.5   GFR: Estimated Creatinine Clearance: 71.8 mL/min (by C-G formula based on SCr of 1.13 mg/dL). Liver Function Tests: No results for input(s): AST,  ALT, ALKPHOS, BILITOT, PROT, ALBUMIN in the last 168 hours. No results for input(s): LIPASE, AMYLASE in the last 168 hours. No results for input(s): AMMONIA in the last 168 hours. Coagulation Profile: No results for input(s): INR, PROTIME in the last 168 hours. Cardiac Enzymes: No results for input(s): CKTOTAL, CKMB, CKMBINDEX, TROPONINI in the last 168 hours. BNP (last 3 results) No results for input(s): PROBNP in the last 8760 hours. HbA1C: No results for input(s): HGBA1C in the last 72 hours. CBG: No results for input(s): GLUCAP in the last 168 hours. Lipid Profile: No results for input(s): CHOL, HDL, LDLCALC, TRIG, CHOLHDL, LDLDIRECT in the last 72 hours. Thyroid Function Tests: No results for input(s): TSH, T4TOTAL, FREET4, T3FREE, THYROIDAB in the last 72 hours. Anemia Panel: No results for input(s): VITAMINB12, FOLATE, FERRITIN, TIBC, IRON, RETICCTPCT in the last 72 hours. Sepsis Labs: No results for input(s): PROCALCITON, LATICACIDVEN in the last 168 hours.  No results found for this or any previous visit (from the past 240 hour(s)).       Radiology Studies: No results found.      Scheduled Meds:   stroke: mapping our early stages of recovery book   Does not apply Once   aspirin  325 mg Oral Daily   atorvastatin  20 mg Oral q1800   diclofenac sodium  2 g Topical TID AC & HS   enoxaparin (LOVENOX) injection  40 mg Subcutaneous Q24H  feeding supplement (ENSURE ENLIVE)  237 mL Oral BID BM   folic acid  1 mg Oral Daily   lactulose  20 g Oral BID   metoprolol tartrate  25 mg Oral BID   multivitamin with minerals  1 tablet Oral Daily   omega-3 acid ethyl esters  1 g Oral Daily   pantoprazole  40 mg Oral Daily   sucralfate  1 g Oral TID WC & HS   thiamine  100 mg Oral Daily   Or   thiamine  100 mg Intravenous Daily   Continuous Infusions:        Glade LloydKshitiz Tejal Monroy, MD [facility] 10/27/2019, 7:46 AM

## 2019-10-28 DIAGNOSIS — I6340 Cerebral infarction due to embolism of unspecified cerebral artery: Secondary | ICD-10-CM | POA: Diagnosis not present

## 2019-10-28 NOTE — Progress Notes (Signed)
Patient ID: Tyler [name], male   DOB: 1971-03-19, 47 y.o.   MRN: [id number]  PROGRESS NOTE    Micco Bourbeau  WUJ:[id number] DOB: 06/26/1971 DOA: 07/23/2019 PCP: Patient, No Pcp Per   Brief Narrative:  48 year old male with history of polysubstance abuse including heroine presented to [facility] ER after being found down with a syringe nearby.  He was given Narcan with minimal improvement.  On presentation, he was found to have severe rhabdomyolysis with acute kidney injury and hyperkalemia.  He was started on IV fluids including bicarb drip and transferred to [facility] for further care.  Rhabdomyolysis improved but he had persistence of encephalopathy and was found to have acute bilateral cerebellar infarct.  Neurology was consulted and has subsequently signed off.  PT recommended SNF placement.  Prolonged hospitalization due to placement issues.  Patient is medically stable for discharge to a skilled nursing facility as soon as bed is available.  Assessment & Plan:  Acute bilateral ischemic CVA, nonhemorrhagic -MRI of the brain showed acute CVA -Neurology evaluated the patient and has signed off.  MRA of the head negative. -Ultrasound carotid showed bilateral 1 to 39% ICA stenosis -LDL 83 -A1c 5.1 -Echo showed EF of 60 to 65%.  Lower extremity Dopplers: Negative for DVT -Neurology recommended considering 30-day cardiac event monitoring as outpatient to rule out A. fib if patient neurologically improves -Continue aspirin and statin. -PT recommended SNF.  Awaiting SNF placement.  Currently medically stable for discharge.  Acute metabolic encephalopathy -Multifactorial: Most likely secondary CVA, drug use, uremia, heavy alcohol use -CT of the head and neck was initially negative for any traumatic finding -UDS, Tylenol and salicylate levels were negative. -TSH, folate, B12 levels all within normal limits -EEG: Generalized diffuse slowing but no seizure activity -Had required intermittent  restraints initially during the hospitalization. -Fall precautions - Awaiting SNF placement.Difficult placement  -Waxing and waning mental status.  Currently stable.  Severe rhabdomyolysis/AKI -Resolved with IV fluids.  Renal ultrasound negative.  Off IV fluids  Transaminitis with elevated bilirubin -Probably from alcohol abuse -Right upper quadrant ultrasound showed hepatic steatosis with early cirrhosis with some gallbladder thickening -Resolved.  Alcohol abuse -Advised to quit.  Continue thiamine, multivitamin and folic acid  UTI -Present on admission.  Completed 5 days of Rocephin  Essential hypertension -Stable.  Continue metoprolol.  Severe protein calorie malnutrition -Follow nutrition recommendations  Abdominal pain/back pain -X-ray showed unchanged T11 compression fracture, age-indeterminate L1 fracture -Continue Voltaren gel as needed.  Generalized deconditioning -Likely secondary to alcoholism, CVA, prolonged hospitalization -PT, OT recommending SNF -Patient continues to need several cues when doing self-care tasks as with walking.  Does not appear to be safe to be left alone. -patient unaware of simple items -It seems that patient's brother wanted to take patient home however cannot provide 24-hour supervision   DVT prophylaxis: Lovenox Code Status: Full Family Communication: None at bedside Disposition Plan: SNF once bed is available.  Medically stable for discharge.  Consultants: Neurology  Procedures:  Echo IMPRESSIONS   1. The left ventricle has normal systolic function with an ejection fraction of 60-65%. The cavity size was normal. Left ventricular diastolic Doppler parameters are consistent with impaired relaxation. No evidence of left ventricular regional wall  motion abnormalities. 2. The right ventricle has normal systolic function. The cavity was normal. There is no increase in right ventricular wall thickness. Right ventricular systolic  pressure is normal with an estimated pressure of 21.0 mmHg. 3. The aortic valve is tricuspid. Moderate sclerosis of  the aortic valve. 4. The aorta is normal unless otherwise noted.  Antimicrobials:  Anti-infectives (From admission, onward)   Start     Dose/Rate Route Frequency Ordered Stop   07/25/19 0745  cefTRIAXone (ROCEPHIN) 1 g in sodium chloride 0.9 % 100 mL IVPB     1 g 200 mL/hr over 30 Minutes Intravenous Every 24 hours 07/25/19 0734 07/29/19 1518       Subjective: Patient seen and examined at bedside. Very poor historian.  No fever or agitation reported by nursing staff.   Objective: Vitals:   10/27/19 0532 10/27/19 2205 10/27/19 2206 10/28/19 0506  BP: 99/78 (!) 140/105 (!) 132/99 (!) 127/97  Pulse: 64 62 63 64  Resp:  18  18  Temp: 97.6 F (36.4 C) 98.2 F (36.8 C)  97.9 F (36.6 C)  TempSrc: Oral Oral  Oral  SpO2: 99% 100% 100% 100%  Weight:    65.4 kg  Height:        Intake/Output Summary (Last 24 hours) at 10/28/2019 0737 Last data filed at 10/28/2019 0214 Gross per 24 hour  Intake 240 ml  Output -  Net 240 ml   Filed Weights   10/24/19 0524 10/27/19 0500 10/28/19 0506  Weight: 70.8 kg 64.9 kg 65.4 kg    Examination: General exam: No distress.  Awake but does not participate in conversation much. Respiratory system: Bilateral decreased breath sounds at bases  cardiovascular system:Rate controlled, S1-S2 heard gastrointestinal system:Abdomen is nondistended, soft and nontender. Normal bowel sounds heard. Extremities: No cyanosis, edema    Data Reviewed: I have personally reviewed following labs and imaging studies  CBC: Recent Labs  Lab 10/24/19 0256  WBC 8.2  HGB 14.5  HCT 43.2  MCV 91.5  PLT 093   Basic Metabolic Panel: Recent Labs  Lab 10/24/19 0256  NA 140  K 4.1  CL 104  CO2 25  GLUCOSE 90  BUN 22*  CREATININE 1.13  CALCIUM 9.5   GFR: Estimated Creatinine Clearance: 72.3 mL/min (by C-G formula based on SCr of 1.13  mg/dL). Liver Function Tests: No results for input(s): AST, ALT, ALKPHOS, BILITOT, PROT, ALBUMIN in the last 168 hours. No results for input(s): LIPASE, AMYLASE in the last 168 hours. No results for input(s): AMMONIA in the last 168 hours. Coagulation Profile: No results for input(s): INR, PROTIME in the last 168 hours. Cardiac Enzymes: No results for input(s): CKTOTAL, CKMB, CKMBINDEX, TROPONINI in the last 168 hours. BNP (last 3 results) No results for input(s): PROBNP in the last 8760 hours. HbA1C: No results for input(s): HGBA1C in the last 72 hours. CBG: No results for input(s): GLUCAP in the last 168 hours. Lipid Profile: No results for input(s): CHOL, HDL, LDLCALC, TRIG, CHOLHDL, LDLDIRECT in the last 72 hours. Thyroid Function Tests: No results for input(s): TSH, T4TOTAL, FREET4, T3FREE, THYROIDAB in the last 72 hours. Anemia Panel: No results for input(s): VITAMINB12, FOLATE, FERRITIN, TIBC, IRON, RETICCTPCT in the last 72 hours. Sepsis Labs: No results for input(s): PROCALCITON, LATICACIDVEN in the last 168 hours.  No results found for this or any previous visit (from the past 240 hour(s)).       Radiology Studies: No results found.      Scheduled Meds: .  stroke: mapping our early stages of recovery book   Does not apply Once  . aspirin  325 mg Oral Daily  . atorvastatin  20 mg Oral q1800  . diclofenac sodium  2 g Topical TID AC & HS  .  enoxaparin (LOVENOX) injection  40 mg Subcutaneous Q24H  . feeding supplement (ENSURE ENLIVE)  237 mL Oral BID BM  . folic acid  1 mg Oral Daily  . lactulose  20 g Oral BID  . metoprolol tartrate  25 mg Oral BID  . multivitamin with minerals  1 tablet Oral Daily  . omega-3 acid ethyl esters  1 g Oral Daily  . pantoprazole  40 mg Oral Daily  . sucralfate  1 g Oral TID WC & HS  . thiamine  100 mg Oral Daily   Or  . thiamine  100 mg Intravenous Daily   Continuous Infusions:        Glade Lloyd, MD [facility] 10/28/2019, 7:37 AM

## 2019-10-29 DIAGNOSIS — I6340 Cerebral infarction due to embolism of unspecified cerebral artery: Secondary | ICD-10-CM | POA: Diagnosis not present

## 2019-10-29 NOTE — Progress Notes (Addendum)
Occupational Therapy Treatment Patient Details Name: Tyler [name] MRN: [id number] DOB: [birth year] Today's Date: 10/29/2019    History of present illness 48 y/o male transferred from [place] for unresponsiveness, likely secondary to substance abuse. Pt with rhabdomyolysis and AKI. EEG revealed moderate diffuse encephalopathy. PMH includes polysubstance abuse and HTN. Imaging show acute bialteral cerebellar infarcts.    OT comments  Pt received sleeping in bed upon OT arrival, pt pleasantly awoke to therapist and agreeable to OT session. Session focused on visual assessment. Pt progressed to the EOB independently.  Pt demonstrated difficulty with visual perceptual skills in addition to visual motor skills (see vision section) impacting his safety and independence with ADL/IADL and functional mobility. He demonstrates cognitive limitations (see cognition section), difficult to determine vision vs cognition. For example, pt prompted to fill in numbers on a clock, he wrote '12' in all spots, therapist showed him and educated him on correct image of clock, pt replied "now you got me all confused".  Pt will continue to benefit from skilled OT services to maximize safety and independence with ADL/IADL and functional mobility. Will continue to follow acutely and progress as tolerated.    Follow Up Recommendations  SNF;Supervision/Assistance - 24 hour    Equipment Recommendations  None recommended by OT    Recommendations for Other Services Speech consult    Precautions / Restrictions Precautions Precautions: Fall Precaution Comments: suspect impaired vision Restrictions Weight Bearing Restrictions: No       Mobility Bed Mobility Overal bed mobility: Modified Independent                Transfers                      Balance Overall balance assessment: Needs assistance Sitting-balance support: Feet supported Sitting balance-Leahy Scale: Normal Sitting balance -  Comments: able to sit unsupport with no LOB                                   ADL either performed or assessed with clinical judgement   ADL                                               Vision   Vision Assessment?: Yes Eye Alignment: Within Functional Limits Ocular Range of Motion: Within Functional Limits Tracking/Visual Pursuits: Decreased smoothness of horizontal tracking;Requires cues, head turns, or add eye shifts to track;Unable to hold eye position out of midline Saccades: Additional eye shifts occurred during testing;Additional head turns occurred during testing;Decreased speed of saccadic movement Convergence: Impaired (comment)(Left eye does not converge with R eye;R covered, L converges) Visual Fields: Impaired-to be further tested in functional context(difficult to distinguish) Additional Comments: pt demonstrated limitations with visual memory, visual spatial skills, and pattern recognition;when prompted to fill in remaining numbers on a clock, pt wrote in '12' in all aspects;when shown correct analog clock pt stated "now you got me more confused" unsure if pt is familiar with this clock;pt demonstrated organized scanning with vowel and number cancellation, missed items in Left upper quadrant;able to detect movement in left vs right field of vision, when both items moved pt stated "left";   Perception     Praxis      Cognition Arousal/Alertness: Awake/alert Behavior During Therapy: WFL for tasks assessed/performed  Overall Cognitive Status: Impaired/Different from baseline Area of Impairment: Attention;Memory;Following commands;Safety/judgement;Problem solving;Awareness                   Current Attention Level: Sustained Memory: Decreased recall of precautions;Decreased short-term memory Following Commands: Follows one step commands with increased time;Follows one step commands consistently;Follows multi-step commands  inconsistently Safety/Judgement: Decreased awareness of safety;Decreased awareness of deficits Awareness: Emergent Problem Solving: Slow processing;Difficulty sequencing;Requires verbal cues;Requires tactile cues General Comments: pt demonstrates difficulty with visual processing skills, pt requires consistent vc during visual assessment, demonstrates decreased short term memory and problem solving skills        Exercises     Shoulder Instructions       General Comments vss    Pertinent Vitals/ Pain       Pain Assessment: No/denies pain  Home Living                                          Prior Functioning/Environment              Frequency  Min 1X/week        Progress Toward Goals  OT Goals(current goals can now be found in the care plan section)  Progress towards OT goals: Progressing toward goals  Acute Rehab OT Goals Patient Stated Goal: to get out of here OT Goal Formulation: With patient Time For Goal Achievement: 11/12/19 Potential to Achieve Goals: Good ADL Goals Pt Will Perform Grooming: Independently;standing Pt Will Perform Upper Body Bathing: Independently;sitting Pt Will Perform Lower Body Bathing: Independently;sit to/from stand Pt Will Perform Upper Body Dressing: Independently;sitting Pt Will Perform Lower Body Dressing: Independently;sit to/from stand Pt Will Transfer to Toilet: with modified independence;regular height toilet Pt Will Perform Toileting - Clothing Manipulation and hygiene: Independently;sit to/from stand Pt Will Perform Tub/Shower Transfer: Independently;shower seat;ambulating;grab bars Additional ADL Goal #1: pt will identify 3/3 ADL/toiletry items and retrieve them in prep for selfcare Additional ADL Goal #2: Pt will complete trail making task with <3 VC's for safe and successful completion Additional ADL Goal #3: Pt will complete 3 step task with <3 sequencing cues  Plan Discharge plan remains appropriate     Co-evaluation                 AM-PAC OT "6 Clicks" Daily Activity     Outcome Measure   Help from another person eating meals?: None Help from another person taking care of personal grooming?: A Little Help from another person toileting, which includes using toliet, bedpan, or urinal?: A Little Help from another person bathing (including washing, rinsing, drying)?: A Little Help from another person to put on and taking off regular upper body clothing?: A Little Help from another person to put on and taking off regular lower body clothing?: A Little 6 Click Score: 19    End of Session    OT Visit Diagnosis: Muscle weakness (generalized) (M62.81);Unsteadiness on feet (R26.81)   Activity Tolerance Patient tolerated treatment well   Patient Left in bed;with call bell/phone within reach;with chair alarm set   Nurse Communication Mobility status        Time: 1532-1600 OT Time Calculation (min): 28 min  Charges: OT General Charges $OT Visit: 1 Visit OT Treatments $Self Care/Home Management : 23-37 mins  Diona Browner OTR/L Acute Rehabilitation Services Office: [phone]    Rebeca Alert 10/29/2019, 4:03 PM

## 2019-10-29 NOTE — Progress Notes (Signed)
Patient ID: Tyler [name], male   DOB: 1971-03-19, 47 y.o.   MRN: [id number]  PROGRESS NOTE    Tyler [name]  WUJ:[id number] DOB: 06/26/1971 DOA: 07/23/2019 PCP: Patient, No Pcp Per   Brief Narrative:  48 year old male with history of polysubstance abuse including heroine presented to [facility] ER after being found down with a syringe nearby.  He was given Narcan with minimal improvement.  On presentation, he was found to have severe rhabdomyolysis with acute kidney injury and hyperkalemia.  He was started on IV fluids including bicarb drip and transferred to [facility] for further care.  Rhabdomyolysis improved but he had persistence of encephalopathy and was found to have acute bilateral cerebellar infarct.  Neurology was consulted and has subsequently signed off.  PT recommended SNF placement.  Prolonged hospitalization due to placement issues.  Patient is medically stable for discharge to a skilled nursing facility as soon as bed is available.  Assessment & Plan:  Acute bilateral ischemic CVA, nonhemorrhagic -MRI of the brain showed acute CVA -Neurology evaluated the patient and has signed off.  MRA of the head negative. -Ultrasound carotid showed bilateral 1 to 39% ICA stenosis -LDL 83 -A1c 5.1 -Echo showed EF of 60 to 65%.  Lower extremity Dopplers: Negative for DVT -Neurology recommended considering 30-day cardiac event monitoring as outpatient to rule out A. fib if patient neurologically improves -Continue aspirin and statin. -PT recommended SNF.  Awaiting SNF placement.  Currently medically stable for discharge.  Acute metabolic encephalopathy -Multifactorial: Most likely secondary CVA, drug use, uremia, heavy alcohol use -CT of the head and neck was initially negative for any traumatic finding -UDS, Tylenol and salicylate levels were negative. -TSH, folate, B12 levels all within normal limits -EEG: Generalized diffuse slowing but no seizure activity -Had required intermittent  restraints initially during the hospitalization. -Fall precautions - Awaiting SNF placement.Difficult placement  -Waxing and waning mental status.  Currently stable.  Severe rhabdomyolysis/AKI -Resolved with IV fluids.  Renal ultrasound negative.  Off IV fluids  Transaminitis with elevated bilirubin -Probably from alcohol abuse -Right upper quadrant ultrasound showed hepatic steatosis with early cirrhosis with some gallbladder thickening -Resolved.  Alcohol abuse -Advised to quit.  Continue thiamine, multivitamin and folic acid  UTI -Present on admission.  Completed 5 days of Rocephin  Essential hypertension -Stable.  Continue metoprolol.  Severe protein calorie malnutrition -Follow nutrition recommendations  Abdominal pain/back pain -X-ray showed unchanged T11 compression fracture, age-indeterminate L1 fracture -Continue Voltaren gel as needed.  Generalized deconditioning -Likely secondary to alcoholism, CVA, prolonged hospitalization -PT, OT recommending SNF -Patient continues to need several cues when doing self-care tasks as with walking.  Does not appear to be safe to be left alone. -patient unaware of simple items -It seems that patient's brother wanted to take patient home however cannot provide 24-hour supervision   DVT prophylaxis: Lovenox Code Status: Full Family Communication: None at bedside Disposition Plan: SNF once bed is available.  Medically stable for discharge.  Consultants: Neurology  Procedures:  Echo IMPRESSIONS   1. The left ventricle has normal systolic function with an ejection fraction of 60-65%. The cavity size was normal. Left ventricular diastolic Doppler parameters are consistent with impaired relaxation. No evidence of left ventricular regional wall  motion abnormalities. 2. The right ventricle has normal systolic function. The cavity was normal. There is no increase in right ventricular wall thickness. Right ventricular systolic  pressure is normal with an estimated pressure of 21.0 mmHg. 3. The aortic valve is tricuspid. Moderate sclerosis of  the aortic valve. 4. The aorta is normal unless otherwise noted.  Antimicrobials:  Anti-infectives (From admission, onward)   Start     Dose/Rate Route Frequency Ordered Stop   07/25/19 0745  cefTRIAXone (ROCEPHIN) 1 g in sodium chloride 0.9 % 100 mL IVPB     1 g 200 mL/hr over 30 Minutes Intravenous Every 24 hours 07/25/19 0734 07/29/19 1518       Subjective: Patient seen and examined at bedside. Very poor historian.  No fever, vomiting or agitation reported per nursing staff.    Objective: Vitals:   10/28/19 1216 10/28/19 2117 10/29/19 0521 10/29/19 0522  BP: 132/79 105/79  111/79  Pulse: 74 73  66  Resp: 17     Temp: 98 F (36.7 C) 98.5 F (36.9 C)  97.8 F (36.6 C)  TempSrc: Oral Oral  Oral  SpO2: 97% 99%  97%  Weight:   69.9 kg   Height:        Intake/Output Summary (Last 24 hours) at 10/29/2019 0739 Last data filed at 10/29/2019 0524 Gross per 24 hour  Intake 570 ml  Output 600 ml  Net -30 ml   Filed Weights   10/27/19 0500 10/28/19 0506 10/29/19 0521  Weight: 64.9 kg 65.4 kg 69.9 kg    Examination: General exam: No acute distress.  Pleasant.  Awake but hardly participates in conversation. Respiratory system: Bilateral decreased breath sounds at bases  cardiovascular system:Rate controlled, S1-S2 heard gastrointestinal system:Abdomen is nondistended, soft and nontender. Normal bowel sounds heard. Extremities: No cyanosis, edema    Data Reviewed: I have personally reviewed following labs and imaging studies  CBC: Recent Labs  Lab 10/24/19 0256  WBC 8.2  HGB 14.5  HCT 43.2  MCV 91.5  PLT 073   Basic Metabolic Panel: Recent Labs  Lab 10/24/19 0256  NA 140  K 4.1  CL 104  CO2 25  GLUCOSE 90  BUN 22*  CREATININE 1.13  CALCIUM 9.5   GFR: Estimated Creatinine Clearance: 75.7 mL/min (by C-G formula based on SCr of 1.13  mg/dL). Liver Function Tests: No results for input(s): AST, ALT, ALKPHOS, BILITOT, PROT, ALBUMIN in the last 168 hours. No results for input(s): LIPASE, AMYLASE in the last 168 hours. No results for input(s): AMMONIA in the last 168 hours. Coagulation Profile: No results for input(s): INR, PROTIME in the last 168 hours. Cardiac Enzymes: No results for input(s): CKTOTAL, CKMB, CKMBINDEX, TROPONINI in the last 168 hours. BNP (last 3 results) No results for input(s): PROBNP in the last 8760 hours. HbA1C: No results for input(s): HGBA1C in the last 72 hours. CBG: No results for input(s): GLUCAP in the last 168 hours. Lipid Profile: No results for input(s): CHOL, HDL, LDLCALC, TRIG, CHOLHDL, LDLDIRECT in the last 72 hours. Thyroid Function Tests: No results for input(s): TSH, T4TOTAL, FREET4, T3FREE, THYROIDAB in the last 72 hours. Anemia Panel: No results for input(s): VITAMINB12, FOLATE, FERRITIN, TIBC, IRON, RETICCTPCT in the last 72 hours. Sepsis Labs: No results for input(s): PROCALCITON, LATICACIDVEN in the last 168 hours.  No results found for this or any previous visit (from the past 240 hour(s)).       Radiology Studies: No results found.      Scheduled Meds: .  stroke: mapping our early stages of recovery book   Does not apply Once  . aspirin  325 mg Oral Daily  . atorvastatin  20 mg Oral q1800  . diclofenac sodium  2 g Topical TID AC & HS  .  enoxaparin (LOVENOX) injection  40 mg Subcutaneous Q24H  . feeding supplement (ENSURE ENLIVE)  237 mL Oral BID BM  . folic acid  1 mg Oral Daily  . lactulose  20 g Oral BID  . metoprolol tartrate  25 mg Oral BID  . multivitamin with minerals  1 tablet Oral Daily  . omega-3 acid ethyl esters  1 g Oral Daily  . pantoprazole  40 mg Oral Daily  . sucralfate  1 g Oral TID WC & HS  . thiamine  100 mg Oral Daily   Or  . thiamine  100 mg Intravenous Daily   Continuous Infusions:        Tyler Lloyd, MD [facility] 10/29/2019, 7:39 AM

## 2019-10-29 NOTE — Progress Notes (Signed)
Physical Therapy Treatment Patient Details Name: Tyler [name] MRN: [id number] DOB: 1971-01-11 Today's Date: 10/29/2019    History of Present Illness 48 y/o male transferred from [place] for unresponsiveness, likely secondary to substance abuse. Pt with rhabdomyolysis and AKI. EEG revealed moderate diffuse encephalopathy. PMH includes polysubstance abuse and HTN. Imaging show acute bialteral cerebellar infarcts.     PT Comments    Pt ambulating with supervision for mobility however requires max directional verbal cues for navigating the hallways and to return to room. Suspect pt may have vision deficits or apraxia/expressive difficulties as pt unable to consistently properly identify room numbers accurately, stating "17" for "27" or "13" for "33". Attempted to have pt cover one eye and then the other however it didn't help. Pt remains unsafe to care for self and con't to require 24/7 supervision.    Follow Up Recommendations  SNF;Supervision/Assistance - 24 hour     Equipment Recommendations  None recommended by PT    Recommendations for Other Services       Precautions / Restrictions Precautions Precautions: Fall Precaution Comments: suspect impaired vision Restrictions Weight Bearing Restrictions: No    Mobility  Bed Mobility               General bed mobility comments: pt sitting EOB on low bed  Transfers Overall transfer level: Needs assistance Equipment used: None Transfers: Sit to/from Stand Sit to Stand: Supervision         General transfer comment: supervision for safety  Ambulation/Gait Ambulation/Gait assistance: Supervision Gait Distance (Feet): 300 Feet Assistive device: None Gait Pattern/deviations: Step-through pattern;Staggering right Gait velocity: wfl Gait velocity interpretation: >2.62 ft/sec, indicative of community ambulatory General Gait Details: Pt easily distracted, pt with a hard time following multi step directions to  complete DGI   Stairs             Wheelchair Mobility    Modified Rankin (Stroke Patients Only) Modified Rankin (Stroke Patients Only) Pre-Morbid Rankin Score: No symptoms Modified Rankin: Moderate disability     Balance Overall balance assessment: Needs assistance Sitting-balance support: Feet supported Sitting balance-Leahy Scale: Normal Sitting balance - Comments: able to sit unsupport with no LOB   Standing balance support: During functional activity;No upper extremity supported Standing balance-Leahy Scale: Good Standing balance comment: able to maintain balance with min/mod pertebations             High level balance activites: Backward walking;Direction changes High Level Balance Comments: pt able to amb backwards and tandem walk with min guard. Pt with no episode LOB            Cognition Arousal/Alertness: Awake/alert Behavior During Therapy: Impulsive;WFL for tasks assessed/performed Overall Cognitive Status: Impaired/Different from baseline Area of Impairment: Orientation;Attention;Memory;Following commands;Safety/judgement;Awareness;Problem solving                 Orientation Level: Time;Situation Current Attention Level: Sustained Memory: Decreased recall of precautions;Decreased short-term memory Following Commands: Follows one step commands with increased time;Follows one step commands consistently;Follows multi-step commands inconsistently Safety/Judgement: Decreased awareness of safety;Decreased awareness of deficits Awareness: Emergent Problem Solving: Slow processing;Difficulty sequencing;Requires verbal cues;Requires tactile cues General Comments: pt chewing on a piece of paper, RN provided pt with gum. Unsure if pt with impaired vision, apraxia, or expressive aphasia. Pt stating "13" when seeing "33", or "17" when it's "27". pt would then get a couple of room numbers correct and then would revert to not being able to read them. pt 50%  accurate when reading signs, able  to read "caution" but not "Exit" pt spelled exit appropriately however disaggreed that it spelled "Exit"      Exercises      General Comments General comments (skin integrity, edema, etc.): VSS      Pertinent Vitals/Pain Pain Assessment: No/denies pain    Home Living                      Prior Function            PT Goals (current goals can now be found in the care plan section) Progress towards PT goals: Progressing toward goals    Frequency    Min 1X/week      PT Plan Current plan remains appropriate    Co-evaluation              AM-PAC PT "6 Clicks" Mobility   Outcome Measure  Help needed turning from your back to your side while in a flat bed without using bedrails?: None Help needed moving from lying on your back to sitting on the side of a flat bed without using bedrails?: None Help needed moving to and from a bed to a chair (including a wheelchair)?: None Help needed standing up from a chair using your arms (e.g., wheelchair or bedside chair)?: None Help needed to walk in hospital room?: None Help needed climbing 3-5 steps with a railing? : A Little 6 Click Score: 23    End of Session Equipment Utilized During Treatment: Gait belt Activity Tolerance: Patient tolerated treatment well Patient left: with call bell/phone within reach;in chair;with chair alarm set Nurse Communication: Mobility status PT Visit Diagnosis: Unsteadiness on feet (R26.81);Difficulty in walking, not elsewhere classified (R26.2);Other symptoms and signs involving the nervous system (I94.854)     Time: 6270-3500 PT Time Calculation (min) (ACUTE ONLY): 22 min  Charges:  $Gait Training: 8-22 mins                     Tyler [name], PT, DPT Acute Rehabilitation Services Pager #: [phone] Office #: [phone]    Tyler [name] 10/29/2019, 10:31 AM

## 2019-10-30 DIAGNOSIS — I6340 Cerebral infarction due to embolism of unspecified cerebral artery: Secondary | ICD-10-CM | POA: Diagnosis not present

## 2019-10-30 DIAGNOSIS — G9341 Metabolic encephalopathy: Secondary | ICD-10-CM | POA: Diagnosis not present

## 2019-10-30 NOTE — Progress Notes (Signed)
Nutrition Follow-up  RD working remotely.  DOCUMENTATION CODES:   Severe malnutrition in context of acute illness/injury  INTERVENTION:   -Continue MVI with minerals daily -ContinueMagic cup TID with meals, each supplement provides 290 kcal and 9 grams of protein -ContinueEnsure Enlive po BID, each supplement provides 350 kcal and 20 grams of protein  NUTRITION DIAGNOSIS:   Severe Malnutrition related to acute illness(acute toxic/metabolic encephalopathy) as evidenced by moderate fat depletion, severe fat depletion, moderate muscle depletion, severe muscle depletion, energy intake < or equal to 50% for > or equal to 5 days, percent weight loss.  Ongoing  GOAL:   Patient will meet greater than or equal to 90% of their needs  Progressing   MONITOR:   PO intake, Supplement acceptance, Labs, Weight trends, Skin, I & O's  REASON FOR ASSESSMENT:   LOS    ASSESSMENT:   Tyler [name] is a 48 y.o. male with medical history significant of polysubstance abuse including heroin.  Brought in to [facility] ED just before MN after being found down by friend last evening with nearby syringe.  Reviewed I/O's: +320 ml x 24 hours and +2 L since 10/16/19  UOP: 400 lm x 24 hours  Pt remains with good appetite. Noted meal completion 50-100%. He continues to take Ensure supplements well per [facility].  Reviewed wt hx; noted pt has experienced a 10.7% wt gain over the past month, which is significant for time frame, and favorable given pt's malnutrition.  Medications reviewed and include folic acid, lactulose, MVI, and thiamine.  Pt continues to await SNF placement for discharge.   Labs reviewed: CBGS: 96-106.  Diet Order:   Diet Order            DIET DYS 3 Room service appropriate? No; Fluid consistency: Thin  Diet effective now              EDUCATION NEEDS:   Not appropriate for education at this time  Skin:  Skin Assessment: Reviewed RN Assessment  Last BM:  10/29/19  Height:    Ht Readings from Last 1 Encounters:  08/09/19 5\' 8"  (1.727 m)    Weight:   Wt Readings from Last 1 Encounters:  10/30/19 73.9 kg    Ideal Body Weight:  70 kg  BMI:  Body mass index is 24.78 kg/m.  Estimated Nutritional Needs:   Kcal:  2000-2200  Protein:  105-120 grams  Fluid:  > 2 L    Tyler [name] A. Jimmye Norman, RD, LDN, [place] Registered Dietitian II Certified Diabetes Care and Education Specialist Pager: [phone] After hours Pager: [phone]

## 2019-10-30 NOTE — Progress Notes (Signed)
  Speech Language Pathology Treatment: Cognitive-Linquistic  Patient Details Name: Tyler [name] MRN: [id number] DOB: 1971-11-14 Today's Date: 10/30/2019 Time: 4097-3532 SLP Time Calculation (min) (ACUTE ONLY): 20 min  Assessment / Plan / Recommendation Clinical Impression  Pt seen at bedside for skilled ST intervention targeting goals for improved orientation, attention, and basic problem solving. Pt continues to be significantly confused, but his attention was less fleeting today. He was able to attend to task while discussing current place and time. Pt was again oriented to only the season. Inaccurate for month, year, day, date, and place. Pt indicated he was "analyzing that saw mill, and going to take it down" upon arrival of SLP. Pt not oriented to place, indicating "[place]" as current location. Current correct information was discussed with pt, then SLP left the room to get paper to facilitate orientation. Upon my return just moments later, pt was unable to provide correct information we had just discussed. Orienting information was posted in several places in pt's room to maximize success with recall of this information. Pt exhibits good immediate recall of 3 words, but neither recalled nor recognized any of the 3 words a few minutes later. Better sustained attention today. Continued significant impairment in orientation and functional recall.     HPI HPI: 48yo male admitted 07/23/2019 afater being found down with syringe nearby. PMH: polysubstance abuse including heroin. MRI - patchy acute bilateral cerebral and cerebellar infarcts, right temporal lobe encephalomalacia and chronic cerebral white matter disease. 9th grade education, worked "odd jobs". No baseline deficits per daughter      SLP Plan  Continue with current plan of care       Recommendations   24 hour supervision/SNF                Follow up Recommendations: Skilled Nursing facility;24 hour  supervision/assistance SLP Visit Diagnosis: Cognitive communication deficit (D92.426) Plan: Continue with current plan of care       [place], [facility], [place] Speech Language Pathologist Office: [phone] Pager: [phone]  Shonna Chock 10/30/2019, 3:07 PM

## 2019-10-30 NOTE — Progress Notes (Signed)
Patient ID: Tyler [name], male   DOB: 1971-03-19, 47 y.o.   MRN: [id number]  PROGRESS NOTE    Tyler [name]  WUJ:[id number] DOB: 06/26/1971 DOA: 07/23/2019 PCP: Patient, No Pcp Per   Brief Narrative:  48 year old male with history of polysubstance abuse including heroine presented to [facility] ER after being found down with a syringe nearby.  He was given Narcan with minimal improvement.  On presentation, he was found to have severe rhabdomyolysis with acute kidney injury and hyperkalemia.  He was started on IV fluids including bicarb drip and transferred to [facility] for further care.  Rhabdomyolysis improved but he had persistence of encephalopathy and was found to have acute bilateral cerebellar infarct.  Neurology was consulted and has subsequently signed off.  PT recommended SNF placement.  Prolonged hospitalization due to placement issues.  Patient is medically stable for discharge to a skilled nursing facility as soon as bed is available.  Assessment & Plan:  Acute bilateral ischemic CVA, nonhemorrhagic -MRI of the brain showed acute CVA -Neurology evaluated the patient and has signed off.  MRA of the head negative. -Ultrasound carotid showed bilateral 1 to 39% ICA stenosis -LDL 83 -A1c 5.1 -Echo showed EF of 60 to 65%.  Lower extremity Dopplers: Negative for DVT -Neurology recommended considering 30-day cardiac event monitoring as outpatient to rule out A. fib if patient neurologically improves -Continue aspirin and statin. -PT recommended SNF.  Awaiting SNF placement.  Currently medically stable for discharge.  Acute metabolic encephalopathy -Multifactorial: Most likely secondary CVA, drug use, uremia, heavy alcohol use -CT of the head and neck was initially negative for any traumatic finding -UDS, Tylenol and salicylate levels were negative. -TSH, folate, B12 levels all within normal limits -EEG: Generalized diffuse slowing but no seizure activity -Had required intermittent  restraints initially during the hospitalization. -Fall precautions - Awaiting SNF placement.Difficult placement  -Waxing and waning mental status.  Currently stable.  Severe rhabdomyolysis/AKI -Resolved with IV fluids.  Renal ultrasound negative.  Off IV fluids  Transaminitis with elevated bilirubin -Probably from alcohol abuse -Right upper quadrant ultrasound showed hepatic steatosis with early cirrhosis with some gallbladder thickening -Resolved.  Alcohol abuse -Advised to quit.  Continue thiamine, multivitamin and folic acid  UTI -Present on admission.  Completed 5 days of Rocephin  Essential hypertension -Stable.  Continue metoprolol.  Severe protein calorie malnutrition -Follow nutrition recommendations  Abdominal pain/back pain -X-ray showed unchanged T11 compression fracture, age-indeterminate L1 fracture -Continue Voltaren gel as needed.  Generalized deconditioning -Likely secondary to alcoholism, CVA, prolonged hospitalization -PT, OT recommending SNF -Patient continues to need several cues when doing self-care tasks as with walking.  Does not appear to be safe to be left alone. -patient unaware of simple items -It seems that patient's brother wanted to take patient home however cannot provide 24-hour supervision   DVT prophylaxis: Lovenox Code Status: Full Family Communication: None at bedside Disposition Plan: SNF once bed is available.  Medically stable for discharge.  Consultants: Neurology  Procedures:  Echo IMPRESSIONS   1. The left ventricle has normal systolic function with an ejection fraction of 60-65%. The cavity size was normal. Left ventricular diastolic Doppler parameters are consistent with impaired relaxation. No evidence of left ventricular regional wall  motion abnormalities. 2. The right ventricle has normal systolic function. The cavity was normal. There is no increase in right ventricular wall thickness. Right ventricular systolic  pressure is normal with an estimated pressure of 21.0 mmHg. 3. The aortic valve is tricuspid. Moderate sclerosis of  the aortic valve. 4. The aorta is normal unless otherwise noted.  Antimicrobials:  Anti-infectives (From admission, onward)   Start     Dose/Rate Route Frequency Ordered Stop   07/25/19 0745  cefTRIAXone (ROCEPHIN) 1 g in sodium chloride 0.9 % 100 mL IVPB     1 g 200 mL/hr over 30 Minutes Intravenous Every 24 hours 07/25/19 0734 07/29/19 1518       Subjective: Patient seen and examined at bedside. Very poor historian.  No new complaints. Objective: Vitals:   10/29/19 0522 10/29/19 1342 10/29/19 2215 10/30/19 0521  BP: 111/79 (!) 131/97 100/75 (!) 125/99  Pulse: 66 75 64 (!) 59  Resp:  18 17 17   Temp: 97.8 F (36.6 C) (!) 97.4 F (36.3 C) 98.4 F (36.9 C) 98.1 F (36.7 C)  TempSrc: Oral Oral Oral Oral  SpO2: 97% 99% 98% 99%  Weight:    73.9 kg  Height:        Intake/Output Summary (Last 24 hours) at 10/30/2019 0750 Last data filed at 10/29/2019 1900 Gross per 24 hour  Intake 720 ml  Output 400 ml  Net 320 ml   Filed Weights   10/28/19 0506 10/29/19 0521 10/30/19 0521  Weight: 65.4 kg 69.9 kg 73.9 kg    Examination: General exam: No distress.  Pleasant.  Awake but hardly participates in conversation. Respiratory system: Bilateral decreased breath sounds at bases  cardiovascular system:Rate controlled, S1-S2 heard gastrointestinal system:Abdomen is nondistended, soft and nontender. Normal bowel sounds heard. Extremities: No cyanosis, edema    Data Reviewed: I have personally reviewed following labs and imaging studies  CBC: Recent Labs  Lab 10/24/19 0256  WBC 8.2  HGB 14.5  HCT 43.2  MCV 91.5  PLT 243   Basic Metabolic Panel: Recent Labs  Lab 10/24/19 0256  NA 140  K 4.1  CL 104  CO2 25  GLUCOSE 90  BUN 22*  CREATININE 1.13  CALCIUM 9.5   GFR: Estimated Creatinine Clearance: 75.7 mL/min (by C-G formula based on SCr of 1.13  mg/dL). Liver Function Tests: No results for input(s): AST, ALT, ALKPHOS, BILITOT, PROT, ALBUMIN in the last 168 hours. No results for input(s): LIPASE, AMYLASE in the last 168 hours. No results for input(s): AMMONIA in the last 168 hours. Coagulation Profile: No results for input(s): INR, PROTIME in the last 168 hours. Cardiac Enzymes: No results for input(s): CKTOTAL, CKMB, CKMBINDEX, TROPONINI in the last 168 hours. BNP (last 3 results) No results for input(s): PROBNP in the last 8760 hours. HbA1C: No results for input(s): HGBA1C in the last 72 hours. CBG: No results for input(s): GLUCAP in the last 168 hours. Lipid Profile: No results for input(s): CHOL, HDL, LDLCALC, TRIG, CHOLHDL, LDLDIRECT in the last 72 hours. Thyroid Function Tests: No results for input(s): TSH, T4TOTAL, FREET4, T3FREE, THYROIDAB in the last 72 hours. Anemia Panel: No results for input(s): VITAMINB12, FOLATE, FERRITIN, TIBC, IRON, RETICCTPCT in the last 72 hours. Sepsis Labs: No results for input(s): PROCALCITON, LATICACIDVEN in the last 168 hours.  No results found for this or any previous visit (from the past 240 hour(s)).       Radiology Studies: No results found.      Scheduled Meds: .  stroke: mapping our early stages of recovery book   Does not apply Once  . aspirin  325 mg Oral Daily  . atorvastatin  20 mg Oral q1800  . diclofenac sodium  2 g Topical TID AC & HS  . enoxaparin (LOVENOX)  injection  40 mg Subcutaneous Q24H  . feeding supplement (ENSURE ENLIVE)  237 mL Oral BID BM  . folic acid  1 mg Oral Daily  . lactulose  20 g Oral BID  . metoprolol tartrate  25 mg Oral BID  . multivitamin with minerals  1 tablet Oral Daily  . omega-3 acid ethyl esters  1 g Oral Daily  . pantoprazole  40 mg Oral Daily  . sucralfate  1 g Oral TID WC & HS  . thiamine  100 mg Oral Daily   Or  . thiamine  100 mg Intravenous Daily   Continuous Infusions:        Aline August, MD [facility] 10/30/2019, 7:50 AM

## 2019-10-31 DIAGNOSIS — I6340 Cerebral infarction due to embolism of unspecified cerebral artery: Secondary | ICD-10-CM | POA: Diagnosis not present

## 2019-10-31 NOTE — Progress Notes (Signed)
Patient ID: Tyler [name], male   DOB: Apr 08, 1971, 48 y.o.   MRN: [id number]  PROGRESS NOTE    Tyler [name]  IPJ:[id number] DOB: 11-18-1971 DOA: 07/23/2019 PCP: Patient, No Pcp Per   Brief Narrative:  48 year old male with history of polysubstance abuse including heroine presented to [facility] ER after being found down with a syringe nearby.  He was given Narcan with minimal improvement.  On presentation, he was found to have severe rhabdomyolysis with acute kidney injury and hyperkalemia.  He was started on IV fluids including bicarb drip and transferred to [facility] for further care.  Rhabdomyolysis improved but he had persistence of encephalopathy and was found to have acute bilateral cerebellar infarct.  Neurology was consulted and has subsequently signed off.  PT recommended SNF placement.  Prolonged hospitalization due to placement issues.  Patient is medically stable for discharge to a skilled nursing facility as soon as bed is available.  Assessment & Plan:  Acute bilateral ischemic CVA, nonhemorrhagic -MRI of the brain showed acute CVA -Neurology evaluated the patient and has signed off.  MRA of the head negative. -Ultrasound carotid showed bilateral 1 to 39% ICA stenosis -LDL 83 -A1c 5.1 -Echo showed EF of 60 to 65%.  Lower extremity Dopplers: Negative for DVT -Neurology recommended considering 30-day cardiac event monitoring as outpatient to rule out A. fib if patient neurologically improves -Continue aspirin and statin. -PT recommended SNF.  Awaiting SNF placement.  Currently medically stable for discharge.  Acute metabolic encephalopathy -Multifactorial: Most likely secondary CVA, drug use, uremia, heavy alcohol use -CT of the head and neck was initially negative for any traumatic finding -UDS, Tylenol and salicylate levels were negative. -TSH, folate, B12 levels all within normal limits -EEG: Generalized diffuse slowing but no seizure activity -Had required intermittent  restraints initially during the hospitalization. -Fall precautions - Awaiting SNF placement.Difficult placement  -Waxing and waning mental status.  Currently stable.  Severe rhabdomyolysis/AKI -Resolved with IV fluids.  Renal ultrasound negative.  Off IV fluids  Transaminitis with elevated bilirubin -Probably from alcohol abuse -Right upper quadrant ultrasound showed hepatic steatosis with early cirrhosis with some gallbladder thickening -Resolved.  Alcohol abuse -Advised to quit.  Continue thiamine, multivitamin and folic acid  UTI -Present on admission.  Completed 5 days of Rocephin  Essential hypertension -Stable.  Continue metoprolol.  Severe protein calorie malnutrition -Follow nutrition recommendations  Abdominal pain/back pain -X-ray showed unchanged T11 compression fracture, age-indeterminate L1 fracture -Continue Voltaren gel as needed.  Generalized deconditioning -Likely secondary to alcoholism, CVA, prolonged hospitalization -PT, OT recommending SNF -Patient continues to need several cues when doing self-care tasks as with walking.  Does not appear to be safe to be left alone. -patient unaware of simple items -It seems that patient's brother wanted to take patient home however cannot provide 24-hour supervision   DVT prophylaxis: Lovenox Code Status: Full Family Communication: None at bedside Disposition Plan: SNF once bed is available.  Medically stable for discharge.  Consultants: Neurology  Procedures:  Echo IMPRESSIONS   1. The left ventricle has normal systolic function with an ejection fraction of 60-65%. The cavity size was normal. Left ventricular diastolic Doppler parameters are consistent with impaired relaxation. No evidence of left ventricular regional wall  motion abnormalities. 2. The right ventricle has normal systolic function. The cavity was normal. There is no increase in right ventricular wall thickness. Right ventricular systolic  pressure is normal with an estimated pressure of 21.0 mmHg. 3. The aortic valve is tricuspid. Moderate sclerosis of  the aortic valve. 4. The aorta is normal unless otherwise noted.  Antimicrobials:  Anti-infectives (From admission, onward)   Start     Dose/Rate Route Frequency Ordered Stop   07/25/19 0745  cefTRIAXone (ROCEPHIN) 1 g in sodium chloride 0.9 % 100 mL IVPB     1 g 200 mL/hr over 30 Minutes Intravenous Every 24 hours 07/25/19 0734 07/29/19 1518       Subjective: Patient seen and examined at bedside. Very poor historian.  Denies any overnight fever or vomiting. Objective: Vitals:   10/30/19 1417 10/30/19 2114 10/31/19 0506 10/31/19 0510  BP: 132/87 97/75  98/72  Pulse: 78 75  62  Resp: 18 20  19   Temp: 98.3 F (36.8 C) 98.2 F (36.8 C)  98 F (36.7 C)  TempSrc: Oral Oral  Oral  SpO2: 100% 99%  98%  Weight:   78 kg   Height:        Intake/Output Summary (Last 24 hours) at 10/31/2019 0745 Last data filed at 10/31/2019 0509 Gross per 24 hour  Intake 490 ml  Output 975 ml  Net -485 ml   Filed Weights   10/29/19 0521 10/30/19 0521 10/31/19 0506  Weight: 69.9 kg 73.9 kg 78 kg    Examination: General exam: No acute distress.  Pleasant.  Awake but hardly participates in conversation. Respiratory system: Bilateral decreased breath sounds at bases  cardiovascular system:Rate controlled, S1-S2 heard gastrointestinal system:Abdomen is nondistended, soft and nontender. Normal bowel sounds heard. Extremities: No cyanosis, edema    Data Reviewed: I have personally reviewed following labs and imaging studies  CBC: No results for input(s): WBC, NEUTROABS, HGB, HCT, MCV, PLT in the last 168 hours. Basic Metabolic Panel: No results for input(s): NA, K, CL, CO2, GLUCOSE, BUN, CREATININE, CALCIUM, MG, PHOS in the last 168 hours. GFR: Estimated Creatinine Clearance: 75.7 mL/min (by C-G formula based on SCr of 1.13 mg/dL). Liver Function Tests: No results for  input(s): AST, ALT, ALKPHOS, BILITOT, PROT, ALBUMIN in the last 168 hours. No results for input(s): LIPASE, AMYLASE in the last 168 hours. No results for input(s): AMMONIA in the last 168 hours. Coagulation Profile: No results for input(s): INR, PROTIME in the last 168 hours. Cardiac Enzymes: No results for input(s): CKTOTAL, CKMB, CKMBINDEX, TROPONINI in the last 168 hours. BNP (last 3 results) No results for input(s): PROBNP in the last 8760 hours. HbA1C: No results for input(s): HGBA1C in the last 72 hours. CBG: No results for input(s): GLUCAP in the last 168 hours. Lipid Profile: No results for input(s): CHOL, HDL, LDLCALC, TRIG, CHOLHDL, LDLDIRECT in the last 72 hours. Thyroid Function Tests: No results for input(s): TSH, T4TOTAL, FREET4, T3FREE, THYROIDAB in the last 72 hours. Anemia Panel: No results for input(s): VITAMINB12, FOLATE, FERRITIN, TIBC, IRON, RETICCTPCT in the last 72 hours. Sepsis Labs: No results for input(s): PROCALCITON, LATICACIDVEN in the last 168 hours.  No results found for this or any previous visit (from the past 240 hour(s)).       Radiology Studies: No results found.      Scheduled Meds:   stroke: mapping our early stages of recovery book   Does not apply Once   aspirin  325 mg Oral Daily   atorvastatin  20 mg Oral q1800   diclofenac sodium  2 g Topical TID AC & HS   enoxaparin (LOVENOX) injection  40 mg Subcutaneous Q24H   feeding supplement (ENSURE ENLIVE)  237 mL Oral BID BM   folic acid  1  mg Oral Daily   lactulose  20 g Oral BID   metoprolol tartrate  25 mg Oral BID   multivitamin with minerals  1 tablet Oral Daily   omega-3 acid ethyl esters  1 g Oral Daily   pantoprazole  40 mg Oral Daily   sucralfate  1 g Oral TID WC & HS   thiamine  100 mg Oral Daily   Or   thiamine  100 mg Intravenous Daily   Continuous Infusions:        Tyler Lloyd, MD [facility] 10/31/2019, 7:45 AM

## 2019-11-01 DIAGNOSIS — I6340 Cerebral infarction due to embolism of unspecified cerebral artery: Secondary | ICD-10-CM | POA: Diagnosis not present

## 2019-11-01 NOTE — Progress Notes (Signed)
Patient ID: Tyler [name], male   DOB: Apr 08, 1971, 48 y.o.   MRN: [id number]  PROGRESS NOTE    Omar Gayden  IPJ:[id number] DOB: 11-18-1971 DOA: 07/23/2019 PCP: Patient, No Pcp Per   Brief Narrative:  48 year old male with history of polysubstance abuse including heroine presented to [facility] ER after being found down with a syringe nearby.  He was given Narcan with minimal improvement.  On presentation, he was found to have severe rhabdomyolysis with acute kidney injury and hyperkalemia.  He was started on IV fluids including bicarb drip and transferred to [facility] for further care.  Rhabdomyolysis improved but he had persistence of encephalopathy and was found to have acute bilateral cerebellar infarct.  Neurology was consulted and has subsequently signed off.  PT recommended SNF placement.  Prolonged hospitalization due to placement issues.  Patient is medically stable for discharge to a skilled nursing facility as soon as bed is available.  Assessment & Plan:  Acute bilateral ischemic CVA, nonhemorrhagic -MRI of the brain showed acute CVA -Neurology evaluated the patient and has signed off.  MRA of the head negative. -Ultrasound carotid showed bilateral 1 to 39% ICA stenosis -LDL 83 -A1c 5.1 -Echo showed EF of 60 to 65%.  Lower extremity Dopplers: Negative for DVT -Neurology recommended considering 30-day cardiac event monitoring as outpatient to rule out A. fib if patient neurologically improves -Continue aspirin and statin. -PT recommended SNF.  Awaiting SNF placement.  Currently medically stable for discharge.  Acute metabolic encephalopathy -Multifactorial: Most likely secondary CVA, drug use, uremia, heavy alcohol use -CT of the head and neck was initially negative for any traumatic finding -UDS, Tylenol and salicylate levels were negative. -TSH, folate, B12 levels all within normal limits -EEG: Generalized diffuse slowing but no seizure activity -Had required intermittent  restraints initially during the hospitalization. -Fall precautions - Awaiting SNF placement.Difficult placement  -Waxing and waning mental status.  Currently stable.  Severe rhabdomyolysis/AKI -Resolved with IV fluids.  Renal ultrasound negative.  Off IV fluids  Transaminitis with elevated bilirubin -Probably from alcohol abuse -Right upper quadrant ultrasound showed hepatic steatosis with early cirrhosis with some gallbladder thickening -Resolved.  Alcohol abuse -Advised to quit.  Continue thiamine, multivitamin and folic acid  UTI -Present on admission.  Completed 5 days of Rocephin  Essential hypertension -Stable.  Continue metoprolol.  Severe protein calorie malnutrition -Follow nutrition recommendations  Abdominal pain/back pain -X-ray showed unchanged T11 compression fracture, age-indeterminate L1 fracture -Continue Voltaren gel as needed.  Generalized deconditioning -Likely secondary to alcoholism, CVA, prolonged hospitalization -PT, OT recommending SNF -Patient continues to need several cues when doing self-care tasks as with walking.  Does not appear to be safe to be left alone. -patient unaware of simple items -It seems that patient's brother wanted to take patient home however cannot provide 24-hour supervision   DVT prophylaxis: Lovenox Code Status: Full Family Communication: None at bedside Disposition Plan: SNF once bed is available.  Medically stable for discharge.  Consultants: Neurology  Procedures:  Echo IMPRESSIONS   1. The left ventricle has normal systolic function with an ejection fraction of 60-65%. The cavity size was normal. Left ventricular diastolic Doppler parameters are consistent with impaired relaxation. No evidence of left ventricular regional wall  motion abnormalities. 2. The right ventricle has normal systolic function. The cavity was normal. There is no increase in right ventricular wall thickness. Right ventricular systolic  pressure is normal with an estimated pressure of 21.0 mmHg. 3. The aortic valve is tricuspid. Moderate sclerosis of  the aortic valve. 4. The aorta is normal unless otherwise noted.  Antimicrobials:  Anti-infectives (From admission, onward)   Start     Dose/Rate Route Frequency Ordered Stop   07/25/19 0745  cefTRIAXone (ROCEPHIN) 1 g in sodium chloride 0.9 % 100 mL IVPB     1 g 200 mL/hr over 30 Minutes Intravenous Every 24 hours 07/25/19 0734 07/29/19 1518       Subjective: Patient seen and examined at bedside. Very poor historian.  No new complaints.  Objective: Vitals:   10/31/19 0510 10/31/19 1306 10/31/19 2125 11/01/19 0518  BP: 98/72 (!) 148/104 115/72 125/66  Pulse: 62 69 63 70  Resp: 19 18 15 17   Temp: 98 F (36.7 C) 97.7 F (36.5 C) 98.3 F (36.8 C) 98.2 F (36.8 C)  TempSrc: Oral  Oral Oral  SpO2: 98% 100% 98% 100%  Weight:      Height:       No intake or output data in the 24 hours ending 11/01/19 0827 Filed Weights   10/29/19 0521 10/30/19 0521 10/31/19 0506  Weight: 69.9 kg 73.9 kg 78 kg    Examination: General exam: No distress.  Pleasant.  Awake but hardly participates in conversation. Respiratory system: Bilateral decreased breath sounds at bases  cardiovascular system:Rate controlled, S1-S2 heard gastrointestinal system:Abdomen is nondistended, soft and nontender. Normal bowel sounds heard. Extremities: No cyanosis, edema    Data Reviewed: I have personally reviewed following labs and imaging studies  CBC: No results for input(s): WBC, NEUTROABS, HGB, HCT, MCV, PLT in the last 168 hours. Basic Metabolic Panel: No results for input(s): NA, K, CL, CO2, GLUCOSE, BUN, CREATININE, CALCIUM, MG, PHOS in the last 168 hours. GFR: Estimated Creatinine Clearance: 75.7 mL/min (by C-G formula based on SCr of 1.13 mg/dL). Liver Function Tests: No results for input(s): AST, ALT, ALKPHOS, BILITOT, PROT, ALBUMIN in the last 168 hours. No results for input(s):  LIPASE, AMYLASE in the last 168 hours. No results for input(s): AMMONIA in the last 168 hours. Coagulation Profile: No results for input(s): INR, PROTIME in the last 168 hours. Cardiac Enzymes: No results for input(s): CKTOTAL, CKMB, CKMBINDEX, TROPONINI in the last 168 hours. BNP (last 3 results) No results for input(s): PROBNP in the last 8760 hours. HbA1C: No results for input(s): HGBA1C in the last 72 hours. CBG: No results for input(s): GLUCAP in the last 168 hours. Lipid Profile: No results for input(s): CHOL, HDL, LDLCALC, TRIG, CHOLHDL, LDLDIRECT in the last 72 hours. Thyroid Function Tests: No results for input(s): TSH, T4TOTAL, FREET4, T3FREE, THYROIDAB in the last 72 hours. Anemia Panel: No results for input(s): VITAMINB12, FOLATE, FERRITIN, TIBC, IRON, RETICCTPCT in the last 72 hours. Sepsis Labs: No results for input(s): PROCALCITON, LATICACIDVEN in the last 168 hours.  No results found for this or any previous visit (from the past 240 hour(s)).       Radiology Studies: No results found.      Scheduled Meds:   stroke: mapping our early stages of recovery book   Does not apply Once   aspirin  325 mg Oral Daily   atorvastatin  20 mg Oral q1800   diclofenac sodium  2 g Topical TID AC & HS   enoxaparin (LOVENOX) injection  40 mg Subcutaneous Q24H   feeding supplement (ENSURE ENLIVE)  237 mL Oral BID BM   folic acid  1 mg Oral Daily   lactulose  20 g Oral BID   metoprolol tartrate  25 mg Oral BID  multivitamin with minerals  1 tablet Oral Daily   omega-3 acid ethyl esters  1 g Oral Daily   pantoprazole  40 mg Oral Daily   sucralfate  1 g Oral TID WC & HS   thiamine  100 mg Oral Daily   Or   thiamine  100 mg Intravenous Daily   Continuous Infusions:        Glade Lloyd, MD [facility] 11/01/2019, 8:27 AM

## 2019-11-02 DIAGNOSIS — I6340 Cerebral infarction due to embolism of unspecified cerebral artery: Secondary | ICD-10-CM | POA: Diagnosis not present

## 2019-11-02 NOTE — Progress Notes (Signed)
PROGRESS NOTE    Tyler [name]    Code Status: Full Code  LFY:[id number] DOB: [birth year] DOA: 07/23/2019  PCP: Patient, No Pcp Per    Hospital Summary  48 year old male with history of polysubstance abuse including heroine presented to [facility] ER after being found down with a syringe nearby.  He was given Narcan with minimal improvement.  On presentation, he was found to have severe rhabdomyolysis with acute kidney injury and hyperkalemia.  He was started on IV fluids including bicarb drip and transferred to [facility] for further care.  Rhabdomyolysis improved but he had persistence of encephalopathy and was found to have acute bilateral cerebellar infarct.  Neurology was consulted and has subsequently signed off.  PT recommended SNF placement.  Prolonged hospitalization due to placement issues.  Patient is medically stable for discharge to a skilled nursing facility as soon as bed is available.  I personally took care of this patient roughly 1 month ago and he is currently significantly improved from that time  A & P   Principal Problem:   Cerebral embolism with cerebral infarction Active Problems:   Rhabdomyolysis   Acute kidney failure (HCC)   Acute metabolic encephalopathy   Polysubstance abuse (HCC)   Hyperkalemia   Transaminitis   HTN (hypertension)   Protein-calorie malnutrition, severe  Acute bilateral ischemic CVA, nonhemorrhagic -MRI of the brain showed acute CVA -Neurology evaluated the patient and has signed off.  MRA of the head negative. -Ultrasound carotid showed bilateral 1 to 39% ICA stenosis -LDL 83 -A1c 5.1 -Echo showed EF of 60 to 65%.  Lower extremity Dopplers: Negative for DVT -Neurology recommended considering 30-day cardiac event monitoring as outpatient to rule out A. fib if patient neurologically improves -Continue aspirin and statin. -PT recommended SNF.  Awaiting SNF placement.  Currently medically stable for discharge.  Acute metabolic  encephalopathy Likely had new baseline currently.  Significantly improved from 1 month ago and able to follow commands well today.  AAO x1 -Multifactorial: Most likely secondary CVA, drug use, uremia, heavy alcohol use -CT of the head and neck was initially negative for any traumatic finding -UDS, Tylenol and salicylate levels were negative. -TSH, folate, B12 levels all within normal limits -EEG: Generalized diffuse slowing but no seizure activity -Had required intermittent restraints initially during the hospitalization. -Fall precautions - Awaiting SNF placement.Difficult placement  -Waxing and waning mental status  Severe rhabdomyolysis/AKI -Resolved with IV fluids.  Renal ultrasound negative.  Off IV fluids  Transaminitis with elevated bilirubin -Probably from alcohol abuse -Right upper quadrant ultrasound showed hepatic steatosis with early cirrhosis with some gallbladder thickening -Resolved.  Alcohol abuse -Advised to quit.  Continue thiamine, multivitamin and folic acid  UTI -Present on admission.  Completed 5 days of Rocephin  Essential hypertension -Stable.  Continue metoprolol.  Severe protein calorie malnutrition -Follow nutrition recommendations  Abdominal pain/back pain -X-ray showed unchanged T11 compression fracture, age-indeterminate L1 fracture -Continue Voltaren gel as needed. -Resolved  Generalized deconditioning -Likely secondary to alcoholism, CVA, prolonged hospitalization -PT, OT recommending SNF -Does not appear to be safe to be left alone. -It seems that patient's brother wanted to take patient home however cannot provide 24-hour supervision    DVT prophylaxis: Lovenox Diet: Dysphagia 3 Family Communication: No family at bedside Disposition Plan: Pending bed availability, medically stable  Consultants  Neurology  Procedures  Echo  Antibiotics   Anti-infectives (From admission, onward)   Start     Dose/Rate Route Frequency  Ordered Stop   07/25/19 0745  cefTRIAXone (ROCEPHIN) 1 g in sodium chloride 0.9 % 100 mL IVPB     1 g 200 mL/hr over 30 Minutes Intravenous Every 24 hours 07/25/19 0734 07/29/19 1518           Subjective   Patient seen and examined at bedside in no acute distress and resting comfortably. No acute events overnight. Denies any acute complaints at this time. Ambulating with assistance. Tolerating diet well.   Objective   Vitals:   11/01/19 1358 11/01/19 2104 11/02/19 0546 11/02/19 1415  BP: (!) 144/111 125/86 98/73 (!) 119/95  Pulse: 74 70 66 77  Resp: 20 18 18 20   Temp: 98.1 F (36.7 C) 98 F (36.7 C) 97.9 F (36.6 C) 98.1 F (36.7 C)  TempSrc: Oral Oral Oral Oral  SpO2: 100% 99% 100% 97%  Weight:   80.3 kg   Height:        Intake/Output Summary (Last 24 hours) at 11/02/2019 1527 Last data filed at 11/02/2019 1300 Gross per 24 hour  Intake 1440 ml  Output -  Net 1440 ml   Filed Weights   10/30/19 0521 10/31/19 0506 11/02/19 0546  Weight: 73.9 kg 78 kg 80.3 kg    Examination:  Physical Exam Vitals signs and nursing note reviewed.  Constitutional:      Appearance: Normal appearance.  HENT:     Head: Normocephalic and atraumatic.     Nose: Nose normal.     Mouth/Throat:     Mouth: Mucous membranes are moist.  Eyes:     Extraocular Movements: Extraocular movements intact.  Neck:     Musculoskeletal: Normal range of motion. No neck rigidity.  Cardiovascular:     Rate and Rhythm: Normal rate and regular rhythm.  Pulmonary:     Effort: Pulmonary effort is normal.     Breath sounds: Normal breath sounds.  Abdominal:     General: Abdomen is flat.     Palpations: Abdomen is soft.  Musculoskeletal: Normal range of motion.        General: No swelling.  Neurological:     Mental Status: He is alert. Mental status is at baseline.     Comments: AAO x1-suspect this is his new baseline Follows commands well today  Psychiatric:        Mood and Affect: Mood  normal.     Data Reviewed: I have personally reviewed following labs and imaging studies  CBC: No results for input(s): WBC, NEUTROABS, HGB, HCT, MCV, PLT in the last 168 hours. Basic Metabolic Panel: No results for input(s): NA, K, CL, CO2, GLUCOSE, BUN, CREATININE, CALCIUM, MG, PHOS in the last 168 hours. GFR: Estimated Creatinine Clearance: 75.7 mL/min (by C-G formula based on SCr of 1.13 mg/dL). Liver Function Tests: No results for input(s): AST, ALT, ALKPHOS, BILITOT, PROT, ALBUMIN in the last 168 hours. No results for input(s): LIPASE, AMYLASE in the last 168 hours. No results for input(s): AMMONIA in the last 168 hours. Coagulation Profile: No results for input(s): INR, PROTIME in the last 168 hours. Cardiac Enzymes: No results for input(s): CKTOTAL, CKMB, CKMBINDEX, TROPONINI in the last 168 hours. BNP (last 3 results) No results for input(s): PROBNP in the last 8760 hours. HbA1C: No results for input(s): HGBA1C in the last 72 hours. CBG: No results for input(s): GLUCAP in the last 168 hours. Lipid Profile: No results for input(s): CHOL, HDL, LDLCALC, TRIG, CHOLHDL, LDLDIRECT in the last 72 hours. Thyroid Function Tests: No results for input(s): TSH, T4TOTAL, FREET4, T3FREE, THYROIDAB  in the last 72 hours. Anemia Panel: No results for input(s): VITAMINB12, FOLATE, FERRITIN, TIBC, IRON, RETICCTPCT in the last 72 hours. Sepsis Labs: No results for input(s): PROCALCITON, LATICACIDVEN in the last 168 hours.  No results found for this or any previous visit (from the past 240 hour(s)).       Radiology Studies: No results found.      Scheduled Meds: .  stroke: mapping our early stages of recovery book   Does not apply Once  . aspirin  325 mg Oral Daily  . atorvastatin  20 mg Oral q1800  . diclofenac sodium  2 g Topical TID AC & HS  . enoxaparin (LOVENOX) injection  40 mg Subcutaneous Q24H  . feeding supplement (ENSURE ENLIVE)  237 mL Oral BID BM  . folic acid   1 mg Oral Daily  . lactulose  20 g Oral BID  . metoprolol tartrate  25 mg Oral BID  . multivitamin with minerals  1 tablet Oral Daily  . omega-3 acid ethyl esters  1 g Oral Daily  . pantoprazole  40 mg Oral Daily  . sucralfate  1 g Oral TID WC & HS  . thiamine  100 mg Oral Daily   Or  . thiamine  100 mg Intravenous Daily   Continuous Infusions:   LOS: 102 days    Time spent: 15 minutes with over 50% of the time coordinating the patient's care    Jae DireJared E Ricketta Colantonio, DO [facility] Pager [phone]  If 7PM-7AM, please contact night-coverage [email or web address] Password [email or web address] 11/02/2019, 3:27 PM

## 2019-11-02 NOTE — TOC Progression Note (Signed)
Transition of Care [facility]) - Progression Note    Patient Details  Name: Tyler [name] MRN: [id number] Date of Birth: 08/20/1971  Transition of Care [facility]) CM/SW [place], LCSW Phone Number: 11/02/2019, 5:23 PM  Clinical Narrative:    CSW sent updated referral to [place], [place], [place], and [facility] for review. Barrier continues to be history of substance use and Medicaid.    Expected Discharge Plan: Skilled Nursing Facility Barriers to Discharge: Active Substance Use - Placement, SNF Pending payor source - LOG, SNF Pending bed offer  Expected Discharge Plan and Services Expected Discharge Plan: [place]   Discharge Planning Services: CM Consult, Other - See comment(Pt will need LOG for SNF placement) Post Acute Care Choice: [place] arrangements for the past 2 months: Single Family Home                 DME Arranged: N/A           HH Agency: NA         Social Determinants of Health (SDOH) Interventions    Readmission Risk Interventions No flowsheet data found.

## 2019-11-02 NOTE — Progress Notes (Signed)
   11/02/19 2128  MEWS Score  Resp 18  Pulse Rate 81  BP 108/81  Temp 98.3 F (36.8 C)  SpO2 96 %  O2 Device Room Air  MEWS Score  MEWS RR 0  MEWS Pulse 0  MEWS Systolic 0  MEWS LOC 0  MEWS Temp 0  MEWS Score 0  MEWS Score Color Green  MEWS Assessment  Is this an acute change? No   Lab Results WBC  Date/Time Value Ref Range Status  10/24/2019 02:56 AM 8.2 4.0 - 10.5 K/uL Final  10/07/2019 03:30 AM 6.8 4.0 - 10.5 K/uL Final  10/05/2019 03:01 AM 7.7 4.0 - 10.5 K/uL Final   Neutrophils Relative %  Date/Time Value Ref Range Status  10/07/2019 03:30 AM 56 % Final  10/05/2019 03:01 AM 49 % Final  09/19/2019 03:47 AM 49 % Final   pCO2 arterial  Date/Time Value Ref Range Status  07/23/2019 08:00 PM 32.8 32.0 - 48.0 mmHg Final   Lactic Acid, Venous  Date/Time Value Ref Range Status  07/23/2019 11:00 PM 1.9 0.5 - 1.9 mmol/L Final    Comment:    Performed at [facility], [place] [address], [place], [place] [address]  07/23/2019 07:55 PM 1.8 0.5 - 1.9 mmol/L Final    Comment:    Performed at [facility] [address] [place], [place] [address]   No results found for: PCO2VEN

## 2019-11-03 DIAGNOSIS — I6340 Cerebral infarction due to embolism of unspecified cerebral artery: Secondary | ICD-10-CM | POA: Diagnosis not present

## 2019-11-03 LAB — BASIC METABOLIC PANEL
Anion gap: 13 (ref 5–15)
BUN: 24 mg/dL — ABNORMAL HIGH (ref 6–20)
CO2: 25 mmol/L (ref 22–32)
Calcium: 9.7 mg/dL (ref 8.9–10.3)
Chloride: 104 mmol/L (ref 98–111)
Creatinine, Ser: 1.34 mg/dL — ABNORMAL HIGH (ref 0.61–1.24)
GFR calc Af Amer: >60 mL/min (ref >60)
GFR calc non Af Amer: >60 mL/min (ref >60)
Glucose, Bld: 103 mg/dL — ABNORMAL HIGH (ref 70–99)
Potassium: 4.4 mmol/L (ref 3.5–5.1)
Sodium: 142 mmol/L (ref 135–145)

## 2019-11-03 LAB — CBC
HCT: 49.4 % (ref 39.0–52.0)
Hemoglobin: 16.7 g/dL (ref 13.0–17.0)
MCH: 31.1 pg (ref 26.0–34.0)
MCHC: 33.8 g/dL (ref 30.0–36.0)
MCV: 92 fL (ref 80.0–100.0)
Platelets: 249 10*3/uL (ref 150–400)
RBC: 5.37 MIL/uL (ref 4.22–5.81)
RDW: 11.9 % (ref 11.5–15.5)
WBC: 10.3 10*3/uL (ref 4.0–10.5)
nRBC: 0 % (ref 0.0–0.2)

## 2019-11-03 MED ORDER — DICLOFENAC SODIUM 1 % TD GEL
2.0000 g | Freq: Three times a day (TID) | TRANSDERMAL | Status: DC | PRN
  Filled 2019-11-03: qty 100

## 2019-11-03 NOTE — Plan of Care (Signed)
  Problem: Clinical Measurements: Goal: Ability to maintain clinical measurements within normal limits will improve Outcome: Progressing   Problem: Clinical Measurements: Goal: Ability to maintain clinical measurements within normal limits will improve Outcome: Progressing   Problem: Education: Goal: Knowledge of General Education information will improve Description: Including pain rating scale, medication(s)/side effects and non-pharmacologic comfort measures Outcome: Progressing   Problem: Health Behavior/Discharge Planning: Goal: Ability to manage health-related needs will improve Outcome: Progressing   Problem: Clinical Measurements: Goal: Ability to maintain clinical measurements within normal limits will improve Outcome: Progressing Goal: Will remain free from infection Outcome: Progressing Goal: Diagnostic test results will improve Outcome: Progressing Goal: Respiratory complications will improve Outcome: Progressing Goal: Cardiovascular complication will be avoided Outcome: Progressing   Problem: Nutrition: Goal: Adequate nutrition will be maintained Outcome: Progressing   Problem: Coping: Goal: Level of anxiety will decrease Outcome: Progressing   Problem: Elimination: Goal: Will not experience complications related to bowel motility Outcome: Progressing Goal: Will not experience complications related to urinary retention Outcome: Progressing   Problem: Pain Managment: Goal: General experience of comfort will improve Outcome: Progressing   Problem: Safety: Goal: Ability to remain free from injury will improve Outcome: Progressing   Problem: Skin Integrity: Goal: Risk for impaired skin integrity will decrease Outcome: Progressing   Problem: Health Behavior/Discharge Planning: Goal: Ability to manage health-related needs will improve Outcome: Progressing   Problem: Nutrition: Goal: Risk of aspiration will decrease Outcome: Progressing Goal:  Dietary intake will improve Outcome: Progressing   Problem: Ischemic Stroke/TIA Tissue Perfusion: Goal: Complications of ischemic stroke/TIA will be minimized Outcome: Progressing

## 2019-11-03 NOTE — Plan of Care (Signed)
  Problem: Clinical Measurements: Goal: Ability to maintain clinical measurements within normal limits will improve Outcome: Progressing   Problem: Health Behavior/Discharge Planning: Goal: Ability to manage health-related needs will improve Outcome: Progressing   Problem: Nutrition: Goal: Risk of aspiration will decrease Outcome: Progressing Goal: Dietary intake will improve Outcome: Progressing   Problem: Ischemic Stroke/TIA Tissue Perfusion: Goal: Complications of ischemic stroke/TIA will be minimized Outcome: Progressing   Problem: Clinical Measurements: Goal: Ability to maintain clinical measurements within normal limits will improve Outcome: Progressing   Problem: Clinical Measurements: Goal: Ability to maintain clinical measurements within normal limits will improve Outcome: Progressing Goal: Will remain free from infection Outcome: Progressing Goal: Diagnostic test results will improve Outcome: Progressing Goal: Respiratory complications will improve Outcome: Progressing Goal: Cardiovascular complication will be avoided Outcome: Progressing   Problem: Activity: Goal: Risk for activity intolerance will decrease Outcome: Progressing   Problem: Nutrition: Goal: Adequate nutrition will be maintained Outcome: Progressing   Problem: Coping: Goal: Level of anxiety will decrease Outcome: Progressing   Problem: Elimination: Goal: Will not experience complications related to bowel motility Outcome: Progressing Goal: Will not experience complications related to urinary retention Outcome: Progressing   Problem: Pain Managment: Goal: General experience of comfort will improve Outcome: Progressing   Problem: Safety: Goal: Ability to remain free from injury will improve Outcome: Progressing   Problem: Skin Integrity: Goal: Risk for impaired skin integrity will decrease Outcome: Progressing

## 2019-11-03 NOTE — Progress Notes (Signed)
PROGRESS NOTE    Tyler [name]    Code Status: Full Code  TGG:[id number] DOB: [birth year] DOA: 07/23/2019  PCP: Patient, No Pcp Per    Hospital Summary  48 year old male with history of polysubstance abuse including heroine presented to [facility] ER after being found down with a syringe nearby.  He was given Narcan with minimal improvement.  On presentation, he was found to have severe rhabdomyolysis with acute kidney injury and hyperkalemia.  He was started on IV fluids including bicarb drip and transferred to [facility] for further care.  Rhabdomyolysis improved but he had persistence of encephalopathy and was found to have acute bilateral cerebellar infarct.  Neurology was consulted and has subsequently signed off.  PT recommended SNF placement.  Prolonged hospitalization due to placement issues.  Patient is medically stable for discharge to a skilled nursing facility as soon as bed is available.  I personally took care of this patient roughly 1 month ago and he is currently significantly improved from that time  A & P   Principal Problem:   Cerebral embolism with cerebral infarction Active Problems:   Rhabdomyolysis   Acute kidney failure (HCC)   Acute metabolic encephalopathy   Polysubstance abuse (HCC)   Hyperkalemia   Transaminitis   HTN (hypertension)   Protein-calorie malnutrition, severe  Acute bilateral ischemic CVA, nonhemorrhagic -MRI of the brain showed acute CVA -Neurology evaluated the patient and has signed off.  MRA of the head negative. -Ultrasound carotid showed bilateral 1 to 39% ICA stenosis -LDL 83 -A1c 5.1 -Echo showed EF of 60 to 65%.  Lower extremity Dopplers: Negative for DVT -Neurology recommended considering 30-day cardiac event monitoring as outpatient to rule out A. fib if patient neurologically improves -Continue aspirin and statin. -PT recommended SNF.  Awaiting SNF placement.  Currently medically stable for discharge.  Acute metabolic  encephalopathy Likely had new baseline currently.  Significantly improved from 1 month ago and able to follow commands well today.  AAO x1 -Multifactorial: Most likely secondary CVA, drug use, uremia, heavy alcohol use -CT of the head and neck was initially negative for any traumatic finding -UDS, Tylenol and salicylate levels were negative. -TSH, folate, B12 levels all within normal limits -EEG: Generalized diffuse slowing but no seizure activity -Had required intermittent restraints initially during the hospitalization. -Fall precautions - Awaiting SNF placement.Difficult placement  -Waxing and waning mental status  Severe rhabdomyolysis/AKI -Resolved with IV fluids.  Renal ultrasound negative.  Off IV fluids  Transaminitis with elevated bilirubin -Probably from alcohol abuse -Right upper quadrant ultrasound showed hepatic steatosis with early cirrhosis with some gallbladder thickening -Resolved.  Alcohol abuse -Advised to quit.  Continue thiamine, multivitamin and folic acid  UTI -Present on admission.  Completed 5 days of Rocephin  Essential hypertension -Stable.  Continue metoprolol.  Severe protein calorie malnutrition -Follow nutrition recommendations  Abdominal pain/back pain -X-ray showed unchanged T11 compression fracture, age-indeterminate L1 fracture -Continue Voltaren gel as needed. -Resolved  Generalized deconditioning -Likely secondary to alcoholism, CVA, prolonged hospitalization -PT, OT recommending SNF -Does not appear to be safe to be left alone. -It seems that patient's brother wanted to take patient home however cannot provide 24-hour supervision    DVT prophylaxis: Lovenox Diet: Dysphagia 3 Family Communication: No family at bedside Disposition Plan: Pending bed availability, medically stable  Consultants  Neurology  Procedures  Echo  Antibiotics   Anti-infectives (From admission, onward)   Start     Dose/Rate Route Frequency  Ordered Stop   07/25/19 0745  cefTRIAXone (ROCEPHIN) 1 g in sodium chloride 0.9 % 100 mL IVPB     1 g 200 mL/hr over 30 Minutes Intravenous Every 24 hours 07/25/19 0734 07/29/19 1518           Subjective   Patient seen and examined at bedside no acute distress and resting comfortably.  No events overnight.  Tolerating diet.  Denies any chest pain, shortness of breath, fever, nausea, vomiting, urinary complaints.  Admits to having bowel movement.  Otherwise ROS negative   Objective   Vitals:   11/02/19 2128 11/03/19 0419 11/03/19 0824 11/03/19 1329  BP: 108/81 95/72 116/90 (!) 135/97  Pulse: 81 86 69 78  Resp: 18 18 18 17   Temp: 98.3 F (36.8 C) 98 F (36.7 C) 98 F (36.7 C) (!) 97.4 F (36.3 C)  TempSrc: Oral Oral Oral Oral  SpO2: 96% 98% 100% 98%  Weight:      Height:        Intake/Output Summary (Last 24 hours) at 11/03/2019 1412 Last data filed at 11/03/2019 0900 Gross per 24 hour  Intake 957 ml  Output -  Net 957 ml   Filed Weights   10/30/19 0521 10/31/19 0506 11/02/19 0546  Weight: 73.9 kg 78 kg 80.3 kg    Examination:  Physical Exam Vitals signs and nursing note reviewed.  Constitutional:      Appearance: Normal appearance.  HENT:     Head: Normocephalic and atraumatic.     Nose: Nose normal.     Mouth/Throat:     Mouth: Mucous membranes are moist.  Eyes:     Extraocular Movements: Extraocular movements intact.  Neck:     Musculoskeletal: Normal range of motion. No neck rigidity.  Cardiovascular:     Rate and Rhythm: Normal rate and regular rhythm.  Pulmonary:     Effort: Pulmonary effort is normal.     Breath sounds: Normal breath sounds.  Abdominal:     General: Abdomen is flat.     Palpations: Abdomen is soft.  Musculoskeletal: Normal range of motion.        General: No swelling.  Neurological:     Mental Status: He is alert. Mental status is at baseline.  Psychiatric:        Mood and Affect: Mood normal.        Behavior: Behavior  normal.     Data Reviewed: I have personally reviewed following labs and imaging studies  CBC: Recent Labs  Lab 11/03/19 0836  WBC 10.3  HGB 16.7  HCT 49.4  MCV 92.0  PLT 249   Basic Metabolic Panel: Recent Labs  Lab 11/03/19 0836  NA 142  K 4.4  CL 104  CO2 25  GLUCOSE 103*  BUN 24*  CREATININE 1.34*  CALCIUM 9.7   GFR: Estimated Creatinine Clearance: 63.8 mL/min (A) (by C-G formula based on SCr of 1.34 mg/dL (H)). Liver Function Tests: No results for input(s): AST, ALT, ALKPHOS, BILITOT, PROT, ALBUMIN in the last 168 hours. No results for input(s): LIPASE, AMYLASE in the last 168 hours. No results for input(s): AMMONIA in the last 168 hours. Coagulation Profile: No results for input(s): INR, PROTIME in the last 168 hours. Cardiac Enzymes: No results for input(s): CKTOTAL, CKMB, CKMBINDEX, TROPONINI in the last 168 hours. BNP (last 3 results) No results for input(s): PROBNP in the last 8760 hours. HbA1C: No results for input(s): HGBA1C in the last 72 hours. CBG: No results for input(s): GLUCAP in the last 168 hours. Lipid  Profile: No results for input(s): CHOL, HDL, LDLCALC, TRIG, CHOLHDL, LDLDIRECT in the last 72 hours. Thyroid Function Tests: No results for input(s): TSH, T4TOTAL, FREET4, T3FREE, THYROIDAB in the last 72 hours. Anemia Panel: No results for input(s): VITAMINB12, FOLATE, FERRITIN, TIBC, IRON, RETICCTPCT in the last 72 hours. Sepsis Labs: No results for input(s): PROCALCITON, LATICACIDVEN in the last 168 hours.  No results found for this or any previous visit (from the past 240 hour(s)).       Radiology Studies: No results found.      Scheduled Meds: .  stroke: mapping our early stages of recovery book   Does not apply Once  . aspirin  325 mg Oral Daily  . atorvastatin  20 mg Oral q1800  . diclofenac sodium  2 g Topical TID AC & HS  . enoxaparin (LOVENOX) injection  40 mg Subcutaneous Q24H  . feeding supplement (ENSURE ENLIVE)   237 mL Oral BID BM  . folic acid  1 mg Oral Daily  . lactulose  20 g Oral BID  . metoprolol tartrate  25 mg Oral BID  . multivitamin with minerals  1 tablet Oral Daily  . omega-3 acid ethyl esters  1 g Oral Daily  . pantoprazole  40 mg Oral Daily  . sucralfate  1 g Oral TID WC & HS  . thiamine  100 mg Oral Daily   Or  . thiamine  100 mg Intravenous Daily   Continuous Infusions:   LOS: 103 days    Time spent: 15 minutes with over 50% of the time coordinating the patient's care    Jae DireJared E Lynnie Koehler, DO [facility] Pager [phone]  If 7PM-7AM, please contact night-coverage [email or web address] Password [email or web address] 11/03/2019, 2:12 PM

## 2019-11-04 DIAGNOSIS — I6340 Cerebral infarction due to embolism of unspecified cerebral artery: Secondary | ICD-10-CM | POA: Diagnosis not present

## 2019-11-04 LAB — BASIC METABOLIC PANEL
Anion gap: 9 (ref 5–15)
BUN: 22 mg/dL — ABNORMAL HIGH (ref 6–20)
CO2: 23 mmol/L (ref 22–32)
Calcium: 9.2 mg/dL (ref 8.9–10.3)
Chloride: 108 mmol/L (ref 98–111)
Creatinine, Ser: 1.21 mg/dL (ref 0.61–1.24)
GFR calc Af Amer: >60 mL/min (ref >60)
GFR calc non Af Amer: >60 mL/min (ref >60)
Glucose, Bld: 88 mg/dL (ref 70–99)
Potassium: 3.9 mmol/L (ref 3.5–5.1)
Sodium: 140 mmol/L (ref 135–145)

## 2019-11-04 NOTE — Progress Notes (Signed)
PROGRESS NOTE    Tyler [name]    Code Status: Full Code  ZOX:[id number] DOB: 1971-06-20 DOA: 07/23/2019  PCP: Patient, No Pcp Per    Hospital Summary  48 year old male with history of polysubstance abuse including heroine presented to [facility] ER after being found down with a syringe nearby.  He was given Narcan with minimal improvement.  On presentation, he was found to have severe rhabdomyolysis with acute kidney injury and hyperkalemia.  He was started on IV fluids including bicarb drip and transferred to [facility] for further care.  Rhabdomyolysis improved but he had persistence of encephalopathy and was found to have acute bilateral cerebellar infarct.  Neurology was consulted and has subsequently signed off.  PT recommended SNF placement.  Prolonged hospitalization due to placement issues.  Patient is medically stable for discharge to a skilled nursing facility as soon as bed is available.  I personally took care of this patient roughly 1 month ago and he is currently significantly improved from that time  A & P   Principal Problem:   Cerebral embolism with cerebral infarction Active Problems:   Rhabdomyolysis   Acute kidney failure (HCC)   Acute metabolic encephalopathy   Polysubstance abuse (HCC)   Hyperkalemia   Transaminitis   HTN (hypertension)   Protein-calorie malnutrition, severe  Acute bilateral ischemic CVA, nonhemorrhagic -significantly improved -MRI of the brain showed acute CVA -Neurology evaluated the patient and has signed off.  MRA of the head negative. -Ultrasound carotid showed bilateral 1 to 39% ICA stenosis -LDL 83 -A1c 5.1 -Echo showed EF of 60 to 65%.  Lower extremity Dopplers: Negative for DVT -Neurology recommended considering 30-day cardiac event monitoring as outpatient to rule out A. fib if patient neurologically improves -Continue aspirin and statin. -PT recommended SNF.  Awaiting SNF placement.  Currently medically stable for discharge. Will  ask PT to reassess as patient seems to be improving very well.  Acute metabolic encephalopathy Significantly improved from 1 month ago when I saw him and able to follow commands well today as well as conversant.   -Multifactorial: Most likely secondary CVA, drug use, uremia, heavy alcohol use -CT of the head and neck was initially negative for any traumatic finding -UDS, Tylenol and salicylate levels were negative. -TSH, folate, B12 levels all within normal limits -EEG: Generalized diffuse slowing but no seizure activity -Had required intermittent restraints initially during the hospitalization.  Severe rhabdomyolysis/AKI -Resolved with IV fluids.  Renal ultrasound negative.  Off IV fluids  Transaminitis with elevated bilirubin -Probably from alcohol abuse -Right upper quadrant ultrasound showed hepatic steatosis with early cirrhosis with some gallbladder thickening -Resolved.  Alcohol abuse -Advised to quit.  Continue thiamine, multivitamin and folic acid  UTI -Present on admission.  Completed 5 days of Rocephin  Essential hypertension -Stable.  Continue metoprolol.  Severe protein calorie malnutrition -Follow nutrition recommendations  Abdominal pain/back pain -X-ray showed unchanged T11 compression fracture, age-indeterminate L1 fracture -Continue Voltaren gel as needed. -Resolved  Generalized deconditioning -Likely secondary to alcoholism, CVA, prolonged hospitalization -PT, OT recommending SNF, will ask to reassess    DVT prophylaxis: Lovenox Diet: Dysphagia 3 Family Communication: no family at bedside Disposition Plan: Pending PT recommendations, medically stable  Consultants  Neurology  Procedures  Echo  Antibiotics   Anti-infectives (From admission, onward)   Start     Dose/Rate Route Frequency Ordered Stop   07/25/19 0745  cefTRIAXone (ROCEPHIN) 1 g in sodium chloride 0.9 % 100 mL IVPB     1 g 200  mL/hr over 30 Minutes Intravenous Every 24  hours 07/25/19 0734 07/29/19 1518           Subjective   Patient examined at bedside sitting upright. No distress and resting comfortably. He is very frustrated he is still in the hospital but otherwise no other complaints.   Objective   Vitals:   11/03/19 2230 11/04/19 0502 11/04/19 0651 11/04/19 1015  BP: 119/87  90/62 (!) 127/92  Pulse: 68   68  Resp: 16  18   Temp: 97.9 F (36.6 C)  98.3 F (36.8 C)   TempSrc: Oral  Oral   SpO2: 98%     Weight:  81.6 kg    Height:        Intake/Output Summary (Last 24 hours) at 11/04/2019 1134 Last data filed at 11/03/2019 1300 Gross per 24 hour  Intake 480 ml  Output -  Net 480 ml   Filed Weights   10/31/19 0506 11/02/19 0546 11/04/19 0502  Weight: 78 kg 80.3 kg 81.6 kg    Examination:  Physical Exam Vitals signs and nursing note reviewed.  Constitutional:      Appearance: Normal appearance.  HENT:     Head: Normocephalic and atraumatic.     Nose: Nose normal.     Mouth/Throat:     Mouth: Mucous membranes are moist.  Eyes:     Extraocular Movements: Extraocular movements intact.  Neck:     Musculoskeletal: Normal range of motion. No neck rigidity.  Cardiovascular:     Rate and Rhythm: Normal rate and regular rhythm.  Pulmonary:     Effort: Pulmonary effort is normal.     Breath sounds: Normal breath sounds.  Abdominal:     General: Abdomen is flat.     Palpations: Abdomen is soft.  Musculoskeletal: Normal range of motion.        General: No swelling.  Neurological:     Mental Status: He is alert. Mental status is at baseline.  Psychiatric:        Mood and Affect: Mood normal.        Behavior: Behavior normal.        Thought Content: Thought content normal.        Judgment: Judgment normal.     Data Reviewed: I have personally reviewed following labs and imaging studies  CBC: Recent Labs  Lab 11/03/19 0836  WBC 10.3  HGB 16.7  HCT 49.4  MCV 92.0  PLT 595   Basic Metabolic Panel: Recent Labs   Lab 11/03/19 0836 11/04/19 0752  NA 142 140  K 4.4 3.9  CL 104 108  CO2 25 23  GLUCOSE 103* 88  BUN 24* 22*  CREATININE 1.34* 1.21  CALCIUM 9.7 9.2   GFR: Estimated Creatinine Clearance: 70.7 mL/min (by C-G formula based on SCr of 1.21 mg/dL). Liver Function Tests: No results for input(s): AST, ALT, ALKPHOS, BILITOT, PROT, ALBUMIN in the last 168 hours. No results for input(s): LIPASE, AMYLASE in the last 168 hours. No results for input(s): AMMONIA in the last 168 hours. Coagulation Profile: No results for input(s): INR, PROTIME in the last 168 hours. Cardiac Enzymes: No results for input(s): CKTOTAL, CKMB, CKMBINDEX, TROPONINI in the last 168 hours. BNP (last 3 results) No results for input(s): PROBNP in the last 8760 hours. HbA1C: No results for input(s): HGBA1C in the last 72 hours. CBG: No results for input(s): GLUCAP in the last 168 hours. Lipid Profile: No results for input(s): CHOL, HDL, LDLCALC, TRIG, CHOLHDL,  LDLDIRECT in the last 72 hours. Thyroid Function Tests: No results for input(s): TSH, T4TOTAL, FREET4, T3FREE, THYROIDAB in the last 72 hours. Anemia Panel: No results for input(s): VITAMINB12, FOLATE, FERRITIN, TIBC, IRON, RETICCTPCT in the last 72 hours. Sepsis Labs: No results for input(s): PROCALCITON, LATICACIDVEN in the last 168 hours.  No results found for this or any previous visit (from the past 240 hour(s)).       Radiology Studies: No results found.      Scheduled Meds: .  stroke: mapping our early stages of recovery book   Does not apply Once  . aspirin  325 mg Oral Daily  . atorvastatin  20 mg Oral q1800  . enoxaparin (LOVENOX) injection  40 mg Subcutaneous Q24H  . feeding supplement (ENSURE ENLIVE)  237 mL Oral BID BM  . folic acid  1 mg Oral Daily  . lactulose  20 g Oral BID  . metoprolol tartrate  25 mg Oral BID  . multivitamin with minerals  1 tablet Oral Daily  . omega-3 acid ethyl esters  1 g Oral Daily  . pantoprazole  40  mg Oral Daily  . sucralfate  1 g Oral TID WC & HS  . thiamine  100 mg Oral Daily   Or  . thiamine  100 mg Intravenous Daily   Continuous Infusions:   LOS: 104 days    Time spent: 15 minutes with over 50% of the time coordinating the patient's care    Jae DireJared E Mishaal [place], DO [facility] Pager [phone]  If 7PM-7AM, please contact night-coverage [email or web address] Password [email or web address] 11/04/2019, 11:34 AM

## 2019-11-04 NOTE — Plan of Care (Signed)
  Problem: Clinical Measurements: Goal: Respiratory complications will improve Outcome: Progressing Goal: Cardiovascular complication will be avoided Outcome: Progressing   Problem: Activity: Goal: Risk for activity intolerance will decrease Outcome: Progressing   Problem: Nutrition: Goal: Adequate nutrition will be maintained Outcome: Progressing   Problem: Coping: Goal: Level of anxiety will decrease Outcome: Progressing   Problem: Elimination: Goal: Will not experience complications related to bowel motility Outcome: Progressing Goal: Will not experience complications related to urinary retention Outcome: Progressing   Problem: Safety: Goal: Ability to remain free from injury will improve Outcome: Progressing   Problem: Skin Integrity: Goal: Risk for impaired skin integrity will decrease Outcome: Progressing

## 2019-11-05 DIAGNOSIS — R262 Difficulty in walking, not elsewhere classified: Secondary | ICD-10-CM

## 2019-11-05 NOTE — Progress Notes (Addendum)
Physical Therapy Treatment Patient Details Name: Tyler [name] MRN: [id number] DOB: 07-20-1971 Today's Date: 11/05/2019    History of Present Illness 48 y/o male transferred from [place] for unresponsiveness, likely secondary to substance abuse. Pt with rhabdomyolysis and AKI. EEG revealed moderate diffuse encephalopathy. PMH includes polysubstance abuse and HTN. Imaging show acute bialteral cerebellar infarcts.     PT Comments    Pt much improved from vision stand point. Pt able to read signs in hallway properly and appropriately. Pt continues to require re-orientation and max directional verbal cues to complete task. Pt was able to find his room with minimal cuing this date compared to last week where he was dependent. Pt remains to have decreased insight to deficits and safety and is unsafe to return home alone. Con't to recommend ST-SNF upon d/c.    Follow Up Recommendations  SNF;Supervision/Assistance - 24 hour     Equipment Recommendations  None recommended by PT    Recommendations for Other Services       Precautions / Restrictions Precautions Precautions: Fall Restrictions Weight Bearing Restrictions: No    Mobility  Bed Mobility               General bed mobility comments: pt sitting EOB upon PT arrival  Transfers Overall transfer level: Needs assistance Equipment used: None Transfers: Sit to/from Stand Sit to Stand: Supervision Stand pivot transfers: Supervision       General transfer comment: pt mildly impulsive but steady  Ambulation/Gait Ambulation/Gait assistance: Supervision Gait Distance (Feet): 400 Feet Assistive device: None Gait Pattern/deviations: Step-through pattern(can sway L/R as he is distracted by finding rooms) Gait velocity: wfl   General Gait Details: pt easily distracted, hard time to focus, will run into things if trying to look from room numbers   Stairs Stairs: Yes Stairs assistance: Supervision Stair  Management: Alternating pattern;Forwards;One rail Left Number of Stairs: 12     Wheelchair Mobility    Modified Rankin (Stroke Patients Only) Modified Rankin (Stroke Patients Only) Pre-Morbid Rankin Score: No symptoms Modified Rankin: Moderate disability     Balance Overall balance assessment: Needs assistance Sitting-balance support: Feet supported Sitting balance-Leahy Scale: Normal Sitting balance - Comments: able to sit unsupport with no LOB Postural control: Left lateral lean Standing balance support: During functional activity;No upper extremity supported Standing balance-Leahy Scale: Good Standing balance comment: able to maintain balance with min/mod pertebations             High level balance activites: Backward walking High Level Balance Comments: pt tandem walked x20 feet, pt unsteady with L LE stepping, pt able to amb backwards with min guard x 25 feet     Dynamic Gait Index Level Surface: Normal Change in Gait Speed: Normal Gait with Horizontal Head Turns: Mild Impairment Gait with Vertical Head Turns: Mild Impairment Gait and Pivot Turn: Mild Impairment Step Over Obstacle: Mild Impairment Step Around Obstacles: Mild Impairment Steps: Mild Impairment Total Score: 18      Cognition Arousal/Alertness: Awake/alert Behavior During Therapy: WFL for tasks assessed/performed Overall Cognitive Status: Impaired/Different from baseline Area of Impairment: Orientation;Following commands;Safety/judgement;Awareness;Problem solving;Memory;Attention                 Orientation Level: Time;Situation;Disoriented to Current Attention Level: Selective Memory: Decreased short-term memory Following Commands: Follows multi-step commands with increased time;Follows multi-step commands inconsistently;Follows one step commands consistently Safety/Judgement: Decreased awareness of safety;Decreased awareness of deficits Awareness: Emergent Problem Solving: Requires  verbal cues;Requires tactile cues General Comments: pt much improved from last week.  pt able to find room number wiht minimal verbal cues. Pt with improved vision and able to read words and signs appropriately. Remains to demo decreased short term memory forgetting tasks asked      Exercises      General Comments General comments (skin integrity, edema, etc.): VSS      Pertinent Vitals/Pain Pain Assessment: No/denies pain    Home Living                      Prior Function            PT Goals (current goals can now be found in the care plan section) Acute Rehab PT Goals Time For Goal Achievement: 11/19/19 Potential to Achieve Goals: Good Progress towards PT goals: Progressing toward goals    Frequency    Min 1X/week      PT Plan Current plan remains appropriate    Co-evaluation              AM-PAC PT "6 Clicks" Mobility   Outcome Measure  Help needed turning from your back to your side while in a flat bed without using bedrails?: None Help needed moving from lying on your back to sitting on the side of a flat bed without using bedrails?: None Help needed moving to and from a bed to a chair (including a wheelchair)?: None Help needed standing up from a chair using your arms (e.g., wheelchair or bedside chair)?: None Help needed to walk in hospital room?: None Help needed climbing 3-5 steps with a railing? : A Little 6 Click Score: 23    End of Session Equipment Utilized During Treatment: Gait belt Activity Tolerance: Patient tolerated treatment well Patient left: with call bell/phone within reach;in chair;with chair alarm set Nurse Communication: Mobility status PT Visit Diagnosis: Unsteadiness on feet (R26.81);Difficulty in walking, not elsewhere classified (R26.2);Other symptoms and signs involving the nervous system (R29.898)     Time: 4492-0100 PT Time Calculation (min) (ACUTE ONLY): 20 min  Charges:  $Gait Training: 8-22 mins                      Lewis Shock, PT, DPT Acute Rehabilitation Services Pager #: [phone] Office #: [phone]    Iona Hansen 11/05/2019, 10:29 AM

## 2019-11-05 NOTE — Progress Notes (Signed)
PROGRESS NOTE    Tyler [name]    Code Status: Full Code  BTD:[id number] DOB: May 02, 1971 DOA: 07/23/2019  PCP: Patient, No Pcp Per    Hospital Summary  48 year old male with history of polysubstance abuse including heroine presented to [facility] ER after being found down with a syringe nearby.  He was given Narcan with minimal improvement.  On presentation, he was found to have severe rhabdomyolysis with acute kidney injury and hyperkalemia.  He was started on IV fluids including bicarb drip and transferred to [facility] for further care.  Rhabdomyolysis improved but he had persistence of encephalopathy and was found to have acute bilateral cerebellar infarct.  Neurology was consulted and has subsequently signed off.  PT recommended SNF placement.  Prolonged hospitalization due to placement issues.  Patient is medically stable for discharge to a skilled nursing facility as soon as bed is available.  I personally took care of this patient roughly 1 month ago and he is currently significantly improved from that time  A & P   Principal Problem:   Cerebral embolism with cerebral infarction Active Problems:   Rhabdomyolysis   Acute kidney failure (HCC)   Acute metabolic encephalopathy   Polysubstance abuse (HCC)   Hyperkalemia   Transaminitis   HTN (hypertension)   Protein-calorie malnutrition, severe  Acute bilateral ischemic CVA, nonhemorrhagic -significantly improved -MRI of the brain showed acute CVA -Neurology evaluated the patient and has signed off.  MRA of the head negative. -Ultrasound carotid showed bilateral 1 to 39% ICA stenosis -LDL 83 -A1c 5.1 -Echo showed EF of 60 to 65%.  Lower extremity Dopplers: Negative for DVT -Neurology recommended considering 30-day cardiac event monitoring as outpatient to rule out A. fib if patient neurologically improves -Continue aspirin and statin. -PT continues to recommend SNF after reassessment.  Currently medically stable for discharge.   Acute metabolic encephalopathy Significantly improved and likely at new baseline compared to 1 month ago when I saw him and able to follow commands and have a conversation -Multifactorial: Most likely secondary CVA, drug use, uremia, heavy alcohol use -CT of the head and neck was initially negative for any traumatic finding -UDS, Tylenol and salicylate levels were negative. -TSH, folate, B12 levels all within normal limits -EEG: Generalized diffuse slowing but no seizure activity -Had required intermittent restraints initially during the hospitalization.  Severe rhabdomyolysis/AKI -Resolved with IV fluids.  Renal ultrasound negative.  Off IV fluids  Transaminitis with elevated bilirubin -Probably from alcohol abuse -Right upper quadrant ultrasound showed hepatic steatosis with early cirrhosis with some gallbladder thickening -Resolved.  Alcohol abuse -Advised to quit.  Continue thiamine, multivitamin and folic acid  UTI -Present on admission.  Completed 5 days of Rocephin  Essential hypertension -Stable.  Continue metoprolol.  Severe protein calorie malnutrition -Follow nutrition recommendations  Abdominal pain/back pain -X-ray showed unchanged T11 compression fracture, age-indeterminate L1 fracture -Continue Voltaren gel as needed. -Resolved  Generalized deconditioning -Likely secondary to alcoholism, CVA, prolonged hospitalization -PT, OT recommending SNF, will ask to reassess    DVT prophylaxis: Lovenox Diet: Dysphagia 3 Family Communication: no family at bedside Disposition Plan: Pending SNF availability, difficult placement. Medically stable  Consultants  Neurology  Procedures  Echo  Antibiotics   Anti-infectives (From admission, onward)   Start     Dose/Rate Route Frequency Ordered Stop   07/25/19 0745  cefTRIAXone (ROCEPHIN) 1 g in sodium chloride 0.9 % 100 mL IVPB     1 g 200 mL/hr over 30 Minutes Intravenous Every 24 hours 07/25/19  [id number]  07/29/19 1518           Subjective   Observed ambulating well in the hall with PT. Patient denies any complaints.  Objective   Vitals:   11/04/19 2206 11/05/19 0546 11/05/19 0546 11/05/19 0547  BP: 117/90 (!) 86/67 (!) 86/67   Pulse: 66 68 70   Resp:   20   Temp: (!) 97.4 F (36.3 C) 97.7 F (36.5 C) 97.7 F (36.5 C)   TempSrc: Oral     SpO2: 98% 97% 97%   Weight:    72.6 kg  Height:        Intake/Output Summary (Last 24 hours) at 11/05/2019 1646 Last data filed at 11/05/2019 1540 Gross per 24 hour  Intake 490 ml  Output 250 ml  Net 240 ml   Filed Weights   11/02/19 0546 11/04/19 0502 11/05/19 0547  Weight: 80.3 kg 81.6 kg 72.6 kg    Examination:  Physical Exam Vitals signs and nursing note reviewed.  Constitutional:      Appearance: Normal appearance.  HENT:     Head: Normocephalic and atraumatic.     Nose: Nose normal.     Mouth/Throat:     Mouth: Mucous membranes are moist.  Eyes:     Extraocular Movements: Extraocular movements intact.  Neck:     Musculoskeletal: Normal range of motion. No neck rigidity.  Cardiovascular:     Rate and Rhythm: Normal rate and regular rhythm.  Pulmonary:     Effort: Pulmonary effort is normal.     Breath sounds: Normal breath sounds.  Abdominal:     General: Abdomen is flat.     Palpations: Abdomen is soft.  Musculoskeletal: Normal range of motion.        General: No swelling.     Comments: Intact muscle strength  Neurological:     General: No focal deficit present.     Mental Status: He is alert and oriented to person, place, and time.  Psychiatric:        Mood and Affect: Mood normal.        Behavior: Behavior normal.     Data Reviewed: I have personally reviewed following labs and imaging studies  CBC: Recent Labs  Lab 11/03/19 0836  WBC 10.3  HGB 16.7  HCT 49.4  MCV 92.0  PLT 249   Basic Metabolic Panel: Recent Labs  Lab 11/03/19 0836 11/04/19 0752  NA 142 140  K 4.4 3.9  CL 104 108   CO2 25 23  GLUCOSE 103* 88  BUN 24* 22*  CREATININE 1.34* 1.21  CALCIUM 9.7 9.2   GFR: Estimated Creatinine Clearance: 70.7 mL/min (by C-G formula based on SCr of 1.21 mg/dL). Liver Function Tests: No results for input(s): AST, ALT, ALKPHOS, BILITOT, PROT, ALBUMIN in the last 168 hours. No results for input(s): LIPASE, AMYLASE in the last 168 hours. No results for input(s): AMMONIA in the last 168 hours. Coagulation Profile: No results for input(s): INR, PROTIME in the last 168 hours. Cardiac Enzymes: No results for input(s): CKTOTAL, CKMB, CKMBINDEX, TROPONINI in the last 168 hours. BNP (last 3 results) No results for input(s): PROBNP in the last 8760 hours. HbA1C: No results for input(s): HGBA1C in the last 72 hours. CBG: No results for input(s): GLUCAP in the last 168 hours. Lipid Profile: No results for input(s): CHOL, HDL, LDLCALC, TRIG, CHOLHDL, LDLDIRECT in the last 72 hours. Thyroid Function Tests: No results for input(s): TSH, T4TOTAL, FREET4, T3FREE, THYROIDAB in the last  72 hours. Anemia Panel: No results for input(s): VITAMINB12, FOLATE, FERRITIN, TIBC, IRON, RETICCTPCT in the last 72 hours. Sepsis Labs: No results for input(s): PROCALCITON, LATICACIDVEN in the last 168 hours.  No results found for this or any previous visit (from the past 240 hour(s)).       Radiology Studies: No results found.      Scheduled Meds: .  stroke: mapping our early stages of recovery book   Does not apply Once  . aspirin  325 mg Oral Daily  . atorvastatin  20 mg Oral q1800  . enoxaparin (LOVENOX) injection  40 mg Subcutaneous Q24H  . feeding supplement (ENSURE ENLIVE)  237 mL Oral BID BM  . folic acid  1 mg Oral Daily  . lactulose  20 g Oral BID  . metoprolol tartrate  25 mg Oral BID  . multivitamin with minerals  1 tablet Oral Daily  . omega-3 acid ethyl esters  1 g Oral Daily  . pantoprazole  40 mg Oral Daily  . sucralfate  1 g Oral TID WC & HS  . thiamine  100 mg  Oral Daily   Or  . thiamine  100 mg Intravenous Daily   Continuous Infusions:   LOS: 105 days    Time spent: 17 minutes with over 50% of the time coordinating the patient's care    Jae Dire, DO [facility] Pager [phone]  If 7PM-7AM, please contact night-coverage [email or web address] Password [facility] 11/05/2019, 4:46 PM

## 2019-11-05 NOTE — Progress Notes (Signed)
Occupational Therapy Treatment Patient Details Name: Tyler [name] MRN: [id number] DOB: Jul 16, 1971 Today's Date: 11/05/2019    History of present illness 48 y/o male transferred from [place] [facility] for unresponsiveness, likely secondary to substance abuse. Pt with rhabdomyolysis and AKI. EEG revealed moderate diffuse encephalopathy. PMH includes polysubstance abuse and HTN. Imaging show acute bialteral cerebellar infarcts.    OT comments  Pt making steady progress towards goals this week. Pt needing mod cuing for orientation this session. Pt initially stating it was February and unable to verbal location or situation without suing to sheet hanging on wall. Pt ambulating in room without use of AD and close supervision. Pt standing at sink with increased time and min cuing to locate grooming items on far left of sink. Pt ambulating and verbalizing multiple room numbers as 12 ( this is his room number). Unsure if he is rehearsing in head but not reading numbers when asked. Pt needing mod verbal guidance cuing and use of signage to locate room at end of session. Pt would continue to benefit from OT intervention.   Follow Up Recommendations  SNF;Supervision/Assistance - 24 hour    Equipment Recommendations  None recommended by OT       Precautions / Restrictions Precautions Precautions: Fall Restrictions Weight Bearing Restrictions: No       Mobility Bed Mobility Overal bed mobility: Modified Independent Bed Mobility: Supine to Sit;Sit to Supine     Supine to sit: Modified independent (Device/Increase time) Sit to supine: Modified independent (Device/Increase time)   General bed mobility comments: pt sitting EOB upon PT arrival  Transfers Overall transfer level: Needs assistance Equipment used: None Transfers: Sit to/from Stand Sit to Stand: Supervision Stand pivot transfers: Supervision       General transfer comment: mildly impulsive with min cuing for safety     Balance Overall balance assessment: Needs assistance Sitting-balance support: Feet supported Sitting balance-Leahy Scale: Normal Sitting balance - Comments: able to sit unsupport with no LOB Postural control: Left lateral lean Standing balance support: During functional activity;No upper extremity supported Standing balance-Leahy Scale: Good Standing balance comment: able to maintain balance with min/mod pertebations      High level balance activites: Backward walking High Level Balance Comments: pt tandem walked x20 feet, pt unsteady with L LE stepping, pt able to amb backwards with min guard x 25 feet     Dynamic Gait Index Level Surface: Normal Change in Gait Speed: Normal Gait with Horizontal Head Turns: Mild Impairment Gait with Vertical Head Turns: Mild Impairment Gait and Pivot Turn: Mild Impairment Step Over Obstacle: Mild Impairment Step Around Obstacles: Mild Impairment Steps: Mild Impairment Total Score: 18     ADL either performed or assessed with clinical judgement   ADL       Grooming: Wash/dry hands;Supervision/safety;Standing;Oral care Grooming Details (indicate cue type and reason): min cuing to locate items on upper far left of sink         Vision Baseline Vision/History: No visual deficits Patient Visual Report: Blurring of vision            Cognition Arousal/Alertness: Awake/alert Behavior During Therapy: WFL for tasks assessed/performed Overall Cognitive Status: Impaired/Different from baseline Area of Impairment: Orientation;Following commands;Safety/judgement;Awareness;Problem solving;Memory;Attention      Orientation Level: Time;Situation;Disoriented to Current Attention Level: Selective Memory: Decreased short-term memory Following Commands: Follows multi-step commands with increased time;Follows multi-step commands inconsistently;Follows one step commands consistently Safety/Judgement: Decreased awareness of safety;Decreased awareness  of deficits Awareness: Emergent Problem Solving: Requires verbal cues;Requires tactile cues  General Comments: Pt continues to need mod cuing to read signs correctly and mod verbal guidance cuing and use of signage to  locate room              General Comments VSS    Pertinent Vitals/ Pain       Pain Assessment: No/denies pain         Frequency  Min 1X/week        Progress Toward Goals  OT Goals(current goals can now be found in the care plan section)  Progress towards OT goals: Progressing toward goals  Acute Rehab OT Goals Patient Stated Goal: to get out of here and go home OT Goal Formulation: With patient Time For Goal Achievement: 11/19/19 Potential to Achieve Goals: Good  Plan Discharge plan remains appropriate    Co-evaluation                 AM-PAC OT "6 Clicks" Daily Activity     Outcome Measure   Help from another person eating meals?: None Help from another person taking care of personal grooming?: A Little Help from another person toileting, which includes using toliet, bedpan, or urinal?: A Little Help from another person bathing (including washing, rinsing, drying)?: A Little Help from another person to put on and taking off regular upper body clothing?: None Help from another person to put on and taking off regular lower body clothing?: A Little 6 Click Score: 20    End of Session    OT Visit Diagnosis: Muscle weakness (generalized) (M62.81);Unsteadiness on feet (R26.81)   Activity Tolerance Patient tolerated treatment well   Patient Left in bed;with call bell/phone within reach   Nurse Communication Mobility status        Time: 7517-0017 OT Time Calculation (min): 16 min  Charges: OT General Charges $OT Visit: 1 Visit OT Treatments $Therapeutic Activity: 8-22 mins   Gypsy Decant MS, OTR/L 11/05/2019, 10:49 AM

## 2019-11-06 DIAGNOSIS — I69319 Unspecified symptoms and signs involving cognitive functions following cerebral infarction: Secondary | ICD-10-CM

## 2019-11-06 NOTE — Progress Notes (Signed)
PROGRESS NOTE    Tyler [name]    Code Status: Full Code  ZOX:[id number] DOB: 01/22/1971 DOA: 07/23/2019  PCP: Patient, No Pcp Per    Hospital Summary  48 year old male with history of polysubstance abuse including heroine presented [facility] after being found down with a syringe nearby.  He was given Narcan with minimal improvement.  On presentation, he was found to have severe rhabdomyolysis with acute kidney injury and hyperkalemia.  He was started on IV fluids including bicarb drip and transferred to [facility] for further care.  Rhabdomyolysis improved but he had persistence of encephalopathy and was found to have acute bilateral cerebellar infarct.  Neurology was consulted and has subsequently signed off.  PT recommended SNF placement.  Prolonged hospitalization due to placement issues.  Patient is medically stable for discharge to a skilled nursing facility as soon as bed is available.  I personally took care of this patient roughly 1 month ago and he is currently significantly improved from that time  A & P   Principal Problem:   Cerebral embolism with cerebral infarction Active Problems:   Rhabdomyolysis   Acute kidney failure (HCC)   Acute metabolic encephalopathy   Polysubstance abuse (HCC)   Hyperkalemia   Transaminitis   HTN (hypertension)   Protein-calorie malnutrition, severe  Acute bilateral ischemic CVA, nonhemorrhagic -significantly improved -MRI of the brain showed acute CVA -Neurology evaluated the patient and has signed off.  MRA of the head negative. -Ultrasound carotid showed bilateral 1 to 39% ICA stenosis -LDL 83 -A1c 5.1 -Echo showed EF of 60 to 65%.  Lower extremity Dopplers: Negative for DVT -Neurology recommended considering 30-day cardiac event monitoring as outpatient to rule out A. fib if patient neurologically improves -Continue aspirin and statin.  Acute metabolic encephalopathy Significantly improved and likely at new baseline compared  to 1 month ago when I saw him and able to follow commands and have a conversation -Multifactorial: Most likely secondary CVA, drug use, uremia, heavy alcohol use -CT of the head and neck was initially negative for any traumatic finding -UDS, Tylenol and salicylate levels were negative. -TSH, folate, B12 levels all within normal limits -EEG: Generalized diffuse slowing but no seizure activity -Had required intermittent restraints initially during the hospitalization, now without requirements.  Severe rhabdomyolysis/AKI -Resolved with IV fluids.  Renal ultrasound negative.  Off IV fluids  Transaminitis with elevated bilirubin -Probably from alcohol abuse -Right upper quadrant ultrasound showed hepatic steatosis with early cirrhosis with some gallbladder thickening -Resolved.   Alcohol abuse -Advised to quit.  Continue thiamine, multivitamin and folic acid  UTI -Present on admission.  Completed 5 days of Rocephin  Essential hypertension -Stable.  Continue metoprolol.  Severe protein calorie malnutrition -Follow nutrition recommendations  Abdominal pain/back pain -X-ray showed unchanged T11 compression fracture, age-indeterminate L1 fracture -Continue Voltaren gel as needed. -Resolved  Generalized deconditioning -Likely secondary to alcoholism, CVA, prolonged hospitalization -PT, OT recommending SNF, will ask to reassess    DVT prophylaxis: Lovenox Diet: Dysphagia 3 Family Communication: no family at bedside, patient states he has been having difficulty getting in touch with his family Disposition Plan: Patient was visualized ambulating independently well  today in the hallway.  He is very frustrated that he is still hospitalized.  Currently still in the hospital as PT/OT still recommending SNF due to his cognitive deficits.  He does have some cognitive deficits and is unsafe to go home alone at this time. Discussed this with social work.  Patient states that he  has an  uncle that lives nearby and may be able to take care of him.  Social work is to reach out to family to see if anybody else able to take care of him at home as he does not need much physical rehab but more cognitive help.  He also states that he has no desire to use drugs again in the future and reports that he is done with illicit drug use.  Continue to work on discharge planning for him this patient is currently not safe to discharge home alone  Consultants  Neurology  Procedures  Echo  Antibiotics   Anti-infectives (From admission, onward)   Start     Dose/Rate Route Frequency Ordered Stop   07/25/19 0745  cefTRIAXone (ROCEPHIN) 1 g in sodium chloride 0.9 % 100 mL IVPB     1 g 200 mL/hr over 30 Minutes Intravenous Every 24 hours 07/25/19 0734 07/29/19 1518           Subjective   Patient is very frustrated today that he still hospitalized.  States that he has been unable to contact his son or daughter for quite some time.  Admits that he does have an uncle that lives nearby and may be able to take care of him.  Otherwise denies any complaints.  Objective   Vitals:   11/06/19 0500 11/06/19 0535 11/06/19 0756 11/06/19 1053  BP:  (!) 86/76 107/88 (!) 111/94  Pulse:  70 84 78  Resp:      Temp:  (!) 97.5 F (36.4 C)    TempSrc:  Oral    SpO2:  97%    Weight: 72.6 kg     Height:        Intake/Output Summary (Last 24 hours) at 11/06/2019 1705 Last data filed at 11/06/2019 1323 Gross per 24 hour  Intake 510 ml  Output -  Net 510 ml   Filed Weights   11/04/19 0502 11/05/19 0547 11/06/19 0500  Weight: 81.6 kg 72.6 kg 72.6 kg    Examination:  Physical Exam Vitals signs and nursing note reviewed. Exam conducted with a chaperone present.  Constitutional:      Appearance: Normal appearance.  Eyes:     Extraocular Movements: Extraocular movements intact.  Cardiovascular:     Rate and Rhythm: Normal rate and regular rhythm.  Pulmonary:     Effort: Pulmonary effort is  normal.     Breath sounds: Normal breath sounds.  Abdominal:     General: Abdomen is flat.     Palpations: Abdomen is soft.  Musculoskeletal: Normal range of motion.        General: No swelling.  Neurological:     Mental Status: He is alert.     Motor: No weakness.     Gait: Gait normal.     Comments: Difficulty with reading signs  Psychiatric:        Mood and Affect: Mood normal.        Behavior: Behavior normal.     Data Reviewed: I have personally reviewed following labs and imaging studies  CBC: Recent Labs  Lab 11/03/19 0836  WBC 10.3  HGB 16.7  HCT 49.4  MCV 92.0  PLT 147   Basic Metabolic Panel: Recent Labs  Lab 11/03/19 0836 11/04/19 0752  NA 142 140  K 4.4 3.9  CL 104 108  CO2 25 23  GLUCOSE 103* 88  BUN 24* 22*  CREATININE 1.34* 1.21  CALCIUM 9.7 9.2   GFR: Estimated Creatinine  Clearance: 70.7 mL/min (by C-G formula based on SCr of 1.21 mg/dL). Liver Function Tests: No results for input(s): AST, ALT, ALKPHOS, BILITOT, PROT, ALBUMIN in the last 168 hours. No results for input(s): LIPASE, AMYLASE in the last 168 hours. No results for input(s): AMMONIA in the last 168 hours. Coagulation Profile: No results for input(s): INR, PROTIME in the last 168 hours. Cardiac Enzymes: No results for input(s): CKTOTAL, CKMB, CKMBINDEX, TROPONINI in the last 168 hours. BNP (last 3 results) No results for input(s): PROBNP in the last 8760 hours. HbA1C: No results for input(s): HGBA1C in the last 72 hours. CBG: No results for input(s): GLUCAP in the last 168 hours. Lipid Profile: No results for input(s): CHOL, HDL, LDLCALC, TRIG, CHOLHDL, LDLDIRECT in the last 72 hours. Thyroid Function Tests: No results for input(s): TSH, T4TOTAL, FREET4, T3FREE, THYROIDAB in the last 72 hours. Anemia Panel: No results for input(s): VITAMINB12, FOLATE, FERRITIN, TIBC, IRON, RETICCTPCT in the last 72 hours. Sepsis Labs: No results for input(s): PROCALCITON, LATICACIDVEN in the  last 168 hours.  No results found for this or any previous visit (from the past 240 hour(s)).       Radiology Studies: No results found.      Scheduled Meds: .  stroke: mapping our early stages of recovery book   Does not apply Once  . aspirin  325 mg Oral Daily  . atorvastatin  20 mg Oral q1800  . enoxaparin (LOVENOX) injection  40 mg Subcutaneous Q24H  . feeding supplement (ENSURE ENLIVE)  237 mL Oral BID BM  . folic acid  1 mg Oral Daily  . lactulose  20 g Oral BID  . metoprolol tartrate  25 mg Oral BID  . multivitamin with minerals  1 tablet Oral Daily  . omega-3 acid ethyl esters  1 g Oral Daily  . pantoprazole  40 mg Oral Daily  . thiamine  100 mg Oral Daily   Or  . thiamine  100 mg Intravenous Daily   Continuous Infusions:   LOS: 106 days    Time spent: 25 minutes with over 50% of the time coordinating the patient's care    Jae Dire, DO [facility] Pager [phone]  If 7PM-7AM, please contact night-coverage [email or web address] Password [email or web address] 11/06/2019, 5:05 PM

## 2019-11-06 NOTE — Progress Notes (Signed)
  Speech Language Pathology Treatment: Cognitive-Linquistic  Patient Details Name: Tyler [name] MRN: [id number] DOB: February 12, 1971 Today's Date: 11/06/2019 Time: 2951-8841 SLP Time Calculation (min) (ACUTE ONLY): 15 min  Assessment / Plan / Recommendation Clinical Impression   Pt seen for skilled ST intervention targeting goals for orientation, and basic attention/problem solving. Pt is oriented to person and place, but continues to require max multimodal cues to use posted environmental strategies. Pt demonstrates poor recall after 1-2 minutes of information, and requires cues to correctly demonstrate use of call bell. Pt's goals were downgraded last month, and he continues to exhibit a lack of progress toward goals. Skilled ST intervention will be discontinued at this time. 24 hour supervision is recommended, due to significant confusion and cognitive impairment.    HPI HPI: 48yo male admitted 07/23/2019 afater being found down with syringe nearby. PMH: polysubstance abuse including heroin. MRI - patchy acute bilateral cerebral and cerebellar infarcts, right temporal lobe encephalomalacia and chronic cerebral white matter disease. 9th grade education, worked "odd jobs". No baseline deficits per daughter      SLP Plan  Discharge SLP treatment due to lack of functional progress toward goals       Recommendations  24 hour supervision. 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      Petros, Celina, [place] Speech Language Pathologist Office: [phone]  Shonna Chock 11/06/2019, 9:01 AM

## 2019-11-06 NOTE — Progress Notes (Signed)
Patient voice frustration with having to be here in the hospital. I took patient outside for some fresh air. He really enjoyed and was thankful.

## 2019-11-06 NOTE — TOC Progression Note (Addendum)
Transition of Care [facility]) - Progression Note    Patient Details  Name: Tyler [name] MRN: [id number] Date of Birth: 1971/01/01  Transition of Care [facility]) CM/SW [place], LCSW Phone Number: 11/06/2019, 9:08 AM  Clinical Narrative:    TOC team continues to follow patient. No current SNF or ALF bed offers at this time and patient remains on Difficult to Place list.   Update: CSW discussed case with MD. CSW can look into if patient can qualify for [facility] for a few hours a day but TOC team concern at this time is that patient is still confused and unable to live alone. Will follow up with patient's family.    Expected Discharge Plan: Skilled Nursing Facility Barriers to Discharge: Active Substance Use - Placement, SNF Pending payor source - LOG, SNF Pending bed offer  Expected Discharge Plan and Services Expected Discharge Plan: [place]   Discharge Planning Services: CM Consult, Other - See comment(Pt will need LOG for SNF placement) Post Acute Care Choice: [place] arrangements for the past 2 months: Single Family Home                 DME Arranged: N/A           HH Agency: NA         Social Determinants of Health (SDOH) Interventions    Readmission Risk Interventions No flowsheet data found.

## 2019-11-06 NOTE — Progress Notes (Signed)
Nutrition Follow-up  RD working remotely.  DOCUMENTATION CODES:   Severe malnutrition in context of acute illness/injury  INTERVENTION:   - Continue Ensure Enlive po BID, each supplement provides 350 kcal and 20 grams of protein  - Continue Magic cup TID with meals, each supplement provides 290 kcal and 9 grams of protein  - Continue MVI with minerals daily  NUTRITION DIAGNOSIS:   Severe Malnutrition related to acute illness (acute toxic/metabolic encephalopathy) as evidenced by moderate fat depletion, severe fat depletion, moderate muscle depletion, severe muscle depletion, energy intake < or equal to 50% for > or equal to 5 days, percent weight loss.  Ongoing, being addressed via oral nutrition supplements  GOAL:   Patient will meet greater than or equal to 90% of their needs  Progressing  MONITOR:   PO intake, Supplement acceptance, Labs, Weight trends, Skin, I & O's  REASON FOR ASSESSMENT:   LOS    ASSESSMENT:   Tyler [name] is a 48 y.o. male with medical history significant of polysubstance abuse including heroin.  Brought in to [facility] ED just before [place] after being found down by friend last evening with nearby syringe.  Pt is medically stable for discharge, awaiting SNF placement.  Pt accepting supplements per MAR. PO intake at meal times is mostly 100%.  Pt remains A/O to self only. RD did not attempt to call pt at this time.  Pt with recent weight gain which is favorable given malnutrition.  Meal Completion: 25-100% x last 8 recorded meals (averaging 84%)  Medications reviewed and include: Ensure Enlive BID, folic acid, lactulose, MVI with minerals daily, Lovaza, Protonix, thiamine  Labs reviewed.  Diet Order:   Diet Order            DIET DYS 3 Room service appropriate? No; Fluid consistency: Thin  Diet effective now              EDUCATION NEEDS:   Not appropriate for education at this time  Skin:  Skin Assessment: Reviewed RN  Assessment  Last BM:  11/04/19  Height:   Ht Readings from Last 1 Encounters:  08/09/19 5\' 8"  (1.727 m)    Weight:   Wt Readings from Last 1 Encounters:  11/06/19 72.6 kg    Ideal Body Weight:  70 kg  BMI:  Body mass index is 24.33 kg/m.  Estimated Nutritional Needs:   Kcal:  2000-2200  Protein:  105-120 grams  Fluid:  > 2 L    Tyler Face, MS, RD, LDN Inpatient Clinical Dietitian Pager: [phone] Weekend/After Hours: [phone]

## 2019-11-07 NOTE — Progress Notes (Signed)
PROGRESS NOTE    Tyler [name]    Code Status: Full Code  SJG:[id number] DOB: 1971-11-05 DOA: 07/23/2019  PCP: Patient, No Pcp Per    Hospital Summary  48 year old male with history of polysubstance abuse including heroine presented to [facility] ER after being found down with a syringe nearby.  He was given Narcan with minimal improvement.  On presentation, he was found to have severe rhabdomyolysis with acute kidney injury and hyperkalemia.  He was started on IV fluids including bicarb drip and transferred to [facility] for further care.  Rhabdomyolysis improved but he had persistence of encephalopathy and was found to have acute bilateral cerebellar infarct.  Neurology was consulted and has subsequently signed off.  PT recommended SNF placement.  Prolonged hospitalization due to placement issues.  Patient is medically stable for discharge to a skilled nursing facility as soon as bed is available.  I personally took care of this patient roughly 1 month ago and he is currently significantly improved from that time  A & P   Principal Problem:   Cerebral embolism with cerebral infarction Active Problems:   Rhabdomyolysis   Acute kidney failure (HCC)   Acute metabolic encephalopathy   Polysubstance abuse (HCC)   Hyperkalemia   Transaminitis   HTN (hypertension)   Protein-calorie malnutrition, severe  Acute bilateral ischemic CVA, nonhemorrhagic Stable with some suspected new baseline cognitive deficits MRI of the brain showed acute CVA Neurology evaluated the patient and has signed off.  MRA of the head negative. Ultrasound carotid showed bilateral 1 to 39% ICA stenosis LDL 83, A1c 5.1 Echo showed EF of 60 to 65%.  Lower extremity Dopplers: Negative for DVT -Neurology recommended considering 30-day cardiac event monitoring as outpatient to rule out A. fib if patient neurologically improves -Continue aspirin and statin -MOCA assessment  Acute metabolic encephalopathy  Multifactorial: Most likely secondary CVA, drug use, uremia, heavy alcohol use resolved CT of the head and neck was initially negative for any traumatic finding UDS, Tylenol and salicylate levels were negative. TSH, folate, B12 levels all within normal limits EEG: Generalized diffuse slowing but no seizure activity Had required intermittent restraints initially during the hospitalization, now without requirements.  Severe rhabdomyolysis/AKI -Resolved with IV fluids.  Renal ultrasound negative.  Off IV fluids  Transaminitis with elevated bilirubin -Probably from alcohol abuse -Right upper quadrant ultrasound showed hepatic steatosis with early cirrhosis with some gallbladder thickening -Resolved.   Alcohol abuse -Advised to quit.  Continue thiamine, multivitamin and folic acid  UTI -Present on admission.  Completed 5 days of Rocephin  Essential hypertension -Stable.  Continue metoprolol.  Severe protein calorie malnutrition -Follow nutrition recommendations  Abdominal pain/back pain -X-ray showed unchanged T11 compression fracture, age-indeterminate L1 fracture -Continue Voltaren gel as needed. -Resolved  Generalized deconditioning -Likely secondary to alcoholism, CVA, prolonged hospitalization -PT, OT recommending SNF, will ask to reassess    DVT prophylaxis: Lovenox Diet: Dysphagia 3 Family Communication: no family at bedside, patient states he has been having difficulty getting in touch with his family Disposition Plan: Difficult placement. Would likely be a good candidate for an assisted living facility  Consultants  Neurology  Procedures  Echo  Antibiotics   Anti-infectives (From admission, onward)   Start     Dose/Rate Route Frequency Ordered Stop   07/25/19 0745  cefTRIAXone (ROCEPHIN) 1 g in sodium chloride 0.9 % 100 mL IVPB     1 g 200 mL/hr over 30 Minutes Intravenous Every 24 hours 07/25/19 0734 07/29/19 1518  Subjective    He continues to be understandably frustrated at his current disposition. He is getting very anxious and stressed after prolonged hospitalization.  Objective   Vitals:   11/06/19 1521 11/06/19 2143 11/07/19 0514 11/07/19 1237  BP: 128/87 (!) 129/94 100/74 (!) 125/95  Pulse: 65 80 60 72  Resp: 17 16 14 16   Temp: 98 F (36.7 C) 98.2 F (36.8 C) 97.9 F (36.6 C) (!) 97.5 F (36.4 C)  TempSrc: Oral Oral Oral Oral  SpO2: 99% 98% 96% 99%  Weight:   73.5 kg   Height:        Intake/Output Summary (Last 24 hours) at 11/07/2019 1306 Last data filed at 11/07/2019 0900 Gross per 24 hour  Intake 520 ml  Output -  Net 520 ml   Filed Weights   11/05/19 0547 11/06/19 0500 11/07/19 0514  Weight: 72.6 kg 72.6 kg 73.5 kg    Examination:  Physical Exam Vitals signs and nursing note reviewed.  Constitutional:      Appearance: Normal appearance.  HENT:     Head: Normocephalic and atraumatic.  Neck:     Musculoskeletal: Normal range of motion. No neck rigidity.  Cardiovascular:     Rate and Rhythm: Normal rate and regular rhythm.  Pulmonary:     Effort: Pulmonary effort is normal.     Breath sounds: Normal breath sounds.  Abdominal:     General: Abdomen is flat.     Palpations: Abdomen is soft.  Musculoskeletal: Normal range of motion.        General: No swelling.  Neurological:     Mental Status: He is alert and oriented to person, place, and time.  Psychiatric:        Mood and Affect: Mood is anxious and depressed.      Data Reviewed: I have personally reviewed following labs and imaging studies  CBC: Recent Labs  Lab 11/03/19 0836  WBC 10.3  HGB 16.7  HCT 49.4  MCV 92.0  PLT 249   Basic Metabolic Panel: Recent Labs  Lab 11/03/19 0836 11/04/19 0752  NA 142 140  K 4.4 3.9  CL 104 108  CO2 25 23  GLUCOSE 103* 88  BUN 24* 22*  CREATININE 1.34* 1.21  CALCIUM 9.7 9.2   GFR: Estimated Creatinine Clearance: 70.7 mL/min (by C-G formula based on SCr of 1.21  mg/dL). Liver Function Tests: No results for input(s): AST, ALT, ALKPHOS, BILITOT, PROT, ALBUMIN in the last 168 hours. No results for input(s): LIPASE, AMYLASE in the last 168 hours. No results for input(s): AMMONIA in the last 168 hours. Coagulation Profile: No results for input(s): INR, PROTIME in the last 168 hours. Cardiac Enzymes: No results for input(s): CKTOTAL, CKMB, CKMBINDEX, TROPONINI in the last 168 hours. BNP (last 3 results) No results for input(s): PROBNP in the last 8760 hours. HbA1C: No results for input(s): HGBA1C in the last 72 hours. CBG: No results for input(s): GLUCAP in the last 168 hours. Lipid Profile: No results for input(s): CHOL, HDL, LDLCALC, TRIG, CHOLHDL, LDLDIRECT in the last 72 hours. Thyroid Function Tests: No results for input(s): TSH, T4TOTAL, FREET4, T3FREE, THYROIDAB in the last 72 hours. Anemia Panel: No results for input(s): VITAMINB12, FOLATE, FERRITIN, TIBC, IRON, RETICCTPCT in the last 72 hours. Sepsis Labs: No results for input(s): PROCALCITON, LATICACIDVEN in the last 168 hours.  No results found for this or any previous visit (from the past 240 hour(s)).       Radiology Studies: No results found.  Scheduled Meds: .  stroke: mapping our early stages of recovery book   Does not apply Once  . aspirin  325 mg Oral Daily  . atorvastatin  20 mg Oral q1800  . enoxaparin (LOVENOX) injection  40 mg Subcutaneous Q24H  . feeding supplement (ENSURE ENLIVE)  237 mL Oral BID BM  . folic acid  1 mg Oral Daily  . lactulose  20 g Oral BID  . metoprolol tartrate  25 mg Oral BID  . multivitamin with minerals  1 tablet Oral Daily  . omega-3 acid ethyl esters  1 g Oral Daily  . pantoprazole  40 mg Oral Daily  . thiamine  100 mg Oral Daily   Or  . thiamine  100 mg Intravenous Daily   Continuous Infusions:   LOS: 107 days    Time spent: 20 minutes with over 50% of the time coordinating the patient's care    Jae DireJared E Louvinia Cumbo, DO  [facility] Pager [phone]  If 7PM-7AM, please contact night-coverage [email or web address] Password [email or web address] 11/07/2019, 1:06 PM

## 2019-11-07 NOTE — Consult Note (Signed)
[facility]   Reason for Consult:  "depression, cognitive assessment" Referring Physician:  Dr Dairl PonderSegal Patient Identification: Tyler [name] MRN:  [id number] Principal Diagnosis: Cerebral embolism with cerebral infarction [facility](HCC) Diagnosis:  Principal Problem:   Cerebral embolism with cerebral infarction Active Problems:   Rhabdomyolysis   Acute kidney failure (HCC)   Acute metabolic encephalopathy   Polysubstance abuse (HCC)   Hyperkalemia   Transaminitis   HTN (hypertension)   Protein-calorie malnutrition, severe   Total Time spent with patient: 30 minutes  Subjective:   Tyler [name] is a 48 y.o. male patient admitted with acute bilateral cerebellar infarct. Patient alert to self and place only. Patient appears to have limited insight into situation. Patient denies suicidal and homicidal ideations. Patient denies paranoia and hallucinations. Patient states "I think I used to use drugs but I honestly cannot remember, everything seems jumbled up in my brain." Patient appears anxious at times, states "I would like to talk to my son and my daughter but I don't know where they are." Patient would like to live with aunt or brother, "they can't right now and I understand that, "I feel caged up in here."  Case discussed with Dr Lucianne MussKumar.  HPI:  Patient found down with syringe nearby, history of heroin use disorder. Patient found to have acute bilateral cerebellar infarct.   Past Psychiatric History: Polysubstance Use Disorder  Risk to Self:  No Risk to Others:  No Prior Inpatient Therapy:  Denies Prior Outpatient Therapy:  Denies  Past Medical History:  Past Medical History:  Diagnosis Date  . Alcohol abuse   . Back pain   . Hypertension   . Kidney disease     Past Surgical History:  Procedure Laterality Date  . MANDIBLE SURGERY    . NERVE, TENDON AND ARTERY REPAIR Left 10/07/2014   Procedure: ORIF PROXIMAL THUMB REPAIR OF EPL AND FLP TENDON MICROSCOPIC  NERVE, TENDON AND RADIAL ARTERY REPAIR WITH WOUND EXPLORATION;  Surgeon: Dairl PonderMatthew Weingold, MD;  Location: [facility] OR;  Service: Orthopedics;  Laterality: Left;  OEC, MICROSCOPE   Family History: History reviewed. No pertinent family history. Family Psychiatric  History: Denies Social History:  Social History   Substance and Sexual Activity  Alcohol Use Yes  . Alcohol/week: 2.0 standard drinks  . Types: 2 Cans of beer per week     Social History   Substance and Sexual Activity  Drug Use Yes    Social History   Socioeconomic History  . Marital status: Married    Spouse name: Not on file  . Number of children: Not on file  . Years of education: Not on file  . Highest education level: Not on file  Occupational History  . Not on file  Social Needs  . Financial resource strain: Not on file  . Food insecurity    Worry: Not on file    Inability: Not on file  . Transportation needs    Medical: Not on file    Non-medical: Not on file  Tobacco Use  . Smoking status: Current Every Day Smoker    Packs/day: 1.00  . Smokeless tobacco: Current User  Substance and Sexual Activity  . Alcohol use: Yes    Alcohol/week: 2.0 standard drinks    Types: 2 Cans of beer per week  . Drug use: Yes  . Sexual activity: Not on file  Lifestyle  . Physical activity    Days per week: Not on file    Minutes per session: Not on  file  . Stress: Not on file  Relationships  . Social Herbalist on phone: Not on file    Gets together: Not on file    Attends religious service: Not on file    Active member of club or organization: Not on file    Attends meetings of clubs or organizations: Not on file    Relationship status: Not on file  Other Topics Concern  . Not on file  Social History Narrative  . Not on file   Additional Social History:    Allergies:  No Known Allergies  Labs: No results found for this or any previous visit (from the past 48 hour(s)).  Current Facility-Administered  Medications  Medication Dose Route Frequency Provider Last Rate Last Dose  .  stroke: mapping our early stages of recovery book   Does not apply Once Amin, Jeanella Flattery, MD      . acetaminophen (TYLENOL) tablet 650 mg  650 mg Oral Q4H PRN Lovey Newcomer T, NP   650 mg at 10/20/19 1818   Or  . acetaminophen (TYLENOL) suppository 650 mg  650 mg Rectal Q6H PRN Blount, Lolita Cram, NP      . alum & mag hydroxide-simeth (MAALOX/MYLANTA) 200-200-20 MG/5ML suspension 30 mL  30 mL Oral Q4H PRN Amin, Ankit Chirag, MD      . aspirin tablet 325 mg  325 mg Oral Daily Amin, Ankit Chirag, MD   325 mg at 11/07/19 0857  . atorvastatin (LIPITOR) tablet 20 mg  20 mg Oral q1800 Kyle, Tyler A, DO   20 mg at 11/06/19 1840  . diclofenac sodium (VOLTAREN) 1 % transdermal gel 2 g  2 g Topical TID PRN Harold Hedge, MD      . enoxaparin (LOVENOX) injection 40 mg  40 mg Subcutaneous Q24H Alma Friendly, MD   40 mg at 11/06/19 1053  . feeding supplement (ENSURE ENLIVE) (ENSURE ENLIVE) liquid 237 mL  237 mL Oral BID BM Starla Link, Kshitiz, MD   237 mL at 11/07/19 0858  . folic acid (FOLVITE) tablet 1 mg  1 mg Oral Daily Aline August, MD   1 mg at 11/07/19 0857  . guaiFENesin-dextromethorphan (ROBITUSSIN DM) 100-10 MG/5ML syrup 5 mL  5 mL Oral Q4H PRN Amin, Ankit Chirag, MD   5 mL at 10/22/19 1052  . haloperidol lactate (HALDOL) injection 2-5 mg  2-5 mg Intravenous Q6H PRN Alekh, Kshitiz, MD       Or  . haloperidol lactate (HALDOL) injection 2-5 mg  2-5 mg Intramuscular Q6H PRN Alekh, Kshitiz, MD      . hydrALAZINE (APRESOLINE) injection 10 mg  10 mg Intravenous Q6H PRN Bodenheimer, Charles A, NP   10 mg at 07/31/19 0414  . hydrocortisone (ANUSOL-HC) 2.5 % rectal cream 1 application  1 application Topical QID PRN Amin, Ankit Chirag, MD      . hydrocortisone cream 1 % 1 application  1 application Topical TID PRN Amin, Ankit Chirag, MD      . ipratropium-albuterol (DUONEB) 0.5-2.5 (3) MG/3ML nebulizer solution 3 mL  3 mL  Nebulization Q4H PRN Amin, Ankit Chirag, MD      . lactulose (CHRONULAC) 10 GM/15ML solution 20 g  20 g Oral BID Amin, Ankit Chirag, MD   20 g at 11/07/19 0855  . metoprolol tartrate (LOPRESSOR) tablet 25 mg  25 mg Oral BID Gardiner Barefoot, NP   Stopped at 11/07/19 [phone]  . multivitamin with minerals tablet 1 tablet  1 tablet Oral Daily Briant Cedar, MD   1 tablet at 11/07/19 0857  . Muscle Rub CREA 1 application  1 application Topical PRN Dimple Nanas, MD   1 application at 10/01/19 0955  . omega-3 acid ethyl esters (LOVAZA) capsule 1 g  1 g Oral Daily Alessandra Bevels, MD   1 g at 11/07/19 0855  . ondansetron (ZOFRAN) tablet 4 mg  4 mg Oral Q6H PRN Hillary Bow, DO   4 mg at 09/13/19 2113   Or  . ondansetron (ZOFRAN) injection 4 mg  4 mg Intravenous Q6H PRN Hillary Bow, DO      . pantoprazole (PROTONIX) EC tablet 40 mg  40 mg Oral Daily Regalado, Belkys A, MD   40 mg at 11/07/19 0856  . phenol (CHLORASEPTIC) mouth spray 1 spray  1 spray Mouth/Throat PRN Amin, Ankit Chirag, MD      . polyethylene glycol (MIRALAX / GLYCOLAX) packet 17 g  17 g Oral Daily PRN Amin, Ankit Chirag, MD      . polyvinyl alcohol (LIQUIFILM TEARS) 1.4 % ophthalmic solution 1 drop  1 drop Both Eyes PRN Amin, Ankit Chirag, MD      . senna-docusate (Senokot-S) tablet 2 tablet  2 tablet Oral QHS PRN Amin, Ankit Chirag, MD      . sodium chloride (OCEAN) 0.65 % nasal spray 1 spray  1 spray Each Nare PRN Amin, Ankit Chirag, MD      . sodium chloride flush (NS) 0.9 % injection 10-40 mL  10-40 mL Intracatheter PRN Amin, Ankit Chirag, MD      . thiamine (VITAMIN B-1) tablet 100 mg  100 mg Oral Daily Lyda Perone M, DO   100 mg at 11/07/19 9371   Or  . thiamine (B-1) injection 100 mg  100 mg Intravenous Daily Lyda Perone M, DO   100 mg at 11/01/19 1114  . traMADol (ULTRAM) tablet 50 mg  50 mg Oral Q6H PRN Edsel Petrin, DO   50 mg at 10/21/19 1700    Musculoskeletal: Strength & Muscle Tone:  within normal limits Gait & Station: normal Patient leans: N/A  Psychiatric Specialty Exam: Physical Exam  Nursing note and vitals reviewed. Constitutional: He appears well-developed.  HENT:  Head: Normocephalic.  Cardiovascular: Normal rate.  Respiratory: Effort normal.  Neurological: He is alert.    Review of Systems  Constitutional: Negative.   HENT: Negative.   Eyes: Negative.   Respiratory: Negative.   Cardiovascular: Negative.   Gastrointestinal: Negative.   Genitourinary: Negative.   Musculoskeletal: Negative.   Skin: Negative.   Neurological: Negative.   Endo/Heme/Allergies: Negative.   Psychiatric/Behavioral: Positive for substance abuse. The patient is nervous/anxious.     Blood pressure (!) 125/95, pulse 72, temperature (!) 97.5 F (36.4 C), temperature source Oral, resp. rate 16, height  (1.727 m), weight 73.5 kg, SpO2 99 %.Body mass index is 24.63 kg/m.  General Appearance: Casual and Fairly Groomed  Eye Contact:  Good  Speech:  Clear and Coherent and Normal Rate  Volume:  Normal  Mood:  Anxious  Affect:  Appropriate and Congruent  Thought Process:  Coherent, Goal Directed and Descriptions of Associations: Intact  Orientation:  Other:  Oriented to self, place  Thought Content:  Logical  Suicidal Thoughts:  No  Homicidal Thoughts:  No  Memory:  Immediate;   Poor Recent;   Poor Remote;   Poor  Judgement:  Impaired  Insight:  Lacking  Psychomotor Activity:  Normal  Concentration:  Concentration: Fair and Attention Span: Fair  Recall:  Poor  Fund of Knowledge:  Fair  Language:  Fair  Akathisia:  No  Handed:  Right  AIMS (if indicated):     Assets:  Communication Skills Desire for Improvement  ADL's:  Intact  Cognition:  Impaired,  Mild  Sleep:        Treatment Plan Summary: Patient does not appear to have the mental capacity to make an informed decision, would benefit from a legal guardian to help make medical decisions. Patient does not  appear to understand why he needs care. Recommend someone assume guardianship.  Disposition: No evidence of imminent risk to self or others at present.   Patient does not meet criteria for psychiatric inpatient admission. Supportive therapy provided about ongoing stressors.  Patrcia Dolly, FNP 11/07/2019 3:38 PM

## 2019-11-08 NOTE — Progress Notes (Signed)
   In conversation with patient's aunt, Payton Doughty [phone]), she stated that she was aware that the patient was in the hospital but did not know that he was unable to get a bed due to his history of drug use.  She stated that unfortunately she works 2 jobs and would not be able to help take care of Annie Main and be his guardian she wishes she could help more.  She admitted that the patient has a son and daughter and that his son is 50 years old and is not responsible and would not likely be able to help take care of the patient.  She stated that she was recently texted by patient's daughter, Leandra Kern, from a different phone number than the one we have in the system.  She gave me this phone number to call [phone]) to discuss further disposition.  I called this phone number however it turns out this is actually not [facility] phone number and in fact Melanie's ex-boyfriend, [name] Fox's phone number.  According to Wyn Forster have a child together and Threasa Beards was previously on pills when they begin their relationship however had been clean for several years time.  Unfortunately, recently, Threasa Beards became involved with another person and unexpectedly left [name] and left her phone behind which is the phone number we have in the system [phone]).  She has since been with this other person and has been hooked on drugs as of recently.  [name] was very helpful.  He stated that he works the night shift and is currently trying to get custody of their child together he does speak with Annie Main and answers Melanie's phone when he calls and occasionally they talk.  He feels bad because he knows Annie Main is still in the hospital on wants to give him someone to talk to.  He stated he is willing to help the best that he can and would be willing to help take care of him as well but is under a lot of pressure with his own social situation.  [name] stated that since he works the night shift and  is traveling out of state for a few days, the best time to call him would be tomorrow, 12/4, between 12 and 1 PM.  This was communicated with Cedric Fishman, social worker.    -Marva Panda, DO

## 2019-11-08 NOTE — Progress Notes (Signed)
Patient transfer to the 2M. Report given to 2M RN. All belongingness taken by patient. Patient was alert and oriented.

## 2019-11-08 NOTE — Progress Notes (Signed)
PROGRESS NOTE    Tyler [name]    Code Status: Full Code  BJY:[id number] DOB: [birth year] DOA: 07/23/2019  PCP: Patient, No Pcp Per    Hospital Summary  49 year old male with history of polysubstance abuse including heroine presented to [facility] ER after being found down with a syringe nearby.  He was given Narcan with minimal improvement.  On presentation, he was found to have severe rhabdomyolysis with acute kidney injury and hyperkalemia.  He was started on IV fluids including bicarb drip and transferred to [facility] for further care.  Rhabdomyolysis improved but he had persistence of encephalopathy and was found to have acute bilateral cerebellar infarct.  Neurology was consulted and has subsequently signed off.  PT recommended SNF placement.  Prolonged hospitalization due to placement issues.    Patient is medically stable for discharge.  Barriers to discharge currently are patient's cognitive status secondary to his issues as below as well as lack of ability to gain guardianship at this point as he does not have any family members willing to take care of him and patient is a difficult placement at SNF or ALF.  A & P   Principal Problem:   Cerebral embolism with cerebral infarction Active Problems:   Rhabdomyolysis   Acute kidney failure (HCC)   Acute metabolic encephalopathy   Polysubstance abuse (HCC)   Hyperkalemia   Transaminitis   HTN (hypertension)   Protein-calorie malnutrition, severe  Acute bilateral ischemic CVA, nonhemorrhagic Stable with some new baseline cognitive deficits MRI of the brain showed acute CVA Neurology evaluated the patient and has signed off.  MRA of the head negative. Ultrasound carotid showed bilateral 1 to 39% ICA stenosis LDL 83, A1c 5.1 Echo showed EF of 60 to 65%.  Lower extremity Dopplers: Negative for DVT -Neurology recommended considering 30-day cardiac event monitoring as outpatient to rule out A. fib if patient neurologically improves  -Continue aspirin and statin  Cognitive deficit secondary to above Per psych: Patient does not have mental capacity to make an informed decision and recommending someone assume guardianship for him.  Unfortunately this is very difficult as so far no one has been willing to assume guardianship. -Currently awaiting SNF or ALF bed -will discuss with patient's aunt this afternoon when she gets off work  Acute metabolic encephalopathy Multifactorial: Most likely secondary CVA, drug use, uremia, heavy alcohol use resolved CT of the head and neck was initially negative for any traumatic finding UDS, Tylenol and salicylate levels were negative. TSH, folate, B12 levels all within normal limits EEG: Generalized diffuse slowing but no seizure activity Had required intermittent restraints initially during the hospitalization, now without requirements.  Severe rhabdomyolysis/AKI -Resolved with IV fluids.  Renal ultrasound negative.  Off IV fluids  Transaminitis with elevated bilirubin -Probably from alcohol abuse -Right upper quadrant ultrasound showed hepatic steatosis with early cirrhosis with some gallbladder thickening -Resolved.   Alcohol abuse -Advised to quit.  Continue thiamine, multivitamin and folic acid  UTI -Present on admission.  Completed 5 days of Rocephin  Essential hypertension -Stable.  Continue metoprolol.  Severe protein calorie malnutrition -Follow nutrition recommendations  Abdominal pain/back pain -X-ray showed unchanged T11 compression fracture, age-indeterminate L1 fracture -Continue Voltaren gel as needed. -Resolved  Generalized deconditioning -Likely secondary to alcoholism, CVA, prolonged hospitalization -PT, OT recommending SNF, will ask to reassess    DVT prophylaxis: Lovenox Diet: Dysphagia 3 Family Communication: I have been unable to contact patient's family.  Patient's aunt called back and stated she was at work and  can talk around 315  this afternoon.  Will call back then Disposition Plan: Difficult placement. Would likely be a good candidate for an assisted living facility unless he can have someone assume guardianship.  It was discussed with the patient  Consultants  Neurology  Procedures  Echo  Antibiotics   Anti-infectives (From admission, onward)   Start     Dose/Rate Route Frequency Ordered Stop   07/25/19 0745  cefTRIAXone (ROCEPHIN) 1 g in sodium chloride 0.9 % 100 mL IVPB     1 g 200 mL/hr over 30 Minutes Intravenous Every 24 hours 07/25/19 0734 07/29/19 1518           Subjective   Patient is very frustrated his disposition at this time and feels that he is stuck.  Otherwise he denies any complaints  Objective   Vitals:   11/07/19 0514 11/07/19 1237 11/07/19 2223 11/08/19 0700  BP: 100/74 (!) 125/95  116/75  Pulse: 60 72  89  Resp: 14 16  20   Temp: 97.9 F (36.6 C) (!) 97.5 F (36.4 C) 98.1 F (36.7 C) 97.9 F (36.6 C)  TempSrc: Oral Oral Oral Oral  SpO2: 96% 99%  97%  Weight: 73.5 kg     Height:        Intake/Output Summary (Last 24 hours) at 11/08/2019 1014 Last data filed at 11/08/2019 0900 Gross per 24 hour  Intake 720 ml  Output -  Net 720 ml   Filed Weights   11/05/19 0547 11/06/19 0500 11/07/19 0514  Weight: 72.6 kg 72.6 kg 73.5 kg    Examination:  Physical Exam Vitals signs and nursing note reviewed.  Constitutional:      Appearance: Normal appearance.  HENT:     Head: Normocephalic and atraumatic.     Mouth/Throat:     Mouth: Mucous membranes are moist.  Cardiovascular:     Rate and Rhythm: Normal rate and regular rhythm.  Pulmonary:     Effort: Pulmonary effort is normal.     Breath sounds: Normal breath sounds.  Abdominal:     General: Abdomen is flat.     Palpations: Abdomen is soft.  Musculoskeletal:     Comments: Ambulating well  Neurological:     Mental Status: He is alert. Mental status is at baseline.     Comments: He is oriented x3   Psychiatric:        Mood and Affect: Mood normal.        Behavior: Behavior normal.      Data Reviewed: I have personally reviewed following labs and imaging studies  CBC: Recent Labs  Lab 11/03/19 0836  WBC 10.3  HGB 16.7  HCT 49.4  MCV 92.0  PLT 233   Basic Metabolic Panel: Recent Labs  Lab 11/03/19 0836 11/04/19 0752  NA 142 140  K 4.4 3.9  CL 104 108  CO2 25 23  GLUCOSE 103* 88  BUN 24* 22*  CREATININE 1.34* 1.21  CALCIUM 9.7 9.2   GFR: Estimated Creatinine Clearance: 70.7 mL/min (by C-G formula based on SCr of 1.21 mg/dL). Liver Function Tests: No results for input(s): AST, ALT, ALKPHOS, BILITOT, PROT, ALBUMIN in the last 168 hours. No results for input(s): LIPASE, AMYLASE in the last 168 hours. No results for input(s): AMMONIA in the last 168 hours. Coagulation Profile: No results for input(s): INR, PROTIME in the last 168 hours. Cardiac Enzymes: No results for input(s): CKTOTAL, CKMB, CKMBINDEX, TROPONINI in the last 168 hours. BNP (last 3 results) No  results for input(s): PROBNP in the last 8760 hours. HbA1C: No results for input(s): HGBA1C in the last 72 hours. CBG: No results for input(s): GLUCAP in the last 168 hours. Lipid Profile: No results for input(s): CHOL, HDL, LDLCALC, TRIG, CHOLHDL, LDLDIRECT in the last 72 hours. Thyroid Function Tests: No results for input(s): TSH, T4TOTAL, FREET4, T3FREE, THYROIDAB in the last 72 hours. Anemia Panel: No results for input(s): VITAMINB12, FOLATE, FERRITIN, TIBC, IRON, RETICCTPCT in the last 72 hours. Sepsis Labs: No results for input(s): PROCALCITON, LATICACIDVEN in the last 168 hours.  No results found for this or any previous visit (from the past 240 hour(s)).       Radiology Studies: No results found.      Scheduled Meds: .  stroke: mapping our early stages of recovery book   Does not apply Once  . aspirin  325 mg Oral Daily  . atorvastatin  20 mg Oral q1800  . enoxaparin (LOVENOX)  injection  40 mg Subcutaneous Q24H  . feeding supplement (ENSURE ENLIVE)  237 mL Oral BID BM  . folic acid  1 mg Oral Daily  . lactulose  20 g Oral BID  . metoprolol tartrate  25 mg Oral BID  . multivitamin with minerals  1 tablet Oral Daily  . omega-3 acid ethyl esters  1 g Oral Daily  . pantoprazole  40 mg Oral Daily  . thiamine  100 mg Oral Daily   Or  . thiamine  100 mg Intravenous Daily   Continuous Infusions:   LOS: 108 days    Time spent: 15 minutes with over 50% of the time coordinating the patient's care    Jae DireJared E Faelyn Sigler, DO [facility] Pager [phone]  If 7PM-7AM, please contact night-coverage [email or web address] Password [email or web address] 11/08/2019, 10:14 AM

## 2019-11-08 NOTE — TOC Progression Note (Signed)
Transition of Care [facility]) - Progression Note    Patient Details  Name: Tyler [name] MRN: [id number] Date of Birth: 01/15/1971  Transition of Care [facility]) CM/SW [place] Phone Number: 11/08/2019, 4:22 PM  Clinical Narrative:    CSW received call back from [facility] [place]. She states they do not have any availability but can send me the screening. She reports concern that patient would not be able to meet the independent unsupervised setting due to his cognitive issues. She is in agreement with CSW sending the referral anyway.    Expected Discharge Plan: Skilled Nursing Facility Barriers to Discharge: Active Substance Use - Placement, SNF Pending payor source - LOG, SNF Pending bed offer  Expected Discharge Plan and Services Expected Discharge Plan: [place]   Discharge Planning Services: CM Consult, Other - See comment(Pt will need LOG for SNF placement) Post Acute Care Choice: [place] arrangements for the past 2 months: Single Family Home                 DME Arranged: N/A           HH Agency: NA         Social Determinants of Health (SDOH) Interventions    Readmission Risk Interventions No flowsheet data found.

## 2019-11-09 DIAGNOSIS — I6349 Cerebral infarction due to embolism of other cerebral artery: Secondary | ICD-10-CM

## 2019-11-09 LAB — CBC
HCT: 40.6 % (ref 39.0–52.0)
Hemoglobin: 13.4 g/dL (ref 13.0–17.0)
MCH: 30.2 pg (ref 26.0–34.0)
MCHC: 33 g/dL (ref 30.0–36.0)
MCV: 91.4 fL (ref 80.0–100.0)
Platelets: 196 10*3/uL (ref 150–400)
RBC: 4.44 MIL/uL (ref 4.22–5.81)
RDW: 12 % (ref 11.5–15.5)
WBC: 9.6 10*3/uL (ref 4.0–10.5)
nRBC: 0 % (ref 0.0–0.2)

## 2019-11-09 LAB — BASIC METABOLIC PANEL
Anion gap: 11 (ref 5–15)
BUN: 22 mg/dL — ABNORMAL HIGH (ref 6–20)
CO2: 25 mmol/L (ref 22–32)
Calcium: 9.3 mg/dL (ref 8.9–10.3)
Chloride: 105 mmol/L (ref 98–111)
Creatinine, Ser: 1.39 mg/dL — ABNORMAL HIGH (ref 0.61–1.24)
GFR calc Af Amer: >60 mL/min (ref >60)
GFR calc non Af Amer: 59 mL/min — ABNORMAL LOW (ref >60)
Glucose, Bld: 128 mg/dL — ABNORMAL HIGH (ref 70–99)
Potassium: 3.5 mmol/L (ref 3.5–5.1)
Sodium: 141 mmol/L (ref 135–145)

## 2019-11-09 NOTE — Progress Notes (Signed)
[facility] APP - Progress Note  Tyrone AppleSteven Riemann ZOX:[id number] DOB: 03/25/1971 DOA: 07/23/2019 PCP: Patient, No Pcp Per   Brief narrative: 48 year old male with history of polysubstance abuse including heroine [facility] after being found down with a syringe nearby. He was given Narcan with minimal improvement. On presentation, he was found to have severe rhabdomyolysis with acute kidney injury and hyperkalemia. He was started on IV fluids including bicarb drip and [facility]. Rhabdomyolysis improved but he had persistence of encephalopathy and was found to have acute bilateral cerebellar infarct. Neurology was consulted and has subsequently signed off. PT recommended SNF placement. Prolonged hospitalization due to placement issues.   Patient is medically stable for discharge.  Barriers to discharge currently are patient's cognitive status secondary to his issues as below as well as lack of ability to gain guardianship at this point as he does not have any family members willing to take care of him and patient is a difficult placement at SNF or ALF.  Subjective: Patient pleasantly confused without any specific complaints verbalized  Assessment/Plan: Principal Problem:   Cerebral embolism with cerebral infarction Active Problems:   Rhabdomyolysis   Acute kidney failure (HCC)   Acute metabolic encephalopathy   Polysubstance abuse (HCC)   Hyperkalemia   Transaminitis   HTN (hypertension)   Protein-calorie malnutrition, severe   Acute bilateral ischemic CVA, nonhemorrhagic -Stable with new baseline cognitive deficits which are prohibiting his ability to live independently -MRI of the brain showed acute CVA; right brain negative -Neurology evaluated the patient and haVE signed off. -Carotid duplex within normal limits -LDL 83, A1c 5.1 -Echo showed EF of 60 to 65%. Lower extremity Dopplers: Negative for DVT -Neurology recommended  considering 30-day cardiac event monitoring as outpatient to rule out A. fib if patient neurologically improves -Continue aspirin and statin  Cognitive deficit secondary to above Per psych: Patient does not have mental capacity to make an informed decision and recommending someone assume guardianship for him.  Unfortunately this is very difficult as so far no one has been willing to assume guardianship. -Currently awaiting SNF or ALF bed -At present patient's daughter has a significant substance abuse problem and is unable to manage the care of the patient, patient's and is also unable to manage care.  At this juncture there is no one who can assist with 24/7 care of this patient  Acute metabolic encephalopathy Multifactorial: Most likely secondary CVA, drug use, uremia, heavy alcohol use Resolved -CT of the head and neck was initially negative for any traumatic finding -UDS, Tylenol and salicylate levels were negative. -TSH, folate, B12 levels all within normal limits -EEG: Generalized diffuse slowing but no seizure activity -Had required intermittent restraints initially during the hospitalization, now without requirements.  Severe rhabdomyolysis/AKI -Resolved with IV fluids. Renal ultrasound negative. Off IV fluids  Transaminitis with elevated bilirubin -Probably from alcohol abuse -Right upper quadrant ultrasound showed hepatic steatosis with early cirrhosis with some gallbladder thickening -Resolved.   Alcohol abuse -Advised to quit. Continue thiamine, multivitamin and folic acid  UTI -Present on admission. Completed 5 days of Rocephin  Essential hypertension -Stable. Continue metoprolol.  Severe protein calorie malnutrition -Follow nutrition recommendations  Abdominal pain/back pain -X-ray showed unchanged T11 compression fracture, age-indeterminate L1 fracture -Continue Voltaren gel as needed. -Resolved  Generalized deconditioning -Likely secondary to  alcoholism, CVA, prolonged hospitalization -PT, OT recommending SNF, will ask to reassess -Last PT evaluation was on 11/30: Patient's visual acuity had improved-he  continued to require reorientation and max directional verbal cues to complete tasks.  He was able to find his room with minimal cueing which was improved over the previous week.  Patient continued to have lack of insight regarding his underlying deficits and safety was documented as unsafe to return to home alone.  This was supported by patient's recent psychiatric evaluation as well.   DVT prophylaxis: Lovenox  Code Status:  Full  Family Communication:  Please see note dated 12/3 from previous attending physician.  Initially contacted patient's aunt who then proceeded to give physician number of patient's daughter.  Unfortunately this phone had been left at the residence of the patient's previous boyfriend to answer the phone and updated physician regarding patient's daughter.  Apparently patient's daughter has a significant substance abuse problem and this is the rationale for a why she unexpectedly left boyfriend who now has possession of her phone.  Boyfriend Greig Castilla stated he would like to be helpful would be unable to take care of patient full-time.  Social worker was updated by physician regarding above situation as well.  Disposition Plan/Expected LOS: Currently inpatient.  Disposition unclear noting patient unable to manage independently.  Did contact [facility]-no available beds and director of home felt patient may not be able to meet the independent unsupervised setting requirements due to his cognitive issues.  Consultants: Neurology  Procedures: Echocardiogram 1. The left ventricle has normal systolic function with an ejection fraction of 60-65%. The cavity size was normal. Left ventricular diastolic Doppler parameters are consistent with impaired relaxation. No evidence of left ventricular regional wall  motion abnormalities. 2. The right ventricle has normal systolic function. The cavity was normal. There is no increase in right ventricular wall thickness. Right ventricular systolic pressure is normal with an estimated pressure of 21.0 mmHg. 3. The aortic valve is tricuspid. Moderate sclerosis of the aortic valve. 4. The aorta is normal unless otherwise noted.  EEG This study is suggestive of moderate diffuse encephalopathy which is nonspecific to etiology . No seizures or epileptiform discharges were seen throughout the recording.  Cultures: Blood cultures on 8/17 no growth MRSA nasal PCR on 8/18 neg Urine culture on 8/19 multiple contaminants Blood cultures from 8/22 neg Repeat MRSA PCR on 9/19  neg  Antibiotics: Anti-infectives (From admission, onward)   Start     Dose/Rate Route Frequency Ordered Stop   07/25/19 0745  cefTRIAXone (ROCEPHIN) 1 g in sodium chloride 0.9 % 100 mL IVPB     1 g 200 mL/hr over 30 Minutes Intravenous Every 24 hours 07/25/19 0734 07/29/19 1518      Objective: Blood pressure 95/61, pulse 82, temperature 98.6 F (37 C), temperature source Oral, resp. rate 18, height 5\' 8"  (1.727 m), weight 73.5 kg, SpO2 97 %.  Intake/Output Summary (Last 24 hours) at 11/09/2019 1102 Last data filed at 11/09/2019 0931 Gross per 24 hour  Intake 600 ml  Output -  Net 600 ml     Exam: Gen: No acute respiratory distress Psych: Alert and oriented times name-is aware that he is in the hospital but appears to have short-term memory deficits Chest: Clear to auscultation bilaterally without wheezes, rhonchi or crackles, room air Cardiac: Regular rate and rhythm, S1-S2, no rubs murmurs or gallops, no peripheral edema, no JVD Abdomen: Soft nontender nondistended without obvious hepatosplenomegaly, no ascites Extremities: Symmetrical in appearance without cyanosis, clubbing or effusion  Scheduled Meds:  Scheduled Meds: .  stroke: mapping our early stages of recovery book  Does not apply Once  . aspirin  325 mg Oral Daily  . atorvastatin  20 mg Oral q1800  . enoxaparin (LOVENOX) injection  40 mg Subcutaneous Q24H  . feeding supplement (ENSURE ENLIVE)  237 mL Oral BID BM  . folic acid  1 mg Oral Daily  . lactulose  20 g Oral BID  . metoprolol tartrate  25 mg Oral BID  . multivitamin with minerals  1 tablet Oral Daily  . omega-3 acid ethyl esters  1 g Oral Daily  . pantoprazole  40 mg Oral Daily  . thiamine  100 mg Oral Daily   Or  . thiamine  100 mg Intravenous Daily   Continuous Infusions:  Data Reviewed: Basic Metabolic Panel: Recent Labs  Lab 11/03/19 0836 11/04/19 0752 11/09/19 0233  NA 142 140 141  K 4.4 3.9 3.5  CL 104 108 105  CO2 25 23 25   GLUCOSE 103* 88 128*  BUN 24* 22* 22*  CREATININE 1.34* 1.21 1.39*  CALCIUM 9.7 9.2 9.3   Liver Function Tests: No results for input(s): AST, ALT, ALKPHOS, BILITOT, PROT, ALBUMIN in the last 168 hours. No results for input(s): LIPASE, AMYLASE in the last 168 hours. No results for input(s): AMMONIA in the last 168 hours. CBC: Recent Labs  Lab 11/03/19 0836 11/09/19 0233  WBC 10.3 9.6  HGB 16.7 13.4  HCT 49.4 40.6  MCV 92.0 91.4  PLT 249 196   Cardiac Enzymes: No results for input(s): CKTOTAL, CKMB, CKMBINDEX, TROPONINI in the last 168 hours. BNP (last 3 results) No results for input(s): BNP in the last 8760 hours.  ProBNP (last 3 results) No results for input(s): PROBNP in the last 8760 hours.  CBG: No results for input(s): GLUCAP in the last 168 hours.  No results found for this or any previous visit (from the past 240 hour(s)).     Studies:  Recent x-ray studies have been reviewed in detail by the Attending Physician  Time spent :      Erin Hearing, [place] Office  [phone] Pager [phone]   **If unable to reach the above provider after paging please contact the [place] @ [phone]  On-Call/Text Page:      [email or web address]      password [email or web address]   If 7PM-7AM, please contact night-coverage [email or web address] Password [email or web address] 11/09/2019, 11:02 AM   LOS: 109 days

## 2019-11-09 NOTE — Progress Notes (Signed)
Occupational Therapy Treatment Patient Details Name: Tyler [name] MRN: [id number] DOB: Jan 23, 1971 Today's Date: 11/09/2019    History of present illness 48 y/o male transferred from [place] hospital for unresponsiveness, likely secondary to substance abuse. Pt with rhabdomyolysis and AKI. EEG revealed moderate diffuse encephalopathy. PMH includes polysubstance abuse and HTN. Imaging show acute bialteral cerebellar infarcts.    OT comments  Patient sleeping upon arrival however agreeable to OT. Patient demonstrates independence with self care tasks bathing, dressing, toilet transfer + managing clothing/hygiene. Patient require verbal cues for problem solving during sink side grooming how to apply disposable razor to electric razor and how to manage faucet to shower to adjust temperature and how to turn off at end of shower. Patient continues to demonstrate cognitive deficits during daily living tasks therefore continue to recommend 24/7 supervision for patient safety.    Follow Up Recommendations  SNF;Supervision/Assistance - 24 hour    Equipment Recommendations  None recommended by OT       Precautions / Restrictions Precautions Precautions: Fall Restrictions Weight Bearing Restrictions: No       Mobility Bed Mobility Overal bed mobility: Independent                Transfers Overall transfer level: Independent Equipment used: None Transfers: Sit to/from Stand Sit to Stand: Independent         General transfer comment: patient demonstrates adequate safety awareness with transfers this session    Balance Overall balance assessment: Independent                                         ADL either performed or assessed with clinical judgement   ADL       Grooming: Oral care;Standing;Supervision/safety(shaving, cuing for problem solving)   Upper Body Bathing: Independent;Standing   Lower Body Bathing: Independent;Sit to/from stand   Upper  Body Dressing : Independent;Standing   Lower Body Dressing: Independent;Sit to/from stand   Toilet Transfer: Independent;Ambulation   Toileting- Clothing Manipulation and Hygiene: Independent;Sit to/from stand   Tub/ Engineer, structural: Walk-in shower;Ambulation;Supervision/safety Tub/Shower Transfer Details (indicate cue type and reason): good safety awareness in shower, only requires cues for problem solving faucet to control temperature and turn on/off Functional mobility during ADLs: Independent General ADL Comments: patient primarily independent with self care tasks this session however does require cues for problem solving requiring intermittent supervision                Cognition Arousal/Alertness: Awake/alert Behavior During Therapy: WFL for tasks assessed/performed Overall Cognitive Status: No family/caregiver present to determine baseline cognitive functioning Area of Impairment: Problem solving                             Problem Solving: Difficulty sequencing;Requires verbal cues General Comments: patient requires verbal cues for problem solving during self care tasks how to attach disposable electric razor and how to manage shower faucet              General Comments patient requires verbal cues for problem solving for approximately 25% of treatment    Pertinent Vitals/ Pain       Pain Assessment: No/denies pain     Prior Functioning/Environment              Frequency  Min 1X/week        Progress Toward Goals  OT  Goals(current goals can now be found in the care plan section)  Progress towards OT goals: Progressing toward goals  Acute Rehab OT Goals Patient Stated Goal: to get out of here and go home OT Goal Formulation: With patient Time For Goal Achievement: 11/19/19 Potential to Achieve Goals: Good ADL Goals Pt Will Perform Grooming: Independently;standing Pt Will Perform Upper Body Bathing: Independently;sitting Pt Will Perform  Lower Body Bathing: Independently;sit to/from stand Pt Will Perform Upper Body Dressing: Independently;sitting Pt Will Perform Lower Body Dressing: Independently;sit to/from stand Pt Will Transfer to Toilet: Independently;regular height toilet Pt Will Perform Toileting - Clothing Manipulation and hygiene: Independently;sit to/from stand Pt Will Perform Tub/Shower Transfer: Independently;shower seat;ambulating;grab bars Additional ADL Goal #1: pt will identify 3/3 ADL/toiletry items and retrieve them in prep for selfcare Additional ADL Goal #2: Pt will complete trail making task with <3 VC's for safe and successful completion Additional ADL Goal #3: Pt will complete 3 step task with <3 sequencing cues  Plan Discharge plan remains appropriate       AM-PAC OT "6 Clicks" Daily Activity     Outcome Measure   Help from another person eating meals?: None Help from another person taking care of personal grooming?: A Little Help from another person toileting, which includes using toliet, bedpan, or urinal?: None Help from another person bathing (including washing, rinsing, drying)?: A Little Help from another person to put on and taking off regular upper body clothing?: None Help from another person to put on and taking off regular lower body clothing?: None 6 Click Score: 22    End of Session    OT Visit Diagnosis: Other symptoms and signs involving cognitive function   Activity Tolerance Patient tolerated treatment well   Patient Left in bed;with call bell/phone within reach   Nurse Communication Mobility status        Time: 0920-0959 OT Time Calculation (min): 39 min  Charges: OT General Charges $OT Visit: 1 Visit OT Treatments $Self Care/Home Management : 38-52 mins  Tyler [name] OT OT office: [place] 11/09/2019, 1:00 PM

## 2019-11-09 NOTE — Progress Notes (Signed)
Patient reused bed alarm.

## 2019-11-10 LAB — BASIC METABOLIC PANEL
Anion gap: 9 (ref 5–15)
BUN: 22 mg/dL — ABNORMAL HIGH (ref 6–20)
CO2: 25 mmol/L (ref 22–32)
Calcium: 9.5 mg/dL (ref 8.9–10.3)
Chloride: 107 mmol/L (ref 98–111)
Creatinine, Ser: 1.12 mg/dL (ref 0.61–1.24)
GFR calc Af Amer: >60 mL/min (ref >60)
GFR calc non Af Amer: >60 mL/min (ref >60)
Glucose, Bld: 94 mg/dL (ref 70–99)
Potassium: 3.8 mmol/L (ref 3.5–5.1)
Sodium: 141 mmol/L (ref 135–145)

## 2019-11-10 NOTE — Progress Notes (Signed)
[name] APP - Progress Note  Tyler AppleSteven [name] WUJ:[id number] DOB: 03/31/1971 DOA: 07/23/2019 PCP: Patient, No Pcp Per   Brief narrative: 48 year old male with history of polysubstance abuse including heroine presented to [facility] ER after being found down with a syringe nearby. He was given Narcan with minimal improvement. On presentation, he was found to have severe rhabdomyolysis with acute kidney injury and hyperkalemia. He was started on IV fluids including bicarb drip and transferred to [name]. Rhabdomyolysis improved but he had persistence of encephalopathy and was found to have acute bilateral cerebellar infarct. Neurology was consulted and has subsequently signed off. PT recommended SNF placement. Prolonged hospitalization due to placement issues.   Patient is medically stable for discharge.  Barriers to discharge currently are patient's cognitive status secondary to his issues as below as well as lack of ability to gain guardianship at this point as he does not have any family members willing to take care of him and patient is a difficult placement at SNF or ALF.  Subjective: Patient remains pleasantly confused. Reports that his appetite is good. Offers no complaints.   Assessment/Plan: Principal Problem:   Cerebral embolism with cerebral infarction Active Problems:   Rhabdomyolysis   Acute kidney failure (HCC)   Acute metabolic encephalopathy   Polysubstance abuse (HCC)   Hyperkalemia   Transaminitis   HTN (hypertension)   Protein-calorie malnutrition, severe   Acute bilateral ischemic CVA, nonhemorrhagic -Stable with new baseline cognitive deficits which are prohibiting his ability to live independently -MRI of the brain showed acute CVA; right brain negative -Neurology evaluated the patient and haVE signed off. -Carotid duplex within normal limits -LDL 83, A1c 5.1 -Echo showed EF of 60 to 65%. Lower extremity Dopplers: Negative for  DVT -Neurology recommended considering 30-day cardiac event monitoring as outpatient to rule out A. fib if patient neurologically improves -Continue aspirin and statin  Cognitive deficit secondary to above Per psych: Patient does not have mental capacity to make an informed decision and recommending someone assume guardianship for him.  Unfortunately this is very difficult as so far no one has been willing to assume guardianship. -Currently awaiting SNF or ALF bed -At present patient's daughter has a significant substance abuse problem and is unable to manage the care of the patient, patient's and is also unable to manage care.  At this juncture there is no one who can assist with 24/7 care of this patient  Acute metabolic encephalopathy Multifactorial: Most likely secondary CVA, drug use, uremia, heavy alcohol use Resolved -CT of the head and neck was initially negative for any traumatic finding -UDS, Tylenol and salicylate levels were negative. -TSH, folate, B12 levels all within normal limits -EEG: Generalized diffuse slowing but no seizure activity -Had required intermittent restraints initially during the hospitalization, now without requirements.  Severe rhabdomyolysis/AKI -Resolved with IV fluids. Renal ultrasound negative. Remains off IV fluids with stable UOP. Monitor Scr intermittently.   Transaminitis with elevated bilirubin -Probably from alcohol abuse -Right upper quadrant ultrasound showed hepatic steatosis with early cirrhosis with some gallbladder thickening -Resolved.   Alcohol abuse -Advised to quit. Continue thiamine, multivitamin and folic acid  UTI -Present on admission. Completed 5 days of Rocephin  Essential hypertension -Stable. Continue metoprolol.  Severe protein calorie malnutrition -Follow nutrition recommendations  Abdominal pain/back pain -X-ray showed unchanged T11 compression fracture, age-indeterminate L1 fracture -Continue  Voltaren gel as needed. -Resolved  Generalized deconditioning -Likely secondary to alcoholism, CVA, prolonged hospitalization -PT, OT recommending SNF, will  ask to reassess -Last PT evaluation was on 11/30: Patient's visual acuity had improved-he continued to require reorientation and max directional verbal cues to complete tasks.  He was able to find his room with minimal cueing which was improved over the previous week.  Patient continued to have lack of insight regarding his underlying deficits and safety was documented as unsafe to return to home alone.  This was supported by patient's recent psychiatric evaluation as well.   DVT prophylaxis: Lovenox  Code Status:  Full  Family Communication:  Please see note dated 12/3 from previous attending physician.  Initially contacted patient's aunt who then proceeded to give physician number of patient's daughter.  Unfortunately this phone had been left at the residence of the patient's previous boyfriend to answer the phone and updated physician regarding patient's daughter.  Apparently patient's daughter has a significant substance abuse problem and this is the rationale for why she unexpectedly left boyfriend who now has possession of her phone.  Boyfriend Tyler [name] stated he would like to be helpful but would be unable to take care of patient full-time.  Social worker was updated by physician regarding above situation as well.  Disposition Plan/Expected LOS: Currently inpatient.  Disposition unclear noting patient unable to manage independently.  [facility] has been contacted but they have beds available and director of home felt patient may not be able to meet the independent unsupervised setting requirements due to his cognitive issues.  Consultants: Neurology  Procedures: Echocardiogram 1. The left ventricle has normal systolic function with an ejection fraction of 60-65%. The cavity size was normal. Left ventricular diastolic  Doppler parameters are consistent with impaired relaxation. No evidence of left ventricular regional wall motion abnormalities. 2. The right ventricle has normal systolic function. The cavity was normal. There is no increase in right ventricular wall thickness. Right ventricular systolic pressure is normal with an estimated pressure of 21.0 mmHg. 3. The aortic valve is tricuspid. Moderate sclerosis of the aortic valve. 4. The aorta is normal unless otherwise noted.  EEG This study is suggestive of moderate diffuse encephalopathy which is nonspecific to etiology . No seizures or epileptiform discharges were seen throughout the recording.  Cultures: Blood cultures on 8/17 no growth MRSA nasal PCR on 8/18 neg Urine culture on 8/19 multiple contaminants Blood cultures from 8/22 neg Repeat MRSA PCR on 9/19  neg  Antibiotics: Anti-infectives (From admission, onward)   Start     Dose/Rate Route Frequency Ordered Stop   07/25/19 0745  cefTRIAXone (ROCEPHIN) 1 g in sodium chloride 0.9 % 100 mL IVPB     1 g 200 mL/hr over 30 Minutes Intravenous Every 24 hours 07/25/19 0734 07/29/19 1518      Objective: Blood pressure 95/75, pulse 79, temperature 97.7 F (36.5 C), temperature source Oral, resp. rate 18, height 5\' 8"  (1.727 m), weight 73.4 kg, SpO2 98 %.  Intake/Output Summary (Last 24 hours) at 11/10/2019 1616 Last data filed at 11/10/2019 0900 Gross per 24 hour  Intake 480 ml  Output 1 ml  Net 479 ml     Exam: Gen: No acute respiratory distress Psych: Alert and oriented times name-is aware that he is in the hospital but appears to have short-term memory deficits Chest: no respiratory distress. No cough Cardiac: Regular rate and rhythm, S1-S2, no edema.  Abdomen: Soft nontender nondistended  Extremities: Symmetrical in appearance without cyanosis, clubbing or effusion  Scheduled Meds:  Scheduled Meds:   stroke: mapping our early stages of recovery book  Does not apply Once    aspirin  325 mg Oral Daily   atorvastatin  20 mg Oral q1800   enoxaparin (LOVENOX) injection  40 mg Subcutaneous Q24H   feeding supplement (ENSURE ENLIVE)  237 mL Oral BID BM   folic acid  1 mg Oral Daily   lactulose  20 g Oral BID   metoprolol tartrate  25 mg Oral BID   multivitamin with minerals  1 tablet Oral Daily   omega-3 acid ethyl esters  1 g Oral Daily   pantoprazole  40 mg Oral Daily   thiamine  100 mg Oral Daily   Or   thiamine  100 mg Intravenous Daily   Continuous Infusions:  Data Reviewed: Basic Metabolic Panel: Recent Labs  Lab 11/04/19 0752 11/09/19 0233 11/10/19 0003  NA 140 141 141  K 3.9 3.5 3.8  CL 108 105 107  CO2 23 25 25   GLUCOSE 88 128* 94  BUN 22* 22* 22*  CREATININE 1.21 1.39* 1.12  CALCIUM 9.2 9.3 9.5   Liver Function Tests: No results for input(s): AST, ALT, ALKPHOS, BILITOT, PROT, ALBUMIN in the last 168 hours. No results for input(s): LIPASE, AMYLASE in the last 168 hours. No results for input(s): AMMONIA in the last 168 hours. CBC: Recent Labs  Lab 11/09/19 0233  WBC 9.6  HGB 13.4  HCT 40.6  MCV 91.4  PLT 196   Cardiac Enzymes: No results for input(s): CKTOTAL, CKMB, CKMBINDEX, TROPONINI in the last 168 hours. BNP (last 3 results) No results for input(s): BNP in the last 8760 hours.  ProBNP (last 3 results) No results for input(s): PROBNP in the last 8760 hours.  CBG: No results for input(s): GLUCAP in the last 168 hours.  No results found for this or any previous visit (from the past 240 hour(s)).     Blain Pais, MD [facility]   **If unable to reach the above provider after paging please contact the Flow Manager @ [phone]   If 7PM-7AM, please contact night-coverage [email or web address] Password [email or web address] 11/10/2019, 4:16 PM   LOS: 110 days

## 2019-11-11 MED ORDER — HYDRALAZINE HCL 10 MG PO TABS: 10.0000 mg | ORAL_TABLET | Freq: Four times a day (QID) | ORAL | Status: DC | PRN

## 2019-11-11 MED ORDER — HALOPERIDOL LACTATE 5 MG/ML IJ SOLN: 2.0000 mg | Freq: Four times a day (QID) | INTRAMUSCULAR | Status: DC | PRN

## 2019-11-11 NOTE — Progress Notes (Signed)
[facility] APP - Progress Note  Jaysion Ramseyer ONG:[id number] DOB: 10/30/1971 DOA: 07/23/2019 PCP: Patient, No Pcp Per   Brief narrative: 48 year old male with history of polysubstance abuse including heroine presented to [facility] ER after being found down with a syringe nearby. He was given Narcan with minimal improvement. On presentation, he was found to have severe rhabdomyolysis with acute kidney injury and hyperkalemia. He was started on IV fluids including bicarb drip and transferred to [facility] for further care. Rhabdomyolysis improved but he had persistence of encephalopathy and was found to have acute bilateral cerebellar infarct. Neurology was consulted and has subsequently signed off. PT recommended SNF placement. Prolonged hospitalization due to placement issues.   Patient is medically stable for discharge.  Barriers to discharge currently are patient's cognitive status secondary to his issues as below as well as lack of ability to gain guardianship at this point as he does not have any family members willing to take care of him and patient is a difficult placement at SNF or ALF.  Subjective: Patient remains pleasantly confused. Reports that his appetite is good. Offers no complaints.   Assessment/Plan: Principal Problem:   Cerebral embolism with cerebral infarction Active Problems:   Rhabdomyolysis   Acute kidney failure (HCC)   Acute metabolic encephalopathy   Polysubstance abuse (HCC)   Hyperkalemia   Transaminitis   HTN (hypertension)   Protein-calorie malnutrition, severe   Acute bilateral ischemic CVA, nonhemorrhagic -Stable with new baseline cognitive deficits which are prohibiting his ability to live independently -MRI of the brain showed acute CVA; right brain negative -Neurology evaluated the patient and haVE signed off. -Carotid duplex within normal limits -LDL 83, A1c 5.1 -Echo showed EF of 60 to 65%. Lower extremity Dopplers: Negative for  DVT -Neurology recommended considering 30-day cardiac event monitoring as outpatient to rule out A. fib if patient neurologically improves -Continue aspirin and statin  Cognitive deficit secondary to above Per psych: Patient does not have mental capacity to make an informed decision and recommending someone assume guardianship for him.  Unfortunately this is very difficult as so far no one has been willing to assume guardianship. -Currently awaiting SNF or ALF bed -At present patient's daughter has a significant substance abuse problem and is unable to manage the care of the patient, patient's and is also unable to manage care.  At this juncture there is no one who can assist with 24/7 care of this patient  Acute metabolic encephalopathy Multifactorial: Most likely secondary CVA, drug use, uremia, heavy alcohol use Resolved -CT of the head and neck was initially negative for any traumatic finding -UDS, Tylenol and salicylate levels were negative. -TSH, folate, B12 levels all within normal limits -EEG: Generalized diffuse slowing but no seizure activity -Had required intermittent restraints initially during the hospitalization, now without requirements.  Severe rhabdomyolysis/AKI -Resolved with IV fluids. Renal ultrasound negative. Remains off IV fluids with stable UOP. Monitor Scr intermittently.   Transaminitis with elevated bilirubin -Probably from alcohol abuse -Right upper quadrant ultrasound showed hepatic steatosis with early cirrhosis with some gallbladder thickening -Resolved.   Alcohol abuse -Advised to quit. Continue thiamine, multivitamin and folic acid.  -No signs of withdrawal at this time.   UTI -Present on admission. Completed 5 days of Rocephin  Essential hypertension -Stable. Continue metoprolol.  Severe protein calorie malnutrition -Follow nutrition recommendations  Abdominal pain/back pain -X-ray showed unchanged T11 compression fracture,  age-indeterminate L1 fracture -Continue Voltaren gel as needed. -Resolved  Generalized deconditioning -Likely secondary to  alcoholism, CVA, prolonged hospitalization -PT, OT recommending SNF, will ask to reassess -Last PT evaluation was on 11/30: Patient's visual acuity had improved-he continued to require reorientation and max directional verbal cues to complete tasks.  He was able to find his room with minimal cueing which was improved over the previous week.  Patient continued to have lack of insight regarding his underlying deficits and safety was documented as unsafe to return to home alone.  This was supported by patient's recent psychiatric evaluation as well.   DVT prophylaxis: Lovenox  Code Status:  Full  Family Communication:  Please see note dated 12/3 from previous attending physician.  Initially contacted patient's aunt who then proceeded to give physician number of patient's daughter.  Unfortunately this phone had been left at the residence of the patient's previous boyfriend to answer the phone and updated physician regarding patient's daughter.  Apparently patient's daughter has a significant substance abuse problem and this is the rationale for why she unexpectedly left boyfriend who now has possession of her phone.  Boyfriend Greig Castillandrew stated he would like to be helpful but would be unable to take care of patient full-time.  Social worker was updated by physician regarding above situation as well.  Disposition Plan/Expected LOS: Currently inpatient.  Disposition unclear noting patient unable to manage independently.  [facility] but they have beds available and director of home felt patient may not be able to meet the independent unsupervised setting requirements due to his cognitive issues.  Consultants: Neurology  Procedures: Echocardiogram 1. The left ventricle has normal systolic function with an ejection fraction of 60-65%. The cavity size was  normal. Left ventricular diastolic Doppler parameters are consistent with impaired relaxation. No evidence of left ventricular regional wall motion abnormalities. 2. The right ventricle has normal systolic function. The cavity was normal. There is no increase in right ventricular wall thickness. Right ventricular systolic pressure is normal with an estimated pressure of 21.0 mmHg. 3. The aortic valve is tricuspid. Moderate sclerosis of the aortic valve. 4. The aorta is normal unless otherwise noted.  EEG This study is suggestive of moderate diffuse encephalopathy which is nonspecific to etiology . No seizures or epileptiform discharges were seen throughout the recording.  Cultures: Blood cultures on 8/17 no growth MRSA nasal PCR on 8/18 neg Urine culture on 8/19 multiple contaminants Blood cultures from 8/22 neg Repeat MRSA PCR on 9/19  neg  Antibiotics: Anti-infectives (From admission, onward)   Start     Dose/Rate Route Frequency Ordered Stop   07/25/19 0745  cefTRIAXone (ROCEPHIN) 1 g in sodium chloride 0.9 % 100 mL IVPB     1 g 200 mL/hr over 30 Minutes Intravenous Every 24 hours 07/25/19 0734 07/29/19 1518      Objective: Blood pressure (!) 112/93, pulse 74, temperature 98.3 F (36.8 C), temperature source Oral, resp. rate 18, height 5\' 8"  (1.727 m), weight 68.6 kg, SpO2 96 %.  Intake/Output Summary (Last 24 hours) at 11/11/2019 1402 Last data filed at 11/11/2019 0900 Gross per 24 hour  Intake 720 ml  Output 0 ml  Net 720 ml     Exam: Gen: No acute respiratory distress Psych: Alert and oriented times name-is aware that he is in the hospital but appears to have short-term memory deficits.  Chest: no respiratory distress. No cough Cardiac: Regular rate and rhythm, S1-S2, no edema.  Abdomen: Soft nontender nondistended  Extremities: Symmetrical in appearance without cyanosis, clubbing or effusion  Scheduled Meds:  Scheduled  Meds:   stroke: mapping our early stages of  recovery book   Does not apply Once   aspirin  325 mg Oral Daily   atorvastatin  20 mg Oral q1800   enoxaparin (LOVENOX) injection  40 mg Subcutaneous Q24H   feeding supplement (ENSURE ENLIVE)  237 mL Oral BID BM   folic acid  1 mg Oral Daily   lactulose  20 g Oral BID   metoprolol tartrate  25 mg Oral BID   multivitamin with minerals  1 tablet Oral Daily   omega-3 acid ethyl esters  1 g Oral Daily   pantoprazole  40 mg Oral Daily   thiamine  100 mg Oral Daily   Or   thiamine  100 mg Intravenous Daily   Continuous Infusions:  Data Reviewed: Basic Metabolic Panel: Recent Labs  Lab 11/09/19 0233 11/10/19 0003  NA 141 141  K 3.5 3.8  CL 105 107  CO2 25 25  GLUCOSE 128* 94  BUN 22* 22*  CREATININE 1.39* 1.12  CALCIUM 9.3 9.5   Liver Function Tests: No results for input(s): AST, ALT, ALKPHOS, BILITOT, PROT, ALBUMIN in the last 168 hours. No results for input(s): LIPASE, AMYLASE in the last 168 hours. No results for input(s): AMMONIA in the last 168 hours. CBC: Recent Labs  Lab 11/09/19 0233  WBC 9.6  HGB 13.4  HCT 40.6  MCV 91.4  PLT 196   Cardiac Enzymes: No results for input(s): CKTOTAL, CKMB, CKMBINDEX, TROPONINI in the last 168 hours. BNP (last 3 results) No results for input(s): BNP in the last 8760 hours.  ProBNP (last 3 results) No results for input(s): PROBNP in the last 8760 hours.  CBG: No results for input(s): GLUCAP in the last 168 hours.  No results found for this or any previous visit (from the past 240 hour(s)).   Time spent: 15 minutes  Ky Barban, MD [facility]   **If unable to reach the above provider after paging please contact the Flow Manager @ [phone]   If 7PM-7AM, please contact night-coverage [email or web address] Password [email or web address] 11/11/2019, 2:02 PM   LOS: 111 days

## 2019-11-11 NOTE — Progress Notes (Signed)
Patients BP elevated asked if he wanted some medications for it and he declined.  Advised hospitalist

## 2019-11-11 NOTE — [place] FL2 (Signed)
[employer] MEDICAID FL2 LEVEL OF CARE SCREENING TOOL     IDENTIFICATION  Patient Name: Tyler [name] Birthdate: May 27, 1971 Sex: male Admission Date (Current Location): 07/23/2019  [place] and [place] Number:  Haynes Bast [id number] M Facility and Address:  [facility], [address], [place], [place] [address]      Provider Number: [id number]  Attending Physician Name and Address:  Ky Barban, MD  Relative Name and Phone Number:  [place] Churchill, Daughter, [phone]    Current Level of Care: Hospital Recommended Level of Care: Assisted Living Facility Prior Approval Number:    Date Approved/Denied:   PASRR Number: [id number]  Discharge Plan: Domiciliary (Rest home)    Current Diagnoses: Patient Active Problem List   Diagnosis Date Noted  . Protein-calorie malnutrition, severe 08/09/2019  . Cerebral embolism with cerebral infarction 07/28/2019  . Rhabdomyolysis 07/23/2019  . Acute kidney failure (HCC) 07/23/2019  . Acute metabolic encephalopathy 07/23/2019  . Polysubstance abuse (HCC) 07/23/2019  . Hyperkalemia 07/23/2019  . Transaminitis 07/23/2019  . HTN (hypertension) 07/23/2019  . Fall 04/21/2012  . Traumatic subdural hematoma (HCC) 04/21/2012  . ICC 04/21/2012  . Left orbit fracture (HCC) 04/21/2012  . Left maxillary fracture (HCC) 04/21/2012  . Alcohol use 04/21/2012  . Cocaine use 04/21/2012    Orientation RESPIRATION BLADDER Height & Weight     Self, Place  Normal Continent Weight: 151 lb 3.8 oz (68.6 kg) Height:  5\' 8"  (172.7 cm)  BEHAVIORAL SYMPTOMS/MOOD NEUROLOGICAL BOWEL NUTRITION STATUS  Wanderer   Continent Diet(Dys 3 diet, thin liquids)  AMBULATORY STATUS COMMUNICATION OF NEEDS Skin   Supervision Verbally Normal                       Personal Care Assistance Level of Assistance  Bathing, Feeding, Dressing, Total care Bathing Assistance: Limited assistance(Minimal assistance) Feeding assistance:  Independent Dressing Assistance: Limited assistance(Minimal assistance)     Functional Limitations Info  Sight, Speech, Hearing Sight Info: Impaired Hearing Info: Adequate Speech Info: Adequate    SPECIAL CARE FACTORS FREQUENCY  PT (By licensed PT), OT (By licensed OT), Speech therapy     PT Frequency: 1x/wk OT Frequency: 1x/wk     Speech Therapy Frequency: 1x-2x/wk      Contractures Contractures Info: Not present    Additional Factors Info  Code Status, Allergies Code Status Info: Full Code Allergies Info: No Known Allergies   Insulin Sliding Scale Info: See dc summary for dose       Current Medications (11/11/2019):  This is the current hospital active medication list Current Facility-Administered Medications  Medication Dose Route Frequency Provider Last Rate Last Dose  .  stroke: mapping our early stages of recovery book   Does not apply Once Amin, 14/05/2019, MD      . acetaminophen (TYLENOL) tablet 650 mg  650 mg Oral Q4H PRN Loura Halt T, NP   650 mg at 10/20/19 1818   Or  . acetaminophen (TYLENOL) suppository 650 mg  650 mg Rectal Q6H PRN Blount, 10/22/19, NP      . alum & mag hydroxide-simeth (MAALOX/MYLANTA) 200-200-20 MG/5ML suspension 30 mL  30 mL Oral Q4H PRN Amin, Ankit Chirag, MD      . aspirin tablet 325 mg  325 mg Oral Daily Amin, Ankit Chirag, MD   325 mg at 11/09/19 1131  . atorvastatin (LIPITOR) tablet 20 mg  20 mg Oral q1800 Kyle, Tyrone A, DO   20 mg at 11/09/19 1730  .  diclofenac sodium (VOLTAREN) 1 % transdermal gel 2 g  2 g Topical TID PRN Harold Hedge, MD      . enoxaparin (LOVENOX) injection 40 mg  40 mg Subcutaneous Q24H Alma Friendly, MD   40 mg at 11/08/19 1211  . feeding supplement (ENSURE ENLIVE) (ENSURE ENLIVE) liquid 237 mL  237 mL Oral BID BM Alekh, Kshitiz, MD   237 mL at 11/09/19 1301  . folic acid (FOLVITE) tablet 1 mg  1 mg Oral Daily Starla Link, Kshitiz, MD   1 mg at 11/09/19 1132  . guaiFENesin-dextromethorphan (ROBITUSSIN  DM) 100-10 MG/5ML syrup 5 mL  5 mL Oral Q4H PRN Amin, Ankit Chirag, MD   5 mL at 10/22/19 1052  . haloperidol lactate (HALDOL) injection 2-5 mg  2-5 mg Intravenous Q6H PRN Alekh, Kshitiz, MD       Or  . haloperidol lactate (HALDOL) injection 2-5 mg  2-5 mg Intramuscular Q6H PRN Alekh, Kshitiz, MD      . hydrALAZINE (APRESOLINE) injection 10 mg  10 mg Intravenous Q6H PRN Bodenheimer, Charles A, NP   10 mg at 07/31/19 0414  . hydrocortisone (ANUSOL-HC) 2.5 % rectal cream 1 application  1 application Topical QID PRN Amin, Ankit Chirag, MD      . hydrocortisone cream 1 % 1 application  1 application Topical TID PRN Amin, Ankit Chirag, MD      . ipratropium-albuterol (DUONEB) 0.5-2.5 (3) MG/3ML nebulizer solution 3 mL  3 mL Nebulization Q4H PRN Amin, Ankit Chirag, MD      . lactulose (CHRONULAC) 10 GM/15ML solution 20 g  20 g Oral BID Amin, Ankit Chirag, MD   20 g at 11/09/19 2259  . metoprolol tartrate (LOPRESSOR) tablet 25 mg  25 mg Oral BID Gardiner Barefoot, NP   25 mg at 11/09/19 2259  . multivitamin with minerals tablet 1 tablet  1 tablet Oral Daily Alma Friendly, MD   1 tablet at 11/09/19 1131  . Muscle Rub CREA 1 application  1 application Topical PRN Damita Lack, MD   1 application at 40/81/44 0955  . omega-3 acid ethyl esters (LOVAZA) capsule 1 g  1 g Oral Daily Guilford Shi, MD   1 g at 11/09/19 1132  . ondansetron (ZOFRAN) tablet 4 mg  4 mg Oral Q6H PRN Etta Quill, DO   4 mg at 09/13/19 2113   Or  . ondansetron (ZOFRAN) injection 4 mg  4 mg Intravenous Q6H PRN Etta Quill, DO      . pantoprazole (PROTONIX) EC tablet 40 mg  40 mg Oral Daily Regalado, Belkys A, MD   40 mg at 11/09/19 1131  . phenol (CHLORASEPTIC) mouth spray 1 spray  1 spray Mouth/Throat PRN Amin, Ankit Chirag, MD      . polyethylene glycol (MIRALAX / GLYCOLAX) packet 17 g  17 g Oral Daily PRN Amin, Ankit Chirag, MD      . polyvinyl alcohol (LIQUIFILM TEARS) 1.4 % ophthalmic solution 1 drop   1 drop Both Eyes PRN Amin, Ankit Chirag, MD      . senna-docusate (Senokot-S) tablet 2 tablet  2 tablet Oral QHS PRN Amin, Ankit Chirag, MD      . sodium chloride (OCEAN) 0.65 % nasal spray 1 spray  1 spray Each Nare PRN Amin, Ankit Chirag, MD      . sodium chloride flush (NS) 0.9 % injection 10-40 mL  10-40 mL Intracatheter PRN Amin, Jeanella Flattery, MD      .  thiamine (VITAMIN B-1) tablet 100 mg  100 mg Oral Daily Lyda PeroneGardner, Jared M, DO   100 mg at 11/09/19 1131   Or  . thiamine (B-1) injection 100 mg  100 mg Intravenous Daily Lyda PeroneGardner, Jared M, DO   100 mg at 11/01/19 1114  . traMADol (ULTRAM) tablet 50 mg  50 mg Oral Q6H PRN Edsel PetrinMikhail, Maryann, DO   50 mg at 10/21/19 1700     Discharge Medications: Please see discharge summary for a list of discharge medications.  Relevant Imaging Results:  Relevant Lab Results:   Additional Information SSN: [id number]; COVID negative 07/23/2019  Justo Hengel B Caidyn Blossom, LCSWA

## 2019-11-12 NOTE — Progress Notes (Signed)
PROGRESS NOTE  Vishaal Strollo  HBZ:[id number] DOB: 1971/05/07 DOA: 07/23/2019 PCP: Patient, No Pcp Per   Brief Narrative: Gerson Fauth is a 48 y.o. male with a history of polysubstance abuse who presented to [facility] ER after being found down with a syringe nearby. He was given Narcan with minimal improvement. On presentation, he was found to have severe rhabdomyolysis with acute kidney injury and hyperkalemia. He was started on IV fluids including bicarb drip and transferred to [facility] for further care. Rhabdomyolysis improved but he had persistence of encephalopathy and was found to have acute bilateral cerebellar infarct. Neurology was consulted and has subsequently signed off. PT recommended SNF placement. Prolonged hospitalization due to placement issues.   Patient is medically stable for discharge. Barriers to discharge currently are patient's cognitive status secondary to his issues as below as well as lack of ability to gain guardianship at this point as he does not have any family members willing to take care of him and patient is a difficult placement at SNF or ALF.  Assessment & Plan: Principal Problem:   Cerebral embolism with cerebral infarction Active Problems:   Rhabdomyolysis   Acute kidney failure (HCC)   Acute metabolic encephalopathy   Polysubstance abuse (HCC)   Hyperkalemia   Transaminitis   HTN (hypertension)   Protein-calorie malnutrition, severe  Acute bilateral ischemic CVA, nonhemorrhagic: Neurology has signed off. Carotid duplex wnl, LDL 83, A1c 5.1%. No cardioembolic source on echocardiogram (LVEF 60 - 65%). Negative LE dopplers - Stable with new baseline cognitive deficits which are prohibiting his ability to live independently - Continue ASA, statin, risk factor control.  - Neurology recommended considering 30-day cardiac event monitoring as outpatient to rule out A. fib if patient neurologically improves  Cognitive deficit secondary to above:  Per psych: Patient does not have mental capacity to make an informed decision and recommending someone assume guardianship for him. Unfortunately this is very difficult as so far no one has been willing to assume guardianship. - At present patient's daughter has a significant substance abuse problem and is unable to manage the care of the patient, patient's son is also unable to manage care.  At this juncture there is no one who can assist with 24/7 care of this patient. 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 CT of the head and neck was initially negative for any traumatic finding. UDS, Tylenol and salicylate levels were negative. TSH, folate, B12 levels all within normal limits. EEG: Generalized diffuse slowing but no seizure activity -Had required intermittent restraints initially during the hospitalization, now without requirements. Continue monitoring.  Severe rhabdomyolysis/AKI: Resolved with IV fluids. Renal ultrasound negative.  - Remains off IV fluids with stable UOP. Monitor Scr intermittently.   Transaminitis with elevated bilirubin: Probably from alcohol abuse. Right upper quadrant ultrasound showed hepatic steatosis with early cirrhosis with some gallbladder thickening - Resolved.   Alcohol abuse - Advised to quit.  - Continue thiamine, multivitamin and folic acid  UTI: Present on admission. Completed 5 days of Rocephin  Essential hypertension: Stable.  -Continue metoprolol. Refuses medications   Severe protein calorie malnutrition - Follow nutrition recommendations  Abdominal pain/back pain: Resolved. X-ray showed unchanged T11 compression fracture, age-indeterminate L1 fracture - Continue Voltaren gel as needed.  Generalized deconditioning: Likely secondary to alcoholism, CVA, prolonged hospitalization  -PT, OT recommending SNF -Last PT evaluation was on 11/30: Patient's  visual acuity had improved-he continued to require reorientation and max directional verbal cues to complete tasks.  He was able to find his room with minimal cueing which was improved over the previous week.  Patient continued to have lack of insight regarding his underlying deficits and safety was documented as unsafe to return to home alone.  This was supported by patient's recent psychiatric evaluation as well  DVT prophylaxis: Lovenox Code Status: Full Family Communication: None available. Please see note dated 12/3 from previous attending physician.  Initially contacted patient's aunt who then proceeded to give physician number of patient's daughter.  Unfortunately this phone had been left at the residence of the patient's previous boyfriend to answer the phone and updated physician regarding patient's daughter.  Apparently patient's daughter has a significant substance abuse problem and this is the rationale for why she unexpectedly left boyfriend who now has possession of her phone.  Boyfriend Greig Castilla stated he would like to be helpful but would be unable to take care of patient full-time.  Social worker was updated by physician regarding above situation as well. Disposition Plan: Uncertain. D/w CSW who will send out bed requests to ALF and SNFs  Consultants:   Psychiatry  Procedures:  Echocardiogram 1. The left ventricle has normal systolic function with an ejection fraction of 60-65%. The cavity size was normal. Left ventricular diastolic Doppler parameters are consistent with impaired relaxation. No evidence of left ventricular regional wall motion abnormalities. 2. The right ventricle has normal systolic function. The cavity was normal. There is no increase in right ventricular wall thickness. Right ventricular systolic pressure is normal with an estimated pressure of 21.0 mmHg. 3. The aortic valve is tricuspid. Moderate  sclerosis of the aortic valve. 4. The aorta is normal unless otherwise noted.  EEG This study issuggestive of moderate diffuse encephalopathy which is nonspecific to etiology. No seizures or epileptiform discharges were seen throughout the recording.  Cultures: Blood cultures on 8/17 no growth MRSA nasal PCR on 8/18 neg Urine culture on 8/19 multiple contaminants Blood cultures from 8/22 neg Repeat MRSA PCR on 9/19  neg  Antimicrobials:  Ceftriaxone 8/19 - 8/23  Subjective: Feels well, denies any complaints except he wants to go home. Eating ok. No pain.   Objective: Vitals:   11/11/19 1704 11/11/19 2020 11/12/19 0437 11/12/19 0940  BP: (!) 182/128 (!) 137/91 (!) 115/97 94/72  Pulse: 71 64 79 68  Resp: 18 12 12 14   Temp: 98.7 F (37.1 C) 98.2 F (36.8 C) 97.8 F (36.6 C) 98.8 F (37.1 C)  TempSrc: Oral Oral Oral Oral  SpO2: 97% 97% 98% 96%  Weight:   69.4 kg   Height:        Intake/Output Summary (Last 24 hours) at 11/12/2019 1413 Last data filed at 11/12/2019 0700 Gross per 24 hour  Intake 840 ml  Output 0 ml  Net 840 ml   Filed Weights   11/09/19 2143 11/11/19 0449 11/12/19 0437  Weight: 73.4 kg 68.6 kg 69.4 kg    Gen: 48 y.o. male in no distress  Pulm: Non-labored breathing room air. Clear to auscultation bilaterally.  CV: Regular rate and rhythm. No murmur, rub, or gallop. No JVD, no pedal edema. GI: Abdomen soft, non-tender, non-distended, with normoactive bowel sounds. No organomegaly or masses felt. Neuro: Alert and incompletely oriented, distractable, impaired registration and short term recall. No aphasia. Psych: Judgement and insight appear impaired as above  Data Reviewed: I have personally reviewed following labs and imaging studies  CBC: Recent Labs  Lab 11/09/19 0233  WBC 9.6  HGB 13.4  HCT 40.6  MCV 91.4  PLT 196   Basic Metabolic Panel: Recent Labs  Lab 11/09/19 0233 11/10/19 0003  NA 141 141  K 3.5 3.8  CL 105 107  CO2 25  25  GLUCOSE 128* 94  BUN 22* 22*  CREATININE 1.39* 1.12  CALCIUM 9.3 9.5   GFR: Estimated Creatinine Clearance: 76.3 mL/min (by C-G formula based on SCr of 1.12 mg/dL). Liver Function Tests: No results for input(s): AST, ALT, ALKPHOS, BILITOT, PROT, ALBUMIN in the last 168 hours. No results for input(s): LIPASE, AMYLASE in the last 168 hours. No results for input(s): AMMONIA in the last 168 hours. Coagulation Profile: No results for input(s): INR, PROTIME in the last 168 hours. Cardiac Enzymes: No results for input(s): CKTOTAL, CKMB, CKMBINDEX, TROPONINI in the last 168 hours. BNP (last 3 results) No results for input(s): PROBNP in the last 8760 hours. HbA1C: No results for input(s): HGBA1C in the last 72 hours. CBG: No results for input(s): GLUCAP in the last 168 hours. Lipid Profile: No results for input(s): CHOL, HDL, LDLCALC, TRIG, CHOLHDL, LDLDIRECT in the last 72 hours. Thyroid Function Tests: No results for input(s): TSH, T4TOTAL, FREET4, T3FREE, THYROIDAB in the last 72 hours. Anemia Panel: No results for input(s): VITAMINB12, FOLATE, FERRITIN, TIBC, IRON, RETICCTPCT in the last 72 hours. Urine analysis:    Component Value Date/Time   COLORURINE AMBER (A) 07/23/2019 2214   APPEARANCEUR CLOUDY (A) 07/23/2019 2214   LABSPEC 1.012 07/23/2019 2214   PHURINE 5.0 07/23/2019 2214   GLUCOSEU 50 (A) 07/23/2019 2214   HGBUR LARGE (A) 07/23/2019 2214   BILIRUBINUR NEGATIVE 07/23/2019 2214   KETONESUR NEGATIVE 07/23/2019 2214   PROTEINUR 30 (A) 07/23/2019 2214   NITRITE NEGATIVE 07/23/2019 2214   LEUKOCYTESUR SMALL (A) 07/23/2019 2214   No results found for this or any previous visit (from the past 240 hour(s)).    Radiology Studies: No results found.  Scheduled Meds: .  stroke: mapping our early stages of recovery book   Does not apply Once  . aspirin  325 mg Oral Daily  . atorvastatin  20 mg Oral q1800  . enoxaparin (LOVENOX) injection  40 mg Subcutaneous Q24H  .  feeding supplement (ENSURE ENLIVE)  237 mL Oral BID BM  . folic acid  1 mg Oral Daily  . lactulose  20 g Oral BID  . metoprolol tartrate  25 mg Oral BID  . multivitamin with minerals  1 tablet Oral Daily  . omega-3 acid ethyl esters  1 g Oral Daily  . pantoprazole  40 mg Oral Daily  . thiamine  100 mg Oral Daily   Or  . thiamine  100 mg Intravenous Daily   Continuous Infusions:   LOS: 112 days   Time spent: 15 minutes.  Tyrone Nineyan B Mycala Warshawsky, MD [facility] [email or web address] 11/12/2019, 2:13 PM

## 2019-11-13 NOTE — Progress Notes (Signed)
Nutrition Follow-up  DOCUMENTATION CODES:   Severe malnutrition in context of acute illness/injury  INTERVENTION:   Last BM 5 days ago  - Continue Ensure Enlive po BID, each supplement provides 350 kcal and 20 grams of protein  - Continue Magic cup TID with meals, each supplement provides 290 kcal and 9 grams of protein  - Continue MVI with minerals daily  NUTRITION DIAGNOSIS:   Severe Malnutrition related to acute illness (acute toxic/metabolic encephalopathy) as evidenced by moderate fat depletion, severe fat depletion, moderate muscle depletion, severe muscle depletion, energy intake < or equal to 50% for > or equal to 5 days, percent weight loss.  Ongoing  GOAL:   Patient will meet greater than or equal to 90% of their needs  Progressing  MONITOR:   PO intake, Supplement acceptance, Labs, Weight trends, Skin, I & O's  REASON FOR ASSESSMENT:   LOS    ASSESSMENT:   Tyler [name] is a 48 y.o. male with medical history significant of polysubstance abuse including heroin.  Brought in to [facility] ED just before MN after being found down by friend last evening with nearby syringe.  Pt is medically stable for discharge, awaiting SNF placement.  Pt reports appetite has decreased. States sometimes he just doesn't want to eat. Meal completions charted as 25-75% for his last five meals. Drinking Ensure inconsistently and eating Magic Cups BID. No Bm documented in 5 days. On lactulose.   Admission weight: 73 kg  Current weight: 69.4 kg   Medications: folic acid, lactulose, MVI with minerals, thimaine Labs: reviewed    Diet Order:   Diet Order            DIET DYS 3 Room service appropriate? No; Fluid consistency: Thin  Diet effective now              EDUCATION NEEDS:   Not appropriate for education at this time  Skin:  Skin Assessment: Reviewed RN Assessment  Last BM:  12/3  Height:   Ht Readings from Last 1 Encounters:  08/09/19 5\' 8"  (1.727 m)     Weight:   Wt Readings from Last 1 Encounters:  11/12/19 69.4 kg    Ideal Body Weight:  70 kg  BMI:  Body mass index is 23.26 kg/m.  Estimated Nutritional Needs:   Kcal:  2000-2200  Protein:  105-120 grams  Fluid:  > 2 L   Mariana Single RD, LDN Clinical Nutrition Pager # [phone]

## 2019-11-13 NOTE — Progress Notes (Signed)
Physical Therapy Discharge Patient Details Name: Tyler [name] MRN: [id number] DOB: [birth year] Today's Date: 11/13/2019 Time: 0931-1216 PT Time Calculation (min) (ACUTE ONLY): 14 min  Patient discharged from PT services secondary to goals met and no further PT needs identified.  Please see latest therapy progress note for current level of functioning and progress toward goals.    Progress and discharge plan discussed with patient and/or caregiver: Patient/Caregiver agrees with plan  GP     [place] 11/13/2019, 5:40 PM  [place] Pager [phone] Office [phone]

## 2019-11-13 NOTE — Progress Notes (Signed)
Occupational Therapy Treatment Patient Details Name: Tyler [name] MRN: [id number] DOB: 1970-12-31 Today's Date: 11/13/2019    History of present illness 48 y/o male transferred from [place] for unresponsiveness, likely secondary to substance abuse. Pt with rhabdomyolysis and AKI. EEG revealed moderate diffuse encephalopathy. PMH includes polysubstance abuse and HTN. Imaging show acute bialteral cerebellar infarcts.    OT comments  Patient sleeping upon arrival, initially wanting to do therapy later but ultimately agreeable. Patient participate in cognitive assessments line dissection, clock drawing, and crossing out letters/shapes in order to assess patient problem solving, visual scanning, attention, and insight of deficits. These skills are necessary for higher level tasks such as medication & money management, cooking/meal prep. Patient demonstrates impairments in these higher level skills with the inability to complete clock drawing task after multiple verbal instructions and inattention with crossing out appropriate letters/shapes after verbal instruction. Patient continues to require supervision and mod to max verbal cues in order to complete tasks with higher cognitive demand, therefore continue to recommend 24/7 supervision at discharge as patient would be unsafe on his own.    Follow Up Recommendations  Supervision/Assistance - 24 hour;SNF    Equipment Recommendations  None recommended by OT       Precautions / Restrictions Precautions Precautions: None Restrictions Weight Bearing Restrictions: No       Mobility Bed Mobility Overal bed mobility: Independent                Transfers Overall transfer level: Independent                    Balance Overall balance assessment: Independent Sitting-balance support: Feet supported Sitting balance-Leahy Scale: Normal     Standing balance support: During functional activity;No upper extremity  supported Standing balance-Leahy Scale: Good                             ADL either performed or assessed with clinical judgement   ADL                           Toilet Transfer: Independent;Ambulation   Toileting- Clothing Manipulation and Hygiene: Independent;Sit to/from stand               Vision   Additional Comments: patient grossly WNL with line dissection test, minor inattention to L noted during visual scanning crossing out letters/shapes.           Cognition Arousal/Alertness: Awake/alert Behavior During Therapy: Restless(patient discouraged about still being in hospital) Overall Cognitive Status: No family/caregiver present to determine baseline cognitive functioning Area of Impairment: Problem solving;Awareness;Attention;Safety/judgement                   Current Attention Level: Focused     Safety/Judgement: Decreased awareness of deficits Awareness: Intellectual;Emergent Problem Solving: Difficulty sequencing;Slow processing General Comments: decreased attention during visual scanning tasks with patient making multiple errors during 2 written tasks, decreased problem solving with verbal cues during clock drawing task. patient appears to have limited insight into deficits wanting to leave hospital.                    Pertinent Vitals/ Pain       Pain Assessment: No/denies pain      Frequency  Min 1X/week        Progress Toward Goals  OT Goals(current goals can now be found in the care  plan section)  Progress towards OT goals: Progressing toward goals  Acute Rehab OT Goals Patient Stated Goal: to get out of here and go home OT Goal Formulation: With patient Time For Goal Achievement: 11/19/19 Potential to Achieve Goals: Good ADL Goals Pt Will Perform Grooming: Independently;standing Pt Will Perform Upper Body Bathing: Independently;sitting Pt Will Perform Lower Body Bathing: Independently;sit to/from  stand Pt Will Perform Upper Body Dressing: Independently;sitting Pt Will Perform Lower Body Dressing: Independently;sit to/from stand Pt Will Transfer to Toilet: Independently;regular height toilet Pt Will Perform Toileting - Clothing Manipulation and hygiene: Independently;sit to/from stand Pt Will Perform Tub/Shower Transfer: Independently;shower seat;ambulating;grab bars Additional ADL Goal #1: pt will identify 3/3 ADL/toiletry items and retrieve them in prep for selfcare Additional ADL Goal #2: Pt will complete trail making task with <3 VC's for safe and successful completion Additional ADL Goal #3: Pt will complete 3 step task with <3 sequencing cues  Plan Discharge plan remains appropriate       AM-PAC OT "6 Clicks" Daily Activity     Outcome Measure   Help from another person eating meals?: None Help from another person taking care of personal grooming?: None Help from another person toileting, which includes using toliet, bedpan, or urinal?: None Help from another person bathing (including washing, rinsing, drying)?: None Help from another person to put on and taking off regular upper body clothing?: None Help from another person to put on and taking off regular lower body clothing?: None 6 Click Score: 24    End of Session    OT Visit Diagnosis: Other symptoms and signs involving cognitive function   Activity Tolerance Patient tolerated treatment well   Patient Left in bed;with call bell/phone within reach   Nurse Communication Other (comment)(results of cognitive tasks)        Time: 1100-1115 OT Time Calculation (min): 15 min  Charges: OT General Charges $OT Visit: 1 Visit OT Treatments $Cognitive Funtion additional: Additional15 mins  Myrtie Neither OT OT office: [phone]    Carmelia Roller 11/13/2019, 2:02 PM

## 2019-11-13 NOTE — Progress Notes (Signed)
Physical Therapy Treatment Patient Details Name: Tyler [name] MRN: [id number] DOB: 04-26-1971 Today's Date: 11/13/2019    History of Present Illness 48 y/o male transferred from [place] for unresponsiveness, likely secondary to substance abuse. Pt with rhabdomyolysis and AKI. EEG revealed moderate diffuse encephalopathy. PMH includes polysubstance abuse and HTN. Imaging show acute bialteral cerebellar infarcts.     PT Comments    Pt is independent with all mobility including getting on and off floor but continues to have cognitive deficits. Was able to find his room after amb in hallway today. Has met all PT goals and no longer requires skilled PT. Continue to recommend a supervised setting at DC for cognition.   Follow Up Recommendations  Other (comment);No PT follow up(supervised setting for cognitive deficits)     Equipment Recommendations  None recommended by PT    Recommendations for Other Services       Precautions / Restrictions Precautions Precautions: None Restrictions Weight Bearing Restrictions: No    Mobility  Bed Mobility Overal bed mobility: Independent                Transfers Overall transfer level: Independent                  Ambulation/Gait Ambulation/Gait assistance: Independent Gait Distance (Feet): 800 Feet Assistive device: None Gait Pattern/deviations: WFL(Within Functional Limits) Gait velocity: WFL Gait velocity interpretation: >2.62 ft/sec, indicative of community ambulatory General Gait Details: Pt with steady gait with distractions and with looking for room.    Stairs             Wheelchair Mobility    Modified Rankin (Stroke Patients Only) Modified Rankin (Stroke Patients Only) Pre-Morbid Rankin Score: No symptoms Modified Rankin: Moderate disability     Balance Overall balance assessment: Independent                                          Cognition Arousal/Alertness:  Awake/alert Behavior During Therapy: WFL for tasks assessed/performed Overall Cognitive Status: No family/caregiver present to determine baseline cognitive functioning Area of Impairment: Problem solving;Awareness;Attention                   Current Attention Level: Sustained     Safety/Judgement: Decreased awareness of deficits Awareness: Intellectual;Emergent Problem Solving: Difficulty sequencing;Slow processing        Exercises      General Comments        Pertinent Vitals/Pain Pain Assessment: No/denies pain    Home Living                      Prior Function            PT Goals (current goals can now be found in the care plan section) Acute Rehab PT Goals Patient Stated Goal: to get out of here and go home Progress towards PT goals: Goals met/education completed, patient discharged from PT    Frequency           PT Plan Discharge plan needs to be updated    Co-evaluation              AM-PAC PT "6 Clicks" Mobility   Outcome Measure  Help needed turning from your back to your side while in a flat bed without using bedrails?: None Help needed moving from lying on your back to sitting on the side of  a flat bed without using bedrails?: None Help needed moving to and from a bed to a chair (including a wheelchair)?: None Help needed standing up from a chair using your arms (e.g., wheelchair or bedside chair)?: None Help needed to walk in hospital room?: None Help needed climbing 3-5 steps with a railing? : None 6 Click Score: 24    End of Session   Activity Tolerance: Patient tolerated treatment well Patient left: Other (comment)(standing at doorway)   PT Visit Diagnosis: Other abnormalities of gait and mobility (R26.89)     Time: 6160-7371 PT Time Calculation (min) (ACUTE ONLY): 14 min  Charges:  $Gait Training: 8-22 mins                     [place] Pager [phone] Office  [place] 11/13/2019, 5:37 PM

## 2019-11-13 NOTE — Progress Notes (Signed)
PROGRESS NOTE  Tyler [name]  SLH:[id number] DOB: August 14, 1971 DOA: 07/23/2019 PCP: Patient, No Pcp Per   Brief Narrative: Tyler [name] is a 48 y.o. male with a history of polysubstance abuse who presented to [facility] ER after being found down with a syringe nearby. He was given Narcan with minimal improvement. On presentation, he was found to have severe rhabdomyolysis with acute kidney injury and hyperkalemia. He was started on IV fluids including bicarb drip and transferred to [facility] for further care. Rhabdomyolysis improved but he had persistence of encephalopathy and was found to have acute bilateral cerebellar infarct. Neurology was consulted and has subsequently signed off. PT recommended SNF placement. Prolonged hospitalization due to placement issues.   Patient is medically stable for discharge. Barriers to discharge currently are patient's cognitive status secondary to his issues as below as well as lack of ability to gain guardianship at this point as he does not have any family members willing to take care of him and patient is a difficult placement at SNF or ALF.  Assessment & Plan: Principal Problem:   Cerebral embolism with cerebral infarction Active Problems:   Rhabdomyolysis   Acute kidney failure (HCC)   Acute metabolic encephalopathy   Polysubstance abuse (HCC)   Hyperkalemia   Transaminitis   HTN (hypertension)   Protein-calorie malnutrition, severe  Acute bilateral ischemic CVA, nonhemorrhagic: Neurology has signed off. Carotid duplex wnl, LDL 83, A1c 5.1%. No cardioembolic source on echocardiogram (LVEF 60 - 65%). Negative LE dopplers - Stable with new baseline cognitive deficits which are prohibiting his ability to live independently - Continue ASA, statin, risk factor control.  - Neurology recommended considering 30-day cardiac event monitoring as outpatient to rule out A. fib if patient neurologically improves  Cognitive deficit secondary to above:  Per psychiatry: Patient does not have mental capacity to make an informed decision and recommending someone assume guardianship for him. Unfortunately this is very difficult as so far no one has been willing to assume guardianship. - At present patient's daughter has a significant substance abuse problem and is unable to manage the care of the patient, patient's son is also unable to manage care.  At this juncture there is no one who can assist with 24/7 care of this patient. 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 CT of the head and neck was initially negative for any traumatic finding. UDS, Tylenol and salicylate levels were negative. TSH, folate, B12 levels all within normal limits. EEG: Generalized diffuse slowing but no seizure activity -Had required intermittent restraints initially during the hospitalization, now without requirements. Continue monitoring.  Severe rhabdomyolysis/AKI: Resolved with IV fluids. Renal ultrasound negative.  - Remains off IV fluids with stable UOP. Monitor Scr intermittently.   Transaminitis with elevated bilirubin: Probably from alcohol abuse. Right upper quadrant ultrasound showed hepatic steatosis with early cirrhosis with some gallbladder thickening - Resolved.   Alcohol abuse - Advised to quit.  - Continue thiamine, multivitamin and folic acid  UTI: Present on admission. Completed 5 days of Rocephin  Essential hypertension: Stable.  -Continue metoprolol. Refuses medications   Severe protein calorie malnutrition - Follow nutrition recommendations  Abdominal pain/back pain: Resolved. X-ray showed unchanged T11 compression fracture, age-indeterminate L1 fracture - Continue Voltaren gel as needed.  Generalized deconditioning: Likely secondary to alcoholism, CVA, prolonged  hospitalization -PT, OT recommending SNF -Last PT evaluation was on 11/30: Patient's  visual acuity had improved-he continued to require reorientation and max directional verbal cues to complete tasks.  He was able to find his room with minimal cueing which was improved over the previous week.  Patient continued to have lack of insight regarding his underlying deficits and safety was documented as unsafe to return to home alone.  This was supported by patient's recent psychiatric evaluation as well  DVT prophylaxis: Lovenox Code Status: Full Family Communication: None available. Please see note dated 12/3 from previous attending physician.  Initially contacted patient's aunt who then proceeded to give physician number of patient's daughter.  Unfortunately this phone had been left at the residence of the patient's previous boyfriend to answer the phone and updated physician regarding patient's daughter.  Apparently patient's daughter has a significant substance abuse problem and this is the rationale for why she unexpectedly left boyfriend who now has possession of her phone.  Boyfriend [name] stated he would like to be helpful but would be unable to take care of patient full-time.  Social worker was updated by physician regarding above situation as well. Disposition Plan: Uncertain. CSW reports they will send out bed requests to ALF and SNFs  Consultants:   Psychiatry  Procedures:  Echocardiogram 1. The left ventricle has normal systolic function with an ejection fraction of 60-65%. The cavity size was normal. Left ventricular diastolic Doppler parameters are consistent with impaired relaxation. No evidence of left ventricular regional wall motion abnormalities. 2. The right ventricle has normal systolic function. The cavity was normal. There is no increase in right ventricular wall thickness. Right ventricular systolic pressure is normal with an estimated pressure of 21.0 mmHg. 3. The aortic valve  is tricuspid. Moderate sclerosis of the aortic valve. 4. The aorta is normal unless otherwise noted.  EEG This study issuggestive of moderate diffuse encephalopathy which is nonspecific to etiology. No seizures or epileptiform discharges were seen throughout the recording.  Cultures: Blood cultures on 8/17 no growth MRSA nasal PCR on 8/18 neg Urine culture on 8/19 multiple contaminants Blood cultures from 8/22 neg Repeat MRSA PCR on 9/19  neg  Antimicrobials:  Ceftriaxone 8/19 - 8/23  Subjective: Slept ok, no new complaints.  Objective: Vitals:   11/12/19 2123 11/13/19 0548 11/13/19 0549 11/13/19 0840  BP: 109/90 98/75 97/84  110/79  Pulse: 75 73 71 73  Resp: 16 16  18   Temp: 97.8 F (36.6 C) 98.3 F (36.8 C)  98.3 F (36.8 C)  TempSrc: Oral Oral  Oral  SpO2: 97% 96% 93% 97%  Weight:      Height:        Intake/Output Summary (Last 24 hours) at 11/13/2019 1336 Last data filed at 11/13/2019 0900 Gross per 24 hour  Intake 480 ml  Output 0 ml  Net 480 ml   Filed Weights   11/09/19 2143 11/11/19 0449 11/12/19 0437  Weight: 73.4 kg 68.6 kg 69.4 kg   Gen: 48 y.o. male in no distress Pulm: Nonlabored breathing room air.  Neuro: Alert and oriented to place, impaired short term recall.   CBC: Recent Labs  Lab 11/09/19 0233  WBC 9.6  HGB 13.4  HCT 40.6  MCV 91.4  PLT 616   Basic Metabolic Panel: Recent Labs  Lab 11/09/19 0233 11/10/19 0003  NA 141 141  K 3.5 3.8  CL 105 107  CO2 25 25  GLUCOSE 128* 94  BUN 22* 22*  CREATININE 1.39* 1.12  CALCIUM 9.3 9.5    Scheduled Meds: .  stroke:  mapping our early stages of recovery book   Does not apply Once  . aspirin  325 mg Oral Daily  . atorvastatin  20 mg Oral q1800  . enoxaparin (LOVENOX) injection  40 mg Subcutaneous Q24H  . feeding supplement (ENSURE ENLIVE)  237 mL Oral BID BM  . folic acid  1 mg Oral Daily  . lactulose  20 g Oral BID  . metoprolol tartrate  25 mg Oral BID  . multivitamin with  minerals  1 tablet Oral Daily  . omega-3 acid ethyl esters  1 g Oral Daily  . pantoprazole  40 mg Oral Daily  . thiamine  100 mg Oral Daily   Or  . thiamine  100 mg Intravenous Daily   Continuous Infusions:   LOS: 113 days   Time spent: 15 minutes.  Tyrone Nineyan B Grunz, MD [facility] [email or web address] 11/13/2019, 1:36 PM

## 2019-11-13 NOTE — Plan of Care (Signed)
  Problem: Clinical Measurements: Goal: Ability to maintain clinical measurements within normal limits will improve Outcome: Progressing   Problem: Education: Goal: Knowledge of General Education information will improve Description: Including pain rating scale, medication(s)/side effects and non-pharmacologic comfort measures Outcome: Progressing   

## 2019-11-13 NOTE — TOC Progression Note (Signed)
Transition of Care [facility]) - Progression Note    Patient Details  Name: Mahesh Sizemore MRN: [id number] Date of Birth: [birth year]  Transition of [facility]) CM/SW Contact  Sharlet Salina Mila Homer, LCSW Phone Number: 11/13/2019, 4:43 PM  Clinical Narrative:  Patient is " Difficult to Place" as CSW's have been unable to locate facility placement to date. Consulted with TOC Director Nathaniel Man today and was advised to contact Levie Heritage with [place]. Talked with Ms. Huel Cote [phone]) regarding patient and answered her questions regarding behaviors, etc. CSW advised by Mrs. Rucker that she will talk with her son and get back with CSW.    Expected Discharge Plan: Skilled Nursing Facility Barriers to Discharge: Active Substance Use - Placement, SNF Pending payor source - LOG, SNF Pending bed offer  Expected Discharge Plan and Services Expected Discharge Plan: [place]. **Patient no longer needs SNF per PT as he is totally independent from a mobility standpoint.    Discharge Planning Services: CM Consult, Other - See comment(Pt will need LOG for SNF placement) Post Acute Care Choice: [place] arrangements for the past 2 months: Single Family Home                 DME Arranged: N/A           HH Agency: NA         Social Determinants of Health (SDOH) Interventions  Patient in need of ALF/Family Care Home   Readmission Risk Interventions No flowsheet data found.

## 2019-11-14 NOTE — TOC Progression Note (Signed)
Transition of Care [facility]) - Progression Note    Patient Details  Name: Lawton Dollinger MRN: [id number] Date of Birth: [birth year]  Transition of [facility]) CM/SW Contact  Sharlet Salina Mila Homer, LCSW Phone Number: 11/14/2019, 4:55 PM  Clinical Narrative:   Search continues for a [place] ([facility])/ALF for patient. Follow-up call made to Mrs. Rucker, owner of [employer] and they do not have any openings at this time.Call made to [facility] [place] [phone]) and was given the name of number of the owner - Robyne Peers- [phone]. Call made to Mrs. Lackey regarding patient and she explained that she has one male bed, however they primarily take persons who need help with 2 to 3 ADL's (I.e., bathing, dressing, eating, personal care) and persons who use a wheelchair, walker, or cane. CSW informed that they have 2 houses: 1 with 5 persons and the other with 6 persons. Mrs. Bernadene Bell recommended that CSW call [facility] and speak with owner Nora Springs [phone]. Call made to Mrs. Hudson and she has just filled her last male bed earlier today. CSW will continue search for a Family Care Home/ALF for patient.     Expected Discharge Plan: Skilled Nursing Facility Barriers to Discharge: Active Substance Use - Placement, SNF Pending payor source - LOG, SNF Pending bed offer  Expected Discharge Plan and Services Expected Discharge Plan: [place]   Discharge Planning Services: CM Consult, Other - See comment(Pt will need LOG for SNF placement) Post Acute Care Choice: [place] arrangements for the past 2 months: Single Family Home                 DME Arranged: N/A           HH Agency: NA         Social Determinants of Health (SDOH) Interventions  Patient in need of an Family Care Home/ALF  Readmission Risk Interventions No flowsheet data found.

## 2019-11-14 NOTE — TOC Progression Note (Signed)
Transition of Care [facility]) - Progression Note    Patient Details  Name: Tyler [name] MRN: [id number] Date of Birth: Nov 28, 1971  Transition of Care [facility]) CM/SW Contact  Sharlet Salina Mila Homer, LCSW Phone Number: 11/14/2019, 12:50 PM  Clinical Narrative:   Call made to Mrs. Kirk [facility]-, [phone]) regarding patient. CSW advised by Mrs. Rucker that she has no openings at this time and declined having clinicals sent as no availability.    Expected Discharge Plan: Skilled Nursing Facility Barriers to Discharge: Active Substance Use - Placement, SNF Pending payor source - LOG, SNF Pending bed offer  Expected Discharge Plan and Services Expected Discharge Plan: [place]   Discharge Planning Services: CM Consult, Other - See comment(Pt will need LOG for SNF placement) Post Acute Care Choice: [place] arrangements for the past 2 months: Single Family Home                 DME Arranged: N/A           HH Agency: NA       Social Determinants of Health (SDOH) Interventions  Patient in need of ALF/Adult care home  Readmission Risk Interventions No flowsheet data found.

## 2019-11-14 NOTE — Progress Notes (Signed)
PROGRESS NOTE  Tyler [name] IWO:[id number] DOB: 1971/05/05 DOA: 07/23/2019 PCP: Patient, No Pcp Per  Brief Narrative: Tyler [name] is a 48 y.o. male with a history of polysubstance abuse who presented to [facility] ER after being found down with a syringe nearby. He was given Narcan with minimal improvement. On presentation, he was found to have severe rhabdomyolysis with acute kidney injury and hyperkalemia. He was started on IV fluids including bicarb drip and transferred to [facility] for further care. Rhabdomyolysis improved but he had persistence of encephalopathy and was found to have acute bilateral cerebellar infarct. Neurology was consulted and has subsequently signed off. PT recommended SNF placement. Prolonged hospitalization due to placement issues.   Patient is medically stable for discharge. Barriers to discharge currently are patient's cognitive status secondary to his issues as below as well as lack of ability to gain guardianship at this point as he does not have any family members willing to take care of him and patient is a difficult placement at SNF or ALF.    HPI/Recap of past 24 hours:  He is sitting up at the edge of bed, denies pain, he is frustrated that  He has been waiting for placement but so far there is no place to take him  Assessment/Plan: Principal Problem:   Cerebral embolism with cerebral infarction Active Problems:   Rhabdomyolysis   Acute kidney failure (HCC)   Acute metabolic encephalopathy   Polysubstance abuse (HCC)   Hyperkalemia   Transaminitis   HTN (hypertension)   Protein-calorie malnutrition, severe  Acute bilateral ischemic CVA, nonhemorrhagic: Neurology has signed off. Carotid duplex wnl, LDL 83, A1c 5.1%. No cardioembolic source on echocardiogram (LVEF 60 - 65%). Negative LE dopplers - Stable with new baseline cognitive deficits which are prohibiting his ability to live independently - Continue ASA, statin, risk factor  control.  - Neurology recommended considering 30-day cardiac event monitoring as outpatient to rule out A. fib if patient neurologically improves  Cognitive deficit secondary to above: Per psychiatry: Patient does not have mental capacity to make an informed decision and recommending someone assume guardianship for him. Unfortunately this is very difficult as so far no one has been willing to assume guardianship. - At present patient's daughter has a significant substance abuse problem and is unable to manage the care of the patient, patient's son is also unable to manage care.  At this juncture there is no one who can assist with 24/7 care of this patient. 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 CT of the head and neck was initially negative for any traumatic finding. UDS, Tylenol and salicylate levels were negative. TSH, folate, B12 levels all within normal limits. EEG: Generalized diffuse slowing but no seizure activity -Had required intermittent restraints initially during the hospitalization, now without requirements. Continue monitoring.  Severe rhabdomyolysis/AKI: Resolved with IV fluids. Renal ultrasound negative.  - Remains off IV fluids with stable UOP. Monitor Scr intermittently.   Transaminitis with elevated bilirubin: Probably from alcohol abuse. Right upper quadrant ultrasound showed hepatic steatosis with early cirrhosis with some gallbladder thickening - Resolved.   Alcohol abuse - Advised to quit.  - Continue thiamine, multivitamin and folic acid  UTI: Present on admission. Completed 5 days of Rocephin  Essential hypertension: Stable.  -Continue metoprolol. Refuses medications   Severe protein calorie malnutrition - Follow nutrition recommendations  Abdominal pain/back pain:  Resolved. X-ray showed  unchanged T11 compression fracture, age-indeterminate L1 fracture - Continue Voltaren gel as needed.  Generalized deconditioning: Likely secondary to alcoholism, CVA, prolonged hospitalization -PT, OT recommending SNF -Last PT evaluation was on 11/30: Patient's visual acuity had improved-he continued to require reorientation and max directional verbal cues to complete tasks.  He was able to find his room with minimal cueing which was improved over the previous week.  Patient continued to have lack of insight regarding his underlying deficits and safety was documented as unsafe to return to home alone.  This was supported by patient's recent psychiatric evaluation as well  DVT prophylaxis: Lovenox, he has been refusing Code Status: Full Family Communication: None available. Please see note dated 12/3 from previous attending physician. Initially contacted patient's aunt who then proceeded to give physician number of patient's daughter. Unfortunately this phone had been left at the residence of the patient's previous boyfriend to answer the phone and updated physician regarding patient's daughter. Apparently patient's daughter has a significant substance abuse problem and this is the rationale for why she unexpectedly left boyfriend who now has possession of her phone. Boyfriend [name] stated he would like to be helpful butwould be unable to take care of patient full-time. Social worker was updated by physician regarding above situation as well. Disposition Plan: Uncertain. CSW reports they will send out bed requests to ALF and SNFs  Consultants:   Psychiatry  Procedures: Echocardiogram 1. The left ventricle has normal systolic function with an ejection fraction of 60-65%. The cavity size was normal. Left ventricular diastolic Doppler parameters are consistent with impaired relaxation. No evidence of left ventricular regional wall motion abnormalities. 2. The right ventricle has normal  systolic function. The cavity was normal. There is no increase in right ventricular wall thickness. Right ventricular systolic pressure is normal with an estimated pressure of 21.0 mmHg. 3. The aortic valve is tricuspid. Moderate sclerosis of the aortic valve. 4. The aorta is normal unless otherwise noted.  EEG This study issuggestive of moderate diffuse encephalopathy which is nonspecific to etiology. No seizures or epileptiform discharges were seen throughout the recording.  Cultures: Blood cultures on 8/17 no growth MRSA nasal PCR on 8/18 neg Urine culture on 8/19 multiple contaminants Blood cultures from 8/22 neg Repeat MRSA PCR on 9/19 neg  Antimicrobials:  Ceftriaxone 8/19 - 8/23    Objective: BP (!) 142/100 (BP Location: Right Arm)    Pulse 72    Temp 98.7 F (37.1 C) (Oral)    Resp 18    Ht 5\' 8"  (1.727 m)    Wt 69.4 kg    SpO2 99%    BMI 23.27 kg/m   Intake/Output Summary (Last 24 hours) at 11/14/2019 1848 Last data filed at 11/14/2019 1700 Gross per 24 hour  Intake 1020 ml  Output 0 ml  Net 1020 ml   Filed Weights   11/11/19 0449 11/12/19 0437 11/13/19 2026  Weight: 68.6 kg 69.4 kg 69.4 kg    Exam: Patient is examined daily including today on 11/14/2019, exams remain the same as of yesterday except that has changed    General:  NAD, not oriented to time  Cardiovascular: RRR  Respiratory: CTABL  Abdomen: Soft/ND/NT, positive BS  Musculoskeletal: No Edema  Neuro: alert, oriented to place and person, not to time  Data Reviewed: Basic Metabolic Panel: Recent Labs  Lab 11/09/19 0233 11/10/19 0003  NA 141 141  K 3.5 3.8  CL 105 107  CO2 25 25  GLUCOSE  128* 94  BUN 22* 22*  CREATININE 1.39* 1.12  CALCIUM 9.3 9.5   Liver Function Tests: No results for input(s): AST, ALT, ALKPHOS, BILITOT, PROT, ALBUMIN in the last 168 hours. No results for input(s): LIPASE, AMYLASE in the last 168 hours. No results for input(s): AMMONIA in the last 168  hours. CBC: Recent Labs  Lab 11/09/19 0233  WBC 9.6  HGB 13.4  HCT 40.6  MCV 91.4  PLT 196   Cardiac Enzymes:   No results for input(s): CKTOTAL, CKMB, CKMBINDEX, TROPONINI in the last 168 hours. BNP (last 3 results) No results for input(s): BNP in the last 8760 hours.  ProBNP (last 3 results) No results for input(s): PROBNP in the last 8760 hours.  CBG: No results for input(s): GLUCAP in the last 168 hours.  No results found for this or any previous visit (from the past 240 hour(s)).   Studies: No results found.  Scheduled Meds:   stroke: mapping our early stages of recovery book   Does not apply Once   aspirin  325 mg Oral Daily   atorvastatin  20 mg Oral q1800   enoxaparin (LOVENOX) injection  40 mg Subcutaneous Q24H   feeding supplement (ENSURE ENLIVE)  237 mL Oral BID BM   folic acid  1 mg Oral Daily   lactulose  20 g Oral BID   metoprolol tartrate  25 mg Oral BID   multivitamin with minerals  1 tablet Oral Daily   omega-3 acid ethyl esters  1 g Oral Daily   pantoprazole  40 mg Oral Daily   thiamine  100 mg Oral Daily   Or   thiamine  100 mg Intravenous Daily    Continuous Infusions:   Time spent: 15mins I have personally reviewed and interpreted on  11/14/2019 daily labs, tele strips, imagings as discussed above under date review session and assessment and plans.  I reviewed all nursing notes, pharmacy notes, consultant notes,  vitals, pertinent old records  I have discussed plan of care as described above with RN , patient on 11/14/2019   Albertine GratesFang Andreka Stucki MD, PhD, FACP  [facility] Pager [phone]. If 7PM-7AM, please contact night-coverage at [email or web address], password [email or web address] 11/14/2019, 6:48 PM  LOS: 114 days

## 2019-11-14 NOTE — Plan of Care (Signed)
  Problem: Education: Goal: Knowledge of General Education information will improve Description Including pain rating scale, medication(s)/side effects and non-pharmacologic comfort measures Outcome: Progressing   

## 2019-11-14 NOTE — Plan of Care (Signed)
  Problem: Education: Goal: Knowledge of General Education information will improve Description: Including pain rating scale, medication(s)/side effects and non-pharmacologic comfort measures Outcome: Not Progressing   

## 2019-11-15 NOTE — Progress Notes (Signed)
PROGRESS NOTE  [name] QMV:[id number] DOB: [birth year] DOA: 07/23/2019 PCP: Patient, No Pcp Per  Brief Narrative: Khayden Herzberg is a 48 y.o. male with a history of polysubstance abuse who presented to [facility] ER after being found down with a syringe nearby. He was given Narcan with minimal improvement. On presentation, he was found to have severe rhabdomyolysis with acute kidney injury and hyperkalemia. He was started on IV fluids including bicarb drip and transferred to [facility] for further care. Rhabdomyolysis improved but he had persistence of encephalopathy and was found to have acute bilateral cerebellar infarct. Neurology was consulted and has subsequently signed off. PT recommended SNF placement. Prolonged hospitalization due to placement issues.   Patient is medically stable for discharge. Barriers to discharge currently are patient's cognitive status secondary to his issues as below as well as lack of ability to gain guardianship at this point as he does not have any family members willing to take care of him and patient is a difficult placement at SNF or ALF.    HPI/Recap of past 24 hours:  On acute event overnight  He is sitting up at the edge of bed, denies pain, he is frustrated that  He has been waiting for placement but so far there is no place to take him  Assessment/Plan: Principal Problem:   Cerebral embolism with cerebral infarction Active Problems:   Rhabdomyolysis   Acute kidney failure (HCC)   Acute metabolic encephalopathy   Polysubstance abuse (HCC)   Hyperkalemia   Transaminitis   HTN (hypertension)   Protein-calorie malnutrition, severe  Acute bilateral ischemic CVA, nonhemorrhagic: Neurology has signed off. Carotid duplex wnl, LDL 83, A1c 5.1%. No cardioembolic source on echocardiogram (LVEF 60 - 65%). Negative LE dopplers - Stable with new baseline cognitive deficits which are prohibiting his ability to live independently - Continue  ASA, statin, risk factor control.  - Neurology recommended considering 30-day cardiac event monitoring as outpatient to rule out A. fib if patient neurologically improves  Cognitive deficit secondary to above: Per psychiatry: Patient does not have mental capacity to make an informed decision and recommending someone assume guardianship for him. Unfortunately this is very difficult as so far no one has been willing to assume guardianship. - At present patient's daughter has a significant substance abuse problem and is unable to manage the care of the patient, patient's son is also unable to manage care.  At this juncture there is no one who can assist with 24/7 care of this patient. 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 CT of the head and neck was initially negative for any traumatic finding. UDS, Tylenol and salicylate levels were negative. TSH, folate, B12 levels all within normal limits. EEG: Generalized diffuse slowing but no seizure activity -Had required intermittent restraints initially during the hospitalization, now without requirements. Continue monitoring.  Severe rhabdomyolysis/AKI: Resolved with IV fluids. Renal ultrasound negative.  - Remains off IV fluids with stable UOP. Monitor Scr intermittently.   Transaminitis with elevated bilirubin: Probably from alcohol abuse. Right upper quadrant ultrasound showed hepatic steatosis with early cirrhosis with some gallbladder thickening - Resolved.   Alcohol abuse - Advised to quit.  - Continue thiamine, multivitamin and folic acid  UTI: Present on admission. Completed 5 days of Rocephin  Essential hypertension: Stable.  -Continue metoprolol. Refuses medications   Severe protein calorie malnutrition - Follow nutrition  recommendations  Abdominal pain/back pain:  Resolved. X-ray showed unchanged T11 compression fracture, age-indeterminate L1 fracture - Continue Voltaren gel as needed.  Generalized deconditioning: Likely secondary to alcoholism, CVA, prolonged hospitalization -PT, OT recommending SNF -Last PT evaluation was on 11/30: Patient's visual acuity had improved-he continued to require reorientation and max directional verbal cues to complete tasks.  He was able to find his room with minimal cueing which was improved over the previous week.  Patient continued to have lack of insight regarding his underlying deficits and safety was documented as unsafe to return to home alone.  This was supported by patient's recent psychiatric evaluation as well  DVT prophylaxis: Lovenox, he has been refusing Code Status: Full Family Communication: None available. Please see note dated 12/3 from previous attending physician. Initially contacted patient's aunt who then proceeded to give physician number of patient's daughter. Unfortunately this phone had been left at the residence of the patient's previous boyfriend to answer the phone and updated physician regarding patient's daughter. Apparently patient's daughter has a significant substance abuse problem and this is the rationale for why she unexpectedly left boyfriend who now has possession of her phone. Boyfriend [name] stated he would like to be helpful butwould be unable to take care of patient full-time. Social worker was updated by physician regarding above situation as well. Disposition Plan: Uncertain. CSW reports they will send out bed requests to ALF and SNFs case discussed with social worker today, no bed offer  Consultants:   Psychiatry  Neurology (signed off on 08/02/2019)  Procedures: Echocardiogram 1. The left ventricle has normal systolic function with an ejection fraction of 60-65%. The cavity size was normal. Left ventricular diastolic Doppler parameters are consistent with impaired  relaxation. No evidence of left ventricular regional wall motion abnormalities. 2. The right ventricle has normal systolic function. The cavity was normal. There is no increase in right ventricular wall thickness. Right ventricular systolic pressure is normal with an estimated pressure of 21.0 mmHg. 3. The aortic valve is tricuspid. Moderate sclerosis of the aortic valve. 4. The aorta is normal unless otherwise noted.  EEG This study issuggestive of moderate diffuse encephalopathy which is nonspecific to etiology. No seizures or epileptiform discharges were seen throughout the recording.  Cultures: Blood cultures on 8/17 no growth MRSA nasal PCR on 8/18 neg Urine culture on 8/19 multiple contaminants Blood cultures from 8/22 neg Repeat MRSA PCR on 9/19 neg  Antimicrobials:  Ceftriaxone 8/19 - 8/23    Objective: BP 108/75 (BP Location: Left Arm)   Pulse 69   Temp (!) 97.5 F (36.4 C) (Oral)   Resp 18   Ht 5\' 8"  (1.727 m)   Wt 69.4 kg   SpO2 96%   BMI 23.27 kg/m   Intake/Output Summary (Last 24 hours) at 11/15/2019 1726 Last data filed at 11/15/2019 1516 Gross per 24 hour  Intake 1137 ml  Output 600 ml  Net 537 ml   Filed Weights   11/11/19 0449 11/12/19 0437 11/13/19 2026  Weight: 68.6 kg 69.4 kg 69.4 kg    Exam: Patient is examined daily including today on 11/15/2019, exams remain the same as of yesterday except that has changed    General:  NAD, not oriented to time  Cardiovascular: RRR  Respiratory: CTABL  Abdomen: Soft/ND/NT, positive BS  Musculoskeletal: No Edema  Neuro: alert, oriented to place and person, not to time, immediate recall 2/3.  Data Reviewed: Basic Metabolic Panel: Recent Labs  Lab 11/09/19 0233 11/10/19 0003  NA  141 141  K 3.5 3.8  CL 105 107  CO2 25 25  GLUCOSE 128* 94  BUN 22* 22*  CREATININE 1.39* 1.12  CALCIUM 9.3 9.5   Liver Function Tests: No results for input(s): AST, ALT, ALKPHOS, BILITOT, PROT, ALBUMIN in  the last 168 hours. No results for input(s): LIPASE, AMYLASE in the last 168 hours. No results for input(s): AMMONIA in the last 168 hours. CBC: Recent Labs  Lab 11/09/19 0233  WBC 9.6  HGB 13.4  HCT 40.6  MCV 91.4  PLT 196   Cardiac Enzymes:   No results for input(s): CKTOTAL, CKMB, CKMBINDEX, TROPONINI in the last 168 hours. BNP (last 3 results) No results for input(s): BNP in the last 8760 hours.  ProBNP (last 3 results) No results for input(s): PROBNP in the last 8760 hours.  CBG: No results for input(s): GLUCAP in the last 168 hours.  No results found for this or any previous visit (from the past 240 hour(s)).   Studies: No results found.  Scheduled Meds: .  stroke: mapping our early stages of recovery book   Does not apply Once  . aspirin  325 mg Oral Daily  . atorvastatin  20 mg Oral q1800  . enoxaparin (LOVENOX) injection  40 mg Subcutaneous Q24H  . feeding supplement (ENSURE ENLIVE)  237 mL Oral BID BM  . folic acid  1 mg Oral Daily  . lactulose  20 g Oral BID  . metoprolol tartrate  25 mg Oral BID  . multivitamin with minerals  1 tablet Oral Daily  . omega-3 acid ethyl esters  1 g Oral Daily  . pantoprazole  40 mg Oral Daily  . thiamine  100 mg Oral Daily   Or  . thiamine  100 mg Intravenous Daily    Continuous Infusions:   Time spent: I have personally reviewed and interpreted on  11/15/2019 daily labs,  imagings as discussed above under date review session and assessment and plans.  I reviewed all nursing notes, pharmacy notes, consultant notes,  vitals, pertinent old records  I have discussed plan of care as described above with RN , patient on 11/15/2019   Albertine Grates MD, PhD, FACP  [facility] Pager [phone]. If 7PM-7AM, please contact night-coverage at [email or web address], password [facility] 11/15/2019, 5:26 PM  LOS: 115 days

## 2019-11-15 NOTE — Plan of Care (Signed)
  Problem: Clinical Measurements: Goal: Ability to maintain clinical measurements within normal limits will improve Outcome: Progressing   

## 2019-11-16 NOTE — Plan of Care (Signed)
  Problem: Education: Goal: Knowledge of General Education information will improve Description Including pain rating scale, medication(s)/side effects and non-pharmacologic comfort measures Outcome: Progressing   

## 2019-11-16 NOTE — Plan of Care (Signed)
  Problem: Health Behavior/Discharge Planning: Goal: Ability to manage health-related needs will improve Outcome: Progressing   

## 2019-11-16 NOTE — Progress Notes (Signed)
PROGRESS NOTE  Tyler [name] ZOX:[id number] DOB: 08/14/1971 DOA: 07/23/2019 PCP: Patient, No Pcp Per  Brief Narrative: Tyler AppleSteven Treanor is a 48 y.o. male with a history of polysubstance abuse who presented to [facility] ER after being found down with a syringe nearby. He was given Narcan with minimal improvement. On presentation, he was found to have severe rhabdomyolysis with acute kidney injury and hyperkalemia. He was started on IV fluids including bicarb drip and transferred to [facility] for further care. Rhabdomyolysis improved but he had persistence of encephalopathy and was found to have acute bilateral cerebellar infarct. Neurology was consulted and has subsequently signed off. PT recommended SNF placement. Prolonged hospitalization due to placement issues.   Patient is medically stable for discharge. Barriers to discharge currently are patient's cognitive status secondary to his issues as below as well as lack of ability to gain guardianship at this point as he does not have any family members willing to take care of him and patient is a difficult placement at SNF or ALF.    HPI/Recap of past 24 hours:  On acute event overnight  He is sitting up at the edge of bed, denies pain, he is frustrated that  He has been waiting for placement but so far there is no place to take him  Assessment/Plan: Principal Problem:   Cerebral embolism with cerebral infarction Active Problems:   Rhabdomyolysis   Acute kidney failure (HCC)   Acute metabolic encephalopathy   Polysubstance abuse (HCC)   Hyperkalemia   Transaminitis   HTN (hypertension)   Protein-calorie malnutrition, severe  Acute bilateral ischemic CVA, nonhemorrhagic: Neurology has signed off. Carotid duplex wnl, LDL 83, A1c 5.1%. No cardioembolic source on echocardiogram (LVEF 60 - 65%). Negative LE dopplers - Stable with new baseline cognitive deficits which are prohibiting his ability to live independently - Continue  ASA, statin, risk factor control.  - Neurology recommended considering 30-day cardiac event monitoring as outpatient to rule out A. fib if patient neurologically improves  Cognitive deficit secondary to above: Per psychiatry: Patient does not have mental capacity to make an informed decision and recommending someone assume guardianship for him. Unfortunately this is very difficult as so far no one has been willing to assume guardianship. - At present patient's daughter has a significant substance abuse problem and is unable to manage the care of the patient, patient's son is also unable to manage care.  At this juncture there is no one who can assist with 24/7 care of this patient. 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 CT of the head and neck was initially negative for any traumatic finding. UDS, Tylenol and salicylate levels were negative. TSH, folate, B12 levels all within normal limits. EEG: Generalized diffuse slowing but no seizure activity -Had required intermittent restraints initially during the hospitalization, now without requirements. Continue monitoring.  Severe rhabdomyolysis/AKI: Resolved with IV fluids. Renal ultrasound negative.  - Remains off IV fluids with stable UOP. Monitor Scr intermittently.   Transaminitis with elevated bilirubin: Probably from alcohol abuse. Right upper quadrant ultrasound showed hepatic steatosis with early cirrhosis with some gallbladder thickening - Resolved.   Alcohol abuse - Advised to quit.  - Continue thiamine, multivitamin and folic acid  UTI: Present on admission. Completed 5 days of Rocephin  Essential hypertension: Stable.  -Continue metoprolol. Refuses medications   Severe protein calorie malnutrition - Follow nutrition  recommendations  Abdominal pain/back pain:  Resolved. X-ray showed unchanged T11 compression fracture, age-indeterminate L1 fracture - Continue Voltaren gel as needed.  Generalized deconditioning: Likely secondary to alcoholism, CVA, prolonged hospitalization -PT, OT recommending SNF -Last PT evaluation was on 11/30: Patient's visual acuity had improved-he continued to require reorientation and max directional verbal cues to complete tasks.  He was able to find his room with minimal cueing which was improved over the previous week.  Patient continued to have lack of insight regarding his underlying deficits and safety was documented as unsafe to return to home alone.  This was supported by patient's recent psychiatric evaluation as well  DVT prophylaxis: Lovenox, he has been refusing Code Status: Full Family Communication: None available. Please see note dated 12/3 from previous attending physician. Initially contacted patient's aunt who then proceeded to give physician number of patient's daughter. Unfortunately this phone had been left at the residence of the patient's previous boyfriend to answer the phone and updated physician regarding patient's daughter. Apparently patient's daughter has a significant substance abuse problem and this is the rationale for why she unexpectedly left boyfriend who now has possession of her phone. Boyfriend [name] stated he would like to be helpful butwould be unable to take care of patient full-time. Social worker was updated by physician regarding above situation as well. Disposition Plan: Uncertain. CSW reports they will send out bed requests to ALF and SNFs case discussed with social worker today, no bed offer  Consultants:   Psychiatry  Neurology (signed off on 08/02/2019)  Procedures: Echocardiogram 1. The left ventricle has normal systolic function with an ejection fraction of 60-65%. The cavity size was normal. Left ventricular diastolic Doppler parameters are consistent with impaired  relaxation. No evidence of left ventricular regional wall motion abnormalities. 2. The right ventricle has normal systolic function. The cavity was normal. There is no increase in right ventricular wall thickness. Right ventricular systolic pressure is normal with an estimated pressure of 21.0 mmHg. 3. The aortic valve is tricuspid. Moderate sclerosis of the aortic valve. 4. The aorta is normal unless otherwise noted.  EEG This study issuggestive of moderate diffuse encephalopathy which is nonspecific to etiology. No seizures or epileptiform discharges were seen throughout the recording.  Cultures: Blood cultures on 8/17 no growth MRSA nasal PCR on 8/18 neg Urine culture on 8/19 multiple contaminants Blood cultures from 8/22 neg Repeat MRSA PCR on 9/19 neg  Antimicrobials:  Ceftriaxone 8/19 - 8/23    Objective: BP 123/81 (BP Location: Right Arm)    Pulse 64    Temp (!) 97.4 F (36.3 C) (Oral)    Resp 16    Ht 5\' 8"  (1.727 m)    Wt 70.2 kg    SpO2 96%    BMI 23.53 kg/m   Intake/Output Summary (Last 24 hours) at 11/16/2019 1446 Last data filed at 11/16/2019 1333 Gross per 24 hour  Intake 1260 ml  Output 1200 ml  Net 60 ml   Filed Weights   11/12/19 0437 11/13/19 2026 11/16/19 0500  Weight: 69.4 kg 69.4 kg 70.2 kg    Exam: Patient is examined daily including today on 11/16/2019, exams remain the same as of yesterday except that has changed    General:  NAD, not oriented to time  Cardiovascular: RRR  Respiratory: CTABL  Abdomen: Soft/ND/NT, positive BS  Musculoskeletal: No Edema  Neuro: alert, oriented to place and person, not to time, immediate recall 2/3.  Data Reviewed: Basic Metabolic Panel: Recent Labs  Lab 11/10/19 0003  NA 141  K 3.8  CL 107  CO2 25  GLUCOSE 94  BUN 22*  CREATININE 1.12  CALCIUM 9.5   Liver Function Tests: No results for input(s): AST, ALT, ALKPHOS, BILITOT, PROT, ALBUMIN in the last 168 hours. No results for input(s):  LIPASE, AMYLASE in the last 168 hours. No results for input(s): AMMONIA in the last 168 hours. CBC: No results for input(s): WBC, NEUTROABS, HGB, HCT, MCV, PLT in the last 168 hours. Cardiac Enzymes:   No results for input(s): CKTOTAL, CKMB, CKMBINDEX, TROPONINI in the last 168 hours. BNP (last 3 results) No results for input(s): BNP in the last 8760 hours.  ProBNP (last 3 results) No results for input(s): PROBNP in the last 8760 hours.  CBG: No results for input(s): GLUCAP in the last 168 hours.  No results found for this or any previous visit (from the past 240 hour(s)).   Studies: No results found.  Scheduled Meds:   stroke: mapping our early stages of recovery book   Does not apply Once   aspirin  325 mg Oral Daily   atorvastatin  20 mg Oral q1800   enoxaparin (LOVENOX) injection  40 mg Subcutaneous Q24H   feeding supplement (ENSURE ENLIVE)  237 mL Oral BID BM   folic acid  1 mg Oral Daily   lactulose  20 g Oral BID   metoprolol tartrate  25 mg Oral BID   multivitamin with minerals  1 tablet Oral Daily   omega-3 acid ethyl esters  1 g Oral Daily   pantoprazole  40 mg Oral Daily   thiamine  100 mg Oral Daily   Or   thiamine  100 mg Intravenous Daily    Continuous Infusions:   Time spent: I have personally reviewed and interpreted on  11/16/2019 daily labs,  imagings as discussed above under date review session and assessment and plans.  I reviewed all nursing notes, pharmacy notes, consultant notes,  vitals, pertinent old records  I have discussed plan of care as described above with RN , patient on 11/16/2019   Albertine Grates MD, PhD, FACP  [facility]  If 7PM-7AM, please contact night-coverage at [email or web address], password [facility] 11/16/2019, 2:46 PM  LOS: 116 days

## 2019-11-17 NOTE — Progress Notes (Signed)
PROGRESS NOTE  Tyler [name] SPQ:[id number] DOB: 04-Apr-1971 DOA: 07/23/2019 PCP: Patient, No Pcp Per  Brief Narrative: Tyler [name] is a 48 y.o. male with a history of polysubstance abuse who presented to [facility] ER after being found down with a syringe nearby. He was given Narcan with minimal improvement. On presentation, he was found to have severe rhabdomyolysis with acute kidney injury and hyperkalemia. He was started on IV fluids including bicarb drip and transferred to [facility] for further care. Rhabdomyolysis improved but he had persistence of encephalopathy and was found to have acute bilateral cerebellar infarct. Neurology was consulted and has subsequently signed off. PT recommended SNF placement. Prolonged hospitalization due to placement issues.   Patient is medically stable for discharge. Barriers to discharge currently are patient's cognitive status secondary to his issues as below as well as lack of ability to gain guardianship at this point as he does not have any family members willing to take care of him and patient is a difficult placement at SNF or ALF.    HPI/Recap of past 24 hours:  On acute event overnight  He is sitting up at the edge of bed, denies pain, he is frustrated that  He has been waiting for placement but so far there is no place to take him  Assessment/Plan: Principal Problem:   Cerebral embolism with cerebral infarction Active Problems:   Rhabdomyolysis   Acute kidney failure (HCC)   Acute metabolic encephalopathy   Polysubstance abuse (HCC)   Hyperkalemia   Transaminitis   HTN (hypertension)   Protein-calorie malnutrition, severe  Acute bilateral ischemic CVA, nonhemorrhagic: Neurology has signed off. Carotid duplex wnl, LDL 83, A1c 5.1%. No cardioembolic source on echocardiogram (LVEF 60 - 65%). Negative LE dopplers - Stable with new baseline cognitive deficits which are prohibiting his ability to live independently - Continue  ASA, statin, risk factor control.  - Neurology recommended considering 30-day cardiac event monitoring as outpatient to rule out A. fib if patient neurologically improves  Cognitive deficit secondary to above: Per psychiatry: Patient does not have mental capacity to make an informed decision and recommending someone assume guardianship for him. Unfortunately this is very difficult as so far no one has been willing to assume guardianship. - At present patient's daughter has a significant substance abuse problem and is unable to manage the care of the patient, patient's son is also unable to manage care.  At this juncture there is no one who can assist with 24/7 care of this patient. 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 CT of the head and neck was initially negative for any traumatic finding. UDS, Tylenol and salicylate levels were negative. TSH, folate, B12 levels all within normal limits. EEG: Generalized diffuse slowing but no seizure activity -Had required intermittent restraints initially during the hospitalization, now without requirements. Continue monitoring.  Severe rhabdomyolysis/AKI: Resolved with IV fluids. Renal ultrasound negative.  - Remains off IV fluids with stable UOP. Monitor Scr intermittently.   Transaminitis with elevated bilirubin: Probably from alcohol abuse. Right upper quadrant ultrasound showed hepatic steatosis with early cirrhosis with some gallbladder thickening - Resolved.   Alcohol abuse - Advised to quit.  - Continue thiamine, multivitamin and folic acid  UTI: Present on admission. Completed 5 days of Rocephin  Essential hypertension: Stable.  -Continue metoprolol. Refuses medications   Severe protein calorie malnutrition - Follow nutrition  recommendations  Abdominal pain/back pain:  Resolved. X-ray showed unchanged T11 compression fracture, age-indeterminate L1 fracture - Continue Voltaren gel as needed.  Generalized deconditioning: Likely secondary to alcoholism, CVA, prolonged hospitalization -PT, OT recommending SNF -Last PT evaluation was on 11/30: Patient's visual acuity had improved-he continued to require reorientation and max directional verbal cues to complete tasks.  He was able to find his room with minimal cueing which was improved over the previous week.  Patient continued to have lack of insight regarding his underlying deficits and safety was documented as unsafe to return to home alone.  This was supported by patient's recent psychiatric evaluation as well  DVT prophylaxis: Lovenox, he has been refusing Code Status: Full Family Communication: None available. Please see note dated 12/3 from previous attending physician. Initially contacted patient's aunt who then proceeded to give physician number of patient's daughter. Unfortunately this phone had been left at the residence of the patient's previous boyfriend to answer the phone and updated physician regarding patient's daughter. Apparently patient's daughter has a significant substance abuse problem and this is the rationale for why she unexpectedly left boyfriend who now has possession of her phone. Boyfriend [name] stated he would like to be helpful butwould be unable to take care of patient full-time. Social worker was updated by physician regarding above situation as well. Disposition Plan: Uncertain. CSW reports they will send out bed requests to ALF and SNFs case discussed with social worker today, no bed offer  Consultants:   Psychiatry  Neurology (signed off on 08/02/2019)  Procedures: Echocardiogram 1. The left ventricle has normal systolic function with an ejection fraction of 60-65%. The cavity size was normal. Left ventricular diastolic Doppler parameters are consistent with impaired  relaxation. No evidence of left ventricular regional wall motion abnormalities. 2. The right ventricle has normal systolic function. The cavity was normal. There is no increase in right ventricular wall thickness. Right ventricular systolic pressure is normal with an estimated pressure of 21.0 mmHg. 3. The aortic valve is tricuspid. Moderate sclerosis of the aortic valve. 4. The aorta is normal unless otherwise noted.  EEG This study issuggestive of moderate diffuse encephalopathy which is nonspecific to etiology. No seizures or epileptiform discharges were seen throughout the recording.  Cultures: Blood cultures on 8/17 no growth MRSA nasal PCR on 8/18 neg Urine culture on 8/19 multiple contaminants Blood cultures from 8/22 neg Repeat MRSA PCR on 9/19 neg  Antimicrobials:  Ceftriaxone 8/19 - 8/23    Objective: BP 102/75 (BP Location: Right Arm)   Pulse 70   Temp 97.6 F (36.4 C) (Oral)   Resp 18   Ht 5\' 8"  (1.727 m)   Wt 70.2 kg   SpO2 94%   BMI 23.53 kg/m   Intake/Output Summary (Last 24 hours) at 11/17/2019 1337 Last data filed at 11/17/2019 0900 Gross per 24 hour  Intake 780 ml  Output 700 ml  Net 80 ml   Filed Weights   11/12/19 0437 11/13/19 2026 11/16/19 0500  Weight: 69.4 kg 69.4 kg 70.2 kg    Exam: Patient is examined daily including today on 11/17/2019, exams remain the same as of yesterday except that has changed    General:  NAD, not oriented to time  Cardiovascular: RRR  Respiratory: CTABL  Abdomen: Soft/ND/NT, positive BS  Musculoskeletal: No Edema  Neuro: alert, oriented to place and person, not to time, immediate recall 2/3.  Data Reviewed: Basic Metabolic Panel: No results for input(s): NA, K, CL, CO2, GLUCOSE, BUN, CREATININE,  CALCIUM, MG, PHOS in the last 168 hours. Liver Function Tests: No results for input(s): AST, ALT, ALKPHOS, BILITOT, PROT, ALBUMIN in the last 168 hours. No results for input(s): LIPASE, AMYLASE in the  last 168 hours. No results for input(s): AMMONIA in the last 168 hours. CBC: No results for input(s): WBC, NEUTROABS, HGB, HCT, MCV, PLT in the last 168 hours. Cardiac Enzymes:   No results for input(s): CKTOTAL, CKMB, CKMBINDEX, TROPONINI in the last 168 hours. BNP (last 3 results) No results for input(s): BNP in the last 8760 hours.  ProBNP (last 3 results) No results for input(s): PROBNP in the last 8760 hours.  CBG: No results for input(s): GLUCAP in the last 168 hours.  No results found for this or any previous visit (from the past 240 hour(s)).   Studies: No results found.  Scheduled Meds: .  stroke: mapping our early stages of recovery book   Does not apply Once  . aspirin  325 mg Oral Daily  . atorvastatin  20 mg Oral q1800  . enoxaparin (LOVENOX) injection  40 mg Subcutaneous Q24H  . feeding supplement (ENSURE ENLIVE)  237 mL Oral BID BM  . folic acid  1 mg Oral Daily  . lactulose  20 g Oral BID  . metoprolol tartrate  25 mg Oral BID  . multivitamin with minerals  1 tablet Oral Daily  . omega-3 acid ethyl esters  1 g Oral Daily  . pantoprazole  40 mg Oral Daily  . thiamine  100 mg Oral Daily   Or  . thiamine  100 mg Intravenous Daily    Continuous Infusions:   Time spent: 15mins I have personally reviewed and interpreted on  11/17/2019 daily labs,  imagings as discussed above under date review session and assessment and plans.  I reviewed all nursing notes, pharmacy notes, consultant notes,  vitals, pertinent old records  I have discussed plan of care as described above with RN , patient on 11/17/2019   Albertine GratesFang Tamarion Haymond MD, PhD, FACP  [facility]  If 7PM-7AM, please contact night-coverage at [email or web address], password [email or web address] 11/17/2019, 1:37 PM  LOS: 117 days

## 2019-11-18 NOTE — Progress Notes (Signed)
PROGRESS NOTE  Tyler [name] QMV:[id number] DOB: [birth year] DOA: 07/23/2019 PCP: Patient, No Pcp Per  Brief Narrative: Tyler [name] is a 48 y.o. male with a history of polysubstance abuse who presented to [facility] ER after being found down with a syringe nearby. He was given Narcan with minimal improvement. On presentation, he was found to have severe rhabdomyolysis with acute kidney injury and hyperkalemia. He was started on IV fluids including bicarb drip and transferred to [facility] for further care. Rhabdomyolysis improved but he had persistence of encephalopathy and was found to have acute bilateral cerebellar infarct. Neurology was consulted and has subsequently signed off. PT recommended SNF placement. Prolonged hospitalization due to placement issues.   Patient is medically stable for discharge. Barriers to discharge currently are patient's cognitive status secondary to his issues as below as well as lack of ability to gain guardianship at this point as he does not have any family members willing to take care of him and patient is a difficult placement at SNF or ALF.    HPI/Recap of past 24 hours:  On acute event overnight  He is sitting up at the edge of bed, denies pain, he is frustrated that  He has been waiting for placement but so far there is no place to take him  Assessment/Plan: Principal Problem:   Cerebral embolism with cerebral infarction Active Problems:   Rhabdomyolysis   Acute kidney failure (HCC)   Acute metabolic encephalopathy   Polysubstance abuse (HCC)   Hyperkalemia   Transaminitis   HTN (hypertension)   Protein-calorie malnutrition, severe  Acute bilateral ischemic CVA, nonhemorrhagic: Neurology has signed off. Carotid duplex wnl, LDL 83, A1c 5.1%. No cardioembolic source on echocardiogram (LVEF 60 - 65%). Negative LE dopplers - Stable with new baseline cognitive deficits which are prohibiting his ability to live independently - Continue  ASA, statin, risk factor control.  - Neurology recommended considering 30-day cardiac event monitoring as outpatient to rule out A. fib if patient neurologically improves  Cognitive deficit secondary to above: Per psychiatry: Patient does not have mental capacity to make an informed decision and recommending someone assume guardianship for him. Unfortunately this is very difficult as so far no one has been willing to assume guardianship. - At present patient's daughter has a significant substance abuse problem and is unable to manage the care of the patient, patient's son is also unable to manage care.  At this juncture there is no one who can assist with 24/7 care of this patient. 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 CT of the head and neck was initially negative for any traumatic finding. UDS, Tylenol and salicylate levels were negative. TSH, folate, B12 levels all within normal limits. EEG: Generalized diffuse slowing but no seizure activity -Had required intermittent restraints initially during the hospitalization, now without requirements. Continue monitoring.  Severe rhabdomyolysis/AKI: Resolved with IV fluids. Renal ultrasound negative.  - Remains off IV fluids with stable UOP. Monitor Scr intermittently.   Transaminitis with elevated bilirubin: Probably from alcohol abuse. Right upper quadrant ultrasound showed hepatic steatosis with early cirrhosis with some gallbladder thickening - Resolved.   Alcohol abuse - Advised to quit.  - Continue thiamine, multivitamin and folic acid  UTI: Present on admission. Completed 5 days of Rocephin  Essential hypertension: Stable.  -Continue metoprolol. Refuses medications   Severe protein calorie malnutrition - Follow nutrition  recommendations  Abdominal pain/back pain:  Resolved. X-ray showed unchanged T11 compression fracture, age-indeterminate L1 fracture - Continue Voltaren gel as needed.  Generalized deconditioning: Likely secondary to alcoholism, CVA, prolonged hospitalization -PT, OT recommending SNF -Last PT evaluation was on 11/30: Patient's visual acuity had improved-he continued to require reorientation and max directional verbal cues to complete tasks.  He was able to find his room with minimal cueing which was improved over the previous week.  Patient continued to have lack of insight regarding his underlying deficits and safety was documented as unsafe to return to home alone.  This was supported by patient's recent psychiatric evaluation as well  DVT prophylaxis: Lovenox, he has been refusing Code Status: Full Family Communication: None available. Please see note dated 12/3 from previous attending physician. Initially contacted patient's aunt who then proceeded to give physician number of patient's daughter. Unfortunately this phone had been left at the residence of the patient's previous boyfriend to answer the phone and updated physician regarding patient's daughter. Apparently patient's daughter has a significant substance abuse problem and this is the rationale for why she unexpectedly left boyfriend who now has possession of her phone. Boyfriend [name] stated he would like to be helpful butwould be unable to take care of patient full-time. Social worker was updated by physician regarding above situation as well. Disposition Plan: Uncertain. CSW reports they will send out bed requests to ALF and SNFs case discussed with social worker today, no bed offer  Consultants:   Psychiatry  Neurology (signed off on 08/02/2019)  Procedures: Echocardiogram 1. The left ventricle has normal systolic function with an ejection fraction of 60-65%. The cavity size was normal. Left ventricular diastolic Doppler parameters are consistent with impaired  relaxation. No evidence of left ventricular regional wall motion abnormalities. 2. The right ventricle has normal systolic function. The cavity was normal. There is no increase in right ventricular wall thickness. Right ventricular systolic pressure is normal with an estimated pressure of 21.0 mmHg. 3. The aortic valve is tricuspid. Moderate sclerosis of the aortic valve. 4. The aorta is normal unless otherwise noted.  EEG This study issuggestive of moderate diffuse encephalopathy which is nonspecific to etiology. No seizures or epileptiform discharges were seen throughout the recording.  Cultures: Blood cultures on 8/17 no growth MRSA nasal PCR on 8/18 neg Urine culture on 8/19 multiple contaminants Blood cultures from 8/22 neg Repeat MRSA PCR on 9/19 neg  Antimicrobials:  Ceftriaxone 8/19 - 8/23    Objective: BP 104/69 (BP Location: Right Arm)   Pulse (!) 55   Temp 97.9 F (36.6 C) (Oral)   Resp 18   Ht 5\' 8"  (1.727 m)   Wt 70.2 kg   SpO2 96%   BMI 23.53 kg/m   Intake/Output Summary (Last 24 hours) at 11/18/2019 1610 Last data filed at 11/18/2019 0600 Gross per 24 hour  Intake 1077 ml  Output 0 ml  Net 1077 ml   Filed Weights   11/16/19 0500 11/17/19 2127 11/18/19 0500  Weight: 70.2 kg 70.2 kg 70.2 kg    Exam: Patient is examined daily including today on 11/18/2019, exams remain the same as of yesterday except that has changed    General:  NAD, not oriented to time  Cardiovascular: RRR  Respiratory: CTABL  Abdomen: Soft/ND/NT, positive BS  Musculoskeletal: No Edema  Neuro: alert, oriented to place and person, not to time, immediate recall 2/3.  Data Reviewed: Basic Metabolic Panel: No results for input(s): NA, K, CL, CO2, GLUCOSE, BUN,  CREATININE, CALCIUM, MG, PHOS in the last 168 hours. Liver Function Tests: No results for input(s): AST, ALT, ALKPHOS, BILITOT, PROT, ALBUMIN in the last 168 hours. No results for input(s): LIPASE, AMYLASE in  the last 168 hours. No results for input(s): AMMONIA in the last 168 hours. CBC: No results for input(s): WBC, NEUTROABS, HGB, HCT, MCV, PLT in the last 168 hours. Cardiac Enzymes:   No results for input(s): CKTOTAL, CKMB, CKMBINDEX, TROPONINI in the last 168 hours. BNP (last 3 results) No results for input(s): BNP in the last 8760 hours.  ProBNP (last 3 results) No results for input(s): PROBNP in the last 8760 hours.  CBG: No results for input(s): GLUCAP in the last 168 hours.  No results found for this or any previous visit (from the past 240 hour(s)).   Studies: No results found.  Scheduled Meds: .  stroke: mapping our early stages of recovery book   Does not apply Once  . aspirin  325 mg Oral Daily  . atorvastatin  20 mg Oral q1800  . enoxaparin (LOVENOX) injection  40 mg Subcutaneous Q24H  . feeding supplement (ENSURE ENLIVE)  237 mL Oral BID BM  . folic acid  1 mg Oral Daily  . lactulose  20 g Oral BID  . metoprolol tartrate  25 mg Oral BID  . multivitamin with minerals  1 tablet Oral Daily  . omega-3 acid ethyl esters  1 g Oral Daily  . pantoprazole  40 mg Oral Daily  . thiamine  100 mg Oral Daily   Or  . thiamine  100 mg Intravenous Daily    Continuous Infusions:   Time spent: 15mins I have personally reviewed and interpreted on  11/18/2019 daily labs,  imagings as discussed above under date review session and assessment and plans.  I reviewed all nursing notes, pharmacy notes, consultant notes,  vitals, pertinent old records  I have discussed plan of care as described above with RN , patient on 11/18/2019   Albertine GratesFang Kerin Kren MD, PhD, FACP  [facility]  If 7PM-7AM, please contact night-coverage at [email or web address], password [email or web address] 11/18/2019, 8:39 AM  LOS: 118 days

## 2019-11-19 NOTE — Plan of Care (Signed)
  Problem: Health Behavior/Discharge Planning: Goal: Ability to manage health-related needs will improve Outcome: Progressing   

## 2019-11-19 NOTE — Progress Notes (Signed)
Nutrition Follow-up  DOCUMENTATION CODES:   Severe malnutrition in context of acute illness/injury  INTERVENTION:   - Continue Ensure Enlive po BID, each supplement provides 350 kcal and 20 grams of protein  - Continue Magic cup TID with meals, each supplement provides 290 kcal and 9 grams of protein  - Continue MVI with minerals daily  NUTRITION DIAGNOSIS:   Severe Malnutrition related to acute illness (acute toxic/metabolic encephalopathy) as evidenced by moderate fat depletion, severe fat depletion, moderate muscle depletion, severe muscle depletion, energy intake < or equal to 50% for > or equal to 5 days, percent weight loss.  Ongoing  GOAL:   Patient will meet greater than or equal to 90% of their needs  Progressing  MONITOR:   PO intake, Supplement acceptance, Labs, Weight trends, Skin, I & O's  REASON FOR ASSESSMENT:   LOS    ASSESSMENT:   Raysean Graumann is a 48 y.o. male with medical history significant of polysubstance abuse including heroin.  Brought in to [facility] ED just before MN after being found down by friend last evening with nearby syringe.  Pt is medically stable for discharge, awaiting SNF placement.  Appetite picking back up. Meal completions charted as 50-100% for his last 6 meals. Pt reports drinking Ensure 1-2 times daily and would like to continue. Weight trending up. Having BMs.   Admission weight: 73 kg  Current weight: 70.2 kg    Medications: folic acid, lactulose, MVI with minerals, thimaine Labs: CBG 96-106  Diet Order:   Diet Order            DIET DYS 3 Room service appropriate? No; Fluid consistency: Thin  Diet effective now              EDUCATION NEEDS:   Not appropriate for education at this time  Skin:  Skin Assessment: Reviewed RN Assessment  Last BM:  12/14  Height:   Ht Readings from Last 1 Encounters:  08/09/19 5\' 8"  (1.727 m)    Weight:   Wt Readings from Last 1 Encounters:  11/19/19 70.2 kg    Ideal  Body Weight:  70 kg  BMI:  Body mass index is 23.53 kg/m.  Estimated Nutritional Needs:   Kcal:  2000-2200  Protein:  105-120 grams  Fluid:  > 2 L   Mariana Single RD, LDN Clinical Nutrition Pager # [phone]

## 2019-11-19 NOTE — TOC Progression Note (Signed)
Transition of Care [facility]) - Progression Note    Patient Details  Name: Milt Coye MRN: [id number] Date of Birth: 1971/04/15  Transition of Care [facility]) CM/SW Contact  Sharlet Salina Mila Homer, LCSW Phone Number: 11/19/2019, 3:03 PM  Clinical Narrative: Call made to [facility] [phone]) and number disconnected or no longer in service. Call made to [phone]) and talked with Thayer Headings who requested that clinicals be faxed to their facility. CSW will continue search for [facility]/ALF placement..      Expected Discharge Plan: Skilled Nursing Facility Barriers to Discharge: Active Substance Use - Placement, SNF Pending payor source - LOG, SNF Pending bed offer  Expected Discharge Plan and Services Expected Discharge Plan: [place]   Discharge Planning Services: CM Consult, Other - See comment(Pt will need LOG for SNF placement) Post Acute Care Choice: [place] arrangements for the past 2 months: Single Family Home                 DME Arranged: N/A           HH Agency: NA         Social Determinants of Health (SDOH) Interventions  Patient needs facility placement as can no longer care for himself per family.   Readmission Risk Interventions No flowsheet data found.

## 2019-11-19 NOTE — Progress Notes (Signed)
PROGRESS NOTE  Tyler [name] ZHG:[id number] DOB: 08/01/1971 DOA: 07/23/2019 PCP: Patient, No Pcp Per  Brief Narrative: Tyler [name] is a 48 y.o. male with a history of polysubstance abuse who presented to [facility] ER after being found down with a syringe nearby. He was given Narcan with minimal improvement. On presentation, he was found to have severe rhabdomyolysis with acute kidney injury and hyperkalemia. He was started on IV fluids including bicarb drip and transferred to [facility] for further care. Rhabdomyolysis improved but he had persistence of encephalopathy and was found to have acute bilateral cerebellar infarct. Neurology was consulted and has subsequently signed off. PT recommended SNF placement. Prolonged hospitalization due to placement issues.   Patient is medically stable for discharge. Barriers to discharge currently are patient's cognitive status secondary to his issues as below as well as lack of ability to gain guardianship at this point as he does not have any family members willing to take care of him and patient is a difficult placement at SNF or ALF.  Assessment/Plan: Principal Problem:   Cerebral embolism with cerebral infarction Active Problems:   Rhabdomyolysis   Acute kidney failure (HCC)   Acute metabolic encephalopathy   Polysubstance abuse (HCC)   Hyperkalemia   Transaminitis   HTN (hypertension)   Protein-calorie malnutrition, severe  Acute bilateral ischemic CVA, nonhemorrhagic: Neurology has signed off. Carotid duplex wnl, LDL 83, A1c 5.1%. No cardioembolic source on echocardiogram (LVEF 60 - 65%). Negative LE dopplers - Stable with new baseline cognitive deficits which are prohibiting his ability to live independently - Continue ASA, statin, risk factor control.  - Neurology recommended considering 30-day cardiac event monitoring as outpatient to rule out A. fib if patient neurologically improves  Cognitive deficit secondary to above:  Per psychiatry: Patient does not have mental capacity to make an informed decision and recommending someone assume guardianship for him. Unfortunately this is very difficult as so far no one has been willing to assume guardianship. - At present patient's daughter has a significant substance abuse problem and is unable to manage the care of the patient, patient's son is also unable to manage care.  At this juncture there is no one who can assist with 24/7 care of this patient. 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 CT of the head and neck was initially negative for any traumatic finding. UDS, Tylenol and salicylate levels were negative. TSH, folate, B12 levels all within normal limits. EEG: Generalized diffuse slowing but no seizure activity -Had required intermittent restraints initially during the hospitalization, now without requirements. Continue monitoring.  Severe rhabdomyolysis/AKI: Resolved with IV fluids. Renal ultrasound negative.  - Remains off IV fluids with stable UOP. Monitor Scr intermittently.   Transaminitis with elevated bilirubin: Probably from alcohol abuse. Right upper quadrant ultrasound showed hepatic steatosis with early cirrhosis with some gallbladder thickening - Resolved.   Alcohol abuse - Advised to quit.  - Continue thiamine, multivitamin and folic acid  UTI: Present on admission. Completed 5 days of Rocephin  Essential hypertension: Stable.  -Continue metoprolol. Refuses medications   Severe protein calorie malnutrition - Follow nutrition recommendations  Abdominal pain/back pain: Resolved. X-ray showed unchanged T11 compression fracture, age-indeterminate L1 fracture - Continue Voltaren gel as needed.  Generalized deconditioning: Likely secondary to alcoholism, CVA, prolonged  hospitalization -PT, OT recommending SNF -Last PT evaluation was on 11/30: Patient's visual acuity had  improved-he continued to require reorientation and max directional verbal cues to complete tasks.  He was able to find his room with minimal cueing which was improved over the previous week.  Patient continued to have lack of insight regarding his underlying deficits and safety was documented as unsafe to return to home alone.  This was supported by patient's recent psychiatric evaluation as well  DVT prophylaxis: Lovenox, he has been refusing Code Status: Full Family Communication: None available. Please see note dated 12/3 from previous attending physician. Initially contacted patient's aunt who then proceeded to give physician number of patient's daughter. Unfortunately this phone had been left at the residence of the patient's previous boyfriend to answer the phone and updated physician regarding patient's daughter. Apparently patient's daughter has a significant substance abuse problem and this is the rationale for why she unexpectedly left boyfriend who now has possession of her phone. Boyfriend Greig Castillandrew stated he would like to be helpful butwould be unable to take care of patient full-time. Social worker was updated by physician regarding above situation as well. Disposition Plan: Uncertain. CSW reports they will send out bed requests to ALF and SNFs case discussed with social worker today, no bed offer  Consultants:   Psychiatry  Neurology (signed off on 08/02/2019)  Procedures: Echocardiogram 1. The left ventricle has normal systolic function with an ejection fraction of 60-65%. The cavity size was normal. Left ventricular diastolic Doppler parameters are consistent with impaired relaxation. No evidence of left ventricular regional wall motion abnormalities. 2. The right ventricle has normal systolic function. The cavity was normal. There is no increase in right ventricular wall  thickness. Right ventricular systolic pressure is normal with an estimated pressure of 21.0 mmHg. 3. The aortic valve is tricuspid. Moderate sclerosis of the aortic valve. 4. The aorta is normal unless otherwise noted.  EEG This study issuggestive of moderate diffuse encephalopathy which is nonspecific to etiology. No seizures or epileptiform discharges were seen throughout the recording.  Cultures: Blood cultures on 8/17 no growth MRSA nasal PCR on 8/18 neg Urine culture on 8/19 multiple contaminants Blood cultures from 8/22 neg Repeat MRSA PCR on 9/19 neg  Antimicrobials:  Ceftriaxone 8/19 - 8/23  Subjective: Patient seen and examined.  He is alert but oriented x1 only.  No complaints.  Objective: BP 120/82 (BP Location: Right Arm)   Pulse 67   Temp 97.6 F (36.4 C) (Oral)   Resp 16   Ht 5\' 8"  (1.727 m)   Wt 70.2 kg   SpO2 97%   BMI 23.53 kg/m   Intake/Output Summary (Last 24 hours) at 11/19/2019 1018 Last data filed at 11/19/2019 0831 Gross per 24 hour  Intake 1080 ml  Output 0 ml  Net 1080 ml   Filed Weights   11/18/19 0500 11/18/19 2106 11/19/19 0500  Weight: 70.2 kg 70.2 kg 70.2 kg    Exam: General exam: Appears calm and comfortable  Respiratory system: Clear to auscultation. Respiratory effort normal. Cardiovascular system: S1 & S2 heard, RRR. No JVD, murmurs, rubs, gallops or clicks. No pedal edema. Gastrointestinal system: Abdomen is nondistended, soft and nontender. No organomegaly or masses felt. Normal bowel sounds heard. Central nervous system: Alert and oriented x1. No focal neurological deficits. Extremities: Symmetric 5 x 5 power. Skin: No rashes, lesions or ulcers.  Psychiatry: Judgement and insight appear poor. Mood & affect agitated.  Data Reviewed: Basic Metabolic Panel: No results for input(s): NA, K, CL, CO2, GLUCOSE, BUN, CREATININE, CALCIUM, MG, PHOS in the last  168 hours. Liver Function Tests: No results for input(s): AST, ALT,  ALKPHOS, BILITOT, PROT, ALBUMIN in the last 168 hours. No results for input(s): LIPASE, AMYLASE in the last 168 hours. No results for input(s): AMMONIA in the last 168 hours. CBC: No results for input(s): WBC, NEUTROABS, HGB, HCT, MCV, PLT in the last 168 hours. Cardiac Enzymes:   No results for input(s): CKTOTAL, CKMB, CKMBINDEX, TROPONINI in the last 168 hours. BNP (last 3 results) No results for input(s): BNP in the last 8760 hours.  ProBNP (last 3 results) No results for input(s): PROBNP in the last 8760 hours.  CBG: No results for input(s): GLUCAP in the last 168 hours.  No results found for this or any previous visit (from the past 240 hour(s)).   Studies: No results found.  Scheduled Meds: .  stroke: mapping our early stages of recovery book   Does not apply Once  . aspirin  325 mg Oral Daily  . atorvastatin  20 mg Oral q1800  . enoxaparin (LOVENOX) injection  40 mg Subcutaneous Q24H  . feeding supplement (ENSURE ENLIVE)  237 mL Oral BID BM  . folic acid  1 mg Oral Daily  . lactulose  20 g Oral BID  . metoprolol tartrate  25 mg Oral BID  . multivitamin with minerals  1 tablet Oral Daily  . omega-3 acid ethyl esters  1 g Oral Daily  . pantoprazole  40 mg Oral Daily  . thiamine  100 mg Oral Daily   Or  . thiamine  100 mg Intravenous Daily   Continuous Infusions:  Time spent: 27 minutes  Hughie Closs MD  [facility]  If 7PM-7AM, please contact night-coverage at [email or web address], password [facility] 11/19/2019, 10:18 AM  LOS: 119 days

## 2019-11-20 LAB — CBC WITH DIFFERENTIAL/PLATELET
Abs Immature Granulocytes: 0.06 10*3/uL (ref 0.00–0.07)
Basophils Absolute: 0.1 10*3/uL (ref 0.0–0.1)
Basophils Relative: 1 %
Eosinophils Absolute: 0.6 10*3/uL — ABNORMAL HIGH (ref 0.0–0.5)
Eosinophils Relative: 6 %
HCT: 47.5 % (ref 39.0–52.0)
Hemoglobin: 15.7 g/dL (ref 13.0–17.0)
Immature Granulocytes: 1 %
Lymphocytes Relative: 28 %
Lymphs Abs: 2.9 10*3/uL (ref 0.7–4.0)
MCH: 30.4 pg (ref 26.0–34.0)
MCHC: 33.1 g/dL (ref 30.0–36.0)
MCV: 92.1 fL (ref 80.0–100.0)
Monocytes Absolute: 1.1 10*3/uL — ABNORMAL HIGH (ref 0.1–1.0)
Monocytes Relative: 11 %
Neutro Abs: 5.5 10*3/uL (ref 1.7–7.7)
Neutrophils Relative %: 53 %
Platelets: 264 10*3/uL (ref 150–400)
RBC: 5.16 MIL/uL (ref 4.22–5.81)
RDW: 11.9 % (ref 11.5–15.5)
WBC: 10.2 10*3/uL (ref 4.0–10.5)
nRBC: 0 % (ref 0.0–0.2)

## 2019-11-20 NOTE — Progress Notes (Signed)
PROGRESS NOTE  Tyler [name] ZCH:[id number] DOB: 1971/02/06 DOA: 07/23/2019 PCP: Patient, No Pcp Per  Brief Narrative: Tyler [name] is a 48 y.o. male with a history of polysubstance abuse who presented to [facility] ER after being found down with a syringe nearby. He was given Narcan with minimal improvement. On presentation, he was found to have severe rhabdomyolysis with acute kidney injury and hyperkalemia. He was started on IV fluids including bicarb drip and transferred to [facility] for further care. Rhabdomyolysis improved but he had persistence of encephalopathy and was found to have acute bilateral cerebellar infarct. Neurology was consulted and has subsequently signed off. PT recommended SNF placement. Prolonged hospitalization due to placement issues.   Patient is medically stable for discharge. Barriers to discharge currently are patient's cognitive status secondary to his issues as below as well as lack of ability to gain guardianship at this point as he does not have any family members willing to take care of him and patient is a difficult placement at SNF or ALF.  Assessment/Plan: Principal Problem:   Cerebral embolism with cerebral infarction Active Problems:   Rhabdomyolysis   Acute kidney failure (HCC)   Acute metabolic encephalopathy   Polysubstance abuse (HCC)   Hyperkalemia   Transaminitis   HTN (hypertension)   Protein-calorie malnutrition, severe  Acute bilateral ischemic CVA, nonhemorrhagic: Neurology has signed off. Carotid duplex wnl, LDL 83, A1c 5.1%. No cardioembolic source on echocardiogram (LVEF 60 - 65%). Negative LE dopplers - Stable with new baseline cognitive deficits which are prohibiting his ability to live independently - Continue ASA, statin, risk factor control.  - Neurology recommended considering 30-day cardiac event monitoring as outpatient to rule out A. fib if patient neurologically improves  Cognitive deficit secondary to above:  Per psychiatry: Patient does not have mental capacity to make an informed decision and recommending someone assume guardianship for him. Unfortunately this is very difficult as so far no one has been willing to assume guardianship. - At present patient's daughter has a significant substance abuse problem and is unable to manage the care of the patient, patient's son is also unable to manage care.  At this juncture there is no one who can assist with 24/7 care of this patient. 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 CT of the head and neck was initially negative for any traumatic finding. UDS, Tylenol and salicylate levels were negative. TSH, folate, B12 levels all within normal limits. EEG: Generalized diffuse slowing but no seizure activity -Had required intermittent restraints initially during the hospitalization, now without requirements. Continue monitoring.  Severe rhabdomyolysis/AKI: Resolved with IV fluids. Renal ultrasound negative.  - Remains off IV fluids with stable UOP. Monitor Scr intermittently.   Transaminitis with elevated bilirubin: Probably from alcohol abuse. Right upper quadrant ultrasound showed hepatic steatosis with early cirrhosis with some gallbladder thickening - Resolved.   Alcohol abuse - Advised to quit.  - Continue thiamine, multivitamin and folic acid  UTI: Present on admission. Completed 5 days of Rocephin  Essential hypertension: Stable.  -Continue metoprolol. Refuses medications   Severe protein calorie malnutrition - Follow nutrition recommendations  Abdominal pain/back pain: Resolved. X-ray showed unchanged T11 compression fracture, age-indeterminate L1 fracture - Continue Voltaren gel as needed.  Generalized deconditioning: Likely secondary to alcoholism, CVA, prolonged  hospitalization -PT, OT recommending SNF -Last PT evaluation was on 11/30: Patient's visual acuity had  improved-he continued to require reorientation and max directional verbal cues to complete tasks.  He was able to find his room with minimal cueing which was improved over the previous week.  Patient continued to have lack of insight regarding his underlying deficits and safety was documented as unsafe to return to home alone.  This was supported by patient's recent psychiatric evaluation as well  DVT prophylaxis: Lovenox, he has been refusing Code Status: Full Family Communication: None available. Please see note dated 12/3 from previous attending physician. Initially contacted patient's aunt who then proceeded to give physician number of patient's daughter. Unfortunately this phone had been left at the residence of the patient's previous boyfriend to answer the phone and updated physician regarding patient's daughter. Apparently patient's daughter has a significant substance abuse problem and this is the rationale for why she unexpectedly left boyfriend who now has possession of her phone. Boyfriend Greig Castillandrew stated he would like to be helpful butwould be unable to take care of patient full-time. Social worker was updated by physician regarding above situation as well. Disposition Plan: Uncertain. CSW reports they will send out bed requests to ALF and SNFs case discussed with social worker today, no bed offer  Consultants:   Psychiatry  Neurology (signed off on 08/02/2019)  Procedures: Echocardiogram 1. The left ventricle has normal systolic function with an ejection fraction of 60-65%. The cavity size was normal. Left ventricular diastolic Doppler parameters are consistent with impaired relaxation. No evidence of left ventricular regional wall motion abnormalities. 2. The right ventricle has normal systolic function. The cavity was normal. There is no increase in right ventricular wall  thickness. Right ventricular systolic pressure is normal with an estimated pressure of 21.0 mmHg. 3. The aortic valve is tricuspid. Moderate sclerosis of the aortic valve. 4. The aorta is normal unless otherwise noted.  EEG This study issuggestive of moderate diffuse encephalopathy which is nonspecific to etiology. No seizures or epileptiform discharges were seen throughout the recording.  Cultures: Blood cultures on 8/17 no growth MRSA nasal PCR on 8/18 neg Urine culture on 8/19 multiple contaminants Blood cultures from 8/22 neg Repeat MRSA PCR on 9/19 neg  Antimicrobials:  Ceftriaxone 8/19 - 8/23  Subjective: Seen and examined.  Alert and oriented x2 today.  No complaints.  Objective: BP 100/66 (BP Location: Right Arm)   Pulse 64   Temp 97.9 F (36.6 C) (Oral)   Resp 18   Ht 5\' 8"  (1.727 m)   Wt 71.4 kg   SpO2 98%   BMI 23.93 kg/m   Intake/Output Summary (Last 24 hours) at 11/20/2019 1221 Last data filed at 11/20/2019 0700 Gross per 24 hour  Intake 320 ml  Output 500 ml  Net -180 ml   Filed Weights   11/18/19 2106 11/19/19 0500 11/20/19 0529  Weight: 70.2 kg 70.2 kg 71.4 kg    Exam: General exam: Appears calm and comfortable  Respiratory system: Clear to auscultation. Respiratory effort normal. Cardiovascular system: S1 & S2 heard, RRR. No JVD, murmurs, rubs, gallops or clicks. No pedal edema. Gastrointestinal system: Abdomen is nondistended, soft and nontender. No organomegaly or masses felt. Normal bowel sounds heard. Central nervous system: Alert and oriented x2. No focal neurological deficits. Extremities: Symmetric 5 x 5 power. Skin: No rashes, lesions or ulcers.  Psychiatry: Judgement and insight appear poor. Mood & affect irritable  Data Reviewed: Basic Metabolic Panel: No results for input(s): NA, K, CL, CO2, GLUCOSE, BUN, CREATININE, CALCIUM, MG, PHOS in the last 168 hours. Liver  Function Tests: No results for input(s): AST, ALT, ALKPHOS,  BILITOT, PROT, ALBUMIN in the last 168 hours. No results for input(s): LIPASE, AMYLASE in the last 168 hours. No results for input(s): AMMONIA in the last 168 hours. CBC: No results for input(s): WBC, NEUTROABS, HGB, HCT, MCV, PLT in the last 168 hours. Cardiac Enzymes:   No results for input(s): CKTOTAL, CKMB, CKMBINDEX, TROPONINI in the last 168 hours. BNP (last 3 results) No results for input(s): BNP in the last 8760 hours.  ProBNP (last 3 results) No results for input(s): PROBNP in the last 8760 hours.  CBG: No results for input(s): GLUCAP in the last 168 hours.  No results found for this or any previous visit (from the past 240 hour(s)).   Studies: No results found.  Scheduled Meds: .  stroke: mapping our early stages of recovery book   Does not apply Once  . aspirin  325 mg Oral Daily  . atorvastatin  20 mg Oral q1800  . enoxaparin (LOVENOX) injection  40 mg Subcutaneous Q24H  . feeding supplement (ENSURE ENLIVE)  237 mL Oral BID BM  . folic acid  1 mg Oral Daily  . lactulose  20 g Oral BID  . metoprolol tartrate  25 mg Oral BID  . multivitamin with minerals  1 tablet Oral Daily  . omega-3 acid ethyl esters  1 g Oral Daily  . pantoprazole  40 mg Oral Daily  . thiamine  100 mg Oral Daily   Or  . thiamine  100 mg Intravenous Daily   Continuous Infusions:  Time spent: 22 minutes  Hughie Closs MD  [facility]  If 7PM-7AM, please contact night-coverage at [email or web address], password [facility] 11/20/2019, 12:21 PM  LOS: 120 days

## 2019-11-21 LAB — BASIC METABOLIC PANEL
Anion gap: 10 (ref 5–15)
BUN: 25 mg/dL — ABNORMAL HIGH (ref 6–20)
CO2: 26 mmol/L (ref 22–32)
Calcium: 9.5 mg/dL (ref 8.9–10.3)
Chloride: 104 mmol/L (ref 98–111)
Creatinine, Ser: 1.28 mg/dL — ABNORMAL HIGH (ref 0.61–1.24)
GFR calc Af Amer: >60 mL/min (ref >60)
GFR calc non Af Amer: >60 mL/min (ref >60)
Glucose, Bld: 98 mg/dL (ref 70–99)
Potassium: 3.9 mmol/L (ref 3.5–5.1)
Sodium: 140 mmol/L (ref 135–145)

## 2019-11-21 NOTE — Plan of Care (Signed)
  Problem: Education: Goal: Knowledge of General Education information will improve Description Including pain rating scale, medication(s)/side effects and non-pharmacologic comfort measures Outcome: Progressing   

## 2019-11-21 NOTE — TOC Progression Note (Signed)
Transition of Care [facility]) - Progression Note    Patient Details  Name: Tyler [name] MRN: [id number] Date of Birth: [birth year]  Transition of Care [facility]) CM/SW Contact  Bartholomew Crews, RN Phone Number: [phone] 11/21/2019, 4:21 PM  Clinical Narrative:    Left voicemail for DSS concerning patient lack of guardian and anyone willing and or able to make decisions. NCM and CSW contact information provided. TOC team following for transition needs.    Expected Discharge Plan: Skilled Nursing Facility Barriers to Discharge: Active Substance Use - Placement, SNF Pending payor source - LOG, SNF Pending bed offer  Expected Discharge Plan and Services Expected Discharge Plan: [place]   Discharge Planning Services: CM Consult, Other - See comment(Pt will need LOG for SNF placement) Post Acute Care Choice: [place] arrangements for the past 2 months: Single Family Home                 DME Arranged: N/A           HH Agency: NA         Social Determinants of Health (SDOH) Interventions    Readmission Risk Interventions No flowsheet data found.

## 2019-11-21 NOTE — Progress Notes (Signed)
PROGRESS NOTE  Tyler [name] ZOX:[id number] DOB: 08/22/1971 DOA: 07/23/2019 PCP: Patient, No Pcp Per  Brief Narrative: Tyler AppleSteven Atwood is a 48 y.o. male with a history of polysubstance abuse who [facility] after being found down with a syringe nearby. He was given Narcan with minimal improvement. On presentation, he was found to have severe rhabdomyolysis with acute kidney injury and hyperkalemia. He was started on IV fluids including bicarb drip and transferred to [facility] for further care. Rhabdomyolysis improved but he had persistence of encephalopathy and was found to have acute bilateral cerebellar infarct. Neurology was consulted and has subsequently signed off. PT recommended SNF placement. Prolonged hospitalization due to placement issues.   Patient is medically stable for discharge. Barriers to discharge currently are patient's cognitive status secondary to his issues as below as well as lack of ability to gain guardianship at this point as he does not have any family members willing to take care of him and patient is a difficult placement at SNF or ALF.  Case management attempting to reach DSS for guardianship issues.  Assessment/Plan: Principal Problem:   Cerebral embolism with cerebral infarction Active Problems:   Rhabdomyolysis   Acute kidney failure (HCC)   Acute metabolic encephalopathy   Polysubstance abuse (HCC)   Hyperkalemia   Transaminitis   HTN (hypertension)   Protein-calorie malnutrition, severe  Acute bilateral ischemic CVA, nonhemorrhagic: Neurology has signed off. Carotid duplex wnl, LDL 83, A1c 5.1%. No cardioembolic source on echocardiogram (LVEF 60 - 65%). Negative LE dopplers - Stable with new baseline cognitive deficits which are prohibiting his ability to live independently - Continue ASA, statin, risk factor control.  - Neurology recommended considering 30-day cardiac event monitoring as outpatient to rule out A. fib if patient  neurologically improves  Cognitive deficit secondary to above: Per psychiatry: Patient does not have mental capacity to make an informed decision and recommending someone assume guardianship for him. Unfortunately this is very difficult as so far no one has been willing to assume guardianship. - At present patient's daughter has a significant substance abuse problem and is unable to manage the care of the patient, patient's son is also unable to manage care.  At this juncture there is no one who can assist with 24/7 care of this patient. Currently awaiting SNF or ALF bed.  Discussed with TOC team on 12/16.  They will attempt to reach DSS regarding guardianship.  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 CT of the head and neck was initially negative for any traumatic finding. UDS, Tylenol and salicylate levels were negative. TSH, folate, B12 levels all within normal limits. EEG: Generalized diffuse slowing but no seizure activity -Had required intermittent restraints initially during the hospitalization, now without requirements. Continue monitoring.  Severe rhabdomyolysis/AKI: Resolved with IV fluids. Renal ultrasound negative.  - Remains off IV fluids with stable UOP. Monitor Scr intermittently. -Creatinine 1.28 yesterday.  Monitor periodically.  Transaminitis with elevated bilirubin: Probably from alcohol abuse. Right upper quadrant ultrasound showed hepatic steatosis with early cirrhosis with some gallbladder thickening - Resolved.   Alcohol abuse - Advised to quit.  - Continue thiamine, multivitamin and folic acid  UTI: Present on admission. Completed 5 days of Rocephin  Essential hypertension: Stable.  -Continue metoprolol. Refuses medications   Severe protein calorie malnutrition - Follow nutrition recommendations  Abdominal pain/back pain:  Resolved. X-ray showed unchanged T11 compression fracture, age-indeterminate L1  fracture - Continue Voltaren gel as needed.  Generalized deconditioning: Likely secondary to alcoholism, CVA, prolonged hospitalization -PT, OT recommending SNF -Last PT evaluation was on 11/30: Patient's visual acuity had improved-he continued to require reorientation and max directional verbal cues to complete tasks.  He was able to find his room with minimal cueing which was improved over the previous week.  Patient continued to have lack of insight regarding his underlying deficits and safety was documented as unsafe to return to home alone.  This was supported by patient's recent psychiatric evaluation as well  DVT prophylaxis: Lovenox, he has been refusing Code Status: Full Family Communication: None available. Please see note dated 12/3 from previous attending physician. Initially contacted patient's aunt who then proceeded to give physician number of patient's daughter. Unfortunately this phone had been left at the residence of the patient's previous boyfriend to answer the phone and updated physician regarding patient's daughter. Apparently patient's daughter has a significant substance abuse problem and this is the rationale for why she unexpectedly left boyfriend who now has possession of her phone. Boyfriend Greig Castilla stated he would like to be helpful butwould be unable to take care of patient full-time. Social worker was updated by physician regarding above situation as well. Disposition Plan: Uncertain. CSW reports they will send out bed requests to ALF and SNFs case discussed with social worker today, no bed offer  Consultants:   Psychiatry  Neurology (signed off on 08/02/2019)  Procedures: Echocardiogram 1. The left ventricle has normal systolic function with an ejection fraction of 60-65%. The cavity size was normal. Left ventricular diastolic Doppler parameters are consistent with impaired  relaxation. No evidence of left ventricular regional wall motion abnormalities. 2. The right ventricle has normal systolic function. The cavity was normal. There is no increase in right ventricular wall thickness. Right ventricular systolic pressure is normal with an estimated pressure of 21.0 mmHg. 3. The aortic valve is tricuspid. Moderate sclerosis of the aortic valve. 4. The aorta is normal unless otherwise noted.  EEG This study issuggestive of moderate diffuse encephalopathy which is nonspecific to etiology. No seizures or epileptiform discharges were seen throughout the recording.  Cultures: Blood cultures on 8/17 no growth MRSA nasal PCR on 8/18 neg Urine culture on 8/19 multiple contaminants Blood cultures from 8/22 neg Repeat MRSA PCR on 9/19 neg  Antimicrobials:  Ceftriaxone 8/19 - 8/23  Subjective: Patient reports frustration regarding being hospitalized.  "I am here".  As per RN, no acute issues noted.  Objective: BP 103/61 (BP Location: Right Arm)   Pulse 65   Temp 98 F (36.7 C) (Oral)   Resp 18   Ht 5\' 8"  (1.727 m)   Wt 71.7 kg   SpO2 95%   BMI 24.03 kg/m   Intake/Output Summary (Last 24 hours) at 11/21/2019 1855 Last data filed at 11/21/2019 1700 Gross per 24 hour  Intake 1440 ml  Output 500 ml  Net 940 ml   Filed Weights   11/19/19 0500 11/20/19 0529 11/21/19 0437  Weight: 70.2 kg 71.4 kg 71.7 kg    Exam: General exam: Pleasant young male, moderately built and nourished lying comfortably supine in bed without distress. Respiratory system: Clear to auscultation. Respiratory effort normal. Cardiovascular system: S1 & S2 heard, RRR. No JVD, murmurs, rubs, gallops or clicks. No pedal edema. Gastrointestinal system: Abdomen is nondistended, soft and nontender. No organomegaly or masses felt. Normal bowel sounds heard. Central nervous system: Alert and oriented x2. No focal neurological deficits. Extremities: Symmetric 5  x 5 power. Skin: No  rashes, lesions or ulcers.  Psychiatry: Judgement and insight appear poor. Mood & affect not irritable today.  Data Reviewed: Basic Metabolic Panel: Recent Labs  Lab 11/20/19 2325  NA 140  K 3.9  CL 104  CO2 26  GLUCOSE 98  BUN 25*  CREATININE 1.28*  CALCIUM 9.5   Liver Function Tests: No results for input(s): AST, ALT, ALKPHOS, BILITOT, PROT, ALBUMIN in the last 168 hours. No results for input(s): LIPASE, AMYLASE in the last 168 hours. No results for input(s): AMMONIA in the last 168 hours. CBC: Recent Labs  Lab 11/20/19 2325  WBC 10.2  NEUTROABS 5.5  HGB 15.7  HCT 47.5  MCV 92.1  PLT 264     Studies: No results found.  Scheduled Meds: .  stroke: mapping our early stages of recovery book   Does not apply Once  . aspirin  325 mg Oral Daily  . atorvastatin  20 mg Oral q1800  . enoxaparin (LOVENOX) injection  40 mg Subcutaneous Q24H  . feeding supplement (ENSURE ENLIVE)  237 mL Oral BID BM  . folic acid  1 mg Oral Daily  . lactulose  20 g Oral BID  . metoprolol tartrate  25 mg Oral BID  . multivitamin with minerals  1 tablet Oral Daily  . omega-3 acid ethyl esters  1 g Oral Daily  . pantoprazole  40 mg Oral Daily  . thiamine  100 mg Oral Daily   Or  . thiamine  100 mg Intravenous Daily   Continuous Infusions:  Time spent: 15 minutes  Vernell Leep, MD, [place], [facility]. [facility]  To contact the attending provider between 7A-7P or the covering provider during after hours 7P-7A, please log into the web site [email or web address] and access using universal [place] password for that web site. If you do not have the password, please call the hospital operator.

## 2019-11-22 NOTE — Progress Notes (Signed)
PROGRESS NOTE  Tyler [name] ZGY:[id number] DOB: 31-Jul-1971 DOA: 07/23/2019 PCP: Patient, No Pcp Per  Brief Narrative: Tyler [name] is a 48 y.o. male with a history of polysubstance abuse who presented to [facility] ER after being found down with a syringe nearby. He was given Narcan with minimal improvement. On presentation, he was found to have severe rhabdomyolysis with acute kidney injury and hyperkalemia. He was started on IV fluids including bicarb drip and transferred to [facility] for further care. Rhabdomyolysis improved but he had persistence of encephalopathy and was found to have acute bilateral cerebellar infarct. Neurology was consulted and has subsequently signed off. PT recommended SNF placement. Prolonged hospitalization due to placement issues.   Patient is medically stable for discharge. Barriers to discharge currently are patient's cognitive status secondary to his issues as below as well as lack of ability to gain guardianship at this point as he does not have any family members willing to take care of him and patient is a difficult placement at SNF or ALF.  Case management attempting to reach DSS for guardianship issues.  Assessment/Plan: Principal Problem:   Cerebral embolism with cerebral infarction Active Problems:   Rhabdomyolysis   Acute kidney failure (HCC)   Acute metabolic encephalopathy   Polysubstance abuse (HCC)   Hyperkalemia   Transaminitis   HTN (hypertension)   Protein-calorie malnutrition, severe  Acute bilateral ischemic CVA, nonhemorrhagic: Neurology has signed off. Carotid duplex wnl, LDL 83, A1c 5.1%. No cardioembolic source on echocardiogram (LVEF 60 - 65%). Negative LE dopplers - Stable with new baseline cognitive deficits which are prohibiting his ability to live independently - Continue ASA, statin, risk factor control.  - Neurology recommended considering 30-day cardiac event monitoring as outpatient to rule out A. fib if patient  neurologically improves  Cognitive deficit secondary to above: Per psychiatry: Patient does not have mental capacity to make an informed decision and recommending someone assume guardianship for him. Unfortunately this is very difficult as so far no one has been willing to assume guardianship. - At present patient's daughter has a significant substance abuse problem and is unable to manage the care of the patient, patient's son is also unable to manage care.  At this juncture there is no one who can assist with 24/7 care of this patient. Currently awaiting SNF or ALF bed.  Discussed with TOC team on 12/16.  They will attempt to reach DSS regarding guardianship.  No new updates.  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 CT of the head and neck was initially negative for any traumatic finding. UDS, Tylenol and salicylate levels were negative. TSH, folate, B12 levels all within normal limits. EEG: Generalized diffuse slowing but no seizure activity -Had required intermittent restraints initially during the hospitalization, now without requirements. Continue monitoring.  Severe rhabdomyolysis/AKI: Resolved with IV fluids. Renal ultrasound negative.  - Remains off IV fluids with stable UOP. Monitor Scr intermittently. -Creatinine 1.28 yesterday.  Monitor periodically.  Transaminitis with elevated bilirubin: Probably from alcohol abuse. Right upper quadrant ultrasound showed hepatic steatosis with early cirrhosis with some gallbladder thickening - Resolved.   Alcohol abuse - Advised to quit.  - Continue thiamine, multivitamin and folic acid  UTI: Present on admission. Completed 5 days of Rocephin  Essential hypertension: Stable.  -Continue metoprolol. Refuses medications   Severe protein calorie malnutrition - Follow nutrition recommendations  Abdominal  pain/back pain: Resolved. X-ray showed unchanged T11  compression fracture, age-indeterminate L1 fracture - Continue Voltaren gel as needed.  Generalized deconditioning: Likely secondary to alcoholism, CVA, prolonged hospitalization -PT, OT recommending SNF -Last PT evaluation was on 11/30: Patient's visual acuity had improved-he continued to require reorientation and max directional verbal cues to complete tasks.  He was able to find his room with minimal cueing which was improved over the previous week.  Patient continued to have lack of insight regarding his underlying deficits and safety was documented as unsafe to return to home alone.  This was supported by patient's recent psychiatric evaluation as well  DVT prophylaxis: Lovenox, he has been refusing Code Status: Full Family Communication: None available. Please see note dated 12/3 from previous attending physician. Initially contacted patient's aunt who then proceeded to give physician number of patient's daughter. Unfortunately this phone had been left at the residence of the patient's previous boyfriend to answer the phone and updated physician regarding patient's daughter. Apparently patient's daughter has a significant substance abuse problem and this is the rationale for why she unexpectedly left boyfriend who now has possession of her phone. Boyfriend Greig Castillandrew stated he would like to be helpful butwould be unable to take care of patient full-time. Social worker was updated by physician regarding above situation as well. Disposition Plan: Uncertain. CSW reports they will send out bed requests to ALF and SNFs case discussed with social worker today, no bed offer  Consultants:   Psychiatry  Neurology (signed off on 08/02/2019)  Procedures: Echocardiogram 1. The left ventricle has normal systolic function with an ejection fraction of 60-65%. The cavity size was normal. Left ventricular diastolic Doppler parameters are consistent  with impaired relaxation. No evidence of left ventricular regional wall motion abnormalities. 2. The right ventricle has normal systolic function. The cavity was normal. There is no increase in right ventricular wall thickness. Right ventricular systolic pressure is normal with an estimated pressure of 21.0 mmHg. 3. The aortic valve is tricuspid. Moderate sclerosis of the aortic valve. 4. The aorta is normal unless otherwise noted.  EEG This study issuggestive of moderate diffuse encephalopathy which is nonspecific to etiology. No seizures or epileptiform discharges were seen throughout the recording.  Cultures: Blood cultures on 8/17 no growth MRSA nasal PCR on 8/18 neg Urine culture on 8/19 multiple contaminants Blood cultures from 8/22 neg Repeat MRSA PCR on 9/19 neg  Antimicrobials:  Ceftriaxone 8/19 - 8/23  Subjective:  Patient sleeping comfortably this morning.  Did not wake him up.  As per nursing, no acute issues reported.  Objective: BP 115/80 (BP Location: Right Arm)   Pulse 69   Temp 98.2 F (36.8 C) (Oral)   Resp 18   Ht 5\' 8"  (1.727 m)   Wt 71.7 kg   SpO2 100%   BMI 24.04 kg/m   Intake/Output Summary (Last 24 hours) at 11/22/2019 1719 Last data filed at 11/22/2019 0858 Gross per 24 hour  Intake 674 ml  Output 0 ml  Net 674 ml   Filed Weights   11/20/19 0529 11/21/19 0437 11/21/19 2036  Weight: 71.4 kg 71.7 kg 71.7 kg    Exam: General exam: Pleasant young male, moderately built and nourished lying comfortably supine in bed without distress. Respiratory system: Clear to auscultation. Respiratory effort normal. Cardiovascular system: S1 & S2 heard, RRR. No JVD, murmurs, rubs, gallops or clicks. No pedal edema. Gastrointestinal system: Abdomen is nondistended, soft and nontender. No organomegaly or masses felt. Normal bowel sounds heard. Central nervous system: Patient sleeping this morning and  did not wake him up. No focal neurological  deficits. Extremities: Symmetric 5 x 5 power. Skin: No rashes, lesions or ulcers.  Psychiatry: Judgement and insight appear poor. Mood & affect not irritable today.  Data Reviewed: Basic Metabolic Panel: Recent Labs  Lab 11/20/19 2325  NA 140  K 3.9  CL 104  CO2 26  GLUCOSE 98  BUN 25*  CREATININE 1.28*  CALCIUM 9.5   Liver Function Tests: No results for input(s): AST, ALT, ALKPHOS, BILITOT, PROT, ALBUMIN in the last 168 hours. No results for input(s): LIPASE, AMYLASE in the last 168 hours. No results for input(s): AMMONIA in the last 168 hours. CBC: Recent Labs  Lab 11/20/19 2325  WBC 10.2  NEUTROABS 5.5  HGB 15.7  HCT 47.5  MCV 92.1  PLT 264     Studies: No results found.  Scheduled Meds: .  stroke: mapping our early stages of recovery book   Does not apply Once  . aspirin  325 mg Oral Daily  . atorvastatin  20 mg Oral q1800  . enoxaparin (LOVENOX) injection  40 mg Subcutaneous Q24H  . feeding supplement (ENSURE ENLIVE)  237 mL Oral BID BM  . folic acid  1 mg Oral Daily  . lactulose  20 g Oral BID  . metoprolol tartrate  25 mg Oral BID  . multivitamin with minerals  1 tablet Oral Daily  . omega-3 acid ethyl esters  1 g Oral Daily  . pantoprazole  40 mg Oral Daily  . thiamine  100 mg Oral Daily   Or  . thiamine  100 mg Intravenous Daily   Continuous Infusions:  Time spent: 15 minutes  Vernell Leep, MD, [place], [facility]. [facility]  To contact the attending provider between 7A-7P or the covering provider during after hours 7P-7A, please log into the web site [email or web address] and access using universal [place] password for that web site. If you do not have the password, please call the hospital operator.

## 2019-11-22 NOTE — TOC Transition Note (Deleted)
Transition of Care [facility]) - CM/SW Discharge Note   Patient Details  Name: Tyler [name] MRN: [id number] Date of Birth: August 10, 1971  [facility]) CM/SW Contact:  Sable Feil, LCSW Phone Number: 11/22/2019, 2:27 PM   Clinical Narrative:  Reviewed chart from date of admission to acquire background regarding patient and family involvement. CSW received call from [place] (based on VM left by nurse case manager Wendi on 12/16). CSW was provided with number for APS - [phone] (8 am - 5 pm) and after hours number:  [phone]. Called APS number and message left.     Barriers to Discharge: Active Substance Use - Placement, SNF Pending payor source - LOG, SNF Pending bed offer   Patient Goals and CMS Choice   CMS Medicare.gov Compare Post Acute Care list provided to:: Patient Represenative (must comment)(Daughter) Choice offered to / list presented to : Adult Children  Discharge Placement  Seeking Adult Care Home as patient no longer needs SNF and is too young for ALF placement, along with current drug use.                      Discharge Plan and Services   Discharge Planning Services: CM Consult, Other - See comment(Pt will need LOG for SNF placement) Post Acute Care Choice: [place]          DME Arranged: N/A           HH Agency: NA        Social Determinants of Health (SDOH) Interventions  Patient lacks capacity per psych and current living situation with son no longer appropriate a   Readmission Risk Interventions No flowsheet data found.

## 2019-11-22 NOTE — TOC Progression Note (Addendum)
Transition of Care [facility]) - Progression Note    Patient Details  Name: Lawerence Dery MRN: [id number] Date of Birth: [birth year]  [facility] Care [facility]) CM/SW Contact  Sharlet Salina Mila Homer, LCSW Phone Number: 11/22/2019, 2:38 PM  Clinical Narrative:  Reviewed chart from date of admission to acquire background regarding patient and family involvement. CSW received call from [place] (based on VM left by nurse case manager Wendi on 12/16). CSW was provided with number for APS - [phone] (8 am - 5 pm) and after hours number:  [phone]. Called APS number and message left.   Received call from [facility] [place] [phone]) regarding patient. Ms. Alice Reichert took APS report even though patient lives in [place]. She indicated that APS report may be sent to [facility] or they may keep it due to the length of time patient has been in the hospital. CSW will be informed regarding disposition of report.   Expected Discharge Plan: [place] - D/C plan is now Adult Care Home Barriers to Discharge: Active Substance Use - Placement, SNF Pending payor source - LOG, SNF Pending bed offer  Expected Discharge Plan and Services Expected Discharge Plan: [place] now Adult Care Home   Discharge Planning Services: CM Consult, Other - See comment(Pt will need LOG for SNF placement) Post Acute Care Choice: [place] arrangements for the past 2 months: Single Family Home                 DME Arranged: N/A           HH Agency: NA         Social Determinants of Health (SDOH) Interventions   Readmission Risk Interventions No flowsheet data found.

## 2019-11-23 NOTE — Progress Notes (Signed)
PROGRESS NOTE  Tyler [name] TKZ:[id number] DOB: 12-03-1971 DOA: 07/23/2019 PCP: Patient, No Pcp Per  Brief Narrative: Tyler [name] is a 48 y.o. male with a history of polysubstance abuse who presented to [facility] ER after being found down with a syringe nearby. He was given Narcan with minimal improvement. On presentation, he was found to have severe rhabdomyolysis with acute kidney injury and hyperkalemia. He was started on IV fluids including bicarb drip and transferred to [facility] for further care. Rhabdomyolysis improved but he had persistence of encephalopathy and was found to have acute bilateral cerebellar infarct. Neurology was consulted and has subsequently signed off. PT recommended SNF placement. Prolonged hospitalization due to placement issues.   Patient is medically stable for discharge. Barriers to discharge currently are patient's cognitive status secondary to his issues as below as well as lack of ability to gain guardianship at this point as he does not have any family members willing to take care of him and patient is a difficult placement at SNF or ALF.  Case management attempting to reach DSS for guardianship issues.  Patient denies complaints.  Overall no acute issues and remains stable today.  Assessment/Plan: Principal Problem:   Cerebral embolism with cerebral infarction Active Problems:   Rhabdomyolysis   Acute kidney failure (HCC)   Acute metabolic encephalopathy   Polysubstance abuse (HCC)   Hyperkalemia   Transaminitis   HTN (hypertension)   Protein-calorie malnutrition, severe  Acute bilateral ischemic CVA, nonhemorrhagic: Neurology has signed off. Carotid duplex wnl, LDL 83, A1c 5.1%. No cardioembolic source on echocardiogram (LVEF 60 - 65%). Negative LE dopplers - Stable with new baseline cognitive deficits which are prohibiting his ability to live independently - Continue ASA, statin, risk factor control.  - Neurology recommended  considering 30-day cardiac event monitoring as outpatient to rule out A. fib if patient neurologically improves  Cognitive deficit secondary to above: Per psychiatry: Patient does not have mental capacity to make an informed decision and recommending someone assume guardianship for him. Unfortunately this is very difficult as so far no one has been willing to assume guardianship. - At present patient's daughter has a significant substance abuse problem and is unable to manage the care of the patient, patient's son is also unable to manage care.  At this juncture there is no one who can assist with 24/7 care of this patient. Currently awaiting SNF or ALF bed.  Discussed with TOC team on 12/16.  They will attempt to reach DSS regarding guardianship.  No new updates.  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 CT of the head and neck was initially negative for any traumatic finding. UDS, Tylenol and salicylate levels were negative. TSH, folate, B12 levels all within normal limits. EEG: Generalized diffuse slowing but no seizure activity -Had required intermittent restraints initially during the hospitalization, now without requirements. Continue monitoring.  Severe rhabdomyolysis/AKI: Resolved with IV fluids. Renal ultrasound negative.  - Remains off IV fluids with stable UOP. Monitor Scr intermittently. -Creatinine 1.28 yesterday.  Monitor periodically.  Transaminitis with elevated bilirubin: Probably from alcohol abuse. Right upper quadrant ultrasound showed hepatic steatosis with early cirrhosis with some gallbladder thickening - Resolved.   Alcohol abuse - Advised to quit.  - Continue thiamine, multivitamin and folic acid  UTI: Present on admission. Completed 5 days of Rocephin  Essential hypertension: Stable.  -Continue metoprolol. Refuses medications    Severe protein calorie malnutrition -  Follow nutrition recommendations  Abdominal pain/back pain: Resolved. X-ray showed unchanged T11 compression fracture, age-indeterminate L1 fracture - Continue Voltaren gel as needed.  Generalized deconditioning: Likely secondary to alcoholism, CVA, prolonged hospitalization -PT, OT recommending SNF -Last PT evaluation was on 11/30: Patient's visual acuity had improved-he continued to require reorientation and max directional verbal cues to complete tasks.  He was able to find his room with minimal cueing which was improved over the previous week.  Patient continued to have lack of insight regarding his underlying deficits and safety was documented as unsafe to return to home alone.  This was supported by patient's recent psychiatric evaluation as well  DVT prophylaxis: Lovenox, he has been refusing Code Status: Full Family Communication: None available. Please see note dated 12/3 from previous attending physician. Initially contacted patient's aunt who then proceeded to give physician number of patient's daughter. Unfortunately this phone had been left at the residence of the patient's previous boyfriend to answer the phone and updated physician regarding patient's daughter. Apparently patient's daughter has a significant substance abuse problem and this is the rationale for why she unexpectedly left boyfriend who now has possession of her phone. Boyfriend Greig Castillandrew stated he would like to be helpful butwould be unable to take care of patient full-time. Social worker was updated by physician regarding above situation as well. Disposition Plan: Uncertain. CSW reports they will send out bed requests to ALF and SNFs case discussed with social worker today, no bed offer  Consultants:   Psychiatry  Neurology (signed off on 08/02/2019)  Procedures: Echocardiogram 1. The left ventricle has normal systolic function with an ejection fraction of  60-65%. The cavity size was normal. Left ventricular diastolic Doppler parameters are consistent with impaired relaxation. No evidence of left ventricular regional wall motion abnormalities. 2. The right ventricle has normal systolic function. The cavity was normal. There is no increase in right ventricular wall thickness. Right ventricular systolic pressure is normal with an estimated pressure of 21.0 mmHg. 3. The aortic valve is tricuspid. Moderate sclerosis of the aortic valve. 4. The aorta is normal unless otherwise noted.  EEG This study issuggestive of moderate diffuse encephalopathy which is nonspecific to etiology. No seizures or epileptiform discharges were seen throughout the recording.  Cultures: Blood cultures on 8/17 no growth MRSA nasal PCR on 8/18 neg Urine culture on 8/19 multiple contaminants Blood cultures from 8/22 neg Repeat MRSA PCR on 9/19 neg  Antimicrobials:  Ceftriaxone 8/19 - 8/23  Subjective:  Alert and sleeping in bed.  Denies complaints.  Objective: BP 117/88 (BP Location: Right Arm)   Pulse 69   Temp 97.8 F (36.6 C) (Oral)   Resp 18   Ht 5\' 8"  (1.727 m)   Wt 72.3 kg   SpO2 100%   BMI 24.24 kg/m   Intake/Output Summary (Last 24 hours) at 11/23/2019 1824 Last data filed at 11/23/2019 1700 Gross per 24 hour  Intake 1237 ml  Output 0 ml  Net 1237 ml   Filed Weights   11/21/19 0437 11/21/19 2036 11/22/19 2100  Weight: 71.7 kg 71.7 kg 72.3 kg    Exam: General exam: Pleasant young male, moderately built and nourished lying comfortably supine in bed without distress. Respiratory system: Clear to auscultation. Respiratory effort normal. Cardiovascular system: S1 & S2 heard, RRR. No JVD, murmurs, rubs, gallops or clicks. No pedal edema. Gastrointestinal system: Abdomen is nondistended, soft and nontender. No organomegaly or masses felt. Normal bowel sounds heard. Central nervous system: Alert and oriented x2.  No focal neurological  deficits. Extremities: Symmetric 5 x 5 power. Skin: No rashes, lesions or ulcers.  Psychiatry: Judgement and insight appear poor. Mood & affect not irritable today.  Data Reviewed: Basic Metabolic Panel: Recent Labs  Lab 11/20/19 2325  NA 140  K 3.9  CL 104  CO2 26  GLUCOSE 98  BUN 25*  CREATININE 1.28*  CALCIUM 9.5   Liver Function Tests: No results for input(s): AST, ALT, ALKPHOS, BILITOT, PROT, ALBUMIN in the last 168 hours. No results for input(s): LIPASE, AMYLASE in the last 168 hours. No results for input(s): AMMONIA in the last 168 hours. CBC: Recent Labs  Lab 11/20/19 2325  WBC 10.2  NEUTROABS 5.5  HGB 15.7  HCT 47.5  MCV 92.1  PLT 264     Studies: No results found.  Scheduled Meds: .  stroke: mapping our early stages of recovery book   Does not apply Once  . aspirin  325 mg Oral Daily  . atorvastatin  20 mg Oral q1800  . enoxaparin (LOVENOX) injection  40 mg Subcutaneous Q24H  . feeding supplement (ENSURE ENLIVE)  237 mL Oral BID BM  . folic acid  1 mg Oral Daily  . lactulose  20 g Oral BID  . metoprolol tartrate  25 mg Oral BID  . multivitamin with minerals  1 tablet Oral Daily  . omega-3 acid ethyl esters  1 g Oral Daily  . pantoprazole  40 mg Oral Daily  . thiamine  100 mg Oral Daily   Or  . thiamine  100 mg Intravenous Daily   Continuous Infusions:  Time spent: 15 minutes  Marcellus Scott, MD, [place], [facility]  To contact the attending provider between 7A-7P or the covering provider during after hours 7P-7A, please log into the web site [email or web address] and access using universal [place] password for that web site. If you do not have the password, please call the hospital operator.

## 2019-11-23 NOTE — TOC Progression Note (Signed)
Transition of Care [facility]) - Progression Note    Patient Details  Name: Tyler [name] MRN: [id number] Date of Birth: Jan 28, 1971  [facility]) CM/SW Contact  Sharlet Salina Mila Homer, LCSW Phone Number: 11/23/2019, 5:42 PM  Clinical Narrative:  CSW received a voicemail from [place] with [place]. Was advised that Sheralyn Boatman [phone]) will be doing an outreach to assess for services and can be contacted if any questions.  CSW will continue to follow and will continue to provide SW intervention services as needed.    Expected Discharge Plan: Skilled Nursing Facility Barriers to Discharge: Active Substance Use - Placement, SNF Pending payor source - LOG, SNF Pending bed offer  Expected Discharge Plan and Services Expected Discharge Plan: [place]   Discharge Planning Services: CM Consult, Other - See comment(Pt will need LOG for SNF placement) Post Acute Care Choice: [place] arrangements for the past 2 months: Single Family Home                 DME Arranged: N/A           HH Agency: NA         Social Determinants of Health (SDOH) Interventions    Readmission Risk Interventions No flowsheet data found.

## 2019-11-24 MED ORDER — METOPROLOL TARTRATE 50 MG PO TABS
50.0000 mg | ORAL_TABLET | Freq: Two times a day (BID) | ORAL | Status: DC
  Administered 2019-11-24 – 2019-12-16 (×20): 50 mg via ORAL
  Filled 2019-11-24 (×32): qty 1

## 2019-11-24 NOTE — Progress Notes (Signed)
PROGRESS NOTE  Tyler [name] YWV:[id number] DOB: [birth year] DOA: 07/23/2019 PCP: Patient, No Pcp Per  Brief Narrative: Tyler [name] is a 48 y.o. male with a history of polysubstance abuse who presented to [facility] ER after being found down with a syringe nearby. He was given Narcan with minimal improvement. On presentation, he was found to have severe rhabdomyolysis with acute kidney injury and hyperkalemia. He was started on IV fluids including bicarb drip and transferred to [facility] for further care. Rhabdomyolysis improved but he had persistence of encephalopathy and was found to have acute bilateral cerebellar infarct. Neurology was consulted and has subsequently signed off. PT recommended SNF placement. Prolonged hospitalization due to placement issues.   Patient is medically stable for discharge. Barriers to discharge currently are patient's cognitive status secondary to his issues as below as well as lack of ability to gain guardianship at this point as he does not have any family members willing to take care of him and patient is a difficult placement at SNF or ALF.  Case management attempting to reach DSS for guardianship issues.   Assessment/Plan: Principal Problem:   Cerebral embolism with cerebral infarction Active Problems:   Rhabdomyolysis   Acute kidney failure (HCC)   Acute metabolic encephalopathy   Polysubstance abuse (HCC)   Hyperkalemia   Transaminitis   HTN (hypertension)   Protein-calorie malnutrition, severe  Acute bilateral ischemic CVA, nonhemorrhagic: Neurology has signed off. Carotid duplex wnl, LDL 83, A1c 5.1%. No cardioembolic source on echocardiogram (LVEF 60 - 65%). Negative LE dopplers - Stable with new baseline cognitive deficits which are prohibiting his ability to live independently - Continue ASA, statin, risk factor control.  - Neurology recommended considering 30-day cardiac event monitoring as outpatient to rule out A. fib if patient  neurologically improves  Cognitive deficit secondary to above: Per psychiatry: Patient does not have mental capacity to make an informed decision and recommending someone assume guardianship for him. Unfortunately this is very difficult as so far no one has been willing to assume guardianship. - At present patient's daughter has a significant substance abuse problem and is unable to manage the care of the patient, patient's son is also unable to manage care.  At this juncture there is no one who can assist with 24/7 care of this patient. Currently awaiting SNF or ALF bed.  Discussed with TOC team on 12/16.  They will attempt to reach DSS regarding guardianship.  No new updates.  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 CT of the head and neck was initially negative for any traumatic finding. UDS, Tylenol and salicylate levels were negative. TSH, folate, B12 levels all within normal limits. EEG: Generalized diffuse slowing but no seizure activity -Had required intermittent restraints initially during the hospitalization, now without requirements. Continue monitoring.  Severe rhabdomyolysis/AKI: Resolved with IV fluids. Renal ultrasound negative.  - Remains off IV fluids with stable UOP. Monitor Scr intermittently. -Creatinine 1.28 yesterday.  Monitor periodically. -Repeat labs in a.m.  Transaminitis with elevated bilirubin: Probably from alcohol abuse. Right upper quadrant ultrasound showed hepatic steatosis with early cirrhosis with some gallbladder thickening - Resolved.   Alcohol abuse - Advised to quit.  - Continue thiamine, multivitamin and folic acid  UTI: Present on admission. Completed 5 days of Rocephin  Essential hypertension:  -Mildly uncontrolled, verified manually.  Increased metoprolol from 25 mg twice daily to 50 mg twice daily.  Severe  protein calorie malnutrition - Follow nutrition recommendations  Abdominal pain/back pain: Resolved. X-ray showed unchanged T11 compression fracture, age-indeterminate L1 fracture - Continue Voltaren gel as needed.  Generalized deconditioning: Likely secondary to alcoholism, CVA, prolonged hospitalization -PT, OT recommending SNF -Last PT evaluation was on 11/30: Patient's visual acuity had improved-he continued to require reorientation and max directional verbal cues to complete tasks.  He was able to find his room with minimal cueing which was improved over the previous week.  Patient continued to have lack of insight regarding his underlying deficits and safety was documented as unsafe to return to home alone.  This was supported by patient's recent psychiatric evaluation as well  DVT prophylaxis: Lovenox, he has been refusing Code Status: Full Family Communication: None available. Please see note dated 12/3 from previous attending physician. Initially contacted patient's aunt who then proceeded to give physician number of patient's daughter. Unfortunately this phone had been left at the residence of the patient's previous boyfriend to answer the phone and updated physician regarding patient's daughter. Apparently patient's daughter has a significant substance abuse problem and this is the rationale for why she unexpectedly left boyfriend who now has possession of her phone. Boyfriend Greig Castilla stated he would like to be helpful butwould be unable to take care of patient full-time. Social worker was updated by physician regarding above situation as well. Disposition Plan: Uncertain. CSW reports they will send out bed requests to ALF and SNFs case discussed with social worker today, no bed offer  Consultants:   Psychiatry  Neurology (signed off on 08/02/2019)  Procedures: Echocardiogram 1. The left ventricle has normal systolic function with an ejection fraction of 60-65%. The cavity  size was normal. Left ventricular diastolic Doppler parameters are consistent with impaired relaxation. No evidence of left ventricular regional wall motion abnormalities. 2. The right ventricle has normal systolic function. The cavity was normal. There is no increase in right ventricular wall thickness. Right ventricular systolic pressure is normal with an estimated pressure of 21.0 mmHg. 3. The aortic valve is tricuspid. Moderate sclerosis of the aortic valve. 4. The aorta is normal unless otherwise noted.  EEG This study issuggestive of moderate diffuse encephalopathy which is nonspecific to etiology. No seizures or epileptiform discharges were seen throughout the recording.  Cultures: Blood cultures on 8/17 no growth MRSA nasal PCR on 8/18 neg Urine culture on 8/19 multiple contaminants Blood cultures from 8/22 neg Repeat MRSA PCR on 9/19 neg  Antimicrobials:  Ceftriaxone 8/19 - 8/23  Subjective:  Patient seen sitting up in bed and then ambulating comfortably in the room.  Denies complaints.  Indicates that he always has high blood pressures.  No headache or visual symptoms.  Denies any other complaints.  As per RN, no acute issues noted.  Objective: BP (!) 152/96   Pulse 72   Temp 97.8 F (36.6 C) (Oral)   Resp 18   Ht 5\' 8"  (1.727 m)   Wt 72.3 kg   SpO2 98%   BMI 24.24 kg/m   Intake/Output Summary (Last 24 hours) at 11/24/2019 1457 Last data filed at 11/24/2019 1258 Gross per 24 hour  Intake 840 ml  Output 0 ml  Net 840 ml   Filed Weights   11/21/19 0437 11/21/19 2036 11/22/19 2100  Weight: 71.7 kg 71.7 kg 72.3 kg    Exam: General exam: Pleasant young male, moderately built and nourished seen ambulating comfortably in the room without distress. Respiratory system: Clear to auscultation.  No increased work of breathing.  Cardiovascular system: S1 & S2 heard, RRR. No JVD, murmurs, rubs, gallops or clicks. No pedal edema. Gastrointestinal system: Abdomen is  nondistended, soft and nontender. No organomegaly or masses felt. Normal bowel sounds heard. Central nervous system: Alert and oriented x2. No focal neurological deficits. Extremities: Symmetric 5 x 5 power. Skin: No rashes, lesions or ulcers.  Psychiatry: Judgement and insight appear poor. Mood & affect not irritable today.  Data Reviewed: Basic Metabolic Panel: Recent Labs  Lab 11/20/19 2325  NA 140  K 3.9  CL 104  CO2 26  GLUCOSE 98  BUN 25*  CREATININE 1.28*  CALCIUM 9.5   Liver Function Tests: No results for input(s): AST, ALT, ALKPHOS, BILITOT, PROT, ALBUMIN in the last 168 hours. No results for input(s): LIPASE, AMYLASE in the last 168 hours. No results for input(s): AMMONIA in the last 168 hours. CBC: Recent Labs  Lab 11/20/19 2325  WBC 10.2  NEUTROABS 5.5  HGB 15.7  HCT 47.5  MCV 92.1  PLT 264     Studies: No results found.  Scheduled Meds: .  stroke: mapping our early stages of recovery book   Does not apply Once  . aspirin  325 mg Oral Daily  . atorvastatin  20 mg Oral q1800  . enoxaparin (LOVENOX) injection  40 mg Subcutaneous Q24H  . feeding supplement (ENSURE ENLIVE)  237 mL Oral BID BM  . folic acid  1 mg Oral Daily  . lactulose  20 g Oral BID  . metoprolol tartrate  25 mg Oral BID  . multivitamin with minerals  1 tablet Oral Daily  . omega-3 acid ethyl esters  1 g Oral Daily  . pantoprazole  40 mg Oral Daily  . thiamine  100 mg Oral Daily   Or  . thiamine  100 mg Intravenous Daily   Continuous Infusions:  Time spent: 15 minutes  Marcellus ScottAnand Cherryl Babin, MD, [place]ACP, [facility]. [facility] HospitalFHM. [facility]  To contact the attending provider between 7A-7P or the covering provider during after hours 7P-7A, please log into the web site [email or web address] and access using universal [place] password for that web site. If you do not have the password, please call the hospital operator.

## 2019-11-25 LAB — COMPREHENSIVE METABOLIC PANEL
ALT: 54 U/L — ABNORMAL HIGH (ref 0–44)
AST: 40 U/L (ref 15–41)
Albumin: 3.9 g/dL (ref 3.5–5.0)
Alkaline Phosphatase: 58 U/L (ref 38–126)
Anion gap: 10 (ref 5–15)
BUN: 17 mg/dL (ref 6–20)
CO2: 26 mmol/L (ref 22–32)
Calcium: 9.6 mg/dL (ref 8.9–10.3)
Chloride: 105 mmol/L (ref 98–111)
Creatinine, Ser: 1.31 mg/dL — ABNORMAL HIGH (ref 0.61–1.24)
GFR calc Af Amer: >60 mL/min (ref >60)
GFR calc non Af Amer: >60 mL/min (ref >60)
Glucose, Bld: 94 mg/dL (ref 70–99)
Potassium: 4.5 mmol/L (ref 3.5–5.1)
Sodium: 141 mmol/L (ref 135–145)
Total Bilirubin: 0.7 mg/dL (ref 0.3–1.2)
Total Protein: 7.7 g/dL (ref 6.5–8.1)

## 2019-11-25 NOTE — Progress Notes (Signed)
PROGRESS NOTE  Tyler AppleSteven Obrecht WJX:[id number] DOB: 06/30/1971 DOA: 07/23/2019 PCP: Patient, No Pcp Per  Brief Narrative: Tyler [name] is a 48 y.o. male with a history of polysubstance abuse who [facility] after being found down with a syringe nearby. He was given Narcan with minimal improvement. On presentation, he was found to have severe rhabdomyolysis with acute kidney injury and hyperkalemia. He was started on IV fluids including bicarb drip and [facility]. Rhabdomyolysis improved but he had persistence of encephalopathy and was found to have acute bilateral cerebellar infarct. Neurology was consulted and has subsequently signed off. PT recommended SNF placement. Prolonged hospitalization due to placement issues.   Patient is medically stable for discharge. Barriers to discharge currently are patient's cognitive status secondary to his issues as below as well as lack of ability to gain guardianship at this point as he does not have any family members willing to take care of him and patient is a difficult placement at SNF or ALF.  Case management attempting to reach DSS for guardianship issues.   Assessment/Plan: Principal Problem:   Cerebral embolism with cerebral infarction Active Problems:   Rhabdomyolysis   Acute kidney failure (HCC)   Acute metabolic encephalopathy   Polysubstance abuse (HCC)   Hyperkalemia   Transaminitis   HTN (hypertension)   Protein-calorie malnutrition, severe  Acute bilateral ischemic CVA, nonhemorrhagic: Neurology has signed off. Carotid duplex wnl, LDL 83, A1c 5.1%. No cardioembolic source on echocardiogram (LVEF 60 - 65%). Negative LE dopplers - Stable with new baseline cognitive deficits which are prohibiting his ability to live independently - Continue ASA, statin, risk factor control.  - Neurology recommended considering 30-day cardiac event monitoring as outpatient to rule out A. fib if patient  neurologically improves  Cognitive deficit secondary to above: Per psychiatry: Patient does not have mental capacity to make an informed decision and recommending someone assume guardianship for him. Unfortunately this is very difficult as so far no one has been willing to assume guardianship. - At present patient's daughter has a significant substance abuse problem and is unable to manage the care of the patient, patient's son is also unable to manage care.  At this juncture there is no one who can assist with 24/7 care of this patient. Currently awaiting SNF or ALF bed.  Discussed with TOC team on 12/16.  They will attempt to reach DSS regarding guardianship.  No new updates.  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 CT of the head and neck was initially negative for any traumatic finding. UDS, Tylenol and salicylate levels were negative. TSH, folate, B12 levels all within normal limits. EEG: Generalized diffuse slowing but no seizure activity -Had required intermittent restraints initially during the hospitalization, now without requirements. Continue monitoring.  Severe rhabdomyolysis/AKI: Resolved with IV fluids. Renal ultrasound negative.  - Remains off IV fluids with stable UOP. Monitor Scr intermittently. -Creatinine 1.28> 1.31, slightly up. Encourage liberal PO fluids. Follow BMP in 48-72 hours.  Transaminitis with elevated bilirubin: Probably from alcohol abuse. Right upper quadrant ultrasound showed hepatic steatosis with early cirrhosis with some gallbladder thickening - Resolved.   Alcohol abuse - Advised to quit.  - Continue thiamine, multivitamin and folic acid  UTI: Present on admission. Completed 5 days of Rocephin  Essential hypertension:  -Mildly uncontrolled, verified manually.  Increased metoprolol from 25 mg twice daily to 50 mg twice  daily.  Severe protein calorie malnutrition - Follow nutrition recommendations  Abdominal pain/back pain: Resolved. X-ray showed unchanged T11 compression fracture, age-indeterminate L1 fracture - Continue Voltaren gel as needed.  Generalized deconditioning: Likely secondary to alcoholism, CVA, prolonged hospitalization -PT, OT recommending SNF -Last PT evaluation was on 11/30: Patient's visual acuity had improved-he continued to require reorientation and max directional verbal cues to complete tasks.  He was able to find his room with minimal cueing which was improved over the previous week.  Patient continued to have lack of insight regarding his underlying deficits and safety was documented as unsafe to return to home alone.  This was supported by patient's recent psychiatric evaluation as well  DVT prophylaxis: Lovenox, he has been refusing Code Status: Full Family Communication: None available. Please see note dated 12/3 from previous attending physician. Initially contacted patient's aunt who then proceeded to give physician number of patient's daughter. Unfortunately this phone had been left at the residence of the patient's previous boyfriend to answer the phone and updated physician regarding patient's daughter. Apparently patient's daughter has a significant substance abuse problem and this is the rationale for why she unexpectedly left boyfriend who now has possession of her phone. Boyfriend [name] stated he would like to be helpful butwould be unable to take care of patient full-time. Social worker was updated by physician regarding above situation as well. Disposition Plan: Uncertain. CSW reports they will send out bed requests to ALF and SNFs case discussed with social worker today, no bed offer  Consultants:   Psychiatry  Neurology (signed off on 08/02/2019)  Procedures: Echocardiogram 1. The left ventricle has normal systolic function with an ejection fraction of  60-65%. The cavity size was normal. Left ventricular diastolic Doppler parameters are consistent with impaired relaxation. No evidence of left ventricular regional wall motion abnormalities. 2. The right ventricle has normal systolic function. The cavity was normal. There is no increase in right ventricular wall thickness. Right ventricular systolic pressure is normal with an estimated pressure of 21.0 mmHg. 3. The aortic valve is tricuspid. Moderate sclerosis of the aortic valve. 4. The aorta is normal unless otherwise noted.  EEG This study issuggestive of moderate diffuse encephalopathy which is nonspecific to etiology. No seizures or epileptiform discharges were seen throughout the recording.  Cultures: Blood cultures on 8/17 no growth MRSA nasal PCR on 8/18 neg Urine culture on 8/19 multiple contaminants Blood cultures from 8/22 neg Repeat MRSA PCR on 9/19 neg  Antimicrobials:  Ceftriaxone 8/19 - 8/23  Subjective:  Denies complaints. No acute issues per RN report.  Objective: BP (!) 146/86 (BP Location: Right Arm)   Pulse 62   Temp 97.9 F (36.6 C) (Oral)   Resp 18   Ht 5\' 8"  (1.727 m)   Wt 72.3 kg   SpO2 99%   BMI 24.24 kg/m   Intake/Output Summary (Last 24 hours) at 11/25/2019 1518 Last data filed at 11/25/2019 2992 Gross per 24 hour  Intake 1080 ml  Output 0 ml  Net 1080 ml   Filed Weights   11/21/19 0437 11/21/19 2036 11/22/19 2100  Weight: 71.7 kg 71.7 kg 72.3 kg    Exam:  General exam: Pleasant young male, moderately built and nourished sleeping in bed comfortably. Respiratory system: Clear to auscultation.  No increased work of breathing. Cardiovascular system: S1 & S2 heard, RRR. No JVD, murmurs, rubs, gallops or clicks. No pedal edema. Gastrointestinal system: Abdomen is nondistended, soft and nontender. No organomegaly or masses felt. Normal bowel  sounds heard. Central nervous system: Alert and oriented x2. No focal neurological  deficits. Extremities: Symmetric 5 x 5 power. Skin: No rashes, lesions or ulcers.  Psychiatry: Judgement and insight appear poor. Mood & affect not irritable today.  Data Reviewed: Basic Metabolic Panel: Recent Labs  Lab 11/20/19 2325 11/25/19 0627  NA 140 141  K 3.9 4.5  CL 104 105  CO2 26 26  GLUCOSE 98 94  BUN 25* 17  CREATININE 1.28* 1.31*  CALCIUM 9.5 9.6   Liver Function Tests: Recent Labs  Lab 11/25/19 0627  AST 40  ALT 54*  ALKPHOS 58  BILITOT 0.7  PROT 7.7  ALBUMIN 3.9   No results for input(s): LIPASE, AMYLASE in the last 168 hours. No results for input(s): AMMONIA in the last 168 hours. CBC: Recent Labs  Lab 11/20/19 2325  WBC 10.2  NEUTROABS 5.5  HGB 15.7  HCT 47.5  MCV 92.1  PLT 264     Studies: No results found.  Scheduled Meds: . aspirin  325 mg Oral Daily  . atorvastatin  20 mg Oral q1800  . enoxaparin (LOVENOX) injection  40 mg Subcutaneous Q24H  . feeding supplement (ENSURE ENLIVE)  237 mL Oral BID BM  . folic acid  1 mg Oral Daily  . lactulose  20 g Oral BID  . metoprolol tartrate  50 mg Oral BID  . multivitamin with minerals  1 tablet Oral Daily  . omega-3 acid ethyl esters  1 g Oral Daily  . pantoprazole  40 mg Oral Daily  . thiamine  100 mg Oral Daily   Or  . thiamine  100 mg Intravenous Daily   Continuous Infusions:  Time spent: 15 minutes  Marcellus Scott, MD, [place], [facility]. [facility]  To contact the attending provider between 7A-7P or the covering provider during after hours 7P-7A, please log into the web site [email or web address] and access using universal [place] password for that web site. If you do not have the password, please call the hospital operator.

## 2019-11-26 NOTE — Progress Notes (Signed)
Occupational Therapy Treatment Patient Details Name: Tyler [name] MRN: [id number] DOB: June 05, 1971 Today's Date: 11/26/2019    History of present illness 48 y/o male transferred from [place] for unresponsiveness, likely secondary to substance abuse. Pt with rhabdomyolysis and AKI. EEG revealed moderate diffuse encephalopathy. PMH includes polysubstance abuse and HTN. Imaging show acute bialteral cerebellar infarcts.    OT comments  Goals updated this session. Pt demonstrates independence with functional mobility and physical independence with ADL completion. However, due to cognitive limitations pt requires max multimodal cues with use of visual checklist to complete 3 grooming activities at sink level. Due to pt's cognitive limitations, he would greatly benefit from a structured daily routine with compensatory strategies for cognitive limitations to maximize independence with ADL. Will continue to address cognitive compensatory and adaptive strategies with pt to maximize his independence with ADL/IADL completion. Will continue to follow acutely and progress as tolerated.    Follow Up Recommendations  SNF;Supervision/Assistance - 24 hour(ALF)    Equipment Recommendations  None recommended by OT    Recommendations for Other Services      Precautions / Restrictions Precautions Precautions: None Restrictions Weight Bearing Restrictions: No       Mobility Bed Mobility Overal bed mobility: Independent                Transfers Overall transfer level: Independent                    Balance Overall balance assessment: Independent                                         ADL either performed or assessed with clinical judgement   ADL Overall ADL's : Needs assistance/impaired   Eating/Feeding Details (indicate cue type and reason): use of written instructions for grooming task completion at sink level;pt reuqired maxA to attend to and follow  written instructions Grooming: Maximal assistance;Cueing for compensatory techniques;Cueing for sequencing Grooming Details (indicate cue type and reason): use of written instructions for grooming task completion at sink level;pt reuqired maxA to attend to and follow written instructions                             Functional mobility during ADLs: Independent General ADL Comments: pt primarily independent with physical aspects with completion of ADL. However, due to cognitive limitations requires max multimodal cues for task initiation, sequencing, and completion     Vision       Perception     Praxis      Cognition Arousal/Alertness: Awake/alert Behavior During Therapy: WFL for tasks assessed/performed Overall Cognitive Status: No family/caregiver present to determine baseline cognitive functioning Area of Impairment: Attention;Memory;Following commands;Safety/judgement;Awareness;Problem solving                   Current Attention Level: Sustained Memory: Decreased short-term memory Following Commands: Follows multi-step commands with increased time;Follows multi-step commands inconsistently;Follows one step commands consistently Safety/Judgement: Decreased awareness of deficits Awareness: Emergent Problem Solving: Decreased initiation;Difficulty sequencing;Requires verbal cues;Slow processing;Requires tactile cues General Comments: provided pt with written 3 step instructions for grooming activities completed at sink level;he required max cues to redirect and use written steps for completion;pt demonstrated difficulty with reading, required 3 attempts to read instructions correctly;pt demonstrated increased dificulty with trailmaking;he was able to complete trail making with numbers with min cues;when given  trail making (1A2B3C) pt was unable to correctly complete despite therapist demonstration 3x;pt unable to initiate independently;unsure pt's baseline  cognition;pt would benefit from structured environment        Exercises     Shoulder Instructions       General Comments pt would significantly benefit from structure routine     Pertinent Vitals/ Pain       Pain Assessment: No/denies pain  Home Living                                          Prior Functioning/Environment              Frequency  Min 1X/week        Progress Toward Goals  OT Goals(current goals can now be found in the care plan section)  Progress towards OT goals: Progressing toward goals;Goals met and updated - see care plan  Acute Rehab OT Goals Patient Stated Goal: pt didn't state OT Goal Formulation: With patient Time For Goal Achievement: 12/10/19 Potential to Achieve Goals: Good ADL Goals Additional ADL Goal #1: Pt will brush his teeth daily with min cues to establish a daily routine. Additional ADL Goal #3: Pt will complete 3 grooming tasks with min cues.  Plan Discharge plan remains appropriate    Co-evaluation                 AM-PAC OT "6 Clicks" Daily Activity     Outcome Measure   Help from another person eating meals?: None Help from another person taking care of personal grooming?: A Lot Help from another person toileting, which includes using toliet, bedpan, or urinal?: None Help from another person bathing (including washing, rinsing, drying)?: A Little Help from another person to put on and taking off regular upper body clothing?: None Help from another person to put on and taking off regular lower body clothing?: None 6 Click Score: 21    End of Session    OT Visit Diagnosis: Other symptoms and signs involving cognitive function   Activity Tolerance Patient tolerated treatment well   Patient Left in bed;with call bell/phone within reach   Nurse Communication Mobility status        Time: 5681-2751 OT Time Calculation (min): 12 min  Charges: OT General Charges $OT Visit: 1 Visit OT  Treatments $Self Care/Home Management : 8-22 mins  Dorinda Hill OTR/L Acute Rehabilitation Services Office: [place] 11/26/2019, 11:52 AM

## 2019-11-26 NOTE — Progress Notes (Signed)
PROGRESS NOTE  Tyler [name] IZT:[id number] DOB: [birth year] DOA: 07/23/2019 PCP: Patient, No Pcp Per  Brief Narrative: Tyler [name] is a 48 y.o. male with a history of polysubstance abuse who presented to [facility] ER after being found down with a syringe nearby. He was given Narcan with minimal improvement. On presentation, he was found to have severe rhabdomyolysis with acute kidney injury and hyperkalemia. He was started on IV fluids including bicarb drip and transferred to [facility] for further care. Rhabdomyolysis improved but he had persistence of encephalopathy and was found to have acute bilateral cerebellar infarct. Neurology was consulted and has subsequently signed off. PT recommended SNF placement. Prolonged hospitalization due to placement issues.   Patient is medically stable for discharge. Barriers to discharge currently are patient's cognitive status secondary to his issues as below as well as lack of ability to gain guardianship at this point as he does not have any family members willing to take care of him and patient is a difficult placement at SNF or ALF.  Case management attempting to reach DSS for guardianship issues.  Between 11/21/2019-11/26/2019, patient has remained medically stable without acute issues.   Assessment/Plan: Principal Problem:   Cerebral embolism with cerebral infarction Active Problems:   Rhabdomyolysis   Acute kidney failure (HCC)   Acute metabolic encephalopathy   Polysubstance abuse (HCC)   Hyperkalemia   Transaminitis   HTN (hypertension)   Protein-calorie malnutrition, severe  Acute bilateral ischemic CVA, nonhemorrhagic: Neurology has signed off. Carotid duplex wnl, LDL 83, A1c 5.1%. No cardioembolic source on echocardiogram (LVEF 60 - 65%). Negative LE dopplers - Stable with new baseline cognitive deficits which are prohibiting his ability to live independently - Continue ASA, statin, risk factor control.  - Neurology  recommended considering 30-day cardiac event monitoring as outpatient to rule out A. fib if patient neurologically improves  Cognitive deficit secondary to above: Per psychiatry: Patient does not have mental capacity to make an informed decision and recommending someone assume guardianship for him. Unfortunately this is very difficult as so far no one has been willing to assume guardianship. - At present patient's daughter has a significant substance abuse problem and is unable to manage the care of the patient, patient's son is also unable to manage care.  At this juncture there is no one who can assist with 24/7 care of this patient. Currently awaiting SNF or ALF bed.  Discussed with TOC team on 12/16.  They will attempt to reach DSS regarding guardianship.  No new updates.  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 CT of the head and neck was initially negative for any traumatic finding. UDS, Tylenol and salicylate levels were negative. TSH, folate, B12 levels all within normal limits. EEG: Generalized diffuse slowing but no seizure activity -Had required intermittent restraints initially during the hospitalization, now without requirements. Continue monitoring.  Severe rhabdomyolysis/AKI: Resolved with IV fluids. Renal ultrasound negative.  - Remains off IV fluids with stable UOP. Monitor Scr intermittently. -Creatinine 1.28> 1.31, slightly up. Encourage liberal PO fluids. Follow BMP in 48-72 hours.  Transaminitis with elevated bilirubin: Probably from alcohol abuse. Right upper quadrant ultrasound showed hepatic steatosis with early cirrhosis with some gallbladder thickening - Resolved.   Alcohol abuse - Advised to quit.  - Continue thiamine, multivitamin and folic acid  UTI: Present on admission. Completed 5 days of Rocephin  Essential hypertension:    -Mildly uncontrolled, verified  manually.  Increased metoprolol from 25 mg twice daily to 50 mg twice daily.  Severe protein calorie malnutrition - Follow nutrition recommendations  Abdominal pain/back pain: Resolved. X-ray showed unchanged T11 compression fracture, age-indeterminate L1 fracture - Continue Voltaren gel as needed.  Generalized deconditioning: Likely secondary to alcoholism, CVA, prolonged hospitalization -PT, OT recommending SNF -Last PT evaluation was on 11/30: Patient's visual acuity had improved-he continued to require reorientation and max directional verbal cues to complete tasks.  He was able to find his room with minimal cueing which was improved over the previous week.  Patient continued to have lack of insight regarding his underlying deficits and safety was documented as unsafe to return to home alone.  This was supported by patient's recent psychiatric evaluation as well  DVT prophylaxis: Lovenox, he has been refusing Code Status: Full Family Communication: None available. Please see note dated 12/3 from previous attending physician. Initially contacted patient's aunt who then proceeded to give physician number of patient's daughter. Unfortunately this phone had been left at the residence of the patient's previous boyfriend to answer the phone and updated physician regarding patient's daughter. Apparently patient's daughter has a significant substance abuse problem and this is the rationale for why she unexpectedly left boyfriend who now has possession of her phone. Boyfriend Greig Castillandrew stated he would like to be helpful butwould be unable to take care of patient full-time. Social worker was updated by physician regarding above situation as well. Disposition Plan: Uncertain. CSW reports they will send out bed requests to ALF and SNFs case discussed with social worker today, no bed offer  Consultants:   Psychiatry  Neurology (signed off on  08/02/2019)  Procedures: Echocardiogram 1. The left ventricle has normal systolic function with an ejection fraction of 60-65%. The cavity size was normal. Left ventricular diastolic Doppler parameters are consistent with impaired relaxation. No evidence of left ventricular regional wall motion abnormalities. 2. The right ventricle has normal systolic function. The cavity was normal. There is no increase in right ventricular wall thickness. Right ventricular systolic pressure is normal with an estimated pressure of 21.0 mmHg. 3. The aortic valve is tricuspid. Moderate sclerosis of the aortic valve. 4. The aorta is normal unless otherwise noted.  EEG This study issuggestive of moderate diffuse encephalopathy which is nonspecific to etiology. No seizures or epileptiform discharges were seen throughout the recording.  Cultures: Blood cultures on 8/17 no growth MRSA nasal PCR on 8/18 neg Urine culture on 8/19 multiple contaminants Blood cultures from 8/22 neg Repeat MRSA PCR on 9/19 neg  Antimicrobials:  Ceftriaxone 8/19 - 8/23  Subjective:  Denies complaints.  Has had a clean shave.  Objective: BP 106/74 (BP Location: Left Arm)   Pulse 70   Temp 98 F (36.7 C) (Oral)   Resp 18   Ht 5\' 8"  (1.727 m)   Wt 72.5 kg   SpO2 100%   BMI 24.30 kg/m   Intake/Output Summary (Last 24 hours) at 11/26/2019 [id number] Last data filed at 11/26/2019 1723 Gross per 24 hour  Intake 1320 ml  Output 0 ml  Net 1320 ml   Filed Weights   11/21/19 2036 11/22/19 2100 11/25/19 2131  Weight: 71.7 kg 72.3 kg 72.5 kg    Exam:  General exam: Pleasant young male, moderately built and nourished sleeping in bed comfortably. Respiratory system: Clear to auscultation.  No increased work of breathing. Cardiovascular system: S1 & S2 heard, RRR. No JVD, murmurs, rubs, gallops or clicks. No pedal edema. Gastrointestinal system: Abdomen  is nondistended, soft and nontender. No organomegaly or masses felt.  Normal bowel sounds heard. Central nervous system: Alert and oriented x2. No focal neurological deficits. Extremities: Symmetric 5 x 5 power. Skin: No rashes, lesions or ulcers.  Psychiatry: Judgement and insight appear poor. Mood & affect, pleasant and smiling.  Data Reviewed: Basic Metabolic Panel: Recent Labs  Lab 11/20/19 2325 11/25/19 0627  NA 140 141  K 3.9 4.5  CL 104 105  CO2 26 26  GLUCOSE 98 94  BUN 25* 17  CREATININE 1.28* 1.31*  CALCIUM 9.5 9.6   Liver Function Tests: Recent Labs  Lab 11/25/19 0627  AST 40  ALT 54*  ALKPHOS 58  BILITOT 0.7  PROT 7.7  ALBUMIN 3.9   No results for input(s): LIPASE, AMYLASE in the last 168 hours. No results for input(s): AMMONIA in the last 168 hours. CBC: Recent Labs  Lab 11/20/19 2325  WBC 10.2  NEUTROABS 5.5  HGB 15.7  HCT 47.5  MCV 92.1  PLT 264     Studies: No results found.  Scheduled Meds: . aspirin  325 mg Oral Daily  . atorvastatin  20 mg Oral q1800  . enoxaparin (LOVENOX) injection  40 mg Subcutaneous Q24H  . feeding supplement (ENSURE ENLIVE)  237 mL Oral BID BM  . folic acid  1 mg Oral Daily  . lactulose  20 g Oral BID  . metoprolol tartrate  50 mg Oral BID  . multivitamin with minerals  1 tablet Oral Daily  . omega-3 acid ethyl esters  1 g Oral Daily  . pantoprazole  40 mg Oral Daily  . thiamine  100 mg Oral Daily   Or  . thiamine  100 mg Intravenous Daily   Continuous Infusions:  Time spent: 15 minutes  Marcellus Scott, MD, [place], [facility]. [facility]  To contact the attending provider between 7A-7P or the covering provider during after hours 7P-7A, please log into the web site [email or web address] and access using universal [place] password for that web site. If you do not have the password, please call the hospital operator.

## 2019-11-26 NOTE — TOC Progression Note (Signed)
Transition of Care [facility]) - Progression Note    Patient Details  Name: Tyler [name] MRN: [id number] Date of Birth: Apr 10, 1971  [facility] CM/SW Contact  Sharlet Salina Mila Homer, LCSW Phone Number: 11/26/2019, 5:36 PM  Clinical Narrative: CSW received a call from Sheralyn Boatman, Adult Protective Services SW with [facility] DSS. Per Ms. Nicole Kindred, patient has been accepted as an outreach for services, not as an abuse/neglect report. CSW also advised that they are unable to accept any new guardianship clients at this time, so this possibility with Mr. Breck cannot be looked into. Placement for patient discussed and Ms. Nicole Kindred advised that she will see what family care homes in [facility] are available. Call received (9:57 am) from Ms. Nicole Kindred and [facility] in [place] provided. The contact person and phone number is Hassan Rowan, [phone].  Call made to [facility] with [facility] and she requested that patient's clinicals be faxed to her - Fax [phone]. Clinicals faxed.  CSW will continue to look into placement for patient.    Expected Discharge Plan: Skilled Nursing Facility Barriers to Discharge: Active Substance Use - Placement, SNF Pending payor source - LOG, SNF Pending bed offer  Expected Discharge Plan and Services Expected Discharge Plan: [place]   Discharge Planning Services: CM Consult, Other - See comment(Pt will need LOG for SNF placement) Post Acute Care Choice: [place] arrangements for the past 2 months: Single Family Home                 DME Arranged: N/A           HH Agency: NA         Social Determinants of Health (SDOH) Interventions  Patient has not family that can assist in looking after him or become his guardian.  Readmission Risk Interventions No flowsheet data found.

## 2019-11-26 NOTE — Progress Notes (Signed)
Nutrition Follow-up  RD working remotely.  DOCUMENTATION CODES:   Severe malnutrition in context of acute illness/injury  INTERVENTION:   -Continue with dysphagia 3 diet with thin liquids for ease of intake -Continue Ensure Enlive po BID, each supplement provides 350 kcal and 20 grams of protein -Continue Magic cup TID with meals, each supplement provides 290 kcal and 9 grams of protein -Continue MVI with minerals daily  NUTRITION DIAGNOSIS:   Severe Malnutrition related to acute illness(acute toxic/metabolic encephalopathy) as evidenced by moderate fat depletion, severe fat depletion, moderate muscle depletion, severe muscle depletion, energy intake < or equal to 50% for > or equal to 5 days, percent weight loss.  Ongoing  GOAL:   Patient will meet greater than or equal to 90% of their needs  Progressing   MONITOR:   PO intake, Supplement acceptance, Labs, Weight trends, Skin, I & O's  REASON FOR ASSESSMENT:   LOS    ASSESSMENT:   Tyler [name] is a 48 y.o. male with medical history significant of polysubstance abuse including heroin.  Brought in to [facility] ED just before MN after being found down by friend last evening with nearby syringe.  Reviewed I/O's: +1.9 L x 24 hours and +11.4 L since 11/12/19  Attempted to speak with pt via phone, however, no answer. Pt is familiar to RD team due to prolonged hospitalization (125 days). Pt continues to have a good appetite; noted meal completion 100% documented for last 6 meals.   Per MAR, pt is consuming Ensure supplements and is amenable to continue supplements.   Admission wt: 73 kg; Current wt (11/24/20): 72.5 kg  Medications reviewed and include lactulose, omega-3 acid ethyl esters, ad vitamin B-1.   Pt is medically stable for discharge, however, pt with several placement barriers. CSW continues to search for ALF/Family Care Home placement and also working in conjunction with [place] for guardianship.   Labs  reviewed.   Diet Order:   Diet Order            DIET DYS 3 Room service appropriate? No; Fluid consistency: Thin  Diet effective now              EDUCATION NEEDS:   Not appropriate for education at this time  Skin:  Skin Assessment: Reviewed RN Assessment  Last BM:  11/20/19  Height:   Ht Readings from Last 1 Encounters:  08/09/19 5\' 8"  (1.727 m)    Weight:   Wt Readings from Last 1 Encounters:  11/25/19 72.5 kg    Ideal Body Weight:  70 kg  BMI:  Body mass index is 24.3 kg/m.  Estimated Nutritional Needs:   Kcal:  2000-2200  Protein:  105-120 grams  Fluid:  > 2 L    Tyler [name] A. Jimmye Norman, RD, LDN, [place] Registered Dietitian II Certified Diabetes Care and Education Specialist Pager: [phone] After hours Pager: [phone]

## 2019-11-27 NOTE — Progress Notes (Signed)
PROGRESS NOTE    Alize Acy   IOE:[id number]  DOB: August 02, 1971  DOA: 07/23/2019 PCP: Patient, No Pcp Per   Brief Narrative:  [name] is a48 y.o.malewith a history of polysubstance abuse who presented to [facility] ER after being found down with a syringe nearby. He was given Narcan with minimal improvement. On presentation, he was found to have severe rhabdomyolysis with acute kidney injury and hyperkalemia. He was started on IV fluids including bicarb drip and transferred to [facility] for further care. Rhabdomyolysis improved but he had persistence of encephalopathy and was found to have acute bilateral cerebellar infarct. Neurology was consulted and has subsequently signed off. PT recommended SNF placement. Prolonged hospitalization due to placement issues.   Patient is medically stable for discharge. Barriers to discharge currently are patient's cognitive status secondary to his issues as below as well as lack of ability to gain guardianship at this point as he does not have any family members willing to take care of him and patient is a difficult placement at SNF or ALF.  Case management attempting to reach DSS for guardianship issues.  Between 11/21/2019-11/26/2019, patient has remained medically stable without acute issues.   Subjective: No complaints.     Assessment & Plan:   Principal Problem:   Acute b/l ischemic infarcts - has new cognitie deficits and is no longer able to live alone' - needs 30 day event monitor as outpt when discharged, per neuro - also per neuro, cont ASA and Statin - Carotid duplex unremarkable - ECHO showed not thrombus and EF was 60-65% - A1c 5.1%  Active Problems:   Rhabdomyolysis with AKI - has resolved - renal ultrasound was negative - following Cr    Acute metabolic encephalopathy - no suspected to be chronic encephalopathy -initially required restrains but has improved    Polysubstance abuse  - alcohol and drug  use   Essential HTN - cont Metoprolol  Severe protein calorie malnutrition - cont dietary supplements  Back pain - he has T11 and L1 fractures noted on xray - pain resolved  UTI  treated with Ceftriaxone  Severe generalized weakness - PT/OT recommends SNF   Time spent in minutes: 35  DVT prophylaxis: Lovenox Code Status: Full code Family Communication:  Disposition Plan: ALF vs SNF Consultants:   Neurology  psych Procedures:  Echocardiogram 1. The left ventricle has normal systolic function with an ejection fraction of 60-65%. The cavity size was normal. Left ventricular diastolic Doppler parameters are consistent with impaired relaxation. No evidence of left ventricular regional wall motion abnormalities. 2. The right ventricle has normal systolic function. The cavity was normal. There is no increase in right ventricular wall thickness. Right ventricular systolic pressure is normal with an estimated pressure of 21.0 mmHg. 3. The aortic valve is tricuspid. Moderate sclerosis of the aortic valve. 4. The aorta is normal unless otherwise noted.  EEG This study issuggestive of moderate diffuse encephalopathy which is nonspecific to etiology. No seizures or epileptiform discharges were seen throughout the recording.  Antimicrobials:  Anti-infectives (From admission, onward)   Start     Dose/Rate Route Frequency Ordered Stop   07/25/19 0745  cefTRIAXone (ROCEPHIN) 1 g in sodium chloride 0.9 % 100 mL IVPB     1 g 200 mL/hr over 30 Minutes Intravenous Every 24 hours 07/25/19 0734 07/29/19 1518       Objective: Vitals:   11/26/19 0533 11/26/19 0855 11/26/19 2132 11/27/19 0934  BP: 110/90 106/74 102/69 111/72  Pulse: 61 70 (!)  57 (!) 56  Resp: 18 18 18 18   Temp: 97.7 F (36.5 C) 98 F (36.7 C) 98.2 F (36.8 C) 97.8 F (36.6 C)  TempSrc: Oral Oral Oral Oral  SpO2: 96% 100% 96% 99%  Weight:   72.5 kg   Height:        Intake/Output Summary (Last 24 hours) at  11/27/2019 1354 Last data filed at 11/27/2019 1201 Gross per 24 hour  Intake 1617 ml  Output 0 ml  Net 1617 ml   Filed Weights   11/22/19 2100 11/25/19 2131 11/26/19 2132  Weight: 72.3 kg 72.5 kg 72.5 kg    Examination: General exam: Appears comfortable  HEENT: PERRLA, oral mucosa moist, no sclera icterus or thrush Respiratory system: Clear to auscultation. Respiratory effort normal. Cardiovascular system: S1 & S2 heard, RRR.   Gastrointestinal system: Abdomen soft, non-tender, nondistended. Normal bowel sounds. Central nervous system: Alert and oriented x 2. No focal neurological deficits. Extremities: No cyanosis, clubbing or edema Skin: No rashes or ulcers Psychiatry:  Mood & affect appropriate.     Data Reviewed: I have personally reviewed following labs and imaging studies  CBC: Recent Labs  Lab 11/20/19 2325  WBC 10.2  NEUTROABS 5.5  HGB 15.7  HCT 47.5  MCV 92.1  PLT 264   Basic Metabolic Panel: Recent Labs  Lab 11/20/19 2325 11/25/19 0627  NA 140 141  K 3.9 4.5  CL 104 105  CO2 26 26  GLUCOSE 98 94  BUN 25* 17  CREATININE 1.28* 1.31*  CALCIUM 9.5 9.6   GFR: Estimated Creatinine Clearance: 65.3 mL/min (A) (by C-G formula based on SCr of 1.31 mg/dL (H)). Liver Function Tests: Recent Labs  Lab 11/25/19 0627  AST 40  ALT 54*  ALKPHOS 58  BILITOT 0.7  PROT 7.7  ALBUMIN 3.9   No results for input(s): LIPASE, AMYLASE in the last 168 hours. No results for input(s): AMMONIA in the last 168 hours. Coagulation Profile: No results for input(s): INR, PROTIME in the last 168 hours. Cardiac Enzymes: No results for input(s): CKTOTAL, CKMB, CKMBINDEX, TROPONINI in the last 168 hours. BNP (last 3 results) No results for input(s): PROBNP in the last 8760 hours. HbA1C: No results for input(s): HGBA1C in the last 72 hours. CBG: No results for input(s): GLUCAP in the last 168 hours. Lipid Profile: No results for input(s): CHOL, HDL, LDLCALC, TRIG,  CHOLHDL, LDLDIRECT in the last 72 hours. Thyroid Function Tests: No results for input(s): TSH, T4TOTAL, FREET4, T3FREE, THYROIDAB in the last 72 hours. Anemia Panel: No results for input(s): VITAMINB12, FOLATE, FERRITIN, TIBC, IRON, RETICCTPCT in the last 72 hours. Urine analysis:    Component Value Date/Time   COLORURINE AMBER (A) 07/23/2019 2214   APPEARANCEUR CLOUDY (A) 07/23/2019 2214   LABSPEC 1.012 07/23/2019 2214   PHURINE 5.0 07/23/2019 2214   GLUCOSEU 50 (A) 07/23/2019 2214   HGBUR LARGE (A) 07/23/2019 2214   BILIRUBINUR NEGATIVE 07/23/2019 2214   KETONESUR NEGATIVE 07/23/2019 2214   PROTEINUR 30 (A) 07/23/2019 2214   NITRITE NEGATIVE 07/23/2019 2214   LEUKOCYTESUR SMALL (A) 07/23/2019 2214   Sepsis Labs: @LABRCNTIP (procalcitonin:4,lacticidven:4) )No results found for this or any previous visit (from the past 240 hour(s)).       Radiology Studies: No results found.    Scheduled Meds: . aspirin  325 mg Oral Daily  . atorvastatin  20 mg Oral q1800  . enoxaparin (LOVENOX) injection  40 mg Subcutaneous Q24H  . feeding supplement (ENSURE ENLIVE)  237 mL  Oral BID BM  . folic acid  1 mg Oral Daily  . lactulose  20 g Oral BID  . metoprolol tartrate  50 mg Oral BID  . multivitamin with minerals  1 tablet Oral Daily  . omega-3 acid ethyl esters  1 g Oral Daily  . pantoprazole  40 mg Oral Daily  . thiamine  100 mg Oral Daily   Or  . thiamine  100 mg Intravenous Daily   Continuous Infusions:   LOS: 127 days      Calvert Cantor, MD [facility] Pager: [email or web address] Password [facility] 11/27/2019, 1:54 PM

## 2019-11-27 NOTE — TOC Progression Note (Addendum)
Transition of Care [facility]) - Progression Note    Patient Details  Name: Tyler [name] MRN: [id number] Date of Birth: August 28, 1971  Transition of [facility] CM/SW Contact  Sharlet Salina Mila Homer, LCSW Phone Number: 11/27/2019, 5:02 PM  Clinical Narrative:  Contact made with [facility] in [place], [place]. Talked with Executive Director Sharee Pimple who asked that patient's clinicals be faxed to them [phone]). Sharee Pimple indicated that if they accept patient, but have no availability, they have other facilities that will be contacted regarding reviewing patient's clinicals.  CSW received a call from [place] with [facility] in [place]. CSW advised that patient's clinicals reviewed and she consulted with another staff person and they are unable to offer [name] a bed at this time. CSW expressed understanding and thanks her for considering patient.   Expected Discharge Plan: Skilled Nursing Facility Barriers to Discharge: Active Substance Use - Placement, SNF Pending payor source - LOG, SNF Pending bed offer  Expected Discharge Plan and Services Expected Discharge Plan: [place]   Discharge Planning Services: CM Consult, Other - See comment(Pt will need LOG for SNF placement) Post Acute Care Choice: [place] arrangements for the past 2 months: Single Family Home                 DME Arranged: N/A           HH Agency: NA         Social Determinants of Health (SDOH) Interventions  Continuing to search for ALF/FCH placement for patient as he needs permanent supportive housing.  Readmission Risk Interventions No flowsheet data found.

## 2019-11-28 LAB — BASIC METABOLIC PANEL
Anion gap: 8 (ref 5–15)
Anion gap: 8 (ref 5–15)
BUN: 24 mg/dL — ABNORMAL HIGH (ref 6–20)
BUN: 26 mg/dL — ABNORMAL HIGH (ref 6–20)
CO2: 24 mmol/L (ref 22–32)
CO2: 26 mmol/L (ref 22–32)
Calcium: 9 mg/dL (ref 8.9–10.3)
Calcium: 9.3 mg/dL (ref 8.9–10.3)
Chloride: 104 mmol/L (ref 98–111)
Chloride: 107 mmol/L (ref 98–111)
Creatinine, Ser: 1.39 mg/dL — ABNORMAL HIGH (ref 0.61–1.24)
Creatinine, Ser: 1.5 mg/dL — ABNORMAL HIGH (ref 0.61–1.24)
GFR calc Af Amer: >60 mL/min (ref >60)
GFR calc Af Amer: >60 mL/min (ref >60)
GFR calc non Af Amer: 54 mL/min — ABNORMAL LOW (ref >60)
GFR calc non Af Amer: 59 mL/min — ABNORMAL LOW (ref >60)
Glucose, Bld: 101 mg/dL — ABNORMAL HIGH (ref 70–99)
Glucose, Bld: 104 mg/dL — ABNORMAL HIGH (ref 70–99)
Potassium: 4.1 mmol/L (ref 3.5–5.1)
Potassium: 4.3 mmol/L (ref 3.5–5.1)
Sodium: 136 mmol/L (ref 135–145)
Sodium: 141 mmol/L (ref 135–145)

## 2019-11-28 LAB — CBC WITH DIFFERENTIAL/PLATELET
Abs Immature Granulocytes: 0.06 10*3/uL (ref 0.00–0.07)
Basophils Absolute: 0.1 10*3/uL (ref 0.0–0.1)
Basophils Relative: 1 %
Eosinophils Absolute: 0.6 10*3/uL — ABNORMAL HIGH (ref 0.0–0.5)
Eosinophils Relative: 7 %
HCT: 44.8 % (ref 39.0–52.0)
Hemoglobin: 15.1 g/dL (ref 13.0–17.0)
Immature Granulocytes: 1 %
Lymphocytes Relative: 32 %
Lymphs Abs: 2.9 10*3/uL (ref 0.7–4.0)
MCH: 30.4 pg (ref 26.0–34.0)
MCHC: 33.7 g/dL (ref 30.0–36.0)
MCV: 90.3 fL (ref 80.0–100.0)
Monocytes Absolute: 1 10*3/uL (ref 0.1–1.0)
Monocytes Relative: 11 %
Neutro Abs: 4.4 10*3/uL (ref 1.7–7.7)
Neutrophils Relative %: 48 %
Platelets: 217 10*3/uL (ref 150–400)
RBC: 4.96 MIL/uL (ref 4.22–5.81)
RDW: 11.9 % (ref 11.5–15.5)
WBC: 9 10*3/uL (ref 4.0–10.5)
nRBC: 0 % (ref 0.0–0.2)

## 2019-11-28 NOTE — Plan of Care (Signed)
  Problem: Clinical Measurements: Goal: Ability to maintain clinical measurements within normal limits will improve Outcome: Progressing   

## 2019-11-28 NOTE — Progress Notes (Signed)
PROGRESS NOTE    Tyler [name]   PZW:[id number]  DOB: 1971-08-01  DOA: 07/23/2019 PCP: Patient, No Pcp Per   Brief Narrative:  Tyler [name] is a48 y.o.malewith a history of polysubstance abuse who presented to [facility] ER after being found down with a syringe nearby. He was given Narcan with minimal improvement. On presentation, he was found to have severe rhabdomyolysis with acute kidney injury and hyperkalemia. He was started on IV fluids including bicarb drip and transferred to [facility] for further care. Rhabdomyolysis improved but he had persistence of encephalopathy and was found to have acute bilateral cerebellar infarct. Neurology was consulted and has subsequently signed off. PT recommended SNF placement. Prolonged hospitalization due to placement issues.   Patient is medically stable for discharge. Barriers to discharge currently are patient's cognitive status secondary to his issues as below as well as lack of ability to gain guardianship at this point as he does not have any family members willing to take care of him and patient is a difficult placement at SNF or ALF.  Case management attempting to reach DSS for guardianship issues.  Between 11/21/2019-11/26/2019, patient has remained medically stable without acute issues.   Subjective: The patient has no complaints    Assessment & Plan:   Principal Problem:   Acute b/l ischemic infarcts - has new cognitie deficits and is no longer able to live alone' - needs 30 day event monitor as outpt when discharged, per neuro - also per neuro, cont ASA and Statin - Carotid duplex unremarkable - ECHO showed not thrombus and EF was 60-65% - A1c 5.1%  Active Problems:   Rhabdomyolysis with AKI - has resolved - renal ultrasound was negative - following Cr    Acute metabolic encephalopathy - no suspected to be chronic encephalopathy -initially required restrains but has improved    Polysubstance abuse  -  alcohol and drug use   Essential HTN - cont Metoprolol  Severe protein calorie malnutrition - cont dietary supplements  Back pain - he has T11 and L1 fractures noted on xray - pain resolved  UTI  treated with Ceftriaxone  Severe generalized weakness - PT/OT recommends SNF   Time spent in minutes: 35  DVT prophylaxis: Lovenox Code Status: Full code Family Communication:  Disposition Plan: ALF vs SNF Consultants:   Neurology  psych Procedures:  Echocardiogram 1. The left ventricle has normal systolic function with an ejection fraction of 60-65%. The cavity size was normal. Left ventricular diastolic Doppler parameters are consistent with impaired relaxation. No evidence of left ventricular regional wall motion abnormalities. 2. The right ventricle has normal systolic function. The cavity was normal. There is no increase in right ventricular wall thickness. Right ventricular systolic pressure is normal with an estimated pressure of 21.0 mmHg. 3. The aortic valve is tricuspid. Moderate sclerosis of the aortic valve. 4. The aorta is normal unless otherwise noted.  EEG This study issuggestive of moderate diffuse encephalopathy which is nonspecific to etiology. No seizures or epileptiform discharges were seen throughout the recording.  Antimicrobials:  Anti-infectives (From admission, onward)   Start     Dose/Rate Route Frequency Ordered Stop   07/25/19 0745  cefTRIAXone (ROCEPHIN) 1 g in sodium chloride 0.9 % 100 mL IVPB     1 g 200 mL/hr over 30 Minutes Intravenous Every 24 hours 07/25/19 0734 07/29/19 1518       Objective: Vitals:   11/27/19 0934 11/27/19 1742 11/27/19 2135 11/28/19 0521  BP: 111/72 124/76 90/67 98/68   Pulse: Marland Kitchen)  56 62 66 70  Resp: 18 18 18 18   Temp: 97.8 F (36.6 C) 98 F (36.7 C) 99.1 F (37.3 C) 98.9 F (37.2 C)  TempSrc: Oral Oral Oral Oral  SpO2: 99% 99% 96% 100%  Weight:   72.5 kg   Height:        Intake/Output Summary (Last 24  hours) at 11/28/2019 1050 Last data filed at 11/28/2019 0521 Gross per 24 hour  Intake 1080 ml  Output 0 ml  Net 1080 ml   Filed Weights   11/25/19 2131 11/26/19 2132 11/27/19 2135  Weight: 72.5 kg 72.5 kg 72.5 kg    Examination: General exam: Appears comfortable  HEENT: PERRLA, oral mucosa moist, no sclera icterus or thrush Respiratory system: Clear to auscultation. Respiratory effort normal. Cardiovascular system: S1 & S2 heard,  No murmurs  Gastrointestinal system: Abdomen soft, non-tender, nondistended. Normal bowel sounds   Central nervous system: [name] and oriented. No focal neurological deficits. Extremities: No cyanosis, clubbing or edema Skin: No rashes or ulcers Psychiatry:  Mood & affect appropriate.     Data Reviewed: I have personally reviewed following labs and imaging studies  CBC: Recent Labs  Lab 11/27/19 2345  WBC 9.0  NEUTROABS 4.4  HGB 15.1  HCT 44.8  MCV 90.3  PLT 217   Basic Metabolic Panel: Recent Labs  Lab 11/25/19 0627 11/27/19 2345 11/28/19 0408  NA 141 136 141  K 4.5 4.3 4.1  CL 105 104 107  CO2 26 24 26   GLUCOSE 94 104* 101*  BUN 17 26* 24*  CREATININE 1.31* 1.39* 1.50*  CALCIUM 9.6 9.0 9.3   GFR: Estimated Creatinine Clearance: 57 mL/min (A) (by C-G formula based on SCr of 1.5 mg/dL (H)). Liver Function Tests: Recent Labs  Lab 11/25/19 0627  AST 40  ALT 54*  ALKPHOS 58  BILITOT 0.7  PROT 7.7  ALBUMIN 3.9   No results for input(s): LIPASE, AMYLASE in the last 168 hours. No results for input(s): AMMONIA in the last 168 hours. Coagulation Profile: No results for input(s): INR, PROTIME in the last 168 hours. Cardiac Enzymes: No results for input(s): CKTOTAL, CKMB, CKMBINDEX, TROPONINI in the last 168 hours. BNP (last 3 results) No results for input(s): PROBNP in the last 8760 hours. HbA1C: No results for input(s): HGBA1C in the last 72 hours. CBG: No results for input(s): GLUCAP in the last 168 hours. Lipid  Profile: No results for input(s): CHOL, HDL, LDLCALC, TRIG, CHOLHDL, LDLDIRECT in the last 72 hours. Thyroid Function Tests: No results for input(s): TSH, T4TOTAL, FREET4, T3FREE, THYROIDAB in the last 72 hours. Anemia Panel: No results for input(s): VITAMINB12, FOLATE, FERRITIN, TIBC, IRON, RETICCTPCT in the last 72 hours. Urine analysis:    Component Value Date/Time   COLORURINE AMBER (A) 07/23/2019 2214   APPEARANCEUR CLOUDY (A) 07/23/2019 2214   LABSPEC 1.012 07/23/2019 2214   PHURINE 5.0 07/23/2019 2214   GLUCOSEU 50 (A) 07/23/2019 2214   HGBUR LARGE (A) 07/23/2019 2214   BILIRUBINUR NEGATIVE 07/23/2019 2214   KETONESUR NEGATIVE 07/23/2019 2214   PROTEINUR 30 (A) 07/23/2019 2214   NITRITE NEGATIVE 07/23/2019 2214   LEUKOCYTESUR SMALL (A) 07/23/2019 2214   Sepsis Labs: @LABRCNTIP (procalcitonin:4,lacticidven:4) )No results found for this or any previous visit (from the past 240 hour(s)).       Radiology Studies: No results found.    Scheduled Meds: . aspirin  325 mg Oral Daily  . atorvastatin  20 mg Oral q1800  . enoxaparin (LOVENOX) injection  40  mg Subcutaneous Q24H  . feeding supplement (ENSURE ENLIVE)  237 mL Oral BID BM  . folic acid  1 mg Oral Daily  . lactulose  20 g Oral BID  . metoprolol tartrate  50 mg Oral BID  . multivitamin with minerals  1 tablet Oral Daily  . omega-3 acid ethyl esters  1 g Oral Daily  . pantoprazole  40 mg Oral Daily  . thiamine  100 mg Oral Daily   Or  . thiamine  100 mg Intravenous Daily   Continuous Infusions:   LOS: 128 days      Calvert CantorSaima Joceline Hinchcliff, MD [facility] Pager: [email or web address] Password [email or web address] 11/28/2019, 10:50 AM

## 2019-11-29 NOTE — Progress Notes (Signed)
PROGRESS NOTE    Tyler [name]   ZOX:[id number]  DOB: 04/14/1971  DOA: 07/23/2019 PCP: Patient, No Pcp Per   Brief Narrative:  Tyler [name] is a48 y.o.malewith a history of polysubstance abuse who [facility] after being found down with a syringe nearby. He was given Narcan with minimal improvement. On presentation, he was found to have severe rhabdomyolysis with acute kidney injury and hyperkalemia. He was started on IV fluids including bicarb drip and [facility]. Rhabdomyolysis improved but he had persistence of encephalopathy and was found to have acute bilateral cerebellar infarct. Neurology was consulted and has subsequently signed off. PT recommended SNF placement. Prolonged hospitalization due to placement issues.   Patient is medically stable for discharge. Barriers to discharge currently are patient's cognitive status secondary to his issues as below as well as lack of ability to gain guardianship at this point as he does not have any family members willing to take care of him and patient is a difficult placement at SNF or ALF.  Case management attempting to reach DSS for guardianship issues.  Between 11/21/2019-11/26/2019, patient has remained medically stable without acute issues.   Subjective: No complaints.    Assessment & Plan:   Principal Problem:   Acute b/l ischemic infarcts - has new cognitie deficits and is no longer able to live alone' - needs 30 day event monitor as outpt when discharged, per neuro - also per neuro, cont ASA and Statin - Carotid duplex unremarkable - ECHO showed not thrombus and EF was 60-65% - A1c 5.1%  Active Problems:   Rhabdomyolysis with AKI - has resolved - renal ultrasound was negative - following Cr    Acute metabolic encephalopathy - no suspected to be chronic encephalopathy -initially required restrains but has improved    Polysubstance abuse  - alcohol and drug  use   Essential HTN - cont Metoprolol  Severe protein calorie malnutrition - cont dietary supplements  Back pain - he has T11 and L1 fractures noted on xray - pain resolved  UTI  treated with Ceftriaxone  Severe generalized weakness - PT/OT recommends SNF   Time spent in minutes: 35  DVT prophylaxis: Lovenox Code Status: Full code Family Communication:  Disposition Plan: ALF vs SNF Consultants:   Neurology  psych Procedures:  Echocardiogram 1. The left ventricle has normal systolic function with an ejection fraction of 60-65%. The cavity size was normal. Left ventricular diastolic Doppler parameters are consistent with impaired relaxation. No evidence of left ventricular regional wall motion abnormalities. 2. The right ventricle has normal systolic function. The cavity was normal. There is no increase in right ventricular wall thickness. Right ventricular systolic pressure is normal with an estimated pressure of 21.0 mmHg. 3. The aortic valve is tricuspid. Moderate sclerosis of the aortic valve. 4. The aorta is normal unless otherwise noted.  EEG This study issuggestive of moderate diffuse encephalopathy which is nonspecific to etiology. No seizures or epileptiform discharges were seen throughout the recording.  Antimicrobials:  Anti-infectives (From admission, onward)   Start     Dose/Rate Route Frequency Ordered Stop   07/25/19 0745  cefTRIAXone (ROCEPHIN) 1 g in sodium chloride 0.9 % 100 mL IVPB     1 g 200 mL/hr over 30 Minutes Intravenous Every 24 hours 07/25/19 0734 07/29/19 1518       Objective: Vitals:   11/28/19 1701 11/28/19 2044 11/29/19 0419 11/29/19 0800  BP: 134/84 109/69 105/83 (!) 146/105  Pulse: (!) 57  66 78 81  Resp: 20 16 16 16   Temp: 98 F (36.7 C) 98.1 F (36.7 C) 98.1 F (36.7 C) 97.7 F (36.5 C)  TempSrc: Oral Oral Oral Oral  SpO2: 100% 95% 96% 96%  Weight:      Height:        Intake/Output Summary (Last 24 hours) at  11/29/2019 1253 Last data filed at 11/29/2019 0200 Gross per 24 hour  Intake 1080 ml  Output 1200 ml  Net -120 ml   Filed Weights   11/25/19 2131 11/26/19 2132 11/27/19 2135  Weight: 72.5 kg 72.5 kg 72.5 kg    Examination: General exam: Appears comfortable  HEENT: PERRLA, oral mucosa moist, no sclera icterus or thrush Respiratory system: Clear to auscultation. Respiratory effort normal. Cardiovascular system: S1 & S2 heard,  No murmurs  Gastrointestinal system: Abdomen soft, non-tender, nondistended. Normal bowel sounds   Central nervous system: Alert and oriented. No focal neurological deficits. Extremities: No cyanosis, clubbing or edema Skin: No rashes or ulcers Psychiatry:  Mood & affect appropriate.     Data Reviewed: I have personally reviewed following labs and imaging studies  CBC: Recent Labs  Lab 11/27/19 2345  WBC 9.0  NEUTROABS 4.4  HGB 15.1  HCT 44.8  MCV 90.3  PLT 130   Basic Metabolic Panel: Recent Labs  Lab 11/25/19 0627 11/27/19 2345 11/28/19 0408  NA 141 136 141  K 4.5 4.3 4.1  CL 105 104 107  CO2 26 24 26   GLUCOSE 94 104* 101*  BUN 17 26* 24*  CREATININE 1.31* 1.39* 1.50*  CALCIUM 9.6 9.0 9.3   GFR: Estimated Creatinine Clearance: 57 mL/min (A) (by C-G formula based on SCr of 1.5 mg/dL (H)). Liver Function Tests: Recent Labs  Lab 11/25/19 0627  AST 40  ALT 54*  ALKPHOS 58  BILITOT 0.7  PROT 7.7  ALBUMIN 3.9   No results for input(s): LIPASE, AMYLASE in the last 168 hours. No results for input(s): AMMONIA in the last 168 hours. Coagulation Profile: No results for input(s): INR, PROTIME in the last 168 hours. Cardiac Enzymes: No results for input(s): CKTOTAL, CKMB, CKMBINDEX, TROPONINI in the last 168 hours. BNP (last 3 results) No results for input(s): PROBNP in the last 8760 hours. HbA1C: No results for input(s): HGBA1C in the last 72 hours. CBG: No results for input(s): GLUCAP in the last 168 hours. Lipid Profile: No  results for input(s): CHOL, HDL, LDLCALC, TRIG, CHOLHDL, LDLDIRECT in the last 72 hours. Thyroid Function Tests: No results for input(s): TSH, T4TOTAL, FREET4, T3FREE, THYROIDAB in the last 72 hours. Anemia Panel: No results for input(s): VITAMINB12, FOLATE, FERRITIN, TIBC, IRON, RETICCTPCT in the last 72 hours. Urine analysis:    Component Value Date/Time   COLORURINE AMBER (A) 07/23/2019 2214   APPEARANCEUR CLOUDY (A) 07/23/2019 2214   LABSPEC 1.012 07/23/2019 2214   PHURINE 5.0 07/23/2019 2214   GLUCOSEU 50 (A) 07/23/2019 2214   HGBUR LARGE (A) 07/23/2019 2214   BILIRUBINUR NEGATIVE 07/23/2019 2214   [place] 07/23/2019 2214   PROTEINUR 30 (A) 07/23/2019 2214   NITRITE NEGATIVE 07/23/2019 2214   LEUKOCYTESUR SMALL (A) 07/23/2019 2214   Sepsis Labs: @LABRCNTIP (procalcitonin:4,lacticidven:4) )No results found for this or any previous visit (from the past 240 hour(s)).       Radiology Studies: No results found.    Scheduled Meds: . aspirin  325 mg Oral Daily  . atorvastatin  20 mg Oral q1800  . enoxaparin (LOVENOX) injection  40 mg Subcutaneous  Q24H  . feeding supplement (ENSURE ENLIVE)  237 mL Oral BID BM  . folic acid  1 mg Oral Daily  . lactulose  20 g Oral BID  . metoprolol tartrate  50 mg Oral BID  . multivitamin with minerals  1 tablet Oral Daily  . omega-3 acid ethyl esters  1 g Oral Daily  . pantoprazole  40 mg Oral Daily  . thiamine  100 mg Oral Daily   Or  . thiamine  100 mg Intravenous Daily   Continuous Infusions:   LOS: 129 days      Calvert Cantor, MD [facility] Pager: [email or web address] Password [facility] 11/29/2019, 12:53 PM

## 2019-11-29 NOTE — Plan of Care (Signed)
  Problem: Clinical Measurements: Goal: Ability to maintain clinical measurements within normal limits will improve Outcome: Progressing   Problem: Health Behavior/Discharge Planning: Goal: Ability to manage health-related needs will improve Outcome: Progressing   

## 2019-11-29 NOTE — Plan of Care (Signed)
  Problem: Clinical Measurements: Goal: Cardiovascular complication will be avoided Outcome: Progressing   

## 2019-11-30 NOTE — Plan of Care (Signed)
  Problem: Clinical Measurements: Goal: Ability to maintain clinical measurements within normal limits will improve Outcome: Adequate for Discharge   

## 2019-11-30 NOTE — Progress Notes (Signed)
PROGRESS NOTE    Tyler [name]   DDU:[id number]  DOB: 11/15/1971  DOA: 07/23/2019 PCP: Patient, No Pcp Per   Brief Narrative:  Tyler [name] is a48 y.o.malewith a history of polysubstance abuse who presented to [facility] ER after being found down with a syringe nearby. He was given Narcan with minimal improvement. On presentation, he was found to have severe rhabdomyolysis with acute kidney injury and hyperkalemia. He was started on IV fluids including bicarb drip and transferred to [facility] for further care. Rhabdomyolysis improved but he had persistence of encephalopathy and was found to have acute bilateral cerebellar infarct. Neurology was consulted and has subsequently signed off. PT recommended SNF placement. Prolonged hospitalization due to placement issues.   Patient is medically stable for discharge. Barriers to discharge currently are patient's cognitive status secondary to his issues as below as well as lack of ability to gain guardianship at this point as he does not have any family members willing to take care of him and patient is a difficult placement at SNF or ALF.  Case management attempting to reach DSS for guardianship issues.     Subjective: No complaints.    Assessment & Plan:   Principal Problem:   Acute b/l ischemic infarcts - has new cognitie deficits and is no longer able to live alone' - needs 30 day event monitor as outpt when discharged, per neuro - also per neuro, cont ASA and Statin - Carotid duplex unremarkable - ECHO showed not thrombus and EF was 60-65% - A1c 5.1%  Active Problems:   Rhabdomyolysis with AKI - has resolved - renal ultrasound was negative - following Cr    Acute metabolic encephalopathy - no suspected to be chronic encephalopathy -initially required restrains but has improved    Polysubstance abuse  - alcohol and drug use   Essential HTN - cont Metoprolol  Severe protein calorie malnutrition - cont  dietary supplements  Back pain - he has T11 and L1 fractures noted on xray - pain resolved  UTI  treated with Ceftriaxone  Severe generalized weakness - PT/OT recommends SNF   Time spent in minutes: 35  DVT prophylaxis: Lovenox Code Status: Full code Family Communication:  Disposition Plan: ALF vs SNF Consultants:   Neurology  psych Procedures:  Echocardiogram 1. The left ventricle has normal systolic function with an ejection fraction of 60-65%. The cavity size was normal. Left ventricular diastolic Doppler parameters are consistent with impaired relaxation. No evidence of left ventricular regional wall motion abnormalities. 2. The right ventricle has normal systolic function. The cavity was normal. There is no increase in right ventricular wall thickness. Right ventricular systolic pressure is normal with an estimated pressure of 21.0 mmHg. 3. The aortic valve is tricuspid. Moderate sclerosis of the aortic valve. 4. The aorta is normal unless otherwise noted.  EEG This study issuggestive of moderate diffuse encephalopathy which is nonspecific to etiology. No seizures or epileptiform discharges were seen throughout the recording.  Antimicrobials:  Anti-infectives (From admission, onward)   Start     Dose/Rate Route Frequency Ordered Stop   07/25/19 0745  cefTRIAXone (ROCEPHIN) 1 g in sodium chloride 0.9 % 100 mL IVPB     1 g 200 mL/hr over 30 Minutes Intravenous Every 24 hours 07/25/19 0734 07/29/19 1518       Objective: Vitals:   11/29/19 1700 11/29/19 2059 11/30/19 0614 11/30/19 0911  BP: 140/81 115/85 103/80 (!) 131/96  Pulse: 74 78 80 78  Resp: 18 16 18  18  Temp: 97.6 F (36.4 C) 97.9 F (36.6 C) 97.9 F (36.6 C) 98.1 F (36.7 C)  TempSrc: Oral Oral Oral Oral  SpO2:  96% 96% 98%  Weight:      Height:        Intake/Output Summary (Last 24 hours) at 11/30/2019 1311 Last data filed at 11/29/2019 1739 Gross per 24 hour  Intake 220 ml  Output --   Net 220 ml   Filed Weights   11/25/19 2131 11/26/19 2132 11/27/19 2135  Weight: 72.5 kg 72.5 kg 72.5 kg    Examination: General exam: Appears comfortable  HEENT: PERRLA, oral mucosa moist, no sclera icterus or thrush Respiratory system: Clear to auscultation. Respiratory effort normal. Cardiovascular system: S1 & S2 heard,  No murmurs  Gastrointestinal system: Abdomen soft, non-tender, nondistended. Normal bowel sounds   Central nervous system: [name] and oriented. No focal neurological deficits. Extremities: No cyanosis, clubbing or edema Skin: No rashes or ulcers Psychiatry:  Mood & affect appropriate.  Data Reviewed: I have personally reviewed following labs and imaging studies  CBC: Recent Labs  Lab 11/27/19 2345  WBC 9.0  NEUTROABS 4.4  HGB 15.1  HCT 44.8  MCV 90.3  PLT 217   Basic Metabolic Panel: Recent Labs  Lab 11/25/19 0627 11/27/19 2345 11/28/19 0408  NA 141 136 141  K 4.5 4.3 4.1  CL 105 104 107  CO2 26 24 26   GLUCOSE 94 104* 101*  BUN 17 26* 24*  CREATININE 1.31* 1.39* 1.50*  CALCIUM 9.6 9.0 9.3   GFR: Estimated Creatinine Clearance: 57 mL/min (A) (by C-G formula based on SCr of 1.5 mg/dL (H)). Liver Function Tests: Recent Labs  Lab 11/25/19 0627  AST 40  ALT 54*  ALKPHOS 58  BILITOT 0.7  PROT 7.7  ALBUMIN 3.9   No results for input(s): LIPASE, AMYLASE in the last 168 hours. No results for input(s): AMMONIA in the last 168 hours. Coagulation Profile: No results for input(s): INR, PROTIME in the last 168 hours. Cardiac Enzymes: No results for input(s): CKTOTAL, CKMB, CKMBINDEX, TROPONINI in the last 168 hours. BNP (last 3 results) No results for input(s): PROBNP in the last 8760 hours. HbA1C: No results for input(s): HGBA1C in the last 72 hours. CBG: No results for input(s): GLUCAP in the last 168 hours. Lipid Profile: No results for input(s): CHOL, HDL, LDLCALC, TRIG, CHOLHDL, LDLDIRECT in the last 72 hours. Thyroid Function  Tests: No results for input(s): TSH, T4TOTAL, FREET4, T3FREE, THYROIDAB in the last 72 hours. Anemia Panel: No results for input(s): VITAMINB12, FOLATE, FERRITIN, TIBC, IRON, RETICCTPCT in the last 72 hours. Urine analysis:    Component Value Date/Time   COLORURINE AMBER (A) 07/23/2019 2214   APPEARANCEUR CLOUDY (A) 07/23/2019 2214   LABSPEC 1.012 07/23/2019 2214   PHURINE 5.0 07/23/2019 2214   GLUCOSEU 50 (A) 07/23/2019 2214   HGBUR LARGE (A) 07/23/2019 2214   BILIRUBINUR NEGATIVE 07/23/2019 2214   KETONESUR NEGATIVE 07/23/2019 2214   PROTEINUR 30 (A) 07/23/2019 2214   NITRITE NEGATIVE 07/23/2019 2214   LEUKOCYTESUR SMALL (A) 07/23/2019 2214   Sepsis Labs: @LABRCNTIP (procalcitonin:4,lacticidven:4) )No results found for this or any previous visit (from the past 240 hour(s)).       Radiology Studies: No results found.    Scheduled Meds: . aspirin  325 mg Oral Daily  . atorvastatin  20 mg Oral q1800  . enoxaparin (LOVENOX) injection  40 mg Subcutaneous Q24H  . feeding supplement (ENSURE ENLIVE)  237 mL Oral BID BM  .  folic acid  1 mg Oral Daily  . lactulose  20 g Oral BID  . metoprolol tartrate  50 mg Oral BID  . multivitamin with minerals  1 tablet Oral Daily  . omega-3 acid ethyl esters  1 g Oral Daily  . pantoprazole  40 mg Oral Daily  . thiamine  100 mg Oral Daily   Or  . thiamine  100 mg Intravenous Daily   Continuous Infusions:   LOS: 130 days      Calvert Cantor, MD [facility] Pager: [email or web address] Password [email or web address] 11/30/2019, 1:11 PM

## 2019-12-01 MED ORDER — ASPIRIN 325 MG PO TABS
ORAL_TABLET | ORAL | Status: AC
  Filled 2019-12-01: qty 1

## 2019-12-01 NOTE — Progress Notes (Signed)
PROGRESS NOTE    Tyler [name]   KVQ:[id number]  DOB: [birth year]  DOA: 07/23/2019 PCP: Patient, No Pcp Per   Brief Narrative:  Tyler [name] is a48 y.o.malewith a history of polysubstance abuse who presented to [facility] ER after being found down with a syringe nearby. He was given Narcan with minimal improvement. On presentation, he was found to have severe rhabdomyolysis with acute kidney injury and hyperkalemia. He was started on IV fluids including bicarb drip and transferred to [facility] for further care. Rhabdomyolysis improved but he had persistence of encephalopathy and was found to have acute bilateral cerebellar infarct. Neurology was consulted and has subsequently signed off. PT recommended SNF placement. Prolonged hospitalization due to placement issues.   Patient is medically stable for discharge. Barriers to discharge currently are patient's cognitive status secondary to his issues as below as well as lack of ability to gain guardianship at this point as he does not have any family members willing to take care of him and patient is a difficult placement at SNF or ALF.  Case management attempting to reach DSS for guardianship issues.     Subjective: No complaints.    Assessment & Plan:   Principal Problem:   Acute b/l ischemic infarcts - has new cognitie deficits and is no longer able to live alone' - needs 30 day event monitor as outpt when discharged, per neuro - also per neuro, cont ASA and Statin - Carotid duplex unremarkable - ECHO showed not thrombus and EF was 60-65% - A1c 5.1%  Active Problems:   Rhabdomyolysis with AKI - has resolved - renal ultrasound was negative     Acute metabolic encephalopathy - no suspected to be chronic encephalopathy -initially required restrains but has improved    Polysubstance abuse  - alcohol and drug use   Essential HTN - cont Metoprolol  Severe protein calorie malnutrition - cont dietary  supplements  Back pain - he has T11 and L1 fractures noted on xray - pain resolved  UTI  treated with Ceftriaxone  Severe generalized weakness - PT/OT recommends SNF   Time spent in minutes: 35  DVT prophylaxis: Lovenox Code Status: Full code Family Communication:  Disposition Plan: ALF vs SNF Consultants:   Neurology  psych Procedures:  Echocardiogram 1. The left ventricle has normal systolic function with an ejection fraction of 60-65%. The cavity size was normal. Left ventricular diastolic Doppler parameters are consistent with impaired relaxation. No evidence of left ventricular regional wall motion abnormalities. 2. The right ventricle has normal systolic function. The cavity was normal. There is no increase in right ventricular wall thickness. Right ventricular systolic pressure is normal with an estimated pressure of 21.0 mmHg. 3. The aortic valve is tricuspid. Moderate sclerosis of the aortic valve. 4. The aorta is normal unless otherwise noted.  EEG This study issuggestive of moderate diffuse encephalopathy which is nonspecific to etiology. No seizures or epileptiform discharges were seen throughout the recording.  Antimicrobials:  Anti-infectives (From admission, onward)   Start     Dose/Rate Route Frequency Ordered Stop   07/25/19 0745  cefTRIAXone (ROCEPHIN) 1 g in sodium chloride 0.9 % 100 mL IVPB     1 g 200 mL/hr over 30 Minutes Intravenous Every 24 hours 07/25/19 0734 07/29/19 1518       Objective: Vitals:   11/30/19 0911 11/30/19 2100 12/01/19 0537 12/01/19 0853  BP: (!) 131/96 108/74 110/70 120/87  Pulse: 78 76 78 (!) 59  Resp: 18 18 18 18   Temp:  98.1 F (36.7 C) 98 F (36.7 C) 98 F (36.7 C)   TempSrc: Oral Oral Oral   SpO2: 98% 98% 99% 99%  Weight:      Height:        Intake/Output Summary (Last 24 hours) at 12/01/2019 1307 Last data filed at 12/01/2019 0537 Gross per 24 hour  Intake 237 ml  Output 0 ml  Net 237 ml   Filed  Weights   11/25/19 2131 11/26/19 2132 11/27/19 2135  Weight: 72.5 kg 72.5 kg 72.5 kg    Examination: General exam: Appears comfortable  HEENT: PERRLA, oral mucosa moist, no sclera icterus or thrush Respiratory system: Clear to auscultation. Respiratory effort normal. Cardiovascular system: S1 & S2 heard,  No murmurs  Gastrointestinal system: Abdomen soft, non-tender, nondistended. Normal bowel sounds   Central nervous system: [name] and oriented. No focal neurological deficits. Extremities: No cyanosis, clubbing or edema Skin: No rashes or ulcers Psychiatry:  Mood & affect appropriate.  Data Reviewed: I have personally reviewed following labs and imaging studies  CBC: Recent Labs  Lab 11/27/19 2345  WBC 9.0  NEUTROABS 4.4  HGB 15.1  HCT 44.8  MCV 90.3  PLT 217   Basic Metabolic Panel: Recent Labs  Lab 11/25/19 0627 11/27/19 2345 11/28/19 0408  NA 141 136 141  K 4.5 4.3 4.1  CL 105 104 107  CO2 26 24 26   GLUCOSE 94 104* 101*  BUN 17 26* 24*  CREATININE 1.31* 1.39* 1.50*  CALCIUM 9.6 9.0 9.3   GFR: Estimated Creatinine Clearance: 57 mL/min (A) (by C-G formula based on SCr of 1.5 mg/dL (H)). Liver Function Tests: Recent Labs  Lab 11/25/19 0627  AST 40  ALT 54*  ALKPHOS 58  BILITOT 0.7  PROT 7.7  ALBUMIN 3.9   No results for input(s): LIPASE, AMYLASE in the last 168 hours. No results for input(s): AMMONIA in the last 168 hours. Coagulation Profile: No results for input(s): INR, PROTIME in the last 168 hours. Cardiac Enzymes: No results for input(s): CKTOTAL, CKMB, CKMBINDEX, TROPONINI in the last 168 hours. BNP (last 3 results) No results for input(s): PROBNP in the last 8760 hours. HbA1C: No results for input(s): HGBA1C in the last 72 hours. CBG: No results for input(s): GLUCAP in the last 168 hours. Lipid Profile: No results for input(s): CHOL, HDL, LDLCALC, TRIG, CHOLHDL, LDLDIRECT in the last 72 hours. Thyroid Function Tests: No results for  input(s): TSH, T4TOTAL, FREET4, T3FREE, THYROIDAB in the last 72 hours. Anemia Panel: No results for input(s): VITAMINB12, FOLATE, FERRITIN, TIBC, IRON, RETICCTPCT in the last 72 hours. Urine analysis:    Component Value Date/Time   COLORURINE AMBER (A) 07/23/2019 2214   APPEARANCEUR CLOUDY (A) 07/23/2019 2214   LABSPEC 1.012 07/23/2019 2214   PHURINE 5.0 07/23/2019 2214   GLUCOSEU 50 (A) 07/23/2019 2214   HGBUR LARGE (A) 07/23/2019 2214   BILIRUBINUR NEGATIVE 07/23/2019 2214   KETONESUR NEGATIVE 07/23/2019 2214   PROTEINUR 30 (A) 07/23/2019 2214   NITRITE NEGATIVE 07/23/2019 2214   LEUKOCYTESUR SMALL (A) 07/23/2019 2214   Sepsis Labs: @LABRCNTIP (procalcitonin:4,lacticidven:4) )No results found for this or any previous visit (from the past 240 hour(s)).       Radiology Studies: No results found.    Scheduled Meds: . aspirin  325 mg Oral Daily  . atorvastatin  20 mg Oral q1800  . enoxaparin (LOVENOX) injection  40 mg Subcutaneous Q24H  . feeding supplement (ENSURE ENLIVE)  237 mL Oral BID BM  . folic acid  1 mg Oral Daily  . lactulose  20 g Oral BID  . metoprolol tartrate  50 mg Oral BID  . multivitamin with minerals  1 tablet Oral Daily  . omega-3 acid ethyl esters  1 g Oral Daily  . pantoprazole  40 mg Oral Daily  . thiamine  100 mg Oral Daily   Or  . thiamine  100 mg Intravenous Daily   Continuous Infusions:   LOS: 131 days      Calvert Cantor, MD [facility] Pager: [email or web address] Password [email or web address] 12/01/2019, 1:07 PM

## 2019-12-01 NOTE — Plan of Care (Signed)
  Problem: Health Behavior/Discharge Planning: Goal: Ability to manage health-related needs will improve Outcome: Progressing   

## 2019-12-02 NOTE — Plan of Care (Signed)
  Problem: Health Behavior/Discharge Planning: Goal: Ability to manage health-related needs will improve Outcome: Progressing   

## 2019-12-02 NOTE — Progress Notes (Signed)
PROGRESS NOTE    Tyler [name]   ZOX:[id number]  DOB: 16-Feb-1971  DOA: 07/23/2019 PCP: Patient, No Pcp Per   Brief Narrative:  Tyler [name] is a48 y.o.malewith a history of polysubstance abuse who presented to [facility] ER after being found down with a syringe nearby. He was given Narcan with minimal improvement. On presentation, he was found to have severe rhabdomyolysis with acute kidney injury and hyperkalemia. He was started on IV fluids including bicarb drip and transferred to [facility] for further care. Rhabdomyolysis improved but he had persistence of encephalopathy and was found to have acute bilateral cerebellar infarct. Neurology was consulted and has subsequently signed off. PT recommended SNF placement. Prolonged hospitalization due to placement issues.   Patient is medically stable for discharge. Barriers to discharge currently are patient's cognitive status secondary to his issues as below as well as lack of ability to gain guardianship at this point as he does not have any family members willing to take care of him and patient is a difficult placement at SNF or ALF.  Case management attempting to reach DSS for guardianship issues.     Subjective: No complaints.     Assessment & Plan:   Principal Problem:   Acute b/l ischemic infarcts - has new cognitie deficits and is no longer able to live alone' - needs 30 day event monitor as outpt when discharged, per neuro - also per neuro, cont ASA and Statin - Carotid duplex unremarkable - ECHO showed not thrombus and EF was 60-65% - A1c 5.1%  Active Problems:   Rhabdomyolysis with AKI - has resolved - renal ultrasound was negative     Acute metabolic encephalopathy - no suspected to be chronic encephalopathy -initially required restrains but has improved    Polysubstance abuse  - alcohol and drug use   Essential HTN - cont Metoprolol  Severe protein calorie malnutrition - cont dietary  supplements  Back pain - he has T11 and L1 fractures noted on xray - pain resolved  UTI  treated with Ceftriaxone  Severe generalized weakness - PT/OT recommends SNF   Time spent in minutes: 35  DVT prophylaxis: Lovenox Code Status: Full code Family Communication:  Disposition Plan: ALF vs SNF Consultants:   Neurology  psych Procedures:  Echocardiogram 1. The left ventricle has normal systolic function with an ejection fraction of 60-65%. The cavity size was normal. Left ventricular diastolic Doppler parameters are consistent with impaired relaxation. No evidence of left ventricular regional wall motion abnormalities. 2. The right ventricle has normal systolic function. The cavity was normal. There is no increase in right ventricular wall thickness. Right ventricular systolic pressure is normal with an estimated pressure of 21.0 mmHg. 3. The aortic valve is tricuspid. Moderate sclerosis of the aortic valve. 4. The aorta is normal unless otherwise noted.  EEG This study issuggestive of moderate diffuse encephalopathy which is nonspecific to etiology. No seizures or epileptiform discharges were seen throughout the recording.  Antimicrobials:  Anti-infectives (From admission, onward)   Start     Dose/Rate Route Frequency Ordered Stop   07/25/19 0745  cefTRIAXone (ROCEPHIN) 1 g in sodium chloride 0.9 % 100 mL IVPB     1 g 200 mL/hr over 30 Minutes Intravenous Every 24 hours 07/25/19 0734 07/29/19 1518       Objective: Vitals:   12/01/19 1700 12/01/19 2017 12/02/19 0419 12/02/19 0920  BP: 129/79 101/72 107/85 (!) 132/105  Pulse: 66 66 62 74  Resp: 20 20 20 18   Temp:  98 F (36.7 C) 98 F (36.7 C) 98 F (36.7 C) 97.8 F (36.6 C)  TempSrc: Oral Oral Oral Oral  SpO2: 100% 95% 95% 99%  Weight:      Height:        Intake/Output Summary (Last 24 hours) at 12/02/2019 1301 Last data filed at 12/02/2019 1230 Gross per 24 hour  Intake 780 ml  Output --  Net 780 ml    Filed Weights   11/25/19 2131 11/26/19 2132 11/27/19 2135  Weight: 72.5 kg 72.5 kg 72.5 kg    Examination: General exam: Appears comfortable  HEENT: PERRLA, oral mucosa moist, no sclera icterus or thrush Respiratory system: Clear to auscultation. Respiratory effort normal. Cardiovascular system: S1 & S2 heard,  No murmurs  Gastrointestinal system: Abdomen soft, non-tender, nondistended. Normal bowel sounds   Central nervous system: [name] and oriented. No focal neurological deficits. Extremities: No cyanosis, clubbing or edema Skin: No rashes or ulcers Psychiatry:  Mood & affect appropriate.  Data Reviewed: I have personally reviewed following labs and imaging studies  CBC: Recent Labs  Lab 11/27/19 2345  WBC 9.0  NEUTROABS 4.4  HGB 15.1  HCT 44.8  MCV 90.3  PLT 217   Basic Metabolic Panel: Recent Labs  Lab 11/27/19 2345 11/28/19 0408  NA 136 141  K 4.3 4.1  CL 104 107  CO2 24 26  GLUCOSE 104* 101*  BUN 26* 24*  CREATININE 1.39* 1.50*  CALCIUM 9.0 9.3   GFR: Estimated Creatinine Clearance: 57 mL/min (A) (by C-G formula based on SCr of 1.5 mg/dL (H)). Liver Function Tests: No results for input(s): AST, ALT, ALKPHOS, BILITOT, PROT, ALBUMIN in the last 168 hours. No results for input(s): LIPASE, AMYLASE in the last 168 hours. No results for input(s): AMMONIA in the last 168 hours. Coagulation Profile: No results for input(s): INR, PROTIME in the last 168 hours. Cardiac Enzymes: No results for input(s): CKTOTAL, CKMB, CKMBINDEX, TROPONINI in the last 168 hours. BNP (last 3 results) No results for input(s): PROBNP in the last 8760 hours. HbA1C: No results for input(s): HGBA1C in the last 72 hours. CBG: No results for input(s): GLUCAP in the last 168 hours. Lipid Profile: No results for input(s): CHOL, HDL, LDLCALC, TRIG, CHOLHDL, LDLDIRECT in the last 72 hours. Thyroid Function Tests: No results for input(s): TSH, T4TOTAL, FREET4, T3FREE, THYROIDAB in the  last 72 hours. Anemia Panel: No results for input(s): VITAMINB12, FOLATE, FERRITIN, TIBC, IRON, RETICCTPCT in the last 72 hours. Urine analysis:    Component Value Date/Time   COLORURINE AMBER (A) 07/23/2019 2214   APPEARANCEUR CLOUDY (A) 07/23/2019 2214   LABSPEC 1.012 07/23/2019 2214   PHURINE 5.0 07/23/2019 2214   GLUCOSEU 50 (A) 07/23/2019 2214   HGBUR LARGE (A) 07/23/2019 2214   BILIRUBINUR NEGATIVE 07/23/2019 2214   KETONESUR NEGATIVE 07/23/2019 2214   PROTEINUR 30 (A) 07/23/2019 2214   NITRITE NEGATIVE 07/23/2019 2214   LEUKOCYTESUR SMALL (A) 07/23/2019 2214   Sepsis Labs: @LABRCNTIP (procalcitonin:4,lacticidven:4) )No results found for this or any previous visit (from the past 240 hour(s)).       Radiology Studies: No results found.    Scheduled Meds: . aspirin  325 mg Oral Daily  . atorvastatin  20 mg Oral q1800  . enoxaparin (LOVENOX) injection  40 mg Subcutaneous Q24H  . feeding supplement (ENSURE ENLIVE)  237 mL Oral BID BM  . folic acid  1 mg Oral Daily  . lactulose  20 g Oral BID  . metoprolol tartrate  50  mg Oral BID  . multivitamin with minerals  1 tablet Oral Daily  . omega-3 acid ethyl esters  1 g Oral Daily  . pantoprazole  40 mg Oral Daily  . thiamine  100 mg Oral Daily   Or  . thiamine  100 mg Intravenous Daily   Continuous Infusions:   LOS: 132 days      Calvert CantorSaima Abriana Saltos, MD [facility] Pager: [email or web address] Password [email or web address] 12/02/2019, 1:01 PM

## 2019-12-03 NOTE — Progress Notes (Signed)
Nutrition Follow-up  DOCUMENTATION CODES:   Severe malnutrition in context of acute illness/injury  INTERVENTION:   - Continue Ensure Enlive po BID, each supplement provides 350 kcal and 20 grams of protein  - Continue Magic cup TID with meals, each supplement provides 290 kcal and 9 grams of protein  - Continue MVI with minerals daily  NUTRITION DIAGNOSIS:   Severe Malnutrition related to acute illness (acute toxic/metabolic encephalopathy) as evidenced by moderate fat depletion, severe fat depletion, moderate muscle depletion, severe muscle depletion, energy intake < or equal to 50% for > or equal to 5 days, percent weight loss.  Shows weight gain/great appetite   GOAL:   Patient will meet greater than or equal to 90% of their needs  Meeting   MONITOR:   PO intake, Supplement acceptance, Labs, Weight trends, Skin, I & O's  REASON FOR ASSESSMENT:   LOS    ASSESSMENT:   Tyler [name] is a 48 y.o. male with medical history significant of polysubstance abuse including heroin.  Brought in to [facility] ED just before MN after being found down by friend last evening with nearby syringe.  Pt is medically stable for discharge, awaiting SNF placement.  Reports appetite is stable. Eating 75-100% at each meal. Drinking Ensure 1-2 times daily. Does not wish to have anything extra.   Admission weight: 73 kg  Current weight: 72 kg    Medications: folic acid, lactulose, MVI with minerals, thimaine Labs: reviewed   Diet Order:   Diet Order            DIET DYS 3 Room service appropriate? No; Fluid consistency: Thin  Diet effective now              EDUCATION NEEDS:   Not appropriate for education at this time  Skin:  Skin Assessment: Reviewed RN Assessment  Last BM:  12/27  Height:   Ht Readings from Last 1 Encounters:  08/09/19 5\' 8"  (1.727 m)    Weight:   Wt Readings from Last 1 Encounters:  12/02/19 72 kg    Ideal Body Weight:  70 kg  BMI:  Body mass  index is 24.14 kg/m.  Estimated Nutritional Needs:   Kcal:  2000-2200  Protein:  105-120 grams  Fluid:  > 2 L   Mariana Single RD, LDN Clinical Nutrition Pager # [phone]

## 2019-12-03 NOTE — Progress Notes (Signed)
PROGRESS NOTE    Tyler [name]   ZOX:[id number]  DOB: 10/26/1971  DOA: 07/23/2019 PCP: Patient, No Pcp Per   Brief Narrative:  Tyler [name] is a48 y.o.malewith a history of polysubstance abuse who presented to [facility] ER after being found down with a syringe nearby. He was given Narcan with minimal improvement. On presentation, he was found to have severe rhabdomyolysis with acute kidney injury and hyperkalemia. He was started on IV fluids including bicarb drip and transferred to [facility] for further care. Rhabdomyolysis improved but he had persistence of encephalopathy and was found to have acute bilateral cerebellar infarct. Neurology was consulted and has subsequently signed off. PT recommended SNF placement. Prolonged hospitalization due to placement issues.   Patient is medically stable for discharge. Barriers to discharge currently are patient's cognitive status secondary to his issues as below as well as lack of ability to gain guardianship at this point as he does not have any family members willing to take care of him and patient is a difficult placement at SNF or ALF.  Case management attempting to reach DSS for guardianship issues.     Subjective: He has no complaints.     Assessment & Plan:   Principal Problem:   Acute b/l ischemic infarcts - has new cognitie deficits and is no longer able to live alone - needs 30 day event monitor as outpt when discharged, per neuro - also per neuro, cont ASA and Statin - Carotid duplex unremarkable - ECHO showed not thrombus and EF was 60-65% - A1c 5.1%  Active Problems:   Rhabdomyolysis with AKI - has resolved - renal ultrasound was negative     Acute metabolic encephalopathy - no suspected to be chronic encephalopathy -initially required restrains but has improved    Polysubstance abuse  - alcohol and drug use   Essential HTN - cont Metoprolol  Severe protein calorie malnutrition - cont dietary  supplements  Back pain - he has T11 and L1 fractures noted on xray - pain resolved  UTI  treated with Ceftriaxone  Severe generalized weakness - PT/OT recommends SNF   Time spent in minutes: 35  DVT prophylaxis: Lovenox Code Status: Full code Family Communication:  Disposition Plan: ALF vs SNF Consultants:   Neurology  psych Procedures:  Echocardiogram 1. The left ventricle has normal systolic function with an ejection fraction of 60-65%. The cavity size was normal. Left ventricular diastolic Doppler parameters are consistent with impaired relaxation. No evidence of left ventricular regional wall motion abnormalities. 2. The right ventricle has normal systolic function. The cavity was normal. There is no increase in right ventricular wall thickness. Right ventricular systolic pressure is normal with an estimated pressure of 21.0 mmHg. 3. The aortic valve is tricuspid. Moderate sclerosis of the aortic valve. 4. The aorta is normal unless otherwise noted.  EEG This study issuggestive of moderate diffuse encephalopathy which is nonspecific to etiology. No seizures or epileptiform discharges were seen throughout the recording.  Antimicrobials:  Anti-infectives (From admission, onward)   Start     Dose/Rate Route Frequency Ordered Stop   07/25/19 0745  cefTRIAXone (ROCEPHIN) 1 g in sodium chloride 0.9 % 100 mL IVPB     1 g 200 mL/hr over 30 Minutes Intravenous Every 24 hours 07/25/19 0734 07/29/19 1518       Objective: Vitals:   12/02/19 1645 12/02/19 2018 12/03/19 0618 12/03/19 0845  BP: (!) 140/96 (!) 138/107 114/84 (!) 129/99  Pulse: 68 79 78 72  Resp: 20 16  18 16  Temp: 97.6 F (36.4 C) (!) 97.4 F (36.3 C) 97.9 F (36.6 C) (!) 97.4 F (36.3 C)  TempSrc: Oral Oral Oral Oral  SpO2:  98% 99% 99%  Weight:  72 kg    Height:        Intake/Output Summary (Last 24 hours) at 12/03/2019 1338 Last data filed at 12/03/2019 1245 Gross per 24 hour  Intake 1560 ml    Output 700 ml  Net 860 ml   Filed Weights   11/26/19 2132 11/27/19 2135 12/02/19 2018  Weight: 72.5 kg 72.5 kg 72 kg    Examination: General exam: Appears comfortable  HEENT: PERRLA, oral mucosa moist, no sclera icterus or thrush Respiratory system: Clear to auscultation. Respiratory effort normal. Cardiovascular system: S1 & S2 heard,  No murmurs  Gastrointestinal system: Abdomen soft, non-tender, nondistended. Normal bowel sounds   Central nervous system: Alert and oriented. No focal neurological deficits. Extremities: No cyanosis, clubbing or edema Skin: No rashes or ulcers Psychiatry:  Mood & affect appropriate.  Data Reviewed: I have personally reviewed following labs and imaging studies  CBC: Recent Labs  Lab 11/27/19 2345  WBC 9.0  NEUTROABS 4.4  HGB 15.1  HCT 44.8  MCV 90.3  PLT 217   Basic Metabolic Panel: Recent Labs  Lab 11/27/19 2345 11/28/19 0408  NA 136 141  K 4.3 4.1  CL 104 107  CO2 24 26  GLUCOSE 104* 101*  BUN 26* 24*  CREATININE 1.39* 1.50*  CALCIUM 9.0 9.3   GFR: Estimated Creatinine Clearance: 57 mL/min (A) (by C-G formula based on SCr of 1.5 mg/dL (H)). Liver Function Tests: No results for input(s): AST, ALT, ALKPHOS, BILITOT, PROT, ALBUMIN in the last 168 hours. No results for input(s): LIPASE, AMYLASE in the last 168 hours. No results for input(s): AMMONIA in the last 168 hours. Coagulation Profile: No results for input(s): INR, PROTIME in the last 168 hours. Cardiac Enzymes: No results for input(s): CKTOTAL, CKMB, CKMBINDEX, TROPONINI in the last 168 hours. BNP (last 3 results) No results for input(s): PROBNP in the last 8760 hours. HbA1C: No results for input(s): HGBA1C in the last 72 hours. CBG: No results for input(s): GLUCAP in the last 168 hours. Lipid Profile: No results for input(s): CHOL, HDL, LDLCALC, TRIG, CHOLHDL, LDLDIRECT in the last 72 hours. Thyroid Function Tests: No results for input(s): TSH, T4TOTAL, FREET4,  T3FREE, THYROIDAB in the last 72 hours. Anemia Panel: No results for input(s): VITAMINB12, FOLATE, FERRITIN, TIBC, IRON, RETICCTPCT in the last 72 hours. Urine analysis:    Component Value Date/Time   COLORURINE AMBER (A) 07/23/2019 2214   APPEARANCEUR CLOUDY (A) 07/23/2019 2214   LABSPEC 1.012 07/23/2019 2214   PHURINE 5.0 07/23/2019 2214   GLUCOSEU 50 (A) 07/23/2019 2214   HGBUR LARGE (A) 07/23/2019 2214   BILIRUBINUR NEGATIVE 07/23/2019 2214   KETONESUR NEGATIVE 07/23/2019 2214   PROTEINUR 30 (A) 07/23/2019 2214   NITRITE NEGATIVE 07/23/2019 2214   LEUKOCYTESUR SMALL (A) 07/23/2019 2214   Sepsis Labs: @LABRCNTIP (procalcitonin:4,lacticidven:4) )No results found for this or any previous visit (from the past 240 hour(s)).       Radiology Studies: No results found.    Scheduled Meds: . aspirin  325 mg Oral Daily  . atorvastatin  20 mg Oral q1800  . enoxaparin (LOVENOX) injection  40 mg Subcutaneous Q24H  . feeding supplement (ENSURE ENLIVE)  237 mL Oral BID BM  . folic acid  1 mg Oral Daily  . lactulose  20  g Oral BID  . metoprolol tartrate  50 mg Oral BID  . multivitamin with minerals  1 tablet Oral Daily  . omega-3 acid ethyl esters  1 g Oral Daily  . pantoprazole  40 mg Oral Daily  . thiamine  100 mg Oral Daily   Or  . thiamine  100 mg Intravenous Daily   Continuous Infusions:   LOS: 133 days      Debbe Odea, MD [facility] Pager: [email or web address] Password [email or web address] 12/03/2019, 1:38 PM

## 2019-12-04 NOTE — Progress Notes (Signed)
PROGRESS NOTE    Tyler [name]   [id number]  DOB: 05/27/1971  DOA: 07/23/2019 PCP: Patient, No Pcp Per   Brief Narrative:  Tyler [name] is a48 y.o.malewith a history of polysubstance abuse who presented to [facility] ER after being found down with a syringe nearby. He was given Narcan with minimal improvement. On presentation, he was found to have severe rhabdomyolysis with acute kidney injury and hyperkalemia. He was started on IV fluids including bicarb drip and transferred to [facility] for further care. Rhabdomyolysis improved but he had persistence of encephalopathy and was found to have acute bilateral cerebellar infarct. Neurology was consulted and has subsequently signed off. PT recommended SNF placement. Prolonged hospitalization due to placement issues.   Patient is medically stable for discharge. Barriers to discharge currently are patient's cognitive status secondary to his issues as below as well as lack of ability to gain guardianship at this point as he does not have any family members willing to take care of him and patient is a difficult placement at SNF or ALF.  Case management attempting to reach DSS for guardianship issues.     Subjective: No complaints.     Assessment & Plan:   Principal Problem:   Acute b/l ischemic infarcts - has new cognitie deficits and is no longer able to live alone - needs 30 day event monitor as outpt when discharged, per neuro - also per neuro, cont ASA and Statin - Carotid duplex unremarkable - ECHO showed not thrombus and EF was 60-65% - A1c 5.1%  Active Problems:   Rhabdomyolysis with AKI - has resolved - renal ultrasound was negative     Acute metabolic encephalopathy -initially required restrains but has improved - awake alert and very oriented    Polysubstance abuse  - alcohol and drug use   Essential HTN - cont Metoprolol  Severe protein calorie malnutrition - cont dietary supplements  Back pain  - he has T11 and L1 fractures noted on xray - pain resolved  UTI  treated with Ceftriaxone  Severe generalized weakness - PT/OT recommends SNF   Time spent in minutes: 35  DVT prophylaxis: Lovenox Code Status: Full code Family Communication:  Disposition Plan: ALF vs SNF- awaiting a bed Consultants:   Neurology  psych Procedures:  Echocardiogram 1. The left ventricle has normal systolic function with an ejection fraction of 60-65%. The cavity size was normal. Left ventricular diastolic Doppler parameters are consistent with impaired relaxation. No evidence of left ventricular regional wall motion abnormalities. 2. The right ventricle has normal systolic function. The cavity was normal. There is no increase in right ventricular wall thickness. Right ventricular systolic pressure is normal with an estimated pressure of 21.0 mmHg. 3. The aortic valve is tricuspid. Moderate sclerosis of the aortic valve. 4. The aorta is normal unless otherwise noted.  EEG This study issuggestive of moderate diffuse encephalopathy which is nonspecific to etiology. No seizures or epileptiform discharges were seen throughout the recording.  Antimicrobials:  Anti-infectives (From admission, onward)   Start     Dose/Rate Route Frequency Ordered Stop   07/25/19 0745  cefTRIAXone (ROCEPHIN) 1 g in sodium chloride 0.9 % 100 mL IVPB     1 g 200 mL/hr over 30 Minutes Intravenous Every 24 hours 07/25/19 0734 07/29/19 1518       Objective: Vitals:   12/03/19 1640 12/03/19 2157 12/04/19 0553 12/04/19 0915  BP: (!) 130/109 102/67 98/76 112/77  Pulse: 60 66 61 68  Resp: 16 18 16  18  Temp: 98.3 F (36.8 C) 98.1 F (36.7 C) 98.1 F (36.7 C) 98 F (36.7 C)  TempSrc: Oral Oral Oral Oral  SpO2: 99% 97% 96% 100%  Weight:      Height:        Intake/Output Summary (Last 24 hours) at 12/04/2019 1433 Last data filed at 12/04/2019 0900 Gross per 24 hour  Intake 610 ml  Output -  Net 610 ml    Filed Weights   11/26/19 2132 11/27/19 2135 12/02/19 2018  Weight: 72.5 kg 72.5 kg 72 kg    Examination: General exam: Appears comfortable  HEENT: PERRLA, oral mucosa moist, no sclera icterus or thrush Respiratory system: Clear to auscultation. Respiratory effort normal. Cardiovascular system: S1 & S2 heard,  No murmurs  Gastrointestinal system: Abdomen soft, non-tender, nondistended. Normal bowel sounds   Central nervous system: Alert and oriented. No focal neurological deficits. Extremities: No cyanosis, clubbing or edema Skin: No rashes or ulcers Psychiatry:  Mood & affect appropriate.  Data Reviewed: I have personally reviewed following labs and imaging studies  CBC: Recent Labs  Lab 11/27/19 2345  WBC 9.0  NEUTROABS 4.4  HGB 15.1  HCT 44.8  MCV 90.3  PLT 175   Basic Metabolic Panel: Recent Labs  Lab 11/27/19 2345 11/28/19 0408  NA 136 141  K 4.3 4.1  CL 104 107  CO2 24 26  GLUCOSE 104* 101*  BUN 26* 24*  CREATININE 1.39* 1.50*  CALCIUM 9.0 9.3   GFR: Estimated Creatinine Clearance: 57 mL/min (A) (by C-G formula based on SCr of 1.5 mg/dL (H)). Liver Function Tests: No results for input(s): AST, ALT, ALKPHOS, BILITOT, PROT, ALBUMIN in the last 168 hours. No results for input(s): LIPASE, AMYLASE in the last 168 hours. No results for input(s): AMMONIA in the last 168 hours. Coagulation Profile: No results for input(s): INR, PROTIME in the last 168 hours. Cardiac Enzymes: No results for input(s): CKTOTAL, CKMB, CKMBINDEX, TROPONINI in the last 168 hours. BNP (last 3 results) No results for input(s): PROBNP in the last 8760 hours. HbA1C: No results for input(s): HGBA1C in the last 72 hours. CBG: No results for input(s): GLUCAP in the last 168 hours. Lipid Profile: No results for input(s): CHOL, HDL, LDLCALC, TRIG, CHOLHDL, LDLDIRECT in the last 72 hours. Thyroid Function Tests: No results for input(s): TSH, T4TOTAL, FREET4, T3FREE, THYROIDAB in the last 72  hours. Anemia Panel: No results for input(s): VITAMINB12, FOLATE, FERRITIN, TIBC, IRON, RETICCTPCT in the last 72 hours. Urine analysis:    Component Value Date/Time   COLORURINE AMBER (A) 07/23/2019 2214   APPEARANCEUR CLOUDY (A) 07/23/2019 2214   LABSPEC 1.012 07/23/2019 2214   PHURINE 5.0 07/23/2019 2214   GLUCOSEU 50 (A) 07/23/2019 2214   HGBUR LARGE (A) 07/23/2019 2214   BILIRUBINUR NEGATIVE 07/23/2019 2214   [place] 07/23/2019 2214   PROTEINUR 30 (A) 07/23/2019 2214   NITRITE NEGATIVE 07/23/2019 2214   LEUKOCYTESUR SMALL (A) 07/23/2019 2214   Sepsis Labs: @LABRCNTIP (procalcitonin:4,lacticidven:4) )No results found for this or any previous visit (from the past 240 hour(s)).       Radiology Studies: No results found.    Scheduled Meds: . aspirin  325 mg Oral Daily  . atorvastatin  20 mg Oral q1800  . enoxaparin (LOVENOX) injection  40 mg Subcutaneous Q24H  . feeding supplement (ENSURE ENLIVE)  237 mL Oral BID BM  . folic acid  1 mg Oral Daily  . lactulose  20 g Oral BID  . metoprolol tartrate  50 mg Oral BID  . multivitamin with minerals  1 tablet Oral Daily  . omega-3 acid ethyl esters  1 g Oral Daily  . pantoprazole  40 mg Oral Daily  . thiamine  100 mg Oral Daily   Or  . thiamine  100 mg Intravenous Daily   Continuous Infusions:   LOS: 134 days      Calvert Cantor, MD [facility] Pager: [email or web address] Password [facility] 12/04/2019, 2:33 PM

## 2019-12-04 NOTE — Progress Notes (Signed)
Occupational Therapy Treatment Patient Details Name: Tyler [name] MRN: [id number] DOB: May 26, 1971 Today's Date: 12/04/2019    History of present illness 48 y/o male transferred from [place] [facility] for unresponsiveness, likely secondary to substance abuse. Pt with rhabdomyolysis and AKI. EEG revealed moderate diffuse encephalopathy. PMH includes polysubstance abuse and HTN. Imaging show acute bialteral cerebellar infarcts.    OT comments  Pt received sitting on edge of bed, appeared to already be frustrated and agitated. Pt demonstrated moments of agitation expressing frustration with remaining in the hospital and not being able to see his kids and verbalized thoughts of self-harm and harm to others, RN alerted, these statements were followed by periods of calm behavior and pt stating "I am not suicidal" and "if I was going to harm others I would have already done it". Pt continues to demonstrate cognitive limitations impacting safety with ADL/IADL. He continues to require vc for sequencing during ADL completion due to limitations with memory, executive functioning, and problem solving. Pt with poor insight to his deficits. Pt will continue to benefit from skilled OT services to maximize safety and independence with ADL/IADL and functional mobility. Will continue to follow acutely and progress as tolerated.     Follow Up Recommendations  SNF;Supervision/Assistance - 24 hour    Equipment Recommendations  None recommended by OT    Recommendations for Other Services Other (comment)(Psych consult)    Precautions / Restrictions Precautions Precautions: None       Mobility Bed Mobility Overal bed mobility: Independent                Transfers Overall transfer level: Independent                    Balance Overall balance assessment: Independent                                         ADL either performed or assessed with clinical judgement   ADL  Overall ADL's : Needs assistance/impaired                                       General ADL Comments: session focused on identifying activities of leisure for pt to engage in while in the hospital;pt able to complete ADL with no physical assistance, but moderate assistance for sequencing due to cognitive limitations     Vision       Perception     Praxis      Cognition Arousal/Alertness: Awake/alert Behavior During Therapy: Agitated;Anxious;Restless Overall Cognitive Status: No family/caregiver present to determine baseline cognitive functioning Area of Impairment: Attention;Memory;Following commands;Safety/judgement;Awareness;Problem solving                   Current Attention Level: Sustained Memory: Decreased short-term memory Following Commands: Follows multi-step commands with increased time;Follows multi-step commands inconsistently;Follows one step commands consistently Safety/Judgement: Decreased awareness of deficits Awareness: Emergent Problem Solving: Decreased initiation;Difficulty sequencing;Requires verbal cues;Slow processing;Requires tactile cues General Comments: continues to demonstrate difficulty with trail making task despite demonstration by therapist;pt with poor insights to deficits, reports he is at baseline cognition, no family or caregiver present to determine baseline. Pt agitated and restless during session, he was pacing and verbalizing his frustration with having to be in hospital; When therapist asked pt what else he thinks to do to  fill his time he replied "kill myself", RN notified. During his rants pt also mentioned "harming others", RN aware of this too. He continued on to state "Im not serious about doing it, if I was I would have already done it" and "I don't like to be mad or argue, I am a chill person". Pt demonstrated moments of extrememe frustration followed by neutral state, he was able to be redirected by therapist when  frustrated. 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         Exercises     Shoulder Instructions       General Comments      Pertinent Vitals/ Pain       Pain Assessment: No/denies pain  Home Living                                          Prior Functioning/Environment              Frequency  Min 1X/week        Progress Toward Goals  OT Goals(current goals can now be found in the care plan section)  Progress towards OT goals: Progressing toward goals  Acute Rehab OT Goals Patient Stated Goal: to go home and see kids OT Goal Formulation: With patient Time For Goal Achievement: 12/10/19 Potential to Achieve Goals: Good ADL Goals Additional ADL Goal #1: Pt will brush his teeth daily with min cues to establish a daily routine. Additional ADL Goal #2: Pt will complete trail making task with <3 VC's for safe and successful completion Additional ADL Goal #3: Pt will complete 3 grooming tasks with min cues.  Plan Discharge plan remains appropriate    Co-evaluation                 AM-PAC OT "6 Clicks" Daily Activity     Outcome Measure   Help from another person eating meals?: None Help from another person taking care of personal grooming?: A Little Help from another person toileting, which includes using toliet, bedpan, or urinal?: None Help from another person bathing (including washing, rinsing, drying)?: A Little Help from another person to put on and taking off regular upper body clothing?: None Help from another person to put on and taking off regular lower body clothing?: None 6 Click Score: 22    End of Session    OT Visit Diagnosis: Other symptoms and signs involving cognitive function   Activity Tolerance Patient tolerated treatment well   Patient Left in bed;with call bell/phone  within reach   Nurse Communication Mobility status(Pt's agitation and comments about self-harm and harming othe)        Time: 4481-8563 OT Time Calculation (min): 33 min  Charges: OT General Charges $OT Visit: 1 Visit OT Treatments $Self Care/Home Management : 8-22 mins $Cognitive Funtion inital: Initial 15 mins  Dorinda Hill OTR/L Acute Rehabilitation Services Office: [place] 12/04/2019, 4:41 PM

## 2019-12-04 NOTE — TOC Progression Note (Signed)
Transition of Care [facility]) - Progression Note    Patient Details  Name: Breyson Kelm MRN: [id number] Date of Birth: Dec 27, 1970  Transition of Care [facility]) CM/SW Contact  Sharlet Salina Mila Homer, LCSW Phone Number: 12/04/2019, 10:57 AM  Clinical Narrative:  Call made to Sharee Pimple, Development worker, international aid at [facility] at [facility] in [place], [place] [phone]) to follow-up regarding patient. Clinicals were faxed 12/22. Sharee Pimple confirmed that clinicals were received and she will f/u with Aldona Bar, care coordinator regarding patient..     Expected Discharge Plan: Skilled Nursing Facility Barriers to Discharge: Active Substance Use - Placement, SNF Pending payor source - LOG, SNF Pending bed offer  Expected Discharge Plan and Services Expected Discharge Plan: [place]   Discharge Planning Services: CM Consult, Other - See comment(Pt will need LOG for SNF placement) Post Acute Care Choice: [place] arrangements for the past 2 months: Single Family Home                 DME Arranged: N/A           HH Agency: NA       Social Determinants of Health (SDOH) Interventions  Patient in need of placement: ALF or family care home.    Readmission Risk Interventions No flowsheet data found.

## 2019-12-05 LAB — BASIC METABOLIC PANEL
Anion gap: 13 (ref 5–15)
BUN: 23 mg/dL — ABNORMAL HIGH (ref 6–20)
CO2: 19 mmol/L — ABNORMAL LOW (ref 22–32)
Calcium: 9.1 mg/dL (ref 8.9–10.3)
Chloride: 107 mmol/L (ref 98–111)
Creatinine, Ser: 1.3 mg/dL — ABNORMAL HIGH (ref 0.61–1.24)
GFR calc Af Amer: >60 mL/min (ref >60)
GFR calc non Af Amer: >60 mL/min (ref >60)
Glucose, Bld: 107 mg/dL — ABNORMAL HIGH (ref 70–99)
Potassium: 3.8 mmol/L (ref 3.5–5.1)
Sodium: 139 mmol/L (ref 135–145)

## 2019-12-05 LAB — CBC WITH DIFFERENTIAL/PLATELET
Abs Immature Granulocytes: 0.03 10*3/uL (ref 0.00–0.07)
Basophils Absolute: 0.1 10*3/uL (ref 0.0–0.1)
Basophils Relative: 1 %
Eosinophils Absolute: 0.7 10*3/uL — ABNORMAL HIGH (ref 0.0–0.5)
Eosinophils Relative: 7 %
HCT: 44.8 % (ref 39.0–52.0)
Hemoglobin: 15.2 g/dL (ref 13.0–17.0)
Immature Granulocytes: 0 %
Lymphocytes Relative: 32 %
Lymphs Abs: 3.4 10*3/uL (ref 0.7–4.0)
MCH: 30.5 pg (ref 26.0–34.0)
MCHC: 33.9 g/dL (ref 30.0–36.0)
MCV: 89.8 fL (ref 80.0–100.0)
Monocytes Absolute: 1.3 10*3/uL — ABNORMAL HIGH (ref 0.1–1.0)
Monocytes Relative: 12 %
Neutro Abs: 5.2 10*3/uL (ref 1.7–7.7)
Neutrophils Relative %: 48 %
Platelets: 233 10*3/uL (ref 150–400)
RBC: 4.99 MIL/uL (ref 4.22–5.81)
RDW: 11.9 % (ref 11.5–15.5)
WBC: 10.6 10*3/uL — ABNORMAL HIGH (ref 4.0–10.5)
nRBC: 0 % (ref 0.0–0.2)

## 2019-12-05 NOTE — Progress Notes (Signed)
PROGRESS NOTE    Tyler [name]   NWG:[id number]  DOB: 12/03/1971  DOA: 07/23/2019 PCP: Patient, No Pcp Per   Brief Narrative:  Tyler [name] is a48 y.o.malewith a history of polysubstance abuse who [facility] to [facility] after being found down with a syringe nearby. He was given Narcan with minimal improvement. On presentation, he was found to have severe rhabdomyolysis with acute kidney injury and hyperkalemia. He was started on IV fluids including bicarb drip and transferred to [facility] for further care. Rhabdomyolysis improved but he had persistence of encephalopathy and was found to have acute bilateral cerebellar infarct. Neurology was consulted and has subsequently signed off. PT recommended SNF placement. Prolonged hospitalization due to placement issues.   Patient is medically stable for discharge. Barriers to discharge currently are patient's cognitive status secondary to his issues as below as well as lack of ability to gain guardianship at this point as he does not have any family members willing to take care of him and patient is a difficult placement at SNF or ALF.  Case management attempting to reach DSS for guardianship issues.     Subjective: No complaints.     Assessment & Plan:   Principal Problem:   Acute b/l ischemic infarcts - has new cognitive deficits and is no longer able to live alone - needs 30 day event monitor as outpt when discharged, per neuro - also per neuro, cont ASA and Statin - Carotid duplex unremarkable - ECHO showed not thrombus and EF was 60-65% - A1c 5.1%  Active Problems:   Rhabdomyolysis with AKI - has resolved - renal ultrasound was negative     Acute metabolic encephalopathy -initially required restraints but has improved - awake alert and very oriented    Polysubstance abuse  - alcohol and drug use   Essential HTN - cont Metoprolol  Severe protein calorie malnutrition - cont dietary supplements  Back  pain - he has T11 and L1 fractures noted on xray - pain resolved  UTI  treated with Ceftriaxone  Severe generalized weakness - PT/OT recommends SNF   Time spent in minutes: 15  DVT prophylaxis: Lovenox Code Status: Full code Family Communication:  Disposition Plan: ALF vs SNF- awaiting a bed-suspect difficult placement Consultants:   Neurology  psych Procedures:  Echocardiogram 1. The left ventricle has normal systolic function with an ejection fraction of 60-65%. The cavity size was normal. Left ventricular diastolic Doppler parameters are consistent with impaired relaxation. No evidence of left ventricular regional wall motion abnormalities. 2. The right ventricle has normal systolic function. The cavity was normal. There is no increase in right ventricular wall thickness. Right ventricular systolic pressure is normal with an estimated pressure of 21.0 mmHg. 3. The aortic valve is tricuspid. Moderate sclerosis of the aortic valve. 4. The aorta is normal unless otherwise noted.  EEG This study issuggestive of moderate diffuse encephalopathy which is nonspecific to etiology. No seizures or epileptiform discharges were seen throughout the recording.  Antimicrobials:  Anti-infectives (From admission, onward)   Start     Dose/Rate Route Frequency Ordered Stop   07/25/19 0745  cefTRIAXone (ROCEPHIN) 1 g in sodium chloride 0.9 % 100 mL IVPB     1 g 200 mL/hr over 30 Minutes Intravenous Every 24 hours 07/25/19 0734 07/29/19 1518       Objective: Vitals:   12/04/19 0915 12/04/19 1813 12/04/19 2100 12/05/19 0606  BP: 112/77 115/74 112/78 101/76  Pulse: 68 69 73 (!) 58  Resp: 18 20  20 18  Temp: 98 F (36.7 C) 98.2 F (36.8 C) 98.4 F (36.9 C) 97.6 F (36.4 C)  TempSrc: Oral Oral Oral Oral  SpO2: 100% 98% 99% 99%  Weight:      Height:        Intake/Output Summary (Last 24 hours) at 12/05/2019 1339 Last data filed at 12/05/2019 0945 Gross per 24 hour  Intake 900  ml  Output -  Net 900 ml   Filed Weights   11/26/19 2132 11/27/19 2135 12/02/19 2018  Weight: 72.5 kg 72.5 kg 72 kg    Examination: Coherent pleasant awake follows commands soft supple neck no thyromegaly no lymphadenopathy Chest clear no added sound no rales or rhonchi S1-S2 no murmur rub or gallop Abdomen soft no rebound no guarding No lower extremity edema power 5/5  Data Reviewed: I have personally reviewed following labs and imaging studies  CBC: Recent Labs  Lab 12/05/19 0020  WBC 10.6*  NEUTROABS 5.2  HGB 15.2  HCT 44.8  MCV 89.8  PLT 233   Basic Metabolic Panel: Recent Labs  Lab 12/05/19 0020  NA 139  K 3.8  CL 107  CO2 19*  GLUCOSE 107*  BUN 23*  CREATININE 1.30*  CALCIUM 9.1   GFR: Estimated Creatinine Clearance: 65.8 mL/min (A) (by C-G formula based on SCr of 1.3 mg/dL (H)). Liver Function Tests: No results for input(s): AST, ALT, ALKPHOS, BILITOT, PROT, ALBUMIN in the last 168 hours. No results for input(s): LIPASE, AMYLASE in the last 168 hours. No results for input(s): AMMONIA in the last 168 hours. Coagulation Profile: No results for input(s): INR, PROTIME in the last 168 hours. Cardiac Enzymes: No results for input(s): CKTOTAL, CKMB, CKMBINDEX, TROPONINI in the last 168 hours. BNP (last 3 results) No results for input(s): PROBNP in the last 8760 hours. HbA1C: No results for input(s): HGBA1C in the last 72 hours. CBG: No results for input(s): GLUCAP in the last 168 hours. Lipid Profile: No results for input(s): CHOL, HDL, LDLCALC, TRIG, CHOLHDL, LDLDIRECT in the last 72 hours. Thyroid Function Tests: No results for input(s): TSH, T4TOTAL, FREET4, T3FREE, THYROIDAB in the last 72 hours. Anemia Panel: No results for input(s): VITAMINB12, FOLATE, FERRITIN, TIBC, IRON, RETICCTPCT in the last 72 hours. Urine analysis:    Component Value Date/Time   COLORURINE AMBER (A) 07/23/2019 2214   APPEARANCEUR CLOUDY (A) 07/23/2019 2214   LABSPEC  1.012 07/23/2019 2214   PHURINE 5.0 07/23/2019 2214   GLUCOSEU 50 (A) 07/23/2019 2214   HGBUR LARGE (A) 07/23/2019 2214   BILIRUBINUR NEGATIVE 07/23/2019 2214   KETONESUR NEGATIVE 07/23/2019 2214   PROTEINUR 30 (A) 07/23/2019 2214   NITRITE NEGATIVE 07/23/2019 2214   LEUKOCYTESUR SMALL (A) 07/23/2019 2214   Sepsis Labs: @LABRCNTIP (procalcitonin:4,lacticidven:4) )No results found for this or any previous visit (from the past 240 hour(s)).       Radiology Studies: No results found.    Scheduled Meds: . aspirin  325 mg Oral Daily  . atorvastatin  20 mg Oral q1800  . enoxaparin (LOVENOX) injection  40 mg Subcutaneous Q24H  . feeding supplement (ENSURE ENLIVE)  237 mL Oral BID BM  . folic acid  1 mg Oral Daily  . lactulose  20 g Oral BID  . metoprolol tartrate  50 mg Oral BID  . multivitamin with minerals  1 tablet Oral Daily  . omega-3 acid ethyl esters  1 g Oral Daily  . pantoprazole  40 mg Oral Daily  . thiamine  100 mg  Oral Daily   Or  . thiamine  100 mg Intravenous Daily   Continuous Infusions:   LOS: 135 days      Nita Sells, MD [facility] Pager: [email or web address] Password [email or web address] 12/05/2019, 1:39 PM

## 2019-12-05 NOTE — TOC Progression Note (Addendum)
Transition of Care [facility]) - Progression Note    Patient Details  Name: Tyler [name] MRN: [id number] Date of Birth: 21-Dec-1970  Transition of Care [facility]) CM/SW Contact  Sharlet Salina Mila Homer, LCSW Phone Number: 12/05/2019, 2:16 PM  Clinical Narrative:  Call made to [facility] [phone]) and talked with [facility]. CSW advised that they are not currently accepting new admissions due to [place]. There age cut-off is 14, however they will make exceptions at time. CSW was transferred by Stanton Kidney to another facility - [facility] in [place] ([phone]) and talked with Cleo. Was informed that they do not accept person younger than 16, however they do have male and male beds available.    Expected Discharge Plan: Skilled Nursing Facility Barriers to Discharge: Active Substance Use - Placement, SNF Pending payor source - LOG, SNF Pending bed offer  Expected Discharge Plan and Services Expected Discharge Plan: [place]   Discharge Planning Services: CM Consult, Other - See comment(Pt will need LOG for SNF placement) Post Acute Care Choice: [place] arrangements for the past 2 months: Single Family Home                 DME Arranged: N/A           HH Agency: NA         Social Determinants of Health (SDOH) Interventions  Patient in need of housing: ALF or [place].    Readmission Risk Interventions No flowsheet data found.

## 2019-12-06 NOTE — Progress Notes (Signed)
PROGRESS NOTE    Tyler [name] Amadi   JXB:[id number]  DOB: 11/17/1971  DOA: 07/23/2019 PCP: Patient, No Pcp Per   Brief Narrative:  Tyler [name] Mangels is a48 y.o.malewith a history of polysubstance abuse who [facility] after being found down with a syringe nearby. He was given Narcan with minimal improvement. On presentation, he was found to have severe rhabdomyolysis with acute kidney injury and hyperkalemia. He was started on IV fluids including bicarb drip and transferred to [facility] for further care. Rhabdomyolysis improved but he had persistence of encephalopathy and was found to have acute bilateral cerebellar infarct. Neurology was consulted and has subsequently signed off. PT recommended SNF placement. Prolonged hospitalization due to placement issues.   Patient is medically stable for discharge. Barriers to discharge currently are patient's cognitive status secondary to his issues as below as well as lack of ability to gain guardianship at this point as he does not have any family members willing to take care of him and patient is a difficult placement at SNF or ALF.  Case management attempting to reach DSS for guardianship issues.     Subjective: No new issues about the same as prior    Assessment & Plan:   Principal Problem:   Acute b/l ischemic infarcts - has new cognitive deficits and is no longer able to live alone - needs 30 day event monitor as outpt when discharged, per neuro - also per neuro, cont ASA and Statin - Carotid duplex unremarkable - ECHO showed not thrombus and EF was 60-65% - A1c 5.1%  Active Problems:   Rhabdomyolysis with AKI - has resolved - renal ultrasound was negative     Acute metabolic encephalopathy -initially required restraints but has improved - awake alert and very oriented    Polysubstance abuse  - alcohol and drug use   Essential HTN - cont Metoprolol  Severe protein calorie malnutrition - cont dietary  supplements  Back pain - he has T11 and L1 fractures noted on xray - pain resolved  UTI  treated with Ceftriaxone  Severe generalized weakness - PT/OT recommends SNF   Time spent in minutes: 15  DVT prophylaxis: Lovenox Code Status: Full code Family Communication:  Disposition Plan: ALF vs SNF- awaiting a bed-suspect difficult placement Consultants:   Neurology  psych Procedures:  Echocardiogram 1. The left ventricle has normal systolic function with an ejection fraction of 60-65%. The cavity size was normal. Left ventricular diastolic Doppler parameters are consistent with impaired relaxation. No evidence of left ventricular regional wall motion abnormalities. 2. The right ventricle has normal systolic function. The cavity was normal. There is no increase in right ventricular wall thickness. Right ventricular systolic pressure is normal with an estimated pressure of 21.0 mmHg. 3. The aortic valve is tricuspid. Moderate sclerosis of the aortic valve. 4. The aorta is normal unless otherwise noted.  EEG This study issuggestive of moderate diffuse encephalopathy which is nonspecific to etiology. No seizures or epileptiform discharges were seen throughout the recording.  Antimicrobials:  Anti-infectives (From admission, onward)   Start     Dose/Rate Route Frequency Ordered Stop   07/25/19 0745  cefTRIAXone (ROCEPHIN) 1 g in sodium chloride 0.9 % 100 mL IVPB     1 g 200 mL/hr over 30 Minutes Intravenous Every 24 hours 07/25/19 0734 07/29/19 1518       Objective: Vitals:   12/05/19 1808 12/05/19 2044 12/06/19 0617 12/06/19 0858  BP: 113/78 113/74 99/64 119/78  Pulse: (!) 57 65 Marland Kitchen(!)  53 60  Resp: 20 16 18 18   Temp: 97.8 F (36.6 C) 98.2 F (36.8 C) 98.2 F (36.8 C) 98.6 F (37 C)  TempSrc: Oral Oral Oral Oral  SpO2: 100% 97% 96% 97%  Weight:  71.8 kg    Height:        Intake/Output Summary (Last 24 hours) at 12/06/2019 1055 Last data filed at 12/06/2019  0900 Gross per 24 hour  Intake 980 ml  Output 0 ml  Net 980 ml   Filed Weights   11/27/19 2135 12/02/19 2018 12/05/19 2044  Weight: 72.5 kg 72 kg 71.8 kg    Examination: Coherent pleasant awake follows commands meck soft Chest clear  S1-S2 no murmur rub or gallop Abdomen soft no rebound no guarding  Data Reviewed: I have personally reviewed following labs and imaging studies  CBC: Recent Labs  Lab 12/05/19 0020  WBC 10.6*  NEUTROABS 5.2  HGB 15.2  HCT 44.8  MCV 89.8  PLT 338   Basic Metabolic Panel: Recent Labs  Lab 12/05/19 0020  NA 139  K 3.8  CL 107  CO2 19*  GLUCOSE 107*  BUN 23*  CREATININE 1.30*  CALCIUM 9.1   GFR: Estimated Creatinine Clearance: 65.8 mL/min (A) (by C-G formula based on SCr of 1.3 mg/dL (H)). Liver Function Tests: No results for input(s): AST, ALT, ALKPHOS, BILITOT, PROT, ALBUMIN in the last 168 hours. No results for input(s): LIPASE, AMYLASE in the last 168 hours. No results for input(s): AMMONIA in the last 168 hours. Coagulation Profile: No results for input(s): INR, PROTIME in the last 168 hours. Cardiac Enzymes: No results for input(s): CKTOTAL, CKMB, CKMBINDEX, TROPONINI in the last 168 hours. BNP (last 3 results) No results for input(s): PROBNP in the last 8760 hours. HbA1C: No results for input(s): HGBA1C in the last 72 hours. CBG: No results for input(s): GLUCAP in the last 168 hours. Lipid Profile: No results for input(s): CHOL, HDL, LDLCALC, TRIG, CHOLHDL, LDLDIRECT in the last 72 hours. Thyroid Function Tests: No results for input(s): TSH, T4TOTAL, FREET4, T3FREE, THYROIDAB in the last 72 hours. Anemia Panel: No results for input(s): VITAMINB12, FOLATE, FERRITIN, TIBC, IRON, RETICCTPCT in the last 72 hours. Urine analysis:    Component Value Date/Time   COLORURINE AMBER (A) 07/23/2019 2214   APPEARANCEUR CLOUDY (A) 07/23/2019 2214   LABSPEC 1.012 07/23/2019 2214   PHURINE 5.0 07/23/2019 2214   GLUCOSEU 50 (A)  07/23/2019 2214   HGBUR LARGE (A) 07/23/2019 2214   BILIRUBINUR NEGATIVE 07/23/2019 2214   [place] 07/23/2019 2214   PROTEINUR 30 (A) 07/23/2019 2214   NITRITE NEGATIVE 07/23/2019 2214   LEUKOCYTESUR SMALL (A) 07/23/2019 2214   Sepsis Labs: @LABRCNTIP (procalcitonin:4,lacticidven:4) )No results found for this or any previous visit (from the past 240 hour(s)).       Radiology Studies: No results found.    Scheduled Meds: . aspirin  325 mg Oral Daily  . atorvastatin  20 mg Oral q1800  . enoxaparin (LOVENOX) injection  40 mg Subcutaneous Q24H  . feeding supplement (ENSURE ENLIVE)  237 mL Oral BID BM  . folic acid  1 mg Oral Daily  . lactulose  20 g Oral BID  . metoprolol tartrate  50 mg Oral BID  . multivitamin with minerals  1 tablet Oral Daily  . omega-3 acid ethyl esters  1 g Oral Daily  . pantoprazole  40 mg Oral Daily  . thiamine  100 mg Oral Daily   Or  . thiamine  100 mg Intravenous Daily   Continuous Infusions:   LOS: 136 days      Rhetta Mura, MD [facility] Pager: [email or web address] Password [email or web address] 12/06/2019, 10:55 AM

## 2019-12-07 NOTE — Plan of Care (Signed)
  Problem: Health Behavior/Discharge Planning: Goal: Ability to manage health-related needs will improve Outcome: Progressing   

## 2019-12-07 NOTE — Progress Notes (Signed)
No changes patient stable no new issues Full note again in am  Pleas Koch, MD [facility] 2:35 PM

## 2019-12-08 DIAGNOSIS — I6310 Cerebral infarction due to embolism of unspecified precerebral artery: Secondary | ICD-10-CM

## 2019-12-08 NOTE — Progress Notes (Signed)
No changes to overall plan of care-currently stable-continue to monitor and await stability for discharge  Pleas Koch, MD [facility] 11:53 AM

## 2019-12-09 NOTE — Progress Notes (Signed)
PROGRESS NOTE    Tyler [name]   DXI:[id number]  DOB: 1971-04-02  DOA: 07/23/2019 PCP: Patient, No Pcp Per   Brief Narrative:  Tyler [name] is a48 y.o.malewith a history of polysubstance abuse who presented to [facility] ER after being found down with a syringe nearby. He was given Narcan with minimal improvement. On presentation, he was found to have severe rhabdomyolysis with acute kidney injury and hyperkalemia. He was started on IV fluids including bicarb drip and transferred to [facility] for further care. Rhabdomyolysis improved but he had persistence of encephalopathy and was found to have acute bilateral cerebellar infarct. Neurology was consulted and has subsequently signed off. PT recommended SNF placement. Prolonged hospitalization due to placement issues.   Patient is medically stable for discharge. Barriers to discharge currently are patient's cognitive status secondary to his issues as below as well as lack of ability to gain guardianship at this point as he does not have any family members willing to take care of him and patient is a difficult placement at SNF or ALF.  Case management attempting to reach DSS for guardianship issues.   Subjective:  No acute changes disgrutnles doesn't wish to cooperate today  Assessment & Plan:   Principal Problem:   Acute b/l ischemic infarcts - has new cognitive deficits and is no longer able to live alone--dispo is difficult given his needing placement - needs 30 day event monitor as outpt when discharged, per neuro - also per neuro, cont ASA and Statin - Carotid duplex unremarkable - ECHO showed not thrombus and EF was 60-65% - A1c 5.1%    Rhabdomyolysis with AKI - has resolved - renal ultrasound was negative     Acute metabolic encephalopathy -initially required restraints but has improved - awake [name] and very oriented    Polysubstance abuse  - alcohol and drug use   Essential HTN - cont  Metoprolol  Severe protein calorie malnutrition - cont dietary supplements  Back pain - he has T11 and L1 fractures noted on xray - pain resolved, he is ambulatory without issue  UTI  treated with Ceftriaxone  Severe generalized weakness - PT/OT recommends SNF   Time spent in minutes: 15  DVT prophylaxis: Lovenox Code Status: Full code Family Communication:  Disposition Plan: ALF vs SNF- awaiting a bed-suspect difficult placement Consultants:   Neurology  psych Procedures:  Echocardiogram 1. The left ventricle has normal systolic function with an ejection fraction of 60-65%. The cavity size was normal. Left ventricular diastolic Doppler parameters are consistent with impaired relaxation. No evidence of left ventricular regional wall motion abnormalities. 2. The right ventricle has normal systolic function. The cavity was normal. There is no increase in right ventricular wall thickness. Right ventricular systolic pressure is normal with an estimated pressure of 21.0 mmHg. 3. The aortic valve is tricuspid. Moderate sclerosis of the aortic valve. 4. The aorta is normal unless otherwise noted.  EEG This study issuggestive of moderate diffuse encephalopathy which is nonspecific to etiology. No seizures or epileptiform discharges were seen throughout the recording.  Antimicrobials:  Anti-infectives (From admission, onward)   Start     Dose/Rate Route Frequency Ordered Stop   07/25/19 0745  cefTRIAXone (ROCEPHIN) 1 g in sodium chloride 0.9 % 100 mL IVPB     1 g 200 mL/hr over 30 Minutes Intravenous Every 24 hours 07/25/19 0734 07/29/19 1518       Objective: Vitals:   12/08/19 0935 12/08/19 1708 12/08/19 2052 12/09/19 0435  BP: 105/68 110/71 Marland Kitchen)  146/92 101/89  Pulse: 66 68 63 60  Resp: 18 18 16 16   Temp: 98 F (36.7 C) 98.4 F (36.9 C) 98.1 F (36.7 C) 98 F (36.7 C)  TempSrc: Oral Oral Oral Oral  SpO2: 98% 99% 98% 97%  Weight:   72.2 kg   Height:         Intake/Output Summary (Last 24 hours) at 12/09/2019 1114 Last data filed at 12/09/2019 0900 Gross per 24 hour  Intake 1280 ml  Output 0 ml  Net 1280 ml   Filed Weights   12/05/19 2044 12/07/19 2119 12/08/19 2052  Weight: 71.8 kg 71.9 kg 72.2 kg    Examination: Coherent no distress eomi nca tno focal deficit s1 s 2 no mrg abd sfot nt  Data Reviewed: I have personally reviewed following labs and imaging studies  CBC: Recent Labs  Lab 12/05/19 0020  WBC 10.6*  NEUTROABS 5.2  HGB 15.2  HCT 44.8  MCV 89.8  PLT 233   Basic Metabolic Panel: Recent Labs  Lab 12/05/19 0020  NA 139  K 3.8  CL 107  CO2 19*  GLUCOSE 107*  BUN 23*  CREATININE 1.30*  CALCIUM 9.1   GFR: Estimated Creatinine Clearance: 65.8 mL/min (A) (by C-G formula based on SCr of 1.3 mg/dL (H)). Liver Function Tests: No results for input(s): AST, ALT, ALKPHOS, BILITOT, PROT, ALBUMIN in the last 168 hours. No results for input(s): LIPASE, AMYLASE in the last 168 hours. No results for input(s): AMMONIA in the last 168 hours. Coagulation Profile: No results for input(s): INR, PROTIME in the last 168 hours. Cardiac Enzymes: No results for input(s): CKTOTAL, CKMB, CKMBINDEX, TROPONINI in the last 168 hours. BNP (last 3 results) No results for input(s): PROBNP in the last 8760 hours. HbA1C: No results for input(s): HGBA1C in the last 72 hours. CBG: No results for input(s): GLUCAP in the last 168 hours. Lipid Profile: No results for input(s): CHOL, HDL, LDLCALC, TRIG, CHOLHDL, LDLDIRECT in the last 72 hours. Thyroid Function Tests: No results for input(s): TSH, T4TOTAL, FREET4, T3FREE, THYROIDAB in the last 72 hours. Anemia Panel: No results for input(s): VITAMINB12, FOLATE, FERRITIN, TIBC, IRON, RETICCTPCT in the last 72 hours. Urine analysis:    Component Value Date/Time   COLORURINE AMBER (A) 07/23/2019 2214   APPEARANCEUR CLOUDY (A) 07/23/2019 2214   LABSPEC 1.012 07/23/2019 2214   PHURINE 5.0  07/23/2019 2214   GLUCOSEU 50 (A) 07/23/2019 2214   HGBUR LARGE (A) 07/23/2019 2214   BILIRUBINUR NEGATIVE 07/23/2019 2214   KETONESUR NEGATIVE 07/23/2019 2214   PROTEINUR 30 (A) 07/23/2019 2214   NITRITE NEGATIVE 07/23/2019 2214   LEUKOCYTESUR SMALL (A) 07/23/2019 2214   Sepsis Labs: @LABRCNTIP (procalcitonin:4,lacticidven:4) )No results found for this or any previous visit (from the past 240 hour(s)).    Radiology Studies: No results found.  Scheduled Meds: . aspirin  325 mg Oral Daily  . atorvastatin  20 mg Oral q1800  . enoxaparin (LOVENOX) injection  40 mg Subcutaneous Q24H  . feeding supplement (ENSURE ENLIVE)  237 mL Oral BID BM  . folic acid  1 mg Oral Daily  . lactulose  20 g Oral BID  . metoprolol tartrate  50 mg Oral BID  . multivitamin with minerals  1 tablet Oral Daily  . omega-3 acid ethyl esters  1 g Oral Daily  . pantoprazole  40 mg Oral Daily  . thiamine  100 mg Oral Daily   Or  . thiamine  100 mg Intravenous Daily  Continuous Infusions:   LOS: 139 days   Rhetta Mura, MD [facility] Pager: [email or web address] Password [email or web address] 12/09/2019, 11:14 AM

## 2019-12-10 NOTE — Progress Notes (Signed)
Patient sleeping did not disturb  Pleas Koch, MD [facility] 11:43 AM

## 2019-12-10 NOTE — Progress Notes (Signed)
Nutrition Follow-up  DOCUMENTATION CODES:   Severe malnutrition in context of acute illness/injury  INTERVENTION:   RD to sign off   - Continue Ensure Enlive po BID, each supplement provides 350 kcal and 20 grams of protein - Continue Magic cup TID with meals, each supplement provides 290 kcal and 9 grams of protein - Continue MVI with minerals daily  NUTRITION DIAGNOSIS:   Severe Malnutrition related to acute illness (acute toxic/metabolic encephalopathy) as evidenced by moderate fat depletion, severe fat depletion, moderate muscle depletion, severe muscle depletion, energy intake < or equal to 50% for > or equal to 5 days, percent weight loss.  Shows weight gain/great appetite   GOAL:   Patient will meet greater than or equal to 90% of their needs  Meeting   MONITOR:   PO intake, Supplement acceptance, Labs, Weight trends, Skin, I & O's  REASON FOR ASSESSMENT:   LOS    ASSESSMENT:   Tyler [name] is a 49 y.o. male with medical history significant of polysubstance abuse including heroin.  Brought in to [facility] ED just before MN after being found down by friend last evening with nearby syringe.  Pt is medically stable for discharge, awaiting SNF placement.  Appetite remains stable. Eating 100% at each meal. Drinking Ensure 1-2 times daily. Does not wish to have snacks or different supplements. Will continue with Ensure as needed.   Pt with weight gain and great appetite. Skin intact. RD to sign off at this time. Please re-consult as needed.   Admission weight: 73 kg  Current weight: 72.3 kg    Medications: folic acid, lactulose, MVI with minerals, thimaine Labs: CBG 88-107  Diet Order:   Diet Order            DIET DYS 3 Room service appropriate? No; Fluid consistency: Thin  Diet effective now              EDUCATION NEEDS:   Not appropriate for education at this time  Skin:  Skin Assessment: Reviewed RN Assessment  Last BM:  1/3  Height:   Ht  Readings from Last 1 Encounters:  08/09/19 5\' 8"  (1.727 m)    Weight:   Wt Readings from Last 1 Encounters:  12/09/19 72.3 kg    Ideal Body Weight:  70 kg  BMI:  Body mass index is 24.24 kg/m.  Estimated Nutritional Needs:   Kcal:  2000-2200  Protein:  105-120 grams  Fluid:  > 2 L   02/06/20 RD, LDN Clinical Nutrition Pager # [phone]

## 2019-12-11 NOTE — Progress Notes (Signed)
PROGRESS NOTE    Tyler [name]   WUJ:[id number]  DOB: Dec 06, 1971  DOA: 07/23/2019 PCP: Patient, No Pcp Per   Brief Narrative:  Tyler [name] is a48 y.o.malewith a history of polysubstance abuse who presented to [facility] ER after being found down with a syringe nearby. He was given Narcan with minimal improvement. On presentation, he was found to have severe rhabdomyolysis with acute kidney injury and hyperkalemia. He was started on IV fluids including bicarb drip and transferred to [facility] for further care. Rhabdomyolysis improved but he had persistence of encephalopathy and was found to have acute bilateral cerebellar infarct. Neurology was consulted and has subsequently signed off. PT recommended SNF placement. Prolonged hospitalization due to placement issues.   Patient is medically stable for discharge. Barriers to discharge currently are patient's cognitive status secondary to his issues as below as well as lack of ability to gain guardianship at this point as he does not have any family members willing to take care of him and patient is a difficult placement at SNF or ALF.  Case management attempting to reach DSS for guardianship issues.   Subjective:  No acute changes Laying in bed today does not wish to take any medications according to nursing No acute changes and does not currently wish to interact today with  Assessment & Plan:   Principal Problem:   Acute b/l ischemic infarcts - has new cognitive deficits and is no longer able to live alone--dispo is difficult given his needing placement - needs 30 day event monitor as outpt when discharged, per neuro - also per neuro, cont ASA and Statin - Carotid duplex unremarkable - ECHO showed not thrombus and EF was 60-65% - A1c 5.1%    Rhabdomyolysis with AKI - has resolved - renal ultrasound was negative     Acute metabolic encephalopathy -initially required restraints but has improved - awake alert and  very oriented    Polysubstance abuse  - alcohol and drug use   Essential HTN - cont Metoprolol  Severe protein calorie malnutrition - cont dietary supplements  Back pain - he has T11 and L1 fractures noted on xray - pain resolved, he is ambulatory without issue  UTI  treated with Ceftriaxone  Severe generalized weakness - PT/OT recommends SNF   Time spent in minutes: 15  DVT prophylaxis: Lovenox Code Status: Full code Family Communication:  Disposition Plan: ALF vs SNF- awaiting a bed-suspect difficult placement Consultants:   Neurology  psych Procedures:  Echocardiogram 1. The left ventricle has normal systolic function with an ejection fraction of 60-65%. The cavity size was normal. Left ventricular diastolic Doppler parameters are consistent with impaired relaxation. No evidence of left ventricular regional wall motion abnormalities. 2. The right ventricle has normal systolic function. The cavity was normal. There is no increase in right ventricular wall thickness. Right ventricular systolic pressure is normal with an estimated pressure of 21.0 mmHg. 3. The aortic valve is tricuspid. Moderate sclerosis of the aortic valve. 4. The aorta is normal unless otherwise noted.  EEG This study issuggestive of moderate diffuse encephalopathy which is nonspecific to etiology. No seizures or epileptiform discharges were seen throughout the recording.  Antimicrobials:  Anti-infectives (From admission, onward)   Start     Dose/Rate Route Frequency Ordered Stop   07/25/19 0745  cefTRIAXone (ROCEPHIN) 1 g in sodium chloride 0.9 % 100 mL IVPB     1 g 200 mL/hr over 30 Minutes Intravenous Every 24 hours 07/25/19 0734 07/29/19 1518  Objective: Vitals:   12/10/19 0623 12/10/19 0855 12/10/19 2134 12/11/19 0613  BP: 129/79 127/78 (!) 157/111 107/77  Pulse: 70 74 (!) 56 62  Resp: 18 18 16 16   Temp: 98.3 F (36.8 C) 98 F (36.7 C) 97.8 F (36.6 C) 98 F (36.7 C)    TempSrc: Oral Oral Oral Oral  SpO2: 100% 98% 100% 97%  Weight:   71.6 kg   Height:        Intake/Output Summary (Last 24 hours) at 12/11/2019 1043 Last data filed at 12/11/2019 6222 Gross per 24 hour  Intake 640 ml  Output --  Net 640 ml   Filed Weights   12/08/19 2052 12/09/19 2116 12/10/19 2134  Weight: 72.2 kg 72.3 kg 71.6 kg    Examination: Coherent no distress laying on his right side Chest clear Abdomen soft No further evaluation was attempted  Data Reviewed: I have personally reviewed following labs and imaging studies  CBC: Recent Labs  Lab 12/05/19 0020  WBC 10.6*  NEUTROABS 5.2  HGB 15.2  HCT 44.8  MCV 89.8  PLT 979   Basic Metabolic Panel: Recent Labs  Lab 12/05/19 0020  NA 139  K 3.8  CL 107  CO2 19*  GLUCOSE 107*  BUN 23*  CREATININE 1.30*  CALCIUM 9.1   GFR: Estimated Creatinine Clearance: 65.8 mL/min (A) (by C-G formula based on SCr of 1.3 mg/dL (H)). Liver Function Tests: No results for input(s): AST, ALT, ALKPHOS, BILITOT, PROT, ALBUMIN in the last 168 hours. No results for input(s): LIPASE, AMYLASE in the last 168 hours. No results for input(s): AMMONIA in the last 168 hours. Coagulation Profile: No results for input(s): INR, PROTIME in the last 168 hours. Cardiac Enzymes: No results for input(s): CKTOTAL, CKMB, CKMBINDEX, TROPONINI in the last 168 hours. BNP (last 3 results) No results for input(s): PROBNP in the last 8760 hours. HbA1C: No results for input(s): HGBA1C in the last 72 hours. CBG: No results for input(s): GLUCAP in the last 168 hours. Lipid Profile: No results for input(s): CHOL, HDL, LDLCALC, TRIG, CHOLHDL, LDLDIRECT in the last 72 hours. Thyroid Function Tests: No results for input(s): TSH, T4TOTAL, FREET4, T3FREE, THYROIDAB in the last 72 hours. Anemia Panel: No results for input(s): VITAMINB12, FOLATE, FERRITIN, TIBC, IRON, RETICCTPCT in the last 72 hours. Urine analysis:    Component Value Date/Time    COLORURINE AMBER (A) 07/23/2019 2214   APPEARANCEUR CLOUDY (A) 07/23/2019 2214   LABSPEC 1.012 07/23/2019 2214   PHURINE 5.0 07/23/2019 2214   GLUCOSEU 50 (A) 07/23/2019 2214   HGBUR LARGE (A) 07/23/2019 2214   BILIRUBINUR NEGATIVE 07/23/2019 2214   [place] 07/23/2019 2214   PROTEINUR 30 (A) 07/23/2019 2214   NITRITE NEGATIVE 07/23/2019 2214   LEUKOCYTESUR SMALL (A) 07/23/2019 2214   Sepsis Labs: @LABRCNTIP (procalcitonin:4,lacticidven:4) )No results found for this or any previous visit (from the past 240 hour(s)).    Radiology Studies: No results found.  Scheduled Meds: . aspirin  325 mg Oral Daily  . atorvastatin  20 mg Oral q1800  . enoxaparin (LOVENOX) injection  40 mg Subcutaneous Q24H  . feeding supplement (ENSURE ENLIVE)  237 mL Oral BID BM  . folic acid  1 mg Oral Daily  . lactulose  20 g Oral BID  . metoprolol tartrate  50 mg Oral BID  . multivitamin with minerals  1 tablet Oral Daily  . omega-3 acid ethyl esters  1 g Oral Daily  . pantoprazole  40 mg Oral Daily  .  thiamine  100 mg Oral Daily   Or  . thiamine  100 mg Intravenous Daily   Continuous Infusions:   LOS: 141 days   Rhetta Mura, MD [facility] Pager: [email or web address] Password [facility] 12/11/2019, 10:43 AM

## 2019-12-11 NOTE — TOC Progression Note (Addendum)
Transition of Care [facility]) - Progression Note  Family Care Home/ALF Search    Patient Details  Name: Tyler [name] MRN: [id number] Date of Birth: 01-11-1971  Transition of Care [facility]) CM/SW Contact  Okey Dupre Lazaro Arms, LCSW Phone Number: 12/11/2019, 3:58 PM  Clinical Narrative: Call made to Metta Clines at [facility] in [place], [place] regarding patient and if a decision had been made on accepting/declining him. CSW advised that they currently have a COVID outbreak and are not accepting new patients at this time.  *[facility] in [place] contacted [phone]) and message left.  *[facility] in [place] contacted: [phone] and [phone] and both numbers disconnected or no longer in service.    Expected Discharge Plan: Skilled Nursing Facility Barriers to Discharge: Active Substance Use - Placement, SNF Pending payor source - LOG, SNF Pending bed offer  Expected Discharge Plan and Services Expected Discharge Plan: Skilled Nursing Facility   Discharge Planning Services: CM Consult, Other - See comment(Pt will need LOG for SNF placement) Post Acute Care Choice: Skilled Nursing Facility Living arrangements for the past 2 months: Single Family Home                 DME Arranged: N/A           HH Agency: NA         Social Determinants of Health (SDOH) Interventions  Patient homeless and needs a Family Care Home or ALF  Readmission Risk Interventions No flowsheet data found.

## 2019-12-12 LAB — CBC WITH DIFFERENTIAL/PLATELET
Abs Immature Granulocytes: 0.03 10*3/uL (ref 0.00–0.07)
Basophils Absolute: 0.1 10*3/uL (ref 0.0–0.1)
Basophils Relative: 1 %
Eosinophils Absolute: 0.6 10*3/uL — ABNORMAL HIGH (ref 0.0–0.5)
Eosinophils Relative: 5 %
HCT: 50.1 % (ref 39.0–52.0)
Hemoglobin: 16.9 g/dL (ref 13.0–17.0)
Immature Granulocytes: 0 %
Lymphocytes Relative: 34 %
Lymphs Abs: 3.8 10*3/uL (ref 0.7–4.0)
MCH: 30.1 pg (ref 26.0–34.0)
MCHC: 33.7 g/dL (ref 30.0–36.0)
MCV: 89.1 fL (ref 80.0–100.0)
Monocytes Absolute: 1.1 10*3/uL — ABNORMAL HIGH (ref 0.1–1.0)
Monocytes Relative: 10 %
Neutro Abs: 5.4 10*3/uL (ref 1.7–7.7)
Neutrophils Relative %: 50 %
Platelets: 258 10*3/uL (ref 150–400)
RBC: 5.62 MIL/uL (ref 4.22–5.81)
RDW: 11.9 % (ref 11.5–15.5)
WBC: 11 10*3/uL — ABNORMAL HIGH (ref 4.0–10.5)
nRBC: 0 % (ref 0.0–0.2)

## 2019-12-12 NOTE — Progress Notes (Signed)
Patient seen and examined, -No changes from Dr. Pandora Leiter note yesterday -Patient admitted with bilateral ischemic infarcts, with cognitive deficits/unable to live alone safely, awaiting placement, social work and case management following -Needs a 30-day event monitor eventually been discharged  Zannie Cove, MD

## 2019-12-13 NOTE — TOC Progression Note (Signed)
Transition of Care [facility]) - Progression Note    Patient Details  Name: Tyler [name] MRN: [id number] Date of Birth: 01-31-1971  Transition of Care [facility]) CM/SW Contact  Okey Dupre Lazaro Arms, LCSW Phone Number: 12/13/2019, 12:40 PM  Clinical Narrative: Call made to [employer] of [place] (11:56 pm) and message left for Lurena Joiner, resident care coordinator. Call made to [facility] [phone]) and spoke with Antionette Char. Was advised that they don't have a vacancy at this time. [name] referred me to [facility] [phone]) and advised me to ask for Caroline More, administrator or Harmon Dun, RCD. Call made to [facility] and message left for Ms. McSwain.       Expected Discharge Plan: Skilled Nursing Facility Barriers to Discharge: Active Substance Use - Placement, SNF Pending payor source - LOG, SNF Pending bed offer  Expected Discharge Plan and Services Expected Discharge Plan: Skilled Nursing Facility   Discharge Planning Services: CM Consult, Other - See comment(Pt will need LOG for SNF placement) Post Acute Care Choice: Skilled Nursing Facility Living arrangements for the past 2 months: Single Family Home                 DME Arranged: N/A           HH Agency: NA         Social Determinants of Health (SDOH) Interventions  Patient is homeless and has cognitive issues.  Readmission Risk Interventions No flowsheet data found.

## 2019-12-13 NOTE — Progress Notes (Signed)
Patient seen and examined, no changes from yesterday -Admitted with ischemic infarcts, continues to have cognitive deficits, unable to live alone safely, awaiting placement, difficulties with placement noted -Also needs a 30-day monitor at discharge  Zannie Cove, MD

## 2019-12-13 NOTE — Progress Notes (Signed)
Occupational Therapy Treatment Patient Details Name: Tyler [name] MRN: [id number] DOB: December 16, 1970 Today's Date: 12/13/2019    History of present illness 49 y/o male transferred from [place] for unresponsiveness, likely secondary to substance abuse. Pt with rhabdomyolysis and AKI. EEG revealed moderate diffuse encephalopathy. PMH includes polysubstance abuse and HTN. Imaging show acute bialteral cerebellar infarcts.    OT comments  Pt continues with cognitive deficits - he is not oriented to time, or place (knows [place]ac[place] Pontes, but thinks he is in [place]).  He demonstrates poor awareness of deficits.  He was confabulatory, and requires mod cues to redirect to task.  Goals updated.   Follow Up Recommendations  Other (comment)(pt is ALF or group home appropriate )    Equipment Recommendations  None recommended by OT    Recommendations for Other Services Other (comment)    Precautions / Restrictions Precautions Precautions: None Restrictions Weight Bearing Restrictions: No       Mobility Bed Mobility Overal bed mobility: Independent                Transfers Overall transfer level: Independent                    Balance Overall balance assessment: Independent                                         ADL either performed or assessed with clinical judgement   ADL                                         General ADL Comments: Pt is able to perform ADLs with supervision to ensure he initiates and performs them, and does so thoroughly      Vision       Perception     Praxis      Cognition Arousal/Alertness: Awake/alert Behavior During Therapy: Anxious Overall Cognitive Status: Impaired/Different from baseline Area of Impairment: Orientation;Attention;Memory;Following commands;Safety/judgement;Awareness;Problem solving                 Orientation Level: Disoriented to;Time;Situation;Place Current  Attention Level: Sustained Memory: Decreased short-term memory Following Commands: Follows multi-step commands inconsistently Safety/Judgement: Decreased awareness of deficits Awareness: Intellectual Problem Solving: Decreased initiation;Difficulty sequencing;Requires verbal cues General Comments: Pt is very irritable re: hospital stay and difficult to redirect to task.  he is oriented to being at [place], but thinks he is in [place].  He knows it is Thursday, but doesn't know the month or the date.  He insists he is able to call his daughter, but would not attempt this with me.  He was noted to confabulate stating he now has  brain tumor and they want him to go through a procedure with a thing on his head so they can charge him more; that he can't go home becuase they want the water to be stabilized, etc.  He at times is able to tell me he has had a stroke, but later looses this info and unable to tell it to me.  Allowed pt to vent and frequently redirected him as he would not work with me.  He was able to perform simple path finding on his unit with no cues.  Acquired a menu with pt as he was angry about his lunch.  I instructed  him how to use the menu and highlighted the number to call.         Exercises     Shoulder Instructions       General Comments      Pertinent Vitals/ Pain       Pain Assessment: No/denies pain  Home Living                                          Prior Functioning/Environment              Frequency  Min 1X/week        Progress Toward Goals  OT Goals(current goals can now be found in the care plan section)  Progress towards OT goals: Progressing toward goals  Acute Rehab OT Goals Patient Stated Goal: to get out of here  OT Goal Formulation: With patient Time For Goal Achievement: 12/27/19 Potential to Achieve Goals: Fair  Plan Discharge plan remains appropriate;Discharge plan needs to be updated(goals updated )     Co-evaluation                 AM-PAC OT "6 Clicks" Daily Activity     Outcome Measure   Help from another person eating meals?: None Help from another person taking care of personal grooming?: A Little Help from another person toileting, which includes using toliet, bedpan, or urinal?: None Help from another person bathing (including washing, rinsing, drying)?: A Little Help from another person to put on and taking off regular upper body clothing?: None Help from another person to put on and taking off regular lower body clothing?: None 6 Click Score: 22    End of Session    OT Visit Diagnosis: Other symptoms and signs involving cognitive function   Activity Tolerance Patient tolerated treatment well   Patient Left in bed;with call bell/phone within reach   Nurse Communication          Time: 1205-1227 OT Time Calculation (min): 22 min  Charges: OT General Charges $OT Visit: 1 Visit OT Treatments $Therapeutic Activity: 8-22 mins  Eber Jones., OTR/L Acute Rehabilitation Services Pager [phone] Office [phone]    Jeani Hawking M 12/13/2019, 4:35 PM

## 2019-12-14 NOTE — NC FL2 (Signed)
[place] LEVEL OF CARE SCREENING TOOL     IDENTIFICATION  Patient Name: Tyler [name] Birthdate: Dec 01, 1971 Sex: male Admission Date (Current Location): 07/23/2019  [place] and [place] Number:  Kathleen Argue [id number] [place] and Address:  [facility] [place], [place] [address] [place], [place] [address]      Provider Number: [id number]  Attending Physician Name and Address:  Domenic Polite, MD  Relative Name and Phone Number:  Kejon Feild, daughter [phone]    Current Level of Care: Hospital Recommended Level of Care: [place] Prior Approval Number:    Date Approved/Denied:   PASRR Number: [id number]  Discharge Plan: Domiciliary (Rest home)    Current Diagnoses: Patient Active Problem List   Diagnosis Date Noted  . Protein-calorie malnutrition, severe 08/09/2019  . Cerebral embolism with cerebral infarction 07/28/2019  . Rhabdomyolysis 07/23/2019  . Acute kidney failure ([place]) 07/23/2019  . Acute metabolic encephalopathy 22/63/3354  . Polysubstance abuse ([place]) 07/23/2019  . Hyperkalemia 07/23/2019  . Transaminitis 07/23/2019  . HTN (hypertension) 07/23/2019  . Fall 04/21/2012  . Traumatic subdural hematoma ([place]) 04/21/2012  . [place] 04/21/2012  . Left orbit fracture ([place]) 04/21/2012  . Left maxillary fracture ([place]) 04/21/2012  . Alcohol use 04/21/2012  . Cocaine use 04/21/2012    Orientation RESPIRATION BLADDER Height & Weight     Self, Place  Normal Continent Weight: 71.6 kg Height:  5\' 8"  (172.7 cm)  BEHAVIORAL SYMPTOMS/MOOD NEUROLOGICAL BOWEL NUTRITION STATUS  [place]   Continent Diet(dysphagia 3 thin liquids)  AMBULATORY STATUS COMMUNICATION OF NEEDS Skin   Independent Verbally Normal                       Personal Care Assistance Level of Assistance  Bathing, Feeding, Dressing Bathing Assistance: Independent Feeding assistance: Independent Dressing Assistance: Limited assistance Total Care  Assistance: Limited assistance   Functional Limitations Info  Hearing, Speech Sight Info: Impaired Hearing Info: Adequate Speech Info: Adequate    SPECIAL CARE FACTORS FREQUENCY  PT (By licensed PT), OT (By licensed OT), Speech therapy     PT Frequency: 1x/wk OT Frequency: 1x/wk     Speech Therapy Frequency: 1x-2x/wk      Contractures Contractures Info: Not present    Additional Factors Info  Code Status, Allergies Code Status Info: Full code Allergies Info: No known drug allergies   Insulin Sliding Scale Info: See dc summary for dose       Current Medications (12/14/2019):  This is the current hospital active medication list Current Facility-Administered Medications  Medication Dose Route Frequency Provider Last Rate Last Admin  . acetaminophen (TYLENOL) tablet 650 mg  650 mg Oral Q4H PRN Lovey Newcomer T, NP   650 mg at 12/13/19 2204   Or  . acetaminophen (TYLENOL) suppository 650 mg  650 mg Rectal Q6H PRN Blount, Lolita Cram, NP      . alum & mag hydroxide-simeth (MAALOX/MYLANTA) 200-200-20 MG/5ML suspension 30 mL  30 mL Oral Q4H PRN Amin, Ankit Chirag, MD      . aspirin tablet 325 mg  325 mg Oral Daily Amin, Ankit Chirag, MD   325 mg at 12/09/19 1011  . atorvastatin (LIPITOR) tablet 20 mg  20 mg Oral q1800 Kyle, Tyrone A, DO   20 mg at 12/07/19 1648  . diclofenac sodium (VOLTAREN) 1 % transdermal gel 2 g  2 g Topical TID PRN Harold Hedge, MD      . enoxaparin (LOVENOX) injection 40 mg  40 mg Subcutaneous Q24H Briant Cedar, MD   40 mg at 11/27/19 1300  . feeding supplement (ENSURE ENLIVE) (ENSURE ENLIVE) liquid 237 mL  237 mL Oral BID BM Hanley Ben, Kshitiz, MD   237 mL at 12/01/19 0844  . folic acid (FOLVITE) tablet 1 mg  1 mg Oral Daily Hanley Ben, Kshitiz, MD   1 mg at 12/08/19 0940  . guaiFENesin-dextromethorphan (ROBITUSSIN DM) 100-10 MG/5ML syrup 5 mL  5 mL Oral Q4H PRN Amin, Ankit Chirag, MD   5 mL at 10/22/19 1052  . hydrALAZINE (APRESOLINE) tablet 10 mg  10 mg Oral  Q6H PRN Sara Chu D, MD      . hydrocortisone (ANUSOL-HC) 2.5 % rectal cream 1 application  1 application Topical QID PRN Amin, Ankit Chirag, MD      . hydrocortisone cream 1 % 1 application  1 application Topical TID PRN Amin, Ankit Chirag, MD      . ipratropium-albuterol (DUONEB) 0.5-2.5 (3) MG/3ML nebulizer solution 3 mL  3 mL Nebulization Q4H PRN Amin, Ankit Chirag, MD      . lactulose (CHRONULAC) 10 GM/15ML solution 20 g  20 g Oral BID Amin, Ankit Chirag, MD   20 g at 11/15/19 0949  . metoprolol tartrate (LOPRESSOR) tablet 50 mg  50 mg Oral BID Elease Etienne, MD   50 mg at 12/10/19 0900  . multivitamin with minerals tablet 1 tablet  1 tablet Oral Daily Briant Cedar, MD   1 tablet at 12/08/19 0940  . Muscle Rub CREA 1 application  1 application Topical PRN Dimple Nanas, MD   1 application at 10/01/19 0955  . omega-3 acid ethyl esters (LOVAZA) capsule 1 g  1 g Oral Daily Alessandra Bevels, MD   1 g at 12/08/19 0940  . ondansetron (ZOFRAN) tablet 4 mg  4 mg Oral Q6H PRN Hillary Bow, DO   4 mg at 09/13/19 2113   Or  . ondansetron (ZOFRAN) injection 4 mg  4 mg Intravenous Q6H PRN Hillary Bow, DO      . pantoprazole (PROTONIX) EC tablet 40 mg  40 mg Oral Daily Regalado, Belkys A, MD   40 mg at 12/08/19 0940  . phenol (CHLORASEPTIC) mouth spray 1 spray  1 spray Mouth/Throat PRN Amin, Ankit Chirag, MD      . polyethylene glycol (MIRALAX / GLYCOLAX) packet 17 g  17 g Oral Daily PRN Amin, Ankit Chirag, MD      . polyvinyl alcohol (LIQUIFILM TEARS) 1.4 % ophthalmic solution 1 drop  1 drop Both Eyes PRN Amin, Ankit Chirag, MD      . senna-docusate (Senokot-S) tablet 2 tablet  2 tablet Oral QHS PRN Amin, Ankit Chirag, MD      . sodium chloride (OCEAN) 0.65 % nasal spray 1 spray  1 spray Each Nare PRN Amin, Ankit Chirag, MD      . sodium chloride flush (NS) 0.9 % injection 10-40 mL  10-40 mL Intracatheter PRN Amin, Ankit Chirag, MD      . thiamine (VITAMIN B-1) tablet  100 mg  100 mg Oral Daily Julian Reil, Jared M, DO   100 mg at 12/08/19 0940   Or  . thiamine (B-1) injection 100 mg  100 mg Intravenous Daily Lyda Perone M, DO   100 mg at 11/01/19 1114     Discharge Medications: Please see discharge summary for a list of discharge medications.  Relevant Imaging Results:  Relevant Lab Results:   Additional Information SSN [id number]  Bess Kinds, RN

## 2019-12-14 NOTE — Progress Notes (Signed)
PROGRESS NOTE  Tyler [name]   FTD:[id number]  DOB: 02/04/1971  DOA: 07/23/2019  Brief Narrative:  Tyler [name] is a48 y.o.malewith a history of polysubstance abuse who presented to [facility] ER after being found down, On presentation, Tyler [name] was found to have severe rhabdomyolysis with acute kidney injury and hyperkalemia. Tyler [name] was started on IV fluids including bicarb drip and transferred to [facility] for further care. Rhabdomyolysis improved but Tyler [name] had persistence of encephalopathy and was found to have acute bilateral cerebellar infarct. Neurology was consulted and has subsequently signed off. PT recommended SNF placement. Prolonged hospitalization due to placement issues.  -Patient's cognitive debilities prevent safe discharge at this point, Tyler [name] does not have any family members willing to take care of him and social workers attempting have DSS assist with guardianship, placement continues to be challenging   Subjective:  -No events overnight, sleeping comfortably with covers on his head  -Declines meds periodically per staff  Assessment & Plan:     Acute b/l ischemic infarcts - Due to persistence of encephalopathy this admission Tyler [name] underwent an MRI brain which showed bilateral cerebral and cerebellar patchy infarcts  - Resulting in cognitive deficits and is no longer able to live alone--dispo is difficult given his needing placement - needs 30 day event monitor as outpt when discharged, per neuro - continue aspirin, statin per neuro recommendations - Carotid duplex unremarkable - ECHO showed not thrombus and EF was 60-65% - A1c 5.1%  -PT OT eval completed, SNF recommended, remains difficult placement as noted above, also does not have capacity to make decisions, patient's son and daughters have different issues and are unable to assume guardianship with the patient, social work working with DSS    Rhabdomyolysis with AKI - has resolved - renal ultrasound was unremarkable     Acute metabolic encephalopathy - initially required restraints but has improved - awake alert and oriented x2    Polysubstance abuse  - alcohol and drug use   Essential HTN - cont Metoprolol  Severe protein calorie malnutrition - cont dietary supplements  Back pain - Tyler [name] has T11 and L1 fractures noted on xray - pain resolved, Tyler [name] is ambulatory without issue  UTI  -Completed treatment with ceftriaxone  Severe generalized weakness - PT/OT recommends SNF   Time spent in minutes: 15  DVT prophylaxis: Lovenox Code Status: Full code Family Communication:  Disposition Plan: ALF vs SNF- awaiting a bed- difficult placement Consultants:   Neurology  psych Procedures:  Echocardiogram 1. The left ventricle has normal systolic function with an ejection fraction of 60-65%. The cavity size was normal. Left ventricular diastolic Doppler parameters are consistent with impaired relaxation. No evidence of left ventricular regional wall motion abnormalities. 2. The right ventricle has normal systolic function. The cavity was normal. There is no increase in right ventricular wall thickness. Right ventricular systolic pressure is normal with an estimated pressure of 21.0 mmHg. 3. The aortic valve is tricuspid. Moderate sclerosis of the aortic valve. 4. The aorta is normal unless otherwise noted.  EEG This study issuggestive of moderate diffuse encephalopathy which is nonspecific to etiology. No seizures or epileptiform discharges were seen throughout the recording.  Antimicrobials:  Anti-infectives (From admission, onward)   Start     Dose/Rate Route Frequency Ordered Stop   07/25/19 0745  cefTRIAXone (ROCEPHIN) 1 g in sodium chloride 0.9 % 100 mL IVPB     1 g 200 mL/hr over 30 Minutes Intravenous Every 24 hours 07/25/19 0734 07/29/19 1518  Objective: Vitals:   12/13/19 1652 12/13/19 2249 12/14/19 0500 12/14/19 0926  BP: (!) 138/96 (!) 151/82 140/80 107/77  Pulse: 67 64 70  71  Resp: 16 18 19 18   Temp: 98.1 F (36.7 C) 97.9 F (36.6 C) 98 F (36.7 C) (!) 97.5 F (36.4 C)  TempSrc: Oral  Oral Oral  SpO2: 92% 97% 98% 96%  Weight:      Height:        Intake/Output Summary (Last 24 hours) at 12/14/2019 1427 Last data filed at 12/14/2019 1300 Gross per 24 hour  Intake 840 ml  Output 0 ml  Net 840 ml   Filed Weights   12/08/19 2052 12/09/19 2116 12/10/19 2134  Weight: 72.2 kg 72.3 kg 71.6 kg    Examination: Gen: Awake alert, oriented to self and place, tangential speech HEENT: PERRLA, Neck supple, no JVD Lungs: Clear CVS: RRR,No Gallops,Rubs or new Murmurs Abd: soft, Non tender, non distended, BS present Extremities: No edema Neuro: Moves all extremities, no localizing signs, cognitive deficits noted Skin: no new rashes  Data Reviewed: I have personally reviewed following labs and imaging studies  CBC: Recent Labs  Lab 12/12/19 0009  WBC 11.0*  NEUTROABS 5.4  HGB 16.9  HCT 50.1  MCV 89.1  PLT 258   Basic Metabolic Panel: No results for input(s): NA, K, CL, CO2, GLUCOSE, BUN, CREATININE, CALCIUM, MG, PHOS in the last 168 hours. GFR: Estimated Creatinine Clearance: 65.8 mL/min (A) (by C-G formula based on SCr of 1.3 mg/dL (H)). Liver Function Tests: No results for input(s): AST, ALT, ALKPHOS, BILITOT, PROT, ALBUMIN in the last 168 hours. No results for input(s): LIPASE, AMYLASE in the last 168 hours. No results for input(s): AMMONIA in the last 168 hours. Coagulation Profile: No results for input(s): INR, PROTIME in the last 168 hours. Cardiac Enzymes: No results for input(s): CKTOTAL, CKMB, CKMBINDEX, TROPONINI in the last 168 hours. BNP (last 3 results) No results for input(s): PROBNP in the last 8760 hours. HbA1C: No results for input(s): HGBA1C in the last 72 hours. CBG: No results for input(s): GLUCAP in the last 168 hours. Lipid Profile: No results for input(s): CHOL, HDL, LDLCALC, TRIG, CHOLHDL, LDLDIRECT in the last 72  hours. Thyroid Function Tests: No results for input(s): TSH, T4TOTAL, FREET4, T3FREE, THYROIDAB in the last 72 hours. Anemia Panel: No results for input(s): VITAMINB12, FOLATE, FERRITIN, TIBC, IRON, RETICCTPCT in the last 72 hours. Urine analysis:    Component Value Date/Time   COLORURINE AMBER (A) 07/23/2019 2214   APPEARANCEUR CLOUDY (A) 07/23/2019 2214   LABSPEC 1.012 07/23/2019 2214   PHURINE 5.0 07/23/2019 2214   GLUCOSEU 50 (A) 07/23/2019 2214   HGBUR LARGE (A) 07/23/2019 2214   BILIRUBINUR NEGATIVE 07/23/2019 2214   KETONESUR NEGATIVE 07/23/2019 2214   PROTEINUR 30 (A) 07/23/2019 2214   NITRITE NEGATIVE 07/23/2019 2214   LEUKOCYTESUR SMALL (A) 07/23/2019 2214   Sepsis Labs: @LABRCNTIP (procalcitonin:4,lacticidven:4) )No results found for this or any previous visit (from the past 240 hour(s)).    Radiology Studies: No results found.  Scheduled Meds: . aspirin  325 mg Oral Daily  . atorvastatin  20 mg Oral q1800  . enoxaparin (LOVENOX) injection  40 mg Subcutaneous Q24H  . feeding supplement (ENSURE ENLIVE)  237 mL Oral BID BM  . folic acid  1 mg Oral Daily  . lactulose  20 g Oral BID  . metoprolol tartrate  50 mg Oral BID  . multivitamin with minerals  1 tablet Oral Daily  .  omega-3 acid ethyl esters  1 g Oral Daily  . pantoprazole  40 mg Oral Daily  . thiamine  100 mg Oral Daily   Or  . thiamine  100 mg Intravenous Daily   Continuous Infusions:   LOS: 144 days   Domenic Polite, MD [facility]  12/14/2019, 2:27 PM

## 2019-12-14 NOTE — TOC Progression Note (Signed)
Transition of Care [facility]) - Progression Note    Patient Details  Name: Kyden Potash MRN: [id number] Date of Birth: 04-Feb-1971  Transition of Care [facility]) CM/SW Contact  Bess Kinds, RN Phone Number: [phone] 12/14/2019, 4:48 PM  Clinical Narrative:    Spoke with patient at the bedside. He is oriented to person and hospital. He wants to get out of hospital. His confabulation was increasingly apparent the longer I spoke with him. Ambulating in room and hall.  Discussed patient case with [facility] who also has a family care home. FL2 and demographics faxed for consideration.   TOC team following for transition needs.   Expected Discharge Plan: Skilled Nursing Facility Barriers to Discharge: Active Substance Use - Placement, SNF Pending payor source - LOG, SNF Pending bed offer  Expected Discharge Plan and Services Expected Discharge Plan: Skilled Nursing Facility   Discharge Planning Services: CM Consult, Other - See comment(Pt will need LOG for SNF placement) Post Acute Care Choice: Skilled Nursing Facility Living arrangements for the past 2 months: Single Family Home                 DME Arranged: N/A           HH Agency: NA         Social Determinants of Health (SDOH) Interventions    Readmission Risk Interventions No flowsheet data found.

## 2019-12-14 NOTE — Plan of Care (Signed)
  Problem: Health Behavior/Discharge Planning: Goal: Ability to manage health-related needs will improve Outcome: Progressing   

## 2019-12-15 NOTE — Progress Notes (Signed)
Patient seen and examined, resting comfortably he is awake and alert with cognitive deficits as a sequelae from his stroke. -Medically remains stable -Awaiting safe disposition  Tyler Cove, MD

## 2019-12-15 NOTE — TOC Progression Note (Addendum)
Transition of Care [facility]) - Progression Note    Patient Details  Name: Tyler [name] MRN: [id number] Date of Birth: 1971-04-12  Transition of Care [facility]) CM/SW Contact  Gildardo Griffes, [place] Phone Number: [phone] 12/15/2019, 5:13 PM  Clinical Narrative:     CSW spoke with patient who requested to speak to [facility] member as he wants to go home. CSW asked him who he lives with he states his aunt Tyler [name] and his son. He reports they provide support at home and he misses his family. He questions as to why he has not been discharged yet. CSW spoke with Tyler [name] and confirmed address on facesheet is correct, CSW notes PT signed off 12/08 stating patient mobility is identified as independent. Tyler [name] reports she provides support as needed to patient and is also ready for him to come home. Patient lives with his aunt Tyler [name] and his son. CSW spoke with [facility] supervisor Tyler [name] who agree after chart review that patient should be able to safely discharge home with identified family support. CSW spoke with MD who is also in agreement with dc home tomorrow. CSW informed patient who expressed being happy as he has not seen his family in a long time.   Tyler [name] informed of tomorrow's dc, she reports it takes her around 40 minutes to drive to hospital if she could be informed in a timely manner of patient's discharge in order to pick him up at main entrance.           Expected discharge plan: home/self care  Expected Discharge Plan and Services                    DME Arranged: N/A           HH Agency: NA         Social Determinants of Health (SDOH) Interventions    Readmission Risk Interventions No flowsheet data found.

## 2019-12-16 MED ORDER — PANTOPRAZOLE SODIUM 40 MG PO TBEC: 40.0000 mg | DELAYED_RELEASE_TABLET | Freq: Every day | ORAL | 0 refills | Status: DC

## 2019-12-16 MED ORDER — METOPROLOL TARTRATE 50 MG PO TABS: 50.0000 mg | ORAL_TABLET | Freq: Two times a day (BID) | ORAL | 1 refills | Status: DC

## 2019-12-16 MED ORDER — ACETAMINOPHEN 325 MG PO TABS: 650.0000 mg | ORAL_TABLET | ORAL | Status: DC | PRN

## 2019-12-16 MED ORDER — ATORVASTATIN CALCIUM 20 MG PO TABS: 20.0000 mg | ORAL_TABLET | Freq: Every day | ORAL | 0 refills | Status: DC

## 2019-12-16 MED ORDER — LACTULOSE 10 GM/15ML PO SOLN: 20.0000 g | Freq: Every day | ORAL | 1 refills | Status: DC

## 2019-12-16 MED ORDER — ASPIRIN 325 MG PO TABS: 325.0000 mg | ORAL_TABLET | Freq: Every day | ORAL | Status: AC

## 2019-12-16 NOTE — Discharge Summary (Signed)
Physician Discharge Summary  Tyler [name] AJO:[id number] DOB: [birth year] DOA: 07/23/2019  PCP: Patient, No Pcp Per  Admit date: 07/23/2019 Discharge date: 12/16/2019  Time spent: 35 minutes  Recommendations for Outpatient Follow-up:  1. PCP in 1 week 2. [facility] in 4 weeks 3. [facility] for 30-day monitor   Discharge Diagnoses:  Principal Problem: Bilateral cerebral embolism with cerebral infarction Acute metabolic encephalopathy Cognitive deficits Alcohol abuse Alcoholic steatohepatitis with question early cirrhosis on imaging   Rhabdomyolysis   Acute kidney failure (HCC)   Acute metabolic encephalopathy   Polysubstance abuse (HCC)   Hyperkalemia   Transaminitis   HTN (hypertension)   Protein-calorie malnutrition, severe   Discharge Condition: Improving  Diet recommendation: Low-sodium, heart healthy  Filed Weights   12/10/19 2134 12/14/19 2212 12/15/19 2043  Weight: 71.6 kg 71.4 kg 71.1 kg    History of present illness:  49 year old male with history of polysubstance abuse including heroin presented to [facility] ER after being found down with a syringe nearby.  He was given Narcan with minimal improvement.  Found in severe rhabdomyolysis with acute kidney injury and hyperkalemia.  He was started on IV fluids including bicarb drip and transferred to [facility] for further care.  Rhabdomyolysis improved but he had persistence of encephalopathy and was found to have acute bilateral cerebellar infarct.  -He was also found to have elevated ammonia level, Neurology was consulted.    Hospital Course:    Acute b/l ischemic infarcts - Due to persistence of encephalopathy this admission he underwent an MRI brain which showed bilateral cerebral and cerebellar patchy infarcts  - Resulting in cognitive deficits and is no longer able to live alone - needs 30 day event monitor as outpt when discharged, per neuro - continue aspirin, statin per neuro  recommendations - Carotid duplex unremarkable - ECHO showed not thrombus and EF was 60-65% - A1c 5.1%  -PT OT eval completed, SNF recommended initially, he was very difficult to place -Through his hospital course his physical strength has improved and his somewhat physically independent at this time needs supervision -Was followed by case management and social work throughout this hospitalization, at this time his family is willing to take him home and help him with supervision    Rhabdomyolysis with AKI - has resolved - renal ultrasound was unremarkable     Acute metabolic encephalopathy -Secondary to alcohol abuse, component of hepatic encephalopathy, strokes - initially required restraints but has improved -Abdominal ultrasound did note hepatic steatosis likely from alcohol abuse and some concern for early cirrhosis -He was treated with lactulose this admission, continue this at discharge -Follow-up with PCP for further evaluation and management for cirrhosis - awake alert and oriented x2 now  Alcoholic liver disease/?  Early cirrhosis -See discussion above, continue lactulose at discharge, follow-up with gastroenterology    Polysubstance abuse  - alcohol and cocaine -Counseled   Essential HTN - cont Metoprolol  Severe protein calorie malnutrition - cont dietary supplements  Back pain - he has T11 and L1 fractures noted on xray - pain resolved, he is ambulatory without issue  UTI  -Completed treatment with ceftriaxone  Severe generalized weakness - PT/OT recommended SNF initially, now recommends supervision, patient will go home and live with his aunt and son in [place]   Discharge Exam: Vitals:   12/16/19 0610 12/16/19 0925  BP: 111/75 114/72  Pulse: 70 72  Resp: 16 18  Temp: 97.6 F (36.4 C) 98 F (36.7 C)  SpO2: 97% 98%  General: Awake alert, oriented to self place, only partly to time, mild cognitive deficits noted Cardiovascular: S1-S2,  regular rate rhythm Respiratory: Clear  Discharge Instructions   Discharge Instructions    Ambulatory referral to Neurology   Complete by: As directed    Follow up in stroke clinic at [facility] with Frann Rider, NP in about 4 weeks. If not available, consider Dr. Antony Contras, Dr. Bess Harvest, or Dr. Sarina Ill.   For d/c to SNF   Diet - low sodium heart healthy   Complete by: As directed    Increase activity slowly   Complete by: As directed      Allergies as of 12/16/2019   No Known Allergies     Medication List    STOP taking these medications   GOODY HEADACHE PO   ibuprofen 200 MG tablet Commonly known as: ADVIL     TAKE these medications   acetaminophen 325 MG tablet Commonly known as: TYLENOL Take 2 tablets (650 mg total) by mouth every 4 (four) hours as needed for mild pain (or Fever >/= 101).   aspirin 325 MG tablet Take 1 tablet (325 mg total) by mouth daily. Start taking on: December 17, 2019   atorvastatin 20 MG tablet Commonly known as: LIPITOR Take 1 tablet (20 mg total) by mouth daily at 6 PM.   lactulose 10 GM/15ML solution Commonly known as: CHRONULAC Take 30 mLs (20 g total) by mouth daily.   metoprolol tartrate 50 MG tablet Commonly known as: LOPRESSOR Take 1 tablet (50 mg total) by mouth 2 (two) times daily.   pantoprazole 40 MG tablet Commonly known as: PROTONIX Take 1 tablet (40 mg total) by mouth daily. Start taking on: December 17, 2019      No Known Allergies Follow-up Information    [facility] Follow up in 4 week(s).   Specialty: Neurology Why: stroke clinic. office will call with appt date and time Contact information: [address] [place] [phone]       PCP. Schedule an appointment as soon as possible for a visit in 1 week(s).            The results of significant diagnostics from this hospitalization (including imaging,  microbiology, ancillary and laboratory) are listed below for reference.    Significant Diagnostic Studies: No results found.  Microbiology: No results found for this or any previous visit (from the past 240 hour(s)).   Labs: Basic Metabolic Panel: No results for input(s): NA, K, CL, CO2, GLUCOSE, BUN, CREATININE, CALCIUM, MG, PHOS in the last 168 hours. Liver Function Tests: No results for input(s): AST, ALT, ALKPHOS, BILITOT, PROT, ALBUMIN in the last 168 hours. No results for input(s): LIPASE, AMYLASE in the last 168 hours. No results for input(s): AMMONIA in the last 168 hours. CBC: Recent Labs  Lab 12/12/19 0009  WBC 11.0*  NEUTROABS 5.4  HGB 16.9  HCT 50.1  MCV 89.1  PLT 258   Cardiac Enzymes: No results for input(s): CKTOTAL, CKMB, CKMBINDEX, TROPONINI in the last 168 hours. BNP: BNP (last 3 results) No results for input(s): BNP in the last 8760 hours.  ProBNP (last 3 results) No results for input(s): PROBNP in the last 8760 hours.  CBG: No results for input(s): GLUCAP in the last 168 hours.     Signed:  Domenic Polite MD.  [facility] 12/16/2019, 11:10 AM

## 2019-12-16 NOTE — TOC Progression Note (Signed)
Transition of Care [facility]) - Progression Note    Patient Details  Name: Tyler [name] MRN: [id number] Date of Birth: 30-Aug-1971  Transition [facility]) CM/SW Contact  Gildardo Griffes, [place] Phone Number: [phone] 12/16/2019, 12:57 PM  Clinical Narrative:     CSW spoke with Gigi Gin and informed of discharge readiness, she reports she will be at [name] Entrance at 1:45 pm if patient can be wheeled down at that time. RN informed.   Expected Discharge Plan: Skilled Nursing Facility Barriers to Discharge: Active Substance Use - Placement, SNF Pending payor source - LOG, SNF Pending bed offer  Expected Discharge Plan and Services Expected Discharge Plan: Skilled Nursing Facility   Discharge Planning Services: CM Consult, Other - See comment(Pt will need LOG for SNF placement) Post Acute Care Choice: Skilled Nursing Facility Living arrangements for the past 2 months: Single Family Home Expected Discharge Date: 12/16/19               DME Arranged: N/A           HH Agency: NA         Social Determinants of Health (SDOH) Interventions    Readmission Risk Interventions No flowsheet data found.

## 2020-06-08 DIAGNOSIS — I361 Nonrheumatic tricuspid (valve) insufficiency: Secondary | ICD-10-CM

## 2020-06-08 DIAGNOSIS — I4891 Unspecified atrial fibrillation: Secondary | ICD-10-CM

## 2021-05-23 IMAGING — MR MRA HEAD WITHOUT CONTRAST
1 series · 20 of 48 positions shown · non-contrast
Comparison: None.

CLINICAL DATA: Stroke follow-up. Acute bilateral cerebral and
cerebellar infarcts on MRI.

EXAM:
MRA HEAD WITHOUT CONTRAST
TECHNIQUE: Angiographic images of the Circle of Willis were obtained using MRA
technique without intravenous contrast.

[Series 3: (id) mt fs · axial · 1.4mm · 0.43mm/px · z∈[-59,+61]mm · 20 of 182 slices shown]
[im 1/182]
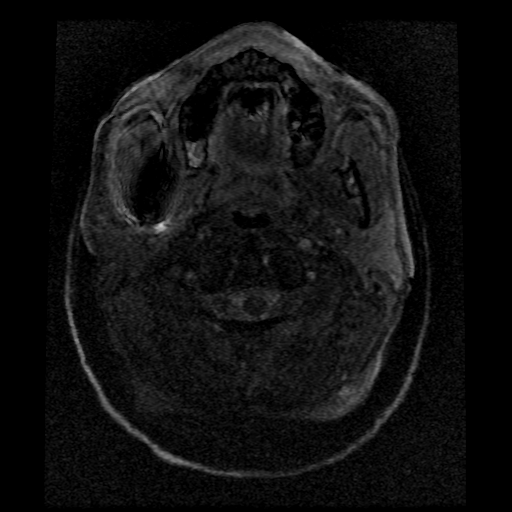
[im 4/182]
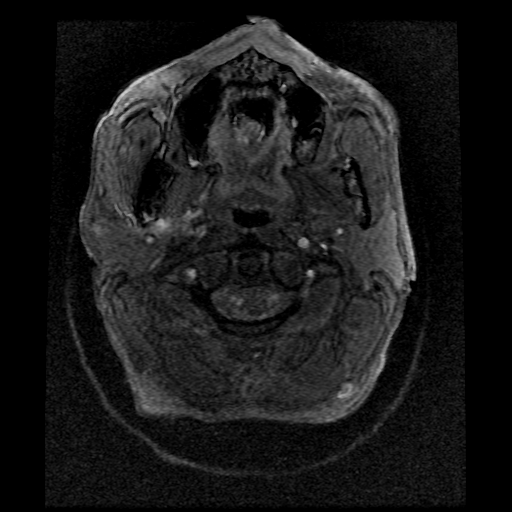
[im 8/182]
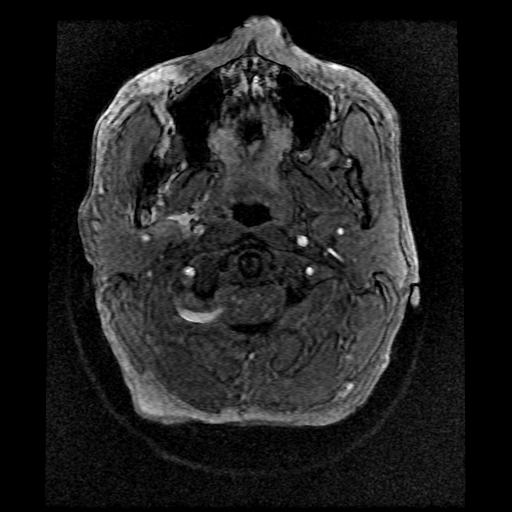
[im 12/182]
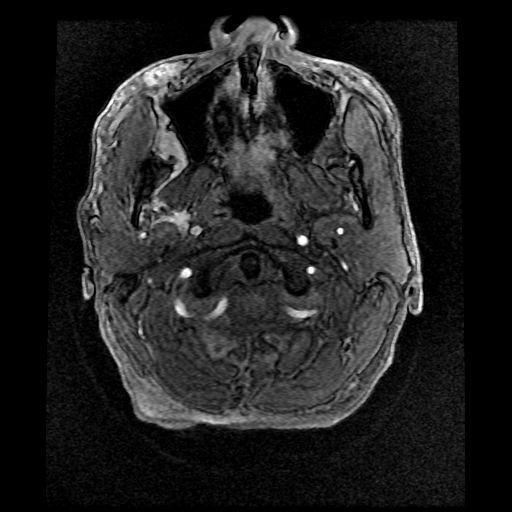
[im 16/182]
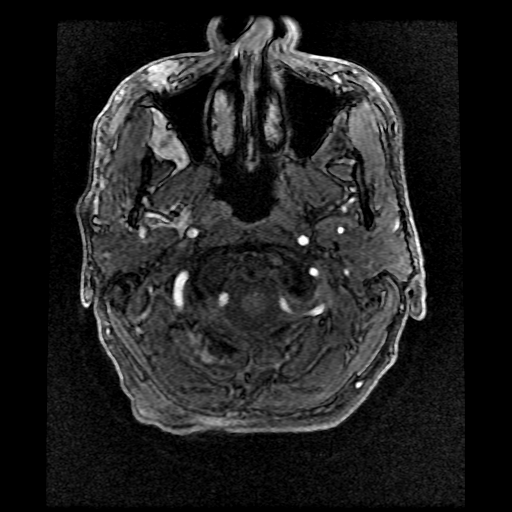
[im 20/182]
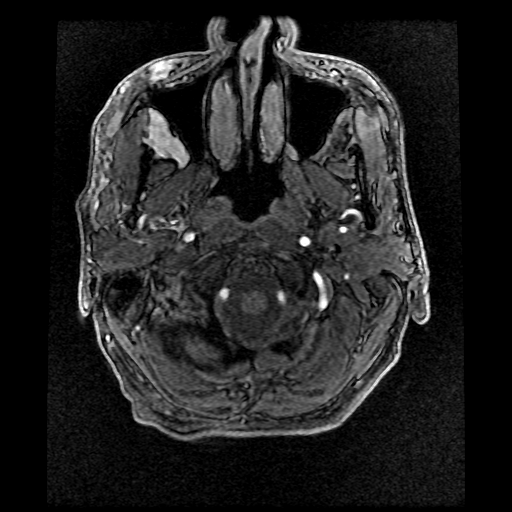
[im 24/182]
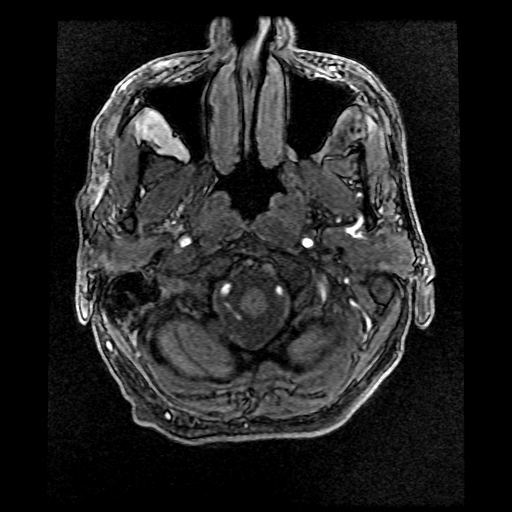
[im 27/182]
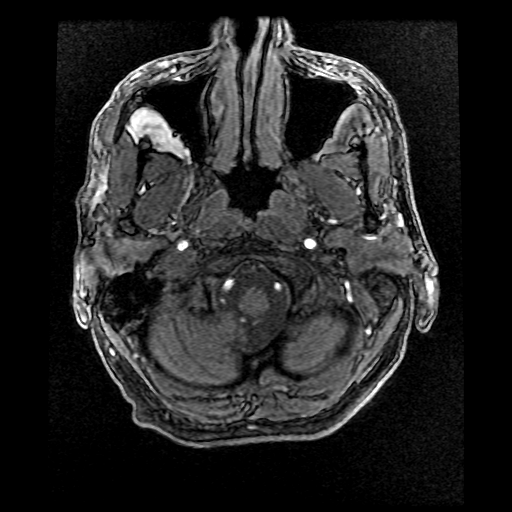
[im 31/182]
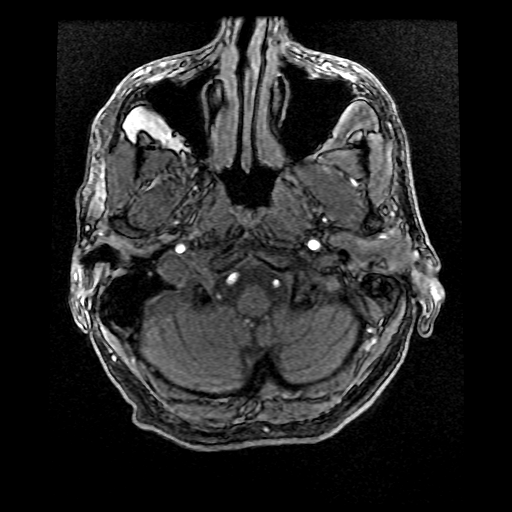
[im 35/182]
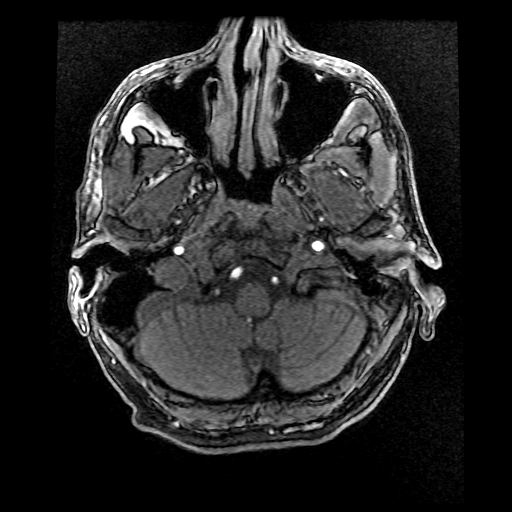
[im 39/182]
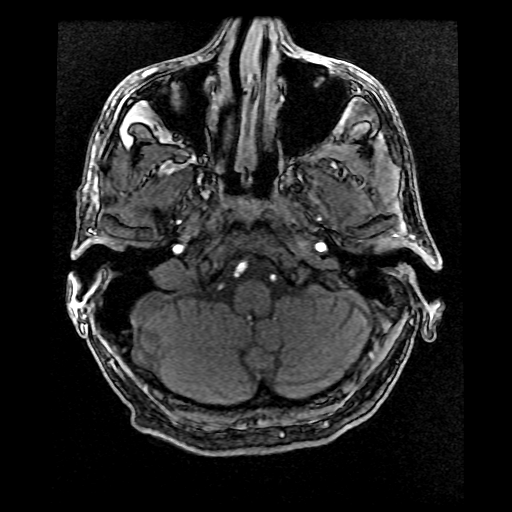
[im 43/182]
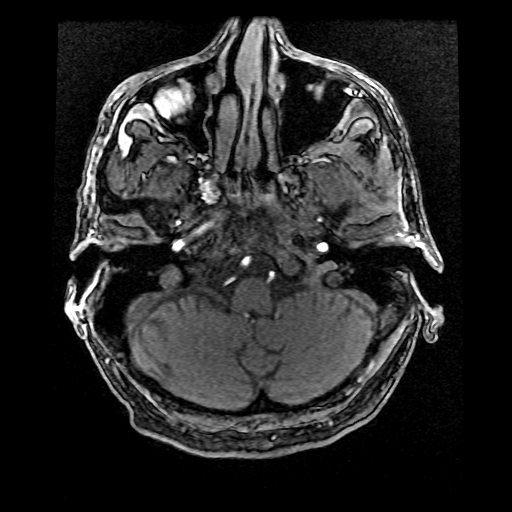
[im 58/182]
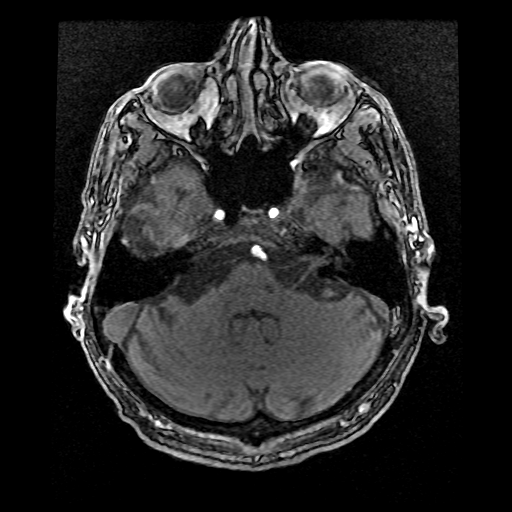
[im 81/182]
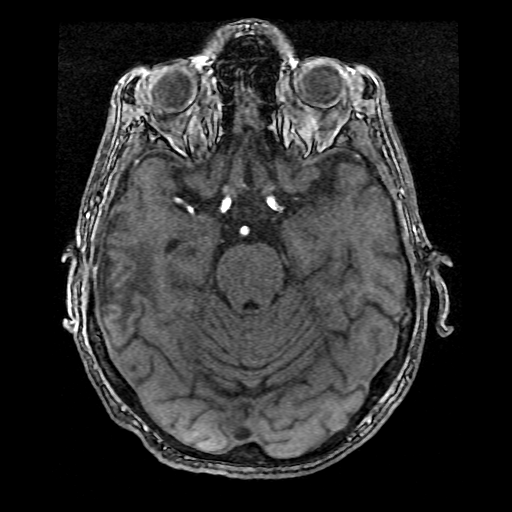
[im 93/182]
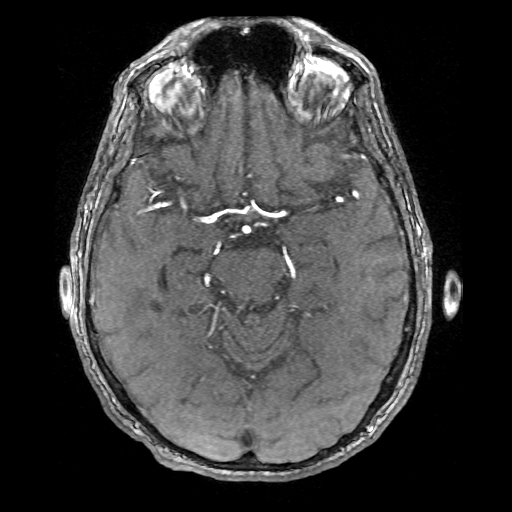
[im 104/182]
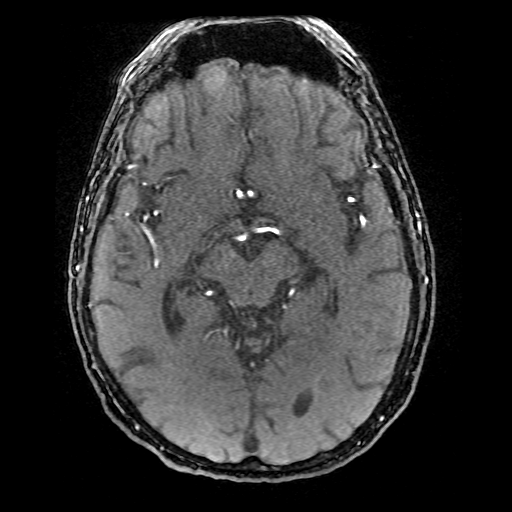
[im 128/182]
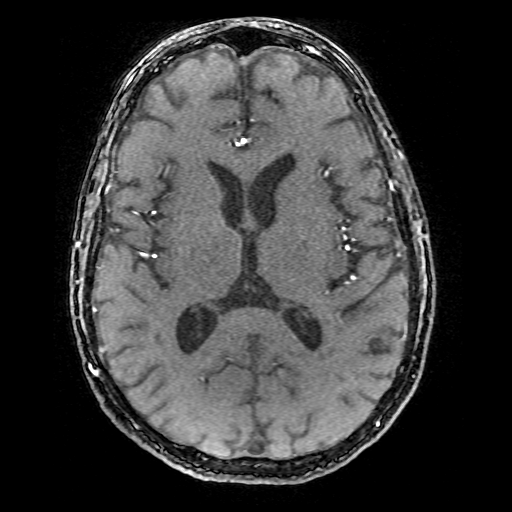
[im 151/182]
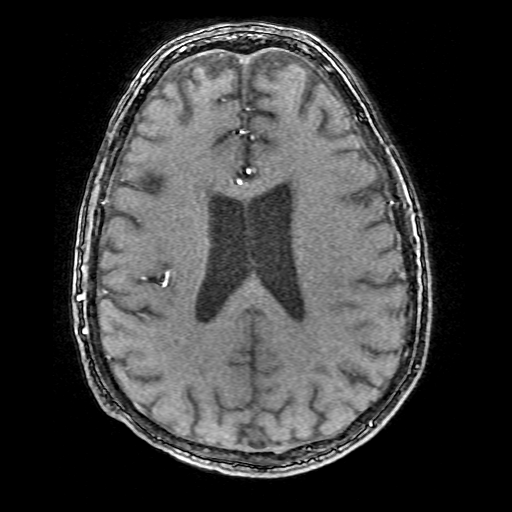
[im 155/182]
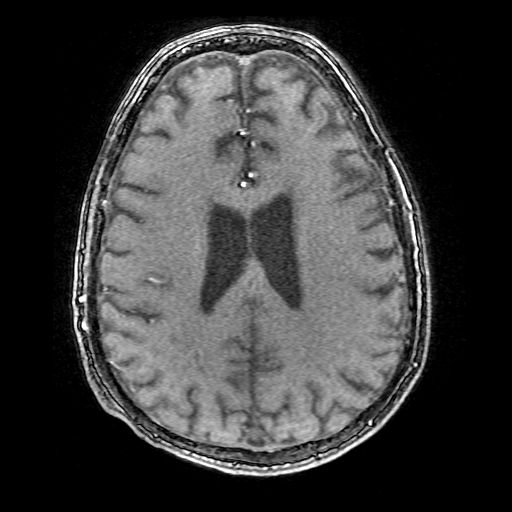
[im 174/182]
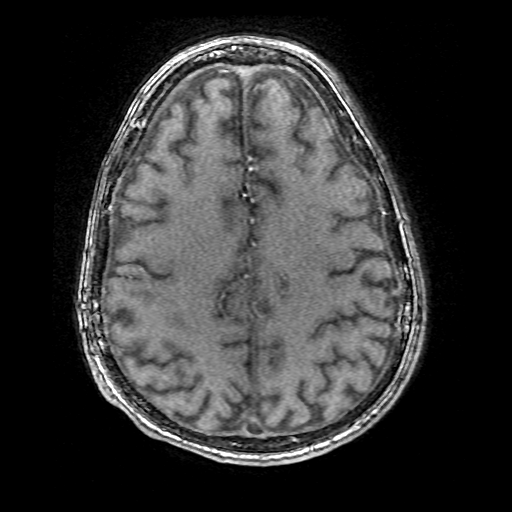

[20 of 48 positions shown; findings below may reference images not displayed]

FINDINGS: The study is mildly motion degraded.

The visualized distal vertebral arteries are widely patent to the
basilar with the right being mildly dominant. Patent PICAs and SCAs
are seen bilaterally. The basilar artery is widely patent. Posterior
communicating arteries are not identified and may be diminutive or
absent. PCAs are patent without evidence of significant stenosis.

The internal carotid arteries are widely patent from skull base to
carotid termini. ACAs and MCAs are patent without evidence of
proximal branch occlusion or significant proximal stenosis within
limitations of mild motion artifact. No aneurysm is identified.
IMPRESSION: Negative head MRA.

## 2023-09-13 ENCOUNTER — Emergency Department (HOSPITAL_COMMUNITY): Payer: 59

## 2023-09-13 ENCOUNTER — Encounter (HOSPITAL_COMMUNITY): Payer: Self-pay

## 2023-09-13 ENCOUNTER — Other Ambulatory Visit: Payer: Self-pay

## 2023-09-13 ENCOUNTER — Inpatient Hospital Stay (HOSPITAL_COMMUNITY)
Admission: EM | Admit: 2023-09-13 | Discharge: 2023-09-17 | DRG: 871 | Disposition: A | Discharge status: Home / Self Care | Payer: 59 | Attending: Family Medicine | Admitting: Family Medicine

## 2023-09-13 DIAGNOSIS — S0001XA Abrasion of scalp, initial encounter: Secondary | ICD-10-CM | POA: Diagnosis not present

## 2023-09-13 DIAGNOSIS — E876 Hypokalemia: Secondary | ICD-10-CM

## 2023-09-13 DIAGNOSIS — R6520 Severe sepsis without septic shock: Secondary | ICD-10-CM

## 2023-09-13 DIAGNOSIS — Y900 Blood alcohol level of less than 20 mg/100 ml: Secondary | ICD-10-CM | POA: Diagnosis not present

## 2023-09-13 DIAGNOSIS — E875 Hyperkalemia: Secondary | ICD-10-CM | POA: Diagnosis not present

## 2023-09-13 DIAGNOSIS — X58XXXA Exposure to other specified factors, initial encounter: Secondary | ICD-10-CM | POA: Diagnosis present

## 2023-09-13 DIAGNOSIS — L0390 Cellulitis, unspecified: Secondary | ICD-10-CM | POA: Diagnosis present

## 2023-09-13 DIAGNOSIS — F22 Delusional disorders: Secondary | ICD-10-CM

## 2023-09-13 DIAGNOSIS — L97529 Non-pressure chronic ulcer of other part of left foot with unspecified severity: Secondary | ICD-10-CM | POA: Diagnosis not present

## 2023-09-13 DIAGNOSIS — Z79899 Other long term (current) drug therapy: Secondary | ICD-10-CM | POA: Diagnosis not present

## 2023-09-13 DIAGNOSIS — G9341 Metabolic encephalopathy: Secondary | ICD-10-CM | POA: Diagnosis present

## 2023-09-13 DIAGNOSIS — M6282 Rhabdomyolysis: Secondary | ICD-10-CM | POA: Diagnosis not present

## 2023-09-13 DIAGNOSIS — L03115 Cellulitis of right lower limb: Principal | ICD-10-CM | POA: Diagnosis present

## 2023-09-13 DIAGNOSIS — Z8673 Personal history of transient ischemic attack (TIA), and cerebral infarction without residual deficits: Secondary | ICD-10-CM | POA: Diagnosis not present

## 2023-09-13 DIAGNOSIS — M7989 Other specified soft tissue disorders: Secondary | ICD-10-CM | POA: Diagnosis not present

## 2023-09-13 DIAGNOSIS — A419 Sepsis, unspecified organism: Secondary | ICD-10-CM

## 2023-09-13 DIAGNOSIS — I10 Essential (primary) hypertension: Secondary | ICD-10-CM | POA: Diagnosis present

## 2023-09-13 DIAGNOSIS — F15129 Other stimulant abuse with intoxication, unspecified: Secondary | ICD-10-CM | POA: Diagnosis not present

## 2023-09-13 DIAGNOSIS — F17210 Nicotine dependence, cigarettes, uncomplicated: Secondary | ICD-10-CM | POA: Diagnosis not present

## 2023-09-13 DIAGNOSIS — G928 Other toxic encephalopathy: Secondary | ICD-10-CM | POA: Diagnosis present

## 2023-09-13 DIAGNOSIS — G629 Polyneuropathy, unspecified: Secondary | ICD-10-CM | POA: Diagnosis present

## 2023-09-13 DIAGNOSIS — E871 Hypo-osmolality and hyponatremia: Secondary | ICD-10-CM | POA: Diagnosis not present

## 2023-09-13 DIAGNOSIS — F1910 Other psychoactive substance abuse, uncomplicated: Secondary | ICD-10-CM | POA: Diagnosis present

## 2023-09-13 DIAGNOSIS — L03116 Cellulitis of left lower limb: Secondary | ICD-10-CM | POA: Diagnosis present

## 2023-09-13 DIAGNOSIS — Z91148 Patient's other noncompliance with medication regimen for other reason: Secondary | ICD-10-CM | POA: Diagnosis not present

## 2023-09-13 DIAGNOSIS — R410 Disorientation, unspecified: Secondary | ICD-10-CM | POA: Diagnosis not present

## 2023-09-13 DIAGNOSIS — T50995A Adverse effect of other drugs, medicaments and biological substances, initial encounter: Secondary | ICD-10-CM | POA: Diagnosis not present

## 2023-09-13 DIAGNOSIS — Z5901 Sheltered homelessness: Secondary | ICD-10-CM

## 2023-09-13 DIAGNOSIS — Z9181 History of falling: Secondary | ICD-10-CM

## 2023-09-13 DIAGNOSIS — F1010 Alcohol abuse, uncomplicated: Secondary | ICD-10-CM | POA: Diagnosis present

## 2023-09-13 DIAGNOSIS — Y929 Unspecified place or not applicable: Secondary | ICD-10-CM

## 2023-09-13 DIAGNOSIS — E46 Unspecified protein-calorie malnutrition: Secondary | ICD-10-CM | POA: Diagnosis not present

## 2023-09-13 DIAGNOSIS — L97519 Non-pressure chronic ulcer of other part of right foot with unspecified severity: Secondary | ICD-10-CM | POA: Diagnosis not present

## 2023-09-13 HISTORY — DX: Sepsis, unspecified organism: A41.9

## 2023-09-13 HISTORY — DX: Cellulitis, unspecified: L03.90

## 2023-09-13 HISTORY — DX: Delusional disorders: F22

## 2023-09-13 HISTORY — DX: Hypokalemia: E87.6

## 2023-09-13 LAB — SEDIMENTATION RATE: Sed Rate: 3 mm/h (ref 0–16)

## 2023-09-13 LAB — CBC WITH DIFFERENTIAL/PLATELET
Abs Immature Granulocytes: 0.09 10*3/uL — ABNORMAL HIGH (ref 0.00–0.07)
Basophils Absolute: 0 10*3/uL (ref 0.0–0.1)
Basophils Relative: 0 %
Eosinophils Absolute: 0.8 10*3/uL — ABNORMAL HIGH (ref 0.0–0.5)
Eosinophils Relative: 7 %
HCT: 43.7 % (ref 39.0–52.0)
Hemoglobin: 14.3 g/dL (ref 13.0–17.0)
Immature Granulocytes: 1 %
Lymphocytes Relative: 12 %
Lymphs Abs: 1.5 10*3/uL (ref 0.7–4.0)
MCH: 29.8 pg (ref 26.0–34.0)
MCHC: 32.7 g/dL (ref 30.0–36.0)
MCV: 91 fL (ref 80.0–100.0)
Monocytes Absolute: 1.3 10*3/uL — ABNORMAL HIGH (ref 0.1–1.0)
Monocytes Relative: 11 %
Neutro Abs: 8.6 10*3/uL — ABNORMAL HIGH (ref 1.7–7.7)
Neutrophils Relative %: 69 %
Platelets: 276 10*3/uL (ref 150–400)
RBC: 4.8 MIL/uL (ref 4.22–5.81)
RDW: 13.7 % (ref 11.5–15.5)
WBC: 12.3 10*3/uL — ABNORMAL HIGH (ref 4.0–10.5)
nRBC: 0 % (ref 0.0–0.2)

## 2023-09-13 LAB — COMPREHENSIVE METABOLIC PANEL
ALT: 36 U/L (ref 0–44)
AST: 48 U/L — ABNORMAL HIGH (ref 15–41)
Albumin: 3.4 g/dL — ABNORMAL LOW (ref 3.5–5.0)
Alkaline Phosphatase: 87 U/L (ref 38–126)
Anion gap: 9 (ref 5–15)
BUN: 27 mg/dL — ABNORMAL HIGH (ref 6–20)
CO2: 24 mmol/L (ref 22–32)
Calcium: 8.6 mg/dL — ABNORMAL LOW (ref 8.9–10.3)
Chloride: 101 mmol/L (ref 98–111)
Creatinine, Ser: 1.43 mg/dL — ABNORMAL HIGH (ref 0.61–1.24)
GFR, Estimated: 58 mL/min — ABNORMAL LOW (ref >60)
Glucose, Bld: 103 mg/dL — ABNORMAL HIGH (ref 70–99)
Potassium: 3.3 mmol/L — ABNORMAL LOW (ref 3.5–5.1)
Sodium: 134 mmol/L — ABNORMAL LOW (ref 135–145)
Total Bilirubin: 1.9 mg/dL — ABNORMAL HIGH (ref 0.3–1.2)
Total Protein: 7 g/dL (ref 6.5–8.1)

## 2023-09-13 LAB — CK: Total CK: 321 U/L (ref 49–397)

## 2023-09-13 LAB — TROPONIN I (HIGH SENSITIVITY)
Troponin I (High Sensitivity): 7 ng/L (ref <18)
Troponin I (High Sensitivity): 8 ng/L (ref <18)

## 2023-09-13 LAB — RAPID URINE DRUG SCREEN, HOSP PERFORMED
Amphetamines: POSITIVE — AB
Barbiturates: NOT DETECTED
Benzodiazepines: NOT DETECTED
Cocaine: NOT DETECTED
Opiates: NOT DETECTED
Tetrahydrocannabinol: NOT DETECTED

## 2023-09-13 LAB — TSH: TSH: 1.708 u[IU]/mL (ref 0.350–4.500)

## 2023-09-13 LAB — C-REACTIVE PROTEIN: CRP: 2.1 mg/dL — ABNORMAL HIGH (ref <1.0)

## 2023-09-13 LAB — ETHANOL: Alcohol, Ethyl (B): <10 mg/dL (ref <10)

## 2023-09-13 MED ORDER — THIAMINE HCL 100 MG/ML IJ SOLN: 100.0000 mg | Freq: Every day | INTRAMUSCULAR | Status: DC

## 2023-09-13 MED ORDER — THIAMINE MONONITRATE 100 MG PO TABS
100.0000 mg | ORAL_TABLET | Freq: Every day | ORAL | Status: DC
  Administered 2023-09-14 – 2023-09-17 (×4): 100 mg via ORAL
  Filled 2023-09-13 (×4): qty 1

## 2023-09-13 MED ORDER — POTASSIUM CHLORIDE CRYS ER 20 MEQ PO TBCR
40.0000 meq | EXTENDED_RELEASE_TABLET | Freq: Once | ORAL | Status: AC
  Administered 2023-09-13: 40 meq via ORAL
  Filled 2023-09-13: qty 2

## 2023-09-13 MED ORDER — LORAZEPAM 1 MG PO TABS: 1.0000 mg | ORAL_TABLET | ORAL | Status: AC | PRN

## 2023-09-13 MED ORDER — LACTATED RINGERS IV BOLUS
1000.0000 mL | Freq: Once | INTRAVENOUS | Status: AC
  Administered 2023-09-13: 1000 mL via INTRAVENOUS

## 2023-09-13 MED ORDER — FOLIC ACID 1 MG PO TABS
1.0000 mg | ORAL_TABLET | Freq: Every day | ORAL | Status: DC
  Administered 2023-09-14 – 2023-09-17 (×4): 1 mg via ORAL
  Filled 2023-09-13 (×4): qty 1

## 2023-09-13 MED ORDER — ADULT MULTIVITAMIN W/MINERALS CH
1.0000 | ORAL_TABLET | Freq: Every day | ORAL | Status: DC
  Administered 2023-09-14 – 2023-09-17 (×4): 1 via ORAL
  Filled 2023-09-13 (×5): qty 1

## 2023-09-13 MED ORDER — SODIUM CHLORIDE 0.9 % IV SOLN
2.0000 g | Freq: Two times a day (BID) | INTRAVENOUS | Status: DC
  Administered 2023-09-14: 2 g via INTRAVENOUS
  Filled 2023-09-13: qty 12.5

## 2023-09-13 MED ORDER — ENOXAPARIN SODIUM 40 MG/0.4ML IJ SOSY
40.0000 mg | PREFILLED_SYRINGE | INTRAMUSCULAR | Status: DC
  Administered 2023-09-13 – 2023-09-16 (×4): 40 mg via SUBCUTANEOUS
  Filled 2023-09-13 (×4): qty 0.4

## 2023-09-13 MED ORDER — LABETALOL HCL 5 MG/ML IV SOLN
5.0000 mg | INTRAVENOUS | Status: DC | PRN
  Administered 2023-09-16 – 2023-09-17 (×2): 5 mg via INTRAVENOUS
  Filled 2023-09-13 (×3): qty 4

## 2023-09-13 MED ORDER — VANCOMYCIN HCL 1500 MG/300ML IV SOLN
1500.0000 mg | Freq: Once | INTRAVENOUS | Status: AC
  Administered 2023-09-13: 1500 mg via INTRAVENOUS
  Filled 2023-09-13: qty 300

## 2023-09-13 MED ORDER — VANCOMYCIN HCL 750 MG/150ML IV SOLN
750.0000 mg | Freq: Two times a day (BID) | INTRAVENOUS | Status: DC
  Administered 2023-09-14: 750 mg via INTRAVENOUS
  Filled 2023-09-13 (×2): qty 150

## 2023-09-13 MED ORDER — SODIUM CHLORIDE 0.9 % IV SOLN
2.0000 g | Freq: Once | INTRAVENOUS | Status: AC
  Administered 2023-09-13: 2 g via INTRAVENOUS
  Filled 2023-09-13: qty 12.5

## 2023-09-13 NOTE — Assessment & Plan Note (Signed)
-  UDS positive for amphetamine on presentation -has hx of cocaine and alcohol use -place on CIWA protocol

## 2023-09-13 NOTE — Assessment & Plan Note (Signed)
-  uncontrolled. Likely from amphetamine use -PRN IV Labetalol with perimeters

## 2023-09-13 NOTE — Assessment & Plan Note (Signed)
Oral supplementation

## 2023-09-13 NOTE — ED Notes (Signed)
Pt taken to shower then to be placed into blue scrubs

## 2023-09-13 NOTE — Assessment & Plan Note (Addendum)
-  Pt oriented to self and place. Unable to provide any meaningful hx. Likely secondary to acute on chronic drug use and underlying psychiatric disorder. UDS +for amphetamines

## 2023-09-13 NOTE — ED Provider Notes (Signed)
[place] EMERGENCY DEPARTMENT AT [facility] Provider Note  CSN: [id number] Arrival date & time: 09/13/23 1147  Chief Complaint(s) Altered Mental Status  HPI Tyler [name] is a 52 y.o. male with PMH polysubstance abuse, previous hospital admissions for rhabdomyolysis and cerebellar infarcts, alcohol abuse who presents emergency department for evaluation of altered mental status.  Patient arrives via IVC by police after [facility].  He was found to have wounds to bilateral lower extremities with erythema, increased agitation, paranoid delusions.  He arrives alert and oriented x 2 but does believe that it is 1979.  Currently denies suicidal or homicidal ideation.  Additional history unable to be obtained secondary to patient's altered mental status.   Past Medical History Past Medical History:  Diagnosis Date   Alcohol abuse    Back pain    Hypertension    Kidney disease    Patient Active Problem List   Diagnosis Date Noted   Protein-calorie malnutrition, severe 08/09/2019   Cerebral embolism with cerebral infarction 07/28/2019   Rhabdomyolysis 07/23/2019   Acute kidney failure (HCC) 07/23/2019   Acute metabolic encephalopathy 07/23/2019   Polysubstance abuse (HCC) 07/23/2019   Hyperkalemia 07/23/2019   Transaminitis 07/23/2019   HTN (hypertension) 07/23/2019   Fall 04/21/2012   Traumatic subdural hematoma (HCC) 04/21/2012   ICC 04/21/2012   Left orbit fracture (HCC) 04/21/2012   Left maxillary fracture (HCC) 04/21/2012   Alcohol use 04/21/2012   Cocaine use 04/21/2012   Home Medication(s) Prior to Admission medications   Medication Sig Start Date End Date Taking? Authorizing Provider  acetaminophen (TYLENOL) 325 MG tablet Take 2 tablets (650 mg total) by mouth every 4 (four) hours as needed for mild pain (or Fever >/= 101). 12/16/19   Zannie Cove, MD  atorvastatin (LIPITOR) 20 MG tablet Take 1 tablet (20 mg  total) by mouth daily at 6 PM. 12/16/19   Zannie Cove, MD  lactulose (CHRONULAC) 10 GM/15ML solution Take 30 mLs (20 g total) by mouth daily. 12/16/19   Zannie Cove, MD  metoprolol tartrate (LOPRESSOR) 50 MG tablet Take 1 tablet (50 mg total) by mouth 2 (two) times daily. 12/16/19   Zannie Cove, MD  pantoprazole (PROTONIX) 40 MG tablet Take 1 tablet (40 mg total) by mouth daily. 12/17/19   Zannie Cove, MD                                                                                                                                    Past Surgical History Past Surgical History:  Procedure Laterality Date   MANDIBLE SURGERY     NERVE, TENDON AND ARTERY REPAIR Left 10/07/2014   Procedure: ORIF PROXIMAL THUMB REPAIR OF EPL AND FLP TENDON MICROSCOPIC NERVE, TENDON AND RADIAL ARTERY REPAIR WITH WOUND EXPLORATION;  Surgeon: Dairl Ponder, MD;  Location: [facility] OR;  Service: Orthopedics;  Laterality: Left;  OEC, MICROSCOPE   Family  History History reviewed. No pertinent family history.  Social History Social History   Tobacco Use   Smoking status: Every Day    Current packs/day: 1.00    Types: Cigarettes   Smokeless tobacco: Current  Substance Use Topics   Alcohol use: Not Currently    Alcohol/week: 2.0 standard drinks of alcohol    Types: 2 Cans of beer per week   Drug use: Not Currently   Allergies Patient has no known allergies.  Review of Systems Review of Systems  Unable to perform ROS: Mental status change    Physical Exam Vital Signs  I have reviewed the triage vital signs BP (!) 158/106   Pulse 98   Temp 98.5 F (36.9 C) (Oral)   Resp 19   Ht 5\' 8"  (1.727 m)   Wt 74.8 kg   SpO2 100%   BMI 25.09 kg/m   Physical Exam Constitutional:      General: He is not in acute distress.    Appearance: Normal appearance.  HENT:     Head: Normocephalic and atraumatic.     Nose: No congestion or rhinorrhea.  Eyes:     General:        Right eye: No discharge.         Left eye: No discharge.     Extraocular Movements: Extraocular movements intact.     Pupils: Pupils are equal, round, and reactive to light.  Cardiovascular:     Rate and Rhythm: Normal rate and regular rhythm.     Heart sounds: No murmur heard. Pulmonary:     Effort: No respiratory distress.     Breath sounds: No wheezing or rales.  Abdominal:     General: There is no distension.     Tenderness: There is no abdominal tenderness.  Musculoskeletal:        General: Normal range of motion.     Cervical back: Normal range of motion.  Skin:    General: Skin is warm and dry.     Findings: Erythema and lesion present.  Neurological:     General: No focal deficit present.     Mental Status: He is alert. He is disoriented.     ED Results and Treatments Labs (all labs ordered are listed, but only abnormal results are displayed) Labs Reviewed  COMPREHENSIVE METABOLIC PANEL - Abnormal; Notable for the following components:      Result Value   Sodium 134 (*)    Potassium 3.3 (*)    Glucose, Bld 103 (*)    BUN 27 (*)    Creatinine, Ser 1.43 (*)    Calcium 8.6 (*)    Albumin 3.4 (*)    AST 48 (*)    Total Bilirubin 1.9 (*)    GFR, Estimated 58 (*)    All other components within normal limits  CBC WITH DIFFERENTIAL/PLATELET - Abnormal; Notable for the following components:   WBC 12.3 (*)    Neutro Abs 8.6 (*)    Monocytes Absolute 1.3 (*)    Eosinophils Absolute 0.8 (*)    Abs Immature Granulocytes 0.09 (*)    All other components within normal limits  CK  TSH  CBC WITH DIFFERENTIAL/PLATELET  RAPID URINE DRUG SCREEN, HOSP PERFORMED  ETHANOL  SEDIMENTATION RATE  C-REACTIVE PROTEIN  TROPONIN I (HIGH SENSITIVITY)  TROPONIN I (HIGH SENSITIVITY)  Radiology CT Head Wo Contrast  Result Date: 09/13/2023 CLINICAL DATA:  Delirium. EXAM: CT HEAD WITHOUT  CONTRAST TECHNIQUE: Contiguous axial images were obtained from the base of the skull through the vertex without intravenous contrast. RADIATION DOSE REDUCTION: This exam was performed according to the departmental dose-optimization program which includes automated exposure control, adjustment of the mA and/or kV according to patient size and/or use of iterative reconstruction technique. COMPARISON:  Head CT 03/01/2021. FINDINGS: Brain: No acute hemorrhage. Stable severe chronic small-vessel disease with old infarct in the right temporal lobe and old perforator infarcts in the bilateral basal ganglia. No new loss of gray-white differentiation. Age advanced volume loss. No extra-axial collection, mass effect or shift. Vascular: No hyperdense vessel or unexpected calcification. Skull: No calvarial fracture or suspicious bone lesion. Skull base is unremarkable. Sinuses/Orbits: No acute finding. Other: None. IMPRESSION: 1. No acute intracranial abnormality. 2. 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 3. Age advanced volume loss. Electronically Signed   By: Orvan Falconer M.D.   On: 09/13/2023 13:39    Pertinent labs & imaging results that were available during my care of the patient were reviewed by me and considered in my medical decision making (see MDM for details).  Medications Ordered in ED Medications  vancomycin (VANCOREADY) IVPB 1500 mg/300 mL (has no administration in time range)  ceFEPIme (MAXIPIME) 2 g in sodium chloride 0.9 % 100 mL IVPB (has no administration in time range)                                                                                                                                     Procedures Procedures  (including critical care time)  Medical Decision Making / ED Course   This patient presents to the ED for concern of altered mental status, foot wounds, this involves an extensive number of  treatment options, and is a complaint that carries with it a high risk of complications and morbidity.  The differential diagnosis includes infection, metabolic/toxic encephalopathy, hypoglycemia, malperfusion, hypoxia, trauma or other intracranial process  MDM: Patient seen emergency room for evaluation of altered mental status.  Physical exam with swelling of bilateral feet with associated erythema and foot wounds.  Also does have an abrasion to the posterior occiput of which the patient cannot explain.  Laboratory evaluation with leukocytosis to 12.3, potassium 3.3, sodium 134, BUN 27, creatinine 1.43.  UDS positive for amphetamines.  CT imaging reassuringly unremarkable for acute traumatic injury.  Broad-spectrum antibiotics started for patient's bilateral lower extremity foot wounds.  Patient pending x-ray imaging of bilateral lower extremities and inflammatory markers to rule out osteomyelitis.  Anticipate admission for persistent altered mental status and lower extremity cellulitis/foot wounds.  Please see provider signout for continuation of workup.   Additional history obtained: -Additional history obtained from police -External records from outside source obtained and reviewed including: Chart  review including previous notes, labs, imaging, consultation notes   Lab Tests: -I ordered, reviewed, and interpreted labs.   The pertinent results include:   Labs Reviewed  COMPREHENSIVE METABOLIC PANEL - Abnormal; Notable for the following components:      Result Value   Sodium 134 (*)    Potassium 3.3 (*)    Glucose, Bld 103 (*)    BUN 27 (*)    Creatinine, Ser 1.43 (*)    Calcium 8.6 (*)    Albumin 3.4 (*)    AST 48 (*)    Total Bilirubin 1.9 (*)    GFR, Estimated 58 (*)    All other components within normal limits  CBC WITH DIFFERENTIAL/PLATELET - Abnormal; Notable for the following components:   WBC 12.3 (*)    Neutro Abs 8.6 (*)    Monocytes Absolute 1.3 (*)    Eosinophils  Absolute 0.8 (*)    Abs Immature Granulocytes 0.09 (*)    All other components within normal limits  CK  TSH  CBC WITH DIFFERENTIAL/PLATELET  RAPID URINE DRUG SCREEN, HOSP PERFORMED  ETHANOL  SEDIMENTATION RATE  C-REACTIVE PROTEIN  TROPONIN I (HIGH SENSITIVITY)  TROPONIN I (HIGH SENSITIVITY)      Imaging Studies ordered: I ordered imaging studies including CT head I independently visualized and interpreted imaging. I agree with the radiologist interpretation  X-ray of bilateral feet are pending   Medicines ordered and prescription drug management: Meds ordered this encounter  Medications   vancomycin (VANCOREADY) IVPB 1500 mg/300 mL    Order Specific Question:   Indication:    Answer:   Osteomyelitis   ceFEPIme (MAXIPIME) 2 g in sodium chloride 0.9 % 100 mL IVPB    Order Specific Question:   Antibiotic Indication:    Answer:   Osteomyelitis    -I have reviewed the patients home medicines and have made adjustments as needed  Critical interventions none    Cardiac Monitoring: The patient was maintained on a cardiac monitor.  I personally viewed and interpreted the cardiac monitored which showed an underlying rhythm of: NSR  Social Determinants of Health:  Factors impacting patients care include: Homeless   Reevaluation: After the interventions noted above, I reevaluated the patient and found that they have :stayed the same  Co morbidities that complicate the patient evaluation  Past Medical History:  Diagnosis Date   Alcohol abuse    Back pain    Hypertension    Kidney disease       Dispostion: I considered admission for this patient, and disposition pending completion of laboratory evaluation and imaging studies.  Please see provider signout reticulation workup.     Final Clinical Impression(s) / ED Diagnoses Final diagnoses:  None     @PCDICTATION @    Glendora Score, MD 09/13/23 2141

## 2023-09-13 NOTE — Progress Notes (Signed)
Pharmacy Antibiotic Note  Tyler [name] is a 52 y.o. male admitted on 09/13/2023 with sepsis due to cellulitis of bilateral feet.  Pharmacy has been consulted for Vancomycin and Cefepime dosing.  Plan: Vancomycin 750 mg IV Q 12 hrs. Goal AUC 400-550.  Expected AUC: 537.5  SCr used: 1.43 Cefepime 2g IV q12h Follow renal function   Height: 5\' 8"  (172.7 cm) Weight: 74.8 kg (165 lb) IBW/kg (Calculated) : 68.4  Temp (24hrs), Avg:98.6 F (37 C), Min:98.4 F (36.9 C), Max:98.8 F (37.1 C)  Recent Labs  Lab 09/13/23 1251 09/13/23 1325  WBC  --  12.3*  CREATININE 1.43*  --     Estimated Creatinine Clearance: 57.1 mL/min (A) (by C-G formula based on SCr of 1.43 mg/dL (H)).    No Known Allergies  Antimicrobials this admission: 10/8 Cefepime >>   10/8 Vancomycin >>    Dose adjustments this admission:    Microbiology results:    Thank you for allowing pharmacy to be a part of this patient's care.  Maryellen Pile, PharmD 09/13/2023 11:05 PM

## 2023-09-13 NOTE — H&P (Addendum)
History and Physical    Patient: Tyler [name] WGN:[id number] DOB: May 23, 1971 DOA: 09/13/2023 DOS: the patient was seen and examined on 09/13/2023 PCP: Patient, No Pcp Per  Patient coming from: Home  Chief Complaint:  Chief Complaint  Patient presents with   Altered Mental Status   HPI: Tyler [name] is a 52 y.o. male with medical history significant of HTN, CVA, traumatic subdural hematoma, polysubstance abuse who was brought in under IVC by police. He [facility] a [facility] and has been outside since last night. He was alerted but paranoid.  He was found to have bed bugs and had bilateral feet edema.   Pt oriented to self and place. Says he lived with his son in [place] sometime ago but he put him out in the streets. He denies tobacco, alcohol or illicit drug use.   X-ray of bilateral foot with mild to moderate predominantly dorsal forefoot soft tissue swelling. Mild to moderate medial and mild lateral malleolar ankle soft tissue of the right foot. No evidence of osteomyelitis.   He was otherwise afebrile, HR 98, bp 158/106.   CBC with leukocytosis of 12K.  BMP notable for hyponatremia of 134, hypokalemia of 3.3. AKI with creatinine of 1.43 with remote prior around 1.3.   UDS positive for amphetamine. ETOH <10.   Review of Systems: unable to review all systems due to the inability of the patient to answer questions. Past Medical History:  Diagnosis Date   Alcohol abuse    Back pain    Hypertension    Kidney disease    Past Surgical History:  Procedure Laterality Date   MANDIBLE SURGERY     NERVE, TENDON AND ARTERY REPAIR Left 10/07/2014   Procedure: ORIF PROXIMAL THUMB REPAIR OF EPL AND FLP TENDON MICROSCOPIC NERVE, TENDON AND RADIAL ARTERY REPAIR WITH WOUND EXPLORATION;  Surgeon: Dairl Ponder, MD;  Location: [facility] OR;  Service: Orthopedics;  Laterality: Left;  OEC, MICROSCOPE   Social History:  reports that he has been smoking cigarettes. He uses  smokeless tobacco. He reports that he does not currently use alcohol after a past usage of about 2.0 standard drinks of alcohol per week. He reports that he does not currently use drugs.  No Known Allergies  History reviewed. No pertinent family history.  Prior to Admission medications   Medication Sig Start Date End Date Taking? Authorizing Provider  acetaminophen (TYLENOL) 325 MG tablet Take 2 tablets (650 mg total) by mouth every 4 (four) hours as needed for mild pain (or Fever >/= 101). 12/16/19   Zannie Cove, MD  atorvastatin (LIPITOR) 20 MG tablet Take 1 tablet (20 mg total) by mouth daily at 6 PM. 12/16/19   Zannie Cove, MD  lactulose (CHRONULAC) 10 GM/15ML solution Take 30 mLs (20 g total) by mouth daily. 12/16/19   Zannie Cove, MD  metoprolol tartrate (LOPRESSOR) 50 MG tablet Take 1 tablet (50 mg total) by mouth 2 (two) times daily. 12/16/19   Zannie Cove, MD  pantoprazole (PROTONIX) 40 MG tablet Take 1 tablet (40 mg total) by mouth daily. 12/17/19   Zannie Cove, MD    Physical Exam: Vitals:   09/13/23 1457 09/13/23 1736 09/13/23 1918 09/13/23 2015  BP: (!) 168/107  115/82 (!) 140/82  Pulse: 98  96 94  Resp: 18  18 20   Temp:  98.4 F (36.9 C) 98.6 F (37 C) 98.8 F (37.1 C)  TempSrc:  Oral  Oral  SpO2: 100%  97% 98%  Weight:  Height:       Constitutional: NAD, calm, comfortable, disheveled male appearing much older than state age laying in bed asleep Eyes: lids and conjunctivae normal ENMT: Mucous membranes are moist.  Neck: normal, supple Respiratory: clear to auscultation bilaterally, no wheezing, no crackles. Normal respiratory effort. No accessory muscle use.  Cardiovascular: Regular rate and rhythm, no murmurs / rubs / gallops. Non-pitting edema of bilateral feet Abdomen: no tenderness, soft Musculoskeletal: no clubbing / cyanosis. No joint deformity upper and lower extremities. Normal muscle tone.  Skin: erythema and edema of bilateral dorsal  foot with several ulcerated scab wounds-largest one is to left medial plantar foot.  Neurologic: CN 2-12 grossly intact. Oriented only to self and place. Able to follow commands.  Psychiatric: calm mood.   Data Reviewed:  See HPI  Assessment and Plan: * Sepsis (HCC) -Secondary to cellulitis  -as evidence with tachycardia and leukocytosis -edema and erythema of bilateral feet -no findings of osteomyelitis on X-ray -continue IV Vancomycin and Cefepime  Hypokalemia Oral supplementation  Paranoid (HCC) -reportedly had paranoia on presentation -current IVC -Psychiatry consulted  HTN (hypertension) -uncontrolled. Likely from amphetamine use -PRN IV Labetalol with perimeters   Polysubstance abuse (HCC) -UDS positive for amphetamine on presentation -has hx of cocaine and alcohol use -place on CIWA protocol  Acute metabolic encephalopathy -Pt oriented to self and place. Unable to provide any meaningful hx. Likely secondary to acute on chronic drug use and underlying psychiatric disorder. UDS +for amphetamines      Advance Care Planning:Full  Consults: none  Family Communication: none at bedside  Severity of Illness: The appropriate patient status for this patient is INPATIENT. Inpatient status is judged to be reasonable and necessary in order to provide the required intensity of service to ensure the patient's safety. The patient's presenting symptoms, physical exam findings, and initial radiographic and laboratory data in the context of their chronic comorbidities is felt to place them at high risk for further clinical deterioration. Furthermore, it is not anticipated that the patient will be medically stable for discharge from the hospital within 2 midnights of admission.   * I certify that at the point of admission it is my clinical judgment that the patient will require inpatient hospital care spanning beyond 2 midnights from the point of admission due to high intensity of  service, high risk for further deterioration and high frequency of surveillance required.*  Author: Anselm Jungling, DO 09/13/2023 9:57 PM  For on call review [email or web address].

## 2023-09-13 NOTE — ED Triage Notes (Signed)
Patient brought in under IVC by police. [facility] staying [facility] and has been outside since last night. Is altered and paranoid. Patient thinks it is 9. Denies suicidal or homicidal thoughts. Patient complaining of swollen feet.

## 2023-09-13 NOTE — Assessment & Plan Note (Signed)
-  Secondary to cellulitis  -as evidence with tachycardia and leukocytosis -edema and erythema of bilateral feet -no findings of osteomyelitis on X-ray -continue IV Vancomycin and Cefepime

## 2023-09-13 NOTE — Assessment & Plan Note (Signed)
-  reportedly had paranoia on presentation -current IVC -Psychiatry consulted

## 2023-09-13 NOTE — ED Provider Notes (Signed)
  Physical Exam  BP (!) 168/107 (BP Location: Right Arm)   Pulse 98   Temp 98.4 F (36.9 C) (Oral)   Resp 18   Ht 5\' 8"  (1.727 m)   Wt 74.8 kg   SpO2 100%   BMI 25.09 kg/m   Physical Exam  Procedures  Procedures  ED Course / MDM    Medical Decision Making Care assumed at 3 pm. Patient is IVC by police for altered mental status. Patient has bilateral foot ulcers. Also had bed bugs. Sign out pending labs and bilateral foot xrays   6:35 PM Reviewed patient's labs and WBC is 12. Xrays showed no osteo. Given broad spectrum abx. Psychiatry consulted since he is under IVC. Hospitalist to admit for foot ulcers and leg cellulitis.   Problems Addressed: Bilateral lower leg cellulitis: acute illness or injury  Amount and/or Complexity of Data Reviewed Labs: ordered. Decision-making details documented in ED Course. Radiology: ordered.  Risk Prescription drug management.          Charlynne Pander, MD 09/13/23 [phone]

## 2023-09-13 NOTE — Assessment & Plan Note (Deleted)
-  edema and erythema of bilateral feet -no findings of osteomyelitis on X-ray -continue IV Vancomycin and Cefepime

## 2023-09-13 NOTE — Progress Notes (Signed)
A consult was received from an ED physician for vanc/cefepime per pharmacy dosing.  The patient's profile has been reviewed for ht/wt/allergies/indication/available labs.   A one time order has been placed for vanc 1500mg  and cefepime 2g.  Further antibiotics/pharmacy consults should be ordered by admitting physician if indicated.                       Thank you, Berkley Harvey 09/13/2023  1:39 PM

## 2023-09-14 DIAGNOSIS — F22 Delusional disorders: Secondary | ICD-10-CM | POA: Diagnosis not present

## 2023-09-14 DIAGNOSIS — A419 Sepsis, unspecified organism: Secondary | ICD-10-CM | POA: Diagnosis not present

## 2023-09-14 DIAGNOSIS — R6520 Severe sepsis without septic shock: Secondary | ICD-10-CM | POA: Diagnosis not present

## 2023-09-14 DIAGNOSIS — G9341 Metabolic encephalopathy: Secondary | ICD-10-CM | POA: Diagnosis not present

## 2023-09-14 DIAGNOSIS — F1910 Other psychoactive substance abuse, uncomplicated: Secondary | ICD-10-CM | POA: Diagnosis not present

## 2023-09-14 LAB — CBC
HCT: 35.7 % — ABNORMAL LOW (ref 39.0–52.0)
Hemoglobin: 11.6 g/dL — ABNORMAL LOW (ref 13.0–17.0)
MCH: 29.8 pg (ref 26.0–34.0)
MCHC: 32.5 g/dL (ref 30.0–36.0)
MCV: 91.8 fL (ref 80.0–100.0)
Platelets: 184 10*3/uL (ref 150–400)
RBC: 3.89 MIL/uL — ABNORMAL LOW (ref 4.22–5.81)
RDW: 13.8 % (ref 11.5–15.5)
WBC: 7 10*3/uL (ref 4.0–10.5)
nRBC: 0 % (ref 0.0–0.2)

## 2023-09-14 LAB — BASIC METABOLIC PANEL
Anion gap: 8 (ref 5–15)
BUN: 21 mg/dL — ABNORMAL HIGH (ref 6–20)
CO2: 25 mmol/L (ref 22–32)
Calcium: 8.3 mg/dL — ABNORMAL LOW (ref 8.9–10.3)
Chloride: 105 mmol/L (ref 98–111)
Creatinine, Ser: 1.28 mg/dL — ABNORMAL HIGH (ref 0.61–1.24)
GFR, Estimated: >60 mL/min (ref >60)
Glucose, Bld: 98 mg/dL (ref 70–99)
Potassium: 3.6 mmol/L (ref 3.5–5.1)
Sodium: 138 mmol/L (ref 135–145)

## 2023-09-14 LAB — HIV ANTIBODY (ROUTINE TESTING W REFLEX): HIV Screen 4th Generation wRfx: NONREACTIVE

## 2023-09-14 MED ORDER — DOXYCYCLINE HYCLATE 100 MG PO TABS
100.0000 mg | ORAL_TABLET | Freq: Two times a day (BID) | ORAL | Status: DC
  Administered 2023-09-14 – 2023-09-17 (×7): 100 mg via ORAL
  Filled 2023-09-14 (×7): qty 1

## 2023-09-14 MED ORDER — CEFADROXIL 500 MG PO CAPS
500.0000 mg | ORAL_CAPSULE | Freq: Two times a day (BID) | ORAL | Status: DC
  Administered 2023-09-14 – 2023-09-17 (×6): 500 mg via ORAL
  Filled 2023-09-14 (×6): qty 1

## 2023-09-14 MED ORDER — SODIUM CHLORIDE 0.9 % IV SOLN
2.0000 g | Freq: Three times a day (TID) | INTRAVENOUS | Status: DC
  Administered 2023-09-14: 2 g via INTRAVENOUS
  Filled 2023-09-14: qty 12.5

## 2023-09-14 NOTE — Consult Note (Addendum)
[facility]iat[facility] New Face-to-Face Psychiatric Evaluation   Service Date: September 14, 2023 LOS:  LOS: 1 day    Assessment  Tyler [name] is a 52 y.o. male admitted medically for 09/13/2023 11:52 AM for sepsis. He carries no prior psychiatric hx and has a past medical history of polysubstance abuse,  Etoh use d/o, hepatic steatosis, bilateral cerebellar infarcts, rhabdomyolysis, Acute metabolic encephalopathy, hyperkalemia, transaminitis, HTN, AKI, and malnutrition.Psychiatry was consulted for "paranoia and IVC" by Chaney Malling, MD.    His current presentation of reported paranoia and AMS is most consistent with intoxication of methamphetamine and hx of cognitive deficits residual from CVA and co morbid Etoh use d/o and polysubstance abuse. He meets criteria for Stimulant intoxication based on paranoia reported at presentation that appears to have decreased with time to metabolize agent found in urine.  Patient has no documented psychiatric hx. Please see plan below for detailed recommendations.   Based on initial psychiatric assessment today, patient does not continue to endorse symptoms of paranoia that others were following him. Patient reported paranoia on arrival was likely related to his meth use that was confirmed by meth in his UDS. Meth can induce paranoia and paranoid behaviors as a side effect. As patient has had more time to metabolize this and rest, this side effects likely improved. Patent also had visible head trauma when he arrived for presentation.  Patient does have a hx approx 3 years ago of a 4 mon long hospitalization with CVA, polysubstance abuse, malnutrition Etoh use d/o, encephalopathy. Most recent CT notes that patient has advanced age volume loss, severe small vessel disease and residual old infarcts from his CVA, suggesting that patient's cognitive function is impaired at baseline. At time of hospitalization in 2021, there was concern for patient ability to care for  self due to cognitive deficits, leading to attempting to have DSS assist with guardianship and placement, prolonging hospitalization.     Diagnoses:  Active Hospital problems: Principal Problem:   Sepsis (HCC) Active Problems:   Acute metabolic encephalopathy   Polysubstance abuse (HCC)   HTN (hypertension)   Wound cellulitis   Paranoid (HCC)   Hypokalemia     Plan  ## Safety and Observation Level:  - Based on my clinical evaluation, I estimate the patient to be at mild risk of self harm in the current setting, and is IVC'd in current setting - At this time, we recommend a 1:1 level of observation. This decision is based on my review of the chart including patient's history and current presentation, interview of the patient, mental status examination, and consideration of suicide risk including evaluating suicidal ideation, plan, intent, suicidal or self-harm behaviors, risk factors, and protective factors. This judgment is based on our ability to directly address suicide risk, implement suicide prevention strategies and develop a safety plan while the patient is in the clinical setting. Please contact our team if there is a concern that risk level has changed.   ## Medications:  --No psychotropic meds recommended at this time Dx- Stimulant abuse  ## Medical Decision Making Capacity:  Patient does not appear to have the ability to assent to make decisions about his medical care. Patient did not have the ability to recall autobiographical memories, that are recorded in his chart as of 3 years ago,  nor recall statements in made within 3 minutes before forgetting that he made them. There has been documentation in the past that patient's cognitive debilities impact his ability to care for self.   ##  Further Work-up:   -- recommend TOC consult due to hx of difficult placement and concern for ability to care for self -- Consider Thiamine given hx of Etoh use d/o -- Recommend PT  eval   -- most recent EKG on 09/14/2023 had QtC of 470 w/ HR of 83, NSR -- Pertinent labwork reviewed earlier this admission includes:     Latest Ref Rng & Units 09/14/2023    5:39 AM 09/13/2023   12:51 PM 12/05/2019   12:20 AM  CMP  Glucose 70 - 99 mg/dL 98  562  130   BUN 6 - 20 mg/dL 21  27  23    Creatinine 0.61 - 1.24 mg/dL 8.65  7.84  6.96   Sodium 135 - 145 mmol/L 138  134  139   Potassium 3.5 - 5.1 mmol/L 3.6  3.3  3.8   Chloride 98 - 111 mmol/L 105  101  107   CO2 22 - 32 mmol/L 25  24  19    Calcium 8.9 - 10.3 mg/dL 8.3  8.6  9.1   Total Protein 6.5 - 8.1 g/dL  7.0    Total Bilirubin 0.3 - 1.2 mg/dL  1.9    Alkaline Phos 38 - 126 U/L  87    AST 15 - 41 U/L  48    ALT 0 - 44 U/L  36     HIV: negative Trp I: negative CRP: 2.1 UDS: + meth Etoh: (-) CK: 321 09/13/2023, CT: Head- Impression:  1. No acute intracranial abnormality. 2. 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 3. Age advanced volume loss.  ## Disposition:  -- Remains on floor for treatment of sepsis, There is no indication for inpatient psychiatry at this time.   ## Behavioral / Environmental:  -- DELIRIUM RECS 1: Avoid benzodiazepines, antihistamines, anticholinergics, and minimize opiate use as these may worsen delirium. 2:Assess, prevent and manage pain as lack of treatment can result in delirium.  3: Recommend consult to PT/OT if not already done. Early mobility and exercise has been shown to decrease duration of delirium.  4:Provide appropriate lighting and clear signage; a clock and calendar should be easily visible to the patient. 5:Monitor environmental factors. Reduce light and noise at night (close shades, turn off lights, turn off TV, ect). Correct any alterations in sleep cycle. 6: Reorient the patient to person, place, time and situation on each encounter.  7: Correct sensory deficits if possible (replace eye glasses,  hearing aids, ect). 8: Avoid restraints. Severely delirious patients benefit from constant observation by a sitter. 9: Do not leave patient unattended.     ##Legal Status IVC'd It appears reasonable to uphold IVC while pt receiving life saving antibiotics therefore, we will defer rescinding of IVC to medical team. Based on current assessment there does not appear to be a psychiatric indication to uphold, given there is no evidence of paranoia or psychosis on exam today.   Thank you for this consult request. Recommendations have been communicated to the primary team.  We will sign off at this time.   Bobbye Morton, MD   NEW history  Relevant Aspects of Hospital Course:  Admitted on 09/13/2023 for sepsis.  Patient Report:  On patient report today, patient gives his best guess that he [place] in the [place], but is endorses that he has no idea what month or year it is. Patient reports that he does not remember much, he  does report that he has been staying at a facility "that the open up every night and you can go in and sleep." Patient reports that to his knowledge he normally gets along with others, and is not sure if he has any issues with anyone at the facility. Patient reports that's he does not used any "drugs" when asked about substance use. He continues to deny meth use, despite provider endorsing that it was found in his urine. Patient reports that he has no idea how it was in his urine. When provider asked if patient has ever used substances, patient denies twice any substance use hx. Patient then reports that he was in [place] and left when he got in a argument with his son and caught a ride out of town. Patient reports he cannot remember how long ago it was when he returned to [facility] or when the argument with his son was. Patient reports that to his knowledge he has never seen a psychiatrist, and denies feeling down, depressed or hopeless recently. Patient denies SI, HI, and AVH. Patient denies  any hx of SA. Patient denies feeling anxious and reports that to his knowledge he has been getting good sleep at the shelter. Patient endorses that he is irritated by the questions, and despite initially reporting to patient that he was staying in a shelter at night and does not think he has issues with staying there, patient became irritated when asked if when he came to the shelter, or if he interacted with others at the shelter, or where it was. Patient declared he did not know what shelter the provider was talking about. Patient asked that provider stop talking to him and asking questions. Patient denies having any questions of his won, but did endorse that team had permission to call his son. Unfortunately, there is no number and patient refused permission to contact his daughter or Celine Ahr that are documented in chart.       Psychiatric History:  None documented  Family psych history: Unknown   Social History:  Homeless currently  Tobacco use: unknown Alcohol use: denies, has documented hx of Etoh use d/o and hepatic steatosis Drug use: denies, but UDS + meth  Family History:   The patient's family history is not on file.  Medical History: Past Medical History:  Diagnosis Date   Alcohol abuse    Back pain    Hypertension    Kidney disease     Surgical History: Past Surgical History:  Procedure Laterality Date   MANDIBLE SURGERY     NERVE, TENDON AND ARTERY REPAIR Left 10/07/2014   Procedure: ORIF PROXIMAL THUMB REPAIR OF EPL AND FLP TENDON MICROSCOPIC NERVE, TENDON AND RADIAL ARTERY REPAIR WITH WOUND EXPLORATION;  Surgeon: Dairl Ponder, MD;  Location: [facility] OR;  Service: Orthopedics;  Laterality: Left;  OEC, MICROSCOPE    Medications:   Current Facility-Administered Medications:    ceFEPIme (MAXIPIME) 2 g in sodium chloride 0.9 % 100 mL IVPB, 2 g, Intravenous, Q12H, Poindexter, Leann T, RPH, Last Rate: 200 mL/hr at 09/14/23 0205, 2 g at 09/14/23 0205   enoxaparin  (LOVENOX) injection 40 mg, 40 mg, Subcutaneous, Q24H, Tu, Ching T, DO, 40 mg at 09/13/23 2222   folic acid (FOLVITE) tablet 1 mg, 1 mg, Oral, Daily, Tu, Ching T, DO, 1 mg at 09/14/23 0931   labetalol (NORMODYNE) injection 5 mg, 5 mg, Intravenous, Q2H PRN, Tu, Ching T, DO   LORazepam (ATIVAN) tablet 1-4 mg, 1-4 mg, Oral, Q4H PRN **  OR** LORazepam (ATIVAN) tablet 1 mg, 1 mg, Oral, Q4H PRN, Tu, Ching T, DO   multivitamin with minerals tablet 1 tablet, 1 tablet, Oral, Daily, Tu, Ching T, DO, 1 tablet at 09/14/23 0931   thiamine (VITAMIN B1) tablet 100 mg, 100 mg, Oral, Daily, 100 mg at 09/14/23 0931 **OR** thiamine (VITAMIN B1) injection 100 mg, 100 mg, Intravenous, Daily, Tu, Ching T, DO   vancomycin (VANCOREADY) IVPB 750 mg/150 mL, 750 mg, Intravenous, Q12H, Donell Beers, RPH, Last Rate: 150 mL/hr at 09/14/23 0632, 750 mg at 09/14/23 8657  Allergies: No Known Allergies     Objective  Vital signs:  Temp:  [98.1 F (36.7 C)-98.9 F (37.2 C)] 98.1 F (36.7 C) (10/09 0840) Pulse Rate:  [81-98] 81 (10/09 0840) Resp:  [12-20] 12 (10/09 0840) BP: (104-168)/(75-107) 120/88 (10/09 0840) SpO2:  [97 %-100 %] 97 % (10/09 0840) Weight:  [74.8 kg] 74.8 kg (10/08 1200)  Psychiatric Specialty Exam:  Presentation  General Appearance: Appropriate for Environment (under blanket)  Eye Contact:Minimal  Speech:Slurred  Speech Volume:Increased  Handedness:No data recorded  Mood and Affect  Mood:Irritable  Affect:Congruent   Thought Process  Thought Processes:Goal Directed  Descriptions of Associations:Intact  Orientation:-- (oriented to person only)  Thought Content:WDL  History of Schizophrenia/Schizoaffective disorder:No data recorded Duration of Psychotic Symptoms:No data recorded Hallucinations:Hallucinations: None  Ideas of Reference:None  Suicidal Thoughts:Suicidal Thoughts: No  Homicidal Thoughts:Homicidal Thoughts: No   Sensorium  Memory:Immediate Poor; Recent  Poor; Remote Fair  Judgment:Impaired  Insight:None   Executive Functions  Concentration:Poor  Attention Span:Poor  Recall:Poor  Fund of Knowledge:Poor  Language:Poor   Psychomotor Activity  Psychomotor Activity:Psychomotor Activity: Normal   Assets  Assets:Resilience   Sleep  Sleep:Sleep: Good    Physical Exam: Physical Exam ROS Blood pressure 120/88, pulse 81, temperature 98.1 F (36.7 C), temperature source Oral, resp. rate 12, height 5\' 8"  (1.727 m), weight 74.8 kg, SpO2 97%. Body mass index is 25.09 kg/m.

## 2023-09-14 NOTE — Progress Notes (Signed)
[facility] PROGRESS NOTE  Cadarius Nevares (DOB: 1971/07/01) ZOX:[id number] PCP: Patient, No Pcp Per  Brief Narrative: Cloyd Ragas is a 52 y.o. male with a history of CVA, traumatic SDH, polysubstance abuse, HTN who was brought to the ED by police on 09/13/2023 under IVC after displaying paranoia. UDS +amphetamines. He was found to have swollen, painful feet, WBC 12k, afebrile. Antibiotics started for cellulitis and psychiatry consulted.   Subjective: Pt without complaints this morning, when specifically asked does say both feet hurt, no fevers or chills.   Objective: BP 120/88 (BP Location: Left Arm)   Pulse 81   Temp 98.1 F (36.7 C) (Oral)   Resp 12   Ht 5\' 8"  (1.727 m)   Wt 74.8 kg   SpO2 97%   BMI 25.09 kg/m   Gen: Chronically ill-appearing male in no acute distress Pulm: Clear, nonlabored  CV: RRR, no MRG GI: Soft, NT, ND, +BS  Neuro: Alert and oriented. No new focal deficits. Ext: Warm, no deformities Skin: Feet edematous, tender with pale erythema bilaterally. Scattered eschars over superficial excoriations.   Assessment & Plan: Sepsis due to cellulitis: No osteomyelitis by XR. Wounds from excoriation noted. Healing. WBC normalized. CK wnl at 321.  - Exam showing improvement. Will deescalate to doxycyline, cefadroxil.   Paranoia, acute metabolic encephalopathy: May have been precipitated by substance(s). TSH wnl. EtOH negative, HIV NR.  - ECG for QTc. D/w psychiatry, appreciate recommendations. He is under IVC.  - Continue thiamine. - TOC consulted  Amphetamine use:  - Continue supportive care and cessation counseling.   History of CVA:  - Continue statin. Not on aspirin. Suspect due to hx SDH, ongoing fall risk, not on antiplatelet.   HTN:  - Continue prn labetalol  Hypokalemia: Supplemented, resolved.  Tyrone Nine, MD [facility] [email or web address] 09/14/2023, 1:42 PM

## 2023-09-14 NOTE — Plan of Care (Signed)
  Problem: Clinical Measurements: Goal: Will remain free from infection Outcome: Progressing   Problem: Nutrition: Goal: Adequate nutrition will be maintained Outcome: Progressing   Problem: Safety: Goal: Ability to remain free from injury will improve Outcome: Progressing   

## 2023-09-14 NOTE — Progress Notes (Signed)
Pharmacy Antibiotic Note  Tyler [name] is a 52 y.o. male admitted on 09/13/2023 with sepsis due to cellulitis of bilateral feet.  Pharmacy has been consulted for Vancomycin and Cefepime dosing.  Today, 09/14/23 WBC now WNL SCr improved slightly to 1.28, CrCl 64 mL/min  Plan: Increase cefepime to 2 g IV q8h Continue vancomycin 750 mg IV q12h for estimated AUC of 485 (goal 400-550). Check levels at steady state as needed Follow renal function   Height: 5\' 8"  (172.7 cm) Weight: 74.8 kg (165 lb) IBW/kg (Calculated) : 68.4  Temp (24hrs), Avg:98.6 F (37 C), Min:98.1 F (36.7 C), Max:98.9 F (37.2 C)  Recent Labs  Lab 09/13/23 1251 09/13/23 1325 09/14/23 0539  WBC  --  12.3* 7.0  CREATININE 1.43*  --  1.28*    Estimated Creatinine Clearance: 63.8 mL/min (A) (by C-G formula based on SCr of 1.28 mg/dL (H)).    No Known Allergies  Cindi Carbon, PharmD 09/14/2023 12:29 PM

## 2023-09-14 NOTE — Plan of Care (Signed)
  Problem: Clinical Measurements: Goal: Ability to maintain clinical measurements within normal limits will improve Outcome: Progressing   Problem: Nutrition: Goal: Adequate nutrition will be maintained Outcome: Progressing   Problem: Safety: Goal: Ability to remain free from injury will improve Outcome: Progressing   

## 2023-09-14 NOTE — TOC Initial Note (Addendum)
Transition of Care [facility]) - Initial/Assessment Note    Patient Details  Name: Tyler [name] MRN: [id number] Date of Birth: May 11, 1971  [facility] CM/SW Contact:    Otelia Santee, LCSW Phone Number: 09/14/2023, 2:40 PM  Clinical Narrative:                 [facility] consulted for safe disposition. Pt was brought in under IVC due to standing outside of shelter all night. Pt oriented x 2. CSW met with pt who was unable to provide information about where he came from or where he has been staying. Pt did provide verbal permission for CSW to call daughter.  CSW called the [facility] to determine if pt had been staying at one of their [place] shelters. Per Herma Carson, at the [facility] this pt has not been staying at any of their shelters.  CSW spoke with pt's daughter, Tyler [name], who shares that pt has been homeless and has been sleeping on the streets. She shares that when pt initially had his stroke she had began paperwork for guardianship however, pt progressed to being verbal and oriented and refused for guardianship or POA paperwork to be completed. She shares she has tried to assist pt with applying for disability income however, he will not go to doctors appointments to be evaluated. He refuses placement and refuses to go to shelters that she has found for him. She shares he is unable to stay with her due to her having young kids and him actively using substances.  CSW left voicemail with [facility] APS to file a report. Will file report once call returned.  ADDENDUM: APS report completed. Will await determination.   Expected Discharge Plan:  (TBD) Barriers to Discharge: Homeless with medical needs, Continued Medical Work up, Requiring sitter/restraints   Patient Goals and CMS Choice Patient states their goals for this hospitalization and ongoing recovery are:: Unable to obtain          Expected Discharge Plan and Services In-house Referral: Clinical Social  Work Discharge Planning Services: NA Post Acute Care Choice: NA Living arrangements for the past 2 months: Homeless                 DME Arranged: N/A DME Agency: NA                  Prior Living Arrangements/Services Living arrangements for the past 2 months: Homeless Lives with:: Self Patient language and need for interpreter reviewed:: Yes Do you feel safe going back to the place where you live?: Yes      Need for Family Participation in Patient Care: Yes (Comment) Care giver support system in place?: No (comment)   Criminal Activity/Legal Involvement Pertinent to Current Situation/Hospitalization: No - Comment as needed  Activities of Daily Living   ADL Screening (condition at time of admission) Is the patient deaf or have difficulty hearing?: No Does the patient have difficulty seeing, even when wearing glasses/contacts?: No Does the patient have difficulty concentrating, remembering, or making decisions?: No  Permission Sought/Granted Permission sought to share information with : Family Supports Permission granted to share information with : Yes, Verbal Permission Granted  Share Information with NAME: Tyler [name]     Permission granted to share info w Relationship: Daughter  Permission granted to share info w Contact Information: [phone]  Emotional Assessment Appearance:: Appears stated age Attitude/Demeanor/Rapport: Lethargic   Orientation: : Oriented to Self Alcohol / Substance Use: Alcohol Use, Illicit Drugs Psych  Involvement: Yes (comment)  Admission diagnosis:  Wound cellulitis [L03.90] Bilateral lower leg cellulitis [L03.116, L03.115] Patient Active Problem List   Diagnosis Date Noted   Wound cellulitis 09/13/2023   Sepsis (HCC) 09/13/2023   Paranoid (HCC) 09/13/2023   Hypokalemia 09/13/2023   Protein-calorie malnutrition, severe 08/09/2019   Cerebral embolism with cerebral infarction 07/28/2019   Rhabdomyolysis 07/23/2019   Acute kidney  failure (HCC) 07/23/2019   Acute metabolic encephalopathy 07/23/2019   Polysubstance abuse (HCC) 07/23/2019   Hyperkalemia 07/23/2019   Transaminitis 07/23/2019   HTN (hypertension) 07/23/2019   Fall 04/21/2012   Traumatic subdural hematoma (HCC) 04/21/2012   ICC 04/21/2012   Left orbit fracture (HCC) 04/21/2012   Left maxillary fracture (HCC) 04/21/2012   Alcohol use 04/21/2012   Cocaine use 04/21/2012   PCP:  Patient, No Pcp Per Pharmacy:   [name] [address] [place] [address] Phone: [phone] Fax: [phone]  [id number] - [place], [place] - [address] [place] [address] Phone: [phone] Fax: [phone]   - [facility] [address] [place] [address] Phone: [phone] Fax: [phone]     Social Determinants of Health (SDOH) Social History: SDOH Screenings   Food Insecurity: Patient Unable To Answer (09/14/2023)  Housing: Patient Unable To Answer (09/14/2023)  Tobacco Use: High Risk (09/13/2023)   SDOH Interventions:     Readmission Risk Interventions    09/14/2023    2:36 PM  Readmission Risk Prevention Plan  Transportation Screening Complete  PCP or Specialist Appt within 5-7 Days Complete  Home Care Screening Complete  Medication Review (RN CM) Complete

## 2023-09-15 DIAGNOSIS — G9341 Metabolic encephalopathy: Secondary | ICD-10-CM | POA: Diagnosis not present

## 2023-09-15 DIAGNOSIS — A419 Sepsis, unspecified organism: Secondary | ICD-10-CM | POA: Diagnosis not present

## 2023-09-15 DIAGNOSIS — R6520 Severe sepsis without septic shock: Secondary | ICD-10-CM | POA: Diagnosis not present

## 2023-09-15 LAB — CREATININE, SERUM
Creatinine, Ser: 1.33 mg/dL — ABNORMAL HIGH (ref 0.61–1.24)
GFR, Estimated: >60 mL/min (ref >60)

## 2023-09-15 MED ORDER — ORAL CARE MOUTH RINSE: 15.0000 mL | OROMUCOSAL | Status: DC | PRN

## 2023-09-15 MED ORDER — MUPIROCIN 2 % EX OINT
TOPICAL_OINTMENT | Freq: Every day | CUTANEOUS | Status: DC
  Administered 2023-09-16: 1 via TOPICAL
  Filled 2023-09-15: qty 22

## 2023-09-15 NOTE — Plan of Care (Signed)
  Problem: Clinical Measurements: Goal: Ability to maintain clinical measurements within normal limits will improve Outcome: Progressing Goal: Will remain free from infection Outcome: Progressing   Problem: Nutrition: Goal: Adequate nutrition will be maintained Outcome: Progressing   Problem: Pain Managment: Goal: General experience of comfort will improve Outcome: Progressing   Problem: Safety: Goal: Ability to remain free from injury will improve Outcome: Progressing   

## 2023-09-15 NOTE — Progress Notes (Signed)
[facility] PROGRESS NOTE  Aramis Weil (DOB: Jul 06, 1971) ZOX:[id number] PCP: Patient, No Pcp Per  Brief Narrative: Alvan Culpepper is a 52 y.o. male with a history of CVA, traumatic SDH, polysubstance abuse, HTN who was brought to the ED by police on 09/13/2023 under IVC after displaying paranoia. UDS +amphetamines. He was found to have swollen, painful feet, WBC 12k, afebrile. Antibiotics started for cellulitis and psychiatry consulted.   Subjective: Pain in feet slightly improved. Social services at bedside.  Objective: BP 124/88 (BP Location: Left Arm)   Pulse 82   Temp 98.5 F (36.9 C) (Oral)   Resp 18   Ht 5\' 8"  (1.727 m)   Wt 74.8 kg   SpO2 96%   BMI 25.09 kg/m   Gen: Chronically ill-appearing, poor dentition, no distress Pulm: Nonlabored, clear  CV: RRR, no MRG or JVD GI: Soft, NT, ND, +BS Neuro: Alert, incompletely oriented, agitated with questions about family. No new focal deficits. Ext: Warm, dry. Feet edematous, less so than previous, less tender, still erythema extending in feet, left medial forefoot eschar coming unroofed.   Assessment & Plan: Sepsis due to cellulitis: No osteomyelitis by XR. Wounds from excoriation noted. Healing. WBC normalized. CK wnl at 321.  - Exam showing improvement. Continue doxycyline, cefadroxil and routine wound care.  Orders in, include bactroban, keep covered.   Paranoia, acute metabolic encephalopathy: May have been precipitated by substance(s), he remains with some confusion that may be his baseline per records. TSH wnl. EtOH negative, HIV NR.  - D/w psychiatry, appreciate recommendations. He is under IVC, though may be able to lift this now that offending substance is being metabolized and excreted.  - Continue thiamine. - TOC consulted  Amphetamine use:  - Continue supportive care and cessation counseling.   History of CVA:  - Continue statin. Not on aspirin. Suspect due to hx SDH, ongoing fall risk, not on antiplatelet.    HTN:  - Continue prn labetalol  Hypokalemia: Supplemented, resolved.  Tyrone Nine, MD [facility] [email or web address] 09/15/2023, 10:39 AM

## 2023-09-15 NOTE — TOC Progression Note (Signed)
Transition of Care [facility]) - Progression Note    Patient Details  Name: Tyler [name] MRN: [id number] Date of Birth: [birth year]  Transition of Care [facility]) CM/SW Contact  Otelia Santee, LCSW Phone Number: 09/15/2023, 9:19 AM  Clinical Narrative:    APS report accepted. APS case worker assigned to case is Maple Hudson [phone]). Dorene Sorrow is to come evaluate pt later this morning. TOC will follow.   Expected Discharge Plan:  (TBD) Barriers to Discharge: Homeless with medical needs, Continued Medical Work up, Requiring sitter/restraints  Expected Discharge Plan and Services In-house Referral: Clinical Social Work Discharge Planning Services: NA Post Acute Care Choice: NA Living arrangements for the past 2 months: Homeless                 DME Arranged: N/A DME Agency: NA                   Social Determinants of Health (SDOH) Interventions SDOH Screenings   Food Insecurity: Patient Unable To Answer (09/14/2023)  Housing: Patient Unable To Answer (09/14/2023)  Tobacco Use: High Risk (09/13/2023)    Readmission Risk Interventions    09/15/2023    9:02 AM 09/14/2023    2:36 PM  Readmission Risk Prevention Plan  Transportation Screening Complete Complete  PCP or Specialist Appt within 5-7 Days  Complete  Home Care Screening  Complete  Medication Review (RN CM)  Complete

## 2023-09-16 DIAGNOSIS — G9341 Metabolic encephalopathy: Secondary | ICD-10-CM | POA: Diagnosis not present

## 2023-09-16 DIAGNOSIS — A419 Sepsis, unspecified organism: Secondary | ICD-10-CM | POA: Diagnosis not present

## 2023-09-16 DIAGNOSIS — R6520 Severe sepsis without septic shock: Secondary | ICD-10-CM | POA: Diagnosis not present

## 2023-09-16 NOTE — Progress Notes (Signed)
[facility] PROGRESS NOTE  Khary Schaben (DOB: 12/15/1970) ZOX:[id number] PCP: Patient, No Pcp Per  Brief Narrative: Lankford Gutzmer is a 52 y.o. male with a history of CVA, traumatic SDH, polysubstance abuse, HTN who was brought to the ED by police on 09/13/2023 under IVC after displaying paranoia. UDS +amphetamines. He was found to have swollen, painful feet, WBC 12k, afebrile. Antibiotics started for cellulitis and psychiatry consulted.   Subjective: Pain in both feet is stable. The pain left now is more chronic, burning/on fire and does not correlate with the wounds themselves.   Objective: BP (!) 160/106   Pulse 73   Temp 98.1 F (36.7 C) (Oral)   Resp 16   Ht 5\' 8"  (1.727 m)   Wt 74.8 kg   SpO2 97%   BMI 25.09 kg/m   Gen: Chronically ill-appearing male in no distress Pulm: Clear, nonlabored  CV: RRR, no edema GI: Soft, NT, ND, +BS  Neuro: Alert and cooperative. No new focal deficits. Ext: Warm, no deformities. Skin: Medial shallow wounds to feet with improving/mild surrounding erythema, no exudate under mepilex.  Assessment & Plan: Sepsis due to cellulitis: No osteomyelitis by XR. Wounds from excoriation noted. Healing. WBC normalized. CK wnl at 321.  - Exam showing improvement. Continue doxycyline, cefadroxil and routine wound care.  Orders in, include bactroban, keep covered.   Paranoia, acute metabolic encephalopathy: May have been precipitated by substance(s), he remains with some confusion that may be his baseline per records. TSH wnl. EtOH negative, HIV NR.  - D/w psychiatry, appreciate recommendations. He is under IVC, though may be able to lift this now that offending substance is being metabolized and excreted.  - Continue thiamine. - TOC consulted  Peripheral neuropathy:  - Check vitamin B12, last HbA1c and TSH wnl.  - Start gabapentin. Recheck BMP in AM to guide dosing  Amphetamine use:  - Continue supportive care and cessation counseling.   History  of CVA:  - Continue statin. Not on aspirin. Suspect due to hx SDH, ongoing fall risk, not on antiplatelet.   HTN:  - Continue prn labetalol  Hypokalemia: Supplemented, resolved.  Tyler Nine, MD [facility] [email or web address] 09/16/2023, 10:35 AM

## 2023-09-16 NOTE — TOC Progression Note (Signed)
Transition of Care [facility]) - Progression Note    Patient Details  Name: Tyler [name] MRN: [id number] Date of Birth: 06-02-1971  Transition of Care [facility]) CM/SW Contact  Otelia Santee, LCSW Phone Number: 09/16/2023, 2:09 PM  Clinical Narrative:    Commitment Change for IVC completed. Pt no longer under IVC.  Spoke with Dorene Sorrow at APS who is going to work with daughter to file for guardianship and will continue to follow pt's case in the community.    Expected Discharge Plan:  (TBD) Barriers to Discharge: Homeless with medical needs, Continued Medical Work up, Requiring sitter/restraints  Expected Discharge Plan and Services In-house Referral: Clinical Social Work Discharge Planning Services: NA Post Acute Care Choice: NA Living arrangements for the past 2 months: Homeless                 DME Arranged: N/A DME Agency: NA                   Social Determinants of Health (SDOH) Interventions SDOH Screenings   Food Insecurity: Patient Unable To Answer (09/14/2023)  Housing: Patient Unable To Answer (09/14/2023)  Tobacco Use: High Risk (09/13/2023)    Readmission Risk Interventions    09/15/2023    9:02 AM 09/14/2023    2:36 PM  Readmission Risk Prevention Plan  Transportation Screening Complete Complete  PCP or Specialist Appt within 5-7 Days  Complete  Home Care Screening  Complete  Medication Review (RN CM)  Complete

## 2023-09-17 ENCOUNTER — Other Ambulatory Visit (HOSPITAL_COMMUNITY): Payer: Self-pay

## 2023-09-17 DIAGNOSIS — R6520 Severe sepsis without septic shock: Secondary | ICD-10-CM | POA: Diagnosis not present

## 2023-09-17 DIAGNOSIS — A419 Sepsis, unspecified organism: Secondary | ICD-10-CM | POA: Diagnosis not present

## 2023-09-17 DIAGNOSIS — G9341 Metabolic encephalopathy: Secondary | ICD-10-CM | POA: Diagnosis not present

## 2023-09-17 LAB — BASIC METABOLIC PANEL
Anion gap: 9 (ref 5–15)
BUN: 14 mg/dL (ref 6–20)
CO2: 27 mmol/L (ref 22–32)
Calcium: 8.4 mg/dL — ABNORMAL LOW (ref 8.9–10.3)
Chloride: 105 mmol/L (ref 98–111)
Creatinine, Ser: 1 mg/dL (ref 0.61–1.24)
GFR, Estimated: >60 mL/min (ref >60)
Glucose, Bld: 92 mg/dL (ref 70–99)
Potassium: 3.3 mmol/L — ABNORMAL LOW (ref 3.5–5.1)
Sodium: 141 mmol/L (ref 135–145)

## 2023-09-17 LAB — RPR: RPR Ser Ql: NONREACTIVE

## 2023-09-17 LAB — VITAMIN B12: Vitamin B-12: 441 pg/mL (ref 180–914)

## 2023-09-17 MED ORDER — DOXYCYCLINE HYCLATE 100 MG PO TABS
100.0000 mg | ORAL_TABLET | Freq: Two times a day (BID) | ORAL | 0 refills | Status: AC
  Filled 2023-09-17: qty 12, 6d supply, fill #0

## 2023-09-17 MED ORDER — CEFADROXIL 500 MG PO CAPS
500.0000 mg | ORAL_CAPSULE | Freq: Two times a day (BID) | ORAL | 0 refills | Status: AC
  Filled 2023-09-17: qty 12, 6d supply, fill #0

## 2023-09-17 NOTE — Discharge Summary (Signed)
Physician Discharge Summary   Patient: Tyler [name] MRN: [id number] DOB: 08-Jan-1971  Admit date:     09/13/2023  Discharge date: 09/17/23  Discharge Physician: Tyrone Nine   PCP: Patient, No Pcp Per   Recommendations at discharge:  Continue APS follow up and pursuit of guardianship. Pt will complete oral antibiotics provided to him prior to discharge for LE cellulitis which has significantly improved.  Continue counseling for substance cessation.   Discharge Diagnoses: Principal Problem:   Sepsis (HCC) Active Problems:   Acute metabolic encephalopathy   Polysubstance abuse (HCC)   HTN (hypertension)   Wound cellulitis   Paranoid (HCC)   Hypokalemia  Hospital Course: Tyler [name] is a 52 y.o. male with a history of CVA, traumatic SDH, polysubstance abuse, HTN who was brought to the ED by police on 09/13/2023 under IVC after displaying paranoia. UDS +amphetamines. He was found to have swollen, painful feet, WBC 12k, afebrile. Antibiotics started for cellulitis and psychiatry consulted. His mental status returned to baseline without targeted interventions, suspected to be due to substance washout/intoxication clearance. Clinically has improved, and is stable medically for discharge. APS evaluated the patient and will continue to follow him at shelter.   Assessment and Plan: Sepsis due to cellulitis: No osteomyelitis by XR. Wounds from excoriation noted. Healing. WBC normalized. CK wnl at 321.  - Continue doxycyline, cefadroxil and keep wounds covered. As long as he does not return to using amphetamine, shallow wounds will continue to heal.    Paranoia, acute metabolic encephalopathy: May have been precipitated by substance(s), he remains with some confusion that may be his baseline per records. TSH wnl. EtOH negative, HIV NR.  - D/w psychiatry, appreciate recommendations. Was under IVC initially though that is no longer needed. No further paranoia since admission since offending  substance has been metabolized and excreted.    Bilateral feet burning pain: Possibly peripheral neuropathy.  - Vitamin B12, last HbA1c and TSH are all wnl.  - Started gabapentin with limited improvement, pt opts against continuing this.    Amphetamine use:  - Continue supportive care and cessation counseling.    History of CVA:  - Continue statin. Not on aspirin. Suspect due to hx SDH, ongoing fall risk, not on antiplatelet.    HTN: Pt nonadherent to medications.   Hypokalemia: Supplemented, resolved.  Consultants: TOC/APS, psychiatry Procedures performed: None  Disposition: Return to shelter Diet recommendation:  Cardiac diet DISCHARGE MEDICATION: Allergies as of 09/17/2023   No Known Allergies      Medication List     TAKE these medications    atorvastatin 20 MG tablet Commonly known as: LIPITOR Take 1 tablet (20 mg total) by mouth daily at 6 PM.   cefadroxil 500 MG capsule Commonly known as: DURICEF Take 1 capsule (500 mg total) by mouth 2 (two) times daily for 6 days.   doxycycline 100 MG tablet Commonly known as: VIBRA-TABS Take 1 tablet (100 mg total) by mouth every 12 (twelve) hours for 6 days.   lactulose 10 GM/15ML solution Commonly known as: CHRONULAC Take 30 mLs (20 g total) by mouth daily.   metoprolol tartrate 50 MG tablet Commonly known as: LOPRESSOR Take 1 tablet (50 mg total) by mouth 2 (two) times daily.   pantoprazole 40 MG tablet Commonly known as: PROTONIX Take 1 tablet (40 mg total) by mouth daily.        Discharge Exam: Filed Weights   09/13/23 1200  Weight: 74.8 kg  BP (!) 171/96 (BP Location: Left  Arm) Comment: labetolol given  Pulse 75   Temp 98.4 F (36.9 C)   Resp 16   Ht 5\' 8"  (1.727 m)   Wt 74.8 kg   SpO2 97%   BMI 25.09 kg/m   No distress, clear and nonlabored, feet no longer edematous, significantly improved erythema. Shallow abrasions noted, stable  Condition at discharge: stable  The results of significant  diagnostics from this hospitalization (including imaging, microbiology, ancillary and laboratory) are listed below for reference.   Imaging Studies: DG Foot Complete Left  Result Date: 09/13/2023 CLINICAL DATA:  Clinical concern for osteomyelitis. Altered mental status. Staying at a [facility] shelter. EXAM: LEFT FOOT - COMPLETE 3+ VIEW COMPARISON:  None Available. FINDINGS: Normal bone mineralization. The cortices are intact. Tiny ossicle at the dorsal aspect of the talonavicular joint appears corticated and chronic. No acute fracture or dislocation. Mild-to-moderate predominantly dorsal forefoot soft tissue swelling, similar to the contralateral foot imaged the same day. Mild-to-moderate medial and minimal lateral malleolar ankle soft tissue swelling. No soft tissue ulcer is identified. No subcutaneous air. IMPRESSION: 1. No acute fracture or dislocation. 2. Mild-to-moderate predominantly dorsal forefoot soft tissue swelling. No soft tissue ulcer is identified. No subcutaneous air. Electronically Signed   By: Neita Garnet M.D.   On: 09/13/2023 18:34   DG Foot Complete Right  Result Date: 09/13/2023 CLINICAL DATA:  Altered mental status. Swollen feet. Staying a Surveyor, quantity. Clinical concern for osteomyelitis. EXAM: RIGHT FOOT COMPLETE - 3+ VIEW COMPARISON:  Right ankle radiographs 06/08/2009 FINDINGS: Normal bone mineralization. Small plantar calcaneal heel spur is similar to prior. No acute fracture or dislocation. Mild-to-moderate predominantly dorsal forefoot soft tissue swelling, similar to the contralateral foot imaged the same day. Mild-to-moderate medial and mild lateral malleolar ankle soft tissue swelling, slightly decreased medially compared to 06/08/2009 but new laterally. No soft tissue ulcer is identified. No cortical erosion. No subcutaneous air. IMPRESSION: 1. Mild-to-moderate predominantly dorsal forefoot soft tissue swelling, similar to the contralateral foot imaged the same day. 2.  Mild-to-moderate medial and mild lateral malleolar ankle soft tissue swelling, slightly decreased medially compared to 06/08/2009 but new laterally. 3. No radiographic evidence of osteomyelitis. Electronically Signed   By: Neita Garnet M.D.   On: 09/13/2023 18:31   CT Head Wo Contrast  Result Date: 09/13/2023 CLINICAL DATA:  Delirium. EXAM: CT HEAD WITHOUT CONTRAST TECHNIQUE: Contiguous axial images were obtained from the base of the skull through the vertex without intravenous contrast. RADIATION DOSE REDUCTION: This exam was performed according to the departmental dose-optimization program which includes automated exposure control, adjustment of the mA and/or kV according to patient size and/or use of iterative reconstruction technique. COMPARISON:  Head CT 03/01/2021. FINDINGS: Brain: No acute hemorrhage. Stable severe chronic small-vessel disease with old infarct in the right temporal lobe and old perforator infarcts in the bilateral basal ganglia. No new loss of gray-white differentiation. Age advanced volume loss. No extra-axial collection, mass effect or shift. Vascular: No hyperdense vessel or unexpected calcification. Skull: No calvarial fracture or suspicious bone lesion. Skull base is unremarkable. Sinuses/Orbits: No acute finding. Other: None. IMPRESSION: 1. No acute intracranial abnormality. 2. 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 3. Age advanced volume loss. Electronically Signed   By: Orvan Falconer M.D.   On: 09/13/2023 13:39    Microbiology: Results for orders placed or performed during the hospital encounter of 07/23/19  SARS Coronavirus 2 [facility] order, Performed in [facility] hospital lab)  Status: None   Collection Time: 07/23/19  9:57 PM  Result Value Ref Range Status   SARS Coronavirus 2 NEGATIVE NEGATIVE Final    Comment: (NOTE) If result is NEGATIVE SARS-CoV-2 target nucleic acids are NOT  DETECTED. The SARS-CoV-2 RNA is generally detectable in upper and lower  respiratory specimens during the acute phase of infection. The lowest  concentration of SARS-CoV-2 viral copies this assay can detect is 250  copies / mL. A negative result does not preclude SARS-CoV-2 infection  and should not be used as the sole basis for treatment or other  patient management decisions.  A negative result may occur with  improper specimen collection / handling, submission of specimen other  than nasopharyngeal swab, presence of viral mutation(s) within the  areas targeted by this assay, and inadequate number of viral copies  (<250 copies / mL). A negative result must be combined with clinical  observations, patient history, and epidemiological information. If result is POSITIVE SARS-CoV-2 target nucleic acids are DETECTED. The SARS-CoV-2 RNA is generally detectable in upper and lower  respiratory specimens dur ing the acute phase of infection.  Positive  results are indicative of active infection with SARS-CoV-2.  Clinical  correlation with patient history and other diagnostic information is  necessary to determine patient infection status.  Positive results do  not rule out bacterial infection or co-infection with other viruses. If result is PRESUMPTIVE POSTIVE SARS-CoV-2 nucleic acids MAY BE PRESENT.   A presumptive positive result was obtained on the submitted specimen  and confirmed on repeat testing.  While 2019 novel coronavirus  (SARS-CoV-2) nucleic acids may be present in the submitted sample  additional confirmatory testing may be necessary for epidemiological  and / or clinical management purposes  to differentiate between  SARS-CoV-2 and other Sarbecovirus currently known to infect humans.  If clinically indicated additional testing with an alternate test  methodology [phone]) is advised. The SARS-CoV-2 RNA is generally  detectable in upper and lower respiratory sp ecimens during the  acute  phase of infection. The expected result is Negative. Fact Sheet for Patients:  BoilerBrush.com.cy Fact Sheet for Healthcare Providers: https://pope.com/ This test is not yet approved or cleared by the [place] FDA and has been authorized for detection and/or diagnosis of SARS-CoV-2 by FDA under an Emergency Use Authorization (EUA).  This EUA will remain in effect (meaning this test can be used) for the duration of the COVID-19 declaration under Section 564(b)(1) of the Act, 21 U.S.C. section 360bbb-3(b)(1), unless the authorization is terminated or revoked sooner. Performed at [facility], [address], [place], [place] [address]   Culture, blood (routine x 2)     Status: None   Collection Time: 07/23/19 10:41 PM   Specimen: BLOOD LEFT HAND  Result Value Ref Range Status   Specimen Description BLOOD LEFT HAND  Final   Special Requests   Final    AEROBIC BOTTLE ONLY Blood Culture results may not be optimal due to an inadequate volume of blood received in culture bottles   Culture   Final    NO GROWTH 5 DAYS Performed at [facility], [address] [place], [place] [address]    Report Status 07/29/2019 FINAL  Final  Culture, blood (routine x 2)     Status: None   Collection Time: 07/23/19 10:50 PM   Specimen: BLOOD  Result Value Ref Range Status   Specimen Description BLOOD LEFT ANTECUBITAL  Final   Special Requests  Final    AEROBIC BOTTLE ONLY Blood Culture results may not be optimal due to an inadequate volume of blood received in culture bottles   Culture   Final    NO GROWTH 5 DAYS Performed at [facility], [address] [place], [place] [address]    Report Status 07/29/2019 FINAL  Final  MRSA PCR Screening     Status: None   Collection Time: 07/24/19  2:10 AM   Specimen: Nasal Mucosa; Nasopharyngeal  Result Value Ref Range Status   MRSA by PCR NEGATIVE NEGATIVE Final    Comment:  Performed at [facility], [address], [place], [place] [address]  Culture, Urine     Status: Abnormal   Collection Time: 07/25/19  1:39 PM   Specimen: Urine, Random  Result Value Ref Range Status   Specimen Description URINE, RANDOM  Final   Special Requests   Final    NONE Performed at [facility], [address], [place], [place] [address]    Culture MULTIPLE SPECIES PRESENT, SUGGEST RECOLLECTION (A)  Final   Report Status 07/26/2019 FINAL  Final  Culture, blood (Routine X 2) w Reflex to ID Panel     Status: None   Collection Time: 07/28/19 12:11 PM   Specimen: BLOOD LEFT ARM  Result Value Ref Range Status   Specimen Description BLOOD LEFT ARM  Final   Special Requests   Final    BOTTLES DRAWN AEROBIC AND ANAEROBIC Blood Culture adequate volume   Culture   Final    NO GROWTH 5 DAYS Performed at [facility], [address], [place], [place] [address]    Report Status 08/02/2019 FINAL  Final  Culture, blood (Routine X 2) w Reflex to ID Panel     Status: None   Collection Time: 07/28/19 12:12 PM   Specimen: BLOOD LEFT ARM  Result Value Ref Range Status   Specimen Description BLOOD LEFT ARM  Final   Special Requests   Final    BOTTLES DRAWN AEROBIC ONLY Blood Culture adequate volume   Culture   Final    NO GROWTH 5 DAYS Performed at [facility], [address] [place], [place] [address]    Report Status 08/02/2019 FINAL  Final  MRSA PCR Screening     Status: None   Collection Time: 08/25/19 12:55 PM   Specimen: Nasopharyngeal  Result Value Ref Range Status   MRSA by PCR NEGATIVE NEGATIVE Final    Comment:        The GeneXpert MRSA Assay (FDA approved for NASAL specimens only), is one component of a comprehensive MRSA colonization surveillance program. It is not intended to diagnose MRSA infection nor to guide or monitor treatment for MRSA infections. Performed at [facility], [address] [place], [place] [address]      Labs: CBC: Recent Labs  Lab 09/13/23 1325 09/14/23 0539  WBC 12.3* 7.0  NEUTROABS 8.6*  --   HGB 14.3 11.6*  HCT 43.7 35.7*  MCV 91.0 91.8  PLT 276 184   Basic Metabolic Panel: Recent Labs  Lab 09/13/23 1251 09/14/23 0539 09/15/23 0531 09/17/23 0540  NA 134* 138  --  141  K 3.3* 3.6  --  3.3*  CL 101 105  --  105  CO2 24 25  --  27  GLUCOSE 103* 98  --  92  BUN 27* 21*  --  14  CREATININE 1.43* 1.28* 1.33*  1.00  CALCIUM 8.6* 8.3*  --  8.4*   Liver Function Tests: Recent Labs  Lab 09/13/23 1251  AST 48*  ALT 36  ALKPHOS 87  BILITOT 1.9*  PROT 7.0  ALBUMIN 3.4*   CBG: No results for input(s): "GLUCAP" in the last 168 hours.  Discharge time spent: greater than 30 minutes.  Signed: Tyrone Nine, MD [facility] 09/17/2023

## 2023-09-17 NOTE — Plan of Care (Signed)

## 2023-09-17 NOTE — Plan of Care (Signed)
No acute events overnight. Pt compliant with POC. Problem: Education: Goal: Knowledge of General Education information will improve Description: Including pain rating scale, medication(s)/side effects and non-pharmacologic comfort measures Outcome: Progressing   Problem: Health Behavior/Discharge Planning: Goal: Ability to manage health-related needs will improve Outcome: Progressing   Problem: Clinical Measurements: Goal: Ability to maintain clinical measurements within normal limits will improve Outcome: Progressing Goal: Will remain free from infection Outcome: Progressing Goal: Diagnostic test results will improve Outcome: Progressing Goal: Respiratory complications will improve Outcome: Progressing Goal: Cardiovascular complication will be avoided Outcome: Progressing   Problem: Activity: Goal: Risk for activity intolerance will decrease Outcome: Progressing   Problem: Nutrition: Goal: Adequate nutrition will be maintained Outcome: Progressing   Problem: Coping: Goal: Level of anxiety will decrease Outcome: Progressing   Problem: Elimination: Goal: Will not experience complications related to bowel motility Outcome: Progressing Goal: Will not experience complications related to urinary retention Outcome: Progressing   Problem: Pain Managment: Goal: General experience of comfort will improve Outcome: Progressing   Problem: Safety: Goal: Ability to remain free from injury will improve Outcome: Progressing   Problem: Skin Integrity: Goal: Risk for impaired skin integrity will decrease Outcome: Progressing

## 2023-09-17 NOTE — TOC Transition Note (Signed)
Transition of Care [facility]) - CM/SW Discharge Note   Patient Details  Name: Tyler [name] MRN: [id number] Date of Birth: 12-16-1970  Transition of Care [facility]) CM/SW Contact:  Darleene Cleaver, LCSW Phone Number: 09/17/2023, 3:42 PM   Clinical Narrative:     CSW was informed patient needed a ride.  CSW spoke to patient, he said he does not have any money to pay for a ride, and does not have anyone who can pick him up.  CSW asked patient where he wanted to go, he wanted to go to [facility] at [address], [place], [place] [address].  Per patient his aunt lives near there and he knows people down there that he can stay with.  CSW contacted TOC on call supervisor Sharol Roussel to confirm we can use a cab voucher for patient to get down there, she approved it.  CSW updated charge nurse, and provided [employer].  Patient discharging today, he did not express any other issues or concerns.     Final next level of care: Home/Self Care Barriers to Discharge: Barriers Resolved   Patient Goals and CMS Choice      Discharge Placement  Patient discharging to [facility] in [place], [place] where he has friends and family living there.                       Discharge Plan and Services Additional resources added to the After Visit Summary for   In-house Referral: Clinical Social Work Discharge Planning Services: NA Post Acute Care Choice: NA          DME Arranged: N/A DME Agency: NA                  Social Determinants of Health (SDOH) Interventions SDOH Screenings   Food Insecurity: Patient Unable To Answer (09/14/2023)  Housing: Patient Unable To Answer (09/14/2023)  Tobacco Use: High Risk (09/13/2023)     Readmission Risk Interventions    09/15/2023    9:02 AM 09/14/2023    2:36 PM  Readmission Risk Prevention Plan  Transportation Screening Complete Complete  PCP or Specialist Appt within 5-7 Days  Complete  Home Care Screening  Complete  Medication Review (RN CM)   Complete

## 2023-09-22 DIAGNOSIS — I6782 Cerebral ischemia: Secondary | ICD-10-CM | POA: Diagnosis not present

## 2023-09-22 DIAGNOSIS — I10 Essential (primary) hypertension: Secondary | ICD-10-CM | POA: Diagnosis not present

## 2023-09-22 DIAGNOSIS — I480 Paroxysmal atrial fibrillation: Secondary | ICD-10-CM | POA: Diagnosis not present

## 2023-09-22 DIAGNOSIS — M5030 Other cervical disc degeneration, unspecified cervical region: Secondary | ICD-10-CM | POA: Diagnosis not present

## 2023-09-22 DIAGNOSIS — W19XXXA Unspecified fall, initial encounter: Secondary | ICD-10-CM | POA: Diagnosis not present

## 2023-09-22 DIAGNOSIS — M4856XA Collapsed vertebra, not elsewhere classified, lumbar region, initial encounter for fracture: Secondary | ICD-10-CM | POA: Diagnosis not present

## 2023-09-22 DIAGNOSIS — M47816 Spondylosis without myelopathy or radiculopathy, lumbar region: Secondary | ICD-10-CM | POA: Diagnosis not present

## 2023-09-22 DIAGNOSIS — S0181XA Laceration without foreign body of other part of head, initial encounter: Secondary | ICD-10-CM | POA: Diagnosis not present

## 2023-09-22 DIAGNOSIS — R4020 Unspecified coma: Secondary | ICD-10-CM | POA: Diagnosis not present

## 2023-09-22 DIAGNOSIS — I491 Atrial premature depolarization: Secondary | ICD-10-CM | POA: Diagnosis not present

## 2023-09-22 DIAGNOSIS — G319 Degenerative disease of nervous system, unspecified: Secondary | ICD-10-CM | POA: Diagnosis not present

## 2023-09-22 DIAGNOSIS — R410 Disorientation, unspecified: Secondary | ICD-10-CM | POA: Diagnosis not present

## 2023-09-22 DIAGNOSIS — R4182 Altered mental status, unspecified: Secondary | ICD-10-CM | POA: Diagnosis not present

## 2023-09-22 DIAGNOSIS — I700 Atherosclerosis of aorta: Secondary | ICD-10-CM | POA: Diagnosis not present

## 2023-09-23 DIAGNOSIS — F10131 Alcohol abuse with withdrawal delirium: Secondary | ICD-10-CM | POA: Diagnosis not present

## 2023-09-23 DIAGNOSIS — M6282 Rhabdomyolysis: Secondary | ICD-10-CM | POA: Diagnosis not present

## 2023-09-23 DIAGNOSIS — Z5900 Homelessness unspecified: Secondary | ICD-10-CM | POA: Diagnosis not present

## 2023-09-24 DIAGNOSIS — M6282 Rhabdomyolysis: Secondary | ICD-10-CM | POA: Diagnosis not present

## 2023-09-24 DIAGNOSIS — Z5900 Homelessness unspecified: Secondary | ICD-10-CM | POA: Diagnosis not present

## 2023-09-24 DIAGNOSIS — F10131 Alcohol abuse with withdrawal delirium: Secondary | ICD-10-CM | POA: Diagnosis not present

## 2023-09-25 DIAGNOSIS — Z5900 Homelessness unspecified: Secondary | ICD-10-CM | POA: Diagnosis not present

## 2023-09-25 DIAGNOSIS — F10131 Alcohol abuse with withdrawal delirium: Secondary | ICD-10-CM | POA: Diagnosis not present

## 2023-09-25 DIAGNOSIS — M6282 Rhabdomyolysis: Secondary | ICD-10-CM | POA: Diagnosis not present

## 2023-09-26 DIAGNOSIS — Z5900 Homelessness unspecified: Secondary | ICD-10-CM | POA: Diagnosis not present

## 2023-09-26 DIAGNOSIS — M6282 Rhabdomyolysis: Secondary | ICD-10-CM | POA: Diagnosis not present

## 2023-09-26 DIAGNOSIS — F10131 Alcohol abuse with withdrawal delirium: Secondary | ICD-10-CM | POA: Diagnosis not present

## 2023-09-26 DIAGNOSIS — I4891 Unspecified atrial fibrillation: Secondary | ICD-10-CM | POA: Diagnosis not present

## 2023-09-27 DIAGNOSIS — Z5900 Homelessness unspecified: Secondary | ICD-10-CM | POA: Diagnosis not present

## 2023-09-27 DIAGNOSIS — F10131 Alcohol abuse with withdrawal delirium: Secondary | ICD-10-CM | POA: Diagnosis not present

## 2023-09-27 DIAGNOSIS — M6282 Rhabdomyolysis: Secondary | ICD-10-CM | POA: Diagnosis not present

## 2023-09-28 DIAGNOSIS — Z5900 Homelessness unspecified: Secondary | ICD-10-CM | POA: Diagnosis not present

## 2023-09-28 DIAGNOSIS — M6282 Rhabdomyolysis: Secondary | ICD-10-CM | POA: Diagnosis not present

## 2023-09-28 DIAGNOSIS — F10131 Alcohol abuse with withdrawal delirium: Secondary | ICD-10-CM | POA: Diagnosis not present

## 2023-09-29 DIAGNOSIS — G9341 Metabolic encephalopathy: Secondary | ICD-10-CM | POA: Diagnosis not present

## 2023-09-29 DIAGNOSIS — N179 Acute kidney failure, unspecified: Secondary | ICD-10-CM | POA: Diagnosis not present

## 2023-09-29 DIAGNOSIS — E512 Wernicke's encephalopathy: Secondary | ICD-10-CM | POA: Diagnosis not present

## 2023-09-30 DIAGNOSIS — G9341 Metabolic encephalopathy: Secondary | ICD-10-CM | POA: Diagnosis not present

## 2023-09-30 DIAGNOSIS — E512 Wernicke's encephalopathy: Secondary | ICD-10-CM | POA: Diagnosis not present

## 2023-09-30 DIAGNOSIS — N179 Acute kidney failure, unspecified: Secondary | ICD-10-CM | POA: Diagnosis not present

## 2023-10-01 DIAGNOSIS — G9341 Metabolic encephalopathy: Secondary | ICD-10-CM | POA: Diagnosis not present

## 2023-10-01 DIAGNOSIS — E512 Wernicke's encephalopathy: Secondary | ICD-10-CM | POA: Diagnosis not present

## 2023-10-01 DIAGNOSIS — N179 Acute kidney failure, unspecified: Secondary | ICD-10-CM | POA: Diagnosis not present

## 2023-10-02 DIAGNOSIS — G9341 Metabolic encephalopathy: Secondary | ICD-10-CM | POA: Diagnosis not present

## 2023-10-02 DIAGNOSIS — E512 Wernicke's encephalopathy: Secondary | ICD-10-CM | POA: Diagnosis not present

## 2023-10-02 DIAGNOSIS — N179 Acute kidney failure, unspecified: Secondary | ICD-10-CM | POA: Diagnosis not present

## 2023-10-03 DIAGNOSIS — E512 Wernicke's encephalopathy: Secondary | ICD-10-CM | POA: Diagnosis not present

## 2023-10-03 DIAGNOSIS — N179 Acute kidney failure, unspecified: Secondary | ICD-10-CM | POA: Diagnosis not present

## 2023-10-03 DIAGNOSIS — G9341 Metabolic encephalopathy: Secondary | ICD-10-CM | POA: Diagnosis not present

## 2023-10-04 DIAGNOSIS — N179 Acute kidney failure, unspecified: Secondary | ICD-10-CM | POA: Diagnosis not present

## 2023-10-04 DIAGNOSIS — G9341 Metabolic encephalopathy: Secondary | ICD-10-CM | POA: Diagnosis not present

## 2023-10-04 DIAGNOSIS — E512 Wernicke's encephalopathy: Secondary | ICD-10-CM | POA: Diagnosis not present

## 2023-10-05 DIAGNOSIS — N179 Acute kidney failure, unspecified: Secondary | ICD-10-CM | POA: Diagnosis not present

## 2023-10-05 DIAGNOSIS — E512 Wernicke's encephalopathy: Secondary | ICD-10-CM | POA: Diagnosis not present

## 2023-10-05 DIAGNOSIS — G9341 Metabolic encephalopathy: Secondary | ICD-10-CM | POA: Diagnosis not present

## 2023-10-06 DIAGNOSIS — G9341 Metabolic encephalopathy: Secondary | ICD-10-CM | POA: Diagnosis not present

## 2023-10-06 DIAGNOSIS — N179 Acute kidney failure, unspecified: Secondary | ICD-10-CM | POA: Diagnosis not present

## 2023-10-06 DIAGNOSIS — E512 Wernicke's encephalopathy: Secondary | ICD-10-CM | POA: Diagnosis not present

## 2023-10-07 DIAGNOSIS — E512 Wernicke's encephalopathy: Secondary | ICD-10-CM | POA: Diagnosis not present

## 2023-10-07 DIAGNOSIS — N179 Acute kidney failure, unspecified: Secondary | ICD-10-CM | POA: Diagnosis not present

## 2023-10-07 DIAGNOSIS — G9341 Metabolic encephalopathy: Secondary | ICD-10-CM | POA: Diagnosis not present

## 2023-10-08 DIAGNOSIS — N179 Acute kidney failure, unspecified: Secondary | ICD-10-CM | POA: Diagnosis not present

## 2023-10-08 DIAGNOSIS — G9341 Metabolic encephalopathy: Secondary | ICD-10-CM | POA: Diagnosis not present

## 2023-10-08 DIAGNOSIS — E512 Wernicke's encephalopathy: Secondary | ICD-10-CM | POA: Diagnosis not present

## 2023-10-09 DIAGNOSIS — N179 Acute kidney failure, unspecified: Secondary | ICD-10-CM | POA: Diagnosis not present

## 2023-10-09 DIAGNOSIS — E512 Wernicke's encephalopathy: Secondary | ICD-10-CM | POA: Diagnosis not present

## 2023-10-09 DIAGNOSIS — G9341 Metabolic encephalopathy: Secondary | ICD-10-CM | POA: Diagnosis not present

## 2023-10-10 DIAGNOSIS — N179 Acute kidney failure, unspecified: Secondary | ICD-10-CM | POA: Diagnosis not present

## 2023-10-10 DIAGNOSIS — E512 Wernicke's encephalopathy: Secondary | ICD-10-CM | POA: Diagnosis not present

## 2023-10-10 DIAGNOSIS — G9341 Metabolic encephalopathy: Secondary | ICD-10-CM | POA: Diagnosis not present

## 2023-10-11 DIAGNOSIS — G9341 Metabolic encephalopathy: Secondary | ICD-10-CM | POA: Diagnosis not present

## 2023-10-11 DIAGNOSIS — N179 Acute kidney failure, unspecified: Secondary | ICD-10-CM | POA: Diagnosis not present

## 2023-10-11 DIAGNOSIS — E512 Wernicke's encephalopathy: Secondary | ICD-10-CM | POA: Diagnosis not present

## 2023-10-12 DIAGNOSIS — G9341 Metabolic encephalopathy: Secondary | ICD-10-CM | POA: Diagnosis not present

## 2023-10-12 DIAGNOSIS — E512 Wernicke's encephalopathy: Secondary | ICD-10-CM | POA: Diagnosis not present

## 2023-10-12 DIAGNOSIS — N179 Acute kidney failure, unspecified: Secondary | ICD-10-CM | POA: Diagnosis not present

## 2023-10-13 DIAGNOSIS — G9341 Metabolic encephalopathy: Secondary | ICD-10-CM | POA: Diagnosis not present

## 2023-10-13 DIAGNOSIS — E512 Wernicke's encephalopathy: Secondary | ICD-10-CM | POA: Diagnosis not present

## 2023-10-13 DIAGNOSIS — N179 Acute kidney failure, unspecified: Secondary | ICD-10-CM | POA: Diagnosis not present

## 2023-10-14 DIAGNOSIS — E512 Wernicke's encephalopathy: Secondary | ICD-10-CM | POA: Diagnosis not present

## 2023-10-14 DIAGNOSIS — G9341 Metabolic encephalopathy: Secondary | ICD-10-CM | POA: Diagnosis not present

## 2023-10-14 DIAGNOSIS — N179 Acute kidney failure, unspecified: Secondary | ICD-10-CM | POA: Diagnosis not present

## 2023-10-15 DIAGNOSIS — E512 Wernicke's encephalopathy: Secondary | ICD-10-CM | POA: Diagnosis not present

## 2023-10-15 DIAGNOSIS — G9341 Metabolic encephalopathy: Secondary | ICD-10-CM | POA: Diagnosis not present

## 2023-10-15 DIAGNOSIS — N179 Acute kidney failure, unspecified: Secondary | ICD-10-CM | POA: Diagnosis not present

## 2023-10-16 DIAGNOSIS — N179 Acute kidney failure, unspecified: Secondary | ICD-10-CM | POA: Diagnosis not present

## 2023-10-16 DIAGNOSIS — G9341 Metabolic encephalopathy: Secondary | ICD-10-CM | POA: Diagnosis not present

## 2023-10-16 DIAGNOSIS — E512 Wernicke's encephalopathy: Secondary | ICD-10-CM | POA: Diagnosis not present

## 2023-10-17 DIAGNOSIS — N179 Acute kidney failure, unspecified: Secondary | ICD-10-CM | POA: Diagnosis not present

## 2023-10-17 DIAGNOSIS — G9341 Metabolic encephalopathy: Secondary | ICD-10-CM | POA: Diagnosis not present

## 2023-10-17 DIAGNOSIS — E512 Wernicke's encephalopathy: Secondary | ICD-10-CM | POA: Diagnosis not present

## 2023-10-18 DIAGNOSIS — E512 Wernicke's encephalopathy: Secondary | ICD-10-CM | POA: Diagnosis not present

## 2023-10-18 DIAGNOSIS — N179 Acute kidney failure, unspecified: Secondary | ICD-10-CM | POA: Diagnosis not present

## 2023-10-18 DIAGNOSIS — G9341 Metabolic encephalopathy: Secondary | ICD-10-CM | POA: Diagnosis not present

## 2023-10-19 DIAGNOSIS — N179 Acute kidney failure, unspecified: Secondary | ICD-10-CM | POA: Diagnosis not present

## 2023-10-19 DIAGNOSIS — G9341 Metabolic encephalopathy: Secondary | ICD-10-CM | POA: Diagnosis not present

## 2023-10-19 DIAGNOSIS — E512 Wernicke's encephalopathy: Secondary | ICD-10-CM | POA: Diagnosis not present

## 2023-10-20 DIAGNOSIS — G9341 Metabolic encephalopathy: Secondary | ICD-10-CM | POA: Diagnosis not present

## 2023-10-20 DIAGNOSIS — E512 Wernicke's encephalopathy: Secondary | ICD-10-CM | POA: Diagnosis not present

## 2023-10-20 DIAGNOSIS — N179 Acute kidney failure, unspecified: Secondary | ICD-10-CM | POA: Diagnosis not present

## 2023-10-21 DIAGNOSIS — N179 Acute kidney failure, unspecified: Secondary | ICD-10-CM | POA: Diagnosis not present

## 2023-10-21 DIAGNOSIS — E512 Wernicke's encephalopathy: Secondary | ICD-10-CM | POA: Diagnosis not present

## 2023-10-21 DIAGNOSIS — G9341 Metabolic encephalopathy: Secondary | ICD-10-CM | POA: Diagnosis not present

## 2023-10-22 DIAGNOSIS — E512 Wernicke's encephalopathy: Secondary | ICD-10-CM | POA: Diagnosis not present

## 2023-10-22 DIAGNOSIS — G9341 Metabolic encephalopathy: Secondary | ICD-10-CM | POA: Diagnosis not present

## 2023-10-22 DIAGNOSIS — N179 Acute kidney failure, unspecified: Secondary | ICD-10-CM | POA: Diagnosis not present

## 2023-10-23 DIAGNOSIS — E512 Wernicke's encephalopathy: Secondary | ICD-10-CM | POA: Diagnosis not present

## 2023-10-23 DIAGNOSIS — G9341 Metabolic encephalopathy: Secondary | ICD-10-CM | POA: Diagnosis not present

## 2023-10-23 DIAGNOSIS — N179 Acute kidney failure, unspecified: Secondary | ICD-10-CM | POA: Diagnosis not present

## 2023-10-24 DIAGNOSIS — N179 Acute kidney failure, unspecified: Secondary | ICD-10-CM | POA: Diagnosis not present

## 2023-10-24 DIAGNOSIS — E512 Wernicke's encephalopathy: Secondary | ICD-10-CM | POA: Diagnosis not present

## 2023-10-24 DIAGNOSIS — G9341 Metabolic encephalopathy: Secondary | ICD-10-CM | POA: Diagnosis not present

## 2023-10-25 DIAGNOSIS — G9341 Metabolic encephalopathy: Secondary | ICD-10-CM | POA: Diagnosis not present

## 2023-10-25 DIAGNOSIS — N179 Acute kidney failure, unspecified: Secondary | ICD-10-CM | POA: Diagnosis not present

## 2023-10-25 DIAGNOSIS — E512 Wernicke's encephalopathy: Secondary | ICD-10-CM | POA: Diagnosis not present

## 2023-10-26 DIAGNOSIS — E512 Wernicke's encephalopathy: Secondary | ICD-10-CM | POA: Diagnosis not present

## 2023-10-26 DIAGNOSIS — N179 Acute kidney failure, unspecified: Secondary | ICD-10-CM | POA: Diagnosis not present

## 2023-10-26 DIAGNOSIS — G9341 Metabolic encephalopathy: Secondary | ICD-10-CM | POA: Diagnosis not present

## 2023-10-27 DIAGNOSIS — R7989 Other specified abnormal findings of blood chemistry: Secondary | ICD-10-CM | POA: Diagnosis not present

## 2023-10-27 DIAGNOSIS — R768 Other specified abnormal immunological findings in serum: Secondary | ICD-10-CM | POA: Diagnosis not present

## 2023-10-27 DIAGNOSIS — I10 Essential (primary) hypertension: Secondary | ICD-10-CM | POA: Diagnosis not present

## 2023-10-27 DIAGNOSIS — E512 Wernicke's encephalopathy: Secondary | ICD-10-CM | POA: Diagnosis not present

## 2023-10-27 DIAGNOSIS — L299 Pruritus, unspecified: Secondary | ICD-10-CM | POA: Diagnosis not present

## 2023-10-27 DIAGNOSIS — G9341 Metabolic encephalopathy: Secondary | ICD-10-CM | POA: Diagnosis not present

## 2023-10-27 DIAGNOSIS — N179 Acute kidney failure, unspecified: Secondary | ICD-10-CM | POA: Diagnosis not present

## 2023-10-27 DIAGNOSIS — F1910 Other psychoactive substance abuse, uncomplicated: Secondary | ICD-10-CM | POA: Diagnosis not present

## 2023-10-27 DIAGNOSIS — K8020 Calculus of gallbladder without cholecystitis without obstruction: Secondary | ICD-10-CM | POA: Diagnosis not present

## 2023-10-28 DIAGNOSIS — K8020 Calculus of gallbladder without cholecystitis without obstruction: Secondary | ICD-10-CM | POA: Diagnosis not present

## 2023-10-28 DIAGNOSIS — E512 Wernicke's encephalopathy: Secondary | ICD-10-CM | POA: Diagnosis not present

## 2023-10-28 DIAGNOSIS — R7989 Other specified abnormal findings of blood chemistry: Secondary | ICD-10-CM | POA: Diagnosis not present

## 2023-10-28 DIAGNOSIS — R768 Other specified abnormal immunological findings in serum: Secondary | ICD-10-CM | POA: Diagnosis not present

## 2023-10-28 DIAGNOSIS — N179 Acute kidney failure, unspecified: Secondary | ICD-10-CM | POA: Diagnosis not present

## 2023-10-28 DIAGNOSIS — F1910 Other psychoactive substance abuse, uncomplicated: Secondary | ICD-10-CM | POA: Diagnosis not present

## 2023-10-28 DIAGNOSIS — I10 Essential (primary) hypertension: Secondary | ICD-10-CM | POA: Diagnosis not present

## 2023-10-28 DIAGNOSIS — G9341 Metabolic encephalopathy: Secondary | ICD-10-CM | POA: Diagnosis not present

## 2023-10-29 DIAGNOSIS — N179 Acute kidney failure, unspecified: Secondary | ICD-10-CM | POA: Diagnosis not present

## 2023-10-29 DIAGNOSIS — G9341 Metabolic encephalopathy: Secondary | ICD-10-CM | POA: Diagnosis not present

## 2023-10-29 DIAGNOSIS — K8020 Calculus of gallbladder without cholecystitis without obstruction: Secondary | ICD-10-CM | POA: Diagnosis not present

## 2023-10-29 DIAGNOSIS — E512 Wernicke's encephalopathy: Secondary | ICD-10-CM | POA: Diagnosis not present

## 2023-10-30 DIAGNOSIS — F1910 Other psychoactive substance abuse, uncomplicated: Secondary | ICD-10-CM | POA: Diagnosis not present

## 2023-10-30 DIAGNOSIS — R768 Other specified abnormal immunological findings in serum: Secondary | ICD-10-CM | POA: Diagnosis not present

## 2023-10-30 DIAGNOSIS — R7989 Other specified abnormal findings of blood chemistry: Secondary | ICD-10-CM | POA: Diagnosis not present

## 2023-10-30 DIAGNOSIS — N179 Acute kidney failure, unspecified: Secondary | ICD-10-CM | POA: Diagnosis not present

## 2023-10-30 DIAGNOSIS — G9341 Metabolic encephalopathy: Secondary | ICD-10-CM | POA: Diagnosis not present

## 2023-10-30 DIAGNOSIS — I10 Essential (primary) hypertension: Secondary | ICD-10-CM | POA: Diagnosis not present

## 2023-10-30 DIAGNOSIS — E512 Wernicke's encephalopathy: Secondary | ICD-10-CM | POA: Diagnosis not present

## 2023-10-31 DIAGNOSIS — G9341 Metabolic encephalopathy: Secondary | ICD-10-CM | POA: Diagnosis not present

## 2023-10-31 DIAGNOSIS — E512 Wernicke's encephalopathy: Secondary | ICD-10-CM | POA: Diagnosis not present

## 2023-10-31 DIAGNOSIS — N179 Acute kidney failure, unspecified: Secondary | ICD-10-CM | POA: Diagnosis not present

## 2023-11-01 DIAGNOSIS — R1011 Right upper quadrant pain: Secondary | ICD-10-CM | POA: Diagnosis not present

## 2023-11-01 DIAGNOSIS — R7989 Other specified abnormal findings of blood chemistry: Secondary | ICD-10-CM | POA: Diagnosis not present

## 2023-11-01 DIAGNOSIS — N179 Acute kidney failure, unspecified: Secondary | ICD-10-CM | POA: Diagnosis not present

## 2023-11-01 DIAGNOSIS — G9341 Metabolic encephalopathy: Secondary | ICD-10-CM | POA: Diagnosis not present

## 2023-11-01 DIAGNOSIS — E512 Wernicke's encephalopathy: Secondary | ICD-10-CM | POA: Diagnosis not present

## 2023-11-02 DIAGNOSIS — R7989 Other specified abnormal findings of blood chemistry: Secondary | ICD-10-CM | POA: Diagnosis not present

## 2023-11-02 DIAGNOSIS — E512 Wernicke's encephalopathy: Secondary | ICD-10-CM | POA: Diagnosis not present

## 2023-11-02 DIAGNOSIS — N179 Acute kidney failure, unspecified: Secondary | ICD-10-CM | POA: Diagnosis not present

## 2023-11-02 DIAGNOSIS — G9341 Metabolic encephalopathy: Secondary | ICD-10-CM | POA: Diagnosis not present

## 2023-11-02 DIAGNOSIS — I10 Essential (primary) hypertension: Secondary | ICD-10-CM | POA: Diagnosis not present

## 2023-11-02 DIAGNOSIS — F1910 Other psychoactive substance abuse, uncomplicated: Secondary | ICD-10-CM | POA: Diagnosis not present

## 2023-11-02 DIAGNOSIS — R768 Other specified abnormal immunological findings in serum: Secondary | ICD-10-CM | POA: Diagnosis not present

## 2023-11-03 DIAGNOSIS — N179 Acute kidney failure, unspecified: Secondary | ICD-10-CM | POA: Diagnosis not present

## 2023-11-03 DIAGNOSIS — E512 Wernicke's encephalopathy: Secondary | ICD-10-CM | POA: Diagnosis not present

## 2023-11-03 DIAGNOSIS — R7989 Other specified abnormal findings of blood chemistry: Secondary | ICD-10-CM | POA: Diagnosis not present

## 2023-11-03 DIAGNOSIS — K8020 Calculus of gallbladder without cholecystitis without obstruction: Secondary | ICD-10-CM | POA: Diagnosis not present

## 2023-11-03 DIAGNOSIS — G9341 Metabolic encephalopathy: Secondary | ICD-10-CM | POA: Diagnosis not present

## 2023-11-04 DIAGNOSIS — G9341 Metabolic encephalopathy: Secondary | ICD-10-CM | POA: Diagnosis not present

## 2023-11-04 DIAGNOSIS — E512 Wernicke's encephalopathy: Secondary | ICD-10-CM | POA: Diagnosis not present

## 2023-11-04 DIAGNOSIS — N179 Acute kidney failure, unspecified: Secondary | ICD-10-CM | POA: Diagnosis not present

## 2023-11-05 DIAGNOSIS — E512 Wernicke's encephalopathy: Secondary | ICD-10-CM | POA: Diagnosis not present

## 2023-11-05 DIAGNOSIS — G9341 Metabolic encephalopathy: Secondary | ICD-10-CM | POA: Diagnosis not present

## 2023-11-05 DIAGNOSIS — N179 Acute kidney failure, unspecified: Secondary | ICD-10-CM | POA: Diagnosis not present

## 2023-11-06 DIAGNOSIS — N179 Acute kidney failure, unspecified: Secondary | ICD-10-CM | POA: Diagnosis not present

## 2023-11-06 DIAGNOSIS — E512 Wernicke's encephalopathy: Secondary | ICD-10-CM | POA: Diagnosis not present

## 2023-11-06 DIAGNOSIS — G9341 Metabolic encephalopathy: Secondary | ICD-10-CM | POA: Diagnosis not present

## 2023-11-07 DIAGNOSIS — E512 Wernicke's encephalopathy: Secondary | ICD-10-CM | POA: Diagnosis not present

## 2023-11-07 DIAGNOSIS — G9341 Metabolic encephalopathy: Secondary | ICD-10-CM | POA: Diagnosis not present

## 2023-11-07 DIAGNOSIS — N179 Acute kidney failure, unspecified: Secondary | ICD-10-CM | POA: Diagnosis not present

## 2023-11-08 DIAGNOSIS — N179 Acute kidney failure, unspecified: Secondary | ICD-10-CM | POA: Diagnosis not present

## 2023-11-08 DIAGNOSIS — G9341 Metabolic encephalopathy: Secondary | ICD-10-CM | POA: Diagnosis not present

## 2023-11-08 DIAGNOSIS — E512 Wernicke's encephalopathy: Secondary | ICD-10-CM | POA: Diagnosis not present

## 2023-11-09 DIAGNOSIS — G9341 Metabolic encephalopathy: Secondary | ICD-10-CM | POA: Diagnosis not present

## 2023-11-09 DIAGNOSIS — N179 Acute kidney failure, unspecified: Secondary | ICD-10-CM | POA: Diagnosis not present

## 2023-11-09 DIAGNOSIS — E512 Wernicke's encephalopathy: Secondary | ICD-10-CM | POA: Diagnosis not present

## 2023-11-10 DIAGNOSIS — E512 Wernicke's encephalopathy: Secondary | ICD-10-CM | POA: Diagnosis not present

## 2023-11-10 DIAGNOSIS — N179 Acute kidney failure, unspecified: Secondary | ICD-10-CM | POA: Diagnosis not present

## 2023-11-10 DIAGNOSIS — G9341 Metabolic encephalopathy: Secondary | ICD-10-CM | POA: Diagnosis not present

## 2023-11-11 DIAGNOSIS — N179 Acute kidney failure, unspecified: Secondary | ICD-10-CM | POA: Diagnosis not present

## 2023-11-11 DIAGNOSIS — G9341 Metabolic encephalopathy: Secondary | ICD-10-CM | POA: Diagnosis not present

## 2023-11-11 DIAGNOSIS — E512 Wernicke's encephalopathy: Secondary | ICD-10-CM | POA: Diagnosis not present

## 2023-11-12 DIAGNOSIS — E512 Wernicke's encephalopathy: Secondary | ICD-10-CM | POA: Diagnosis not present

## 2023-11-12 DIAGNOSIS — G9341 Metabolic encephalopathy: Secondary | ICD-10-CM | POA: Diagnosis not present

## 2023-11-12 DIAGNOSIS — N179 Acute kidney failure, unspecified: Secondary | ICD-10-CM | POA: Diagnosis not present

## 2023-11-13 DIAGNOSIS — N179 Acute kidney failure, unspecified: Secondary | ICD-10-CM | POA: Diagnosis not present

## 2023-11-13 DIAGNOSIS — G9341 Metabolic encephalopathy: Secondary | ICD-10-CM | POA: Diagnosis not present

## 2023-11-13 DIAGNOSIS — E512 Wernicke's encephalopathy: Secondary | ICD-10-CM | POA: Diagnosis not present

## 2023-11-14 DIAGNOSIS — E512 Wernicke's encephalopathy: Secondary | ICD-10-CM | POA: Diagnosis not present

## 2023-11-14 DIAGNOSIS — G9341 Metabolic encephalopathy: Secondary | ICD-10-CM | POA: Diagnosis not present

## 2023-11-14 DIAGNOSIS — N179 Acute kidney failure, unspecified: Secondary | ICD-10-CM | POA: Diagnosis not present

## 2023-11-15 DIAGNOSIS — R7989 Other specified abnormal findings of blood chemistry: Secondary | ICD-10-CM | POA: Diagnosis not present

## 2023-11-15 DIAGNOSIS — N179 Acute kidney failure, unspecified: Secondary | ICD-10-CM | POA: Diagnosis not present

## 2023-11-15 DIAGNOSIS — F1910 Other psychoactive substance abuse, uncomplicated: Secondary | ICD-10-CM | POA: Diagnosis not present

## 2023-11-15 DIAGNOSIS — E512 Wernicke's encephalopathy: Secondary | ICD-10-CM | POA: Diagnosis not present

## 2023-11-15 DIAGNOSIS — I10 Essential (primary) hypertension: Secondary | ICD-10-CM | POA: Diagnosis not present

## 2023-11-15 DIAGNOSIS — G9341 Metabolic encephalopathy: Secondary | ICD-10-CM | POA: Diagnosis not present

## 2023-11-15 DIAGNOSIS — R768 Other specified abnormal immunological findings in serum: Secondary | ICD-10-CM | POA: Diagnosis not present

## 2023-11-16 DIAGNOSIS — G9341 Metabolic encephalopathy: Secondary | ICD-10-CM | POA: Diagnosis not present

## 2023-11-16 DIAGNOSIS — N179 Acute kidney failure, unspecified: Secondary | ICD-10-CM | POA: Diagnosis not present

## 2023-11-16 DIAGNOSIS — E512 Wernicke's encephalopathy: Secondary | ICD-10-CM | POA: Diagnosis not present

## 2023-11-17 DIAGNOSIS — G9341 Metabolic encephalopathy: Secondary | ICD-10-CM | POA: Diagnosis not present

## 2023-11-17 DIAGNOSIS — E512 Wernicke's encephalopathy: Secondary | ICD-10-CM | POA: Diagnosis not present

## 2023-11-17 DIAGNOSIS — N179 Acute kidney failure, unspecified: Secondary | ICD-10-CM | POA: Diagnosis not present

## 2023-11-18 DIAGNOSIS — N179 Acute kidney failure, unspecified: Secondary | ICD-10-CM | POA: Diagnosis not present

## 2023-11-18 DIAGNOSIS — G9341 Metabolic encephalopathy: Secondary | ICD-10-CM | POA: Diagnosis not present

## 2023-11-18 DIAGNOSIS — E512 Wernicke's encephalopathy: Secondary | ICD-10-CM | POA: Diagnosis not present

## 2023-11-19 DIAGNOSIS — N179 Acute kidney failure, unspecified: Secondary | ICD-10-CM | POA: Diagnosis not present

## 2023-11-19 DIAGNOSIS — G9341 Metabolic encephalopathy: Secondary | ICD-10-CM | POA: Diagnosis not present

## 2023-11-19 DIAGNOSIS — E512 Wernicke's encephalopathy: Secondary | ICD-10-CM | POA: Diagnosis not present

## 2023-11-20 DIAGNOSIS — G9341 Metabolic encephalopathy: Secondary | ICD-10-CM | POA: Diagnosis not present

## 2023-11-20 DIAGNOSIS — E512 Wernicke's encephalopathy: Secondary | ICD-10-CM | POA: Diagnosis not present

## 2023-11-20 DIAGNOSIS — N179 Acute kidney failure, unspecified: Secondary | ICD-10-CM | POA: Diagnosis not present

## 2023-11-21 DIAGNOSIS — G9341 Metabolic encephalopathy: Secondary | ICD-10-CM | POA: Diagnosis not present

## 2023-11-21 DIAGNOSIS — E512 Wernicke's encephalopathy: Secondary | ICD-10-CM | POA: Diagnosis not present

## 2023-11-21 DIAGNOSIS — N179 Acute kidney failure, unspecified: Secondary | ICD-10-CM | POA: Diagnosis not present

## 2023-11-22 DIAGNOSIS — G9341 Metabolic encephalopathy: Secondary | ICD-10-CM | POA: Diagnosis not present

## 2023-11-22 DIAGNOSIS — N179 Acute kidney failure, unspecified: Secondary | ICD-10-CM | POA: Diagnosis not present

## 2023-11-22 DIAGNOSIS — E512 Wernicke's encephalopathy: Secondary | ICD-10-CM | POA: Diagnosis not present

## 2023-11-23 DIAGNOSIS — N179 Acute kidney failure, unspecified: Secondary | ICD-10-CM | POA: Diagnosis not present

## 2023-11-23 DIAGNOSIS — E512 Wernicke's encephalopathy: Secondary | ICD-10-CM | POA: Diagnosis not present

## 2023-11-23 DIAGNOSIS — G9341 Metabolic encephalopathy: Secondary | ICD-10-CM | POA: Diagnosis not present

## 2023-11-24 DIAGNOSIS — E512 Wernicke's encephalopathy: Secondary | ICD-10-CM | POA: Diagnosis not present

## 2023-11-24 DIAGNOSIS — N179 Acute kidney failure, unspecified: Secondary | ICD-10-CM | POA: Diagnosis not present

## 2023-11-24 DIAGNOSIS — G9341 Metabolic encephalopathy: Secondary | ICD-10-CM | POA: Diagnosis not present

## 2023-11-25 DIAGNOSIS — N179 Acute kidney failure, unspecified: Secondary | ICD-10-CM | POA: Diagnosis not present

## 2023-11-25 DIAGNOSIS — G9341 Metabolic encephalopathy: Secondary | ICD-10-CM | POA: Diagnosis not present

## 2023-11-25 DIAGNOSIS — E512 Wernicke's encephalopathy: Secondary | ICD-10-CM | POA: Diagnosis not present

## 2023-11-26 DIAGNOSIS — G9341 Metabolic encephalopathy: Secondary | ICD-10-CM | POA: Diagnosis not present

## 2023-11-26 DIAGNOSIS — E512 Wernicke's encephalopathy: Secondary | ICD-10-CM | POA: Diagnosis not present

## 2023-11-26 DIAGNOSIS — N179 Acute kidney failure, unspecified: Secondary | ICD-10-CM | POA: Diagnosis not present

## 2023-11-27 DIAGNOSIS — N179 Acute kidney failure, unspecified: Secondary | ICD-10-CM | POA: Diagnosis not present

## 2023-11-27 DIAGNOSIS — G9341 Metabolic encephalopathy: Secondary | ICD-10-CM | POA: Diagnosis not present

## 2023-11-27 DIAGNOSIS — E512 Wernicke's encephalopathy: Secondary | ICD-10-CM | POA: Diagnosis not present

## 2023-12-06 DIAGNOSIS — Z7401 Bed confinement status: Secondary | ICD-10-CM | POA: Diagnosis not present

## 2023-12-06 DIAGNOSIS — R531 Weakness: Secondary | ICD-10-CM | POA: Diagnosis not present

## 2023-12-06 DIAGNOSIS — E512 Wernicke's encephalopathy: Secondary | ICD-10-CM | POA: Diagnosis not present

## 2023-12-06 DIAGNOSIS — G9341 Metabolic encephalopathy: Secondary | ICD-10-CM | POA: Diagnosis not present

## 2023-12-06 DIAGNOSIS — N179 Acute kidney failure, unspecified: Secondary | ICD-10-CM | POA: Diagnosis not present

## 2023-12-14 DIAGNOSIS — F411 Generalized anxiety disorder: Secondary | ICD-10-CM | POA: Diagnosis not present

## 2023-12-14 DIAGNOSIS — F1011 Alcohol abuse, in remission: Secondary | ICD-10-CM | POA: Diagnosis not present

## 2023-12-14 DIAGNOSIS — F1111 Opioid abuse, in remission: Secondary | ICD-10-CM | POA: Diagnosis not present

## 2023-12-14 DIAGNOSIS — F331 Major depressive disorder, recurrent, moderate: Secondary | ICD-10-CM | POA: Diagnosis not present

## 2023-12-18 DIAGNOSIS — L309 Dermatitis, unspecified: Secondary | ICD-10-CM | POA: Diagnosis not present

## 2023-12-18 DIAGNOSIS — R21 Rash and other nonspecific skin eruption: Secondary | ICD-10-CM | POA: Diagnosis not present

## 2023-12-19 DIAGNOSIS — L309 Dermatitis, unspecified: Secondary | ICD-10-CM | POA: Diagnosis not present

## 2023-12-21 DIAGNOSIS — R21 Rash and other nonspecific skin eruption: Secondary | ICD-10-CM | POA: Diagnosis not present

## 2023-12-21 DIAGNOSIS — L299 Pruritus, unspecified: Secondary | ICD-10-CM | POA: Diagnosis not present

## 2023-12-21 DIAGNOSIS — Z20822 Contact with and (suspected) exposure to covid-19: Secondary | ICD-10-CM | POA: Diagnosis not present

## 2023-12-21 DIAGNOSIS — Z5900 Homelessness unspecified: Secondary | ICD-10-CM | POA: Diagnosis not present

## 2023-12-27 DIAGNOSIS — Z599 Problem related to housing and economic circumstances, unspecified: Secondary | ICD-10-CM | POA: Diagnosis not present

## 2024-01-18 DIAGNOSIS — I361 Nonrheumatic tricuspid (valve) insufficiency: Secondary | ICD-10-CM | POA: Diagnosis not present

## 2024-07-25 ENCOUNTER — Other Ambulatory Visit: Payer: Self-pay

## 2024-07-25 DIAGNOSIS — F1021 Alcohol dependence, in remission: Secondary | ICD-10-CM | POA: Insufficient documentation

## 2024-07-25 DIAGNOSIS — R5383 Other fatigue: Secondary | ICD-10-CM | POA: Insufficient documentation

## 2024-07-25 DIAGNOSIS — N289 Disorder of kidney and ureter, unspecified: Secondary | ICD-10-CM | POA: Insufficient documentation

## 2024-07-25 DIAGNOSIS — Z6824 Body mass index (BMI) 24.0-24.9, adult: Secondary | ICD-10-CM | POA: Insufficient documentation

## 2024-07-25 DIAGNOSIS — M549 Dorsalgia, unspecified: Secondary | ICD-10-CM | POA: Insufficient documentation

## 2024-07-25 DIAGNOSIS — L299 Pruritus, unspecified: Secondary | ICD-10-CM | POA: Insufficient documentation

## 2024-07-25 DIAGNOSIS — J309 Allergic rhinitis, unspecified: Secondary | ICD-10-CM | POA: Insufficient documentation

## 2024-07-25 DIAGNOSIS — B1920 Unspecified viral hepatitis C without hepatic coma: Secondary | ICD-10-CM | POA: Insufficient documentation

## 2024-07-25 DIAGNOSIS — I10 Essential (primary) hypertension: Secondary | ICD-10-CM | POA: Insufficient documentation

## 2024-07-25 DIAGNOSIS — I4891 Unspecified atrial fibrillation: Secondary | ICD-10-CM | POA: Insufficient documentation

## 2024-07-25 DIAGNOSIS — F1010 Alcohol abuse, uncomplicated: Secondary | ICD-10-CM | POA: Insufficient documentation

## 2024-07-26 ENCOUNTER — Ambulatory Visit

## 2024-07-26 VITALS — BP 116/84 | HR 75 | Ht 68.0 in | Wt 150.4 lb

## 2024-07-26 DIAGNOSIS — I10 Essential (primary) hypertension: Secondary | ICD-10-CM | POA: Insufficient documentation

## 2024-07-26 DIAGNOSIS — I4891 Unspecified atrial fibrillation: Secondary | ICD-10-CM | POA: Diagnosis present

## 2024-07-26 NOTE — Patient Instructions (Signed)
 Medication Instructions:  Your physician recommends that you continue on your current medications as directed. Please refer to the Current Medication list given to you today.  *If you need a refill on your cardiac medications before your next appointment, please call your pharmacy*  Lab Work: None If you have labs (blood work) drawn today and your tests are completely normal, you will receive your results only by: MyChart Message (if you have MyChart) OR A paper copy in the mail If you have any lab test that is abnormal or we need to change your treatment, we will call you to review the results.  Testing/Procedures: A zio monitor was ordered today. It will remain on for 14 days. You will then return monitor and event diary in provided box. It takes 1-2 weeks for report to be downloaded and returned to us . We will call you with the results. If monitor falls off or has orange flashing light, please call Zio for further instructions.   Follow-Up: At [facility], you and your health needs are our priority.  As part of our continuing mission to provide you with exceptional heart care, our providers are all part of one team.  This team includes your primary Cardiologist (physician) and Advanced Practice Providers or APPs (Physician Assistants and Nurse Practitioners) who all work together to provide you with the care you need, when you need it.  Your next appointment:   3 month(s)  Provider:   Alean Kobus, MD    We recommend signing up for the patient portal called MyChart.  Sign up information is provided on this After Visit Summary.  MyChart is used to connect with patients for Virtual Visits (Telemedicine).  Patients are able to view lab/test results, encounter notes, upcoming appointments, etc.  Non-urgent messages can be sent to your provider as well.   To learn more about what you can do with MyChart, go to [email or web address].   Other Instructions None

## 2024-07-26 NOTE — Assessment & Plan Note (Signed)
 Well-controlled. Continue amlodipine  10 mg once daily Continue metoprolol  succinate 25 mg once daily. Target blood pressure below 130/80 mmHg.

## 2024-07-26 NOTE — Progress Notes (Unsigned)
 Cardiology Consultation:    Date:  07/26/2024   ID:  Tyler [name], DOB Jul 12, 1971, MRN [id number]  PCP:  Lenon Homer, MD  Cardiologist:  Alean SAUNDERS Mackensie Pilson, MD   Referring MD: Jama Chow, MD   No chief complaint on file.    ASSESSMENT AND PLAN:   [name] 53 year old male with history of chronic hepatitis C [pending evaluation with hepatologist for initiating treatment], hypertension, fatty and allergic rhinitis, chronic alcoholism [now sober for the past 6 months since March 2025], with history of Wernicke Korsakoff syndrome, smokes tobacco up to a pack a day, paroxysmal atrial fibrillation [last episode in March 2025 while admitted at [facility] ] not on anticoagulation due to compliance issues and fall risk Transthoracic echocardiogram 01/18/2024 at [facility] reported normal biventricular function with LVEF 55 to 60% with underlying rhythm of atrial fibrillation, mildly dilated left atrium and moderately dilated right atrium, mild TR, normal RVSP  Here to establish care in the setting of atrial fibrillation.  Problem List Items Addressed This Visit     Hypertension - Primary   Well-controlled. Continue amlodipine  10 mg once daily Continue metoprolol  succinate 25 mg once daily. Target blood pressure below 130/80 mmHg.       Relevant Orders   EKG 12-Lead (Completed)   Atrial fibrillation (HCC)   Couple episodes of atrial fibrillation in the past when acutely ill with alcohol  intoxication in the past year. Last episode February 2025 while admitted at [facility]. Currently remains in sinus rhythm. Asymptomatic.  CHA2DS2-VASc score 1 [hypertension]. Not on anticoagulation due to high risk for bleeding in the setting of alcohol  abuse, nonadherence and fall risk. He has since remained abstinent from alcohol , has had good adherence with his ART managing his medications and has not sustained any falls in the past 6  months. However his overall CHA2DS2-VASc score stroke risk is small. A-fib episodes were likely in the setting of acute alcoholic intoxication.  Will assess overall burden of A-fib with a 14-day Zio patch. Will await evaluation by liver specialist in the context of his chronic hepatitis C and chronic alcohol  abuse history, anticipate he will undergo EGD.  If he does have significant A-fib burden over recommend anticoagulation so long as his bleeding risk is considered to be low.       Relevant Orders   EKG 12-Lead (Completed)   LONG TERM MONITOR (3-14 DAYS)   Return to clinic tentatively in 3 months.   History of Present Illness:    Tyler [name] is a 53 y.o. male who is being seen today for the evaluation of atrial fibrillation at the request of Jama Chow, MD.  Here for the visit today accompanied by his aunt.  He lives at his aunt's place and has been sober for the past 6 months and she helps manage his medications.  History of chronic hepatitis C [pending evaluation with hepatologist for initiating treatment], hypertension, fatty and allergic rhinitis, chronic alcoholism [now sober for the past 6 months since March 2025], with history of Wernicke Korsakoff syndrome, smokes tobacco up to a pack a day, paroxysmal atrial fibrillation [last episode in March 2025 while admitted at [facility] ] not on anticoagulation due to compliance issues and fall risk Transthoracic echocardiogram 01/18/2024 at [facility] reported normal biventricular function with LVEF 55 to 60% with underlying rhythm of atrial fibrillation, mildly dilated left atrium and moderately dilated right atrium, mild TR, normal RVSP  EKG in the clinic today shows  sinus rhythm heart rate 75/min, PR interval normal 156 ms, QRS duration 78 ms, borderline prolonged QTc 500 ms  Blood work from 07/23/2024 hemoglobin 15.4, hematocrit 45.3, platelets 300 TSH normal 3.57  Overall mentions he has been  doing well for the past 6 months avoiding alcohol . Able to walk up to a mile back-and-forth at home without any limitations. Denies any cardiac symptoms such as chest pain, shortness of breath, orthopnea or paroxysmal nocturnal dyspnea.  No palpitations, lightheadedness, dizziness or syncopal episodes. No blood in urine or stools.  For blood pressure has been on amlodipine  and metoprolol  and compliant with his medications and tolerating well.  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/2019   Cocaine use 04/21/2012   Fall 04/21/2012   Fatigue    Hepatitis C    Hyperkalemia 07/23/2019   Hypertension    Hypokalemia 09/13/2023   ICC 04/21/2012   Kidney disease    Left maxillary fracture (HCC) 04/21/2012   Left orbit fracture (HCC) 04/21/2012   Paranoid (HCC) 09/13/2023   Polysubstance abuse (HCC) 07/23/2019   Protein-calorie malnutrition, severe 08/09/2019   Pruritus    Rhabdomyolysis 07/23/2019   Sepsis (HCC) 09/13/2023   Transaminitis 07/23/2019   Traumatic subdural hematoma (HCC) 04/21/2012   Wound cellulitis 09/13/2023    Past Surgical History:  Procedure Laterality Date   MANDIBLE SURGERY     NERVE, TENDON AND ARTERY REPAIR Left 10/07/2014   Procedure: ORIF PROXIMAL THUMB REPAIR OF EPL AND FLP TENDON MICROSCOPIC NERVE, TENDON AND RADIAL ARTERY REPAIR WITH WOUND EXPLORATION;  Surgeon: Donnice Robinsons, MD;  Location: [facility] OR;  Service: Orthopedics;  Laterality: Left;  OEC, MICROSCOPE    Current Medications: Current Meds  Medication Sig   amLODipine  (NORVASC ) 10 MG tablet Take 10 mg by mouth daily.   ASPIRIN  LOW DOSE 81 MG chewable tablet  Chew 81 mg by mouth daily.   BANOPHEN 25 MG capsule Take 25 mg by mouth daily.   cetirizine (ZYRTEC) 10 MG tablet Take 10 mg by mouth daily.   metoprolol  succinate (TOPROL -XL) 25 MG 24 hr tablet Take 25 mg by mouth daily.   thiamine  (VITAMIN B1) 100 MG tablet Take 100 mg by mouth daily.     Allergies:   Patient has no known allergies.   Social History   Socioeconomic History   Marital status: Married    Spouse name: Not on file   Number of children: Not on file   Years of education: Not on file   Highest education level: Not on file  Occupational History   Not on file  Tobacco Use   Smoking status: Every Day    Current packs/day: 1.00    Types: Cigarettes   Smokeless tobacco: Current  Vaping Use   Vaping status: Not on file  Substance and Sexual Activity   Alcohol  use: Not Currently    Alcohol /week: 2.0 standard drinks of alcohol     Types: 2 Cans of beer per week   Drug use: Not Currently   Sexual activity: Not on file  Other Topics Concern   Not on file  Social History Narrative  Not on file   Social Drivers of Health   Financial Resource Strain: Not at Risk (05/28/2024)   Received from [facility] Strain    How hard is it for you to pay for the very basics like food, housing, heating, medical care, and medications?: 1  Food Insecurity: Not at Risk (05/28/2024)   Received from [facility] Insecurity    Within the past 12 months, you worried that your food would run out before you got money to buy more.: 1  Transportation Needs: Not at Risk (05/28/2024)   Received from [facility] Needs    In the past 12 months, has lack of transportation kept you from medical appointments, meetings, work or from getting things needed for daily living?: 1  Physical Activity: At Risk (05/28/2024)   Received from [facility]   Physical Activity    On average, how many minutes do you engage in exercise at this level?: 2  Stress: Not at Risk (05/28/2024)   Received  from [facility]   Stress    Do you feel these kinds of stress these days?: 1  Social Connections: Not at Risk (05/28/2024)   Received from [facility]   Social Connections    How often do you see or talk to people that you care about and feel close to? (For example: talking to friends on phone, visiting friends or family, going to church or club meetings): 1     Family History: The patient's family history includes Hypertension in his brother, father, and mother. ROS:   Please see the history of present illness.    All 14 point review of systems negative except as described per history of present illness.  EKGs/Labs/Other Studies Reviewed:    The following studies were reviewed today:   EKG:  EKG Interpretation Date/Time:  Thursday July 26 2024 15:33:11 EDT Ventricular Rate:  75 PR Interval:  156 QRS Duration:  78 QT Interval:  448 QTC Calculation: 500 R Axis:   64  Text Interpretation: Sinus rhythm with Premature atrial complexes Prolonged QT When compared with ECG of 14-Sep-2023 08:35, Premature atrial complexes are now Present Minimal criteria for Inferior infarct are no longer Present Confirmed by Liborio Hai reddy [phone]) on 07/26/2024 3:56:16 PM     Recent Labs: 09/13/2023: ALT 36; TSH 1.708 09/14/2023: Hemoglobin 11.6; Platelets 184 09/17/2023: BUN 14; Creatinine, Ser 1.00; Potassium 3.3; Sodium 141  Recent Lipid Panel    Component Value Date/Time   CHOL 197 09/26/2019 0224   TRIG 81 09/26/2019 0224   HDL 49 09/26/2019 0224   CHOLHDL 4.0 09/26/2019 0224   VLDL 16 09/26/2019 0224   LDLCALC 132 (H) 09/26/2019 0224    Physical Exam:    VS:  BP 116/84   Pulse 75   Ht 5' 8 (1.727 m)   Wt 150 lb 6.4 oz (68.2 kg)   SpO2 99%   BMI 22.87 kg/m     Wt Readings from Last 3 Encounters:  07/26/24 150 lb 6.4 oz (68.2 kg)  09/13/23 165 lb (74.8 kg)  12/15/19 156 lb 12 oz (71.1 kg)     GENERAL:  Well nourished, well developed in no acute distress NECK: No JVD; No  carotid bruits CARDIAC: RRR, S1 and S2 present, no murmurs, no rubs, no gallops CHEST:  Clear to auscultation without rales, wheezing or rhonchi  Extremities: No pitting pedal edema. Pulses bilaterally symmetric with radial 2+ and dorsalis pedis 2+ NEUROLOGIC:  Alert and oriented  x 3  Medication Adjustments/Labs and Tests Ordered: Current medicines are reviewed at length with the patient today.  Concerns regarding medicines are outlined above.  Orders Placed This Encounter  Procedures   LONG TERM MONITOR (3-14 DAYS)   EKG 12-Lead   No orders of the defined types were placed in this encounter.   Signed, Alean jess Kobus, MD, MPH, [facility]. 07/26/2024 4:39 PM    [facility]

## 2024-07-26 NOTE — Assessment & Plan Note (Signed)
 Couple episodes of atrial fibrillation in the past when acutely ill with alcohol  intoxication in the past year. Last episode February 2025 while admitted at [facility]. Currently remains in sinus rhythm. Asymptomatic.  CHA2DS2-VASc score 1 [hypertension]. Not on anticoagulation due to high risk for bleeding in the setting of alcohol  abuse, nonadherence and fall risk. He has since remained abstinent from alcohol , has had good adherence with his ART managing his medications and has not sustained any falls in the past 6 months. However his overall CHA2DS2-VASc score stroke risk is small. A-fib episodes were likely in the setting of acute alcoholic intoxication.  Will assess overall burden of A-fib with a 14-day Zio patch. Will await evaluation by liver specialist in the context of his chronic hepatitis C and chronic alcohol  abuse history, anticipate he will undergo EGD.  If he does have significant A-fib burden over recommend anticoagulation so long as his bleeding risk is considered to be low.

## 2024-08-22 ENCOUNTER — Ambulatory Visit: Payer: Self-pay

## 2024-08-22 DIAGNOSIS — I4891 Unspecified atrial fibrillation: Secondary | ICD-10-CM | POA: Diagnosis not present

## 2024-08-22 NOTE — Progress Notes (Signed)
 Please inform him monitor results show no evidence of atrial fibrillation. He does have frequent extra beats occurring from top chambers of the heart [13.9%]. Continue with metoprolol  succinate 25 mg once daily. Also recommend him to avoid smoking if he is continuing to do so. Thank you

## 2024-10-15 ENCOUNTER — Ambulatory Visit: Admitting: Neurology

## 2024-10-15 ENCOUNTER — Telehealth: Payer: Self-pay | Admitting: Neurology

## 2024-10-15 NOTE — Telephone Encounter (Signed)
 Request made to cx, Aunt said no longer needed

## 2024-10-25 ENCOUNTER — Ambulatory Visit
